# Patient Record
Sex: Male | Born: 1965
Health system: Southern US, Community
[De-identification: ages and names within clinical notes are randomized; demographics above are authoritative.]

## PROBLEM LIST (undated history)

## (undated) DIAGNOSIS — F419 Anxiety disorder, unspecified: Secondary | ICD-10-CM

## (undated) DIAGNOSIS — Z720 Tobacco use: Secondary | ICD-10-CM

## (undated) DIAGNOSIS — E559 Vitamin D deficiency, unspecified: Secondary | ICD-10-CM

## (undated) DIAGNOSIS — I639 Cerebral infarction, unspecified: Secondary | ICD-10-CM

## (undated) HISTORY — DX: Tobacco use: Z72.0

## (undated) HISTORY — DX: Vitamin D deficiency, unspecified: E55.9

## (undated) HISTORY — DX: Anxiety disorder, unspecified: F41.9

## (undated) HISTORY — DX: Cerebral infarction, unspecified: I63.9

## (undated) HISTORY — PX: FOOT SURGERY: SHX648

---

## 2003-10-25 ENCOUNTER — Emergency Department (HOSPITAL_COMMUNITY): Admission: EM | Admit: 2003-10-25 | Discharge: 2003-10-25 | Payer: Self-pay | Admitting: *Deleted

## 2003-12-03 ENCOUNTER — Emergency Department (HOSPITAL_COMMUNITY): Admission: EM | Admit: 2003-12-03 | Discharge: 2003-12-03 | Payer: Self-pay | Admitting: Emergency Medicine

## 2006-12-07 ENCOUNTER — Emergency Department (HOSPITAL_COMMUNITY): Admission: EM | Admit: 2006-12-07 | Discharge: 2006-12-07 | Payer: Self-pay | Admitting: Emergency Medicine

## 2011-05-05 ENCOUNTER — Emergency Department (HOSPITAL_COMMUNITY)
Admission: EM | Admit: 2011-05-05 | Discharge: 2011-05-05 | Disposition: A | Discharge status: Home / Self Care | Payer: Self-pay | Attending: Emergency Medicine | Admitting: Emergency Medicine

## 2011-05-05 DIAGNOSIS — X58XXXA Exposure to other specified factors, initial encounter: Secondary | ICD-10-CM | POA: Insufficient documentation

## 2011-05-05 DIAGNOSIS — T148XXA Other injury of unspecified body region, initial encounter: Secondary | ICD-10-CM | POA: Insufficient documentation

## 2011-05-05 DIAGNOSIS — R109 Unspecified abdominal pain: Secondary | ICD-10-CM | POA: Insufficient documentation

## 2011-05-05 LAB — URINALYSIS, ROUTINE W REFLEX MICROSCOPIC
Bilirubin Urine: NEGATIVE
Glucose, UA: NEGATIVE mg/dL
Hgb urine dipstick: NEGATIVE
Ketones, ur: NEGATIVE mg/dL
Leukocytes, UA: NEGATIVE
Nitrite: NEGATIVE
Protein, ur: NEGATIVE mg/dL
Specific Gravity, Urine: 1.011 (ref 1.005–1.030)
Urobilinogen, UA: 1 mg/dL (ref 0.0–1.0)
pH: 6 (ref 5.0–8.0)

## 2017-04-01 DIAGNOSIS — Z9889 Other specified postprocedural states: Secondary | ICD-10-CM

## 2017-04-01 HISTORY — DX: Other specified postprocedural states: Z98.890

## 2017-04-11 ENCOUNTER — Emergency Department (HOSPITAL_COMMUNITY): Payer: Self-pay

## 2017-04-11 ENCOUNTER — Inpatient Hospital Stay (HOSPITAL_COMMUNITY): Payer: Self-pay

## 2017-04-11 ENCOUNTER — Encounter (HOSPITAL_COMMUNITY): Payer: Self-pay

## 2017-04-11 ENCOUNTER — Inpatient Hospital Stay (HOSPITAL_COMMUNITY)
Admission: EM | Admit: 2017-04-11 | Discharge: 2017-04-13 | DRG: 042 | Disposition: A | Discharge status: Home / Self Care | Payer: Self-pay | Attending: Internal Medicine | Admitting: Internal Medicine

## 2017-04-11 DIAGNOSIS — F17210 Nicotine dependence, cigarettes, uncomplicated: Secondary | ICD-10-CM | POA: Diagnosis present

## 2017-04-11 DIAGNOSIS — E876 Hypokalemia: Secondary | ICD-10-CM | POA: Diagnosis present

## 2017-04-11 DIAGNOSIS — H538 Other visual disturbances: Secondary | ICD-10-CM | POA: Diagnosis present

## 2017-04-11 DIAGNOSIS — I779 Disorder of arteries and arterioles, unspecified: Secondary | ICD-10-CM | POA: Diagnosis present

## 2017-04-11 DIAGNOSIS — I6340 Cerebral infarction due to embolism of unspecified cerebral artery: Principal | ICD-10-CM | POA: Diagnosis present

## 2017-04-11 DIAGNOSIS — I739 Peripheral vascular disease, unspecified: Secondary | ICD-10-CM | POA: Diagnosis present

## 2017-04-11 DIAGNOSIS — Z720 Tobacco use: Secondary | ICD-10-CM | POA: Diagnosis present

## 2017-04-11 DIAGNOSIS — I6522 Occlusion and stenosis of left carotid artery: Secondary | ICD-10-CM | POA: Diagnosis present

## 2017-04-11 DIAGNOSIS — I6310 Cerebral infarction due to embolism of unspecified precerebral artery: Secondary | ICD-10-CM

## 2017-04-11 DIAGNOSIS — E785 Hyperlipidemia, unspecified: Secondary | ICD-10-CM | POA: Diagnosis present

## 2017-04-11 DIAGNOSIS — R200 Anesthesia of skin: Secondary | ICD-10-CM | POA: Diagnosis present

## 2017-04-11 DIAGNOSIS — I639 Cerebral infarction, unspecified: Secondary | ICD-10-CM | POA: Diagnosis present

## 2017-04-11 DIAGNOSIS — Z8249 Family history of ischemic heart disease and other diseases of the circulatory system: Secondary | ICD-10-CM

## 2017-04-11 DIAGNOSIS — Z833 Family history of diabetes mellitus: Secondary | ICD-10-CM

## 2017-04-11 DIAGNOSIS — Z8661 Personal history of infections of the central nervous system: Secondary | ICD-10-CM

## 2017-04-11 LAB — BASIC METABOLIC PANEL
Anion gap: 9 (ref 5–15)
BUN: 10 mg/dL (ref 6–20)
CHLORIDE: 101 mmol/L (ref 101–111)
CO2: 27 mmol/L (ref 22–32)
CREATININE: 1.18 mg/dL (ref 0.61–1.24)
Calcium: 8.6 mg/dL — ABNORMAL LOW (ref 8.9–10.3)
GFR calc Af Amer: >60 mL/min (ref >60)
GFR calc non Af Amer: >60 mL/min (ref >60)
Glucose, Bld: 71 mg/dL (ref 65–99)
Potassium: 3.3 mmol/L — ABNORMAL LOW (ref 3.5–5.1)
SODIUM: 137 mmol/L (ref 135–145)

## 2017-04-11 LAB — CBC WITH DIFFERENTIAL/PLATELET
Basophils Absolute: 0 10*3/uL (ref 0.0–0.1)
Basophils Relative: 0 %
EOS ABS: 0.1 10*3/uL (ref 0.0–0.7)
EOS PCT: 1 %
HCT: 39.8 % (ref 39.0–52.0)
Hemoglobin: 14.4 g/dL (ref 13.0–17.0)
LYMPHS ABS: 1.8 10*3/uL (ref 0.7–4.0)
Lymphocytes Relative: 24 %
MCH: 30.4 pg (ref 26.0–34.0)
MCHC: 36.2 g/dL — ABNORMAL HIGH (ref 30.0–36.0)
MCV: 84.1 fL (ref 78.0–100.0)
MONO ABS: 0.3 10*3/uL (ref 0.1–1.0)
MONOS PCT: 4 %
Neutro Abs: 5.2 10*3/uL (ref 1.7–7.7)
Neutrophils Relative %: 71 %
PLATELETS: 177 10*3/uL (ref 150–400)
RBC: 4.73 MIL/uL (ref 4.22–5.81)
RDW: 13.7 % (ref 11.5–15.5)
WBC: 7.4 10*3/uL (ref 4.0–10.5)

## 2017-04-11 MED ORDER — ACETAMINOPHEN 650 MG RE SUPP: 650.0000 mg | RECTAL | Status: DC | PRN

## 2017-04-11 MED ORDER — POTASSIUM CHLORIDE 10 MEQ/100ML IV SOLN
10.0000 meq | INTRAVENOUS | Status: AC
  Administered 2017-04-11 (×3): 10 meq via INTRAVENOUS
  Filled 2017-04-11 (×3): qty 100

## 2017-04-11 MED ORDER — ACETAMINOPHEN 160 MG/5ML PO SOLN: 650.0000 mg | ORAL | Status: DC | PRN

## 2017-04-11 MED ORDER — SODIUM CHLORIDE 0.9 % IV SOLN
INTRAVENOUS | Status: DC
  Administered 2017-04-11: 17:00:00 via INTRAVENOUS

## 2017-04-11 MED ORDER — STROKE: EARLY STAGES OF RECOVERY BOOK
Freq: Once | Status: DC
Start: 2017-04-11 — End: 2017-04-13

## 2017-04-11 MED ORDER — ACETAMINOPHEN 325 MG PO TABS: 650.0000 mg | ORAL_TABLET | ORAL | Status: DC | PRN

## 2017-04-11 MED ORDER — SENNOSIDES-DOCUSATE SODIUM 8.6-50 MG PO TABS
1.0000 | ORAL_TABLET | Freq: Every evening | ORAL | Status: DC | PRN
Start: 2017-04-11 — End: 2017-04-13

## 2017-04-11 MED ORDER — ASPIRIN EC 81 MG PO TBEC
81.0000 mg | DELAYED_RELEASE_TABLET | Freq: Every day | ORAL | Status: DC
  Administered 2017-04-11 – 2017-04-13 (×3): 81 mg via ORAL
  Filled 2017-04-11 (×3): qty 1

## 2017-04-11 NOTE — Progress Notes (Signed)
PT Cancellation Note  Patient Details Name: Daniel Stuart MRN: [id number] DOB: 1966-04-13   Cancelled Treatment:    Reason Eval/Treat Not Completed: Patient at procedure or test/unavailable.  Attempted to see patient x2 today.  In testing and RN working with patient.  Will return tomorrow for PT evaluation.   Vena AustriaSusan H Raveena Hebdon 04/11/2017, 8:44 PM Durenda HurtSusan H. Renaldo Fiddleravis, PT, [facility] Pager [phone]

## 2017-04-11 NOTE — Progress Notes (Signed)
OT Cancellation Note  Patient Details Name: Daniel Stuart MRN: [id number] DOB: 02/10/1966   Cancelled Treatment:    Reason Eval/Treat Not Completed: Patient at procedure or test/ unavailable. On OT arrival for evaluation, pt preparing to leave unit for testing. OT will check back as able for OT evaluation.   Doristine Sectionharity A Rob Mciver, MS OTR/L  Pager: [phone]   Doristine SectionCharity A Zak Gondek 04/11/2017, 5:16 PM

## 2017-04-11 NOTE — Consult Note (Signed)
Referring Physician: Dr. Charlies Silvers    Chief Complaint: Multifocal strokes on MRI  HPI: Daniel Stuart is an 51 y.o. male who initially presented to the [facility] ED for assessment of worsening headache, RUE numbness and intermittent blurry vision. His headache has been off and on for the past 2 months, and is present over the top of his head, rated at 6/10. He has had some trouble walking, which he attributes to RLE weakness. He also endorses dizziness that is non-vertiginous, feeling more like a sensation of lightheadedness. He denies confusion or difficulty with speech.   MRI revealed multifocal subcentimeter foci of restricted diffusion scattered within the cerebral hemispheres; the lesions are present at the cortical-juxtacortical interface, consistent with microemboli. Also seen on MRI is extensive diffuse T2 and FLAIR-hyperintense signal abnormality that appears chronic. 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 Also noted on MRI is chronic left ICA occlusion - a portion of the signal change on the MRI is felt by Radiology to represent old watershed infarctions.   He denies recent fevers, chills, rigors or other symptoms of infection.   He states that he has not seen a doctor in about 30 years. His only home medication is PRN ibuprofen.   History reviewed. No pertinent past medical history.  Past Surgical History:  Procedure Laterality Date  . FOOT SURGERY      Family History  Problem Relation Age of Onset  . Diabetes Mother   . Diabetes Father   . Heart failure Father    Social History:  reports that he has been smoking Cigarettes.  He has been smoking about 1.00 pack per day. He has never used smokeless tobacco. He reports that he does not drink alcohol or use drugs.  Allergies: No Known  Allergies  Medications:  Prior to Admission:  Prescriptions Prior to Admission  Medication Sig Dispense Refill Last Dose  . ibuprofen (ADVIL,MOTRIN) 200 MG tablet Take 400 mg by mouth every 6 (six) hours as needed.   04/10/2017 at Unknown time    ROS: No CP, SOB, abdominal pain. Denies yellowing of eyes. Denies hematuria. Other ROS as per HPI.   Physical Examination: Blood pressure 124/79, pulse 71, temperature 98 F (36.7 C), temperature source Oral, resp. rate 16, height 5' 6"  (1.676 m), weight 59 kg (130 lb), SpO2 99 %.  General: NAD.  HEENT: [place]/AT Ext: No edema  Neurologic Examination: Mental Status: Alert, fully oriented, thought content appropriate.  Pleasant and cooperative. Speech fluent without evidence of aphasia.  Had some difficulty with a directional 2-step command.  Cranial Nerves: II:  Visual fields intact, PERRL III,IV, VI: ptosis not present, EOMI without nystagmus V,VII: smile symmetric, facial temp sensation normal bilaterally VIII: hearing intact to conversation IX,X: palate rises symmetrically XI: Shoulder shrug is symmetric XII: midline tongue extension Motor: Right : Upper extremity   5/5    Left:     Upper extremity   5/5  Lower extremity   5/5     Lower extremity   5/5 Normal tone in upper extremities. Mildly increased tone in lower extremities. No atrophy noted Sensory: Temperature and light touch intact x 4. No extinction.  Deep Tendon Reflexes:  3+ bilateral upper extremities. 4+ left patella, 3+ right patella, 2+ achilles bilaterally Plantars: Equivocal bilaterally.  Cerebellar: No ataxia with FNF or  heel-shin bilaterally.  Gait: Deferred  Results for orders placed or performed during the hospital encounter of 04/11/17 (from the past 48 hour(s))  CBC with Differential/Platelet     Status: Abnormal   Collection Time: 04/11/17 10:16 AM  Result Value Ref Range   WBC 7.4 4.0 - 10.5 K/uL   RBC 4.73 4.22 - 5.81 MIL/uL   Hemoglobin 14.4 13.0 - 17.0  g/dL   HCT 39.8 39.0 - 52.0 %   MCV 84.1 78.0 - 100.0 fL   MCH 30.4 26.0 - 34.0 pg   MCHC 36.2 (H) 30.0 - 36.0 g/dL   RDW 13.7 11.5 - 15.5 %   Platelets 177 150 - 400 K/uL   Neutrophils Relative % 71 %   Neutro Abs 5.2 1.7 - 7.7 K/uL   Lymphocytes Relative 24 %   Lymphs Abs 1.8 0.7 - 4.0 K/uL   Monocytes Relative 4 %   Monocytes Absolute 0.3 0.1 - 1.0 K/uL   Eosinophils Relative 1 %   Eosinophils Absolute 0.1 0.0 - 0.7 K/uL   Basophils Relative 0 %   Basophils Absolute 0.0 0.0 - 0.1 K/uL  Basic metabolic panel     Status: Abnormal   Collection Time: 04/11/17 10:16 AM  Result Value Ref Range   Sodium 137 135 - 145 mmol/L   Potassium 3.3 (L) 3.5 - 5.1 mmol/L   Chloride 101 101 - 111 mmol/L   CO2 27 22 - 32 mmol/L   Glucose, Bld 71 65 - 99 mg/dL   BUN 10 6 - 20 mg/dL   Creatinine, Ser 1.18 0.61 - 1.24 mg/dL   Calcium 8.6 (L) 8.9 - 10.3 mg/dL   GFR calc non Af Amer >60 >60 mL/min   GFR calc Af Amer >60 >60 mL/min    Comment: (NOTE) The eGFR has been calculated using the CKD EPI equation. This calculation has not been validated in all clinical situations. eGFR's persistently <60 mL/min signify possible Chronic Kidney Disease.    Anion gap 9 5 - 15   Dg Chest 2 View  Result Date: 04/11/2017 CLINICAL DATA:  Code stroke EXAM: CHEST  2 VIEW COMPARISON:  None in PACs FINDINGS: The lungs are well-expanded. There is no focal infiltrate. There is no pleural effusion. The heart and pulmonary vascularity are normal. The mediastinum is normal in width. The trachea is midline. The bony thorax is unremarkable. IMPRESSION: There is no acute cardiopulmonary abnormality. Electronically Signed   By: David  Martinique M.D.   On: 04/11/2017 16:26   Mr Brain Wo Contrast (neuro Protocol)  Result Date: 04/11/2017 CLINICAL DATA:  Headache.  Right-sided numbness EXAM: MRI HEAD WITHOUT CONTRAST TECHNIQUE: Multiplanar, multiecho pulse sequences of the brain and surrounding structures were obtained without  intravenous contrast. COMPARISON:  None. FINDINGS: Brain: Multiple small areas of restricted diffusion in both cerebral hemispheres compatible with acute infarcts, likely embolic. These areas all measure under 1 cm. No infarct in the brainstem or cerebellum. Chronic infarcts in the frontal and parietal cortex and white matter bilaterally, with the appearance of watershed chronic infarction. Chronic infarcts in the occipital lobes bilaterally and bilateral cerebral white matter chronic infarcts. Posterior fossa normal. Negative for hemorrhage or mass. Ventricle size normal. No shift of the midline structures Vascular: Occlusion of the left internal carotid artery of unknown chronicity. Right internal carotid artery and basilar artery have normal flow voids. Skull and upper cervical spine: Negative Sinuses/Orbits: Negative Other: None IMPRESSION: Multiple small areas of acute infarct in both cerebral hemisphere most  likely due to emboli Occlusion of left internal carotid artery. Extensive chronic ischemia bilaterally. Probable chronic watershed infarct in both cerebral hemispheres. Electronically Signed   By: Franchot Gallo M.D.   On: 04/11/2017 12:25    Assessment: 51 y.o. male with probable multifocal acute cardioembolic strokes 1. Acute findings on MRI: Multifocal subcentimeter foci of restricted diffusion scattered within the cerebral hemispheres; the lesions are present at the cortical-juxtacortical interfaces, consistent with microemboli. A cardiac source is suspected. 2. Chronic findings on MRI: Extensive diffuse T2 and FLAIR-hyperintense signal abnormality that appears chronic. 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  3. Also noted on MRI is chronic left ICA occlusion - a portion of the signal change on the MRI is felt by Radiology to represent old watershed infarctions.   Plan: 1. Blood cultures x 2.  2. TTE  to assess for possible vegetation. If negative, obtain TEE.  3, MRA of the brain without contrast 4. MRA of neck with contrast.  5. PT consult, OT consult, Speech consult 6. Start ASA 81 mg po qd.  7. Given probable cardioembolic source for strokes, hold off on statin unless fasting lipid panel is abnormal.  8. Given the small size and probable mechanism of the infarctions, permissive HTN not likely to be of benefit.  9. Telemetry monitoring 10. Frequent neuro checks  @Electronically  signed: Dr. Kerney Elbe  04/11/2017, 7:16 PM

## 2017-04-11 NOTE — ED Provider Notes (Addendum)
[facility]-EMERGENCY DEPT Provider Note   CSN: [id number] Arrival date & time: 04/11/17  0848     History   Chief Complaint Chief Complaint  Patient presents with  . Migraine  . arm numbness    HPI Daniel Stuart is a 51 y.o. male.  Patient presents with the complaint of headache on top of his head as well as some visual changes some right arm numbness some left leg numbness and some coordination issues particularly with walking the symptoms have been present for 3 months. Headache is mild not severe. Patient seen by primary care provider for this. And they were planning MRI. Patient without any symptoms like this in the past.      History reviewed. No pertinent past medical history.  Patient Active Problem List   Diagnosis Date Noted  . CVA (cerebral vascular accident) (HCC) 04/11/2017    Past Surgical History:  Procedure Laterality Date  . FOOT SURGERY         Home Medications    Prior to Admission medications   Medication Sig Start Date End Date Taking? Authorizing Provider  ibuprofen (ADVIL,MOTRIN) 200 MG tablet Take 400 mg by mouth every 6 (six) hours as needed.   Yes [provider]    Family History Family History  Problem Relation Age of Onset  . Diabetes Mother   . Diabetes Father   . Heart failure Father     Social History Social History  Substance Use Topics  . Smoking status: Current Every Day Smoker    Packs/day: 1.00    Types: Cigarettes  . Smokeless tobacco: Never Used  . Alcohol use No     Allergies   Patient has no known allergies.   Review of Systems Review of Systems  Constitutional: Negative for fever.  HENT: Negative for congestion.   Eyes: Positive for visual disturbance.  Respiratory: Negative for shortness of breath.   Cardiovascular: Negative for chest pain.  Gastrointestinal: Negative for abdominal pain and nausea.  Genitourinary: Negative for dysuria.  Musculoskeletal: Negative for back pain.  Skin:  Positive for rash.  Neurological: Positive for speech difficulty, numbness and headaches.  Hematological: Does not bruise/bleed easily.  Psychiatric/Behavioral: Negative for confusion.     Physical Exam Updated Vital Signs BP 114/78   Pulse 60   Temp 97.9 F (36.6 C)   Resp 16   Ht 1.676 m (5\' 6" )   Wt 59 kg (130 lb)   SpO2 100%   BMI 20.98 kg/m   Physical Exam  Constitutional: He is oriented to person, place, and time. He appears well-developed and well-nourished. No distress.  HENT:  Head: Normocephalic and atraumatic.  Mouth/Throat: Oropharynx is clear and moist.  Eyes: Conjunctivae and EOM are normal. Pupils are equal, round, and reactive to light.  Neck: Normal range of motion. Neck supple.  Cardiovascular: Normal rate and regular rhythm.   Pulmonary/Chest: Effort normal and breath sounds normal. No respiratory distress.  Abdominal: Soft. Bowel sounds are normal. There is no tenderness.  Musculoskeletal: Normal range of motion. He exhibits no edema.  Neurological: He is alert and oriented to person, place, and time. No cranial nerve deficit or sensory deficit. He exhibits normal muscle tone.  Coordination slightly off.  Skin: Skin is warm.  Nursing note and vitals reviewed.    ED Treatments / Results  Labs (all labs ordered are listed, but only abnormal results are displayed) Labs Reviewed  CBC WITH DIFFERENTIAL/PLATELET - Abnormal; Notable for the following:  Result Value   MCHC 36.2 (*)    All other components within normal limits  BASIC METABOLIC PANEL - Abnormal; Notable for the following:    Potassium 3.3 (*)    Calcium 8.6 (*)    All other components within normal limits    EKG  EKG Interpretation  Date/Time:  Monday April 11 2017 13:07:55 EDT Ventricular Rate:  63 PR Interval:    QRS Duration: 86 QT Interval:  374 QTC Calculation: 377 R Axis:   -2 Text Interpretation:  Sinus rhythm RSR' in V1 or V2, right VCD or RVH Minimal ST elevation,  inferior leads Baseline wander in lead(s) III aVF V4 No previous ECGs available Confirmed by Vanetta MuldersZackowski, Debbera Wolken [phone]) on 04/11/2017 1:43:08 PM       Radiology Mr Brain Wo Contrast (neuro Protocol)  Result Date: 04/11/2017 CLINICAL DATA:  Headache.  Right-sided numbness EXAM: MRI HEAD WITHOUT CONTRAST TECHNIQUE: Multiplanar, multiecho pulse sequences of the brain and surrounding structures were obtained without intravenous contrast. COMPARISON:  None. FINDINGS: Brain: Multiple small areas of restricted diffusion in both cerebral hemispheres compatible with acute infarcts, likely embolic. These areas all measure under 1 cm. No infarct in the brainstem or cerebellum. Chronic infarcts in the frontal and parietal cortex and white matter bilaterally, with the appearance of watershed chronic infarction. Chronic infarcts in the occipital lobes bilaterally and bilateral cerebral white matter chronic infarcts. Posterior fossa normal. Negative for hemorrhage or mass. Ventricle size normal. No shift of the midline structures Vascular: Occlusion of the left internal carotid artery of unknown chronicity. Right internal carotid artery and basilar artery have normal flow voids. Skull and upper cervical spine: Negative Sinuses/Orbits: Negative Other: None IMPRESSION: Multiple small areas of acute infarct in both cerebral hemisphere most likely due to emboli Occlusion of left internal carotid artery. Extensive chronic ischemia bilaterally. Probable chronic watershed infarct in both cerebral hemispheres. Electronically Signed   By: Marlan Palauharles  Clark M.D.   On: 04/11/2017 12:25    Procedures Procedures (including critical care time)  Medications Ordered in ED Medications  potassium chloride 10 mEq in 100 mL IVPB (10 mEq Intravenous Transfusing/Transfer 04/11/17 1329)     Initial Impression / Assessment and Plan / ED Course  I have reviewed the triage vital signs and the nursing notes.  Pertinent labs & imaging results  that were available during my care of the patient were reviewed by me and considered in my medical decision making (see chart for details).     Patient's MRI shows evidence of old blood embolic acute infarcts. Discussed with the neurologist at [facility] they prefer patient to be transferred there. Hospitalist will arrange transfer and admission to the hospitalist team. Neurology will be available if needed.  Patient symptoms are several months long however MRI does show evidence of acute infarcts consistent with embolic phenomenon.  Final Clinical Impressions(s) / ED Diagnoses   Final diagnoses:  Cerebrovascular accident (CVA) due to embolism of precerebral artery [facility](HCC)    New Prescriptions New Prescriptions   No medications on file     Vanetta MuldersZackowski, Joesph Marcy, MD 04/11/17 1327    Vanetta MuldersZackowski, Nehal Witting, MD 04/11/17 1343

## 2017-04-11 NOTE — ED Triage Notes (Signed)
Patient c/o migraine headache on the top of his head that started yesterday.Patient denies blurred vision, N/V, and denies sensitivity to light. Patient also reports that he has been having intermittent numbness to the right arm x several weeks. Patient states sometimes when he straightens the arm out it jumps.  Patient added that he has been having issues with equilibrium and his PCP recommended an MRI, but his insurance does nt cover the MRI.

## 2017-04-11 NOTE — ED Notes (Signed)
Patient in MRI 

## 2017-04-11 NOTE — Progress Notes (Signed)
VASCULAR LAB PRELIMINARY  PRELIMINARY  PRELIMINARY  PRELIMINARY  Carotid duplex completed.    Preliminary report:  Right - No evidence of ICA stenosis. Vertebral artery flow is antegrade.. Left - Abnormal Doppler signal ( spiked ) in the bulb with loss of color flow and Doppler signal beyond that point. No plaque or embolus visualized. Cannot rule out a possible kink.  Courtny Bennison, RVS 04/11/2017, 5:03 PM Olen CordialGreg Collins, RVT 04/11/2017 5:03 PM

## 2017-04-11 NOTE — H&P (Signed)
History and Physical    Daniel Earthlyimothy E Sokol ZOX:[id number] DOB: 11-15-1965 DOA: 04/11/2017  Referring MD/NP/PA: Dr. Deretha EmoryZackowski  PCP: Dolan AmenBailey, Sarah M, [name]    Patient coming from: home   Chief Complaint: arm numbness, leg numbness  HPI: Daniel Stuart is a 51 y.o. male with no significant medical history, not on any medications. Patient presented to ED for evaluation of worsening headache, intermittent visual changes and numbness in his right arm. Patient reported his symptoms for several weeks to few months and thought that symptoms would resolve on their own however symptoms never subsided. Headache is located on top of the head and it is about 6 out of 10 in intensity, nonradiating and sometimes he gets blurry vision. Patient is taking ibuprofen as needed, this has not provided symptomatic relief. No associated auras. In regards to arm numbness patient reports this is still present and he also has intermittent numbness in his left leg which cause him to have little trouble with walking. No lightheadedness or loss of consciousness. No dizziness. No falls. No chest pain or shortness of breath or palpitations. No abdominal pain, nausea or vomiting. No urinary complaints.  ED Course: In ED, patient was hemodynamically stable. Blood work was relatively unremarkable except for potassium of 3.3 which was supplemented in ED. Chest x-ray showed no acute cardiopulmonary findings. MRI brain showed multiple small areas of acute infarct in cerebral hemispheres most likely due to emboli and occlusion of the left internal carotid artery. Per neurology, [facility].  Review of Systems:  Constitutional: Negative for fever, chills, diaphoresis, activity change, appetite change and fatigue.  HENT: Negative for ear pain, nosebleeds, congestion, facial swelling, rhinorrhea, neck pain, neck stiffness and ear discharge.   Eyes: Negative for pain, discharge, redness, itching  and visual disturbance.  Respiratory: Negative for cough, choking, chest tightness, shortness of breath, wheezing and stridor.   Cardiovascular: Negative for chest pain, palpitations and leg swelling.  Gastrointestinal: Negative for abdominal distention.  Genitourinary: Negative for dysuria, urgency, frequency, hematuria, flank pain, decreased urine volume, difficulty urinating and dyspareunia.  Musculoskeletal: Negative for back pain, joint swelling, arthralgias and gait problem.  Neurological: per HPI Hematological: Negative for adenopathy. Does not bruise/bleed easily.  Psychiatric/Behavioral: Negative for hallucinations, behavioral problems, confusion, dysphoric mood, decreased concentration and agitation.   History reviewed. No pertinent past medical history.  Past Surgical History:  Procedure Laterality Date  . FOOT SURGERY      Social history:  reports that he has been smoking Cigarettes.  He has been smoking about 1.00 pack per day. He has never used smokeless tobacco. He reports that he does not drink alcohol or use drugs.  Ambulation: Ambulates without assistance at baseline.  No Known Allergies  Family History  Problem Relation Age of Onset  . Diabetes Mother   . Diabetes Father   . Heart failure Father     Prior to Admission medications   Medication Sig Start Date End Date Taking? Authorizing Provider  ibuprofen (ADVIL,MOTRIN) 200 MG tablet Take 400 mg by mouth every 6 (six) hours as needed.   Yes [provider]    Physical Exam: Vitals:   04/11/17 0855 04/11/17 0911 04/11/17 1217  BP: 113/81  114/78  Pulse: 89  60  Resp: 18  16  Temp: 97.9 F (36.6 C)    SpO2: 98%  100%  Weight:  59 kg (130 lb)   Height:  5\' 6"  (1.676 m)  Constitutional: NAD, calm, comfortable Vitals:   04/11/17 0855 04/11/17 0911 04/11/17 1217  BP: 113/81  114/78  Pulse: 89  60  Resp: 18  16  Temp: 97.9 F (36.6 C)    SpO2: 98%  100%  Weight:  59 kg (130 lb)     Height:  5\' 6"  (1.676 m)    Eyes: PERRL, lids and conjunctivae normal ENMT: Mucous membranes are moist. Posterior pharynx clear of any exudate or lesions.Normal dentition.  Neck: normal, supple, no masses, no thyromegaly Respiratory: clear to auscultation bilaterally, no wheezing, no crackles. Normal respiratory effort. No accessory muscle use.  Cardiovascular: Regular rate and rhythm, no murmurs / rubs / gallops. No extremity edema. 2+ pedal pulses. No carotid bruits.  Abdomen: no tenderness, no masses palpated. No hepatosplenomegaly. Bowel sounds positive.  Musculoskeletal: no clubbing / cyanosis. No joint deformity upper and lower extremities. Good ROM, no contractures. Normal muscle tone.  Skin: no rashes, lesions, ulcers. No induration Neurologic: Alert and oriented to time, place and person, no cranial nerve deficit. Psychiatric: Normal judgment and insight. Alert and oriented x 3. Normal mood.   Labs on Admission: I have personally reviewed following labs and imaging studies  CBC:  Recent Labs Lab 04/11/17 1016  WBC 7.4  NEUTROABS 5.2  HGB 14.4  HCT 39.8  MCV 84.1  PLT 177   Basic Metabolic Panel:  Recent Labs Lab 04/11/17 1016  NA 137  K 3.3*  CL 101  CO2 27  GLUCOSE 71  BUN 10  CREATININE 1.18  CALCIUM 8.6*   GFR: Estimated Creatinine Clearance: 61.1 mL/min (by C-G formula based on SCr of 1.18 mg/dL). Liver Function Tests: No results for input(s): AST, ALT, ALKPHOS, BILITOT, PROT, ALBUMIN in the last 168 hours. No results for input(s): LIPASE, AMYLASE in the last 168 hours. No results for input(s): AMMONIA in the last 168 hours. Coagulation Profile: No results for input(s): INR, PROTIME in the last 168 hours. Cardiac Enzymes: No results for input(s): CKTOTAL, CKMB, CKMBINDEX, TROPONINI in the last 168 hours. BNP (last 3 results) No results for input(s): PROBNP in the last 8760 hours. HbA1C: No results for input(s): HGBA1C in the last 72  hours. CBG: No results for input(s): GLUCAP in the last 168 hours. Lipid Profile: No results for input(s): CHOL, HDL, LDLCALC, TRIG, CHOLHDL, LDLDIRECT in the last 72 hours. Thyroid Function Tests: No results for input(s): TSH, T4TOTAL, FREET4, T3FREE, THYROIDAB in the last 72 hours. Anemia Panel: No results for input(s): VITAMINB12, FOLATE, FERRITIN, TIBC, IRON, RETICCTPCT in the last 72 hours. Urine analysis:    Component Value Date/Time   COLORURINE YELLOW 05/05/2011 1352   APPEARANCEUR CLEAR 05/05/2011 1352   LABSPEC 1.011 05/05/2011 1352   PHURINE 6.0 05/05/2011 1352   GLUCOSEU NEGATIVE 05/05/2011 1352   HGBUR NEGATIVE 05/05/2011 1352   BILIRUBINUR NEGATIVE 05/05/2011 1352   KETONESUR NEGATIVE 05/05/2011 1352   PROTEINUR NEGATIVE 05/05/2011 1352   UROBILINOGEN 1.0 05/05/2011 1352   NITRITE NEGATIVE 05/05/2011 1352   LEUKOCYTESUR NEGATIVE 05/05/2011 1352   Sepsis Labs: @LABRCNTIP (procalcitonin:4,lacticidven:4) )No results found for this or any previous visit (from the past 240 hour(s)).   Radiological Exams on Admission: Mr Brain Wo Contrast (neuro Protocol)  Result Date: 04/11/2017 CLINICAL DATA:  Headache.  Right-sided numbness EXAM: MRI HEAD WITHOUT CONTRAST TECHNIQUE: Multiplanar, multiecho pulse sequences of the brain and surrounding structures were obtained without intravenous contrast. COMPARISON:  None. FINDINGS: Brain: Multiple small areas of restricted diffusion in both cerebral hemispheres compatible with acute infarcts,  likely embolic. These areas all measure under 1 cm. No infarct in the brainstem or cerebellum. Chronic infarcts in the frontal and parietal cortex and white matter bilaterally, with the appearance of watershed chronic infarction. Chronic infarcts in the occipital lobes bilaterally and bilateral cerebral white matter chronic infarcts. Posterior fossa normal. Negative for hemorrhage or mass. Ventricle size normal. No shift of the midline structures  Vascular: Occlusion of the left internal carotid artery of unknown chronicity. Right internal carotid artery and basilar artery have normal flow voids. Skull and upper cervical spine: Negative Sinuses/Orbits: Negative Other: None IMPRESSION: Multiple small areas of acute infarct in both cerebral hemisphere most likely due to emboli Occlusion of left internal carotid artery. Extensive chronic ischemia bilaterally. Probable chronic watershed infarct in both cerebral hemispheres. Electronically Signed   By: Marlan Palau M.D.   On: 04/11/2017 12:25    EKG: Pending  Assessment/Plan  Active Problems: Acute CVA in both cerebral hemispheres likely secondary to emboli from occlusion of the left internal carotid artery / right arm numbness / left leg numbness Stroke work up initiated:  - Aspirin daily - MRI brain - multiple small areas of acute infarct in both cerebral hemispheres most likely due to emboli occlusion of left internal carotid artery, extensive chronic ischemia bilaterally, probable chronic watershed infarct in both cerebral hemispheres - 2D ECHO - pending  - Carotid doppler - pending  - HgbA1c, Lipid panel - pending. LDL goal < 100. Patient not on statin therapy  - Diet: NPO until swallow evaluation completed  - Therapy: PT/OT - Appreciate neurology following   DVT prophylaxis: SCD's Code Status: full code Family Communication: no family at the bedside Disposition Plan: admission to telemetry to [facility]  Consults called: Neurology   Disposition plan: Further plan will depend as patient's clinical course evolves and further radiologic and laboratory data become available.    At the time of admission, it appears that the appropriate admission status for this patient is INPATIENT .Thisis judged to be reasonable and necessary in order to provide the required intensity of service to ensure the patient's safetygiven the patient presentation of acute infarct in addition to  physical exam findings, radiographic and laboratory data in the context of chronic comorbidities.  Manson Passey MD [facility] Pager [phone]  If 7PM-7AM, please contact night-coverage [email or web address] Password [facility]  04/11/2017, 2:04 PM

## 2017-04-12 ENCOUNTER — Encounter (HOSPITAL_COMMUNITY): Payer: Self-pay | Admitting: Family Medicine

## 2017-04-12 DIAGNOSIS — I779 Disorder of arteries and arterioles, unspecified: Secondary | ICD-10-CM | POA: Diagnosis present

## 2017-04-12 DIAGNOSIS — I739 Peripheral vascular disease, unspecified: Secondary | ICD-10-CM | POA: Diagnosis present

## 2017-04-12 DIAGNOSIS — I6310 Cerebral infarction due to embolism of unspecified precerebral artery: Secondary | ICD-10-CM

## 2017-04-12 DIAGNOSIS — E785 Hyperlipidemia, unspecified: Secondary | ICD-10-CM | POA: Diagnosis present

## 2017-04-12 DIAGNOSIS — Z720 Tobacco use: Secondary | ICD-10-CM | POA: Diagnosis present

## 2017-04-12 LAB — VAS US CAROTID
LCCAPDIAS: 9 cm/s
LCCAPSYS: 72 cm/s
LEFT ECA DIAS: -10 cm/s
LEFT VERTEBRAL DIAS: -11 cm/s
Left CCA dist dias: -11 cm/s
Left CCA dist sys: -75 cm/s
RIGHT ECA DIAS: -10 cm/s
RIGHT VERTEBRAL DIAS: -16 cm/s
Right CCA prox dias: 26 cm/s
Right CCA prox sys: 116 cm/s
Right cca dist sys: -62 cm/s

## 2017-04-12 LAB — LIPID PANEL
CHOLESTEROL: 191 mg/dL (ref 0–200)
HDL: 38 mg/dL — ABNORMAL LOW (ref >40)
LDL Cholesterol: 138 mg/dL — ABNORMAL HIGH (ref 0–99)
TRIGLYCERIDES: 77 mg/dL (ref <150)
Total CHOL/HDL Ratio: 5 RATIO
VLDL: 15 mg/dL (ref 0–40)

## 2017-04-12 MED ORDER — ATORVASTATIN CALCIUM 40 MG PO TABS: 40.0000 mg | ORAL_TABLET | Freq: Every day | ORAL | Status: DC

## 2017-04-12 MED ORDER — ATORVASTATIN CALCIUM 80 MG PO TABS
80.0000 mg | ORAL_TABLET | Freq: Every day | ORAL | Status: DC
  Administered 2017-04-12 – 2017-04-13 (×2): 80 mg via ORAL
  Filled 2017-04-12 (×2): qty 1

## 2017-04-12 NOTE — Evaluation (Signed)
Occupational Therapy Evaluation Patient Details Name: Daniel Stuart MRN: [id number] DOB: January 08, 1966 Today's Date: 04/12/2017    History of Present Illness Pt is a 51 y/o male with no pertinent PMH. He presents to the ED with headache and R arm numbness. MRI revealed multiple small areas of acute infarct in both cerebral hemispheres most likely due to emboli.    Clinical Impression   PTA, pt was independent with ADL and functional mobility. He currently requires overall supervision for safety with ADL. He presents with shuffling gait, poor motor planning skills, and decreased cognition impacting [facility] ability [facility] He would benefit from continued OT services while admitted to improve independence with ADL and IADL tasks. Recommend outpatient OT services for follow-up to address motor planning and cognitive deficits related to IADL participation.     Follow Up Recommendations  Outpatient OT;Supervision - Intermittent    Equipment Recommendations  Tub/shower seat    Recommendations for Other Services       Precautions / Restrictions Precautions Precautions: Fall Restrictions Weight Bearing Restrictions: No      Mobility Bed Mobility Overal bed mobility: Modified Independent                Transfers Overall transfer level: Needs assistance Equipment used: None Transfers: Sit to/from Stand Sit to Stand: Supervision         General transfer comment: Supervision for safety.     Balance Overall balance assessment: Needs assistance Sitting-balance support: Feet supported;No upper extremity supported Sitting balance-Leahy Scale: Good     Standing balance support: No upper extremity supported;During functional activity Standing balance-Leahy Scale: Fair                             ADL either performed or assessed with clinical judgement   ADL Overall ADL's : Needs assistance/impaired Eating/Feeding: Sitting;Supervision/  safety   Grooming: Supervision/safety;Standing   Upper Body Bathing: Supervision/ safety;Sitting   Lower Body Bathing: Supervison/ safety;Sit to/from stand   Upper Body Dressing : Supervision/safety;Sitting   Lower Body Dressing: Supervision/safety;Sit to/from stand   Toilet Transfer: Supervision/safety;Ambulation   Toileting- Clothing Manipulation and Hygiene: Supervision/safety;Sit to/from stand       Functional mobility during ADLs: Supervision/safety General ADL Comments: Pt with poor motor planning and shuffling gait during functional mobility. Cognitive deficits limiting participation with IADL.     Vision Baseline Vision/History: Wears glasses Wears Glasses: Reading only Patient Visual Report: No change from baseline Vision Assessment?: Yes Eye Alignment: Within Functional Limits Ocular Range of Motion: Within Functional Limits Alignment/Gaze Preference: Within Defined Limits Tracking/Visual Pursuits:  (Decreased ability to hold eye position at end ranges. ) Visual Fields: No apparent deficits Additional Comments: Pt with decreased visual attention during gaze to the R and L of midline.      Perception     Praxis Praxis Praxis tested?: Deficits Deficits: Ideomotor;Limb apraxia;Organization    Pertinent Vitals/Pain Pain Assessment: No/denies pain     Hand Dominance Right   Extremity/Trunk Assessment Upper Extremity Assessment Upper Extremity Assessment: LUE deficits/detail;RUE deficits/detail;[facility] for tasks assessed RUE Deficits / Details: Slight weakness as compared to R (4+/5 grossly). Reports periods of numbness but not present during session.  LUE Deficits / Details: Slight give with MMT with decreased ability to sustain position for extended period of time.     Lower Extremity Assessment Lower Extremity Assessment: Overall WFL for tasks assessed   Cervical / Trunk Assessment Cervical /  Trunk Assessment: Normal   Communication  Communication Communication: No difficulties   Cognition Arousal/Alertness: Awake/alert Behavior During Therapy: WFL for tasks assessed/performed Overall Cognitive Status: Impaired/Different from baseline Area of Impairment: Attention;Memory;Awareness;Problem solving                   Current Attention Level: Selective Memory: Decreased short-term memory     Awareness: Emergent Problem Solving: Slow processing;Decreased initiation;Difficulty sequencing;Requires verbal cues General Comments: Pt with difficulty problem solving need to use map during simple pathfinding activity in hallway. As distractions increased, attention broke down causing difficulty with higher level processing tasks.    General Comments       Exercises     Shoulder Instructions      Home Living Family/patient expects to be discharged to:: Private residence Living Arrangements: Parent Available Help at Discharge: Family;Available PRN/intermittently (Mother works for 4 hours during the day) Type of Home: Apartment Home Access: Stairs to enter Secretary/administrator of Steps: 3 Entrance Stairs-Rails: Right Home Layout: One level     Bathroom Shower/Tub: Chief Strategy Officer: Standard Bathroom Accessibility: Yes   Home Equipment: Gilmer Mor - single point      Lives With: Family (mother)    Prior Functioning/Environment Level of Independence: Independent        Comments: Drives but does not work. Community ambulator.         OT Problem List: Impaired balance (sitting and/or standing);Decreased safety awareness;Decreased knowledge of use of DME or AE;Decreased knowledge of precautions;Pain;Decreased cognition;Decreased coordination      OT Treatment/Interventions: Self-care/ADL training;Therapeutic exercise;Energy conservation;DME and/or AE instruction;Therapeutic activities;Cognitive remediation/compensation;Patient/family education;Balance training    OT Goals(Current goals  can be found in the care plan section) Acute Rehab OT Goals Patient Stated Goal: Return to PLOF OT Goal Formulation: With patient/family Time For Goal Achievement: 04/26/17 Potential to Achieve Goals: Good ADL Goals Pt Will Perform Grooming: with modified independence;standing Additional ADL Goal #1: Pt will demonstrate anticipatory awareness during morning ADL routine in a moderately distracting environment.  Additional ADL Goal #2: Pt will complete pathfinding task in hallway with no more than 1 VC for problem solving.  Additional ADL Goal #3: Pt will complete simple financial management task with no more than 1 VC in a quiet environment.   OT Frequency: Min 2X/week   Barriers to D/C:            Co-evaluation              AM-PAC PT "6 Clicks" Daily Activity     Outcome Measure Help from another person eating meals?: A Little Help from another person taking care of personal grooming?: A Little Help from another person toileting, which includes using toliet, bedpan, or urinal?: A Little Help from another person bathing (including washing, rinsing, drying)?: A Little Help from another person to put on and taking off regular upper body clothing?: A Little Help from another person to put on and taking off regular lower body clothing?: A Little 6 Click Score: 18   End of Session Equipment Utilized During Treatment: Gait belt Nurse Communication: Mobility status (D/C recommendations)  Activity Tolerance: Patient tolerated treatment well Patient left: in bed;with family/visitor present  OT Visit Diagnosis: Unsteadiness on feet (R26.81);Other symptoms and signs involving cognitive function                Time: 1410-1425 OT Time Calculation (min): 15 min Charges:  OT General Charges $OT Visit: 1 Procedure OT Evaluation $OT Eval Moderate Complexity:  1 Procedure G-Codes:     Doristine Section, MS OTR/L  Pager: [phone]   Dawsen Krieger A Kyrsten Deleeuw 04/12/2017, 5:22 PM

## 2017-04-12 NOTE — Evaluation (Signed)
Physical Therapy Evaluation Patient Details Name: Daniel Stuart MRN: [id number] DOB: 1966/03/14 Today's Date: 04/12/2017   History of Present Illness  Pt is a 51 y/o male with no pertinent PMH. He presents to the ED with headache and R arm numbness. MRI revealed multiple small areas of acute infarct in both cerebral hemispheres most likely due to emboli.   Clinical Impression  Pt admitted with above diagnosis. Pt currently with functional limitations due to the deficits listed below (see PT Problem List). At the time of PT eval pt was able to perform transfers and ambulation with gross min guard to supervision for safety. Pt currently has 5/5 strength in BLE's with no reported N/T, however with ambulation is demonstrating a shuffling gait pattern that is almost Parkinsonian in nature. Is able to make corrective changes and improve his gait pattern, however is not able to maintain those changes. Pt will benefit from skilled PT to increase their independence and safety with mobility to allow discharge to the venue listed below.       Follow Up Recommendations Outpatient PT;Supervision for mobility/OOB    Equipment Recommendations  None recommended by PT    Recommendations for Other Services       Precautions / Restrictions Precautions Precautions: Fall Restrictions Weight Bearing Restrictions: No      Mobility  Bed Mobility Overal bed mobility: Modified Independent             General bed mobility comments: No assist to transition to EOB. Increased time to negotiate sheets from around feet.  Transfers Overall transfer level: Needs assistance Equipment used: None Transfers: Sit to/from Stand Sit to Stand: Supervision         General transfer comment: Light supervision provided to power-up to full stand. Appeared mildly unsteady but no assist was required for pt to gain/maintain balance.   Ambulation/Gait Ambulation/Gait assistance: Min assist Ambulation Distance  (Feet): 150 Feet Assistive device: 1 person hand held assist Gait Pattern/deviations: Step-through pattern;Decreased stride length;Shuffle;Narrow base of support Gait velocity: Decreased Gait velocity interpretation: Below normal speed for age/gender General Gait Details: Decreased knee flexion and short step/stride length noted. When pt ambulating naturally, he appeared stiff legged and almost parkinsonian. Was able to make corrective changes with VC's and achieve a more typical gait pattern. HHA provided in addition to railing use at times. At other times pt ambulating without UE assist.  Stairs            Wheelchair Mobility    Modified Rankin (Stroke Patients Only) Modified Rankin (Stroke Patients Only) Pre-Morbid Rankin Score: No symptoms Modified Rankin: Slight disability     Balance Overall balance assessment: Needs assistance Sitting-balance support: Feet supported;No upper extremity supported Sitting balance-Leahy Scale: Good     Standing balance support: No upper extremity supported;During functional activity Standing balance-Leahy Scale: Fair                               Pertinent Vitals/Pain Pain Assessment: 0-10 Pain Score: 4  Pain Location: R knee Pain Descriptors / Indicators: Aching Pain Intervention(s): Limited activity within patient's tolerance;Monitored during session;Repositioned    Home Living Family/patient expects to be discharged to:: Private residence Living Arrangements: Parent Available Help at Discharge: Family;Available PRN/intermittently (Mother works for 4 hours during the day) Type of Home: Apartment Home Access: Stairs to enter Entrance Stairs-Rails: Right Entrance Stairs-Number of Steps: 3 Home Layout: One level Home Equipment: Cane - single point  Prior Function Level of Independence: Independent         Comments: Drives but does not work. Community ambulator.      Hand Dominance   Dominant Hand:  Right    Extremity/Trunk Assessment   Upper Extremity Assessment Upper Extremity Assessment: Defer to OT evaluation    Lower Extremity Assessment Lower Extremity Assessment: Overall WFL for tasks assessed (5/5 strength bilaterally with MMT. Denies N/T)    Cervical / Trunk Assessment Cervical / Trunk Assessment: Normal  Communication   Communication: No difficulties  Cognition Arousal/Alertness: Awake/alert Behavior During Therapy: WFL for tasks assessed/performed Overall Cognitive Status: Within Functional Limits for tasks assessed                                        General Comments      Exercises     Assessment/Plan    PT Assessment Patient needs continued PT services  PT Problem List Decreased strength;Decreased range of motion;Decreased activity tolerance;Decreased balance;Decreased mobility;Decreased knowledge of use of DME;Decreased safety awareness;Decreased knowledge of precautions;Pain       PT Treatment Interventions DME instruction;Gait training;Stair training;Functional mobility training;Therapeutic activities;Therapeutic exercise;Neuromuscular re-education;Patient/family education    PT Goals (Current goals can be found in the Care Plan section)  Acute Rehab PT Goals Patient Stated Goal: Return to PLOF PT Goal Formulation: With patient Time For Goal Achievement: 04/19/17 Potential to Achieve Goals: Good    Frequency Min 4X/week   Barriers to discharge        Co-evaluation               AM-PAC PT "6 Clicks" Daily Activity  Outcome Measure Difficulty turning over in bed (including adjusting bedclothes, sheets and blankets)?: None Difficulty moving from lying on back to sitting on the side of the bed? : None Difficulty sitting down on and standing up from a chair with arms (e.g., wheelchair, bedside commode, etc,.)?: A Little Help needed moving to and from a bed to chair (including a wheelchair)?: A Little Help needed  walking in hospital room?: A Little Help needed climbing 3-5 steps with a railing? : A Lot 6 Click Score: 19    End of Session Equipment Utilized During Treatment: Gait belt Activity Tolerance: Patient tolerated treatment well Patient left: in chair;with call bell/phone within reach;with chair alarm set Nurse Communication: Mobility status PT Visit Diagnosis: Unsteadiness on feet (R26.81);Other symptoms and signs involving the nervous system (R29.898)    Time: 1610-9604 PT Time Calculation (min) (ACUTE ONLY): 26 min   Charges:   PT Evaluation $PT Eval Moderate Complexity: 1 Procedure PT Treatments $Gait Training: 8-22 mins   PT G Codes:        Conni Slipper, PT, DPT Acute Rehabilitation Services Pager: [phone]   Marylynn Pearson 04/12/2017, 1:06 PM

## 2017-04-12 NOTE — Care Management Note (Signed)
Case Management Note  Patient Details  Name: Daniel Earthlyimothy E Stuart MRN: [id number] Date of Birth: 08/18/1966  Subjective/Objective:   Pt admitted with CVA. He is from home.   Pt does not have insurance and his PCP is leaving the practice he has been going to.                Action/Plan: [facility] He [facility]. CM was able to obtain him an appointment [facility]. CM informed him about the use of the pharmacy to assist with the cost of medications.  CM continuing to follow for further d/c needs.   Expected Discharge Date:   (unknown)               Expected Discharge Plan:     In-House Referral:     Discharge planning Services     Post Acute Care Choice:    Choice offered to:     DME Arranged:    DME Agency:     HH Arranged:    HH Agency:     Status of Service:  In process, will continue to follow  If discussed at Long Length of Stay Meetings, dates discussed:    Additional Comments:  Kermit BaloKelli F Loann Chahal, RN 04/12/2017, 11:14 AM

## 2017-04-12 NOTE — Progress Notes (Signed)
PROGRESS NOTE    Daniel Stuart  WUJ:[id number]  DOB: 05/21/1966  DOA: 04/11/2017 PCP: Dolan Amen, [name]  Brief Admission Hx: Daniel Stuart is a 51 y.o. male with no significant medical history, not on any medications. Patient presented to ED for evaluation of worsening headache, intermittent visual changes and numbness in his right arm. Patient reported his symptoms for several weeks to few months and thought that symptoms would resolve on their own however symptoms never subsided. Headache is located on top of the head and it is about 6 out of 10 in intensity, nonradiating and sometimes he gets blurry vision. Patient is taking ibuprofen as needed, this has not provided symptomatic relief. No associated auras. In regards to arm numbness patient reports this is still present and he also has intermittent numbness in his left leg which cause him to have little trouble with walking. No lightheadedness or loss of consciousness.   MDM/Assessment & Plan:   Acute CVA in both cerebral hemispheres likely secondary to emboli from occlusion of the left internal carotid artery / right arm numbness / left leg numbness - Continue daily aspirin for antiplatelet therapy - I discussed with Dr. Pearlean Brownie, will plan for TEE tomorrow, I left a message with cardiology scheduler [name] to arrange for TEE tomorrow. - Carotid doppler study pending.  - LDL is suboptimal, lipitor 80 mg daily - Daniel Stuart/OT evaluation - Swallow evaluation - Heart healthy diet - A1c pending, neuro checks - Cardiac monitoring (Continuous)  Tobacco Abuse - declined nicotine patch, counseled at bedside to stop   Hypokalemia - repleted IV, recheck in AM.   DVT prophylaxis: SCD Code Status: Full  Family Communication: none present Disposition Plan: Home    Consultants:  Neurology stroke team  Subjective: Daniel Stuart says that symptoms haven't improved much.    Objective: Vitals:   04/12/17 0030 04/12/17 0230 04/12/17 0430 04/12/17  0950  BP: 100/67 100/70 95/64 99/61   Pulse: 62 72 63 66  Resp: 15 15 16 20   Temp: 97.6 F (36.4 C) 97.8 F (36.6 C) 98 F (36.7 C) 98.2 F (36.8 C)  TempSrc: Oral Oral Oral Oral  SpO2: 100% 100% 100% 97%  Weight:      Height:        Intake/Output Summary (Last 24 hours) at 04/12/17 1411 Last data filed at 04/12/17 1249  Gross per 24 hour  Intake           753.33 ml  Output                0 ml  Net           753.33 ml   Filed Weights   04/11/17 0911  Weight: 59 kg (130 lb)     REVIEW OF SYSTEMS  As per history otherwise all reviewed and reported negative  Exam:  General exam: awake, alert, NAD. Cooperative.   Respiratory system: Clear. No increased work of breathing. Cardiovascular system: S1 & S2 heard. No JVD, murmurs, gallops, clicks or pedal edema. Gastrointestinal system: Abdomen is nondistended, soft and nontender. Normal bowel sounds heard. Central nervous system: Alert and oriented. Nonfocal.  Extremities: no CCE.  Data Reviewed: Basic Metabolic Panel:  Recent Labs Lab 04/11/17 1016  NA 137  K 3.3*  CL 101  CO2 27  GLUCOSE 71  BUN 10  CREATININE 1.18  CALCIUM 8.6*   Liver Function Tests: No results for input(s): AST, ALT, ALKPHOS, BILITOT, PROT, ALBUMIN in the last 168 hours. No results  for input(s): LIPASE, AMYLASE in the last 168 hours. No results for input(s): AMMONIA in the last 168 hours. CBC:  Recent Labs Lab 04/11/17 1016  WBC 7.4  NEUTROABS 5.2  HGB 14.4  HCT 39.8  MCV 84.1  PLT 177   Cardiac Enzymes: No results for input(s): CKTOTAL, CKMB, CKMBINDEX, TROPONINI in the last 168 hours. CBG (last 3)  No results for input(s): GLUCAP in the last 72 hours. No results found for this or any previous visit (from the past 240 hour(s)).   Studies: Dg Chest 2 View  Result Date: 04/11/2017 CLINICAL DATA:  Code stroke EXAM: CHEST  2 VIEW COMPARISON:  None in PACs FINDINGS: The lungs are well-expanded. There is no focal infiltrate.  There is no pleural effusion. The heart and pulmonary vascularity are normal. The mediastinum is normal in width. The trachea is midline. The bony thorax is unremarkable. IMPRESSION: There is no acute cardiopulmonary abnormality. Electronically Signed   By: David  SwazilandJordan M.D.   On: 04/11/2017 16:26   Mr Brain Wo Contrast (neuro Protocol)  Result Date: 04/11/2017 CLINICAL DATA:  Headache.  Right-sided numbness EXAM: MRI HEAD WITHOUT CONTRAST TECHNIQUE: Multiplanar, multiecho pulse sequences of the brain and surrounding structures were obtained without intravenous contrast. COMPARISON:  None. FINDINGS: Brain: Multiple small areas of restricted diffusion in both cerebral hemispheres compatible with acute infarcts, likely embolic. These areas all measure under 1 cm. No infarct in the brainstem or cerebellum. Chronic infarcts in the frontal and parietal cortex and white matter bilaterally, with the appearance of watershed chronic infarction. Chronic infarcts in the occipital lobes bilaterally and bilateral cerebral white matter chronic infarcts. Posterior fossa normal. Negative for hemorrhage or mass. Ventricle size normal. No shift of the midline structures Vascular: Occlusion of the left internal carotid artery of unknown chronicity. Right internal carotid artery and basilar artery have normal flow voids. Skull and upper cervical spine: Negative Sinuses/Orbits: Negative Other: None IMPRESSION: Multiple small areas of acute infarct in both cerebral hemisphere most likely due to emboli Occlusion of left internal carotid artery. Extensive chronic ischemia bilaterally. Probable chronic watershed infarct in both cerebral hemispheres. Electronically Signed   By: Marlan Palauharles  Clark M.D.   On: 04/11/2017 12:25   Mr Maxine GlennMra Head/brain XLWo Cm  Result Date: 04/11/2017 CLINICAL DATA:  Headache and right-sided numbness EXAM: MRA HEAD WITHOUT CONTRAST TECHNIQUE: Angiographic images of the Circle of Willis were obtained using MRA  technique without intravenous contrast. COMPARISON:  Brain MRI 04/11/2017 FINDINGS: Intracranial internal carotid arteries: The left internal carotid artery is occluded. There is reconstitution of left ICA flow related enhancement at the carotid terminus. Anterior cerebral arteries: Normal. Middle cerebral arteries: Normal. Posterior communicating arteries: Present bilaterally. Posterior cerebral arteries: Normal. Basilar artery: Normal. Vertebral arteries: Left dominant. Normal. Superior cerebellar arteries: Normal. Anterior inferior cerebellar arteries: Normal. Posterior inferior cerebellar arteries: Normal. IMPRESSION: 1. Occlusion of the left internal carotid artery with flow related enhancement at the carotid terminus and normal appearance of the left anterior and middle cerebral arteries, secondary to collateral flow across the complete circle of Willis. 2. Otherwise normal intracranial MRA. Electronically Signed   By: Deatra RobinsonKevin  Herman M.D.   On: 04/11/2017 20:10   Scheduled Meds: .  stroke: mapping our early stages of recovery book   Does not apply Once  . aspirin EC  81 mg Oral Daily  . atorvastatin  80 mg Oral q1800   Continuous Infusions: . sodium chloride 50 mL/hr at 04/11/17 1644   Active Problems:  CVA (cerebral vascular accident) [facility])  Time spent:   Standley Dakins, MD, FAAFP [facility] Pager [phone]  If 7PM-7AM, please contact night-coverage [email or web address] Password [email or web address] 04/12/2017, 2:11 PM    LOS: 1 day

## 2017-04-12 NOTE — Evaluation (Signed)
Speech Language Pathology Evaluation Patient Details Name: Larina Earthlyimothy E Easler MRN: [id number] DOB: 1965/12/12 Today's Date: 04/12/2017 Time: [id number] SLP Time Calculation (min) (ACUTE ONLY): 30 min  Problem List:  Patient Active Problem List   Diagnosis Date Noted  . CVA (cerebral vascular accident) (HCC) 04/11/2017   Past Medical History: History reviewed. No pertinent past medical history. Past Surgical History:  Past Surgical History:  Procedure Laterality Date  . FOOT SURGERY     HPI:  Pt is a 51 y/o male with no pertinent PMH. He presents to the ED with headache and R arm numbness. MRI revealed multiple small areas of acute infarct in both cerebral hemispheres most likely due to emboli.    Assessment / Plan / Recommendation Clinical Impression  Pt scored a 24/30 on the MOCA (normal = 26 or above), indicative of mild cognitive impairment. Heis difficulty lies in the areas of working memory, complex problem solving, and sustained attention, with an overall slower processing speed. Given his high level of independence PTA, recommend OP SLP f/u upon d/c.    SLP Assessment  SLP Recommendation/Assessment: Patient needs continued Speech Lanaguage Pathology Services SLP Visit Diagnosis: Cognitive communication deficit (R41.841)    Follow Up Recommendations  Outpatient SLP;Other (comment) (intermittent supervision)    Frequency and Duration min 2x/week  2 weeks      SLP Evaluation Cognition  Overall Cognitive Status: Impaired/Different from baseline Arousal/Alertness: Awake/alert Orientation Level: Oriented X4 Attention: Sustained Sustained Attention: Impaired Sustained Attention Impairment: Verbal complex Memory: Impaired Memory Impairment: Other (comment) (working memory) Awareness: Appears intact Problem Solving: Impaired Problem Solving Impairment: Verbal complex;Functional complex Safety/Judgment: Appears intact       Comprehension  Auditory Comprehension Overall  Auditory Comprehension: Appears within functional limits for tasks assessed    Expression Expression Primary Mode of Expression: Verbal Verbal Expression Overall Verbal Expression: Appears within functional limits for tasks assessed Written Expression Dominant Hand: Right   Oral / Motor  Motor Speech Overall Motor Speech: Appears within functional limits for tasks assessed   GO                    Maxcine Hamaiewonsky, Falan Hensler 04/12/2017, 2:14 PM   Maxcine HamLaura Paiewonsky, M.A. CCC-SLP [phone]

## 2017-04-12 NOTE — Progress Notes (Signed)
STROKE TEAM PROGRESS NOTE   HISTORY OF PRESENT ILLNESS (per record) Daniel Stuart is an 51 y.o. male with no significant past medical history who presented with headache, RUE numbness, blurred vision.  The patient endorses recurrent headache RLE weakness, and non-vertiginous dizziness for the past two months.  MRI demonstrated diffuse microemboli with possible chronic underlying watershed infarcts and a chronic left ICA occlusion.   SUBJECTIVE (INTERVAL HISTORY) Patient presented with headache and right upper extremity numbness and blurred vision. He denies any prior history of strokes or TIAs but his MRI scan shows evidence of bilateral parieto-occipital cortical old lesions with encephalomalacia and gliosis possibly old infarcts. He did gives a history of aseptic meningitis at age 1 but denies significant neurological sequelae from that. He denies any history of known carotid disease but his MRI scan and carotid ultrasound suggest chronic left ICA occlusion. He states he has not seen any physician for many years. OBJECTIVE Temp:  [97.6 F (36.4 C)-98.2 F (36.8 C)] 98.2 F (36.8 C) (06/12 1443) Pulse Rate:  [58-72] 58 (06/12 1443) Cardiac Rhythm: Normal sinus rhythm (06/12 0816) Resp:  [15-20] 20 (06/12 1443) BP: (95-124)/(61-80) 112/68 (06/12 1443) SpO2:  [97 %-100 %] 98 % (06/12 1443)  CBC:  Recent Labs Lab 04/11/17 1016  WBC 7.4  NEUTROABS 5.2  HGB 14.4  HCT 39.8  MCV 84.1  PLT 177    Basic Metabolic Panel:  Recent Labs Lab 04/11/17 1016  NA 137  K 3.3*  CL 101  CO2 27  GLUCOSE 71  BUN 10  CREATININE 1.18  CALCIUM 8.6*    Lipid Panel:    Component Value Date/Time   CHOL 191 04/12/2017 0552   TRIG 77 04/12/2017 0552   HDL 38 (L) 04/12/2017 0552   CHOLHDL 5.0 04/12/2017 0552   VLDL 15 04/12/2017 0552   LDLCALC 138 (H) 04/12/2017 0552   HgbA1c: No results found for: HGBA1C Urine Drug Screen: No results found for: LABOPIA, COCAINSCRNUR, LABBENZ, AMPHETMU,  THCU, LABBARB  Alcohol Level No results found for: [facility]  IMAGING  Dg Chest 2 View 04/11/2017 IMPRESSION: There is no acute cardiopulmonary abnormality.  Mr Brain Wo Contrast (neuro Protocol) 04/11/2017 IMPRESSION: Multiple small areas of acute infarct in both cerebral hemisphere most likely due to emboli Occlusion of left internal carotid artery. Extensive chronic ischemia bilaterally. Probable chronic watershed infarct in both cerebral hemispheres.  Mr Maxine Glenn Head/brain Wo Cm 04/11/2017 CLINICAL DATA:  Headache and right-sided numbness EXAM: MRA HEAD WITHOUT CONTRAST IMPRESSION: 1. Occlusion of the left internal carotid artery with flow related enhancement at the carotid terminus and normal appearance of the left anterior and middle cerebral arteries, secondary to collateral flow across the complete circle of Willis. 2. Otherwise normal intracranial MRA.  VAS US Carotid duplex bilateral 04/11/2017 Summary: - Right - No evience of ICA stenosis. Vertebral artery flow is antegrade. - Left - Abnormal Doppler signal in the bulb ( spiked and No color flow or Doppler signal noted distally suggests Left ICA occlusion. No evidence of plaque or thrombus noted. Vertebral artery flow is antegrade.  2D Echo 04/11/2017 pending   PHYSICAL EXAM Pleasant middle aged caucasian male not in distress. . Afebrile. Head is nontraumatic. Neck is supple without bruit.    Cardiac exam no murmur or gallop. Lungs are clear to auscultation. Distal pulses are well felt. Neurological Exam ;  Awake  Alert oriented x 3. Normal speech and language.eye movements full without nystagmus.fundi were not visualized. Vision acuity and fields appear  normal. Hearing is normal. Palatal movements are normal. Face symmetric. Tongue midline. Normal strength, tone, reflexes and coordination. Normal sensation. Gait deferred.  ASSESSMENT/PLAN Daniel Stuart is a 51 y.o. male with no significant past medical history presenting with  headache, RUE numbness, blurred vision.     Stroke:  BilLarina Earthlyateral anterior circulation waterhshed infarcts in setting of old posterior watershed laminar necrosis  Resultant  no focal deficits  MRI head multiple acute bilateral anterior circulation infarcts in setting of old posterior watershed infarcts.  MRA head  Left ICA occlusion with collateral flow across circle of Willis.  Carotid Doppler right ICA 1-39, % stenosis, antegrade, left ICA occlusion, VAs antegrade  2D Echo pending  TEE to look for embolic source.[facility]..  If positive for PFO (patent foramen ovale), check bilateral lower extremity venous dopplers to rule out DVT as possible source of stroke.   If TEE negative, a  [facility] electrophys[facility] will consult and consider placement of an implantable loop recorder to evaluate for atrial fibrillation as etiology of stroke. This has been explained to patient/family by Dr. Pearlean BrownieSethi and they are agreeable.   LDL 138  HgbA1c pending  SCDs for VTE prophylaxis  Diet Heart Room service appropriate? Yes; Fluid consistency: Thin  No antithrombotic prior to admission, now on aspirin 81 mg daily  Patient counseled to be compliant with hisantithrombotic medications  Ongoing aggressive stroke risk factor management  Therapy recommendations: Outpatient SLP;Other (comment) (intermittent supervision),   Disposition: pending  Hyperlipidemia  Home meds: none, started on atorvastatin 80 mg daily in hospital  LDL 138, goal < 70  Continue statin at discharge  Other Stroke Risk Factors  Advanced age  Cigarette smoker, advised to stop smoking  Other Active Problems  Hx of septic meningitis at age 51 per pt. No reported ICU stay or long hospitalization. Uncertain if this is cause of laminar necrosis seen on imaging.   Hospital day # 1  I have personally examined this patient, reviewed notes, independently  viewed imaging studies, participated in medical decision making and plan of care.ROS completed by me personally and pertinent positives fully documented  I have made any additions or clarifications directly to the above note. He presented with transient symptoms of headache and numbness but MRI shows tiny bilateral embolic infarcts of undetermined etiology. MRI also shows bilateral parietal encephalomalacia and laminar necrosis likely from remote age infarcts which are likely clinically silent. He also has chronic left ICA occlusion. Etiology of present strokes likely cardioembolic or cryptogenic and needs further evaluation. I have reviewed telemetry monitoring strips for last 24 hours and did not see any evidence of atrial fibrillation. Plan check TEE and loop recorder if transthoracic echo is unyielding. Patient may also be considered for possible participation in the young embolic stroke of unknown source registry if interested. Continue aspirin for stroke prevention. and and statin for elevated LDL. Patient counseled to quit smoking cigarettes and marijuana and at about healthy lifestyle changes. Greater than 50% time during this 35 minute visit was spent on counseling and coordination of care about stroke and TIA risk, discussion about carotid occlusion and need for further evaluation and answered questions.  Delia HeadyPramod Mckensi Redinger, MD Medical Director [facility] Pager: [phone] 04/12/2017 6:41 PM   To contact Stroke Continuity provider, please refer to [email or web address]. After hours, contact General Neurology

## 2017-04-13 ENCOUNTER — Encounter (HOSPITAL_COMMUNITY): Payer: Self-pay | Admitting: *Deleted

## 2017-04-13 ENCOUNTER — Encounter (HOSPITAL_COMMUNITY)
Admission: EM | Disposition: A | Discharge status: Home / Self Care | Payer: Self-pay | Source: Home / Self Care | Attending: Family Medicine

## 2017-04-13 ENCOUNTER — Inpatient Hospital Stay (HOSPITAL_COMMUNITY): Payer: Self-pay

## 2017-04-13 DIAGNOSIS — I639 Cerebral infarction, unspecified: Secondary | ICD-10-CM

## 2017-04-13 DIAGNOSIS — I351 Nonrheumatic aortic (valve) insufficiency: Secondary | ICD-10-CM

## 2017-04-13 DIAGNOSIS — I779 Disorder of arteries and arterioles, unspecified: Secondary | ICD-10-CM

## 2017-04-13 DIAGNOSIS — Z720 Tobacco use: Secondary | ICD-10-CM

## 2017-04-13 DIAGNOSIS — I6340 Cerebral infarction due to embolism of unspecified cerebral artery: Principal | ICD-10-CM

## 2017-04-13 DIAGNOSIS — F17200 Nicotine dependence, unspecified, uncomplicated: Secondary | ICD-10-CM

## 2017-04-13 DIAGNOSIS — E785 Hyperlipidemia, unspecified: Secondary | ICD-10-CM

## 2017-04-13 HISTORY — PX: TEE WITHOUT CARDIOVERSION: SHX5443

## 2017-04-13 HISTORY — PX: LOOP RECORDER INSERTION: EP1214

## 2017-04-13 LAB — COMPREHENSIVE METABOLIC PANEL
ALT: 10 U/L — AB (ref 17–63)
AST: 13 U/L — AB (ref 15–41)
Albumin: 3.5 g/dL (ref 3.5–5.0)
Alkaline Phosphatase: 79 U/L (ref 38–126)
Anion gap: 10 (ref 5–15)
BUN: 11 mg/dL (ref 6–20)
CHLORIDE: 101 mmol/L (ref 101–111)
CO2: 24 mmol/L (ref 22–32)
CREATININE: 1.15 mg/dL (ref 0.61–1.24)
Calcium: 8.6 mg/dL — ABNORMAL LOW (ref 8.9–10.3)
Glucose, Bld: 87 mg/dL (ref 65–99)
POTASSIUM: 3.9 mmol/L (ref 3.5–5.1)
SODIUM: 135 mmol/L (ref 135–145)
Total Bilirubin: 0.5 mg/dL (ref 0.3–1.2)
Total Protein: 7.1 g/dL (ref 6.5–8.1)

## 2017-04-13 LAB — HEMOGLOBIN A1C
Hgb A1c MFr Bld: 5.4 % (ref 4.8–5.6)
Mean Plasma Glucose: 108 mg/dL

## 2017-04-13 SURGERY — ECHOCARDIOGRAM, TRANSESOPHAGEAL
Anesthesia: Moderate Sedation

## 2017-04-13 SURGERY — LOOP RECORDER INSERTION

## 2017-04-13 MED ORDER — DIPHENHYDRAMINE HCL 50 MG/ML IJ SOLN
INTRAMUSCULAR | Status: AC
  Filled 2017-04-13: qty 1

## 2017-04-13 MED ORDER — FENTANYL CITRATE (PF) 100 MCG/2ML IJ SOLN
INTRAMUSCULAR | Status: AC
  Filled 2017-04-13: qty 2

## 2017-04-13 MED ORDER — STROKE: EARLY STAGES OF RECOVERY BOOK: 1.0000 | Freq: Once | Status: AC

## 2017-04-13 MED ORDER — ATORVASTATIN CALCIUM 80 MG PO TABS: 80.0000 mg | ORAL_TABLET | Freq: Every day | ORAL | 1 refills | Status: DC

## 2017-04-13 MED ORDER — LIDOCAINE-EPINEPHRINE 1 %-1:100000 IJ SOLN
INTRAMUSCULAR | Status: AC
  Filled 2017-04-13: qty 1

## 2017-04-13 MED ORDER — MIDAZOLAM HCL 5 MG/ML IJ SOLN
INTRAMUSCULAR | Status: AC
  Filled 2017-04-13: qty 2

## 2017-04-13 MED ORDER — BUTAMBEN-TETRACAINE-BENZOCAINE 2-2-14 % EX AERO
INHALATION_SPRAY | CUTANEOUS | Status: DC | PRN
  Administered 2017-04-13: 2 via TOPICAL

## 2017-04-13 MED ORDER — NICOTINE 21 MG/24HR TD PT24: 21.0000 mg | MEDICATED_PATCH | Freq: Every day | TRANSDERMAL | 1 refills | Status: DC

## 2017-04-13 MED ORDER — FENTANYL CITRATE (PF) 100 MCG/2ML IJ SOLN
INTRAMUSCULAR | Status: DC | PRN
  Administered 2017-04-13 (×2): 25 ug via INTRAVENOUS

## 2017-04-13 MED ORDER — LIDOCAINE-EPINEPHRINE 1 %-1:100000 IJ SOLN
INTRAMUSCULAR | Status: DC | PRN
  Administered 2017-04-13: 10 mL

## 2017-04-13 MED ORDER — MIDAZOLAM HCL 10 MG/2ML IJ SOLN
INTRAMUSCULAR | Status: DC | PRN
  Administered 2017-04-13: 1 mg via INTRAVENOUS
  Administered 2017-04-13: 2 mg via INTRAVENOUS

## 2017-04-13 MED ORDER — ASPIRIN 81 MG PO TBEC: 81.0000 mg | DELAYED_RELEASE_TABLET | Freq: Every day | ORAL | 3 refills | Status: DC

## 2017-04-13 SURGICAL SUPPLY — 2 items
LOOP REVEAL LINQSYS (Prosthesis & Implant Heart) ×2 IMPLANT
PACK LOOP INSERTION (CUSTOM PROCEDURE TRAY) ×2 IMPLANT

## 2017-04-13 NOTE — H&P (View-Only) (Signed)
ELECTROPHYSIOLOGY CONSULT NOTE  Patient ID: Daniel Stuart MRN: [id number], DOB/AGE: 1966-04-09   Admit date: 04/11/2017 Date of Consult: 04/13/2017  Primary Physician: Dolan AmenBailey, Sarah M, [name] Primary Cardiologist: None Reason for Consultation: Cryptogenic stroke ; recommendations regarding Implantable Loop Recorder, requested by Dr. Pearlean BrownieSethi  History of Present Illness Daniel Stuart was admitted on 04/11/2017 with acute CVA with symptoms of RUE numbness and visual changes.  Imaging demonstrated Bilateral anterior circulation waterhshed infarcts in setting of old posterior watershed laminar necrosis .   He has no know PMHx.   he has undergone workup for stroke includingcarotid angio with chronic L carotid occlusion, echo has been ordered and is pending.  The patient has been monitored on telemetry which has demonstrated sinus rhythm with no arrhythmias.  Inpatient stroke work-up is to be completed with a TEE.   Echocardiogram  pending  Lab work is reviewed.  Prior to admission, the patient denies chest pain, shortness of breath, dizziness, palpitations, or syncope.  They are recovering from their stroke with plans to home at discharge, d/w neuro, ready for discharge from their standpoint today.  EP has been asked to evaluate for placement of an implantable loop recorder to monitor for atrial fibrillation.     History reviewed. No pertinent past medical history.   Surgical History:  Past Surgical History:  Procedure Laterality Date  . FOOT SURGERY       Prescriptions Prior to Admission  Medication Sig Dispense Refill Last Dose  . ibuprofen (ADVIL,MOTRIN) 200 MG tablet Take 400 mg by mouth every 6 (six) hours as needed.   04/10/2017 at Unknown time    Inpatient Medications:  .  stroke: mapping our early stages of recovery book   Does not apply Once  . aspirin EC  81 mg Oral Daily  . atorvastatin  80 mg Oral q1800    Allergies: No Known Allergies  Social History    Social History  . Marital status: Legally Separated    Spouse name: N/A  . Number of children: N/A  . Years of education: N/A   Occupational History  . Not on file.   Social History Main Topics  . Smoking status: Current Every Day Smoker    Packs/day: 1.00    Types: Cigarettes  . Smokeless tobacco: Never Used  . Alcohol use No  . Drug use: No  . Sexual activity: Not on file   Other Topics Concern  . Not on file   Social History Narrative  . No narrative on file     Family History  Problem Relation Age of Onset  . Diabetes Mother   . Diabetes Father   . Heart failure Father       Review of Systems: All other systems reviewed and are otherwise negative except as noted above.  Physical Exam: Vitals:   04/13/17 0115 04/13/17 0505 04/13/17 0900 04/13/17 0925  BP: (!) 96/57 98/63 100/66 104/65  Pulse: (!) 59 (!) 58 62 66  Resp: 20 20 18 18   Temp: 97.8 F (36.6 C) 97.8 F (36.6 C) 98.2 F (36.8 C) 98 F (36.7 C)  TempSrc: Oral Oral Oral Oral  SpO2: 100% 100% 100% 96%  Weight:      Height:        GEN- The patient is well appearing, alert and oriented x 3 today.   Head- normocephalic, atraumatic Eyes-  Sclera clear, conjunctiva pink Ears- hearing intact Oropharynx- clear Neck- supple Lungs- CTA b/l, normal work of breathing  Heart- RRR, no murmurs, rubs or gallops  GI- soft, NT, ND Extremities- no clubbing, cyanosis, or edema MS- no significant deformity or atrophy Skin- no rash or lesion Psych- euthymic mood, full affect   Labs:   Lab Results  Component Value Date   WBC 7.4 04/11/2017   HGB 14.4 04/11/2017   HCT 39.8 04/11/2017   MCV 84.1 04/11/2017   PLT 177 04/11/2017    Recent Labs Lab 04/13/17 0429  NA 135  K 3.9  CL 101  CO2 24  BUN 11  CREATININE 1.15  CALCIUM 8.6*  PROT 7.1  BILITOT 0.5  ALKPHOS 79  ALT 10*  AST 13*  GLUCOSE 87   No results found for: CKTOTAL, CKMB, CKMBINDEX, TROPONINI Lab Results  Component Value  Date   CHOL 191 04/12/2017   Lab Results  Component Value Date   HDL 38 (L) 04/12/2017   Lab Results  Component Value Date   LDLCALC 138 (H) 04/12/2017   Lab Results  Component Value Date   TRIG 77 04/12/2017   Lab Results  Component Value Date   CHOLHDL 5.0 04/12/2017   No results found for: LDLDIRECT  No results found for: DDIMER   Radiology/Studies:  Dg Chest 2 View Result Date: 04/11/2017 CLINICAL DATA:  Code stroke EXAM: CHEST  2 VIEW COMPARISON:  None in PACs FINDINGS: The lungs are well-expanded. There is no focal infiltrate. There is no pleural effusion. The heart and pulmonary vascularity are normal. The mediastinum is normal in width. The trachea is midline. The bony thorax is unremarkable. IMPRESSION: There is no acute cardiopulmonary abnormality. Electronically Signed   By: David  Swaziland M.D.   On: 04/11/2017 16:26   Mr Brain Wo Contrast (neuro Protocol) Result Date: 04/11/2017 CLINICAL DATA:  Headache.  Right-sided numbness EXAM: MRI HEAD WITHOUT CONTRAST TECHNIQUE: Multiplanar, multiecho pulse sequences of the brain and surrounding structures were obtained without intravenous contrast. COMPARISON:  None. FINDINGS: Brain: Multiple small areas of restricted diffusion in both cerebral hemispheres compatible with acute infarcts, likely embolic. These areas all measure under 1 cm. No infarct in the brainstem or cerebellum. Chronic infarcts in the frontal and parietal cortex and white matter bilaterally, with the appearance of watershed chronic infarction. Chronic infarcts in the occipital lobes bilaterally and bilateral cerebral white matter chronic infarcts. Posterior fossa normal. Negative for hemorrhage or mass. Ventricle size normal. No shift of the midline structures Vascular: Occlusion of the left internal carotid artery of unknown chronicity. Right internal carotid artery and basilar artery have normal flow voids. Skull and upper cervical spine: Negative Sinuses/Orbits:  Negative Other: None IMPRESSION: Multiple small areas of acute infarct in both cerebral hemisphere most likely due to emboli Occlusion of left internal carotid artery. Extensive chronic ischemia bilaterally. Probable chronic watershed infarct in both cerebral hemispheres. Electronically Signed   By: Marlan Palau M.D.   On: 04/11/2017 12:25   Mr Maxine Glenn Head/brain NW Cm Result Date: 04/11/2017 CLINICAL DATA:  Headache and right-sided numbness EXAM: MRA HEAD WITHOUT CONTRAST TECHNIQUE: Angiographic images of the Circle of Willis were obtained using MRA technique without intravenous contrast. COMPARISON:  Brain MRI 04/11/2017 FINDINGS: Intracranial internal carotid arteries: The left internal carotid artery is occluded. There is reconstitution of left ICA flow related enhancement at the carotid terminus. Anterior cerebral arteries: Normal. Middle cerebral arteries: Normal. Posterior communicating arteries: Present bilaterally. Posterior cerebral arteries: Normal. Basilar artery: Normal. Vertebral arteries: Left dominant. Normal. Superior cerebellar arteries: Normal. Anterior inferior cerebellar arteries: Normal. Posterior inferior cerebellar  arteries: Normal. IMPRESSION: 1. Occlusion of the left internal carotid artery with flow related enhancement at the carotid terminus and normal appearance of the left anterior and middle cerebral arteries, secondary to collateral flow across the complete circle of Willis. 2. Otherwise normal intracranial MRA. Electronically Signed   By: Deatra Robinson M.D.   On: 04/11/2017 20:10    12-lead ECG SR All prior EKG's in EPIC reviewed with no documented atrial fibrillation  Telemetry SR  Assessment and Plan:  1. Cryptogenic stroke The patient presents with cryptogenic stroke.  The patient has a TEE planned for this AM.  Dr. Ladona Ridgel spoke at length with the patient about monitoring for afib with either a 30 day event monitor or an implantable loop recorder.  Risks, benefits, and  alteratives to implantable loop recorder were discussed with the patient today.   At this time, the patient is very clear in their decision to proceed with implantable loop recorder.   Wound care was reviewed with the patient (keep incision clean and dry for 3 days).  Wound check will be scheduled for the patient  Please call with questions.   Renee Norberto Sorenson, PA-C 04/13/2017   EP Attending  Patient seen and examined. Agree with above. The patient has had a cryptogenic stroke. I have discussed the treatment options with the patient and the indications/risks/benefits/goals/expectations of ILR insertion have been reviewed and he wishes to proceed.  Leonia Reeves.D.

## 2017-04-13 NOTE — Discharge Summary (Signed)
Physician Discharge Summary  Daniel Earthlyimothy E Stuart ZOX:[id number] DOB: [birth year] DOA: 04/11/2017  PCP: Dolan AmenBailey, Sarah M, [name]  Admit date: 04/11/2017 Discharge date: 04/13/2017  Time spent: 35 minutes  Recommendations for Outpatient Follow-up:  1. Repeat CMET to follow electrolytes, renal function and liver function test 2. Reassess lipid panel in 6-8 weeks; adjust hypolipidemic agents as needed 3. Assist as much as possible with smoking sensation.   Discharge Diagnoses:  Active Problems:   CVA (cerebral vascular accident) (HCC)   Tobacco abuse   Dyslipidemia   PVD (peripheral vascular disease) (HCC)   Carotid arterial disease (HCC)   Hyperlipidemia   Discharge Condition: Stable and improved. Patient has been discharged home with arrangements to establish primary care physician with [facility], and also outpatient rehabilitation services for occupational therapy and a speech therapy. He will follow-up with cardiology service for further evaluation and treatment of his loop recorder insertion.  Diet recommendation: Heart healthy diet  Filed Weights   04/11/17 0911  Weight: 59 kg (130 lb)    History of present illness:  As per H&P written by Dr. Elisabeth Pigeonevine on 04/11/17 51 y.o. male with no significant medical history, not on any medications. Patient presented to ED for evaluation of worsening headache, intermittent visual changes and numbness in his right arm. Patient reported his symptoms for several weeks to few months and thought that symptoms would resolve on their own however symptoms never subsided. Headache is located on top of the head and it is about 6 out of 10 in intensity, nonradiating and sometimes he gets blurry vision. Patient is taking ibuprofen as needed, this has not provided symptomatic relief. No associated auras. In regards to arm numbness patient reports this is still present and he also has intermittent numbness in his left leg which cause him to have  little trouble with walking. No lightheadedness or loss of consciousness. No dizziness. No falls. No chest pain or shortness of breath or palpitations. No abdominal pain, nausea or vomiting. No urinary complaints. ED Course: In ED, patient was hemodynamically stable. Blood work was relatively unremarkable except for potassium of 3.3 which was supplemented in ED. Chest x-ray showed no acute cardiopulmonary findings. MRI brain showed multiple small areas of acute infarct in cerebral hemispheres most likely due to emboli and occlusion of the left internal carotid artery. Per neurology, [facility].  Hospital Course:  1-acute CVA involving both cerebral hemisphere likely secondary to emboli from of occlusion of the left internal carotid artery/cryptogenic stroke: -Patient workup has demonstrated left ICA occlusion -Normal echo -A1c 5.4 -Elevated LDL (138) -Patient is status post loop recorder insertion -Discharged on aspirin for secondary prevention and has also been started on a statins -Advised to quit smoking and to follow a heart healthy diet. -Patient with a very minimal deficit at discharge; outpatient occupational therapy and speech therapy has been arranged.  2-tobacco abuse -Smoking cessation counseling has been provided -Nicotine patch has been prescribed at discharge  3-hyperlipidemia -Heart healthy diet has been recommended -Patient started on Lipitor 80 mg by mouth daily as recommended by neurology service -goal is LDL < 70  4-Hypokalemia -Electrolytes repleted and within normal limits at discharge  Procedures:  See below for x-ray reports  Carotid Dopplers: Left ICA occlusion. Asian with no evidence of right ICA stenosis and with bilateral antegrade vertebral artery flow.  Transesophageal echocardiogram:6/13 - Left ventricle: Systolic function was normal. The estimated   ejection fraction was in  the range of 60% to 65%. Wall  motion was   normal; there were no regional wall motion abnormalities. - Aortic valve: There was mild regurgitation. - Left atrium: No evidence of thrombus in the atrial cavity or   appendage. No evidence of thrombus in the atrial cavity or   appendage. No evidence of thrombus in the appendage. - Right atrium: No evidence of thrombus in the atrial cavity or   appendage. - Atrial septum: No defect or patent foramen ovale was identified.   Echo contrast study showed no right-to-left atrial level shunt,   at baseline or with provocation.   Implantable loop recorder:6/13  Consultations:  Neurology  Cardiology  Discharge Exam: Vitals:   04/13/17 1545 04/13/17 1555  BP: 108/63 115/60  Pulse: 66 68  Resp: 14 13  Temp:      General: Afebrile, denies chest pain, no shortness of breath. Patient is pretty competitive with examination, able to follow commands and in no acute distress. Cardiovascular: S1 and S2, no JVD, no murmurs, no gallops, no rubs. Respiratory: Good air movement bilaterally, no wheezing, no crackles Abdomen: Soft, nontender, nondistended, positive bowel sounds Extremities: No cyanosis, no clubbing   Discharge Instructions   Discharge Instructions    Ambulatory referral to Occupational Therapy    Complete by:  As directed    Ambulatory referral to Physical Therapy    Complete by:  As directed    Diet - low sodium heart healthy    Complete by:  As directed    Discharge instructions    Complete by:  As directed    Follow as instructed with cardiology service Keep your loop recorder wound clean and dry Follow a heart healthy diet Take medications as prescribed Keep yourself well-hydrated Arrange follow-up visit to establish care with [facility] as instructed. Stop smoking     Current Discharge Medication List    START taking these medications   Details   stroke: mapping our early stages of recovery book MISC 1 each by Does not apply  route once.    aspirin EC 81 MG EC tablet Take 1 tablet (81 mg total) by mouth daily. Qty: 30 tablet, Refills: 3    atorvastatin (LIPITOR) 80 MG tablet Take 1 tablet (80 mg total) by mouth daily at 6 PM. Qty: 30 tablet, Refills: 1    nicotine (NICODERM CQ) 21 mg/24hr patch Place 1 patch (21 mg total) onto the skin daily. Qty: 28 patch, Refills: 1      STOP taking these medications     ibuprofen (ADVIL,MOTRIN) 200 MG tablet        No Known Allergies Follow-up Information    [facility] Follow up on 04/18/2017.   Why:  Your appointment time is 9:30. Please arrive 15 min early. Please bring picture ID and current medications.  Contact information: [address] [place] [address]       [facility] Follow up.   Specialty:  Rehabilitation Why:  they will contact you for the first visit. Contact information: [address] [id number] mc [place] [address] [phone]       [facility] [address] [facility] Follow up on 04/21/2017.   Specialty:  Cardiology Why:  12:00PM, (noon), wound check Contact information: [address] [place] [address] [phone]          The results of significant diagnostics from this hospitalization (including imaging, microbiology, ancillary  and laboratory) are listed below for reference.    Significant Diagnostic Studies: Dg Chest 2 View  Result Date: 04/11/2017 CLINICAL DATA:  Code stroke EXAM: CHEST  2 VIEW COMPARISON:  None in PACs FINDINGS: The lungs are well-expanded. There is no focal infiltrate. There is no pleural effusion. The heart and pulmonary vascularity are normal. The mediastinum is normal in width. The trachea is midline. The bony thorax is unremarkable. IMPRESSION: There is no acute cardiopulmonary abnormality. Electronically Signed   By: David  Swaziland M.D.   On: 04/11/2017 16:26   Mr  Brain Wo Contrast (neuro Protocol)  Result Date: 04/11/2017 CLINICAL DATA:  Headache.  Right-sided numbness EXAM: MRI HEAD WITHOUT CONTRAST TECHNIQUE: Multiplanar, multiecho pulse sequences of the brain and surrounding structures were obtained without intravenous contrast. COMPARISON:  None. FINDINGS: Brain: Multiple small areas of restricted diffusion in both cerebral hemispheres compatible with acute infarcts, likely embolic. These areas all measure under 1 cm. No infarct in the brainstem or cerebellum. Chronic infarcts in the frontal and parietal cortex and white matter bilaterally, with the appearance of watershed chronic infarction. Chronic infarcts in the occipital lobes bilaterally and bilateral cerebral white matter chronic infarcts. Posterior fossa normal. Negative for hemorrhage or mass. Ventricle size normal. No shift of the midline structures Vascular: Occlusion of the left internal carotid artery of unknown chronicity. Right internal carotid artery and basilar artery have normal flow voids. Skull and upper cervical spine: Negative Sinuses/Orbits: Negative Other: None IMPRESSION: Multiple small areas of acute infarct in both cerebral hemisphere most likely due to emboli Occlusion of left internal carotid artery. Extensive chronic ischemia bilaterally. Probable chronic watershed infarct in both cerebral hemispheres. Electronically Signed   By: Marlan Palau M.D.   On: 04/11/2017 12:25   Mr Maxine Glenn Head/brain ZO Cm  Result Date: 04/11/2017 CLINICAL DATA:  Headache and right-sided numbness EXAM: MRA HEAD WITHOUT CONTRAST TECHNIQUE: Angiographic images of the Circle of Willis were obtained using MRA technique without intravenous contrast. COMPARISON:  Brain MRI 04/11/2017 FINDINGS: Intracranial internal carotid arteries: The left internal carotid artery is occluded. There is reconstitution of left ICA flow related enhancement at the carotid terminus. Anterior cerebral arteries: Normal. Middle cerebral  arteries: Normal. Posterior communicating arteries: Present bilaterally. Posterior cerebral arteries: Normal. Basilar artery: Normal. Vertebral arteries: Left dominant. Normal. Superior cerebellar arteries: Normal. Anterior inferior cerebellar arteries: Normal. Posterior inferior cerebellar arteries: Normal. IMPRESSION: 1. Occlusion of the left internal carotid artery with flow related enhancement at the carotid terminus and normal appearance of the left anterior and middle cerebral arteries, secondary to collateral flow across the complete circle of Willis. 2. Otherwise normal intracranial MRA. Electronically Signed   By: Deatra Robinson M.D.   On: 04/11/2017 20:10    Microbiology: No results found for this or any previous visit (from the past 240 hour(s)).   Labs: Basic Metabolic Panel:  Recent Labs Lab 04/11/17 1016 04/13/17 0429  NA 137 135  K 3.3* 3.9  CL 101 101  CO2 27 24  GLUCOSE 71 87  BUN 10 11  CREATININE 1.18 1.15  CALCIUM 8.6* 8.6*   Liver Function Tests:  Recent Labs Lab 04/13/17 0429  AST 13*  ALT 10*  ALKPHOS 79  BILITOT 0.5  PROT 7.1  ALBUMIN 3.5   CBC:  Recent Labs Lab 04/11/17 1016  WBC 7.4  NEUTROABS 5.2  HGB 14.4  HCT 39.8  MCV 84.1  PLT 177    Signed:  Vassie Loll MD.  [facility] 04/13/2017, 6:23 PM

## 2017-04-13 NOTE — Progress Notes (Signed)
  Echocardiogram Echocardiogram Transesophageal has been performed.  Purvi Ruehl L Androw 04/13/2017, 3:04 PM

## 2017-04-13 NOTE — Progress Notes (Signed)
    [facility] has been requested to perform a transesophageal echocardiogram on Daniel Stuart for cerebrovascular accident.  After careful review of history and examination, the risks and benefits of transesophageal echocardiogram have been explained including risks of esophageal damage, perforation (1:10,000 risk), bleeding, pharyngeal hematoma as well as other potential complications associated with conscious sedation including aspiration, arrhythmia, respiratory failure and death. Alternatives to treatment were discussed, questions were answered. Patient is willing to proceed.   Labs show stable Hgb of 14.4, platelets 177. BP stable with no requirement for pressors.   Scheduled for Dr. Anne FuSkains at 1500.   Ellsworth LennoxBrittany M Carneshia Raker, PA-C  04/13/2017 8:29 AM

## 2017-04-13 NOTE — Interval H&P Note (Signed)
History and Physical Interval Note:  04/13/2017 2:36 PM  Daniel Stuart  has presented today for surgery, with the diagnosis of Stroke  The various methods of treatment have been discussed with the patient and family. After consideration of risks, benefits and other options for treatment, the patient has consented to  Procedure(s): TRANSESOPHAGEAL ECHOCARDIOGRAM (TEE) (N/A) as a surgical intervention .  The patient's history has been reviewed, patient examined, no change in status, stable for surgery.  I have reviewed the patient's chart and labs.  Questions were answered to the patient's satisfaction.     Coca ColaMark Skains

## 2017-04-13 NOTE — CV Procedure (Signed)
   TEE  Indication: Stroke  Time out performed  During this procedure the patient is administered a total of Versed 3 mg and Fentanyl 50 mcg to achieve and maintain moderate conscious sedation.  The patient's heart rate, blood pressure, and oxygen saturation are monitored continuously during the procedure. The period of conscious sedation is 20 minutes, of which I was present face-to-face 100% of this time.  Findings:  Normal EF Mild AR Trace MR Negative bubble study No embolic source identified  Donato SchultzMark Skains, MD

## 2017-04-13 NOTE — H&P (View-Only) (Signed)
STROKE TEAM PROGRESS NOTE   HISTORY OF PRESENT ILLNESS (per record) Daniel Stuart is an 51 y.o. male with no significant past medical history who presented with headache, RUE numbness, blurred vision.  The patient endorses recurrent headache RLE weakness, and non-vertiginous dizziness for the past two months.  MRI demonstrated diffuse microemboli with possible chronic underlying watershed infarcts and a chronic left ICA occlusion.   SUBJECTIVE (INTERVAL HISTORY) Patient presented with headache and right upper extremity numbness and blurred vision. He denies any prior history of strokes or TIAs but his MRI scan shows evidence of bilateral parieto-occipital cortical old lesions with encephalomalacia and gliosis possibly old infarcts. He did gives a history of aseptic meningitis at age 1 but denies significant neurological sequelae from that. He denies any history of known carotid disease but his MRI scan and carotid ultrasound suggest chronic left ICA occlusion. He states he has not seen any physician for many years. OBJECTIVE Temp:  [97.6 F (36.4 C)-98.2 F (36.8 C)] 98.2 F (36.8 C) (06/12 1443) Pulse Rate:  [58-72] 58 (06/12 1443) Cardiac Rhythm: Normal sinus rhythm (06/12 0816) Resp:  [15-20] 20 (06/12 1443) BP: (95-124)/(61-80) 112/68 (06/12 1443) SpO2:  [97 %-100 %] 98 % (06/12 1443)  CBC:  Recent Labs Lab 04/11/17 1016  WBC 7.4  NEUTROABS 5.2  HGB 14.4  HCT 39.8  MCV 84.1  PLT 177    Basic Metabolic Panel:  Recent Labs Lab 04/11/17 1016  NA 137  K 3.3*  CL 101  CO2 27  GLUCOSE 71  BUN 10  CREATININE 1.18  CALCIUM 8.6*    Lipid Panel:    Component Value Date/Time   CHOL 191 04/12/2017 0552   TRIG 77 04/12/2017 0552   HDL 38 (L) 04/12/2017 0552   CHOLHDL 5.0 04/12/2017 0552   VLDL 15 04/12/2017 0552   LDLCALC 138 (H) 04/12/2017 0552   HgbA1c: No results found for: HGBA1C Urine Drug Screen: No results found for: LABOPIA, COCAINSCRNUR, LABBENZ, AMPHETMU,  THCU, LABBARB  Alcohol Level No results found for: [facility]  IMAGING  Dg Chest 2 View 04/11/2017 IMPRESSION: There is no acute cardiopulmonary abnormality.  Mr Brain Wo Contrast (neuro Protocol) 04/11/2017 IMPRESSION: Multiple small areas of acute infarct in both cerebral hemisphere most likely due to emboli Occlusion of left internal carotid artery. Extensive chronic ischemia bilaterally. Probable chronic watershed infarct in both cerebral hemispheres.  Mr Maxine Glenn Head/brain Wo Cm 04/11/2017 CLINICAL DATA:  Headache and right-sided numbness EXAM: MRA HEAD WITHOUT CONTRAST IMPRESSION: 1. Occlusion of the left internal carotid artery with flow related enhancement at the carotid terminus and normal appearance of the left anterior and middle cerebral arteries, secondary to collateral flow across the complete circle of Willis. 2. Otherwise normal intracranial MRA.  VAS US Carotid duplex bilateral 04/11/2017 Summary: - Right - No evience of ICA stenosis. Vertebral artery flow is antegrade. - Left - Abnormal Doppler signal in the bulb ( spiked and No color flow or Doppler signal noted distally suggests Left ICA occlusion. No evidence of plaque or thrombus noted. Vertebral artery flow is antegrade.  2D Echo 04/11/2017 pending   PHYSICAL EXAM Pleasant middle aged caucasian male not in distress. . Afebrile. Head is nontraumatic. Neck is supple without bruit.    Cardiac exam no murmur or gallop. Lungs are clear to auscultation. Distal pulses are well felt. Neurological Exam ;  Awake  Alert oriented x 3. Normal speech and language.eye movements full without nystagmus.fundi were not visualized. Vision acuity and fields appear  normal. Hearing is normal. Palatal movements are normal. Face symmetric. Tongue midline. Normal strength, tone, reflexes and coordination. Normal sensation. Gait deferred.  ASSESSMENT/PLAN [name] is a 51 y.o. male with no significant past medical history presenting with  headache, RUE numbness, blurred vision.     Stroke:  BilLarina Earthlyateral anterior circulation waterhshed infarcts in setting of old posterior watershed laminar necrosis  Resultant  no focal deficits  MRI head multiple acute bilateral anterior circulation infarcts in setting of old posterior watershed infarcts.  MRA head  Left ICA occlusion with collateral flow across circle of Willis.  Carotid Doppler right ICA 1-39, % stenosis, antegrade, left ICA occlusion, VAs antegrade  2D Echo pending  TEE to look for embolic source.[facility]..  If positive for PFO (patent foramen ovale), check bilateral lower extremity venous dopplers to rule out DVT as possible source of stroke.   If TEE negative, a  [facility] will consult and consider placement of an implantable loop recorder to evaluate for atrial fibrillation as etiology of stroke. This has been explained to patient/family by Dr. Pearlean BrownieSethi and they are agreeable.   LDL 138  HgbA1c pending  SCDs for VTE prophylaxis  Diet Heart Room service appropriate? Yes; Fluid consistency: Thin  No antithrombotic prior to admission, now on aspirin 81 mg daily  Patient counseled to be compliant with hisantithrombotic medications  Ongoing aggressive stroke risk factor management  Therapy recommendations: Outpatient SLP;Other (comment) (intermittent supervision),   Disposition: pending  Hyperlipidemia  Home meds: none, started on atorvastatin 80 mg daily in hospital  LDL 138, goal < 70  Continue statin at discharge  Other Stroke Risk Factors  Advanced age  Cigarette smoker, advised to stop smoking  Other Active Problems  Hx of septic meningitis at age 51 per pt. No reported ICU stay or long hospitalization. Uncertain if this is cause of laminar necrosis seen on imaging.   Hospital day # 1  I have personally examined this patient, reviewed notes, independently  viewed imaging studies, participated in medical decision making and plan of care.ROS completed by me personally and pertinent positives fully documented  I have made any additions or clarifications directly to the above note. He presented with transient symptoms of headache and numbness but MRI shows tiny bilateral embolic infarcts of undetermined etiology. MRI also shows bilateral parietal encephalomalacia and laminar necrosis likely from remote age infarcts which are likely clinically silent. He also has chronic left ICA occlusion. Etiology of present strokes likely cardioembolic or cryptogenic and needs further evaluation. I have reviewed telemetry monitoring strips for last 24 hours and did not see any evidence of atrial fibrillation. Plan check TEE and loop recorder if transthoracic echo is unyielding. Patient may also be considered for possible participation in the young embolic stroke of unknown source registry if interested. Continue aspirin for stroke prevention. and and statin for elevated LDL. Patient counseled to quit smoking cigarettes and marijuana and at about healthy lifestyle changes. Greater than 50% time during this 35 minute visit was spent on counseling and coordination of care about stroke and TIA risk, discussion about carotid occlusion and need for further evaluation and answered questions.  Delia HeadyPramod Sethi, MD Medical Director [facility] Pager: [phone] 04/12/2017 6:41 PM   To contact Stroke Continuity provider, please refer to [email or web address]. After hours, contact General Neurology

## 2017-04-13 NOTE — Discharge Instructions (Signed)
Keep incision clean and dry for 3 days. Leave steri-strips (little pieces of tape) on until seen in the office for wound check appointment. Call the office [phone]) for redness, drainage, swelling, or fever.

## 2017-04-13 NOTE — Consult Note (Signed)
ELECTROPHYSIOLOGY CONSULT NOTE  Patient ID: Daniel Stuart MRN: [id number], DOB/AGE: 1966-04-09   Admit date: 04/11/2017 Date of Consult: 04/13/2017  Primary Physician: Dolan AmenBailey, Sarah M, [name] Primary Cardiologist: None Reason for Consultation: Cryptogenic stroke ; recommendations regarding Implantable Loop Recorder, requested by Dr. Pearlean BrownieSethi  History of Present Illness Daniel Stuart was admitted on 04/11/2017 with acute CVA with symptoms of RUE numbness and visual changes.  Imaging demonstrated Bilateral anterior circulation waterhshed infarcts in setting of old posterior watershed laminar necrosis .   He has no know PMHx.   he has undergone workup for stroke includingcarotid angio with chronic L carotid occlusion, echo has been ordered and is pending.  The patient has been monitored on telemetry which has demonstrated sinus rhythm with no arrhythmias.  Inpatient stroke work-up is to be completed with a TEE.   Echocardiogram  pending  Lab work is reviewed.  Prior to admission, the patient denies chest pain, shortness of breath, dizziness, palpitations, or syncope.  They are recovering from their stroke with plans to home at discharge, d/w neuro, ready for discharge from their standpoint today.  EP has been asked to evaluate for placement of an implantable loop recorder to monitor for atrial fibrillation.     History reviewed. No pertinent past medical history.   Surgical History:  Past Surgical History:  Procedure Laterality Date  . FOOT SURGERY       Prescriptions Prior to Admission  Medication Sig Dispense Refill Last Dose  . ibuprofen (ADVIL,MOTRIN) 200 MG tablet Take 400 mg by mouth every 6 (six) hours as needed.   04/10/2017 at Unknown time    Inpatient Medications:  .  stroke: mapping our early stages of recovery book   Does not apply Once  . aspirin EC  81 mg Oral Daily  . atorvastatin  80 mg Oral q1800    Allergies: No Known Allergies  Social History    Social History  . Marital status: Legally Separated    Spouse name: N/A  . Number of children: N/A  . Years of education: N/A   Occupational History  . Not on file.   Social History Main Topics  . Smoking status: Current Every Day Smoker    Packs/day: 1.00    Types: Cigarettes  . Smokeless tobacco: Never Used  . Alcohol use No  . Drug use: No  . Sexual activity: Not on file   Other Topics Concern  . Not on file   Social History Narrative  . No narrative on file     Family History  Problem Relation Age of Onset  . Diabetes Mother   . Diabetes Father   . Heart failure Father       Review of Systems: All other systems reviewed and are otherwise negative except as noted above.  Physical Exam: Vitals:   04/13/17 0115 04/13/17 0505 04/13/17 0900 04/13/17 0925  BP: (!) 96/57 98/63 100/66 104/65  Pulse: (!) 59 (!) 58 62 66  Resp: 20 20 18 18   Temp: 97.8 F (36.6 C) 97.8 F (36.6 C) 98.2 F (36.8 C) 98 F (36.7 C)  TempSrc: Oral Oral Oral Oral  SpO2: 100% 100% 100% 96%  Weight:      Height:        GEN- The patient is well appearing, alert and oriented x 3 today.   Head- normocephalic, atraumatic Eyes-  Sclera clear, conjunctiva pink Ears- hearing intact Oropharynx- clear Neck- supple Lungs- CTA b/l, normal work of breathing  Heart- RRR, no murmurs, rubs or gallops  GI- soft, NT, ND Extremities- no clubbing, cyanosis, or edema MS- no significant deformity or atrophy Skin- no rash or lesion Psych- euthymic mood, full affect   Labs:   Lab Results  Component Value Date   WBC 7.4 04/11/2017   HGB 14.4 04/11/2017   HCT 39.8 04/11/2017   MCV 84.1 04/11/2017   PLT 177 04/11/2017    Recent Labs Lab 04/13/17 0429  NA 135  K 3.9  CL 101  CO2 24  BUN 11  CREATININE 1.15  CALCIUM 8.6*  PROT 7.1  BILITOT 0.5  ALKPHOS 79  ALT 10*  AST 13*  GLUCOSE 87   No results found for: CKTOTAL, CKMB, CKMBINDEX, TROPONINI Lab Results  Component Value  Date   CHOL 191 04/12/2017   Lab Results  Component Value Date   HDL 38 (L) 04/12/2017   Lab Results  Component Value Date   LDLCALC 138 (H) 04/12/2017   Lab Results  Component Value Date   TRIG 77 04/12/2017   Lab Results  Component Value Date   CHOLHDL 5.0 04/12/2017   No results found for: LDLDIRECT  No results found for: DDIMER   Radiology/Studies:  Dg Chest 2 View Result Date: 04/11/2017 CLINICAL DATA:  Code stroke EXAM: CHEST  2 VIEW COMPARISON:  None in PACs FINDINGS: The lungs are well-expanded. There is no focal infiltrate. There is no pleural effusion. The heart and pulmonary vascularity are normal. The mediastinum is normal in width. The trachea is midline. The bony thorax is unremarkable. IMPRESSION: There is no acute cardiopulmonary abnormality. Electronically Signed   By: David  [place] M.D.   On: 04/11/2017 16:26   Mr Brain Wo Contrast (neuro Protocol) Result Date: 04/11/2017 CLINICAL DATA:  Headache.  Right-sided numbness EXAM: MRI HEAD WITHOUT CONTRAST TECHNIQUE: Multiplanar, multiecho pulse sequences of the brain and surrounding structures were obtained without intravenous contrast. COMPARISON:  None. FINDINGS: Brain: Multiple small areas of restricted diffusion in both cerebral hemispheres compatible with acute infarcts, likely embolic. These areas all measure under 1 cm. No infarct in the brainstem or cerebellum. Chronic infarcts in the frontal and parietal cortex and white matter bilaterally, with the appearance of watershed chronic infarction. Chronic infarcts in the occipital lobes bilaterally and bilateral cerebral white matter chronic infarcts. Posterior fossa normal. Negative for hemorrhage or mass. Ventricle size normal. No shift of the midline structures Vascular: Occlusion of the left internal carotid artery of unknown chronicity. Right internal carotid artery and basilar artery have normal flow voids. Skull and upper cervical spine: Negative Sinuses/Orbits:  Negative Other: None IMPRESSION: Multiple small areas of acute infarct in both cerebral hemisphere most likely due to emboli Occlusion of left internal carotid artery. Extensive chronic ischemia bilaterally. Probable chronic watershed infarct in both cerebral hemispheres. Electronically Signed   By: Marlan Palau M.D.   On: 04/11/2017 12:25   Mr Maxine Glenn Head/brain NW Cm Result Date: 04/11/2017 CLINICAL DATA:  Headache and right-sided numbness EXAM: MRA HEAD WITHOUT CONTRAST TECHNIQUE: Angiographic images of the Circle of Willis were obtained using MRA technique without intravenous contrast. COMPARISON:  Brain MRI 04/11/2017 FINDINGS: Intracranial internal carotid arteries: The left internal carotid artery is occluded. There is reconstitution of left ICA flow related enhancement at the carotid terminus. Anterior cerebral arteries: Normal. Middle cerebral arteries: Normal. Posterior communicating arteries: Present bilaterally. Posterior cerebral arteries: Normal. Basilar artery: Normal. Vertebral arteries: Left dominant. Normal. Superior cerebellar arteries: Normal. Anterior inferior cerebellar arteries: Normal. Posterior inferior cerebellar  arteries: Normal. IMPRESSION: 1. Occlusion of the left internal carotid artery with flow related enhancement at the carotid terminus and normal appearance of the left anterior and middle cerebral arteries, secondary to collateral flow across the complete circle of Willis. 2. Otherwise normal intracranial MRA. Electronically Signed   By: Deatra Robinson M.D.   On: 04/11/2017 20:10    12-lead ECG SR All prior EKG's in EPIC reviewed with no documented atrial fibrillation  Telemetry SR  Assessment and Plan:  1. Cryptogenic stroke The patient presents with cryptogenic stroke.  The patient has a TEE planned for this AM.  Dr. Ladona Ridgel spoke at length with the patient about monitoring for afib with either a 30 day event monitor or an implantable loop recorder.  Risks, benefits, and  alteratives to implantable loop recorder were discussed with the patient today.   At this time, the patient is very clear in their decision to proceed with implantable loop recorder.   Wound care was reviewed with the patient (keep incision clean and dry for 3 days).  Wound check will be scheduled for the patient  Please call with questions.   Renee Norberto Sorenson, PA-C 04/13/2017   EP Attending  Patient seen and examined. Agree with above. The patient has had a cryptogenic stroke. I have discussed the treatment options with the patient and the indications/risks/benefits/goals/expectations of ILR insertion have been reviewed and he wishes to proceed.  Leonia Reeves.D.

## 2017-04-13 NOTE — Progress Notes (Signed)
Physical Therapy Treatment Patient Details Name: Daniel Stuart MRN: [id number] DOB: 01-09-1966 Today's Date: 04/13/2017    History of Present Illness Pt is a 51 y/o male with no pertinent PMH. He presents to the ED with headache and R arm numbness. MRI revealed multiple small areas of acute infarct in both cerebral hemispheres most likely due to emboli.     PT Comments    Pt progressing towards physical therapy goals. Continues to demonstrate motor planning difficulties - noted specifically in getting out of bed, stepping down with LLE first during stair training, and isolating LLE during standing exercise. Minimal improvement with cues during gait training to correct quick, shuffling gait pattern with minimal knee flexion. Will continue to follow and progress as able per POC.    Follow Up Recommendations  Outpatient PT;Supervision for mobility/OOB     Equipment Recommendations  None recommended by PT    Recommendations for Other Services       Precautions / Restrictions Precautions Precautions: Fall Restrictions Weight Bearing Restrictions: No    Mobility  Bed Mobility Overal bed mobility: Modified Independent             General bed mobility comments: No assist to transition to EOB. Increased time to negotiate sheets from around feet.  Transfers Overall transfer level: Needs assistance Equipment used: None Transfers: Sit to/from Stand Sit to Stand: Supervision         General transfer comment: Supervision for safety.   Ambulation/Gait Ambulation/Gait assistance: Min guard Ambulation Distance (Feet): 200 Feet Assistive device: 1 person hand held assist;None Gait Pattern/deviations: Step-through pattern;Decreased stride length;Shuffle;Narrow base of support Gait velocity: Decreased Gait velocity interpretation: Below normal speed for age/gender General Gait Details: Decreased knee flexion and short step/stride length noted. When pt ambulating naturally,  he appeared stiff legged and almost parkinsonian. Was able to make corrective changes with VC's and achieve a more typical gait pattern, however unable to maintain.   Stairs Stairs: Yes   Stair Management: One rail Right;Step to pattern;Forwards Number of Stairs: 10 (5 x2) General stair comments: Motor planning difficulties noted with leading with the LLE.   Wheelchair Mobility    Modified Rankin (Stroke Patients Only) Modified Rankin (Stroke Patients Only) Pre-Morbid Rankin Score: No symptoms Modified Rankin: Slight disability     Balance Overall balance assessment: Needs assistance Sitting-balance support: Feet supported;No upper extremity supported Sitting balance-Leahy Scale: Good     Standing balance support: No upper extremity supported;During functional activity Standing balance-Leahy Scale: Fair                              Cognition Arousal/Alertness: Awake/alert Behavior During Therapy: WFL for tasks assessed/performed Overall Cognitive Status: Impaired/Different from baseline Area of Impairment: Attention;Memory;Awareness;Problem solving                   Current Attention Level: Selective Memory: Decreased short-term memory     Awareness: Emergent Problem Solving: Slow processing;Decreased initiation;Difficulty sequencing;Requires verbal cues General Comments: Noted motor planning difficulties and decreased attention with distractions.       Exercises  Standing at sink: x10 hamstring curls bilaterally; x10 squats. VC's for improved posture - unable to maintain upright posture even when looking at himself in the mirror.    General Comments        Pertinent Vitals/Pain Pain Assessment: No/denies pain    Home Living  Prior Function            PT Goals (current goals can now be found in the care plan section) Acute Rehab PT Goals Patient Stated Goal: Return to PLOF PT Goal Formulation: With  patient Time For Goal Achievement: 04/19/17 Potential to Achieve Goals: Good Progress towards PT goals: Progressing toward goals    Frequency    Min 4X/week      PT Plan Current plan remains appropriate    Co-evaluation              AM-PAC PT "6 Clicks" Daily Activity  Outcome Measure  Difficulty turning over in bed (including adjusting bedclothes, sheets and blankets)?: None Difficulty moving from lying on back to sitting on the side of the bed? : None Difficulty sitting down on and standing up from a chair with arms (e.g., wheelchair, bedside commode, etc,.)?: A Little Help needed moving to and from a bed to chair (including a wheelchair)?: A Little Help needed walking in hospital room?: A Little Help needed climbing 3-5 steps with a railing? : A Lot 6 Click Score: 19    End of Session Equipment Utilized During Treatment: Gait belt Activity Tolerance: Patient tolerated treatment well Patient left: in chair;with call bell/phone within reach;with chair alarm set Nurse Communication: Mobility status PT Visit Diagnosis: Unsteadiness on feet (R26.81);Other symptoms and signs involving the nervous system (R29.898)     Time: 1610-9604 PT Time Calculation (min) (ACUTE ONLY): 20 min  Charges:  $Gait Training: 8-22 mins                    G Codes:       Conni Slipper, PT, DPT Acute Rehabilitation Services Pager: [phone]    Marylynn Pearson 04/13/2017, 12:15 PM

## 2017-04-13 NOTE — Care Management Note (Signed)
Case Management Note  Patient Details  Name: Larina Earthlyimothy E Giangrande MRN: [id number] Date of Birth: 1966-08-25  Subjective/Objective:                    Action/Plan: CM consulted for outpatient therapy. [facility]. Orders placed in EPIC and information on the AVS.   Expected Discharge Date:   (unknown)               Expected Discharge Plan:     In-House Referral:     Discharge planning Services  CM Consult, Indigent Health Clinic  Post Acute Care Choice:    Choice offered to:     DME Arranged:    DME Agency:     HH Arranged:    HH Agency:     Status of Service:  In process, will continue to follow  If discussed at Long Length of Stay Meetings, dates discussed:    Additional Comments:  Kermit BaloKelli F Varun Jourdan, RN 04/13/2017, 11:44 AM

## 2017-04-13 NOTE — Interval H&P Note (Signed)
History and Physical Interval Note:  04/13/2017 3:32 PM  Daniel Stuart  has presented today for surgery, with the diagnosis of syncope  The various methods of treatment have been discussed with the patient and family. After consideration of risks, benefits and other options for treatment, the patient has consented to  Procedure(s): Loop Recorder Insertion (N/A) as a surgical intervention .  The patient's history has been reviewed, patient examined, no change in status, stable for surgery.  I have reviewed the patient's chart and labs.  Questions were answered to the patient's satisfaction.     Lewayne BuntingGregg Katrese Shell

## 2017-04-13 NOTE — Progress Notes (Signed)
STROKE TEAM PROGRESS NOTE   HISTORY OF PRESENT ILLNESS (per record) Daniel Stuart is an 51 y.o. male with no significant past medical history who presented with headache, RUE numbness, blurred vision.  The patient endorses recurrent headache RLE weakness, and non-vertiginous dizziness for the past two months.  MRI demonstrated diffuse microemboli with possible chronic underlying watershed infarcts and a chronic left ICA occlusion.   SUBJECTIVE (INTERVAL HISTORY) Patient presented with headache and right upper extremity numbness and blurred vision. He denies any prior history of strokes or TIAs but his MRI scan shows evidence of bilateral parieto-occipital cortical old lesions with encephalomalacia and gliosis possibly old infarcts. He did gives a history of aseptic meningitis at age 51 but denies significant neurological sequelae from that. He denies any history of known carotid disease but his MRI scan and carotid ultrasound suggest chronic left ICA occlusion. He states he has not seen any physician for many years.  OBJECTIVE Temp:  [97.8 F (36.6 C)-98.2 F (36.8 C)] 98 F (36.7 C) (06/13 0925) Pulse Rate:  [58-66] 66 (06/13 0925) Cardiac Rhythm: Normal sinus rhythm (06/13 0911) Resp:  [18-20] 18 (06/13 0925) BP: (96-112)/(57-73) 104/65 (06/13 0925) SpO2:  [96 %-100 %] 96 % (06/13 0925)  CBC:   Recent Labs Lab 04/11/17 1016  WBC 7.4  NEUTROABS 5.2  HGB 14.4  HCT 39.8  MCV 84.1  PLT 177    Basic Metabolic Panel:   Recent Labs Lab 04/11/17 1016 04/13/17 0429  NA 137 135  K 3.3* 3.9  CL 101 101  CO2 27 24  GLUCOSE 71 87  BUN 10 11  CREATININE 1.18 1.15  CALCIUM 8.6* 8.6*    Lipid Panel:     Component Value Date/Time   CHOL 191 04/12/2017 0552   TRIG 77 04/12/2017 0552   HDL 38 (L) 04/12/2017 0552   CHOLHDL 5.0 04/12/2017 0552   VLDL 15 04/12/2017 0552   LDLCALC 138 (H) 04/12/2017 0552   HgbA1c:  Lab Results  Component Value Date   HGBA1C 5.4 04/12/2017    Urine Drug Screen: No results found for: LABOPIA, COCAINSCRNUR, LABBENZ, AMPHETMU, THCU, LABBARB  Alcohol Level No results found for: [facility]  [facility]AGING  Dg Chest 2 View 04/11/2017 IMPRESSION: There is no acute cardiopulmonary abnormality.  Mr Brain Wo Contrast (neuro Protocol) 04/11/2017 IMPRESSION: Multiple small areas of acute infarct in both cerebral hemisphere most likely due to emboli Occlusion of left internal carotid artery. Extensive chronic ischemia bilaterally. Probable chronic watershed infarct in both cerebral hemispheres.  Mr Maxine GlennMra Head/brain Wo Cm 04/11/2017 CLINICAL DATA:  Headache and right-sided numbness EXAM: MRA HEAD WITHOUT CONTRAST IMPRESSION: 1. Occlusion of the left internal carotid artery with flow related enhancement at the carotid terminus and normal appearance of the left anterior and middle cerebral arteries, secondary to collateral flow across the complete circle of Willis. 2. Otherwise normal intracranial MRA.  VAS US Carotid duplex bilateral 04/11/2017 Summary: - Right - No evience of ICA stenosis. Vertebral artery flow is antegrade. - Left - Abnormal Doppler signal in the bulb ( spiked and No color flow or Doppler signal noted distally suggests Left ICA occlusion. No evidence of plaque or thrombus noted. Vertebral artery flow is antegrade.  2D Echo 04/11/2017 pending   PHYSICAL EXAM Pleasant middle aged caucasian male not in distress. . Afebrile. Head is nontraumatic. Neck is supple without bruit.    Cardiac exam no murmur or gallop. Lungs are clear to auscultation. Distal pulses are well felt. Neurological Exam ;  Awake  Alert oriented x 3. Normal speech and language.eye movements full without nystagmus.fundi were not visualized. Vision acuity and fields appear normal. Hearing is normal. Palatal movements are normal. Face symmetric. Tongue midline. Normal strength, tone, reflexes and coordination. Normal sensation. Gait deferred.  ASSESSMENT/PLAN Mr.  Daniel Stuart is a 51 y.o. male with no significant past medical history presenting with headache, RUE numbness, blurred vision.     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  MRA head  Left ICA occlusion with collateral flow across circle of Willis.  Carotid Doppler right ICA 1-39, % stenosis, antegrade, left ICA occlusion, VAs antegrade  2D Echo and TEE: No evidence of thrombus or PFO  TEE to look for embolic source.consulted with [facility]..  If positive for PFO (patent foramen ovale), check bilateral lower extremity venous dopplers to rule out DVT as possible source of stroke.   If TEE negative, a [facility] electrophysiologist will consult and consider placement of an implantable loop recorder to evaluate for atrial fibrillation as etiology of stroke. This has been explained to patient/family by Dr. Pearlean Brownie yesterday and Dr. Lucia Gaskins today and they are agreeable.   LDL 138  HgbA1c pending  SCDs for VTE prophylaxis Diet NPO time specified  No antithrombotic prior to admission, now on aspirin 81 mg daily. Discharge on ASA 325mg   Patient counseled to be compliant with hisantithrombotic medications  Ongoing aggressive stroke risk factor management  Therapy recommendations: Outpatient SLP;Other (comment) (intermittent supervision),   Disposition: pending  Hyperlipidemia  Home meds: none, started on atorvastatin 80 mg daily in hospital  LDL 138, goal < 70  Continue statin at discharge  Other Stroke Risk Factors  Advanced age  Cigarette smoker, advised to stop smoking  Other Active Problems  Hx of septic meningitis at age 42 per pt. No reported ICU stay or long hospitalization. Uncertain if this is cause of  laminar necrosis seen on imaging.   Hospital day # 2  Personally examined patient and images, and have participated in and made any corrections needed to history, physical, neuro exam,assessment and plan as stated above.  I have personally obtained the history, evaluated lab date, reviewed imaging studies and agree with radiology interpretations.   Stroke team will sign off. a [facility] electrophysiologist will consult and consider placement of an implantable loop recorder to evaluate for atrial fibrillation as etiology of stroke. This has been explained to patient/family by Dr. Pearlean Brownie yesterday and Dr. Lucia Gaskins today and they are agreeable.  Naomie Dean, MD Stroke Neurology  To contact Stroke Continuity provider, please refer to [email or web address]. After hours, contact General Neurology

## 2017-04-14 ENCOUNTER — Encounter (HOSPITAL_COMMUNITY): Payer: Self-pay | Admitting: Internal Medicine

## 2017-04-18 ENCOUNTER — Other Ambulatory Visit: Payer: Self-pay | Admitting: Family Medicine

## 2017-04-18 ENCOUNTER — Encounter: Payer: Self-pay | Admitting: Family Medicine

## 2017-04-18 ENCOUNTER — Other Ambulatory Visit: Payer: Self-pay | Admitting: *Deleted

## 2017-04-18 ENCOUNTER — Ambulatory Visit (INDEPENDENT_AMBULATORY_CARE_PROVIDER_SITE_OTHER): Payer: Self-pay | Admitting: Family Medicine

## 2017-04-18 VITALS — BP 128/84 | HR 98 | Temp 97.5°F | Resp 16 | Ht 66.0 in | Wt 130.0 lb

## 2017-04-18 DIAGNOSIS — Z1329 Encounter for screening for other suspected endocrine disorder: Secondary | ICD-10-CM

## 2017-04-18 DIAGNOSIS — Z21 Asymptomatic human immunodeficiency virus [HIV] infection status: Secondary | ICD-10-CM

## 2017-04-18 DIAGNOSIS — I639 Cerebral infarction, unspecified: Secondary | ICD-10-CM

## 2017-04-18 DIAGNOSIS — Z130 Encounter for screening for diseases of the blood and blood-forming organs and certain disorders involving the immune mechanism: Secondary | ICD-10-CM

## 2017-04-18 LAB — COMPLETE METABOLIC PANEL WITH GFR
ALBUMIN: 3.8 g/dL (ref 3.6–5.1)
ALK PHOS: 97 U/L (ref 40–115)
ALT: 9 U/L (ref 9–46)
AST: 13 U/L (ref 10–35)
BILIRUBIN TOTAL: 0.3 mg/dL (ref 0.2–1.2)
BUN: 6 mg/dL — AB (ref 7–25)
CALCIUM: 9 mg/dL (ref 8.6–10.3)
CO2: 25 mmol/L (ref 20–31)
CREATININE: 1.24 mg/dL (ref 0.70–1.33)
Chloride: 103 mmol/L (ref 98–110)
GFR, Est African American: 76 mL/min (ref >=60)
GFR, Est Non African American: 66 mL/min (ref >=60)
GLUCOSE: 85 mg/dL (ref 65–99)
Potassium: 3.7 mmol/L (ref 3.5–5.3)
SODIUM: 138 mmol/L (ref 135–146)
Total Protein: 7.4 g/dL (ref 6.1–8.1)

## 2017-04-18 LAB — POCT URINALYSIS DIP (DEVICE)
BILIRUBIN URINE: NEGATIVE
Glucose, UA: NEGATIVE mg/dL
Hgb urine dipstick: NEGATIVE
KETONES UR: NEGATIVE mg/dL
LEUKOCYTES UA: NEGATIVE
NITRITE: NEGATIVE
PH: 5.5 (ref 5.0–8.0)
Protein, ur: NEGATIVE mg/dL
Specific Gravity, Urine: 1.015 (ref 1.005–1.030)
Urobilinogen, UA: 0.2 mg/dL (ref 0.0–1.0)

## 2017-04-18 LAB — HIV 1/2 AB DIFFERENTIATION
HIV 1 Ab: NEGATIVE
HIV 2 AB: NEGATIVE
Note: NEGATIVE

## 2017-04-18 LAB — HIV ANTIBODY (ROUTINE TESTING W REFLEX): HIV SCREEN 4TH GENERATION: REACTIVE — AB

## 2017-04-18 LAB — RNA QUALITATIVE: HIV 1 RNA QUALITATIVE: UNDETERMINED

## 2017-04-18 LAB — THYROID PANEL WITH TSH
Free Thyroxine Index: 2.9 (ref 1.4–3.8)
T3 Uptake: 26 % (ref 22–35)
T4 TOTAL: 11.3 ug/dL (ref 4.5–12.0)
TSH: 1.34 mIU/L (ref 0.40–4.50)

## 2017-04-18 MED ORDER — BUPROPION HCL ER (SR) 150 MG PO TB12
ORAL_TABLET | ORAL | 2 refills | Status: DC
Start: 2017-04-18 — End: 2017-05-30

## 2017-04-18 NOTE — Addendum Note (Signed)
Addended by: Bing NeighborsHARRIS, Marya Lowden S on: 04/18/2017 04:04 PM   Modules accepted: Orders

## 2017-04-18 NOTE — Progress Notes (Signed)
erroneous

## 2017-04-18 NOTE — Patient Outreach (Signed)
[facility]) Care Management  04/18/2017  Larina Earthlyimothy E Robak 05/29/1966 [id number]  EMMI-Stroke RED ON EMMI ALERT DAY#: 1 DATE: 04/15/17 RED ALERT: Filled new prescriptions? No Been able to take every dose of meds? No  Spoke with patient. Reviewed and addressed red alert. Outreach attempt # 1 made to patient regarding Stroke automated phone calls. HIPAA verified with patient. Patient reported, he "picked all of his medications up from the pharmacy, as of yesterday". He reported taking his medications as prescribed. He stated, the MD prescribed a medication to assist with smoking cessation. He stated, "He hasn't picked up the medication from the drug store. He plans to pick the medication up later today". Patient verbalized receiving his discharge paperwork. He was able to understand the discharge paperwork. He didn't have any questions regarding the discharge paperwork. Patient reported having transportation to his medical appointments. Advised patient that he would continue to get automated EMMI-Stroke post discharge calls. These calls are to assess how he is doing following his recent hospitalization. Informed patient, he will receive a call from a nurse if any of his responses were abnormal. Patient voiced understanding and was appreciative of f/u call.    Plan: RN CM will notify [facility] administrative assistant regarding case closure.    Wynelle ClevelandJuanita Chinaza Rooke, RN, BSN, MHA/MSL, [facility] Telephonic Care Manager Coordinator [facility] Direct Phone: [phone] Toll Free: [phone] Fax: [phone]

## 2017-04-18 NOTE — Progress Notes (Addendum)
Patient ID: Daniel Stuart, male    DOB: [birth year], 51 y.o.   MRN: [id number]  PCP: Bing NeighborsHarris, Anthonymichael Munday S, [name]  Chief Complaint  Patient presents with  . Establish Care  . Hospitalization Follow-up    Subjective:  HPI Daniel Earthlyimothy E Perras is a 51 y.o. male presents to establish care and hospital follow-up. Medical problems include current every day smoker and hyperlipidemia. Daniel Stuart [facility] for [facility]. [facility] with complaint of headaches on and off for several months.  The headache became increasingly worse, he went to the emergency room for further evaluation and MRI of the brain confirmed left internal carotid artery occlusion and right internal carotid stenosis. He had a CVA of both cerebral hemispheres. He had a normal echo. Prior to discharge a loop recorder was inserted on his left chest wall.[facility]. He already has follow-up established with neuro rehabilitation and cardiology. Since discharge he denies any headaches although he still has some "irritation" to the right side of his head. He continues to occasionally have some blurriness of vision although he feels this is related to visual acuity and not CVA symptoms. He reports imbalance when walking although this has been a chronic problem persisting for over the last several months. He also notices prominent cognitive deficits with his ability to articulate information, process information being communicated, and he has problems with memory. He is scheduled to begin neuro rehabilitation on 05/17/2017 and he has a follow-up with cardiology on 04/21/2017 for evaluation of loop recorder site.   Social History   Social History  . Marital status: Legally Separated    Spouse name: N/A  . Number of children: N/A  . Years of education: N/A   Occupational History  . Not on file.   Social  History Main Topics  . Smoking status: Current Every Day Smoker    Packs/day: 1.00    Types: Cigarettes  . Smokeless tobacco: Never Used  . Alcohol use No  . Drug use: No  . Sexual activity: Not on file   Other Topics Concern  . Not on file   Social History Narrative  . No narrative on file    Family History  Problem Relation Age of Onset  . Diabetes Mother   . Diabetes Father   . Heart failure Father    Review of Systems See history of present illness  Patient Active Problem List   Diagnosis Date Noted  . Hyperlipidemia   . Tobacco abuse 04/12/2017  . Dyslipidemia 04/12/2017  . PVD (peripheral vascular disease) (HCC) 04/12/2017  . Carotid arterial disease (HCC) 04/12/2017  . CVA (cerebral vascular accident) (HCC) 04/11/2017    No Known Allergies  Prior to Admission medications   Medication Sig Start Date End Date Taking? Authorizing Provider  aspirin EC 81 MG EC tablet Take 1 tablet (81 mg total) by mouth daily. 04/14/17  Yes Vassie LollMadera, Carlos, MD  atorvastatin (LIPITOR) 80 MG tablet Take 1 tablet (80 mg total) by mouth daily at 6 PM. 04/14/17  Yes Vassie LollMadera, Carlos, MD  nicotine (NICODERM CQ) 21 mg/24hr patch Place 1 patch (21 mg total) onto the skin daily. 04/13/17  Yes Vassie LollMadera, Carlos, MD    Past Medical, Surgical Family and Social History reviewed and updated.    Objective:   Today's Vitals   04/18/17 0926  BP: 128/84  Pulse: 98  Resp: 16  Temp:  97.5 F (36.4 C)  TempSrc: Oral  SpO2: 98%  Weight: 130 lb (59 kg)  Height: 5\' 6"  (1.676 m)    Wt Readings from Last 3 Encounters:  04/18/17 130 lb (59 kg)  04/11/17 130 lb (59 kg)   Physical Exam  Constitutional: He is oriented to person, place, and time. He appears well-developed and well-nourished.  HENT:  Head: Normocephalic and atraumatic.  Eyes: Conjunctivae are normal. Pupils are equal, round, and reactive to light.  Cardiovascular: Normal rate, regular rhythm, normal heart sounds and intact distal  pulses.   Pulmonary/Chest: Effort normal and breath sounds normal.  Musculoskeletal: Normal range of motion.  Neurological: He is alert and oriented to person, place, and time.  Antalgic gait. Right hand grip 5/5. Left hand grip 4/5. Cerebellar function abnormal with left finger to nose test. Right finger to nose test intact.  Skin: Skin is warm and dry.  Psychiatric: He has a normal mood and affect. His behavior is normal. Judgment and thought content normal.    Assessment & Plan:  1. Cerebrovascular accident (CVA), unspecified mechanism (HCC) - COMPLETE METABOLIC PANEL WITH GFR -Keep follow-up appointment with neuro rehabilitation. -Keep follow-up appointment with cardiology to evaluate loop recorder site. -Complete patient care assistance form in order to gain some form of financial assistance. Patient likely needs follow-up with neuro and will need some speech therapy. Will wait for financial assistance form completion and submission prior to ordering these additional specialties.  2. Screening for thyroid disorder - Thyroid Panel With TSH  3. Screening for deficiency anemia - CBC with Differential   RTC: Fasting lipid panel and chronic disease management.  Godfrey Pick. Tiburcio Pea, MSN, [name]-C [facility]  [address] [place], [place] [address] [phone]

## 2017-04-18 NOTE — Patient Instructions (Signed)
For smoke cessation continue using nicotine patches. I would also like you to start Wellbutrin 150 mg twice a day. If you are continuing to smoke avoid using nicotine patches at the same time.   Steps to Quit Smoking Smoking tobacco can be bad for your health. It can also affect almost every organ in your body. Smoking puts you and people around you at risk for many serious long-lasting (chronic) diseases. Quitting smoking is hard, but it is one of the best things that you can do for your health. It is never too late to quit. What are the benefits of quitting smoking? When you quit smoking, you lower your risk for getting serious diseases and conditions. They can include:  Lung cancer or lung disease.  Heart disease.  Stroke.  Heart attack.  Not being able to have children (infertility).  Weak bones (osteoporosis) and broken bones (fractures).  If you have coughing, wheezing, and shortness of breath, those symptoms may get better when you quit. You may also get sick less often. If you are pregnant, quitting smoking can help to lower your chances of having a baby of low birth weight. What can I do to help me quit smoking? Talk with your doctor about what can help you quit smoking. Some things you can do (strategies) include:  Quitting smoking totally, instead of slowly cutting back how much you smoke over a period of time.  Going to in-person counseling. You are more likely to quit if you go to many counseling sessions.  Using resources and support systems, such as: ? Agricultural engineernline chats with a Veterinary surgeoncounselor. ? Phone quitlines. ? Automotive engineerrinted self-help materials. ? Support groups or group counseling. ? Text messaging programs. ? Mobile phone apps or applications.  Taking medicines. Some of these medicines may have nicotine in them. If you are pregnant or breastfeeding, do not take any medicines to quit smoking unless your doctor says it is okay. Talk with your doctor about counseling or other  things that can help you.  Talk with your doctor about using more than one strategy at the same time, such as taking medicines while you are also going to in-person counseling. This can help make quitting easier. What things can I do to make it easier to quit? Quitting smoking might feel very hard at first, but there is a lot that you can do to make it easier. Take these steps:  Talk to your family and friends. Ask them to support and encourage you.  Call phone quitlines, reach out to support groups, or work with a Veterinary surgeoncounselor.  Ask people who smoke to not smoke around you.  Avoid places that make you want (trigger) to smoke, such as: ? Bars. ? Parties. ? Smoke-break areas at work.  Spend time with people who do not smoke.  Lower the stress in your life. Stress can make you want to smoke. Try these things to help your stress: ? Getting regular exercise. ? Deep-breathing exercises. ? Yoga. ? Meditating. ? Doing a body scan. To do this, close your eyes, focus on one area of your body at a time from head to toe, and notice which parts of your body are tense. Try to relax the muscles in those areas.  Download or buy apps on your mobile phone or tablet that can help you stick to your quit plan. There are many free apps, such as QuitGuide from the [employer] developer(Centers for Disease Control and Prevention). You can find more support from smokefree.gov and  other websites.  This information is not intended to replace advice given to you by your health care provider. Make sure you discuss any questions you have with your health care provider. Document Released: 08/14/2009 Document Revised: 06/15/2016 Document Reviewed: 03/04/2015 Elsevier Interactive Patient Education  2018 [employer].

## 2017-04-19 LAB — CBC WITH DIFFERENTIAL/PLATELET
BASOS ABS: 0 {cells}/uL (ref 0–200)
Basophils Relative: 0 %
EOS PCT: 1 %
Eosinophils Absolute: 103 cells/uL (ref 15–500)
HCT: 43.4 % (ref 38.5–50.0)
HEMOGLOBIN: 14.2 g/dL (ref 13.2–17.1)
LYMPHS PCT: 19 %
Lymphs Abs: 1957 cells/uL (ref 850–3900)
MCH: 29.6 pg (ref 27.0–33.0)
MCHC: 32.7 g/dL (ref 32.0–36.0)
MCV: 90.4 fL (ref 80.0–100.0)
MPV: 9 fL (ref 7.5–12.5)
Monocytes Absolute: 515 cells/uL (ref 200–950)
Monocytes Relative: 5 %
NEUTROS PCT: 75 %
Neutro Abs: 7725 cells/uL (ref 1500–7800)
Platelets: 265 10*3/uL (ref 140–400)
RBC: 4.8 MIL/uL (ref 4.20–5.80)
RDW: 14.6 % (ref 11.0–15.0)
WBC: 10.3 10*3/uL (ref 3.8–10.8)

## 2017-04-19 LAB — HIV ANTIBODY (ROUTINE TESTING W REFLEX): HIV: NONREACTIVE

## 2017-04-21 ENCOUNTER — Ambulatory Visit (INDEPENDENT_AMBULATORY_CARE_PROVIDER_SITE_OTHER): Payer: Self-pay | Admitting: *Deleted

## 2017-04-21 ENCOUNTER — Other Ambulatory Visit: Payer: Self-pay | Admitting: *Deleted

## 2017-04-21 DIAGNOSIS — I639 Cerebral infarction, unspecified: Secondary | ICD-10-CM

## 2017-04-21 LAB — CUP PACEART INCLINIC DEVICE CHECK
Date Time Interrogation Session: 20180621134013
MDC IDC PG IMPLANT DT: 20180613

## 2017-04-21 NOTE — Patient Instructions (Signed)
Please call Device Clinic for further questions or concerns:  [phone]

## 2017-04-21 NOTE — Patient Outreach (Signed)
[facility]) Care Management  04/21/2017  Larina Earthlyimothy E Fromer [birth year] [id number]   EMMI-Stroke RED ON EMMI ALERT DAY#: 6 DATE: 04/20/17 RED ALERT: Smoked or been around smoke? Yes   Outreach attempt #1 to patient. No answer. RN CM left HIPAA compliant message along with contact info.      Plan: RN CM will contact patient within the next business day.   Wynelle ClevelandJuanita Journie Howson, RN, BSN, MHA/MSL, [facility] Telephonic Care Manager Coordinator [facility] Direct Phone: [phone] Toll Free: [phone] Fax: [phone]

## 2017-04-21 NOTE — Progress Notes (Signed)
Wound check appointment. Steri-strips removed. Wound without redness or edema. Incision edges approximated, wound well healed. Battery status: Good. R-waves 0.2073mV. 0 symptom episodes, 0 tachy episodes, 0 pause episodes, 0 brady episodes. 0 AF episodes (0% burden). Patient educated on wound care. Monthly summary reports and ROV with GT PRN.

## 2017-04-22 ENCOUNTER — Other Ambulatory Visit: Payer: Self-pay | Admitting: *Deleted

## 2017-04-22 NOTE — Patient Outreach (Signed)
[facility]) Care Management  04/22/2017  Larina Earthlyimothy E Ficken 1966/04/27 [id number]  EMMI-Stroke RED ON EMMI ALERT DAY#: 6 DATE: 04/20/17 RED ALERT: Smoked or been around smoke? Yes   Spoke with patient. Reviewed and addressed red alert. Outreach attempt # 2 spoke with patient regarding EMMI automated phone calls. HIPAA verified with patient. Patient reported, he continues to smoke. He was prescribed nicotine patches. He verbalized attempting to stop smoking without success. He is smoking around 3 to 4 cigarettes per day. Educated patient about smoking can cause a stroke. Patient reported, he is aware that smoking can cause a stroke. Patient received and understood educational materials on smoking cessation with his discharge paperwork.     Plan: RN CM will notify [facility] administrative assistant regarding case closure.  RN CM advised patient to contact RNCM for any needs or concerns.  Wynelle ClevelandJuanita Markus Casten, RN, BSN, MHA/MSL, [facility] Telephonic Care Manager Coordinator [facility] Direct Phone: [phone] Toll Free: [phone] Fax: [phone]

## 2017-04-25 ENCOUNTER — Telehealth: Payer: Self-pay | Admitting: Family Medicine

## 2017-04-25 NOTE — Telephone Encounter (Signed)
Call patient to advise his HIV with reflex was performed a second time and were all non-reactive. Patient was unaware of the inpatient reactive HIV antibody.  Explained that I'm uncertain as to the reason the impatient HIV was reactive and the testing I completed was non-reactive. Patient agreed to be retested in 90 days.   Godfrey PickKimberly S. Tiburcio PeaHarris, MSN, [name]-C [facility]  [address] [place], [place] [address] [phone]

## 2017-04-27 ENCOUNTER — Inpatient Hospital Stay: Payer: Self-pay

## 2017-04-28 ENCOUNTER — Other Ambulatory Visit: Payer: Self-pay | Admitting: *Deleted

## 2017-04-28 NOTE — Patient Outreach (Signed)
[facility]) Care Management  04/28/2017  Daniel Earthlyimothy E Finchum 10/08/1966 [id number]   EMMI-Stroke RED ON EMMI ALERT DAY#:13 DATE: 04/27/17 RED ALERT: Micah FlesherWent to follow-up appointment? No   Spoke with patient. Reviewed and addressed red alert. Patient reported, he was able to make his follow-up appointment on 04/18/17. He continues to drive and is capable of transporting himself to all medical appointments. He has an upcoming appointment with Dr. Tiburcio PeaHarris on 05/30/17. He will start Physical Therapy on 05/16/17.  Patient stated, he stopped smoking completely. He is using the nicotine patches prescribed by Dr. Tiburcio PeaHarris. He spoke about combining smoking and nicotine patches together could be very toxic to his health. Patient had no questions or concerns at this time.  Plan: RN CM will notify [facility] CM administrative assistant regarding case closure.   Wynelle ClevelandJuanita Timon Geissinger, RN, BSN, MHA/MSL, [facility] Telephonic Care Manager Coordinator [facility] Direct Phone: [phone] Toll Free: [phone] Fax: [phone]

## 2017-05-13 ENCOUNTER — Ambulatory Visit (INDEPENDENT_AMBULATORY_CARE_PROVIDER_SITE_OTHER): Payer: Self-pay | Admitting: *Deleted

## 2017-05-13 DIAGNOSIS — I639 Cerebral infarction, unspecified: Secondary | ICD-10-CM

## 2017-05-16 ENCOUNTER — Ambulatory Visit: Payer: Medicaid Other | Attending: Internal Medicine | Admitting: Occupational Therapy

## 2017-05-16 ENCOUNTER — Ambulatory Visit: Payer: Medicaid Other | Admitting: Physical Therapy

## 2017-05-16 DIAGNOSIS — R2681 Unsteadiness on feet: Secondary | ICD-10-CM | POA: Diagnosis present

## 2017-05-16 DIAGNOSIS — R278 Other lack of coordination: Secondary | ICD-10-CM

## 2017-05-16 DIAGNOSIS — R2689 Other abnormalities of gait and mobility: Secondary | ICD-10-CM

## 2017-05-16 DIAGNOSIS — I69318 Other symptoms and signs involving cognitive functions following cerebral infarction: Secondary | ICD-10-CM

## 2017-05-16 DIAGNOSIS — R482 Apraxia: Secondary | ICD-10-CM | POA: Diagnosis present

## 2017-05-16 DIAGNOSIS — R41842 Visuospatial deficit: Secondary | ICD-10-CM | POA: Diagnosis present

## 2017-05-16 DIAGNOSIS — M6281 Muscle weakness (generalized): Secondary | ICD-10-CM | POA: Diagnosis present

## 2017-05-16 DIAGNOSIS — R41844 Frontal lobe and executive function deficit: Secondary | ICD-10-CM

## 2017-05-16 DIAGNOSIS — R41840 Attention and concentration deficit: Secondary | ICD-10-CM

## 2017-05-16 NOTE — Progress Notes (Signed)
Carelink Summary Report / Loop Recorder 

## 2017-05-17 ENCOUNTER — Telehealth: Payer: Self-pay | Admitting: Occupational Therapy

## 2017-05-17 ENCOUNTER — Other Ambulatory Visit: Payer: Self-pay | Admitting: Family Medicine

## 2017-05-17 DIAGNOSIS — I69328 Other speech and language deficits following cerebral infarction: Secondary | ICD-10-CM

## 2017-05-17 NOTE — Therapy (Signed)
[facility] [address] [place], [place], [address] Phone: [phone]   Fax:  [phone]  Occupational Therapy Evaluation  Patient Details  Name: Daniel Stuart MRN: [id number] Date of Birth: Jun 15, 1966 Referring Provider: Joaquin Courts, [name] (PCP)  Encounter Date: 05/16/2017      OT End of Session - 05/17/17 0912    Visit Number 1   Number of Visits 9   Date for OT Re-Evaluation 07/16/17   Authorization Type self pay   OT Start Time 0933   OT Stop Time 1020   OT Time Calculation (min) 47 min   Activity Tolerance Patient tolerated treatment well   Behavior During Therapy [facility] for tasks assessed/performed      No past medical history on file.  Past Surgical History:  Procedure Laterality Date  . FOOT SURGERY    . LOOP RECORDER INSERTION N/A 04/13/2017   Procedure: Loop Recorder Insertion;  Surgeon: Marinus Maw, MD;  [facility] INVAS[facility] LAB;  Service: Cardiovascular;  Laterality: N/A;  . TEE WITHOUT CARDIOVERSION N/A 04/13/2017   Procedure: TRANSESOPHAGEAL ECHOCARDIOGRAM (TEE);  Surgeon: Jake Bathe, MD;  Location: [facility] ENDOSCOPY;  Service: Cardiovascular;  Laterality: N/A;    There were no vitals filed for this visit.      Subjective Assessment - 05/16/17 0938    Subjective  Pt reports that he does not feel comfortable driving, but that he was not told one way or the other   Pertinent History bilateral CVA hosptialized 04/11/17; hx of multiple TIAs   Patient Stated Goals unknown   Currently in Pain? No/denies           [facility] OT Assessment - 05/17/17 0001      Assessment   Diagnosis stroke   Referring Provider Joaquin Courts, [name]  PCP   Onset Date 04/11/17  symtoms likely for 3-4 mo. prior   Prior Therapy acute OT, PT, ST evals     Precautions   Precautions Fall   Precaution Comments pt reports that MD didn't      Balance Screen   Has the patient fallen in the past 6 months Yes    How many times? 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   Vocation Requirements pt reports that he was having difficulty with computer work and inventory counts prior to hospitalization     ADL   Eating/Feeding Modified independent   Grooming Modified independent   Upper Body Bathing Modified independent   Lower Body Bathing Increased time   Upper Body Dressing Independent   Lower Body Dressing Modified independent  sitting for donning pants   Toilet Transfer Modified independent   Toileting - Clothing Manipulation Modified independent   Toileting -  Hygiene Modified Independent   Tub/Shower Transfer Modified independent  incr time   Psychologist, educational --  tub/shower combo     IADL   Prior Level of Function Shopping goes with mother   Shopping --  goes with mother, but able to participate mod I.   Prior Level of Function Light Housekeeping pt performing vacuuming  but not doing  as much as before, may need a rest break   Prior Level of Function Meal Prep mother performed   Community Mobility Drives own vehicle  but doesn't feel comfortable/OT recommended against driving   Medication Management Is responsible for taking medication in correct dosages at correct time   Prior Level of Function Financial Management independent   Financial Management --  mother/sister supervises or assists prn     Mobility   Mobility Status History of falls  ambulates without device     Written Expression   Dominant Hand Right   Handwriting 100% legible  significant incr time to copy, line alignment off     Vision - History   Baseline Vision Wears glasses only  for reading  from drugstore   Additional Comments initial blurrines, however, pt reports it is at baseline     Vision Assessment   Vision Assessment --  not fully assessed, TBA further in functional context prn   Comment Environmental scanning with approx 73% accuracy in min distracting environment     Activity Tolerance   Activity Tolerance Comments needs rest after 10-43min     Cognition   Overall Cognitive Status Impaired/Different from baseline  to be assessed further in functional context prn   Area of Impairment Memory;Safety/judgement;Problem solving;Attention;Awareness   Current Attention Level Selective   Memory Decreased short-term memory   Safety/Judgement Decreased awareness of safety;Decreased awareness of deficits  falls and was driving although knew he had cognitive change   Awareness Intellectual;Emergent   Problem Solving Slow processing   Behaviors --  inconsistent reports at time, difficulty articulating      Sensation   Light Touch Appears Intact  per pt report, initial numbness     Coordination   Coordination and Movement Description decr for opposition to each digit   9 Hole Peg Test Right;Left   Right 9 Hole Peg Test 46.0   Left 9 Hole Peg Test 46.19   Coordination ?ataxia, particularly LEs     Praxis   Praxis Impaired   Praxis Impairment Details Motor planning     AROM   Overall AROM  Within functional limits for tasks performed     Strength   Overall Strength Deficits   Overall Strength Comments RUE shouder strength grossly 4/5 and LUE shoulder strength grossly 3+ to 4-/5   BUEs biceps/triceps grossly 5/5     Hand Function   Right Hand Grip (lbs) 76   Left Hand Grip (lbs) 65                         OT Education - 05/16/17 1733    Education provided Yes   Education Details Recommendation that pt not drive due to slowed processing time and decr environmental scanning, reaction time, and coordination; OT eval results and  POC; Recommendation for Speech therapy (pt agreed) as it was recommended in the hospital (however, no referral entered).   Person(s) Educated Patient   Methods Explanation   Comprehension Verbalized understanding          OT Short Term Goals - 05/17/17 0929      OT SHORT TERM GOAL #1   Title Pt will be independent with initial HEP for UE strength/coordination.--check STGs 06/16/17   Time 4   Period Weeks   Status New     OT SHORT TERM GOAL #2   Title Pt will verbalize understanding of visual and cognitive compensation strategies for ADLs.   Time 4  Period Weeks   Status New     OT SHORT TERM GOAL #3   Title Pt will be able to perfom home maintenance task for at least prior to rest break.   Baseline 10-15 min currently   Time 4   Period Weeks   Status New     OT SHORT TERM GOAL #4   Title Pt will improve coordination for ADLs as shown by improving time on 9-hole peg test by at least 10sec with each UE.   Baseline R-46, L-46.19sec   Time 4   Period Weeks   Status New           OT Long Term Goals - 05/17/17 0934      OT LONG TERM GOAL #1   Title Pt will be independent with updated HEP--check LTGs 07/16/17   Time 8   Period Weeks   Status New     OT LONG TERM GOAL #2   Title Pt will perform environmental scanning with at least 90% accuracy in moderately busy environment for improved safety for community activiites.   Baseline 73% in minimal distracting environment   Time 8   Period Weeks   Status New     OT LONG TERM GOAL #3   Title Pt will perform simple-mod complex financial management tasks mod I.   Baseline 10-15 min currently   Time 8   Period Weeks   Status New     OT LONG TERM GOAL #4   Title Pt will be able to perfom home maintenance task for at least prior to rest break.   Time 8   Period Weeks   Status New               Plan - 05/17/17 0913    Clinical Impression Statement Pt presents with decr strength, decr activity  tolerance, decr balance/functional mobility, cognitive deficits, decr coordination, motor planning difficulties, visual perceptual deficits.  Pt would benefit from occupational therapy to address these deficits in order to improve ADL/IADL performance and improve quality of life.   Occupational Profile and client history currently impacting functional performance Pt is a 51 y.o. male s/p CVA (MRI revealed multiple small areas of acute infarct in both cerebral hemispheres) with hospitalization 04/11/17-04/13/17).  Pt reports that he was told that he had multiple TIAs.  Pt also with Hyperlipidemia, Carotid arterial disease, peripheral vascular disease.  Pt was working at [employer] full time, but was having difficulty cognitively (likely due to TIAs).  Pt lives with his mother, but was independent with ADLs/IADLs prior to CVA.     Occupational performance deficits (Please refer to evaluation for details): ADL's;IADL's;Social Participation;Work;Leisure   Rehab Potential Good   Current Impairments/barriers affecting progress: pt financial concerns (self-pay)   OT Frequency 1x / week  recommended 2x/wk however, pt requests 1x/wk due to financial concerns   OT Duration 8 weeks  +eval   OT Treatment/Interventions Self-care/ADL training;Cryotherapy;Parrafin;Therapeutic exercise;DME and/or AE instruction;Building services engineer;Therapeutic activities;Patient/family education;Cognitive remediation/compensation;Balance training;Manual Therapy;Neuromuscular education;Fluidtherapy;Ultrasound;Moist Heat;Energy conservation;Passive range of motion;Therapeutic exercises;Visual/perceptual remediation/compensation   Plan ?referral to Vocational Rehab; initiate HEP for coordination and shoulder strength; cognitive HEP   Clinical Decision Making Limited treatment options, no task modification necessary   Recommended Other Services receiving PT; recommend ST due to speech and cognitive deficits (recommended by  acute ST per Epic notes)   Consulted and Agree with Plan of Care Patient      Patient will benefit from skilled therapeutic intervention  in order to improve the following deficits and impairments:  Decreased balance, Decreased activity tolerance, Decreased coordination, Decreased knowledge of use of DME, Decreased safety awareness, Decreased strength, Impaired UE functional use, Decreased knowledge of precautions, Decreased cognition, Decreased endurance, Decreased mobility, Difficulty walking, Impaired vision/preception  Visit Diagnosis: Muscle weakness (generalized)  Other lack of coordination  Visuospatial deficit  Other symptoms and signs involving cognitive functions following cerebral infarction  Attention and concentration deficit  Apraxia  Frontal lobe and executive function deficit  Unsteadiness on feet  Other abnormalities of gait and mobility    Problem List Patient Active Problem List   Diagnosis Date Noted  . Hyperlipidemia   . Tobacco abuse 04/12/2017  . Dyslipidemia 04/12/2017  . PVD (peripheral vascular disease) (HCC) 04/12/2017  . Carotid arterial disease (HCC) 04/12/2017  . CVA (cerebral vascular accident) [facility](HCC) 04/11/2017    [facility] Joseph[facility] HospitalFREEMAN,Zymir Napoli 05/17/2017, 9:51 AM  [facility] [address] [place], [place], [address] Phone: [phone]   Fax:  [phone]  Name: Larina Earthlyimothy E Pagnotta MRN: [id number] Date of Birth: December 17, 1965   Willa FraterAngela Pocahontas Cohenour, OTR/L [facility] [address] [place], [place]  [address] [phone] phone [phone] 05/17/17 9:51 AM

## 2017-05-17 NOTE — Therapy (Signed)
[facility] [address] [place], [place], [address] Phone: [phone]   Fax:  [phone]  Physical Therapy Evaluation  Patient Details  Name: Daniel Stuart MRN: [id number] Date of Birth: 05/07/1966 Referring Provider: Joaquin Courts, [name]  Encounter Date: 05/16/2017      PT End of Session - 05/17/17 2028    Visit Number 1   Number of Visits 5   Date for PT Re-Evaluation 06/17/17   Authorization Type self pay   PT Start Time 1017   PT Stop Time 1100   PT Time Calculation (min) 43 min      No past medical history on file.  Past Surgical History:  Procedure Laterality Date  . FOOT SURGERY    . LOOP RECORDER INSERTION N/A 04/13/2017   Procedure: Loop Recorder Insertion;  Surgeon: Marinus Maw, MD;  Location: [facility] INVASIVE CV LAB;  Service: Cardiovascular;  Laterality: N/A;  . TEE WITHOUT CARDIOVERSION N/A 04/13/2017   Procedure: TRANSESOPHAGEAL ECHOCARDIOGRAM (TEE);  Surgeon: Jake Bathe, MD;  Location: [facility] ENDOSCOPY;  Service: Cardiovascular;  Laterality: N/A;    There were no vitals filed for this visit.       Subjective Assessment - 05/17/17 2018    Subjective Pt presents to PT s/p CVA on 04-11-17: was hospitalized until 04-13-17; reports he has difficulty getting his feet to move as he wants them to; pt amb. without device   Pertinent History PVD; carotid aterial disease   Patient Stated Goals "get back like I was"   Currently in Pain? No/denies            [facility] PT Assessment - 05/17/17 2021      Assessment   Medical Diagnosis CVA   Referring Provider Joaquin Courts, [name]   Onset Date/Surgical Date 04/11/17     Precautions   Precautions Fall   Precaution Comments pt reports that MD didn't      Balance Screen   Has the patient fallen in the past 6 months Yes   How many times? 3   Has the patient had a decrease in activity level because of a fear of falling?  No   Is the patient  reluctant to leave their home because of a fear of falling?  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   Vocation Requirements pt reports that he was having difficulty with computer work and inventory counts prior to hospitalization     Cognition   Problem Solving Slow processing     ROM / Strength   AROM / PROM / Strength Strength     AROM   Overall AROM  Within functional limits for tasks performed     Strength   Strength Assessment Site Hip   Right Hip Extension 3+/5   Left Hip Extension 3+/5     Ambulation/Gait   Ambulation/Gait Yes   Ambulation/Gait Assistance 5: Supervision   Ambulation Distance (Feet) 100 Feet   Assistive device None   Gait Pattern Ataxic;Decreased step length - left;Decreased step length - right   Ambulation Surface Level;Indoor   Gait velocity 13.84 = 2.37     Standardized Balance Assessment   Standardized Balance Assessment Berg Balance Test;Timed Up and Go Test     Berg Balance Test   Sit to Stand Able to stand without using hands and stabilize independently   Standing Unsupported Able  to stand safely 2 minutes   Sitting with Back Unsupported but Feet Supported on Floor or Stool Able to sit safely and securely 2 minutes   Stand to Sit Sits safely with minimal use of hands   Transfers Able to transfer safely, minor use of hands   Standing Unsupported with Eyes Closed Able to stand 10 seconds safely   Standing Ubsupported with Feet Together Able to place feet together independently and stand 1 minute safely   From Standing, Reach Forward with Outstretched Arm Can reach confidently >25 cm (10")   From Standing Position, Pick up Object from Floor Able to pick up shoe safely and easily   From Standing Position, Turn to Look Behind Over each Shoulder Looks behind from both sides and weight shifts well   Turn 360 Degrees Able to turn 360 degrees safely  but slowly  L=4.84   R=5.8   Standing Unsupported, Alternately Place Feet on Step/Stool Able to complete >2 steps/needs minimal assist   Standing Unsupported, One Foot in Front Able to take small step independently and hold 30 seconds   Standing on One Leg Able to lift leg independently and hold > 10 seconds  1 sec on RLE   Total Score 49     Timed Up and Go Test   Normal TUG (seconds) 15.34            Objective measurements completed on examination: See above findings.        Pt instructed in coordination ex of touching 3 targets (i.e. Paper plates) with each foot in standing for improved balance and coordination Pt demonstrated understanding of this exercise for HEP              PT Long Term Goals - 05/17/17 2349      PT LONG TERM GOAL #1   Title Independent in HEP for balance exercises.  06-16-17   Time 4   Period Weeks   Status New     PT LONG TERM GOAL #2   Title Improve TUG score to </= 13.0 secs without device for reduced fall risk.  06-16-17   Time 4   Period Weeks   Status New     PT LONG TERM GOAL #3   Title Increase gait velocity from 2.37 ft/sec to >/= 2.8 ft/sec for increased gait efficiency.  06-16-17   Time 4   Period Weeks   Status New     PT LONG TERM GOAL #4   Title Amb. 500' on flat, even/uneven surfaces with SBA without LOB.  06-16-17   Time 4   Period Weeks   Status New                Plan - 05/17/17 2029    Clinical Impression Statement Pt is a 51 yr old male s/p CVA on 04-11-17; pt presents with ataxic gait pattern, decreased coordination and apraxia with decr. processing and motor planning.  Pt also presents with decr. high level balance skills and is a moderate fall risk per TUG  with score 15.34 seconds.    History and Personal Factors relevant to plan of care: pt is self pay (financial limitations limiting PT visits per pt's request): pt has had several TIA's as wel as CVA   Clinical Presentation due to: CVA with  several TIA's   Clinical Decision Making Moderate   Rehab Potential Good   Clinical Impairments Affecting Rehab Potential ataxia, apraxia, coordination deficits   PT Frequency 1x / week  pt's request due to lack of insurance   PT Duration 4 weeks   PT Treatment/Interventions ADLs/Self Care Home Management;Gait training;Stair training;Therapeutic activities;Therapeutic exercise;Balance training;Neuromuscular re-education;Patient/family education   PT Next Visit Plan balance HEP;check coordination ex for HEP given at eval on 05-16-17 (touching targets); gait training with no device   PT Home Exercise Plan see above   Recommended Other Services Pt is receiving OT:  speech recommended:  VR referral   Consulted and Agree with Plan of Care Patient      Patient will benefit from skilled therapeutic intervention in order to improve the following deficits and impairments:  Abnormal gait, Decreased balance, Decreased coordination, Decreased strength, Decreased cognition  Visit Diagnosis: Muscle weakness (generalized) - Plan: PT plan of care cert/re-cert  Other lack of coordination - Plan: PT plan of care cert/re-cert  Other abnormalities of gait and mobility - Plan: PT plan of care cert/re-cert     Problem List Patient Active Problem List   Diagnosis Date Noted  . Hyperlipidemia   . Tobacco abuse 04/12/2017  . Dyslipidemia 04/12/2017  . PVD (peripheral vascular disease) (HCC) 04/12/2017  . Carotid arterial disease (HCC) 04/12/2017  . CVA (cerebral vascular accident) (HCC) 04/11/2017    Megham Dwyer, Donavan Burnet, PT 05/17/2017, 11:57 PM  [facility] [address] [place], [place], [address] Phone: [phone]   Fax:  [phone]  Name: OBE AHLERS MRN: [id number] Date of Birth: 02-16-1966

## 2017-05-17 NOTE — Telephone Encounter (Signed)
Joaquin CourtsKimberly Harris, [name]:  Mr. Roanna EpleyGarland was seen for occupational therapy evaluation on 05/16/17.  Per Epic notes, Speech therapy was also recommended due to cognitive deficits.  Pt also reports/demonstrates decreased articulation/ability to express his thoughts.  If you agree, please send speech therapy referral via Epic.  Thank you,  Willa FraterAngela Freeman, OTR/L [facility] [address] [place], [place]  [address] [phone] phone [phone] 05/17/17 10:02 AM

## 2017-05-17 NOTE — Progress Notes (Signed)
Speech therapy referral placed today.

## 2017-05-18 NOTE — Telephone Encounter (Signed)
Order for speech therapy has been placed.

## 2017-05-19 LAB — CUP PACEART REMOTE DEVICE CHECK
Date Time Interrogation Session: 20180713214247
Implantable Pulse Generator Implant Date: 20180613

## 2017-05-26 ENCOUNTER — Ambulatory Visit: Payer: Medicaid Other | Admitting: Occupational Therapy

## 2017-05-26 ENCOUNTER — Encounter: Payer: Self-pay | Admitting: Occupational Therapy

## 2017-05-26 DIAGNOSIS — R278 Other lack of coordination: Secondary | ICD-10-CM

## 2017-05-26 DIAGNOSIS — R41842 Visuospatial deficit: Secondary | ICD-10-CM

## 2017-05-26 DIAGNOSIS — I69318 Other symptoms and signs involving cognitive functions following cerebral infarction: Secondary | ICD-10-CM

## 2017-05-26 DIAGNOSIS — R41840 Attention and concentration deficit: Secondary | ICD-10-CM

## 2017-05-26 DIAGNOSIS — R2681 Unsteadiness on feet: Secondary | ICD-10-CM

## 2017-05-26 DIAGNOSIS — R482 Apraxia: Secondary | ICD-10-CM

## 2017-05-26 DIAGNOSIS — R41844 Frontal lobe and executive function deficit: Secondary | ICD-10-CM

## 2017-05-26 DIAGNOSIS — M6281 Muscle weakness (generalized): Secondary | ICD-10-CM

## 2017-05-26 DIAGNOSIS — R2689 Other abnormalities of gait and mobility: Secondary | ICD-10-CM

## 2017-05-26 NOTE — Therapy (Signed)
[facility] [address] [place], [place], [address] Phone: [phone]   Fax:  [phone]  Occupational Therapy Treatment  Patient Details  Name: Daniel Stuart MRN: [id number] Date of Birth: [birth year] Referring Provider: Joaquin CourtsKimberly Harris, [name] (PCP)  Encounter Date: 05/26/2017      OT End of Session - 05/26/17 1026    Visit Number 2   Number of Visits 9   Date for OT Re-Evaluation 07/16/17   Authorization Type self pay   OT Start Time 1024   OT Stop Time 1104   OT Time Calculation (min) 40 min   Activity Tolerance Patient tolerated treatment well   Behavior During Therapy [facility] for tasks assessed/performed      No past medical history on file.  Past Surgical History:  Procedure Laterality Date  . FOOT SURGERY    . LOOP RECORDER INSERTION N/A 04/13/2017   Procedure: Loop Recorder Insertion;  Surgeon: Marinus Mawaylor, Gregg W, MD;  [facility];  Service: Cardiovascular;  Laterality: N/A;  . TEE WITHOUT CARDIOVERSION N/A 04/13/2017   Procedure: TRANSESOPHAGEAL ECHOCARDIOGRAM (TEE);  Surgeon: Jake BatheSkains, Mark C, MD;  [facility];  Service: Cardiovascular;  Laterality: N/A;    There were no vitals filed for this visit.      Subjective Assessment - 05/26/17 1025    Subjective  Pt reports that he does not feel comfortable driving, but that he was not told one way or the other   Pertinent History bilateral CVA hosptialized 04/11/17; hx of multiple TIAs   Patient Stated Goals walking, memory   Currently in Pain? No/denies       Recommended pt practice writing/journaling a few sentences a day for memory and coordination.                       OT Education - 05/26/17 1027    Education Details Coordination HEP; Yellow Theraband HEP--see pt instructions   Person(s) Educated Patient   Methods Explanation;Demonstration;Verbal cues;Handout   Comprehension Verbalized  understanding;Returned demonstration          OT Short Term Goals - 05/17/17 0929      OT SHORT TERM GOAL #1   Title Pt will be independent with initial HEP for UE strength/coordination.--check STGs 06/16/17   Time 4   Period Weeks   Status New     OT SHORT TERM GOAL #2   Title Pt will verbalize understanding of visual and cognitive compensation strategies for ADLs.   Time 4   Period Weeks   Status New     OT SHORT TERM GOAL #3   Title Pt will be able to perfom home maintenance task for at least 20min prior to rest break.   Baseline 10-15 min currently   Time 4   Period Weeks   Status New     OT SHORT TERM GOAL #4   Title Pt will improve coordination for ADLs as shown by improving time on 9-hole peg test by at least 10sec with each UE.   Baseline R-46, L-46.19sec   Time 4   Period Weeks   Status New           OT Long Term Goals - 05/17/17 0934      OT LONG TERM GOAL #1   Title Pt will be independent with updated HEP--check LTGs 07/16/17   Time 8   Period Weeks   Status New     OT LONG TERM GOAL #  2   Title Pt will perform environmental scanning with at least 90% accuracy in moderately busy environment for improved safety for community activiites.   Baseline 73% in minimal distracting environment   Time 8   Period Weeks   Status New     OT LONG TERM GOAL #3   Title Pt will perform simple-mod complex financial management tasks mod I.   Baseline 10-15 min currently   Time 8   Period Weeks   Status New     OT LONG TERM GOAL #4   Title Pt will be able to perfom home maintenance task for at least 30min prior to rest break.   Time 8   Period Weeks   Status New               Plan - 05/26/17 1047    Clinical Impression Statement Pt verbalized understanding of HEP after instruction.     Rehab Potential Good   Current Impairments/barriers affecting progress: pt financial concerns (self-pay)   OT Frequency 1x / week  recommended 2x/wk however, pt  requests 1x/wk due to financial concerns   OT Duration 8 weeks  +eval   OT Treatment/Interventions Self-care/ADL training;Cryotherapy;Parrafin;Therapeutic exercise;DME and/or AE instruction;Building services engineerunctional Mobility Training;Therapeutic activities;Patient/family education;Cognitive remediation/compensation;Balance training;Manual Therapy;Neuromuscular education;Fluidtherapy;Ultrasound;Moist Heat;Energy conservation;Passive range of motion;Therapeutic exercises;Visual/perceptual remediation/compensation   Plan cognitive HEP/memory compensation strategies, visual scanning, ?referral to Vocational Rehab   OT Home Exercise Plan Education provided:  Coordination and Yellow theraband HEP   Consulted and Agree with Plan of Care Patient      Patient will benefit from skilled therapeutic intervention in order to improve the following deficits and impairments:  Decreased balance, Decreased activity tolerance, Decreased coordination, Decreased knowledge of use of DME, Decreased safety awareness, Decreased strength, Impaired UE functional use, Decreased knowledge of precautions, Decreased cognition, Decreased endurance, Decreased mobility, Difficulty walking, Impaired vision/preception  Visit Diagnosis: Other lack of coordination  Muscle weakness (generalized)  Other abnormalities of gait and mobility  Visuospatial deficit  Other symptoms and signs involving cognitive functions following cerebral infarction  Attention and concentration deficit  Apraxia  Frontal lobe and executive function deficit  Unsteadiness on feet    Problem List Patient Active Problem List   Diagnosis Date Noted  . Hyperlipidemia   . Tobacco abuse 04/12/2017  . Dyslipidemia 04/12/2017  . PVD (peripheral vascular disease) (HCC) 04/12/2017  . Carotid arterial disease (HCC) 04/12/2017  . CVA (cerebral vascular accident) (HCC) 04/11/2017    [facility] Center[facility]REEMAN,ANGELA 05/26/2017, 11:07 AM  [facility] [address] [place], [place], [address] Phone: [phone]   Fax:  [phone]  Name: Daniel Stuart MRN: [id number] Date of Birth: Nov 01, 1966   Willa FraterAngela Freeman, OTR/L [facility] [address] [place], [place]  [address] [phone] 05/26/17 11:07 AM

## 2017-05-26 NOTE — Patient Instructions (Addendum)
  Coordination Activities  Perform the following activities for 20 minutes 1 times per day with both hand(s).   Rotate ball in fingertips (clockwise and counter-clockwise).  Toss ball between hands.  Toss ball in air and catch with the same hand.  Flip cards 1 at a time as fast as you can.  Deal cards with your thumb (Hold deck in hand and push card off top with thumb).  Rotate card in hand (clockwise and counter-clockwise).  Shuffle cards.  Pick up coins and stack.  Pick up coins one at a time until you get 5-10 in your hand, then move coins from palm to fingertips to place in container one at a time.  Practice writing and/or typing.       Strengthening: Resisted Flexion   Attach tube to door.  Hold tubing with one arm at side. Pull forward and up. Move shoulder through pain-free range of motion. Repeat 15 times per set.  Do 1-2 sessions per day.    Strengthening: Resisted Extension   Attach one end to door.  Hold tubing in one hand, arm forward. Pull arm back, elbow straight. Repeat 15 times per set. Do 1-2 sessions per day.   Resisted Horizontal Abduction: Bilateral   Sit or stand, tubing in both hands, arms out in front. Keeping arms straight, pinch shoulder blades together and stretch arms out. Repeat 15 times per set.  Do 1-2 sessions per day.    **REPEAT ALL WITH BOTH ARMS.

## 2017-05-27 ENCOUNTER — Encounter: Payer: Self-pay | Admitting: Physical Therapy

## 2017-05-27 ENCOUNTER — Ambulatory Visit: Payer: Medicaid Other | Admitting: Physical Therapy

## 2017-05-27 DIAGNOSIS — M6281 Muscle weakness (generalized): Secondary | ICD-10-CM | POA: Diagnosis not present

## 2017-05-27 DIAGNOSIS — R2681 Unsteadiness on feet: Secondary | ICD-10-CM

## 2017-05-27 DIAGNOSIS — R2689 Other abnormalities of gait and mobility: Secondary | ICD-10-CM

## 2017-05-27 NOTE — Therapy (Signed)
[facility] [address] [place], [place], [address] Phone: [phone]   Fax:  [phone]  Physical Therapy Treatment  Patient Details  Name: Daniel Stuart MRN: [id number] Date of Birth: 06-Oct-1966 Referring Provider: Joaquin CourtsKimberly Harris, [name]  Encounter Date: 05/27/2017      PT End of Session - 05/27/17 1753    Visit Number 2   Number of Visits 5   Date for PT Re-Evaluation 06/17/17   Authorization Type self pay   PT Start Time 0935   PT Stop Time 1018   PT Time Calculation (min) 43 min   Equipment Utilized During Treatment Gait belt   Activity Tolerance Patient tolerated treatment well   Behavior During Therapy [facility] for tasks assessed/performed      History reviewed. No pertinent past medical history.  Past Surgical History:  Procedure Laterality Date  . FOOT SURGERY    . LOOP RECORDER INSERTION N/A 04/13/2017   Procedure: Loop Recorder Insertion;  Surgeon: Marinus Mawaylor, Gregg W, MD;  [facility];  Service: Cardiovascular;  Laterality: N/A;  . TEE WITHOUT CARDIOVERSION N/A 04/13/2017   Procedure: TRANSESOPHAGEAL ECHOCARDIOGRAM (TEE);  Surgeon: Jake BatheSkains, Mark C, MD;  [facility];  Service: Cardiovascular;  Laterality: N/A;    There were no vitals filed for this visit.      Subjective Assessment - 05/27/17 0941    Subjective Has been doing coordination exercises at least 1x/day.    Pertinent History PVD; carotid aterial disease   Patient Stated Goals "get back like I was"                         [facility] Adult PT Treatment/Exercise - 05/27/17 0001      Transfers   Transfers Sit to Stand;Stand to Sit   Sit to Stand 6: Modified independent (Device/Increase time)   Comments incr time     Ambulation/Gait   Ambulation/Gait Assistance 5: Supervision   Ambulation/Gait Assistance Details initially with very short step length and very little flexion of knees during swing  phase; improved at end of session   Ambulation Distance (Feet) 100 Feet  100   Assistive device None   Gait Pattern Step-through pattern;Decreased arm swing - right;Decreased arm swing - left;Decreased stride length;Decreased hip/knee flexion - right;Decreased hip/knee flexion - left;Decreased trunk rotation   Ambulation Surface Level     Exercises   Exercises Knee/Hip     Knee/Hip Exercises: Supine   Bridges Strengthening;Both;2 sets;10 reps  10 second hold   Other Supine Knee/Hip Exercises attempted bridge with knee extension and could not maintain bridge;   Other Supine Knee/Hip Exercises coordination ex's with leg off side of bed and back up to a target     Knee/Hip Exercises: Sidelying   Clams green band 10 reps             Balance Exercises - 05/27/17 1751      Balance Exercises: Standing   Other Standing Exercises no UE support; bil LE touching targets with foot           PT Education - 05/27/17 1752    Education provided Yes   Education Details see HEP;    Person(s) Educated Patient   Methods Explanation;Demonstration;Verbal cues;Handout   Comprehension Verbalized understanding;Returned demonstration;Verbal cues required;Need further instruction             PT Long Term Goals - 05/17/17 2349      PT LONG TERM  GOAL #1   Title Independent in HEP for balance exercises.  06-16-17   Time 4   Period Weeks   Status New     PT LONG TERM GOAL #2   Title Improve TUG score to </= 13.0 secs without device for reduced fall risk.  06-16-17   Time 4   Period Weeks   Status New     PT LONG TERM GOAL #3   Title Increase gait velocity from 2.37 ft/sec to >/= 2.8 ft/sec for increased gait efficiency.  06-16-17   Time 4   Period Weeks   Status New     PT LONG TERM GOAL #4   Title Amb. 500' on flat, even/uneven surfaces with SBA without LOB.  06-16-17   Time 4   Period Weeks   Status New               Plan - 05/27/17 1753    Clinical Impression  Statement Session focused on coordination exercises, gait training, and educating in HEP. Patient very motivated and states he has been so bored at home since he is not working. Patient will continue to benefit from PT to decrease his fall risk and ultimately allow return to work force.    Rehab Potential Good   Clinical Impairments Affecting Rehab Potential ataxia, apraxia, coordination deficits   PT Frequency 1x / week  pt's request due to lack of insurance   PT Duration 4 weeks   PT Treatment/Interventions ADLs/Self Care Home Management;Gait training;Stair training;Therapeutic activities;Therapeutic exercise;Balance training;Neuromuscular re-education;Patient/family education   PT Next Visit Plan Pt is self-pay; briefly review HEP given 7/27; add balance ex's to HEP; gait training with no device   PT Home Exercise Plan see above   Consulted and Agree with Plan of Care Patient      Patient will benefit from skilled therapeutic intervention in order to improve the following deficits and impairments:  Abnormal gait, Decreased balance, Decreased coordination, Decreased strength, Decreased cognition  Visit Diagnosis: Other abnormalities of gait and mobility  Unsteadiness on feet     Problem List Patient Active Problem List   Diagnosis Date Noted  . Hyperlipidemia   . Tobacco abuse 04/12/2017  . Dyslipidemia 04/12/2017  . PVD (peripheral vascular disease) (HCC) 04/12/2017  . Carotid arterial disease (HCC) 04/12/2017  . CVA (cerebral vascular accident) (HCC) 04/11/2017    Zena AmosLynn P Star Cheese, PT 05/27/2017, 5:57 PM  [facility] [address] [place], [place], [address] Phone: [phone]   Fax:  [phone]  Name: Daniel Stuart MRN: [id number] Date of Birth: 02-24-1966

## 2017-05-27 NOTE — Patient Instructions (Addendum)
Bracing With Bridging (Hook-Lying)    Put band around your thighs, tighten abdominals, arms across your chest, lift bottom and press knees out gently against band. Count out loud to 10. Repeat __5_ times. Do 2 sets. Do __1_ times a day. Progress to doing 10 reps x 2 sets.    Copyright  VHI. All rights reserved.   Hip Flexor Stretch    Lying on back near edge of bed, bend one leg, and hang it over the edge, Leg on the bed lies flat on the bed (not bent like picture). Raise the leg from over the edge of the bed and touch heel/foot to knee (or outside of knee, inside of knee, top of ankle)--choose a target and try not to look but feel where your leg needs to go. Repeat __10__ times on each leg. Do _2___ sessions per day. Advanced Exercise: Bend knee back keeping thigh in contact with bed.  http://gt2.exer.us/346   Copyright  VHI. All rights reserved.   Standing with 3 plates on the floor at 12, 2, and 3 o'clock. Label the plates 1, 2, 3. Use one foot to touch each plate (one right after another) and back to start position. Then touch 1 and back to start (feet together) then 2 and back to start...  Do with each leg for 2 minutes. **Can also have someone call out a number and you have to move your foot as quickly and accurately as you can to the target.    WALKING  Walking is a great form of exercise to increase your strength, endurance and overall fitness.  A walking program can help you start slowly and gradually build endurance as you go.  Everyone's ability is different, so each person's starting point will be different.  You do not have to follow them exactly.  The are just samples. You should simply find out what's right for you and stick to that program.   In the beginning, you'll start off walking 2-3 times a day for short distances.  As you get stronger, you'll be walking further at just 1-2 times per day.  A. You Can Walk For A Certain Length Of Time Each Day    Example:  Walk  10 minutes 3 times per day.  Increase 2 minutes every 2 days   Work up to 25-30 minutes (1-2 times per day).    B. You Can Walk For a Certain Distance Each Day     Distance can be substituted for time.    Example:   3 trips to mailbox (at road)   3 trips to corner of block   3 trips around the block  C. Go to local [employer] and use the track.    Walk for distance ____ around track  Or time ____ minutes    Standing Hip Flexion    While standing on one leg, lift your other leg forward with a straight knee as shown.  Hold for 3 counts. Return to starting position and repeat 10 times for 1 sets.  1 sessions per day.   Use your arms for support if needed for balance and safety. (TRY not to use arms but have chair or counter close by)    Standing Hip Abduction    While standing, raise your leg out to the side. Keep your knee straight and maintain your toes pointed forward the entire time.  Hold for 3 counts. Return to starting position and repeat 10 times for 1 sets.  1 sessions per day.   Use your arms for support if needed for balance and safety. (TRY not to use arms but have chair or counter close by)  Standing Hip Extension    While standing, balance on one leg and move your other leg in a backward direction. Do not swing the leg. Perform smooth and controlled movements.   Keep your trunk stable and without arching during the movement.  Hold for 3 counts. Return to starting position and repeat 10 times for 1 sets.  1 sessions per day.   Use your arms for support if needed for balance and safety. (TRY not to use arms but have chair or counter close by)

## 2017-05-30 ENCOUNTER — Encounter: Payer: Self-pay | Admitting: Family Medicine

## 2017-05-30 ENCOUNTER — Ambulatory Visit (INDEPENDENT_AMBULATORY_CARE_PROVIDER_SITE_OTHER): Payer: Self-pay | Admitting: Family Medicine

## 2017-05-30 VITALS — BP 114/80 | HR 89 | Temp 98.2°F | Resp 14 | Ht 66.0 in | Wt 134.6 lb

## 2017-05-30 DIAGNOSIS — Z720 Tobacco use: Secondary | ICD-10-CM

## 2017-05-30 DIAGNOSIS — Z114 Encounter for screening for human immunodeficiency virus [HIV]: Secondary | ICD-10-CM

## 2017-05-30 DIAGNOSIS — I739 Peripheral vascular disease, unspecified: Secondary | ICD-10-CM

## 2017-05-30 DIAGNOSIS — I6310 Cerebral infarction due to embolism of unspecified precerebral artery: Secondary | ICD-10-CM

## 2017-05-30 MED ORDER — ATORVASTATIN CALCIUM 80 MG PO TABS: 80.0000 mg | ORAL_TABLET | Freq: Every day | ORAL | 1 refills | Status: DC

## 2017-05-30 MED ORDER — NICOTINE 21 MG/24HR TD PT24: 21.0000 mg | MEDICATED_PATCH | Freq: Every day | TRANSDERMAL | 1 refills | Status: DC

## 2017-05-30 MED ORDER — BUPROPION HCL ER (SR) 150 MG PO TB12: ORAL_TABLET | ORAL | 2 refills | Status: DC

## 2017-05-30 MED ORDER — ASPIRIN 81 MG PO TBEC: 81.0000 mg | DELAYED_RELEASE_TABLET | Freq: Every day | ORAL | 3 refills | Status: DC

## 2017-05-30 MED FILL — ATORVASTATIN 80 MG TABLET: 80 | 30 days supply | Qty: 30 | Fill #0

## 2017-05-30 MED FILL — BUPROPION SR 150 MG TABLET: 150 | 30 days supply | Qty: 60 | Fill #0

## 2017-05-30 NOTE — Patient Instructions (Addendum)
DASH Eating Plan DASH stands for "Dietary Approaches to Stop Hypertension." The DASH eating plan is a healthy eating plan that has been shown to reduce high blood pressure (hypertension). It may also reduce your risk for type 2 diabetes, heart disease, and stroke. The DASH eating plan may also help with weight loss. What are tips for following this plan? General guidelines  Avoid eating more than 2,300 mg (milligrams) of salt (sodium) a day. If you have hypertension, you may need to reduce your sodium intake to 1,500 mg a day.  Limit alcohol intake to no more than 1 drink a day for nonpregnant women and 2 drinks a day for men. One drink equals 12 oz of beer, 5 oz of wine, or 1 oz of hard liquor.  Work with your health care provider to maintain a healthy body weight or to lose weight. Ask what an ideal weight is for you.  Get at least 30 minutes of exercise that causes your heart to beat faster (aerobic exercise) most days of the week. Activities may include walking, swimming, or biking.  Work with your health care provider or diet and nutrition specialist (dietitian) to adjust your eating plan to your individual calorie needs. Reading food labels  Check food labels for the amount of sodium per serving. Choose foods with less than 5 percent of the Daily Value of sodium. Generally, foods with less than 300 mg of sodium per serving fit into this eating plan.  To find whole grains, look for the word "whole" as the first word in the ingredient list. Shopping  Buy products labeled as "low-sodium" or "no salt added."  Buy fresh foods. Avoid canned foods and premade or frozen meals. Cooking  Avoid adding salt when cooking. Use salt-free seasonings or herbs instead of table salt or sea salt. Check with your health care provider or pharmacist before using salt substitutes.  Do not fry foods. Cook foods using healthy methods such as baking, boiling, grilling, and broiling instead.  Cook with  heart-healthy oils, such as olive, canola, soybean, or sunflower oil. Meal planning   Eat a balanced diet that includes: ? 5 or more servings of fruits and vegetables each day. At each meal, try to fill half of your plate with fruits and vegetables. ? Up to 6-8 servings of whole grains each day. ? Less than 6 oz of lean meat, poultry, or fish each day. A 3-oz serving of meat is about the same size as a deck of cards. One egg equals 1 oz. ? 2 servings of low-fat dairy each day. ? A serving of nuts, seeds, or beans 5 times each week. ? Heart-healthy fats. Healthy fats called Omega-3 fatty acids are found in foods such as flaxseeds and coldwater fish, like sardines, salmon, and mackerel.  Limit how much you eat of the following: ? Canned or prepackaged foods. ? Food that is high in trans fat, such as fried foods. ? Food that is high in saturated fat, such as fatty meat. ? Sweets, desserts, sugary drinks, and other foods with added sugar. ? Full-fat dairy products.  Do not salt foods before eating.  Try to eat at least 2 vegetarian meals each week.  Eat more home-cooked food and less restaurant, buffet, and fast food.  When eating at a restaurant, ask that your food be prepared with less salt or no salt, if possible. What foods are recommended? The items listed may not be a complete list. Talk with your dietitian about what   dietary choices are best for you. Grains Whole-grain or whole-wheat bread. Whole-grain or whole-wheat pasta. Brown rice. Oatmeal. Quinoa. Bulgur. Whole-grain and low-sodium cereals. Pita bread. Low-fat, low-sodium crackers. Whole-wheat flour tortillas. Vegetables Fresh or frozen vegetables (raw, steamed, roasted, or grilled). Low-sodium or reduced-sodium tomato and vegetable juice. Low-sodium or reduced-sodium tomato sauce and tomato paste. Low-sodium or reduced-sodium canned vegetables. Fruits All fresh, dried, or frozen fruit. Canned fruit in natural juice (without  added sugar). Meat and other protein foods Skinless chicken or turkey. Ground chicken or turkey. Pork with fat trimmed off. Fish and seafood. Egg whites. Dried beans, peas, or lentils. Unsalted nuts, nut butters, and seeds. Unsalted canned beans. Lean cuts of beef with fat trimmed off. Low-sodium, lean deli meat. Dairy Low-fat (1%) or fat-free (skim) milk. Fat-free, low-fat, or reduced-fat cheeses. Nonfat, low-sodium ricotta or cottage cheese. Low-fat or nonfat yogurt. Low-fat, low-sodium cheese. Fats and oils Soft margarine without trans fats. Vegetable oil. Low-fat, reduced-fat, or light mayonnaise and salad dressings (reduced-sodium). Canola, safflower, olive, soybean, and sunflower oils. Avocado. Seasoning and other foods Herbs. Spices. Seasoning mixes without salt. Unsalted popcorn and pretzels. Fat-free sweets. What foods are not recommended? The items listed may not be a complete list. Talk with your dietitian about what dietary choices are best for you. Grains Baked goods made with fat, such as croissants, muffins, or some breads. Dry pasta or rice meal packs. Vegetables Creamed or fried vegetables. Vegetables in a cheese sauce. Regular canned vegetables (not low-sodium or reduced-sodium). Regular canned tomato sauce and paste (not low-sodium or reduced-sodium). Regular tomato and vegetable juice (not low-sodium or reduced-sodium). Pickles. Olives. Fruits Canned fruit in a light or heavy syrup. Fried fruit. Fruit in cream or butter sauce. Meat and other protein foods Fatty cuts of meat. Ribs. Fried meat. Bacon. Sausage. Bologna and other processed lunch meats. Salami. Fatback. Hotdogs. Bratwurst. Salted nuts and seeds. Canned beans with added salt. Canned or smoked fish. Whole eggs or egg yolks. Chicken or turkey with skin. Dairy Whole or 2% milk, cream, and half-and-half. Whole or full-fat cream cheese. Whole-fat or sweetened yogurt. Full-fat cheese. Nondairy creamers. Whipped toppings.  Processed cheese and cheese spreads. Fats and oils Butter. Stick margarine. Lard. Shortening. Ghee. Bacon fat. Tropical oils, such as coconut, palm kernel, or palm oil. Seasoning and other foods Salted popcorn and pretzels. Onion salt, garlic salt, seasoned salt, table salt, and sea salt. Worcestershire sauce. Tartar sauce. Barbecue sauce. Teriyaki sauce. Soy sauce, including reduced-sodium. Steak sauce. Canned and packaged gravies. Fish sauce. Oyster sauce. Cocktail sauce. Horseradish that you find on the shelf. Ketchup. Mustard. Meat flavorings and tenderizers. Bouillon cubes. Hot sauce and Tabasco sauce. Premade or packaged marinades. Premade or packaged taco seasonings. Relishes. Regular salad dressings. Where to find more information:  National Heart, Lung, and Blood Institute: www.nhlbi.nih.gov  American Heart Association: www.heart.org Summary  The DASH eating plan is a healthy eating plan that has been shown to reduce high blood pressure (hypertension). It may also reduce your risk for type 2 diabetes, heart disease, and stroke.  With the DASH eating plan, you should limit salt (sodium) intake to 2,300 mg a day. If you have hypertension, you may need to reduce your sodium intake to 1,500 mg a day.  When on the DASH eating plan, aim to eat more fresh fruits and vegetables, whole grains, lean proteins, low-fat dairy, and heart-healthy fats.  Work with your health care provider or diet and nutrition specialist (dietitian) to adjust your eating plan to your individual   calorie needs. This information is not intended to replace advice given to you by your health care provider. Make sure you discuss any questions you have with your health care provider. Document Released: 10/07/2011 Document Revised: 10/11/2016 Document Reviewed: 10/11/2016 Elsevier Interactive Patient Education  2017 Elsevier Inc.  

## 2017-05-30 NOTE — Progress Notes (Signed)
Patient ID: Daniel Stuart, male    DOB: 1966-07-22, 51 y.o.   MRN: [id number]  PCP: Bing NeighborsHarris, Amandalynn Pitz S, [name]  Chief Complaint  Patient presents with  . Follow-up    6 weeks on stroke    Subjective:  HPI Daniel Stuart is a 51 y.o. male presents for evaluation of 6 week follow-up of CVA. Daniel Stuart has recently began neurovascular rehabilitation. He reports that he continuous to experience some cognitive impairments. For examples, he occasionally forgets to take his medications, or while driving to appointments or family members homes, he has had to stop and call in order to receive driving directions recently. He denies any dizziness, headaches, and or weakness. Daniel Stuart was found to have a positive HIV antibody test 04/12/2017. He was retested here in office for HIV 1&2 which resulted on negative. Daniel Stuart understands that he will need to retest in September and January to confirm negative results. Daniel Stuart is not currently sexual active and reports no high risk behaviors that would have exposed him to acquire HIV. Social History   Social History  . Marital status: Legally Separated    Spouse name: N/A  . Number of children: N/A  . Years of education: N/A   Occupational History  . Not on file.   Social History Main Topics  . Smoking status: Current Every Day Smoker    Packs/day: 1.00    Types: Cigarettes  . Smokeless tobacco: Never Used  . Alcohol use No  . Drug use: No  . Sexual activity: Not on file   Other Topics Concern  . Not on file   Social History Narrative  . No narrative on file    Family History  Problem Relation Age of Onset  . Diabetes Mother   . Diabetes Father   . Heart failure Father    Review of Systems See HPI  Patient Active Problem List   Diagnosis Date Noted  . Hyperlipidemia   . Tobacco abuse 04/12/2017  . Dyslipidemia 04/12/2017  . PVD (peripheral vascular disease) (HCC) 04/12/2017  . Carotid arterial disease (HCC) 04/12/2017  . CVA  (cerebral vascular accident) (HCC) 04/11/2017    No Known Allergies  Prior to Admission medications   Medication Sig Start Date End Date Taking? Authorizing Provider  aspirin EC 81 MG EC tablet Take 1 tablet (81 mg total) by mouth daily. Patient not taking: Reported on 05/30/2017 04/14/17   Vassie LollMadera, Carlos, MD  atorvastatin (LIPITOR) 80 MG tablet Take 1 tablet (80 mg total) by mouth daily at 6 PM. Patient not taking: Reported on 05/30/2017 04/14/17   Vassie LollMadera, Carlos, MD  buPROPion [facility](WELLBUTRIN SR) 150 MG 12 hr tablet Day 1-3, take 150 mg once daily. Day 4, increase to 150 mg twice daily. Patient not taking: Reported on 05/30/2017 04/18/17   Bing NeighborsHarris, Pedrohenrique Mcconville S, [name]  nicotine (NICODERM CQ) 21 mg/24hr patch Place 1 patch (21 mg total) onto the skin daily. Patient not taking: Reported on 05/30/2017 04/13/17   Vassie LollMadera, Carlos, MD    Past Medical, Surgical Family and Social History reviewed and updated.    Objective:   Today's Vitals   05/30/17 0917  BP: 114/80  Pulse: 89  Resp: 14  Temp: 98.2 F (36.8 C)  TempSrc: Oral  SpO2: 97%  Weight: 134 lb 9.6 oz (61.1 kg)  Height: 5\' 6"  (1.676 m)    Wt Readings from Last 3 Encounters:  05/30/17 134 lb 9.6 oz (61.1 kg)  04/18/17 130 lb (59 kg)  04/11/17 130  lb (59 kg)    Physical Exam  Constitutional: He is oriented to person, place, and time. He appears well-developed and well-nourished.  HENT:  Head: Normocephalic and atraumatic.  Eyes: Pupils are equal, round, and reactive to light. Conjunctivae are normal.  Neck: Normal range of motion. Neck supple.  Cardiovascular: Normal rate, regular rhythm, normal heart sounds and intact distal pulses.   Pulmonary/Chest: Effort normal and breath sounds normal.  Musculoskeletal: Normal range of motion.  Neurological: He is alert and oriented to person, place, and time.  Skin: Skin is warm and dry.  Psychiatric: He has a normal mood and affect. His behavior is normal. Judgment and thought content  normal.    Assessment & Plan:  1. Cerebrovascular accident (CVA) due to embolism of precerebral artery (HCC) -Continue neurovascular rehabilitation  -Continue aspirin and Lipitor  2. PVD (peripheral vascular disease) (HCC) -Continue efforts toward smoking cessation -Continue aspirin and Lipitor  3. Tobacco abuse -Continue nicotine patches and bupropion 150 mg twice daily.  -Do not smoke while wearing patches.   RTC: 6 months for wellness visit and around September 12 for repeat labs  Godfrey PickKimberly S. Tiburcio PeaHarris, MSN, [name]-C [facility]  [address] [place], [place] [address] [phone]

## 2017-06-04 ENCOUNTER — Telehealth: Payer: Self-pay | Admitting: Family Medicine

## 2017-06-04 NOTE — Telephone Encounter (Signed)
Daniel Stuart,  Could you contact patient and schedule a lab visit only for any day during the week September 12. Visit will be for repeat HIV test. We discussed during his visit but I do not see that he scheduled the lab appt.

## 2017-06-06 ENCOUNTER — Ambulatory Visit: Payer: Medicaid Other | Attending: Internal Medicine | Admitting: Occupational Therapy

## 2017-06-06 ENCOUNTER — Ambulatory Visit: Payer: Medicaid Other | Admitting: Physical Therapy

## 2017-06-06 ENCOUNTER — Encounter: Payer: Self-pay | Admitting: Physical Therapy

## 2017-06-06 ENCOUNTER — Ambulatory Visit: Payer: Medicaid Other | Admitting: Speech Pathology

## 2017-06-06 DIAGNOSIS — R41841 Cognitive communication deficit: Secondary | ICD-10-CM

## 2017-06-06 DIAGNOSIS — I69318 Other symptoms and signs involving cognitive functions following cerebral infarction: Secondary | ICD-10-CM | POA: Diagnosis present

## 2017-06-06 DIAGNOSIS — R2689 Other abnormalities of gait and mobility: Secondary | ICD-10-CM | POA: Diagnosis present

## 2017-06-06 DIAGNOSIS — R278 Other lack of coordination: Secondary | ICD-10-CM | POA: Diagnosis present

## 2017-06-06 DIAGNOSIS — R4701 Aphasia: Secondary | ICD-10-CM

## 2017-06-06 DIAGNOSIS — M6281 Muscle weakness (generalized): Secondary | ICD-10-CM

## 2017-06-06 DIAGNOSIS — R41844 Frontal lobe and executive function deficit: Secondary | ICD-10-CM | POA: Diagnosis present

## 2017-06-06 DIAGNOSIS — R2681 Unsteadiness on feet: Secondary | ICD-10-CM | POA: Diagnosis present

## 2017-06-06 DIAGNOSIS — R482 Apraxia: Secondary | ICD-10-CM

## 2017-06-06 DIAGNOSIS — R41842 Visuospatial deficit: Secondary | ICD-10-CM

## 2017-06-06 DIAGNOSIS — R41840 Attention and concentration deficit: Secondary | ICD-10-CM

## 2017-06-06 NOTE — Patient Instructions (Addendum)
Perform these in the corner with a chair in front of you for safety:  Feet Together (Compliant Surface) Varied Arm Positions - Eyes Closed    Stand on compliant surface: _pillows_ with feet together and arms at sides. Close eyes and visualize upright position. Hold__30__ seconds. Repeat __2__ times per session. Do __1-2__ sessions per day.  Copyright  VHI. All rights reserved.    Feet Together (Compliant Surface) Head Motion - Eyes Closed    Stand on compliant surface: _pillows_ with feet together. Close eyes and move head slowly: 1. Up and down x 10 reps 2. Left and right x 10 reps 3. Diagonal head motions- look up to right/down to the left x 10, then up to the left/down to the right x 10 reps   Do __1-2__ sessions per day.  Copyright  VHI. All rights reserved.   Perform these at the counter top in you kitchen for balance assistance as needed:   "I love a Parade" Lift   At counter for balance as needed: high knee marching forward and then backward. 3 second pauses with each knee lift.  Repeat 3 laps each way. Do _1-2_ sessions per day. http://gt2.exer.us/345   Copyright  VHI. All rights reserved.  Walking on Toes   At counter for balance as needed: Walk on toes forward while continuing on a straight path, and then backwards on toes to starting position. Repeat 3 laps each way. Do _1-2___ sessions per day.  Copyright  VHI. All rights reserved.  Feet Heel-Toe "Tandem"   At counter: Arms at sides, walk a straight line forward bringing one foot directly in front of the other, and then a straight line backwards bringing one foot directly behind the other one.  Repeat for _3 laps each way. Do _1-2_ sessions per day.  Copyright  VHI. All rights reserved.

## 2017-06-06 NOTE — Patient Instructions (Addendum)

## 2017-06-06 NOTE — Therapy (Signed)
[facility] [address] [place], [place], [address] Phone: [phone]   Fax:  [phone]  Physical Therapy Treatment  Patient Details  Name: Daniel Earthlyimothy E Langi MRN: [id number] Date of Birth: [birth year] Referring Provider: Joaquin CourtsKimberly Harris, [name]  Encounter Date: 06/06/2017      PT End of Session - 06/06/17 1021    Visit Number 3   Number of Visits 5   Date for PT Re-Evaluation 06/17/17   Authorization Type self pay   PT Start Time 1018   PT Stop Time 1100   PT Time Calculation (min) 42 min   Equipment Utilized During Treatment Gait belt   Activity Tolerance Patient tolerated treatment well   Behavior During Therapy [facility] for tasks assessed/performed      History reviewed. No pertinent past medical history.  Past Surgical History:  Procedure Laterality Date  . FOOT SURGERY    . LOOP RECORDER INSERTION N/A 04/13/2017   Procedure: Loop Recorder Insertion;  Surgeon: Marinus Mawaylor, Gregg W, MD;  Location: [facility] INVASIVE CV LAB;  Service: Cardiovascular;  Laterality: N/A;  . TEE WITHOUT CARDIOVERSION N/A 04/13/2017   Procedure: TRANSESOPHAGEAL ECHOCARDIOGRAM (TEE);  Surgeon: Jake BatheSkains, Mark C, MD;  [facility];  Service: Cardiovascular;  Laterality: N/A;    There were no vitals filed for this visit.      Subjective Assessment - 06/06/17 1020    Subjective No new complaints. No falls or pain to report. Reports the HEP is going well, still challenging and has no questions about it.    Pertinent History PVD; carotid aterial disease   Patient Stated Goals "get back like I was"   Currently in Pain? No/denies     treatment: Added the following to pt's HEP: Cues with increased time needed for all exercises issued today.  Perform these in the corner with a chair in front of you for safety:  Feet Together (Compliant Surface) Varied Arm Positions - Eyes Closed    Stand on compliant surface: _pillows_ with feet together  and arms at sides. Close eyes and visualize upright position. Hold__30__ seconds. Repeat __2__ times per session. Do __1-2__ sessions per day.  Copyright  VHI. All rights reserved.    Feet Together (Compliant Surface) Head Motion - Eyes Closed    Stand on compliant surface: _pillows_ with feet together. Close eyes and move head slowly: 1. Up and down x 10 reps 2. Left and right x 10 reps 3. Diagonal head motions- look up to right/down to the left x 10, then up to the left/down to the right x 10 reps   Do __1-2__ sessions per day.  Copyright  VHI. All rights reserved.   Perform these at the counter top in you kitchen for balance assistance as needed:   "I love a Parade" Lift   At counter for balance as needed: high knee marching forward and then backward. 3 second pauses with each knee lift.  Repeat 3 laps each way. Do _1-2_ sessions per day. http://gt2.exer.us/345   Copyright  VHI. All rights reserved.  Walking on Toes   At counter for balance as needed: Walk on toes forward while continuing on a straight path, and then backwards on toes to starting position. Repeat 3 laps each way. Do _1-2___ sessions per day.  Copyright  VHI. All rights reserved.  Feet Heel-Toe "Tandem"   At counter: Arms at sides, walk a straight line forward bringing one foot directly in front of the other, and then a straight line  backwards bringing one foot directly behind the other one.  Repeat for _3 laps each way. Do _1-2_ sessions per day.  Copyright  VHI. All rights reserved.          PT Education - 06/06/17 1249    Education provided Yes   Education Details HEP for balance   Person(s) Educated Patient   Methods Explanation;Demonstration;Verbal cues   Comprehension Verbalized understanding;Returned demonstration;Verbal cues required;Other (comment)             PT Long Term Goals - 05/17/17 2349      PT LONG TERM GOAL #1   Title Independent in HEP for balance exercises.   06-16-17   Time 4   Period Weeks   Status New     PT LONG TERM GOAL #2   Title Improve TUG score to </= 13.0 secs without device for reduced fall risk.  06-16-17   Time 4   Period Weeks   Status New     PT LONG TERM GOAL #3   Title Increase gait velocity from 2.37 ft/sec to >/= 2.8 ft/sec for increased gait efficiency.  06-16-17   Time 4   Period Weeks   Status New     PT LONG TERM GOAL #4   Title Amb. 500' on flat, even/uneven surfaces with SBA without LOB.  06-16-17   Time 4   Period Weeks   Status New               Plan - 06/06/17 1021    Clinical Impression Statement Today's skilled session focused on added balance exercises to pt's HEP. Pt had difficulty with counter top activities initially, however less so as reps progressed. Pt should benefit from continued PT to progress toward unmet goals.    Rehab Potential Good   Clinical Impairments Affecting Rehab Potential ataxia, apraxia, coordination deficits   PT Frequency 1x / week  pt's request due to lack of insurance   PT Duration 4 weeks   PT Treatment/Interventions ADLs/Self Care Home Management;Gait training;Stair training;Therapeutic activities;Therapeutic exercise;Balance training;Neuromuscular re-education;Patient/family education   PT Next Visit Plan gait training with no device, continued to work on coordination, high level balance activities    PT Home Exercise Plan see above   Consulted and Agree with Plan of Care Patient      Patient will benefit from skilled therapeutic intervention in order to improve the following deficits and impairments:  Abnormal gait, Decreased balance, Decreased coordination, Decreased strength, Decreased cognition  Visit Diagnosis: Muscle weakness (generalized)  Other lack of coordination  Other abnormalities of gait and mobility     Problem List Patient Active Problem List   Diagnosis Date Noted  . Hyperlipidemia   . Tobacco abuse 04/12/2017  . Dyslipidemia  04/12/2017  . PVD (peripheral vascular disease) (HCC) 04/12/2017  . Carotid arterial disease (HCC) 04/12/2017  . CVA (cerebral vascular accident) (HCC) 04/11/2017    Sallyanne Kuster, PTA, [facility] [address], [address] [place], [place] [address] [phone] 06/06/17, 12:51 PM   Name: HERSHALL BENKERT MRN: [id number] Date of Birth: 1966/10/01

## 2017-06-06 NOTE — Therapy (Signed)
[facility] [address] [place], [place], [address] Phone: [phone]   Fax:  [phone]  Occupational Therapy Treatment  Patient Details  Name: Daniel Stuart MRN: [id number] Date of Birth: 1966/06/19 Referring Provider: Joaquin Courts, Stuart (PCP)  Encounter Date: 06/06/2017      OT End of Session - 06/06/17 0940    Visit Number 3   Number of Visits 9   Date for OT Re-Evaluation 07/16/17   Authorization Type self pay   OT Start Time [phone]   OT Stop Time 1017   OT Time Calculation (min) 39 min   Activity Tolerance Patient tolerated treatment well   Behavior During Therapy Daniel Stuart for tasks assessed/performed      No past medical history on file.  Past Surgical History:  Procedure Laterality Date  . FOOT SURGERY    . LOOP RECORDER INSERTION N/A 04/13/2017   Procedure: Loop Recorder Insertion;  Surgeon: Daniel Maw, MD;  [facility];  Service: Cardiovascular;  Laterality: N/A;  . TEE WITHOUT CARDIOVERSION N/A 04/13/2017   Procedure: TRANSESOPHAGEAL ECHOCARDIOGRAM (TEE);  Surgeon: Daniel Bathe, MD;  Location: [facility] ENDOSCOPY;  Service: Cardiovascular;  Laterality: N/A;    There were no vitals filed for this visit.      Subjective Assessment - 06/06/17 0940    Subjective  I forget basic things   Pertinent History bilateral CVA hosptialized 04/11/17; hx of multiple TIAs   Patient Stated Goals walking, memory   Currently in Pain? No/denies          Completing 12-piece puzzle with max difficulty and cueing for problem solving and visual perceptual skills.                    OT Education - 06/06/17 [phone]    Education Details Memory Compensation Stategies; Beginning Cognitive HEP--puzzles, word searches; Vocational Rehab Contact Info and services   Person(s) Educated Patient   Methods Demonstration;Explanation;Handout;Verbal cues   Comprehension Verbalized understanding           OT Short Term Goals - 05/17/17 0929      OT SHORT TERM GOAL #1   Title Pt will be independent with initial HEP for UE strength/coordination.--check STGs 06/16/17   Time 4   Period Weeks   Status New     OT SHORT TERM GOAL #2   Title Pt will verbalize understanding of visual and cognitive compensation strategies for ADLs.   Time 4   Period Weeks   Status New     OT SHORT TERM GOAL #3   Title Pt will be able to perfom home maintenance task for at least prior to rest break.   Baseline 10-15 min currently   Time 4   Period Weeks   Status New     OT SHORT TERM GOAL #4   Title Pt will improve coordination for ADLs as shown by improving time on 9-hole peg test by at least 10sec with each UE.   Baseline R-46, L-46.19sec   Time 4   Period Weeks   Status New           OT Long Term Goals - 05/17/17 0934      OT LONG TERM GOAL #1   Title Pt will be independent with updated HEP--check LTGs 07/16/17   Time 8   Period Weeks   Status New     OT LONG TERM GOAL #2   Title Pt will perform environmental  scanning with at least 90% accuracy in moderately busy environment for improved safety for community activiites.   Baseline 73% in minimal distracting environment   Time 8   Period Weeks   Status New     OT LONG TERM GOAL #3   Title Pt will perform simple-mod complex financial management tasks mod I.   Baseline 10-15 min currently   Time 8   Period Weeks   Status New     OT LONG TERM GOAL #4   Title Pt will be able to perfom home maintenance task for at least 30min prior to rest break.   Time 8   Period Weeks   Status New               Plan - 06/06/17 1003    Rehab Potential Good   Current Impairments/barriers affecting progress: pt financial concerns (self-pay)   OT Frequency 1x / week  recommended 2x/wk however, pt requests 1x/wk due to financial concerns   OT Duration 8 weeks  +eval   OT Treatment/Interventions Self-care/ADL  training;Cryotherapy;Parrafin;Therapeutic exercise;DME and/or AE instruction;Building services engineerunctional Mobility Training;Therapeutic activities;Patient/family education;Cognitive remediation/compensation;Balance training;Manual Therapy;Neuromuscular education;Fluidtherapy;Ultrasound;Moist Heat;Energy conservation;Passive range of motion;Therapeutic exercises;Visual/perceptual remediation/compensation   Plan visual scanning; Cognitive HEP; review coordination/theraband HEP   OT Home Exercise Plan Education provided:  Coordination and Yellow theraband HEP; Vocational Rehab Info; Memory Compensation Strategies   Consulted and Agree with Plan of Care Patient      Patient will benefit from skilled therapeutic intervention in order to improve the following deficits and impairments:  Decreased balance, Decreased activity tolerance, Decreased coordination, Decreased knowledge of use of DME, Decreased safety awareness, Decreased strength, Impaired UE functional use, Decreased knowledge of precautions, Decreased cognition, Decreased endurance, Decreased mobility, Difficulty walking, Impaired vision/preception  Visit Diagnosis: Other symptoms and signs involving cognitive functions following cerebral infarction  Visuospatial deficit  Apraxia  Attention and concentration deficit  Muscle weakness (generalized)  Other lack of coordination  Unsteadiness on feet    Problem List Patient Active Problem List   Diagnosis Date Noted  . Hyperlipidemia   . Tobacco abuse 04/12/2017  . Dyslipidemia 04/12/2017  . PVD (peripheral vascular disease) (HCC) 04/12/2017  . Carotid arterial disease (HCC) 04/12/2017  . CVA (cerebral vascular accident) [facility](HCC) 04/11/2017    [facility] SystemFREEMAN,[facility] Stuart 06/06/2017, 7:27 PM  [facility] [address] [place], [place], [address] Phone: [phone]   Fax:  [phone]  Name: Daniel Stuart MRN: [id number] Date of Birth: 02/14/1966    Willa FraterAngela Nayellie Stuart, OTR/L [facility] [address] [place], [place]  [address] [phone] phone [phone] 06/06/17 7:27 PM

## 2017-06-09 ENCOUNTER — Ambulatory Visit: Payer: Medicaid Other

## 2017-06-09 DIAGNOSIS — R4701 Aphasia: Secondary | ICD-10-CM

## 2017-06-09 DIAGNOSIS — R41841 Cognitive communication deficit: Secondary | ICD-10-CM

## 2017-06-09 DIAGNOSIS — I69318 Other symptoms and signs involving cognitive functions following cerebral infarction: Secondary | ICD-10-CM | POA: Diagnosis not present

## 2017-06-09 NOTE — Patient Instructions (Signed)
  Please complete the assigned speech therapy homework prior to your next session and return it to the speech therapist at your next visit. (clock hand drawing)

## 2017-06-09 NOTE — Therapy (Signed)
[facility] [address] [place], [place], [address] Phone: [phone]   Fax:  [phone]  Speech Language Pathology Evaluation  Patient Details  Name: Daniel Stuart MRN: [id number] Date of Birth: [birth year] Referring Provider: Bing Neighbors, [name]  Encounter Date: 06/06/2017    No past medical history on file.  Past Surgical History:  Procedure Laterality Date   FOOT SURGERY     LOOP RECORDER INSERTION N/A 04/13/2017   Procedure: Loop Recorder Insertion;  Surgeon: Marinus Maw, MD;  [facility] INV[facility] CV LAB;  Service: Cardiovascular;  Laterality: N/A;   TEE WITHOUT CARDIOVERSION N/A 04/13/2017   Procedure: TRANSESOPHAGEAL ECHOCARDIOGRAM (TEE);  Surgeon: Jake Bathe, MD;  Location: [facility] ENDOSCOPY;  Service: Cardiovascular;  Laterality: N/A;    There were no vitals filed for this visit.       06/06/17 1108  Symptoms/Limitations  Subjective "I don't think I have a problem with speech, it's just with thinking."  Pain Assessment  Currently in Pain? No/denies     06/06/17 1108  SLP Visit Information  SLP Received On 06/06/17  Referring Provider Bing Neighbors, [name]  Onset Date 04/11/17  Medical Diagnosis I69.328 (ICD-10-CM) - Speech or language deficit following cerebrovascular accident  Subjective  Subjective pleasant, cooperative  Pain Assessment  Currently in Pain? No/denies  General Information  HPI Pt is a 51 y/o male hospitalized 04/11/17 for bilateral CVA; MRI revealed multiple small areas of acute infarct in both cerebral hemispheres most likely due to emboli, chronic infarcts in frontal and parietal cortex, occipital and white matter bilaterally. Evaluated by ST during hospitalization; pt scored 24/30 on the [facility], presented with difficulties in working memory, complex problem solving, and sustained attention, slow processing speed.  Behavioral/Cognition alert, pleasant  Mobility  Status ambulated to session  Prior Functional Status  Cognitive/Linguistic Baseline WFL  Type of Home Apartment  Lives With Family (Mother)  Available Support Family  Education HS, some college  Vocation Other (comment) (applied for disability )  Cognition  Overall Cognitive Status Impaired/Different from baseline  Area of Impairment Memory;Safety/judgement;Problem solving;Attention;Awareness  Current Attention Level Sustained  Attention Comments slow processing speed  Memory Decreased short-term memory  Safety/Judgement Decreased awareness of safety;Decreased awareness of deficits  Awareness Intellectual;Emergent  Problem Solving Slow processing  Problem Solving Comments ran out of time for mazes, design generation, design memory and clock drawing tasks  Attention Sustained  Sustained Attention Impaired  Sustained Attention Impairment Verbal basic;Functional basic  Memory Impaired  Memory Impairment Storage deficit;Decreased recall of new information;Decreased short term memory  Decreased Short Term Memory Verbal basic;Functional basic  Awareness Impaired  Awareness Impairment Emergent impairment  Problem Solving Impaired  Problem Solving Impairment Verbal basic;Functional basic  Charity fundraiser;Initiating;Self Monitoring;Self Correcting  Sequencing Impaired  Sequencing Impairment Verbal basic;Functional basic  Organizing Impaired  Organizing Impairment Verbal basic;Functional basic  Decision Making Impaired  Decision Making Impairment Verbal basic;Functional basic  Initiating Impaired  Initiating Impairment Verbal basic;Functional basic  Self Monitoring Impaired  Self Monitoring Impairment Verbal basic;Functional basic  Self Correcting Impaired  Self Correcting Impairment Verbal basic;Functional basic  Auditory Comprehension  Overall Auditory Comprehension Appears within functional limits for tasks assessed  Visual  Recognition/Discrimination  Discrimination Not tested  Reading Comprehension  Reading Status Not tested  Expression  Primary Mode of Expression Verbal  Verbal Expression  Overall Verbal Expression Other (comment) (Needs further assessment)  Naming Impairment  Other Naming Comments Generative naming: 10 animals/60 sec, 3 m  words in 60 sec  Other Verbal Expression Comments CLQT verbal story retell 7/18, however Y/N 3/3   Written Expression  Dominant Hand Right  Written Expression Not tested  Motor Speech  Overall Motor Speech Appears within functional limits for tasks assessed  Standardized Assessments  Standardized Assessments  Cognitive Linguistic Quick Test  Cognitive Linguistic Quick Test (Ages 18-69)  Attention 2  Memory 1  Executive Function 1  Language 3  Visuospatial Skills 1  Severity Rating Total 8  Composite Severity Rating 7.2    06/06/17 1100  SLP Visits / Re-Eval  Visit Number 1  Number of Visits 17  Date for SLP Re-Evaluation 08/05/17  Authorization  Authorization Type self pay  SLP Time Calculation  SLP Start Time 1105  SLP Stop Time  1145  SLP Time Calculation (min) 40 min  SLP - End of Session  Activity Tolerance Patient tolerated treatment well     06/06/17 1145  SLP Assessment/Plan  Clinical Impression Statement Patient presents with moderate-severe cognitive communication impairment with primary deficits in attention, memory, and executive function. Patient has extremely delay processing speed, reduced task initiation; during CLQT administration he was unable to complete many tasks in the allotted time including clock drawing, design memory, and mazes). For design generation, pt generated only 2 complete designs (3 total attempts) in 3 minutes. Pt did demonstrate emergent awareness of errors/ deficits in several tasks, though he required max A for self-correction. Given bilateral infarcts and pts performance on generative naming, story retell tasks,  suspect he may have impairments in expressive language as well which will be further assessed in treatment sessions. Recommend skilled ST to address the above cognitive-linguistic impairments for improved independence, safety and quality of life.  Speech Therapy Frequency 2x / week  Duration (8 weeks or 16 additional visits)  Treatment/Interventions Language facilitation;Environmental controls;SLP instruction and feedback;Compensatory strategies;Functional tasks;Cognitive reorganization;Patient/family education;Multimodal communcation approach;Internal/external aids  Potential to Achieve Goals Good  Potential Considerations Financial resources  Consulted and Agree with Plan of Care Patient     06/06/17 1100  Education  Education provided Yes  PT Education  Education Details results of assessment, deficit areas  Person(s) Educated Patient  Methods Explanation;Verbal cues  Comprehension Verbalized understanding;Need further instruction           SLP Short Term Goals - 06/06/17 1145      SLP SHORT TERM GOAL #1   Title Pt will selectively attend to simple cognitive linguistic task in mildly distracting environment over 15 minutes with less than 3 redirections to task over 3 sessions.   Time 4   Period Weeks   Status New   Target Date 07/08/17     SLP SHORT TERM GOAL #2   Title Pt will name 8 items for simple categories with occasional min A.   Time 4   Period Weeks   Status New   Target Date 07/08/17     SLP SHORT TERM GOAL #3   Title Pt will solve simple reasoning, math, organization tasks with 80% accuracy and occasional min A   Time 4   Period Weeks   Status New   Target Date 07/08/17     SLP SHORT TERM GOAL #4   Title Pt will implement and use a system for functional recall of daily and scheduled activities with modified independence over 2 sessions   Time 4   Period Weeks   Status New   Target Date 07/08/17  SLP Long Term Goals - 06/06/17 1145       SLP LONG TERM GOAL #1   Title Pt will alternate attention between 2 simple cognitive linguistic tasks or conversation with 80% on each and occasional min A   Time 8   Period Weeks   Status New   Target Date 08/05/17     SLP LONG TERM GOAL #2   Title Pt will ID and correct errors on cognitive linguistic tasks 3/5 opportunities with occasional mod A.    Time 8   Period Weeks   Status New   Target Date 08/05/17     SLP LONG TERM GOAL #3   Title Pt will report daily functional and effective use of compensatory strategies for recall of daily activities and appointments.   Time 8   Period Weeks   Status New   Target Date 08/05/17        Patient will benefit from skilled therapeutic intervention in order to improve the following deficits and impairments:   Cognitive communication deficit  Aphasia    Problem List Patient Active Problem List   Diagnosis Date Noted   Hyperlipidemia    Tobacco abuse 04/12/2017   Dyslipidemia 04/12/2017   PVD (peripheral vascular disease) (HCC) 04/12/2017   Carotid arterial disease (HCC) 04/12/2017   CVA (cerebral vascular accident) [facility](HCC) 04/11/2017   Rondel BatonMary Beth Donzell Coller, MS, CCC-SLP Speech-Language Pathologist  Arlana LindauMary E Rashena Dowling 06/09/2017, 7:43 AM  [facility] [address] [place], [place], [address] Phone: [phone]   Fax:  [phone]  Name: Larina Earthlyimothy E Klemens MRN: [id number] Date of Birth: [birth year]

## 2017-06-09 NOTE — Therapy (Signed)
[facility] [address] [place], [place], [address] Phone: [phone]   Fax:  [phone]  Speech Language Pathology Treatment  Patient Details  Name: Daniel Stuart MRN: [id number] Date of Birth: 1966/01/07 Referring Provider: Bing NeighborsHarris, Kimberly S, [name]  Encounter Date: 06/09/2017      End of Session - 06/09/17 1204    Visit Number 2   Number of Visits 17   Date for SLP Re-Evaluation 08/05/17   Authorization Type self pay   SLP Start Time 1018   SLP Stop Time  1100   SLP Time Calculation (min) 42 min   Activity Tolerance Patient tolerated treatment well      No past medical history on file.  Past Surgical History:  Procedure Laterality Date  . FOOT SURGERY    . LOOP RECORDER INSERTION N/A 04/13/2017   Procedure: Loop Recorder Insertion;  Surgeon: Marinus Mawaylor, Gregg W, MD;  [facility] LAB;  Service: Cardiovascular;  Laterality: N/A;  . TEE WITHOUT CARDIOVERSION N/A 04/13/2017   Procedure: TRANSESOPHAGEAL ECHOCARDIOGRAM (TEE);  Surgeon: Jake BatheSkains, Mark C, MD;  [facility];  Service: Cardiovascular;  Laterality: N/A;    There were no vitals filed for this visit.      Subjective Assessment - 06/09/17 1016    Subjective Pt req'd extra time to follow simple auditory commands going back to ST room.   Currently in Pain? No/denies               ADULT SLP TREATMENT - 06/09/17 1018      General Information   Behavior/Cognition Alert;Cooperative;Pleasant mood     Treatment Provided   Treatment provided Cognitive-Linquistic     Cognitive-Linquistic Treatment   Treatment focused on Cognition   Skilled Treatment SLP facilitated pt's awareness by having him sort cards into suits and then in descending order. Pt chose to have aces count high. He separated suits 100% accuracy, in 3 mintues 45 seconds, then organized them high to low with simple to mod complex conversation with SLP, with slowed  processing time but 100% accuracy. SLP then showed pt his clock from eval - pt knew there were errors and stated, "I should have put a cross on to help with my numbers." SLP broke down the task step by step for pt and pt/SLP then completed the task together. When SLP had pt complete the task individutally, pt req'd cues due to size of hands and req'd 3 trials to decr hour hand length.      Assessment / Recommendations / Plan   Plan Continue with current plan of care     Progression Toward Goals   Progression toward goals Progressing toward goals            SLP Short Term Goals - 06/09/17 1212      SLP SHORT TERM GOAL #1   Title Pt will selectively attend to simple cognitive linguistic task in mildly distracting environment over 15 minutes with less than 3 redirections to task over 3 sessions.   Time 4   Period Weeks   Status On-going     SLP SHORT TERM GOAL #2   Title Pt will name 8 items for simple categories with occasional min A.   Time 4   Period Weeks   Status On-going     SLP SHORT TERM GOAL #3   Title Pt will solve simple reasoning, math, organization tasks with 80% accuracy and occasional min A   Time 4  Period Weeks   Status On-going     SLP SHORT TERM GOAL #4   Title Pt will implement and use a system for functional recall of daily and scheduled activities with modified independence over 2 sessions   Time 4   Period Weeks   Status On-going          SLP Long Term Goals - 06/09/17 1212      SLP LONG TERM GOAL #1   Title Pt will alternate attention between 2 simple cognitive linguistic tasks or conversation with 80% on each and occasional min A   Time 8   Period Weeks   Status On-going     SLP LONG TERM GOAL #2   Title Pt will ID and correct errors on cognitive linguistic tasks 3/5 opportunities with occasional mod A.    Time 8   Period Weeks   Status On-going     SLP LONG TERM GOAL #3   Title Pt will report daily functional and effective use of  compensatory strategies for recall of daily activities and appointments.   Time 8   Period Weeks   Status On-going          Plan - 06/09/17 1204    Clinical Impression Statement Patient presents with moderate-severe cognitive communication impairment with primary deficits in attention, memory, and executive function. Expressive language needs to be assessed in a subsequent session, minimal/no difficulty today talking about his job and symptoms of his CVA/s. Recommend cont'd skilled ST to address the above cognitive-linguistic impairments for improved independence, safety and quality of life.   Speech Therapy Frequency 2x / week   Duration --  8 weeks (or 16 visits)   Treatment/Interventions Language facilitation;Environmental controls;SLP instruction and feedback;Compensatory strategies;Functional tasks;Cognitive reorganization;Patient/family education;Multimodal communcation approach;Internal/external aids   Potential to Achieve Goals Good   Potential Considerations Financial resources   Consulted and Agree with Plan of Care Patient      Patient will benefit from skilled therapeutic intervention in order to improve the following deficits and impairments:   Cognitive communication deficit  Aphasia    Problem List Patient Active Problem List   Diagnosis Date Noted  . Hyperlipidemia   . Tobacco abuse 04/12/2017  . Dyslipidemia 04/12/2017  . PVD (peripheral vascular disease) (HCC) 04/12/2017  . Carotid arterial disease (HCC) 04/12/2017  . CVA (cerebral vascular accident) (HCC) 04/11/2017    [facility] ,MS, CCC-SLP  06/09/2017, 12:13 PM  [facility] [address] [place], [place], [address] Phone: [phone]   Fax:  [phone]   Name: Daniel Stuart MRN: [id number] Date of Birth: 1966-10-17

## 2017-06-13 ENCOUNTER — Ambulatory Visit: Payer: Medicaid Other | Admitting: Physical Therapy

## 2017-06-13 ENCOUNTER — Ambulatory Visit (INDEPENDENT_AMBULATORY_CARE_PROVIDER_SITE_OTHER): Payer: Self-pay | Admitting: *Deleted

## 2017-06-13 ENCOUNTER — Ambulatory Visit: Payer: Medicaid Other | Admitting: Occupational Therapy

## 2017-06-13 DIAGNOSIS — I69318 Other symptoms and signs involving cognitive functions following cerebral infarction: Secondary | ICD-10-CM | POA: Diagnosis not present

## 2017-06-13 DIAGNOSIS — M6281 Muscle weakness (generalized): Secondary | ICD-10-CM

## 2017-06-13 DIAGNOSIS — R278 Other lack of coordination: Secondary | ICD-10-CM

## 2017-06-13 DIAGNOSIS — R482 Apraxia: Secondary | ICD-10-CM

## 2017-06-13 DIAGNOSIS — R41842 Visuospatial deficit: Secondary | ICD-10-CM

## 2017-06-13 DIAGNOSIS — R41840 Attention and concentration deficit: Secondary | ICD-10-CM

## 2017-06-13 DIAGNOSIS — R41844 Frontal lobe and executive function deficit: Secondary | ICD-10-CM

## 2017-06-13 DIAGNOSIS — I6310 Cerebral infarction due to embolism of unspecified precerebral artery: Secondary | ICD-10-CM

## 2017-06-13 DIAGNOSIS — R2681 Unsteadiness on feet: Secondary | ICD-10-CM

## 2017-06-13 DIAGNOSIS — R2689 Other abnormalities of gait and mobility: Secondary | ICD-10-CM

## 2017-06-13 NOTE — Patient Instructions (Addendum)
Activities for thinking skills and vision:   1. Jigsaw puzzles 2. Lumosity.com or constanttherapy.com 3. Word serches 4. With someone with you, look for items in grocery store 5.  Play simple matching games online/tablet that make you search visually. 6.  Play board/card games (memory/matching, solitaire, connect 4, etc.)   Compensation strategies:  1. Look for the edge of objects (to the left and/or right) so that you make sure you are seeing all of an object 2. Turn your head when walking, scan from side to side, particularly in busy environments 3. Use an organized scanning pattern. It's usually easier to scan from top to bottom, and left to right (like you are reading) 4. Double check yourself 5. Use a line guide (like a blank piece of paper) or your finger when reading

## 2017-06-13 NOTE — Therapy (Signed)
[facility] [address] [place], [place], [address] Phone: [phone]   Fax:  [phone]  Occupational Therapy Treatment  Patient Details  Name: Daniel Stuart MRN: [id number] Date of Birth: 04-13-1966 Referring Provider: Joaquin CourtsKimberly Harris, [name] (PCP)  Encounter Date: 06/13/2017      OT End of Session - 06/13/17 0941    Visit Number 4   Number of Visits 9   Date for OT Re-Evaluation 07/16/17   Authorization Type self pay   OT Start Time 0937   OT Stop Time 1018   OT Time Calculation (min) 41 min   Activity Tolerance Patient tolerated treatment well   Behavior [facility] for tasks assessed/performed      No past medical history on file.  Past Surgical History:  Procedure Laterality Date  . FOOT SURGERY    . LOOP RECORDER INSERTION N/A 04/13/2017   Procedure: Loop Recorder Insertion;  Surgeon: Marinus Mawaylor, Gregg W, MD;  [facility];  Service: Cardiovascular;  Laterality: N/A;  . TEE WITHOUT CARDIOVERSION N/A 04/13/2017   Procedure: TRANSESOPHAGEAL ECHOCARDIOGRAM (TEE);  Surgeon: Jake BatheSkains, Mark C, MD;  [facility];  Service: Cardiovascular;  Laterality: N/A;    There were no vitals filed for this visit.      Subjective Assessment - 06/13/17 0937    Subjective  Pt reports that he is driving   Pertinent History bilateral CVA hosptialized 04/11/17; hx of multiple TIAs   Patient Stated Goals walking, memory   Currently in Pain? No/denies      Copying flower designs for visual-perceptual skills with min-mod cueing at times for orientation and problem solving, significant incr time.    Strongly reviewed recommendation not to drive due to cognitive and visual-perceptual deficits and slower reaction time/processing.  Reviewed recommendation to contact [facility].    Visual scanning/number cancellation with incr time and disorganized scanning pattern (approx 50% accuracy within  approx 8min)                        OT Education - 06/13/17 1011    Education Details HEP for cognition/vision and Visual compensation Strategies--see pt instructions   Person(s) Educated Patient   Methods Explanation;Handout   Comprehension Verbalized understanding          OT Short Term Goals - 05/17/17 0929      OT SHORT TERM GOAL #1   Title Pt will be independent with initial HEP for UE strength/coordination.--check STGs 06/16/17   Time 4   Period Weeks   Status New     OT SHORT TERM GOAL #2   Title Pt will verbalize understanding of visual and cognitive compensation strategies for ADLs.   Time 4   Period Weeks   Status New     OT SHORT TERM GOAL #3   Title Pt will be able to perfom home maintenance task for at least 20min prior to rest break.   Baseline 10-15 min currently   Time 4   Period Weeks   Status New     OT SHORT TERM GOAL #4   Title Pt will improve coordination for ADLs as shown by improving time on 9-hole peg test by at least 10sec with each UE.   Baseline R-46, L-46.19sec   Time 4   Period Weeks   Status New           OT Long Term Goals - 05/17/17 [id number]  OT LONG TERM GOAL #1   Title Pt will be independent with updated HEP--check LTGs 07/16/17   Time 8   Period Weeks   Status New     OT LONG TERM GOAL #2   Title Pt will perform environmental scanning with at least 90% accuracy in moderately busy environment for improved safety for community activiites.   Baseline 73% in minimal distracting environment   Time 8   Period Weeks   Status New     OT LONG TERM GOAL #3   Title Pt will perform simple-mod complex financial management tasks mod I.   Baseline 10-15 min currently   Time 8   Period Weeks   Status New     OT LONG TERM GOAL #4   Title Pt will be able to perfom home maintenance task for at least prior to rest break.   Time 8   Period Weeks   Status New               Plan - 06/13/17 1009     Clinical Impression Statement Pt continues to demo difficulty with visual scanning and visual perceptual skills.  Pt continues to drive despite strongly advising against driving.   Rehab Potential Good   Current Impairments/barriers affecting progress: pt financial concerns (self-pay)   OT Frequency 1x / week  recommended 2x/wk however, pt requests 1x/wk due to financial concerns   OT Duration 8 weeks  +eval   OT Treatment/Interventions Self-care/ADL training;Cryotherapy;Parrafin;Therapeutic exercise;DME and/or AE instruction;Building services engineer;Therapeutic activities;Patient/family education;Cognitive remediation/compensation;Balance training;Manual Therapy;Neuromuscular education;Fluidtherapy;Ultrasound;Moist Heat;Energy conservation;Passive range of motion;Therapeutic exercises;Visual/perceptual remediation/compensation   Plan continue to address cognition and visual-perceptual skills   OT Home Exercise Plan Education provided:  Coordination and Yellow theraband HEP; Vocational Rehab Info; Memory Compensation Strategies   Consulted and Agree with Plan of Care Patient      Patient will benefit from skilled therapeutic intervention in order to improve the following deficits and impairments:  Decreased balance, Decreased activity tolerance, Decreased coordination, Decreased knowledge of use of DME, Decreased safety awareness, Decreased strength, Impaired UE functional use, Decreased knowledge of precautions, Decreased cognition, Decreased endurance, Decreased mobility, Difficulty walking, Impaired vision/preception  Visit Diagnosis: Other symptoms and signs involving cognitive functions following cerebral infarction  Visuospatial deficit  Attention and concentration deficit  Apraxia  Other lack of coordination  Frontal lobe and executive function deficit    Problem List Patient Active Problem List   Diagnosis Date Noted  . Hyperlipidemia   . Tobacco abuse 04/12/2017  .  Dyslipidemia 04/12/2017  . PVD (peripheral vascular disease) (HCC) 04/12/2017  . Carotid arterial disease (HCC) 04/12/2017  . CVA (cerebral vascular accident) (HCC) 04/11/2017    [facility] 06/13/2017, 1:10 PM  [facility] [address] [place], [place], [address] Phone: [phone]   Fax:  [phone]  Name: Daniel Stuart MRN: [id number] Date of Birth: [birth year]   Willa Frater, OTR/L [facility] [address] [place], [place]  [address] [phone] phone [phone] 06/13/17 1:10 PM

## 2017-06-14 NOTE — Therapy (Signed)
[facility] [address] [place], [place], [address] Phone: [phone]   Fax:  [phone]  Physical Therapy Treatment  Patient Details  Name: Daniel Earthlyimothy E Arave MRN: [id number] Date of Birth: Mar 22, 1966 Referring Provider: Joaquin CourtsKimberly Harris, [name]  Encounter Date: 06/13/2017      PT End of Session - 06/14/17 2101    Visit Number 4   Number of Visits 5   Date for PT Re-Evaluation 06/17/17   Authorization Type self pay   PT Start Time 1017   PT Stop Time 1100   PT Time Calculation (min) 43 min      No past medical history on file.  Past Surgical History:  Procedure Laterality Date  . FOOT SURGERY    . LOOP RECORDER INSERTION N/A 04/13/2017   Procedure: Loop Recorder Insertion;  Surgeon: Marinus Mawaylor, Gregg W, MD;  [facility];  Service: Cardiovascular;  Laterality: N/A;  . TEE WITHOUT CARDIOVERSION N/A 04/13/2017   Procedure: TRANSESOPHAGEAL ECHOCARDIOGRAM (TEE);  Surgeon: Jake BatheSkains, Mark C, MD;  [facility];  Service: Cardiovascular;  Laterality: N/A;    There were no vitals filed for this visit.      Subjective Assessment - 06/14/17 2057    Subjective Pt states he is doing a little better, says "it's slow" but Ok   Pertinent History PVD; carotid aterial disease   Patient Stated Goals "get back like I was"   Currently in Pain? No/denies                         [facility]/Exercise - 06/14/17 0001      Ambulation/Gait   Ambulation/Gait Yes   Ambulation/Gait Assistance 6: Modified independent (Device/Increase time)   Ambulation Distance (Feet) 115 Feet   Assistive device None   Gait Pattern Ataxic;Decreased step length - left;Decreased step length - right   Ambulation Surface Level;Indoor     High Level Balance   High Level Balance Activities Side stepping;Braiding;Backward walking;Marching forwards;Tandem walking;Negotiating over obstacles     Knee/Hip  Exercises: Stretches   Active Hamstring Stretch Both;1 rep;30 seconds      TherAct:  Jumping, single limb hopping attempted in // bars with bil. UE support; pt has difficulty clearing floor due to Rt plantarflexor weakness Jogging attempted but pt unable to perform   Bil. Heel raises x 10 reps with UE support   Above balance exercises performed inside // bars with UE support prn            PT Long Term Goals - 05/17/17 2349      PT LONG TERM GOAL #1   Title Independent in HEP for balance exercises.  06-16-17   Time 4   Period Weeks   Status New     PT LONG TERM GOAL #2   Title Improve TUG score to </= 13.0 secs without device for reduced fall risk.  06-16-17   Time 4   Period Weeks   Status New     PT LONG TERM GOAL #3   Title Increase gait velocity from 2.37 ft/sec to >/= 2.8 ft/sec for increased gait efficiency.  06-16-17   Time 4   Period Weeks   Status New     PT LONG TERM GOAL #4   Title Amb. 500' on flat, even/uneven surfaces with SBA without LOB.  06-16-17   Time 4   Period Weeks   Status New  Plan - 06/14/17 2101    Clinical Impression Statement Pt has decreased strength in Rt plantarflexors > than Lt plantarflexors resulting in difficulty jumping; pt unable to jog due to decreased strength and decreased coordination in bil. LE's.  Pt has decreased motor planning with reports of diffculty initiating desired movement in legs.   Rehab Potential Good   PT Frequency 1x / week   PT Duration 4 weeks   PT Treatment/Interventions ADLs/Self Care Home Management;Gait training;Stair training;Therapeutic activities;Therapeutic exercise;Balance training;Neuromuscular re-education;Patient/family education   PT Next Visit Plan gait training with no device, continued to work on coordination, high level balance activities    Consulted and Agree with Plan of Care Patient      Patient will benefit from skilled therapeutic intervention in order to  improve the following deficits and impairments:  Abnormal gait, Decreased balance, Decreased coordination, Decreased strength, Decreased cognition  Visit Diagnosis: Other abnormalities of gait and mobility  Unsteadiness on feet  Muscle weakness (generalized)     Problem List Patient Active Problem List   Diagnosis Date Noted  . Hyperlipidemia   . Tobacco abuse 04/12/2017  . Dyslipidemia 04/12/2017  . PVD (peripheral vascular disease) (HCC) 04/12/2017  . Carotid arterial disease (HCC) 04/12/2017  . CVA (cerebral vascular accident) (HCC) 04/11/2017    Daniel Stuart, Daniel Stuart, PT 06/14/2017, 9:07 PM  [facility] [address] [place], [place], [address] Phone: [phone]   Fax:  [phone]  Name: Daniel Stuart MRN: [id number] Date of Birth: February 07, 1966

## 2017-06-17 ENCOUNTER — Ambulatory Visit: Payer: Medicaid Other

## 2017-06-17 DIAGNOSIS — R41841 Cognitive communication deficit: Secondary | ICD-10-CM

## 2017-06-17 DIAGNOSIS — R4701 Aphasia: Secondary | ICD-10-CM

## 2017-06-17 DIAGNOSIS — I69318 Other symptoms and signs involving cognitive functions following cerebral infarction: Secondary | ICD-10-CM | POA: Diagnosis not present

## 2017-06-17 NOTE — Therapy (Signed)
[facility] [address] [place], [place], [address] Phone: [phone]   Fax:  [phone]  Speech Language Pathology Treatment  Patient Details  Name: ENDRE FRESCH MRN: [id number] Date of Birth: November 24, 1965 Referring Provider: Bing Neighbors, [name]  Encounter Date: 06/17/2017      End of Session - 06/17/17 1530    Visit Number 3   Number of Visits 17   Date for SLP Re-Evaluation 08/05/17   Authorization Type self pay   SLP Start Time 1105   SLP Stop Time  1145   SLP Time Calculation (min) 40 min   Activity Tolerance Patient tolerated treatment well      No past medical history on file.  Past Surgical History:  Procedure Laterality Date  . FOOT SURGERY    . LOOP RECORDER INSERTION N/A 04/13/2017   Procedure: Loop Recorder Insertion;  Surgeon: Marinus Maw, MD;  [facility];  Service: Cardiovascular;  Laterality: N/A;  . TEE WITHOUT CARDIOVERSION N/A 04/13/2017   Procedure: TRANSESOPHAGEAL ECHOCARDIOGRAM (TEE);  Surgeon: Jake Bathe, MD;  Location: [facility] ENDOSCOPY;  Service: Cardiovascular;  Laterality: N/A;    There were no vitals filed for this visit.      Subjective Assessment - 06/17/17 1528    Subjective Pt enters with homework.   Currently in Pain? No/denies               ADULT SLP TREATMENT - 06/17/17 1151      General Information   Behavior/Cognition Alert;Cooperative;Pleasant mood     Treatment Provided   Treatment provided Cognitive-Linquistic     Cognitive-Linquistic Treatment   Treatment focused on Cognition   Skilled Treatment SLP focused on pt awareness of errors as well as awareness of risk factors for CVA. Pt arrived smelling of cigarette smoke. SLP reviewed risk factors for CVA and SLP reviewed pt's med list with him as he was not aware of what Lipitor was for. Pt agreed to decr to x1/week due to selfpay status. Pt's homework was reviewed for errors,  which pt was without awarenes. SLP faciliated work with pt on tips for drawing hands for correct time.      Assessment / Recommendations / Plan   Plan --  reduce to once/week     Progression Toward Goals   Progression toward goals Progressing toward goals          SLP Education - 06/17/17 1530    Education provided Yes   Education Details errors on homework   Person(s) Educated Patient   Methods Explanation;Handout   Comprehension Need further instruction;Verbalized understanding          SLP Short Term Goals - 06/17/17 1531      SLP SHORT TERM GOAL #1   Title Pt will selectively attend to simple cognitive linguistic task in mildly distracting environment over 15 minutes with less than 3 redirections to task over 3 sessions.   Time 3   Period Weeks   Status On-going     SLP SHORT TERM GOAL #2   Title Pt will name 8 items for simple categories with occasional min A.   Time 3   Period Weeks   Status On-going     SLP SHORT TERM GOAL #3   Title Pt will solve simple reasoning, math, organization tasks with 80% accuracy and occasional min A   Time 3   Period Weeks   Status On-going     SLP SHORT TERM  GOAL #4   Title Pt will implement and use a system for functional recall of daily and scheduled activities with modified independence over 2 sessions   Time 3   Period Weeks   Status On-going          SLP Long Term Goals - 06/17/17 1531      SLP LONG TERM GOAL #1   Title Pt will alternate attention between 2 simple cognitive linguistic tasks or conversation with 80% on each and occasional min A   Time 7   Period Weeks   Status On-going     SLP LONG TERM GOAL #2   Title Pt will ID and correct errors on cognitive linguistic tasks 3/5 opportunities with occasional mod A.    Time 7   Period Weeks   Status On-going     SLP LONG TERM GOAL #3   Title Pt will report daily functional and effective use of compensatory strategies for recall of daily activities and  appointments.   Time 7   Period Weeks   Status On-going     Plan is for pt to be seen x1/week for focus on cognitive function, and also for expressive aphasia PRN. Assessment to be completed for word finding, however SLP did not note any expressive language difficulties in ST session today.   Patient will benefit from skilled therapeutic intervention in order to improve the following deficits and impairments:   Cognitive communication deficit  Aphasia    Problem List Patient Active Problem List   Diagnosis Date Noted  . Hyperlipidemia   . Tobacco abuse 04/12/2017  . Dyslipidemia 04/12/2017  . PVD (peripheral vascular disease) (HCC) 04/12/2017  . Carotid arterial disease (HCC) 04/12/2017  . CVA (cerebral vascular accident) (HCC) 04/11/2017    [facility] ,MS, CCC-SLP  06/17/2017, 3:34 PM  [facility] [address] [place], [place], [address] Phone: [phone]   Fax:  [phone]   Name: LANNY LIPKIN MRN: [id number] Date of Birth: 26-Mar-1966

## 2017-06-17 NOTE — Patient Instructions (Signed)
  Please complete the assigned speech therapy homework prior to your next session and return it to the speech therapist at your next visit.  

## 2017-06-18 LAB — CUP PACEART REMOTE DEVICE CHECK
Implantable Pulse Generator Implant Date: 20180613
MDC IDC SESS DTM: 20180813001029

## 2017-06-18 NOTE — Progress Notes (Signed)
Carelink summary report received. Battery status OK. Normal device function. No new symptom episodes, tachy episodes, brady, or pause episodes. No new AF episodes. Monthly summary reports and ROV/PRN 

## 2017-06-20 ENCOUNTER — Ambulatory Visit: Payer: Medicaid Other | Admitting: Physical Therapy

## 2017-06-20 ENCOUNTER — Ambulatory Visit: Payer: Medicaid Other | Admitting: Occupational Therapy

## 2017-06-20 DIAGNOSIS — R41842 Visuospatial deficit: Secondary | ICD-10-CM

## 2017-06-20 DIAGNOSIS — I69318 Other symptoms and signs involving cognitive functions following cerebral infarction: Secondary | ICD-10-CM

## 2017-06-20 DIAGNOSIS — R278 Other lack of coordination: Secondary | ICD-10-CM

## 2017-06-20 DIAGNOSIS — R2689 Other abnormalities of gait and mobility: Secondary | ICD-10-CM

## 2017-06-20 DIAGNOSIS — R2681 Unsteadiness on feet: Secondary | ICD-10-CM

## 2017-06-20 DIAGNOSIS — R41840 Attention and concentration deficit: Secondary | ICD-10-CM

## 2017-06-20 DIAGNOSIS — R482 Apraxia: Secondary | ICD-10-CM

## 2017-06-20 NOTE — Patient Instructions (Signed)
Standing Marching   Using a chair if necessary, march in place. Repeat 10 times. Do 1 sessions per day.  http://gt2.exer.us/344   Copyright  VHI. All rights reserved.   Hip Backward Kick   Using a chair for balance, keep legs shoulder width apart and toes pointed for- ward. Slowly extend one leg back, keeping knee straight. Do not lean forward. Repeat with other leg. Repeat 10 times. Do 1sessions per day.  ALTERNATE LEGS  http://gt2.exer.us/340   Copyright  VHI. All rights reserved.     Hip Side Kick   Holding a chair for balance, keep legs shoulder width apart and toes pointed forward. Swing a leg out to side, keeping knee straight. Do not lean. Repeat using other leg. Repeat 10 times. Do 1 sessions per day.  ALTERNATE LEGS  http://gt2.exer.us/342   Copyright  VHI. All rights reserved.   Feet Heel-Toe "Tandem", Varied Arm Positions - Eyes Open   With eyes open, right foot directly in front of the other, arms out, look straight ahead at a stationary object. Hold 10 seconds. Repeat 1 times per session. Do  1 sessions per day.  (TRY)  Copyright  VHI. All rights reserved.       Standing On One Leg Without Support .  Stand on one leg in neutral spine without support. Hold 10 seconds. Repeat on other leg. Do 3 repetitions, 1-2 sets. HOLD 10 SECS  http://bt.exer.us/36   Copyright  VHI. All rights reserved.    Side-Stepping   Walk to left side with eyes open. Take even steps, leading with same foot. Make sure each foot lifts off the floor. Repeat in opposite direction. Repeat for 1 minutes per session. Do 1 sessions per day.   Copyright  VHI. All rights reserved.          Braiding   Move to side: 1) cross right leg in front of left, 2) bring back leg out to side, then 3) cross right leg behind left, 4) bring left leg out to side. Continue sequence in same direction. Reverse sequence, moving in opposite direction. Repeat sequence 2 times per  session. Do 1 sessions per day.   Copyright  VHI. All rights reserved.

## 2017-06-20 NOTE — Therapy (Signed)
[facility] [address] [place], [place], [address] Phone: [phone]   Fax:  [phone]  Occupational Therapy Treatment  Patient Details  Name: Daniel Stuart MRN: [id number] Date of Birth: Feb 26, 1966 Referring Provider: Joaquin Courts, [name] (PCP)  Encounter Date: 06/20/2017      OT End of Session - 06/20/17 0936    Visit Number 5   Number of Visits 9   Date for OT Re-Evaluation 07/16/17   Authorization Type self pay   OT Start Time 0933   OT Stop Time 1018   OT Time Calculation (min) 45 min   Activity Tolerance Patient tolerated treatment well   Behavior During Therapy [facility] for tasks assessed/performed      No past medical history on file.  Past Surgical History:  Procedure Laterality Date  . FOOT SURGERY    . LOOP RECORDER INSERTION N/A 04/13/2017   Procedure: Loop Recorder Insertion;  Surgeon: Marinus Maw, MD;  Location: [facility] INVASIVE CV LAB;  Service: Cardiovascular;  Laterality: N/A;  . TEE WITHOUT CARDIOVERSION N/A 04/13/2017   Procedure: TRANSESOPHAGEAL ECHOCARDIOGRAM (TEE);  Surgeon: Jake Bathe, MD;  Location: [facility] ENDOSCOPY;  Service: Cardiovascular;  Laterality: N/A;    There were no vitals filed for this visit.      Subjective Assessment - 06/20/17 0935    Subjective  Pt reports that he has not contacted Vocational Rehab yet    Pertinent History bilateral CVA hosptialized 04/11/17; hx of multiple TIAs   Patient Stated Goals walking, memory   Currently in Pain? No/denies       Pt given MVPT-R (Motor-free Visual Perception Test-Revised) with 9/13 items correct for Visual discrimination section (69.2% accuracy), 6/8 correct for Visual Memory section (75% accuracy), 10/13 items correct for Visual closure section (76.9% accuracy), and 4/5 items correct for spatial relationships section (80% accuracy).  Pt needed incr time for all items in all sections for processing.  Discussed results of  testing and how these deficits impact functional tasks/ADLs (including driving, work-tasks, ADLs).   Pt verbalized understanding and reports difficulties in these functional tasks after education provided.  Reviewed recommendations to work on Runner, broadcasting/film/video at home and issued worksheets for dot-to-dot, mazes, hidden pictures, word searches, alphabetizing.  Also recommended jigsaw puzzles, matching games with cards, memory matching, and locating items in grocery store.  Encouraged pt to pursue Vocational Rehab with contact info provided in previous session.  Discussed holding OT x2-3 weeks to allow time for financial aide to be approved--pt verbalized desire to hold x2 weeks and contact OT if anything changes.                             OT Education - 06/20/17 1624    Education Details Results of MVPT-R and how these visual-perceptual deficits may affect functional tasks and driving; Reviewed and issued activities for home (dot-to-dots, mazes, word searches, hidden pictures, alphabetizing words, etc) and recommended matching and memory matching games as well as looking for items in grocery store and jigsaw puzzles.     Person(s) Educated Patient   Methods Handout;Explanation   Comprehension Verbalized understanding          OT Short Term Goals - 06/20/17 1633      OT SHORT TERM GOAL #1   Title Pt will be independent with initial HEP for UE strength/coordination.--check STGs 06/16/17   Time 4   Period Weeks   Status On-going  OT SHORT TERM GOAL #2   Title Pt will verbalize understanding of visual and cognitive compensation strategies for ADLs.   Time 4   Period Weeks   Status On-going     OT SHORT TERM GOAL #3   Title Pt will be able to perfom home maintenance task for at least prior to rest break.   Baseline 10-15 min currently   Time 4   Period Weeks   Status New     OT SHORT TERM GOAL #4   Title Pt will improve coordination for ADLs  as shown by improving time on 9-hole peg test by at least 10sec with each UE.   Baseline R-46, L-46.19sec   Time 4   Period Weeks   Status New           OT Long Term Goals - 05/17/17 0934      OT LONG TERM GOAL #1   Title Pt will be independent with updated HEP--check LTGs 07/16/17   Time 8   Period Weeks   Status New     OT LONG TERM GOAL #2   Title Pt will perform environmental scanning with at least 90% accuracy in moderately busy environment for improved safety for community activiites.   Baseline 73% in minimal distracting environment   Time 8   Period Weeks   Status New     OT LONG TERM GOAL #3   Title Pt will perform simple-mod complex financial management tasks mod I.   Baseline 10-15 min currently   Time 8   Period Weeks   Status New     OT LONG TERM GOAL #4   Title Pt will be able to perfom home maintenance task for at least prior to rest break.   Time 8   Period Weeks   Status New               Plan - 06/20/17 0943    Clinical Impression Statement Pt demo difficulty with visual discrimination, visual closure, visual memory, and spatial relations per MVPT-R (Motor-free Visual Perception Test-Revised) and needed incr time for all test items, which will impact driving, potential work tasks, organization, locating items, problem-solving, reading, etc for ADLs.   Rehab Potential Good   Current Impairments/barriers affecting progress: pt financial concerns (self-pay)   OT Frequency 1x / week  recommended 2x/wk however, pt requests 1x/wk due to financial concerns   OT Duration 8 weeks  +eval   OT Treatment/Interventions Self-care/ADL training;Cryotherapy;Parrafin;Therapeutic exercise;DME and/or AE instruction;Building services engineer;Therapeutic activities;Patient/family education;Cognitive remediation/compensation;Balance training;Manual Therapy;Neuromuscular education;Fluidtherapy;Ultrasound;Moist Heat;Energy conservation;Passive range of  motion;Therapeutic exercises;Visual/perceptual remediation/compensation   Plan discussed holding for 2 weeks due to pt's financial concerns/to allow time for financial aide application results; pt issued multiple activities to do at home for visual perceptual skills and encouraged to contact Vocational Rehab.    Check STGs when pt returns.   OT Home Exercise Plan Education provided:  Coordination and Yellow theraband HEP; Vocational Rehab Info; Memory Compensation Strategies   Consulted and Agree with Plan of Care Patient      Patient will benefit from skilled therapeutic intervention in order to improve the following deficits and impairments:  Decreased balance, Decreased activity tolerance, Decreased coordination, Decreased knowledge of use of DME, Decreased safety awareness, Decreased strength, Impaired UE functional use, Decreased knowledge of precautions, Decreased cognition, Decreased endurance, Decreased mobility, Difficulty walking, Impaired vision/preception  Visit Diagnosis: Visuospatial deficit  Other symptoms and signs involving cognitive functions following cerebral infarction  Attention  and concentration deficit  Other lack of coordination  Apraxia  Unsteadiness on feet    Problem List Patient Active Problem List   Diagnosis Date Noted  . Hyperlipidemia   . Tobacco abuse 04/12/2017  . Dyslipidemia 04/12/2017  . PVD (peripheral vascular disease) (HCC) 04/12/2017  . Carotid arterial disease (HCC) 04/12/2017  . CVA (cerebral vascular accident) [facility]) 04/11/2017    [facility] 06/20/2017, 4:35 PM  [place] [facility] [address] [place], [place], [address] Phone: [phone]   Fax:  [phone]  Name: Daniel Stuart MRN: [id number] Date of Birth: [birth year]   Willa Frater, OTR/L [facility] [address] [place], [place]  [address] [phone]  phone [phone] 06/20/17 4:35 PM

## 2017-06-21 NOTE — Therapy (Signed)
[facility] [address] [place], [place], [address] Phone: [phone]   Fax:  [phone]  Physical Therapy Treatment  Patient Details  Name: Daniel Stuart MRN: [id number] Date of Birth: 01-11-1966 Referring Provider: Molli Barrows, [name]  Encounter Date: 06/20/2017      PT End of Session - 06/21/17 2145    Visit Number 5   Number of Visits 5   Date for PT Re-Evaluation 06/17/17   Authorization Type self pay   PT Start Time 1020   PT Stop Time 1100   PT Time Calculation (min) 40 min      No past medical history on file.  Past Surgical History:  Procedure Laterality Date  . FOOT SURGERY    . LOOP RECORDER INSERTION N/A 04/13/2017   Procedure: Loop Recorder Insertion;  Surgeon: Evans Lance, MD;  Location: [facility] CV LAB;  Service: Cardiovascular;  Laterality: N/A;  . TEE WITHOUT CARDIOVERSION N/A 04/13/2017   Procedure: TRANSESOPHAGEAL ECHOCARDIOGRAM (TEE);  Surgeon: Jerline Pain, MD;  Location: [facility] ENDOSCOPY;  Service: Cardiovascular;  Laterality: N/A;    There were no vitals filed for this visit.      Subjective Assessment - 06/21/17 2136    Subjective Pt states he is doing ok with mobility - is ready to finish up with PT due to concerns with lack of insurance coverage for visits - financial concerns   Pertinent History PVD; carotid aterial disease   Patient Stated Goals "get back like I was"   Currently in Pain? No/denies                         [facility] Adult PT Treatment/Exercise - 06/21/17 0001      Ambulation/Gait   Ambulation/Gait Yes   Ambulation/Gait Assistance 6: Modified independent (Device/Increase time)   Ambulation Distance (Feet) 115 Feet   Assistive device None   Gait Pattern Step-through pattern   Ambulation Surface Level;Indoor   Gait velocity 11.62 = 2.82   Stairs Yes   Stairs Assistance 5: Supervision   Stair Management Technique Two rails;Alternating  pattern;Forwards   Number of Stairs 4   Height of Stairs 6   Ramp 6: Modified independent (Device)   Curb 6: Modified independent (Device/increase time)     Standardized Balance Assessment   Standardized Balance Assessment Timed Up and Go Test     Timed Up and Go Test   TUG Normal TUG   Normal TUG (seconds) 11.96     High Level Balance   High Level Balance Activities Side stepping;Braiding;Backward walking;Marching forwards;Tandem walking;Negotiating over obstacles                PT Education - 06/21/17 2144    Education provided Yes   Education Details Reviewed and re-issued balance HEP; pt also instructed in coordination exercise at steps - alterante tapping step with each foot   Person(s) Educated Patient   Methods Explanation;Demonstration;Handout   Comprehension Verbalized understanding;Returned demonstration             PT Long Term Goals - 06/21/17 2139      PT LONG TERM GOAL #1   Title Independent in HEP for balance exercises.  06-16-17   Status Achieved     PT LONG TERM GOAL #2   Title Improve TUG score to </= 13.0 secs without device for reduced fall risk.  06-16-17   Baseline 11.96   Status Achieved     PT  LONG TERM GOAL #3   Title Increase gait velocity from 2.37 ft/sec to >/= 2.8 ft/sec for increased gait efficiency.  06-16-17   Baseline 11.62= 2.82 ft/sec   Status Achieved     PT LONG TERM GOAL #4   Title Amb. 500' on flat, even/uneven surfaces with SBA without LOB.  06-16-17   Status Achieved               Plan - 06/21/17 2146    Clinical Impression Statement Pt has met all LTG's - requesting D/C due to financial concerns with lack of insurance coverage; pt is satisfied with current functional status   Rehab Potential Good   Clinical Impairments Affecting Rehab Potential ataxia, apraxia, coordination deficits   PT Frequency 1x / week   PT Duration 4 weeks   PT Treatment/Interventions ADLs/Self Care Home Management;Gait  training;Stair training;Therapeutic activities;Therapeutic exercise;Balance training;Neuromuscular re-education;Patient/family education   PT Next Visit Plan N/A - D/C   Consulted and Agree with Plan of Care Patient      Patient will benefit from skilled therapeutic intervention in order to improve the following deficits and impairments:  Abnormal gait, Decreased balance, Decreased coordination, Decreased strength, Decreased cognition  Visit Diagnosis: Other abnormalities of gait and mobility  Unsteadiness on feet     Problem List Patient Active Problem List   Diagnosis Date Noted  . Hyperlipidemia   . Tobacco abuse 04/12/2017  . Dyslipidemia 04/12/2017  . PVD (peripheral vascular disease) ([place]) 04/12/2017  . Carotid arterial disease ([place]) 04/12/2017  . CVA (cerebral vascular accident) ([place]) 04/11/2017    PHYSICAL THERAPY DISCHARGE SUMMARY  Visits from Start of Care: 5  Current functional level related to goals / functional outcomes: See above for progress towards goals   Remaining deficits: Cont. Slightly ataxic gait pattern; continued decr. Standing balance with decr. High level balance skills; cont. Decr. Coordination and decr. Motor planning with movement initiation   Education / Equipment: Pt has been instructed in a balance HEP Plan: Patient agrees to discharge.  Patient goals were met. Patient is being discharged due to meeting the stated rehab goals.  ?????  Pt is discharged also due to his request due to financial concerns with lack of insurance coverage (pt is self pay) at this time - states he has applied for financial assistance but has not received any assist. As of this time.        Daniel Stuart, PT 06/21/2017, 9:49 PM  [facility] [address] [place], [place], [address] Phone: [phone]   Fax:  [phone]  Name: Daniel Stuart MRN: [id number] Date of Birth:  [birth year]

## 2017-06-24 ENCOUNTER — Ambulatory Visit: Payer: Medicaid Other

## 2017-06-24 DIAGNOSIS — R482 Apraxia: Secondary | ICD-10-CM

## 2017-06-24 DIAGNOSIS — R4701 Aphasia: Secondary | ICD-10-CM

## 2017-06-24 DIAGNOSIS — R41841 Cognitive communication deficit: Secondary | ICD-10-CM

## 2017-06-24 NOTE — Therapy (Signed)
[facility] [address] [place], [place], [address] Phone: [phone]   Fax:  [phone]  Patient Details  Name: DEONTAY DEIGNAN MRN: [id number] Date of Birth: 1966/10/14 Referring Provider:  Bing Neighbors, [name]  Encounter Date: 06/24/2017   ST - ARRIVED/CANCEL SLP took pt back for ST at 11:07, discussed with pt his self-pay status. Pt does not want to cont to incur charges for therapies without financial assistance. SLP conveyed understanding. Pt agreeable to today's visit be for "touching base" and SLP preparing some papers/tasks for pt to do in the next two weeks while he waits to hear from [facility] financial assistance.  SLP provided pt with [place] [facility] contact info, which pt said was misplaced at home when provided by OT in a previous session. SLP suggested if [facility] financial assistance does not work that pt should try this option.  [facility] Madison Hights ,MS, CCC-SLP  06/24/2017, 11:45 AM  [facility] [address] [place], [place], [address] Phone: [phone]   Fax:  [phone]

## 2017-06-24 NOTE — Patient Instructions (Signed)
   Please contact [place] [facility] in [facility] or in [place] - they may likely be able to help with funding for therapy services as you go through their system. If Voc Rehab thinks that you could get a job if you had additional OT, PT, or ST, they may pay for it.

## 2017-06-27 ENCOUNTER — Encounter: Payer: Self-pay | Admitting: Speech Pathology

## 2017-07-05 ENCOUNTER — Ambulatory Visit: Payer: Self-pay | Attending: Internal Medicine | Admitting: Occupational Therapy

## 2017-07-05 MED FILL — ATORVASTATIN 80 MG TABLET: 80 | 30 days supply | Qty: 30 | Fill #1

## 2017-07-07 ENCOUNTER — Ambulatory Visit: Payer: Self-pay | Admitting: Speech Pathology

## 2017-07-11 ENCOUNTER — Ambulatory Visit: Payer: Self-pay | Admitting: Speech Pathology

## 2017-07-12 ENCOUNTER — Ambulatory Visit (INDEPENDENT_AMBULATORY_CARE_PROVIDER_SITE_OTHER): Payer: Self-pay | Admitting: *Deleted

## 2017-07-12 DIAGNOSIS — I6310 Cerebral infarction due to embolism of unspecified precerebral artery: Secondary | ICD-10-CM

## 2017-07-13 NOTE — Progress Notes (Signed)
Carelink Summary Report / Loop Recorder 

## 2017-07-14 ENCOUNTER — Encounter: Payer: Self-pay | Admitting: Speech Pathology

## 2017-07-15 ENCOUNTER — Other Ambulatory Visit: Payer: Self-pay

## 2017-07-18 ENCOUNTER — Encounter: Payer: Self-pay | Admitting: Speech Pathology

## 2017-07-18 ENCOUNTER — Other Ambulatory Visit (INDEPENDENT_AMBULATORY_CARE_PROVIDER_SITE_OTHER): Payer: Self-pay

## 2017-07-18 DIAGNOSIS — Z114 Encounter for screening for human immunodeficiency virus [HIV]: Secondary | ICD-10-CM

## 2017-07-18 LAB — CUP PACEART REMOTE DEVICE CHECK
Date Time Interrogation Session: 20180912004324
MDC IDC PG IMPLANT DT: 20180613

## 2017-07-19 LAB — HIV ANTIBODY (ROUTINE TESTING W REFLEX): HIV: NONREACTIVE

## 2017-07-19 LAB — EXTRA LAV TOP TUBE

## 2017-07-25 ENCOUNTER — Encounter: Payer: Self-pay | Admitting: Speech Pathology

## 2017-07-29 ENCOUNTER — Other Ambulatory Visit: Payer: Self-pay

## 2017-08-01 ENCOUNTER — Other Ambulatory Visit: Payer: Self-pay

## 2017-08-01 ENCOUNTER — Ambulatory Visit: Payer: Self-pay | Admitting: Speech Pathology

## 2017-08-08 ENCOUNTER — Ambulatory Visit: Payer: Self-pay | Attending: Internal Medicine

## 2017-08-08 MED FILL — ATORVASTATIN 80 MG TABLET: 80 | 30 days supply | Qty: 30 | Fill #2

## 2017-08-10 ENCOUNTER — Encounter: Payer: Self-pay | Admitting: Speech Pathology

## 2017-08-11 ENCOUNTER — Ambulatory Visit (INDEPENDENT_AMBULATORY_CARE_PROVIDER_SITE_OTHER): Payer: Self-pay | Admitting: *Deleted

## 2017-08-11 DIAGNOSIS — I6310 Cerebral infarction due to embolism of unspecified precerebral artery: Secondary | ICD-10-CM

## 2017-08-12 NOTE — Progress Notes (Signed)
Carelink Summary Report / Loop Recorder 

## 2017-08-15 ENCOUNTER — Ambulatory Visit: Payer: Self-pay | Admitting: Speech Pathology

## 2017-08-16 LAB — CUP PACEART REMOTE DEVICE CHECK
Implantable Pulse Generator Implant Date: 20180613
MDC IDC SESS DTM: 20181012014026

## 2017-09-07 ENCOUNTER — Encounter: Payer: Self-pay | Admitting: Occupational Therapy

## 2017-09-07 NOTE — Therapy (Signed)
[facility] [address] [place], [place], [address] Phone: [phone]   Fax:  [phone]  Patient Details  Name: Daniel Stuart MRN: [id number] Date of Birth: September 08, 1966 Referring Provider:  No ref. provider found  Encounter Date: 09/07/2017  OCCUPATIONAL THERAPY DISCHARGE SUMMARY  Visits from Start of Care: 5  Current functional level related to goals / functional outcomes: OT Short Term Goals - 06/20/17 1633      OT SHORT TERM GOAL #1   Title  Pt will be independent with initial HEP for UE strength/coordination.--check STGs 06/16/17    Time  4    Period  Weeks    Status  Educated in [place], but needed review     OT [place] #2   Title  Pt will verbalize understanding of visual and cognitive compensation strategies for ADLs.    Time  4    Period  Weeks    Status  Education provided, but needed review     OT SHORT TERM GOAL #3   Title  Pt will be able to perfom home maintenance task for at least 20mn prior to rest break.    Baseline  10-15 min currently    Time  4    Period  Weeks    Status  Not met/unable to reassess due to pt not returning     OT SHORT TERM GOAL #4   Title  Pt will improve coordination for ADLs as shown by improving time on 9-hole peg test by at least 10sec with each UE.    Baseline  R-46, L-46.19sec    Time  4    Period  Weeks    Status Not met/unable to reassess due to pt not returning       OT Long Term Goals - 05/17/17 0934      OT LONG TERM GOAL #1   Title  Pt will be independent with updated HEP--check LTGs 07/16/17    Time  8    Period  Weeks    Status  New      OT LONG TERM GOAL #2   Title  Pt will perform environmental scanning with at least 90% accuracy in moderately busy environment for improved safety for community activiites.    Baseline  73% in minimal distracting environment    Time  8    Period  Weeks    Status  New      OT LONG TERM GOAL #3   Title   Pt will perform simple-mod complex financial management tasks mod I.    Baseline  10-15 min currently    Time  8    Period  Weeks    Status  New      OT LONG TERM GOAL #4   Title  Pt will be able to perfom home maintenance task for at least 336m prior to rest break.    Time  8    Period  Weeks    Status  New      STGs and LTGs not met/unable to reassess due to pt not returning to OT.    Remaining deficits: Cognitive deficits, visual-perceptual deficits, decr strength/endurance, decr coordination   Education / Equipment: Pt instructed in cognitive/visual HEP, memory compensation strategies, visual compensation strategies, HEP for coordination/strength, and Vocational Rehab info/recommendation.  Pt verbalized understanding of all education provided, but would have benefited from review due to cognitive deficits. Plan: Patient agrees to discharge.  Patient goals were  not met. Patient is being discharged due to not returning since the last visit. due to financial reasons.  Encouraged pt to explore Vocational Rehab and financial assistance as options. ?????        [facility] 09/07/2017, 8:27 AM  [facility] [address] [place], [place], [address] Phone: [phone]   Fax:  [place], OTR/L [facility] [address] [place], [place]  [address] [phone] phone [phone] 09/07/17 8:33 AM

## 2017-09-12 ENCOUNTER — Ambulatory Visit (INDEPENDENT_AMBULATORY_CARE_PROVIDER_SITE_OTHER): Payer: Medicaid Other | Admitting: *Deleted

## 2017-09-12 DIAGNOSIS — I6310 Cerebral infarction due to embolism of unspecified precerebral artery: Secondary | ICD-10-CM

## 2017-09-12 NOTE — Progress Notes (Signed)
Carelink Summary Report / Loop Recorder 

## 2017-09-13 MED FILL — ATORVASTATIN 80 MG TABLET: 80 | 30 days supply | Qty: 30 | Fill #3

## 2017-09-19 LAB — CUP PACEART REMOTE DEVICE CHECK
Date Time Interrogation Session: 20181111024848
MDC IDC PG IMPLANT DT: 20180613

## 2017-10-10 ENCOUNTER — Ambulatory Visit (INDEPENDENT_AMBULATORY_CARE_PROVIDER_SITE_OTHER): Payer: Medicaid Other | Admitting: *Deleted

## 2017-10-10 DIAGNOSIS — I6310 Cerebral infarction due to embolism of unspecified precerebral artery: Secondary | ICD-10-CM

## 2017-10-10 NOTE — Progress Notes (Signed)
Carelink Summary Report / Loop Recorder 

## 2017-10-17 LAB — CUP PACEART REMOTE DEVICE CHECK
Implantable Pulse Generator Implant Date: 20180613
MDC IDC SESS DTM: 20181211031213

## 2017-11-08 ENCOUNTER — Encounter: Payer: Medicaid Other | Admitting: *Deleted

## 2017-11-09 MED FILL — ATORVASTATIN 80 MG TABLET: 80 | 30 days supply | Qty: 30 | Fill #4

## 2017-11-10 ENCOUNTER — Ambulatory Visit (INDEPENDENT_AMBULATORY_CARE_PROVIDER_SITE_OTHER): Payer: Medicaid Other | Admitting: *Deleted

## 2017-11-10 DIAGNOSIS — I6310 Cerebral infarction due to embolism of unspecified precerebral artery: Secondary | ICD-10-CM | POA: Diagnosis not present

## 2017-11-15 NOTE — Progress Notes (Signed)
Carelink Summary Report / Loop Recorder 

## 2017-11-22 LAB — CUP PACEART REMOTE DEVICE CHECK
Date Time Interrogation Session: 20190110031103
MDC IDC PG IMPLANT DT: 20180613

## 2017-11-30 ENCOUNTER — Encounter: Payer: Self-pay | Admitting: Family Medicine

## 2017-11-30 ENCOUNTER — Ambulatory Visit: Payer: Medicaid Other | Admitting: Family Medicine

## 2017-11-30 VITALS — BP 120/84 | HR 80 | Temp 97.9°F | Resp 16 | Ht 66.0 in | Wt 145.0 lb

## 2017-11-30 DIAGNOSIS — Z114 Encounter for screening for human immunodeficiency virus [HIV]: Secondary | ICD-10-CM

## 2017-11-30 DIAGNOSIS — Z1211 Encounter for screening for malignant neoplasm of colon: Secondary | ICD-10-CM

## 2017-11-30 DIAGNOSIS — H539 Unspecified visual disturbance: Secondary | ICD-10-CM

## 2017-11-30 DIAGNOSIS — I639 Cerebral infarction, unspecified: Secondary | ICD-10-CM

## 2017-11-30 DIAGNOSIS — Z1389 Encounter for screening for other disorder: Secondary | ICD-10-CM | POA: Diagnosis not present

## 2017-11-30 LAB — POCT URINALYSIS DIP (DEVICE)
BILIRUBIN URINE: NEGATIVE
Glucose, UA: NEGATIVE mg/dL
HGB URINE DIPSTICK: NEGATIVE
KETONES UR: NEGATIVE mg/dL
NITRITE: NEGATIVE
Protein, ur: NEGATIVE mg/dL
Specific Gravity, Urine: <=1.005 (ref 1.005–1.030)
Urobilinogen, UA: 0.2 mg/dL (ref 0.0–1.0)
pH: 6 (ref 5.0–8.0)

## 2017-11-30 NOTE — Progress Notes (Signed)
Patient ID: Daniel Earthlyimothy E Frenz, male    DOB: 11/15/1965, 52 y.o.   MRN: [id number]  PCP: Bing NeighborsHarris, Ohana Birdwell S, [name]  Chief Complaint  Patient presents with  . Follow-up    6 month for wellness   . Fall    2 weeks    Subjective:  HPI  Daniel Stuart is a 52 y.o. male with history CVA, smoker, hyperlipidemia, presents for chronic condition management and new complaint of a fall due to impaired balance. CVA Daniel Stuart was last seen in office back in office for CVA deficit follow-up. During that time, Daniel Stuart was pending approval for Medicaid and was unable to complete neuro-rehabilitation. During that visit, he complained of intermittent confusion and difficulty with memory most pronounced when driving. Today he reports memory and cognitive deficits have not improved. He has an increased fear of driving as he is afraid that he will get loss or have an accident. He has decreased ability to focus. He is also experiencing problems with this is vision and reports no recent eye exam. Unable to differentiate as whether or not vision has worsened since CVA.  Daniel Stuart reports experiencing a recent fall at home.  Denies any associated syncope however while trying to connect a cord he lost his balance and fell.  Reports his mother had to assist him up to a standing position.  He hit the side of his head and did not follow-up at the ED for further evaluation.  He denies any pain at present, bruising, nausea, vomiting.  His for about a week after incident he was unable to bend his toes although he can bend his toes at present.  Eyes any chest pain, shortness of breath, dizziness, or new weakness. Social History   Socioeconomic History  . Marital status: Legally Separated    Spouse name: Not on file  . Number of children: Not on file  . Years of education: Not on file  . Highest education level: Not on file  Social Needs  . Financial resource strain: Not on file  . Food insecurity - worry: Not on file  .  Food insecurity - inability: Not on file  . Transportation needs - medical: Not on file  . Transportation needs - non-medical: Not on file  Occupational History  . Not on file  Tobacco Use  . Smoking status: Current Every Day Smoker    Packs/day: 1.00    Types: Cigarettes  . Smokeless tobacco: Never Used  Substance and Sexual Activity  . Alcohol use: No  . Drug use: No  . Sexual activity: Not on file  Other Topics Concern  . Not on file  Social History Narrative  . Not on file    Family History  Problem Relation Age of Onset  . Diabetes Mother   . Diabetes Father   . Heart failure Father    Review of Systems  Constitutional: Negative.   Eyes: Positive for visual disturbance.  Respiratory: Negative.   Cardiovascular: Negative.   Genitourinary: Negative.   Neurological:       Gait and balance.  New fall  Psychiatric/Behavioral: Negative.     Patient Active Problem List   Diagnosis Date Noted  . Hyperlipidemia   . Tobacco abuse 04/12/2017  . Dyslipidemia 04/12/2017  . PVD (peripheral vascular disease) (HCC) 04/12/2017  . Carotid arterial disease (HCC) 04/12/2017  . CVA (cerebral vascular accident) (HCC) 04/11/2017    No Known Allergies  Prior to Admission medications   Medication Sig Start Date  End Date Taking? Authorizing Provider  aspirin 81 MG EC tablet Take 1 tablet (81 mg total) by mouth daily. 05/30/17  Yes Bing Neighbors, [name]  atorvastatin (LIPITOR) 80 MG tablet Take 1 tablet (80 mg total) by mouth daily at 6 PM. 05/30/17  Yes Bing Neighbors, [name]  buPROPion [facility] SR) 150 MG 12 hr tablet Day 1-3, take 150 mg once daily. Day 4, increase to 150 mg twice daily. 05/30/17  Yes Bing Neighbors, [name]  nicotine (NICODERM CQ) 21 mg/24hr patch Place 1 patch (21 mg total) onto the skin daily. Patient not taking: Reported on 11/30/2017 05/30/17   Bing Neighbors, [name]    Past Medical, Surgical Family and Social History reviewed and updated.     Objective:   Today's Vitals   11/30/17 1020  BP: 120/84  Pulse: 80  Resp: 16  Temp: 97.9 F (36.6 C)  TempSrc: Oral  SpO2: 99%  Weight: 145 lb (65.8 kg)  Height: 5\' 6"  (1.676 m)    Wt Readings from Last 3 Encounters:  11/30/17 145 lb (65.8 kg)  05/30/17 134 lb 9.6 oz (61.1 kg)  04/18/17 130 lb (59 kg)    Physical Exam  Constitutional: He is oriented to person, place, and time. He appears well-developed and well-nourished.  HENT:  Head: Normocephalic and atraumatic.  Neck: Normal range of motion. Neck supple.  Cardiovascular: Normal rate, regular rhythm, normal heart sounds and intact distal pulses.  Pulmonary/Chest: Effort normal and breath sounds normal.  Lymphadenopathy:    He has no cervical adenopathy.  Neurological: He is alert and oriented to person, place, and time. Gait abnormal.  Gait slightly slowed however no visible evidence of imbalance or abnormal coordination.  Psychiatric: He has a normal mood and affect. His speech is normal and behavior is normal. Thought content normal. He exhibits abnormal recent memory.    Assessment & Plan:  1. Cerebrovascular accident (CVA), unspecified mechanism (HCC)-patient currently has insurance now would like to resume neuro rehab.  He was unable to participate in neuro rehab previously as he had no financial resources to cover the cost of therapy.  Reports some gait and balance instability and has had an episodic recent fall.  Have some mild cognitive impairment mostly affecting memory.  Order placed for referral to neuro rehab.  2.  HIV screening-Mr. Victorio had a reactive HIV test during his hospitalization back in June.  Since that time he has had a total of 2 nonreactive HIV with reflex test.  I will rescreen day for HIV and if testing is negative we will not repeat any additional screenings unless exposure risk is present.  Patient has no known current or past risk factors that would increase the likelihood of a reactive HIV  infection.  3.  Visual disturbance-reports no association with recent CVA.  Reports a long history of decreased vision.  No recent eye exam.  Will refer to [facility].  4.  Colon cancer screening-placed an order for DNA stool testing with [facility].  Has no known family history of colon cancer or stomach cancer.   Godfrey Pick. Tiburcio Pea, MSN, [name]-C [facility]  [address] [place], [place] [address] [phone]

## 2017-12-01 ENCOUNTER — Telehealth: Payer: Self-pay | Admitting: Family Medicine

## 2017-12-01 DIAGNOSIS — E785 Hyperlipidemia, unspecified: Secondary | ICD-10-CM

## 2017-12-01 LAB — COMPREHENSIVE METABOLIC PANEL
A/G RATIO: 1.4 (ref 1.2–2.2)
ALBUMIN: 4.6 g/dL (ref 3.5–5.5)
ALT: 19 IU/L (ref 0–44)
AST: 19 IU/L (ref 0–40)
Alkaline Phosphatase: 148 IU/L — ABNORMAL HIGH (ref 39–117)
BUN / CREAT RATIO: 5 — AB (ref 9–20)
BUN: 5 mg/dL — ABNORMAL LOW (ref 6–24)
Bilirubin Total: 0.4 mg/dL (ref 0.0–1.2)
CALCIUM: 9.4 mg/dL (ref 8.7–10.2)
CO2: 24 mmol/L (ref 20–29)
CREATININE: 1.1 mg/dL (ref 0.76–1.27)
Chloride: 102 mmol/L (ref 96–106)
GFR, EST AFRICAN AMERICAN: 88 mL/min/{1.73_m2} (ref >59)
GFR, EST NON AFRICAN AMERICAN: 76 mL/min/{1.73_m2} (ref >59)
GLOBULIN, TOTAL: 3.3 g/dL (ref 1.5–4.5)
Glucose: 111 mg/dL — ABNORMAL HIGH (ref 65–99)
POTASSIUM: 4.2 mmol/L (ref 3.5–5.2)
SODIUM: 143 mmol/L (ref 134–144)
TOTAL PROTEIN: 7.9 g/dL (ref 6.0–8.5)

## 2017-12-01 LAB — HIV ANTIBODY (ROUTINE TESTING W REFLEX): HIV Screen 4th Generation wRfx: NONREACTIVE

## 2017-12-01 NOTE — Telephone Encounter (Signed)
Tried to contact patient no answer 

## 2017-12-01 NOTE — Telephone Encounter (Signed)
Left a vm for patient to callback 

## 2017-12-01 NOTE — Telephone Encounter (Signed)
Contact patient to advise his HIV test was negative and other labs consistent with baseline. He will need to return as scheduled for fasting lipid panel in 1 week and at the appointment he will need to sign an authorization form for the colon cancer screening kit we discussed during his recent visit.   Daniel Stuart. Kenton Kingfisher, MSN, [name]-C [facility]  [address] [place], [place] [address] [phone]

## 2017-12-04 NOTE — Telephone Encounter (Signed)
See prior note. Please attempt to reach patient again. Thanks

## 2017-12-05 NOTE — Telephone Encounter (Signed)
Left a vm for patient to callback regarding results 

## 2017-12-06 NOTE — Telephone Encounter (Signed)
Left a vm for patient to callback 

## 2017-12-09 ENCOUNTER — Ambulatory Visit (INDEPENDENT_AMBULATORY_CARE_PROVIDER_SITE_OTHER): Payer: Medicaid Other | Admitting: *Deleted

## 2017-12-09 DIAGNOSIS — I6310 Cerebral infarction due to embolism of unspecified precerebral artery: Secondary | ICD-10-CM | POA: Diagnosis not present

## 2017-12-09 NOTE — Telephone Encounter (Signed)
Unable to contact patient by phone so a letter will be sent out.

## 2017-12-12 MED FILL — ATORVASTATIN 80 MG TABLET: 80 | 30 days supply | Qty: 30 | Fill #5

## 2017-12-12 NOTE — Progress Notes (Signed)
Carelink Summary Report / Loop Recorder 

## 2017-12-23 ENCOUNTER — Encounter: Payer: Self-pay | Admitting: Physical Therapy

## 2017-12-23 ENCOUNTER — Ambulatory Visit: Payer: Medicaid Other | Attending: Family Medicine | Admitting: Physical Therapy

## 2017-12-23 DIAGNOSIS — R2681 Unsteadiness on feet: Secondary | ICD-10-CM

## 2017-12-23 DIAGNOSIS — M6281 Muscle weakness (generalized): Secondary | ICD-10-CM | POA: Diagnosis present

## 2017-12-23 DIAGNOSIS — I69318 Other symptoms and signs involving cognitive functions following cerebral infarction: Secondary | ICD-10-CM

## 2017-12-23 DIAGNOSIS — R2689 Other abnormalities of gait and mobility: Secondary | ICD-10-CM | POA: Diagnosis not present

## 2017-12-23 NOTE — Therapy (Signed)
[facility] [address] [place], [place], [address] Phone: [phone]   Fax:  [phone]  Physical Therapy Evaluation  Patient Details  Name: Daniel Stuart MRN: [id number] Date of Birth: October 09, 1966 Referring Provider: Joaquin Courts ([name])   Encounter Date: 12/23/2017  PT End of Session - 12/23/17 0955    Visit Number  1    Number of Visits  12    Date for PT Re-Evaluation  02/21/18    Authorization Type  Medicaid-awaiting auth    PT Start Time  0850    PT Stop Time  0930    PT Time Calculation (min)  40 min    Activity Tolerance  Patient tolerated treatment well    Behavior During Therapy  [facility] for tasks assessed/performed       History reviewed. No pertinent past medical history.  Past Surgical History:  Procedure Laterality Date  . FOOT SURGERY    . LOOP RECORDER INSERTION N/A 04/13/2017   Procedure: Loop Recorder Insertion;  Surgeon: Marinus Maw, MD;  Location: [facility] INVASIVE CV LAB;  Service: Cardiovascular;  Laterality: N/A;  . TEE WITHOUT CARDIOVERSION N/A 04/13/2017   Procedure: TRANSESOPHAGEAL ECHOCARDIOGRAM (TEE);  Surgeon: Jake Bathe, MD;  Location: [facility] ENDOSCOPY;  Service: Cardiovascular;  Laterality: N/A;    There were no vitals filed for this visit.   Subjective Assessment - 12/23/17 0853    Subjective  Started therapy back after my initial CVA, but had to stop due to finances.  Feel like I've gone backwards a bit, because I fell and hit my head about a month ago.  Feel like my balance and walking isn't as good as it was.  Have only had one fall in the past 6 month, where LLE got caught trying to attach antenna to TV.  Initial CVA was at least one year ago, and feels R side was more affected.    Pertinent History  CVA (per chart 04/2017, with MRI showing acute infarct areas both cerebral hemispheres; chronic infarcts fronal and parietal cortex and white matter-watershed chronic infarction);  hyperlipidemia, and peripheral vascular disease, hx tobacco abuse    Patient Stated Goals  Pt's goals for physical therapy are to get back to as normal as I can.    Currently in Pain?  No/denies         [facility] PT Assessment - 12/23/17 0857      Assessment   Medical Diagnosis  Hx of CVA    Referring Provider  Joaquin Courts ([name])    Onset Date/Surgical Date  11/30/17 MD visit; CVA 04/2017    Hand Dominance  Right      Precautions   Precautions  Fall      Balance Screen   Has the patient fallen in the past 6 months  Yes    How many times?  1    Has the patient had a decrease in activity level because of a fear of falling?   Yes    Is the patient reluctant to leave their home because of a fear of falling?   Yes      Home Environment   Living Environment  Private residence    Living Arrangements  Parent Mother    Available Help at Discharge  Family    Type of Home  Apartment    Home Access  Stairs to enter    Entrance Stairs-Number of Steps  3    Entrance Stairs-Rails  Right 3 steps  to sidewalk, then 3 steps into home    Home Layout  One level    Home Equipment  [place] - single point      Prior Function   Level of Independence  Independent Prior to CVA, was independent and working    Marine scientist  On disability    Leisure  Was trying to walk in neighborhood (when weather isn't too bad)      Observation/Other Assessments   Focus on Therapeutic Outcomes (FOTO)   NA      Sensation   Light Touch  Impaired by gross assessment "Feels different-numbness-on the left"    Additional Comments  Reports feeling of numbness on bottom R foot      Posture/Postural Control   Posture/Postural Control  Postural limitations    Postural Limitations  Forward head;Rounded Shoulders      ROM / Strength   AROM / PROM / Strength  Strength      Strength   Overall Strength  Deficits    Overall Strength Comments  R ankle dorsiflexion 1/5    Strength Assessment Site  Hip;Knee;Ankle    Right/Left  Hip  Right;Left    Right Hip Flexion  4/5    Left Hip Flexion  4/5    Right/Left Knee  Right;Left    Right Knee Flexion  3+/5    Right Knee Extension  4/5    Left Knee Flexion  4/5    Left Knee Extension  4/5    Right/Left Ankle  Right;Left    Right Ankle Dorsiflexion  1/5    Left Ankle Dorsiflexion  4/5      Transfers   Transfers  Sit to Stand;Stand to Sit    Sit to Stand  6: Modified independent (Device/Increase time);With upper extremity assist;From chair/3-in-1    Five time sit to stand comments   33.84    Stand to Sit  6: Modified independent (Device/Increase time);With upper extremity assist;To chair/3-in-1      Ambulation/Gait   Ambulation/Gait  Yes    Ambulation/Gait Assistance  6: Modified independent (Device/Increase time)    Ambulation Distance (Feet)  200 Feet    Assistive device  None    Gait Pattern  Step-to pattern;Step-through pattern;Decreased arm swing - right;Decreased arm swing - left;Decreased step length - right;Decreased step length - left;Decreased dorsiflexion - right;Decreased dorsiflexion - left;Decreased trunk rotation;Trunk flexed;Narrow base of support;Poor foot clearance - left;Poor foot clearance - right;Festinating;Ataxic    Ambulation Surface  Level;Indoor    Gait velocity  19.91 sec = 1.65 ft/sec    Stairs  Yes    Stairs Assistance  6: Modified independent (Device/Increase time)    Stair Management Technique  Two rails;Step to pattern;Forwards    Number of Stairs  4    Height of Stairs  6      Standardized Balance Assessment   Standardized Balance Assessment  Timed Up and Go Test;Dynamic Gait Index      Dynamic Gait Index   Level Surface  Moderate Impairment    Change in Gait Speed  Moderate Impairment    Gait with Horizontal Head Turns  Mild Impairment    Gait with Vertical Head Turns  Mild Impairment    Gait and Pivot Turn  Mild Impairment    Step Over Obstacle  Severe Impairment    Step Around Obstacles  Mild Impairment    Steps   Moderate Impairment    Total Score  11    DGI comment:  Scores <19/24 indicate  increased fall risk.      Timed Up and Go Test   Normal TUG (seconds)  26.16    TUG Comments  Scores >13.5 seconds indicate increased fall risk             Objective measurements completed on examination: See above findings.                PT Short Term Goals - 12/23/17 1001      PT SHORT TERM GOAL #1   Title  Pt will be independent with HEP for improved balance, transfers and gait.  TARGET 01/20/18    Baseline  No formal HEP, decr balance per DGI and TUG; decr. strength per MMT    Time  3    Period  Weeks    Status  New    Target Date  01/20/18      PT SHORT TERM GOAL #2   Title  Pt will improve 5x sit<>stand test to less than or equal to 25 seconds, for improved transfer efficiency and safety.    Baseline  5x sit<>stand:  33.84 sec (normal for 60-69 year olds is 11.4 sec)    Time  3    Period  Weeks    Status  New    Target Date  01/20/18      PT SHORT TERM GOAL #3   Title  Pt will improve TUG score to less than or equal to 20 seconds for decreased fall risk.    Baseline  TUG  26.16 sec (scores >13.5 sec indicate increased fall risk)    Time  3    Period  Weeks    Status  New    Target Date  01/20/18      PT SHORT TERM GOAL #4   Title  Pt will verbalize understanding of fall prevention in home environment.      Baseline  At fall risk per gait velocity, DGI, and TUG scores     Time  3    Period  Weeks    Status  New    Target Date  01/20/18        PT Long Term Goals - 12/23/17 1004      PT LONG TERM GOAL #1   Title  Pt will verbalize plans for ongoing community fitness upon D/C from PT.  TARGET 02/17/18    Baseline  No formal HEP/community fitness plan    Time  8    Period  Weeks    Status  New    Target Date  02/17/18      PT LONG TERM GOAL #2   Title  Pt will improve 5x sit<>stand to less than or equal to 15 seconds for improved efficiency and safety with  transfers.    Baseline  33.84 at eval (95-58 year old norm=11.4 sec)    Time  8    Period  Weeks    Status  New    Target Date  01/20/18      PT LONG TERM GOAL #3   Title  Pt will improve gait velocity to at least 2 ft/sec for improved gait efficiency and safety/decreased risk of falls.    Baseline  gait velocity at eval:  1.65 ft/sec (<1.8 ft/sec indicative of recurrent fall risk)    Time  8    Period  Weeks    Status  New    Target Date  02/17/18      PT LONG TERM GOAL #  4   Title  Pt will improve Dynamic Gait Index score to at least 19/24 for decreased fall risk.    Baseline  11/24 at eval (<19/24 indicates increased fall risk)    Time  8    Period  Weeks    Status  New    Target Date  01/20/18      PT LONG TERM GOAL #5   Title  Pt will improve TUG score to less than or equal to 13.5 seconds for decreased fall risk    Baseline  26.16 at eval (>13.5 sec indicates increased fall risk)    Time  8    Period  Weeks    Status  New    Target Date  01/20/18             Plan - 12/23/17 0955    Clinical Impression Statement  Pt is a 52 year old male who presents to OP PT with history of CVA on 04/11/17, where he reports more R sided weakness, with balance and gait difficulties.  He started therapy, but had a short course of therapy in summer 2018, when he ended therapy due to financial concerns.  Since then, he has had one fall and feels his balance and gait are worsened.  He presents with decreased balance, decreased strength, decreased timing and coordination of gait, decreased independence with transfers, slowed gait pattern, abnormal posture.  He is at fall risk per gait velocity, DGI and TUG scores.  Prior to CVA, he was independent.  He would benefit from skilled PT to address the above stated deficits to decrease fall risk and improve functional mobility.    History and Personal Factors relevant to plan of care:  PMH >3 co-morbidities, >3 systems involved, at fall risk per 3  objective measures    Clinical Presentation  Evolving    Clinical Presentation due to:  Hx of CVA, 1 fall in past 6 months, fall risk    Clinical Decision Making  Moderate    Rehab Potential  Good    Clinical Impairments Affecting Rehab Potential  Motivated for rehab; length of time since initial CVA    PT Frequency  Other (comment) 1x/wk for 3 visits over one month; then 2x/wk for 4 weeks    PT Duration  Other (comment)    PT Treatment/Interventions  DME Instruction;Gait training;Stair training;Functional mobility training;Therapeutic activities;Therapeutic exercise;Balance training;Patient/family education;Neuromuscular re-education    PT Next Visit Plan  Review HEP from previous therapy bout in summer 2018; add to HEP as needed for lower extremity strengthening (weightbearing positions), balance activities    Consulted and Agree with Plan of Care  Patient       Patient will benefit from skilled therapeutic intervention in order to improve the following deficits and impairments:  Abnormal gait, Decreased balance, Decreased coordination, Decreased mobility, Difficulty walking, Decreased strength, Postural dysfunction  Visit Diagnosis: Other abnormalities of gait and mobility  Other symptoms and signs involving cognitive functions following cerebral infarction  Unsteadiness on feet  Muscle weakness (generalized)     Problem List Patient Active Problem List   Diagnosis Date Noted  . Hyperlipidemia   . Tobacco abuse 04/12/2017  . Dyslipidemia 04/12/2017  . PVD (peripheral vascular disease) (HCC) 04/12/2017  . Carotid arterial disease (HCC) 04/12/2017  . CVA (cerebral vascular accident) (HCC) 04/11/2017    Reigna Ruperto W. 12/23/2017, 10:09 AM  Gean MaidensMARRIOTT,Jereme Loren W., PT  [facility] [address] [place], [place], [address] Phone:  [phone]   Fax:  [phone]  Name: Daniel Stuart MRN: [id number] Date of Birth:  03-31-1966

## 2018-01-02 ENCOUNTER — Ambulatory Visit: Payer: Medicaid Other | Admitting: Physical Therapy

## 2018-01-03 LAB — CUP PACEART REMOTE DEVICE CHECK
Implantable Pulse Generator Implant Date: 20180613
MDC IDC SESS DTM: 20190209031045

## 2018-01-09 ENCOUNTER — Ambulatory Visit: Payer: Medicaid Other | Attending: Family Medicine | Admitting: Physical Therapy

## 2018-01-11 ENCOUNTER — Ambulatory Visit (INDEPENDENT_AMBULATORY_CARE_PROVIDER_SITE_OTHER): Payer: Medicaid Other | Admitting: *Deleted

## 2018-01-11 DIAGNOSIS — I6310 Cerebral infarction due to embolism of unspecified precerebral artery: Secondary | ICD-10-CM | POA: Diagnosis not present

## 2018-01-12 NOTE — Progress Notes (Signed)
Carelink Summary Report / Loop Recorder 

## 2018-01-19 ENCOUNTER — Other Ambulatory Visit: Payer: Self-pay | Admitting: Family Medicine

## 2018-01-20 ENCOUNTER — Ambulatory Visit: Payer: Medicaid Other | Admitting: Physical Therapy

## 2018-01-25 ENCOUNTER — Ambulatory Visit: Payer: Medicaid Other | Admitting: Physical Therapy

## 2018-01-30 MED FILL — ATORVASTATIN 80 MG TABLET: 80 | 30 days supply | Qty: 30 | Fill #0

## 2018-02-13 ENCOUNTER — Ambulatory Visit (INDEPENDENT_AMBULATORY_CARE_PROVIDER_SITE_OTHER): Payer: Medicaid Other | Admitting: *Deleted

## 2018-02-13 DIAGNOSIS — I6310 Cerebral infarction due to embolism of unspecified precerebral artery: Secondary | ICD-10-CM

## 2018-02-14 NOTE — Progress Notes (Signed)
Carelink Summary Report / Loop Recorder 

## 2018-02-20 LAB — CUP PACEART REMOTE DEVICE CHECK
Implantable Pulse Generator Implant Date: 20180613
MDC IDC SESS DTM: 20190314033934

## 2018-03-06 MED FILL — ATORVASTATIN 80 MG TABLET: 80 | 30 days supply | Qty: 30 | Fill #1

## 2018-03-10 IMAGING — MR MR HEAD W/O CM
9 of 13 series · 30 of 48 positions shown · non-contrast
Comparison: None.

CLINICAL DATA: Headache.  Right-sided numbness

EXAM:
MRI HEAD WITHOUT CONTRAST
TECHNIQUE: Multiplanar, multiecho pulse sequences of the brain and surrounding
structures were obtained without intravenous contrast.

[Series 3: DWI · axial · 3.0mm · 1.09mm/px · z∈[-46,+110]mm · 7 of 106 slices shown (1 of 4)]
[im 1/106]
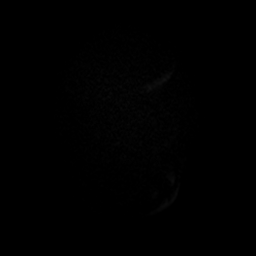
[im 18/106]
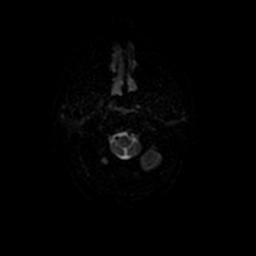
[im 36/106]
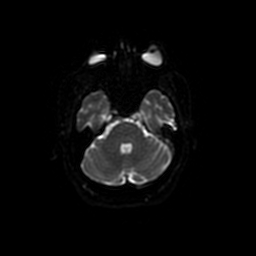
[im 53/106]
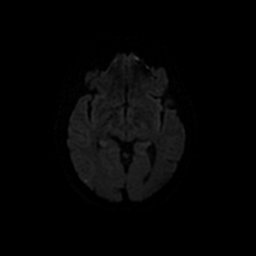
[im 71/106]
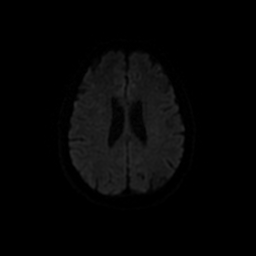
[im 88/106]
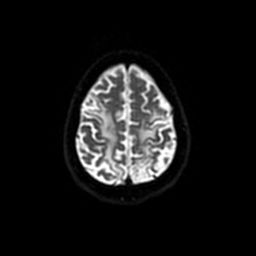
[im 106/106]
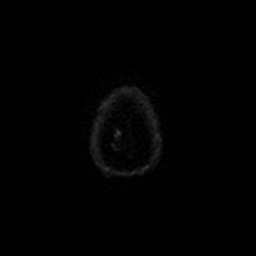

[Series 4: T1 · sagittal · 5.0mm · 0.47mm/px · 1 of 24 slices shown]
[im 1/24]
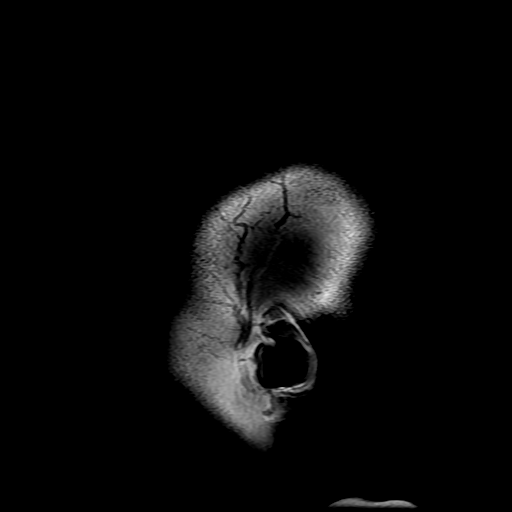

[Series 5: DWI · coronal · 5.0mm · 1.09mm/px · 4 of 68 slices shown (2 of 4)]
[im 1/68]
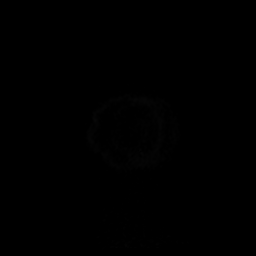
[im 23/68]
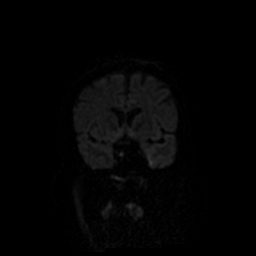
[im 45/68]
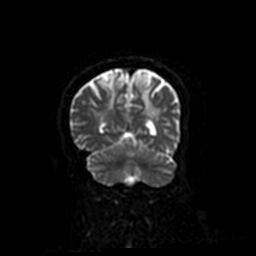
[im 68/68]
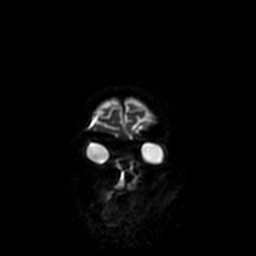

[Series 6: T2 · axial · 5.0mm · 0.43mm/px · 1 of 24 slices shown (1 of 2)]
[im 1/24]
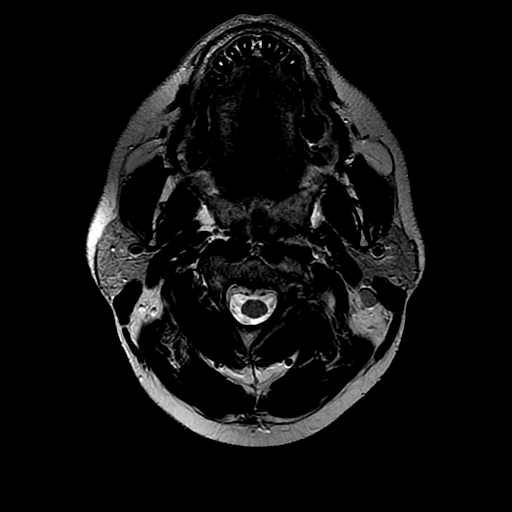

[Series 7: FLAIR · axial · 3.0mm · 0.43mm/px · z∈[-51,+110]mm · 2 of 28 slices shown (1 of 2)]
[im 1/28]
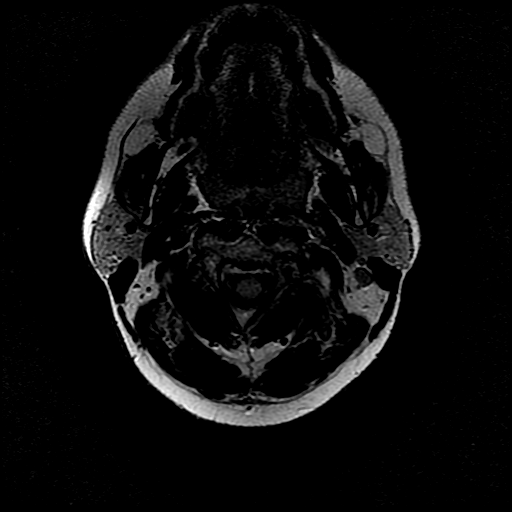
[im 28/28]
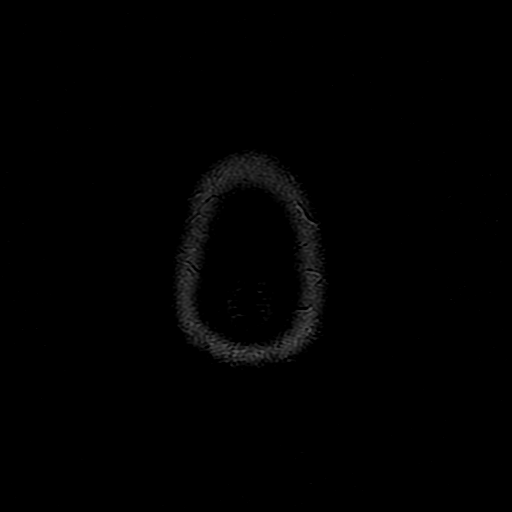

[Series 10: FLAIR · sagittal · 1.6mm · 0.47mm/px · 9 of 192 slices shown (2 of 2)]
[im 1/192]
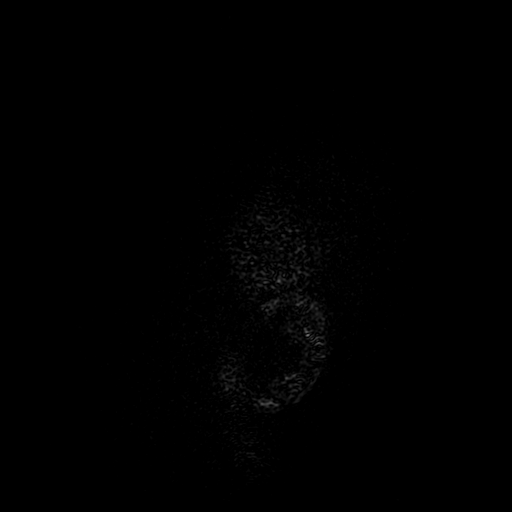
[im 35/192]
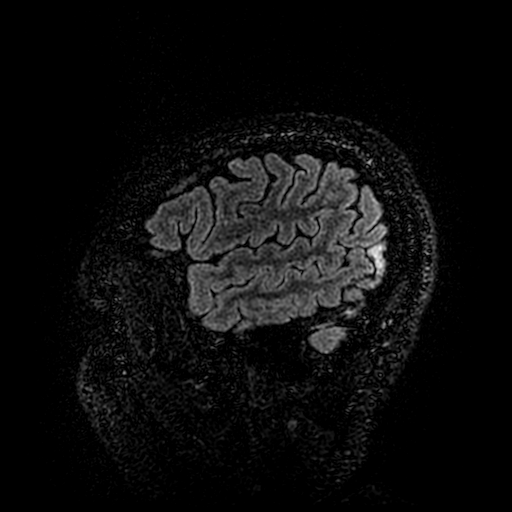
[im 53/192]
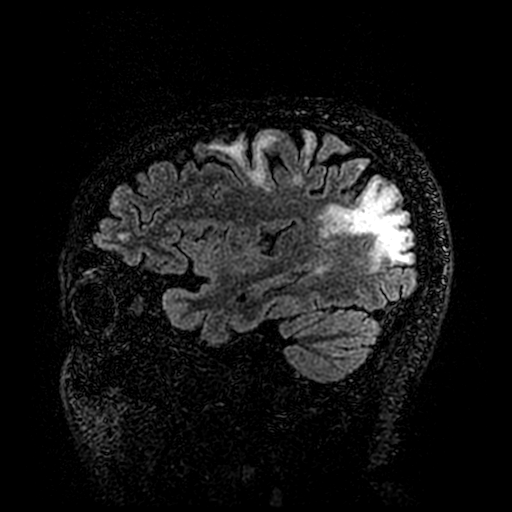
[im 87/192]
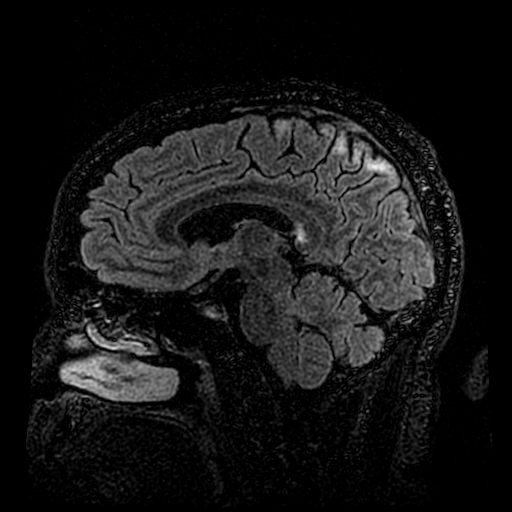
[im 105/192]
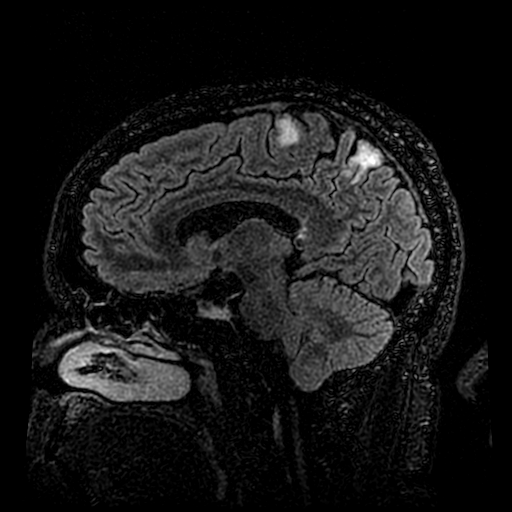
[im 139/192]
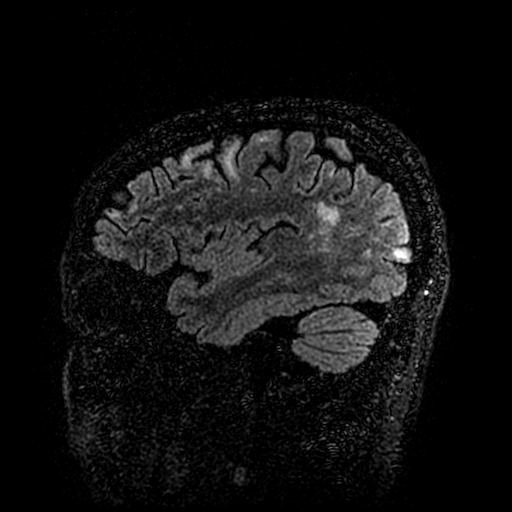
[im 157/192]
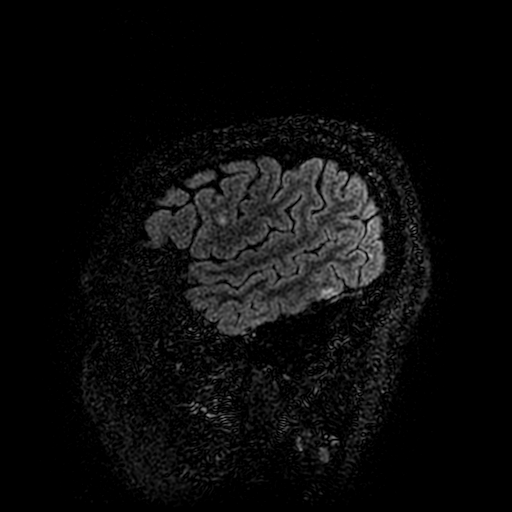
[im 174/192]
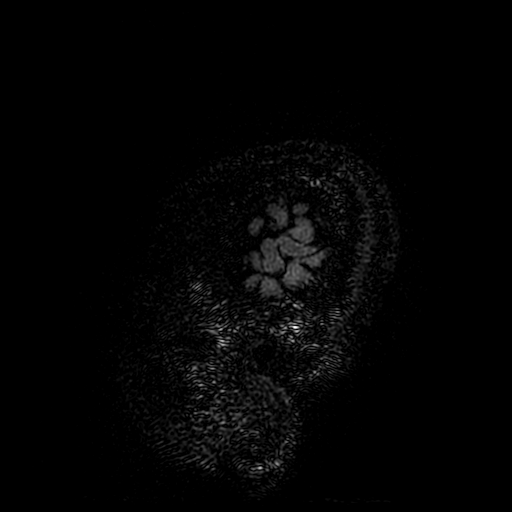
[im 192/192]
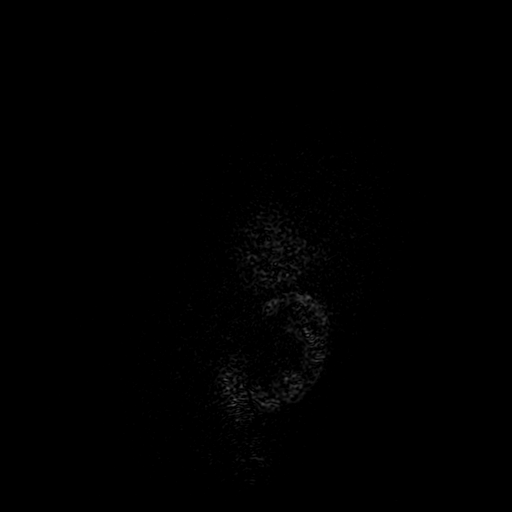

[Series 11: T2 · coronal · 5.0mm · 0.47mm/px · 1 of 24 slices shown (2 of 2)]
[im 1/24]
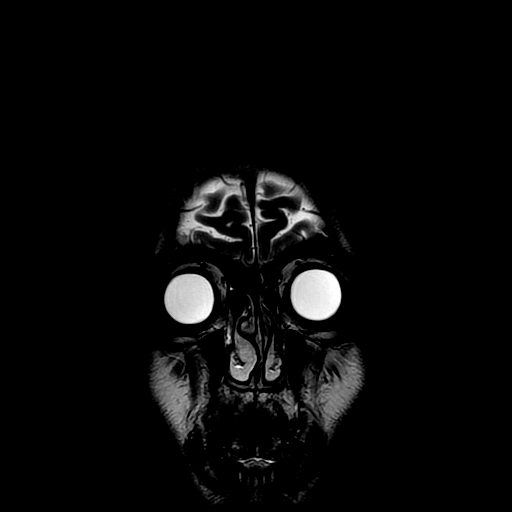

[Series 300: DWI · axial · 3.0mm · 1.09mm/px · z∈[-46,+110]mm · 3 of 53 slices shown (3 of 4)]
[im 1/53]
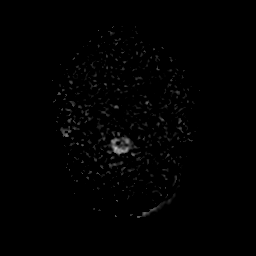
[im 27/53]
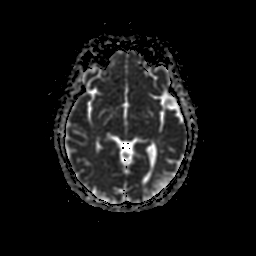
[im 53/53]
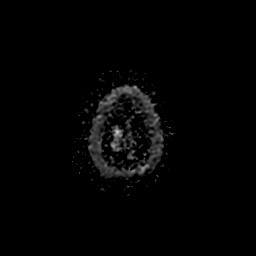

[Series 500: DWI · coronal · 5.0mm · 1.09mm/px · 2 of 34 slices shown (4 of 4)]
[im 1/34]
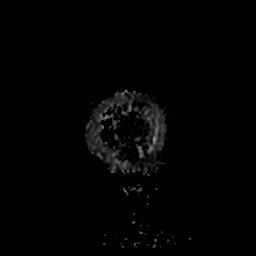
[im 34/34]
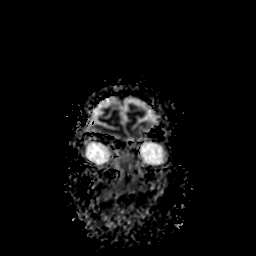

[30 of 48 positions shown; findings below may reference images not displayed]

FINDINGS: Brain: Multiple small areas of restricted diffusion in both cerebral
hemispheres compatible with acute infarcts, likely embolic. These
areas all measure under 1 cm. No infarct in the brainstem or
cerebellum.

Chronic infarcts in the frontal and parietal cortex and white matter
bilaterally, with the appearance of watershed chronic infarction.
Chronic infarcts in the occipital lobes bilaterally and bilateral
cerebral white matter chronic infarcts. Posterior fossa normal.
Negative for hemorrhage or mass. Ventricle size normal. No shift of
the midline structures

Vascular: Occlusion of the left internal carotid artery of unknown
chronicity. Right internal carotid artery and basilar artery have
normal flow voids.

Skull and upper cervical spine: Negative

Sinuses/Orbits: Negative

Other: None
IMPRESSION: Multiple small areas of acute infarct in both cerebral hemisphere
most likely due to emboli

Occlusion of left internal carotid artery.

Extensive chronic ischemia bilaterally. Probable chronic watershed
infarct in both cerebral hemispheres.

## 2018-03-15 LAB — CUP PACEART REMOTE DEVICE CHECK
Implantable Pulse Generator Implant Date: 20180613
MDC IDC SESS DTM: 20190416060915

## 2018-03-20 ENCOUNTER — Ambulatory Visit (INDEPENDENT_AMBULATORY_CARE_PROVIDER_SITE_OTHER): Payer: Medicaid Other | Admitting: *Deleted

## 2018-03-20 DIAGNOSIS — I6310 Cerebral infarction due to embolism of unspecified precerebral artery: Secondary | ICD-10-CM

## 2018-03-21 NOTE — Progress Notes (Signed)
Carelink Summary Report / Loop Recorder 

## 2018-04-11 LAB — CUP PACEART REMOTE DEVICE CHECK
Date Time Interrogation Session: 20190519114002
Implantable Pulse Generator Implant Date: 20180613

## 2018-04-21 ENCOUNTER — Ambulatory Visit (INDEPENDENT_AMBULATORY_CARE_PROVIDER_SITE_OTHER): Payer: Medicaid Other | Admitting: *Deleted

## 2018-04-21 DIAGNOSIS — I6310 Cerebral infarction due to embolism of unspecified precerebral artery: Secondary | ICD-10-CM | POA: Diagnosis not present

## 2018-04-21 NOTE — Progress Notes (Signed)
Carelink Summary Report / Loop Recorder 

## 2018-04-24 MED FILL — ATORVASTATIN 80 MG TABLET: 80 | 30 days supply | Qty: 30 | Fill #2

## 2018-05-24 ENCOUNTER — Ambulatory Visit (INDEPENDENT_AMBULATORY_CARE_PROVIDER_SITE_OTHER): Payer: Medicaid Other | Admitting: *Deleted

## 2018-05-24 DIAGNOSIS — I6310 Cerebral infarction due to embolism of unspecified precerebral artery: Secondary | ICD-10-CM | POA: Diagnosis not present

## 2018-05-25 LAB — CUP PACEART REMOTE DEVICE CHECK
Date Time Interrogation Session: 20190621113758
MDC IDC PG IMPLANT DT: 20180613

## 2018-05-25 NOTE — Progress Notes (Signed)
Carelink Summary Report / Loop Recorder 

## 2018-05-26 MED FILL — ATORVASTATIN 80 MG TABLET: 80 | 30 days supply | Qty: 30 | Fill #3

## 2018-05-30 ENCOUNTER — Ambulatory Visit: Payer: Medicaid Other | Admitting: Family Medicine

## 2018-05-30 ENCOUNTER — Encounter: Payer: Self-pay | Admitting: Family Medicine

## 2018-05-30 VITALS — Ht 66.0 in | Wt 137.0 lb

## 2018-05-30 DIAGNOSIS — Z131 Encounter for screening for diabetes mellitus: Secondary | ICD-10-CM

## 2018-05-30 DIAGNOSIS — Z09 Encounter for follow-up examination after completed treatment for conditions other than malignant neoplasm: Secondary | ICD-10-CM

## 2018-05-30 DIAGNOSIS — K5900 Constipation, unspecified: Secondary | ICD-10-CM

## 2018-05-30 DIAGNOSIS — Z8673 Personal history of transient ischemic attack (TIA), and cerebral infarction without residual deficits: Secondary | ICD-10-CM | POA: Diagnosis not present

## 2018-05-30 DIAGNOSIS — F419 Anxiety disorder, unspecified: Secondary | ICD-10-CM

## 2018-05-30 DIAGNOSIS — N390 Urinary tract infection, site not specified: Secondary | ICD-10-CM

## 2018-05-30 DIAGNOSIS — Z720 Tobacco use: Secondary | ICD-10-CM | POA: Diagnosis not present

## 2018-05-30 DIAGNOSIS — R8290 Unspecified abnormal findings in urine: Secondary | ICD-10-CM

## 2018-05-30 LAB — POCT GLYCOSYLATED HEMOGLOBIN (HGB A1C): Hemoglobin A1C: 5.3 % (ref 4.0–5.6)

## 2018-05-30 LAB — POCT URINALYSIS DIP (MANUAL ENTRY)
Bilirubin, UA: NEGATIVE
Blood, UA: NEGATIVE
Glucose, UA: NEGATIVE mg/dL
Ketones, POC UA: NEGATIVE mg/dL
Nitrite, UA: NEGATIVE
Protein Ur, POC: NEGATIVE mg/dL
Spec Grav, UA: 1.01 (ref 1.010–1.025)
Urobilinogen, UA: 1 E.U./dL
pH, UA: 5.5 (ref 5.0–8.0)

## 2018-05-30 MED ORDER — DOCUSATE SODIUM 50 MG PO CAPS: 50.0000 mg | ORAL_CAPSULE | Freq: Every day | ORAL | 2 refills | Status: DC

## 2018-05-30 MED ORDER — SULFAMETHOXAZOLE-TRIMETHOPRIM 800-160 MG PO TABS: 1.0000 | ORAL_TABLET | Freq: Two times a day (BID) | ORAL | 0 refills | Status: DC

## 2018-05-30 MED ORDER — BUSPIRONE HCL 5 MG PO TABS: 5.0000 mg | ORAL_TABLET | Freq: Two times a day (BID) | ORAL | 1 refills | Status: DC

## 2018-05-30 MED FILL — SULFAMETHOXAZOLE-TMP DS TAB: 800-160 | 7 days supply | Qty: 14 | Fill #0

## 2018-05-30 MED FILL — busPIRone HCL 5 MG TABS: 5 | 30 days supply | Qty: 60 | Fill #0

## 2018-05-30 NOTE — Progress Notes (Signed)
Follow Up  Subjective:    Patient ID: Daniel Stuart, male    DOB: 01-09-1966, 52 y.o.   MRN: [id number]   Chief Complaint  Patient presents with  . Follow-up    6 month on CVA   HPI  History reviewed. No pertinent past medical history.  Current Status: Since his last office visit, he is doing well with no complaints. He arrives today using a cane. He is s/p: stroke one year ago. He does continue to have right sided and left leg weakness. He has not had any headaches, visual changes, dizziness, and falls.  He denies fevers, chills, fatigue. He denies recent infections, weight loss, and night sweats.  No chest pain, heart palpitations, cough and shortness of breath reported. No reports of GI problems such as nausea, vomiting, diarrhea, and constipation. He has no reports of blood in stools, dysuria and hematuria. No depression or anxiety, and denies suicidal ideations, homicidal ideations, or auditory hallucinations. He denies pain today.   Smokes less than a pack/day.  History reviewed. No pertinent past medical history. Family History  Problem Relation Age of Onset  . Diabetes Mother   . Diabetes Father   . Heart failure Father     Social History   Socioeconomic History  . Marital status: Legally Separated    Spouse name: Not on file  . Number of children: Not on file  . Years of education: Not on file  . Highest education level: Not on file  Occupational History  . Not on file  Social Needs  . Financial resource strain: Not on file  . Food insecurity:    Worry: Not on file    Inability: Not on file  . Transportation needs:    Medical: Not on file    Non-medical: Not on file  Tobacco Use  . Smoking status: Current Every Day Smoker    Packs/day: 1.00    Types: Cigarettes  . Smokeless tobacco: Never Used  Substance and Sexual Activity  . Alcohol use: No  . Drug use: No  . Sexual activity: Not on file  Lifestyle  . Physical activity:    Days per week: Not on  file    Minutes per session: Not on file  . Stress: Not on file  Relationships  . Social connections:    Talks on phone: Not on file    Gets together: Not on file    Attends religious service: Not on file    Active member of club or organization: Not on file    Attends meetings of clubs or organizations: Not on file    Relationship status: Not on file  . Intimate partner violence:    Fear of current or ex partner: Not on file    Emotionally abused: Not on file    Physically abused: Not on file    Forced sexual activity: Not on file  Other Topics Concern  . Not on file  Social History Narrative  . Not on file    Past Surgical History:  Procedure Laterality Date  . FOOT SURGERY    . LOOP RECORDER INSERTION N/A 04/13/2017   Procedure: Loop Recorder Insertion;  Surgeon: Marinus Mawaylor, Gregg W, MD;  Location: [facility];  Service: Cardiovascular;  Laterality: N/A;  . TEE WITHOUT CARDIOVERSION N/A 04/13/2017   Procedure: TRANSESOPHAGEAL ECHOCARDIOGRAM (TEE);  Surgeon: Jake BatheSkains, Mark C, MD;  [facility];  Service: Cardiovascular;  Laterality: N/A;    There is no immunization history on  file for this patient.  Current Meds  Medication Sig  . aspirin 81 MG EC tablet Take 1 tablet (81 mg total) by mouth daily.  Marland Kitchen atorvastatin (LIPITOR) 80 MG tablet TAKE 1 TABLET BY MOUTH DAILY AT 6 PM.    No Known Allergies  Ht 5\' 6"  (1.676 m)   Wt 137 lb (62.1 kg)   BMI 22.11 kg/m    Review of Systems  Constitutional: Negative.   HENT: Negative.   Eyes: Negative.   Respiratory: Negative.   Cardiovascular: Negative.   Gastrointestinal: Positive for constipation.  Endocrine: Negative.   Genitourinary: Negative.   Musculoskeletal: Negative.   Skin: Negative.   Allergic/Immunologic: Negative.   Neurological: Positive for weakness (right sided / both legs effective).  Hematological: Negative.   Psychiatric/Behavioral: Negative.    Objective:   Physical Exam  Constitutional:  He is oriented to person, place, and time. He appears well-developed and well-nourished.  HENT:  Head: Normocephalic and atraumatic.  Right Ear: External ear normal.  Left Ear: External ear normal.  Nose: Nose normal.  Mouth/Throat: Oropharynx is clear and moist.  Eyes: Pupils are equal, round, and reactive to light. Conjunctivae and EOM are normal.  Neck: Normal range of motion. Neck supple.  Cardiovascular: Normal rate, regular rhythm, normal heart sounds and intact distal pulses.  Pulmonary/Chest: Effort normal and breath sounds normal.  Abdominal: Soft. Bowel sounds are normal.  Musculoskeletal: Normal range of motion.  Neurological: He is alert and oriented to person, place, and time.  Skin: Skin is warm and dry. Capillary refill takes less than 2 seconds.  Psychiatric: He has a normal mood and affect. His behavior is normal. Judgment and thought content normal.  Nursing note and vitals reviewed.  Assessment & Plan:   1. S/P stroke due to cerebrovascular disease He is doing well. He continues to have right sided and left leg weakness since stroke. He uses a cane for aide in ambulation.   2. Screening for diabetes mellitus Hgb A1c is in normal range at 5.3 today.  He will continue to decrease foods/beverages high in sugars and carbs and follow Heart Healthy or DASH diet. Increase physical activity to at least 30 minutes cardio exercise daily.   - POCT glycosylated hemoglobin (Hb A1C) - POCT urinalysis dipstick  3. Constipation, unspecified constipation type - docusate sodium (COLACE) 50 MG capsule; Take 1 capsule (50 mg total) by mouth daily.  Dispense: 30 capsule; Refill: 2  4. Tobacco use He continues to smoke less than 1/2 pack a day. He will contact office when he wants to began smoking cessation.   5. Anxiety - busPIRone (BUSPAR) 5 MG tablet; Take 1 tablet (5 mg total) by mouth 2 (two) times daily.  Dispense: 60 tablet; Refill: 1  6. Urinary tract infection without  hematuria, site unspecified - sulfamethoxazole-trimethoprim (BACTRIM DS,SEPTRA DS) 800-160 MG tablet; Take 1 tablet by mouth 2 (two) times daily.  Dispense: 14 tablet; Refill: 0  7. Abnormal urinalysis - Urine Culture  8. Follow up He will follow up in 6 weeks.   Raliegh Ip,  MSN, [name]-C [facility] [address] [place], [place] [address] [phone]

## 2018-05-30 NOTE — Patient Instructions (Signed)
Docusate capsules What is this medicine? DOCUSATE (doc CUE sayt) is stool softener. It helps prevent constipation and straining or discomfort associated with hard or dry stools. This medicine may be used for other purposes; ask your health care provider or pharmacist if you have questions. COMMON BRAND NAME(S): Colace, Colace Clear, Correctol, D.O.S., DC, Doc-Q-Lace, DocuLace, Docusoft S, DOK, DOK Extra Strength, Dulcolax, Genasoft, Kao-Tin, Kaopectate Liqui-Gels, Phillips Stool Softener, Stool Softener, Stool Softner DC, Sulfolax, Sur-Q-Lax, Surfak, Uni-Ease What should I tell my health care provider before I take this medicine? They need to know if you have any of these conditions: -nausea or vomiting -severe constipation -stomach pain -sudden change in bowel habit lasting more than 2 weeks -an unusual or allergic reaction to docusate, other medicines, foods, dyes, or preservatives -pregnant or trying to get pregnant -breast-feeding How should I use this medicine? Take this medicine by mouth with a glass of water. Follow the directions on the label. Take your doses at regular intervals. Do not take your medicine more often than directed. Talk to your pediatrician regarding the use of this medicine in children. While this medicine may be prescribed for children as young as 2 years for selected conditions, precautions do apply. Overdosage: If you think you have taken too much of this medicine contact a poison control center or emergency room at once. NOTE: This medicine is only for you. Do not share this medicine with others. What if I miss a dose? If you miss a dose, take it as soon as you can. If it is almost time for your next dose, take only that dose. Do not take double or extra doses. What may interact with this medicine? -mineral oil This list may not describe all possible interactions. Give your health care provider a list of all the medicines, herbs, non-prescription drugs, or dietary  supplements you use. Also tell them if you smoke, drink alcohol, or use illegal drugs. Some items may interact with your medicine. What should I watch for while using this medicine? Do not use for more than one week without advice from your doctor or health care professional. If your constipation returns, check with your doctor or health care professional. Drink plenty of water while taking this medicine. Drinking water helps decrease constipation. Stop using this medicine and contact your doctor or health care professional if you experience any rectal bleeding or do not have a bowel movement after use. These could be signs of a more serious condition. What side effects may I notice from receiving this medicine? Side effects that you should report to your doctor or health care professional as soon as possible: -allergic reactions like skin rash, itching or hives, swelling of the face, lips, or tongue Side effects that usually do not require medical attention (report to your doctor or health care professional if they continue or are bothersome): -diarrhea -stomach cramps -throat irritation This list may not describe all possible side effects. Call your doctor for medical advice about side effects. You may report side effects to FDA at 1-800-FDA-1088. Where should I keep my medicine? Keep out of the reach of children. Store at room temperature between 15 and 30 degrees C (59 and 86 degrees F). Throw away any unused medicine after the expiration date. NOTE: This sheet is a summary. It may not cover all possible information. If you have questions about this medicine, talk to your doctor, pharmacist, or health care provider.  2018 Elsevier/Gold Standard (2008-02-08 15:56:49) Buspirone tablets What is this medicine?  BUSPIRONE (byoo SPYE rone) is used to treat anxiety disorders. This medicine may be used for other purposes; ask your health care provider or pharmacist if you have questions. COMMON  BRAND NAME(S): BuSpar What should I tell my health care provider before I take this medicine? They need to know if you have any of these conditions: -kidney or liver disease -an unusual or allergic reaction to buspirone, other medicines, foods, dyes, or preservatives -pregnant or trying to get pregnant -breast-feeding How should I use this medicine? Take this medicine by mouth with a glass of water. Follow the directions on the prescription label. You may take this medicine with or without food. To ensure that this medicine always works the same way for you, you should take it either always with or always without food. Take your doses at regular intervals. Do not take your medicine more often than directed. Do not stop taking except on the advice of your doctor or health care professional. Talk to your pediatrician regarding the use of this medicine in children. Special care may be needed. Overdosage: If you think you have taken too much of this medicine contact a poison control center or emergency room at once. NOTE: This medicine is only for you. Do not share this medicine with others. What if I miss a dose? If you miss a dose, take it as soon as you can. If it is almost time for your next dose, take only that dose. Do not take double or extra doses. What may interact with this medicine? Do not take this medicine with any of the following medications: -linezolid -MAOIs like Carbex, Eldepryl, Marplan, Nardil, and Parnate -methylene blue -procarbazine This medicine may also interact with the following medications: -diazepam -digoxin -diltiazem -erythromycin -grapefruit juice -haloperidol -medicines for mental depression or mood problems -medicines for seizures like carbamazepine, phenobarbital and phenytoin -nefazodone -other medications for anxiety -rifampin -ritonavir -some antifungal medicines like itraconazole, ketoconazole, and voriconazole -verapamil -warfarin This list may  not describe all possible interactions. Give your health care provider a list of all the medicines, herbs, non-prescription drugs, or dietary supplements you use. Also tell them if you smoke, drink alcohol, or use illegal drugs. Some items may interact with your medicine. What should I watch for while using this medicine? Visit your doctor or health care professional for regular checks on your progress. It may take 1 to 2 weeks before your anxiety gets better. You may get drowsy or dizzy. Do not drive, use machinery, or do anything that needs mental alertness until you know how this drug affects you. Do not stand or sit up quickly, especially if you are an older patient. This reduces the risk of dizzy or fainting spells. Alcohol can make you more drowsy and dizzy. Avoid alcoholic drinks. What side effects may I notice from receiving this medicine? Side effects that you should report to your doctor or health care professional as soon as possible: -blurred vision or other vision changes -chest pain -confusion -difficulty breathing -feelings of hostility or anger -muscle aches and pains -numbness or tingling in hands or feet -ringing in the ears -skin rash and itching -vomiting -weakness Side effects that usually do not require medical attention (report to your doctor or health care professional if they continue or are bothersome): -disturbed dreams, nightmares -headache -nausea -restlessness or nervousness -sore throat and nasal congestion -stomach upset This list may not describe all possible side effects. Call your doctor for medical advice about side effects. You may report side  effects to FDA at 1-800-FDA-1088. Where should I keep my medicine? Keep out of the reach of children. Store at room temperature below 30 degrees C (86 degrees F). Protect from light. Keep container tightly closed. Throw away any unused medicine after the expiration date. NOTE: This sheet is a summary. It may not  cover all possible information. If you have questions about this medicine, talk to your doctor, pharmacist, or health care provider.  2018 Elsevier/Gold Standard (2010-05-28 18:06:11)

## 2018-06-01 LAB — URINE CULTURE: Organism ID, Bacteria: NO GROWTH

## 2018-06-26 ENCOUNTER — Ambulatory Visit (INDEPENDENT_AMBULATORY_CARE_PROVIDER_SITE_OTHER): Payer: Medicaid Other | Admitting: *Deleted

## 2018-06-26 DIAGNOSIS — I6310 Cerebral infarction due to embolism of unspecified precerebral artery: Secondary | ICD-10-CM | POA: Diagnosis not present

## 2018-06-26 NOTE — Progress Notes (Signed)
Carelink Summary Report / Loop Recorder 

## 2018-06-28 MED FILL — busPIRone HCL 5 MG TABS: 5 | 30 days supply | Qty: 60 | Fill #1

## 2018-06-28 MED FILL — ATORVASTATIN 80 MG TABLET: 80 | 30 days supply | Qty: 30 | Fill #4

## 2018-07-10 LAB — CUP PACEART REMOTE DEVICE CHECK
Date Time Interrogation Session: 20190724121150
Implantable Pulse Generator Implant Date: 20180613

## 2018-07-11 ENCOUNTER — Encounter: Payer: Self-pay | Admitting: Family Medicine

## 2018-07-11 ENCOUNTER — Other Ambulatory Visit: Payer: Self-pay

## 2018-07-11 ENCOUNTER — Ambulatory Visit (INDEPENDENT_AMBULATORY_CARE_PROVIDER_SITE_OTHER): Payer: Medicaid Other | Admitting: Family Medicine

## 2018-07-11 VITALS — BP 116/80 | HR 80 | Temp 98.1°F | Ht 66.0 in | Wt 136.0 lb

## 2018-07-11 DIAGNOSIS — Z09 Encounter for follow-up examination after completed treatment for conditions other than malignant neoplasm: Secondary | ICD-10-CM | POA: Diagnosis not present

## 2018-07-11 DIAGNOSIS — Z720 Tobacco use: Secondary | ICD-10-CM

## 2018-07-11 DIAGNOSIS — Z716 Tobacco abuse counseling: Secondary | ICD-10-CM

## 2018-07-11 DIAGNOSIS — N390 Urinary tract infection, site not specified: Secondary | ICD-10-CM

## 2018-07-11 DIAGNOSIS — G6289 Other specified polyneuropathies: Secondary | ICD-10-CM

## 2018-07-11 DIAGNOSIS — Z8673 Personal history of transient ischemic attack (TIA), and cerebral infarction without residual deficits: Secondary | ICD-10-CM | POA: Diagnosis not present

## 2018-07-11 DIAGNOSIS — F419 Anxiety disorder, unspecified: Secondary | ICD-10-CM | POA: Diagnosis not present

## 2018-07-11 DIAGNOSIS — R8290 Unspecified abnormal findings in urine: Secondary | ICD-10-CM

## 2018-07-11 LAB — POCT URINALYSIS DIP (MANUAL ENTRY)
Bilirubin, UA: NEGATIVE
Blood, UA: NEGATIVE
Glucose, UA: NEGATIVE mg/dL
Ketones, POC UA: NEGATIVE mg/dL
Nitrite, UA: NEGATIVE
Protein Ur, POC: NEGATIVE mg/dL
Spec Grav, UA: 1.01 (ref 1.010–1.025)
Urobilinogen, UA: 0.2 E.U./dL
pH, UA: 5 (ref 5.0–8.0)

## 2018-07-11 MED ORDER — SULFAMETHOXAZOLE-TRIMETHOPRIM 800-160 MG PO TABS: 1.0000 | ORAL_TABLET | Freq: Two times a day (BID) | ORAL | 0 refills | Status: DC

## 2018-07-11 MED ORDER — GABAPENTIN 100 MG PO CAPS: 100.0000 mg | ORAL_CAPSULE | Freq: Three times a day (TID) | ORAL | 3 refills | Status: DC

## 2018-07-11 MED FILL — GABAPENTIN 100 MG CAPSULE: 100 | 30 days supply | Qty: 90 | Fill #0

## 2018-07-11 MED FILL — SULFAMETHOXAZOLE-TMP DS TAB: 800-160 | 7 days supply | Qty: 14 | Fill #0

## 2018-07-11 NOTE — Telephone Encounter (Signed)
-----   Message from Kallie Locks, [name] sent at 07/11/2018 10:50 AM EDT ----- Regarding: "UIT" Lyla Son,   Please inform patient that I have also called in Rx for Septra for UTI.    Thanks!

## 2018-07-11 NOTE — Patient Instructions (Signed)
Gabapentin capsules or tablets What is this medicine? GABAPENTIN (GA ba pen tin) is used to control partial seizures in adults with epilepsy. It is also used to treat certain types of nerve pain. This medicine may be used for other purposes; ask your health care provider or pharmacist if you have questions. COMMON BRAND NAME(S): Active-PAC with Gabapentin, Gabarone, Neurontin What should I tell my health care provider before I take this medicine? They need to know if you have any of these conditions: -kidney disease -suicidal thoughts, plans, or attempt; a previous suicide attempt by you or a family member -an unusual or allergic reaction to gabapentin, other medicines, foods, dyes, or preservatives -pregnant or trying to get pregnant -breast-feeding How should I use this medicine? Take this medicine by mouth with a glass of water. Follow the directions on the prescription label. You can take it with or without food. If it upsets your stomach, take it with food.Take your medicine at regular intervals. Do not take it more often than directed. Do not stop taking except on your doctor's advice. If you are directed to break the 600 or 800 mg tablets in half as part of your dose, the extra half tablet should be used for the next dose. If you have not used the extra half tablet within 28 days, it should be thrown away. A special MedGuide will be given to you by the pharmacist with each prescription and refill. Be sure to read this information carefully each time. Talk to your pediatrician regarding the use of this medicine in children. Special care may be needed. Overdosage: If you think you have taken too much of this medicine contact a poison control center or emergency room at once. NOTE: This medicine is only for you. Do not share this medicine with others. What if I miss a dose? If you miss a dose, take it as soon as you can. If it is almost time for your next dose, take only that dose. Do not  take double or extra doses. What may interact with this medicine? Do not take this medicine with any of the following medications: -other gabapentin products This medicine may also interact with the following medications: -alcohol -antacids -antihistamines for allergy, cough and cold -certain medicines for anxiety or sleep -certain medicines for depression or psychotic disturbances -homatropine; hydrocodone -naproxen -narcotic medicines (opiates) for pain -phenothiazines like chlorpromazine, mesoridazine, prochlorperazine, thioridazine This list may not describe all possible interactions. Give your health care provider a list of all the medicines, herbs, non-prescription drugs, or dietary supplements you use. Also tell them if you smoke, drink alcohol, or use illegal drugs. Some items may interact with your medicine. What should I watch for while using this medicine? Visit your doctor or health care professional for regular checks on your progress. You may want to keep a record at home of how you feel your condition is responding to treatment. You may want to share this information with your doctor or health care professional at each visit. You should contact your doctor or health care professional if your seizures get worse or if you have any new types of seizures. Do not stop taking this medicine or any of your seizure medicines unless instructed by your doctor or health care professional. Stopping your medicine suddenly can increase your seizures or their severity. Wear a medical identification bracelet or chain if you are taking this medicine for seizures, and carry a card that lists all your medications. You may get drowsy, dizzy,   or have blurred vision. Do not drive, use machinery, or do anything that needs mental alertness until you know how this medicine affects you. To reduce dizzy or fainting spells, do not sit or stand up quickly, especially if you are an older patient. Alcohol can  increase drowsiness and dizziness. Avoid alcoholic drinks. Your mouth may get dry. Chewing sugarless gum or sucking hard candy, and drinking plenty of water will help. The use of this medicine may increase the chance of suicidal thoughts or actions. Pay special attention to how you are responding while on this medicine. Any worsening of mood, or thoughts of suicide or dying should be reported to your health care professional right away. Women who become pregnant while using this medicine may enroll in the [place] Antiepileptic Drug Pregnancy Registry by calling 1-888-233-2334. This registry collects information about the safety of antiepileptic drug use during pregnancy. What side effects may I notice from receiving this medicine? Side effects that you should report to your doctor or health care professional as soon as possible: -allergic reactions like skin rash, itching or hives, swelling of the face, lips, or tongue -worsening of mood, thoughts or actions of suicide or dying Side effects that usually do not require medical attention (report to your doctor or health care professional if they continue or are bothersome): -constipation -difficulty walking or controlling muscle movements -dizziness -nausea -slurred speech -tiredness -tremors -weight gain This list may not describe all possible side effects. Call your doctor for medical advice about side effects. You may report side effects to FDA at 1-800-FDA-1088. Where should I keep my medicine? Keep out of reach of children. This medicine may cause accidental overdose and death if it taken by other adults, children, or pets. Mix any unused medicine with a substance like cat litter or coffee grounds. Then throw the medicine away in a sealed container like a sealed bag or a coffee can with a lid. Do not use the medicine after the expiration date. Store at room temperature between 15 and 30 degrees C (59 and 86 degrees F). NOTE: This  sheet is a summary. It may not cover all possible information. If you have questions about this medicine, talk to your doctor, pharmacist, or health care provider.  2018 Elsevier/Gold Standard (2013-12-14 15:26:50)  

## 2018-07-11 NOTE — Telephone Encounter (Signed)
Patient notified

## 2018-07-11 NOTE — Progress Notes (Signed)
Follow Up  Subjective:    Patient ID: Daniel Stuart, male    DOB: 1966/02/01, 52 y.o.   MRN: [id number]   Chief Complaint  Patient presents with  . Follow-up    chronic conditon     HPI  Daniel Stuart is a 52 year old male with a past medical history of Stroke, Anxiety, and Tobacco Use. He is here today for follow up.  Current Status: Since his last office visit, he is doing well with no complaints. He is s/p: Stroke on 04/11/2017. He reports right sided weakness. He did not complete rehab because of transportation issues. He reports left lower extremity pain/numbness especially at rest. He currently ambulates with the assistance of a cane. He continues to smoke 1/2pack of cigarettes daily. He denies visual changes, chest pain, shortness of breath, heart palpitations, and falls. He has occasionally headaches and dizziness with position changes. Denies severe headaches, confusion, seizures, double vision, and blurred vision, nausea and vomiting.  He denies fevers, chills, fatigue, recent infections, weight loss, and night sweats. No reports of GI problems such as diarrhea, and constipation. He has no reports of blood in stools, dysuria and hematuria. No depression or anxiety, and denies suicidal ideations, homicidal ideations, or auditory hallucinations. He denies pain today.   Past Medical History:  Diagnosis Date  . Anxiety   . Stroke (cerebrum) (HCC)   . Tobacco use     Family History  Problem Relation Age of Onset  . Diabetes Mother   . Diabetes Father   . Heart failure Father     Social History   Socioeconomic History  . Marital status: Legally Separated    Spouse name: Not on file  . Number of children: Not on file  . Years of education: Not on file  . Highest education level: Not on file  Occupational History  . Not on file  Social Needs  . Financial resource strain: Not on file  . Food insecurity:    Worry: Not on file    Inability: Not on file  .  Transportation needs:    Medical: Not on file    Non-medical: Not on file  Tobacco Use  . Smoking status: Current Every Day Smoker    Packs/day: 1.00    Types: Cigarettes  . Smokeless tobacco: Never Used  Substance and Sexual Activity  . Alcohol use: No  . Drug use: No  . Sexual activity: Not on file  Lifestyle  . Physical activity:    Days per week: Not on file    Minutes per session: Not on file  . Stress: Not on file  Relationships  . Social connections:    Talks on phone: Not on file    Gets together: Not on file    Attends religious service: Not on file    Active member of club or organization: Not on file    Attends meetings of clubs or organizations: Not on file    Relationship status: Not on file  . Intimate partner violence:    Fear of current or ex partner: Not on file    Emotionally abused: Not on file    Physically abused: Not on file    Forced sexual activity: Not on file  Other Topics Concern  . Not on file  Social History Narrative  . Not on file    Past Surgical History:  Procedure Laterality Date  . FOOT SURGERY    . LOOP RECORDER INSERTION N/A 04/13/2017  Procedure: Loop Recorder Insertion;  Surgeon: Marinus Maw, MD;  Location: [facility];  Service: Cardiovascular;  Laterality: N/A;  . TEE WITHOUT CARDIOVERSION N/A 04/13/2017   Procedure: TRANSESOPHAGEAL ECHOCARDIOGRAM (TEE);  Surgeon: Jake Bathe, MD;  Location: [facility] ENDOSCOPY;  Service: Cardiovascular;  Laterality: N/A;     There is no immunization history on file for this patient.  Current Meds  Medication Sig  . aspirin 81 MG EC tablet Take 1 tablet (81 mg total) by mouth daily.  Marland Kitchen atorvastatin (LIPITOR) 80 MG tablet TAKE 1 TABLET BY MOUTH DAILY AT 6 PM.  . busPIRone (BUSPAR) 5 MG tablet Take 1 tablet (5 mg total) by mouth 2 (two) times daily.  Marland Kitchen docusate sodium (COLACE) 50 MG capsule Take 1 capsule (50 mg total) by mouth daily.    No Known Allergies  BP 116/80 (BP Location:  Left Arm)   Pulse 80   Temp 98.1 F (36.7 C) (Oral)   Ht 5\' 6"  (1.676 m)   Wt 136 lb (61.7 kg)   SpO2 100%   BMI 21.95 kg/m   Review of Systems  Constitutional: Negative.   HENT: Negative.   Eyes: Negative.   Respiratory: Negative.   Cardiovascular: Negative.   Gastrointestinal: Negative.   Endocrine: Negative.   Genitourinary: Negative.   Musculoskeletal: Negative.   Skin: Negative.   Allergic/Immunologic: Negative.   Neurological: Positive for weakness (right extremity).  Hematological: Negative.   Psychiatric/Behavioral: Negative.    Objective:   Physical Exam  Constitutional: He is oriented to person, place, and time. He appears well-developed and well-nourished.  HENT:  Head: Normocephalic and atraumatic.  Right Ear: External ear normal.  Left Ear: External ear normal.  Nose: Nose normal.  Mouth/Throat: Oropharynx is clear and moist.  Eyes: Pupils are equal, round, and reactive to light. Conjunctivae and EOM are normal.  Neck: Normal range of motion. Neck supple.  Cardiovascular: Normal rate, regular rhythm, normal heart sounds and intact distal pulses.  Pulmonary/Chest: Effort normal and breath sounds normal.  Abdominal: Soft. Bowel sounds are normal.  Musculoskeletal:  Residual right extremity weakness, r/t Stroke  Neurological: He is alert and oriented to person, place, and time.  Skin: Skin is warm and dry. Capillary refill takes less than 2 seconds.  Psychiatric: He has a normal mood and affect. His behavior is normal. Judgment and thought content normal.  Nursing note and vitals reviewed.  Assessment & Plan:   1. S/P stroke due to cerebrovascular disease Stable today. Uses cane to assist with ambulation. Patient was lost to follow up with rehab, because of transportation issues. He is beginning to have more right sided weakness difficulties upon ambulation and would like to resume rehab txs. We will refer him to rehab today. He will continue to decrease  high sodium intake, excessive alcohol intake, increase potassium intake, smoking cessation, and increase physical activity of at least 30 minutes of cardio activity daily. He will continue to follow Heart Healthy or DASH diet.  2. Other polyneuropathy Lower extremity numbness/tingling/pain. We will initiate Gabapentin 100 mg TID today.   3. Tobacco use He continues to smoke 1/2 pack of cigarettes daily.   4. Encounter for smoking cessation counseling Patient is not ready to quit smoking today. He will contact office if needs further info.   5. Anxiety Stable today. Continue Buspar as prescribed.   6. Follow up He will follow up in 3 months.  - POCT urinalysis dipstick  Meds ordered this encounter  Medications  .  gabapentin (NEURONTIN) 100 MG capsule    Sig: Take 1 capsule (100 mg total) by mouth 3 (three) times daily.    Dispense:  90 capsule    Refill:  3    Referral Orders     Ambulatory Referral to Neuro Rehab  Raliegh Ip,  MSN, [name]-C [facility] [address] [place], [place] [address] [phone]

## 2018-07-11 NOTE — Telephone Encounter (Signed)
Medications sent to [facility] per patient

## 2018-07-13 LAB — URINE CULTURE: Organism ID, Bacteria: NO GROWTH

## 2018-07-25 LAB — CUP PACEART REMOTE DEVICE CHECK
Implantable Pulse Generator Implant Date: 20180613
MDC IDC SESS DTM: 20190826124159

## 2018-07-31 ENCOUNTER — Ambulatory Visit (INDEPENDENT_AMBULATORY_CARE_PROVIDER_SITE_OTHER): Payer: Medicaid Other | Admitting: *Deleted

## 2018-07-31 DIAGNOSIS — I6310 Cerebral infarction due to embolism of unspecified precerebral artery: Secondary | ICD-10-CM | POA: Diagnosis not present

## 2018-07-31 NOTE — Progress Notes (Signed)
Carelink Summary Report / Loop Recorder 

## 2018-08-01 LAB — CUP PACEART REMOTE DEVICE CHECK
Implantable Pulse Generator Implant Date: 20180613
MDC IDC SESS DTM: 20190928133600

## 2018-08-07 MED FILL — ATORVASTATIN 80 MG TABLET: 80 | 30 days supply | Qty: 30 | Fill #5

## 2018-08-22 MED FILL — GABAPENTIN 100 MG CAPSULE: 100 | 30 days supply | Qty: 90 | Fill #1

## 2018-08-31 ENCOUNTER — Ambulatory Visit (INDEPENDENT_AMBULATORY_CARE_PROVIDER_SITE_OTHER): Payer: Medicaid Other | Admitting: *Deleted

## 2018-08-31 DIAGNOSIS — I6310 Cerebral infarction due to embolism of unspecified precerebral artery: Secondary | ICD-10-CM | POA: Diagnosis not present

## 2018-09-01 NOTE — Progress Notes (Signed)
Carelink Summary Report / Loop Recorder 

## 2018-09-06 ENCOUNTER — Other Ambulatory Visit: Payer: Self-pay | Admitting: Family Medicine

## 2018-09-06 NOTE — Telephone Encounter (Signed)
Requested medication (s) are due for refill today: Yes  Requested medication (s) are on the active medication list: Yes  Last refill:  01/19/18  Future visit scheduled: No  Notes to clinic:  See request    Requested Prescriptions  Pending Prescriptions Disp Refills   atorvastatin (LIPITOR) 80 MG tablet [Pharmacy Med Name: ATORVASTATIN 80 MG TABLET 80 TAB] 90 tablet 1    Sig: TAKE 1 TABLET BY MOUTH DAILY AT 6 PM.     There is no refill protocol information for this order

## 2018-09-13 ENCOUNTER — Other Ambulatory Visit: Payer: Self-pay

## 2018-09-13 MED ORDER — ATORVASTATIN CALCIUM 80 MG PO TABS: ORAL_TABLET | ORAL | 1 refills | Status: DC

## 2018-09-13 MED FILL — ATORVASTATIN 80 MG TABLET: 80 | 30 days supply | Qty: 30 | Fill #0

## 2018-09-13 NOTE — Telephone Encounter (Signed)
Medication sent to pharmacy  

## 2018-09-21 MED FILL — GABAPENTIN 100 MG CAPSULE: 100 | 30 days supply | Qty: 90 | Fill #2

## 2018-09-22 LAB — CUP PACEART REMOTE DEVICE CHECK
Date Time Interrogation Session: 20191031144009
Implantable Pulse Generator Implant Date: 20180613

## 2018-10-03 ENCOUNTER — Ambulatory Visit (INDEPENDENT_AMBULATORY_CARE_PROVIDER_SITE_OTHER): Payer: Medicaid Other

## 2018-10-03 DIAGNOSIS — I6310 Cerebral infarction due to embolism of unspecified precerebral artery: Secondary | ICD-10-CM | POA: Diagnosis not present

## 2018-10-04 NOTE — Progress Notes (Signed)
Carelink Summary Report / Loop Recorder 

## 2018-10-10 ENCOUNTER — Ambulatory Visit (INDEPENDENT_AMBULATORY_CARE_PROVIDER_SITE_OTHER): Payer: Medicaid Other | Admitting: Family Medicine

## 2018-10-10 ENCOUNTER — Encounter: Payer: Self-pay | Admitting: Family Medicine

## 2018-10-10 VITALS — BP 134/90 | HR 100 | Temp 98.0°F | Ht 66.0 in | Wt 139.0 lb

## 2018-10-10 DIAGNOSIS — R519 Headache, unspecified: Secondary | ICD-10-CM

## 2018-10-10 DIAGNOSIS — R51 Headache: Secondary | ICD-10-CM

## 2018-10-10 DIAGNOSIS — Z8673 Personal history of transient ischemic attack (TIA), and cerebral infarction without residual deficits: Secondary | ICD-10-CM | POA: Diagnosis not present

## 2018-10-10 DIAGNOSIS — Z09 Encounter for follow-up examination after completed treatment for conditions other than malignant neoplasm: Secondary | ICD-10-CM | POA: Diagnosis not present

## 2018-10-10 DIAGNOSIS — Z720 Tobacco use: Secondary | ICD-10-CM

## 2018-10-10 DIAGNOSIS — E785 Hyperlipidemia, unspecified: Secondary | ICD-10-CM

## 2018-10-10 LAB — POCT URINALYSIS DIP (MANUAL ENTRY)
Bilirubin, UA: NEGATIVE
Blood, UA: NEGATIVE
Glucose, UA: NEGATIVE mg/dL
Ketones, POC UA: NEGATIVE mg/dL
Leukocytes, UA: NEGATIVE
Nitrite, UA: NEGATIVE
Protein Ur, POC: NEGATIVE mg/dL
Spec Grav, UA: <=1.005 — AB (ref 1.010–1.025)
Urobilinogen, UA: 0.2 E.U./dL
pH, UA: 6 (ref 5.0–8.0)

## 2018-10-10 NOTE — Progress Notes (Signed)
Follow Up  Subjective:    Patient ID: Larina Earthlyimothy E Gasbarro, male    DOB: Jan 08, 1966, 52 y.o.   MRN: [id number]  Chief Complaint  Patient presents with  . Follow-up    Chronic condition   HPI  Mr. Roanna EpleyGarland is a 52 year old male with a past history of Tobacco Use, Stroke, and Anxiety. He is here today for follow.   Current Status: Since his last office visit, he is doing well with no complaints. He is s/p: Stroke. He has c/o severe frontal migraine headaches, with duration from 30 secs-1 minute. He that he has these pains approximately twice a week. He does not take medication from relief. He states that he has been having these headaches for 1 month now. He continues to smoke 1/2 pack cigarettes a daily. He denies fevers, chills, fatigue, recent infections, weight loss, and night sweats. He has not had any headaches, visual changes, dizziness, and falls. No chest pain, heart palpitations, cough and shortness of breath reported. No reports of GI problems such as nausea, vomiting, diarrhea, and constipation. He has no reports of blood in stools, dysuria and hematuria. No depression or anxiety reported. She denies pain today.   Review of Systems  Constitutional: Negative.   HENT: Negative.   Eyes: Negative.   Respiratory: Negative.   Cardiovascular: Negative.   Gastrointestinal: Negative.   Endocrine: Negative.   Genitourinary: Negative.   Musculoskeletal: Positive for gait problem (right sided weakness/ cane use).  Skin: Negative.   Neurological: Positive for weakness (right sided) and headaches (migraines).  Hematological: Negative.   Psychiatric/Behavioral: Negative.    Objective:   Physical Exam  Constitutional: He is oriented to person, place, and time. He appears well-developed and well-nourished.  HENT:  Head: Normocephalic and atraumatic.  Right Ear: External ear normal.  Eyes: Pupils are equal, round, and reactive to light. Conjunctivae and EOM are normal.  Neck: Normal range of  motion. Neck supple.  Cardiovascular: Normal rate, regular rhythm, normal heart sounds and intact distal pulses.  Pulmonary/Chest: Effort normal and breath sounds normal.  Abdominal: Soft. Bowel sounds are normal.  Musculoskeletal: Normal range of motion.  Neurological: He is alert and oriented to person, place, and time.  Skin: Skin is warm and dry.  Nursing note and vitals reviewed.  Assessment & Plan:   1. S/P stroke due to cerebrovascular disease He is doing well. He has recently began experiencing severe frontal headaches lasting from 30 secs-1 minute. He will continue to use cane for right-sided weakness.   2. Severe frontal headaches - Ambulatory referral to Neurology  3. Hyperlipidemia, unspecified hyperlipidemia type Continue Atorvastatin as prescribed.   4. Tobacco use Counseled patient on the benefits of quitting smoking.   5. Follow up He will follow up in 5 months.  - POCT urinalysis dipstick  No orders of the defined types were placed in this encounter.  Raliegh IpNatalie Maddy Graham,  MSN, [name]-C [facility] [address]  [place], [place] [id number] [phone]

## 2018-10-12 ENCOUNTER — Encounter: Payer: Self-pay | Admitting: Neurology

## 2018-10-20 MED FILL — GABAPENTIN 100 MG CAPSULE: 100 | 30 days supply | Qty: 90 | Fill #3

## 2018-10-20 MED FILL — ATORVASTATIN 80 MG TABLET: 80 | 30 days supply | Qty: 30 | Fill #1

## 2018-11-01 LAB — CUP PACEART REMOTE DEVICE CHECK
Date Time Interrogation Session: 20191203151048
Implantable Pulse Generator Implant Date: 20180613

## 2018-11-06 ENCOUNTER — Ambulatory Visit (INDEPENDENT_AMBULATORY_CARE_PROVIDER_SITE_OTHER): Payer: Medicaid Other

## 2018-11-06 DIAGNOSIS — I6310 Cerebral infarction due to embolism of unspecified precerebral artery: Secondary | ICD-10-CM

## 2018-11-06 LAB — CUP PACEART REMOTE DEVICE CHECK
Date Time Interrogation Session: 20200105164117
Implantable Pulse Generator Implant Date: 20180613

## 2018-11-07 NOTE — Progress Notes (Signed)
Carelink Summary Report / Loop Recorder 

## 2018-11-29 ENCOUNTER — Other Ambulatory Visit: Payer: Self-pay | Admitting: Family Medicine

## 2018-11-29 DIAGNOSIS — G6289 Other specified polyneuropathies: Secondary | ICD-10-CM

## 2018-11-30 ENCOUNTER — Other Ambulatory Visit: Payer: Self-pay

## 2018-11-30 ENCOUNTER — Other Ambulatory Visit: Payer: Self-pay | Admitting: Family Medicine

## 2018-11-30 DIAGNOSIS — G6289 Other specified polyneuropathies: Secondary | ICD-10-CM

## 2018-11-30 MED ORDER — GABAPENTIN 100 MG PO CAPS: 100.0000 mg | ORAL_CAPSULE | Freq: Three times a day (TID) | ORAL | 3 refills | Status: DC

## 2018-11-30 MED FILL — ATORVASTATIN 80 MG TABLET: 80 | 30 days supply | Qty: 30 | Fill #2

## 2018-11-30 MED FILL — GABAPENTIN 100 MG CAPSULE: 100 | 30 days supply | Qty: 90 | Fill #0

## 2018-11-30 NOTE — Telephone Encounter (Signed)
Medication sent to pharmacy  

## 2018-12-08 ENCOUNTER — Ambulatory Visit (INDEPENDENT_AMBULATORY_CARE_PROVIDER_SITE_OTHER): Payer: Medicaid Other

## 2018-12-08 DIAGNOSIS — I6310 Cerebral infarction due to embolism of unspecified precerebral artery: Secondary | ICD-10-CM

## 2018-12-09 LAB — CUP PACEART REMOTE DEVICE CHECK
Date Time Interrogation Session: 20200207163923
Implantable Pulse Generator Implant Date: 20180613
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
## 2018-12-18 ENCOUNTER — Encounter: Payer: Self-pay | Admitting: Neurology

## 2018-12-18 ENCOUNTER — Ambulatory Visit: Payer: Medicaid Other | Admitting: Neurology

## 2018-12-18 VITALS — BP 104/70 | HR 90 | Ht 66.0 in | Wt 139.0 lb

## 2018-12-18 DIAGNOSIS — F17200 Nicotine dependence, unspecified, uncomplicated: Secondary | ICD-10-CM

## 2018-12-18 DIAGNOSIS — G4485 Primary stabbing headache: Secondary | ICD-10-CM | POA: Diagnosis not present

## 2018-12-18 DIAGNOSIS — I6310 Cerebral infarction due to embolism of unspecified precerebral artery: Secondary | ICD-10-CM | POA: Diagnosis not present

## 2018-12-18 MED ORDER — GABAPENTIN 300 MG PO CAPS: ORAL_CAPSULE | ORAL | 0 refills | Status: DC

## 2018-12-18 MED FILL — GABAPENTIN 300 MG CAPSULE: 300 | 30 days supply | Qty: 60 | Fill #0

## 2018-12-18 NOTE — Patient Instructions (Signed)
1. Stop the 100mg  gabapentin capsule.  Start 300mg  capsule:    Take 1 at bedtime for one week, then 1 capsule twice daily  Towards end of prescription, contact me with update and we can continue or increase dose. 2.  Start melatonin 3mg  at bedtime (over the counter) 3.  Follow up in 4 months.

## 2018-12-19 NOTE — Progress Notes (Signed)
Carelink Summary Report / Loop Recorder 

## 2019-01-09 MED FILL — ATORVASTATIN 80 MG TABLET: 80 | 30 days supply | Qty: 30 | Fill #3

## 2019-01-10 ENCOUNTER — Ambulatory Visit (INDEPENDENT_AMBULATORY_CARE_PROVIDER_SITE_OTHER): Payer: Medicaid Other | Admitting: *Deleted

## 2019-01-10 DIAGNOSIS — I6310 Cerebral infarction due to embolism of unspecified precerebral artery: Secondary | ICD-10-CM | POA: Diagnosis not present

## 2019-01-11 LAB — CUP PACEART REMOTE DEVICE CHECK
Date Time Interrogation Session: 20200311181250
Implantable Pulse Generator Implant Date: 20180613

## 2019-01-17 NOTE — Progress Notes (Signed)
Carelink Summary Report / Loop Recorder 

## 2019-01-23 ENCOUNTER — Other Ambulatory Visit: Payer: Self-pay | Admitting: Neurology

## 2019-02-12 ENCOUNTER — Ambulatory Visit (INDEPENDENT_AMBULATORY_CARE_PROVIDER_SITE_OTHER): Payer: Medicaid Other | Admitting: *Deleted

## 2019-02-12 ENCOUNTER — Other Ambulatory Visit: Payer: Self-pay

## 2019-02-12 DIAGNOSIS — I639 Cerebral infarction, unspecified: Secondary | ICD-10-CM | POA: Diagnosis not present

## 2019-02-12 LAB — CUP PACEART REMOTE DEVICE CHECK
Date Time Interrogation Session: 20200413183537
Implantable Pulse Generator Implant Date: 20180613

## 2019-02-22 NOTE — Progress Notes (Signed)
Carelink Summary Report / Loop Recorder 

## 2019-02-28 MED FILL — GABAPENTIN 300 MG CAPSULE: 300 | 30 days supply | Qty: 60 | Fill #0

## 2019-02-28 MED FILL — ATORVASTATIN 80 MG TABLET: 80 | 30 days supply | Qty: 30 | Fill #4

## 2019-03-12 ENCOUNTER — Ambulatory Visit (INDEPENDENT_AMBULATORY_CARE_PROVIDER_SITE_OTHER): Payer: Medicaid Other | Admitting: Family Medicine

## 2019-03-12 ENCOUNTER — Other Ambulatory Visit: Payer: Self-pay

## 2019-03-12 ENCOUNTER — Encounter: Payer: Self-pay | Admitting: Family Medicine

## 2019-03-12 VITALS — BP 124/76 | HR 84 | Temp 98.2°F | Ht 66.0 in | Wt 143.0 lb

## 2019-03-12 DIAGNOSIS — Z8673 Personal history of transient ischemic attack (TIA), and cerebral infarction without residual deficits: Secondary | ICD-10-CM | POA: Insufficient documentation

## 2019-03-12 DIAGNOSIS — Z0000 Encounter for general adult medical examination without abnormal findings: Secondary | ICD-10-CM

## 2019-03-12 DIAGNOSIS — Z716 Tobacco abuse counseling: Secondary | ICD-10-CM | POA: Diagnosis not present

## 2019-03-12 DIAGNOSIS — F419 Anxiety disorder, unspecified: Secondary | ICD-10-CM

## 2019-03-12 DIAGNOSIS — Z09 Encounter for follow-up examination after completed treatment for conditions other than malignant neoplasm: Secondary | ICD-10-CM | POA: Diagnosis not present

## 2019-03-12 DIAGNOSIS — Z720 Tobacco use: Secondary | ICD-10-CM | POA: Diagnosis not present

## 2019-03-12 DIAGNOSIS — R531 Weakness: Secondary | ICD-10-CM | POA: Diagnosis not present

## 2019-03-12 DIAGNOSIS — N390 Urinary tract infection, site not specified: Secondary | ICD-10-CM

## 2019-03-12 LAB — POCT URINALYSIS DIP (MANUAL ENTRY)
Bilirubin, UA: NEGATIVE
Blood, UA: NEGATIVE
Glucose, UA: NEGATIVE mg/dL
Ketones, POC UA: NEGATIVE mg/dL
Nitrite, UA: NEGATIVE
Protein Ur, POC: NEGATIVE mg/dL
Spec Grav, UA: 1.02 (ref 1.010–1.025)
Urobilinogen, UA: 0.2 E.U./dL
pH, UA: 5.5 (ref 5.0–8.0)

## 2019-03-12 MED ORDER — SULFAMETHOXAZOLE-TRIMETHOPRIM 800-160 MG PO TABS: 1.0000 | ORAL_TABLET | Freq: Two times a day (BID) | ORAL | 0 refills | Status: DC

## 2019-03-12 MED FILL — SULFAMETHOXAZOLE-TMP DS TAB: 800-160 | 7 days supply | Qty: 14 | Fill #0

## 2019-03-12 NOTE — Progress Notes (Signed)
[facility]    Established Patient Office Visit  Subjective:  Patient ID: Daniel Stuart, male    DOB: 1966/04/10  Age: 53 y.o. MRN: [id number]  CC:  Chief Complaint  Patient presents with  . Follow-up    chronic condition   . Arm Pain    right     HPI Daniel Stuart is a 53 yea old male whopresents for follow up today.   Past Medical History:  Diagnosis Date  . Anxiety   . Stroke (cerebrum) (HCC)   . Tobacco use    Current Status: Since his last office visit, he is doing well with no complaints. He has c/o right arm pain X 1 week. He denies injury, and is currently taking Gabapentin for aide in decreasing pain. He had referral to Neurologist, which he was prescribed Gabapentin. He continues to have mild right-sided weakness. He uses cane for support. His anxiety is moderate today. His mother recently had stroke and is disabled. He is currently smoking 1 pack of cigarettes every other day.  He denies fevers, chills, fatigue, recent infections, weight loss, and night sweats. He has not had any headaches, visual changes, dizziness, and falls. No chest pain, heart palpitations, cough and shortness of breath reported. No reports of GI problems such as nausea, vomiting, diarrhea, and constipation. He has no reports of blood in stools, dysuria and hematuria.    Past Surgical History:  Procedure Laterality Date  . FOOT SURGERY    . LOOP RECORDER INSERTION N/A 04/13/2017   Procedure: Loop Recorder Insertion;  Surgeon: Marinus Mawaylor, Gregg W, MD;  Location: [facility];  Service: Cardiovascular;  Laterality: N/A;  . TEE WITHOUT CARDIOVERSION N/A 04/13/2017   Procedure: TRANSESOPHAGEAL ECHOCARDIOGRAM (TEE);  Surgeon: Jake BatheSkains, Mark C, MD;  [facility];  Service: Cardiovascular;  Laterality: N/A;    Family History  Problem Relation Age of Onset  . Diabetes Mother   . Diabetes Father   . Heart failure Father   . Cancer  Sister     Social History   Socioeconomic History  . Marital status: Legally Separated    Spouse name: Not on file  . Number of children: 1  . Years of education: Not on file  . Highest education level: Some college, no degree  Occupational History  . Occupation: disabled  Social Needs  . Financial resource strain: Not on file  . Food insecurity:    Worry: Not on file    Inability: Not on file  . Transportation needs:    Medical: Not on file    Non-medical: Not on file  Tobacco Use  . Smoking status: Current Every Day Smoker    Packs/day: 1.00    Types: Cigarettes  . Smokeless tobacco: Never Used  Substance and Sexual Activity  . Alcohol use: No  . Drug use: No  . Sexual activity: Not on file  Lifestyle  . Physical activity:    Days per week: Not on file    Minutes per session: Not on file  . Stress: Not on file  Relationships  . Social connections:    Talks on phone: Not on file    Gets together: Not on file    Attends religious service: Not on file    Active member of club or organization: Not on file    Attends meetings of clubs or organizations: Not on file    Relationship status: Not on file  .  Intimate partner violence:    Fear of current or ex partner: Not on file    Emotionally abused: Not on file    Physically abused: Not on file    Forced sexual activity: Not on file  Other Topics Concern  . Not on file  Social History Narrative   Patient is right-handed. He lives in a one level home with his mother since CVA. He drinks one cup of coffee and 2-3 sodas a day. He walks when weather permitting.    Outpatient Medications Prior to Visit  Medication Sig Dispense Refill  . aspirin 81 MG EC tablet Take 1 tablet (81 mg total) by mouth daily. 30 tablet 3  . atorvastatin (LIPITOR) 80 MG tablet TAKE 1 TABLET BY MOUTH DAILY AT 6 PM. 90 tablet 1  . gabapentin (NEURONTIN) 300 MG capsule 1 CAPSULE TWICE DAILY 60 capsule 1  . nicotine (NICODERM CQ) 21 mg/24hr patch  Place 1 patch (21 mg total) onto the skin daily. (Patient not taking: Reported on 07/11/2018) 28 patch 1   No facility-administered medications prior to visit.     No Known Allergies  ROS Review of Systems  Constitutional: Negative.   HENT: Negative.   Respiratory: Negative.   Cardiovascular: Negative.   Gastrointestinal: Negative.   Endocrine: Negative.   Genitourinary: Negative.   Musculoskeletal: Negative.   Skin: Negative.   Neurological: Positive for weakness (residual right-sided weakness r/t recent stroke. ), numbness (pain/numbness/tingling in right hand) and headaches.  Psychiatric/Behavioral: Negative.    Objective:    Physical Exam  Constitutional: He is oriented to person, place, and time. He appears well-nourished.  HENT:  Head: Normocephalic and atraumatic.  Eyes: Conjunctivae and EOM are normal.  Neck: Normal range of motion. Neck supple.  Cardiovascular: Normal rate, regular rhythm, normal heart sounds and intact distal pulses.  Pulmonary/Chest: Effort normal and breath sounds normal.  Musculoskeletal: Normal range of motion.  Neurological: He is alert and oriented to person, place, and time. He has normal reflexes.  Skin: Skin is warm.  Psychiatric: He has a normal mood and affect. His behavior is normal. Judgment and thought content normal.  Nursing note and vitals reviewed.   BP 124/76 (BP Location: Right Arm, Patient Position: Sitting, Cuff Size: Small)   Pulse 84   Temp 98.2 F (36.8 C) (Oral)   Ht 5\' 6"  (1.676 m)   Wt 143 lb (64.9 kg)   SpO2 99%   BMI 23.08 kg/m  Wt Readings from Last 3 Encounters:  03/12/19 143 lb (64.9 kg)  12/18/18 139 lb (63 kg)  10/10/18 139 lb (63 kg)     Health Maintenance Due  Topic Date Due  . Hepatitis C Screening  1966/04/12  . COLONOSCOPY  04/16/2014    There are no preventive care reminders to display for this patient.  Lab Results  Component Value Date   TSH 1.34 04/18/2017   Lab Results  Component  Value Date   WBC 10.3 04/18/2017   HGB 14.2 04/18/2017   HCT 43.4 04/18/2017   MCV 90.4 04/18/2017   PLT 265 04/18/2017   Lab Results  Component Value Date   NA 143 11/30/2017   K 4.2 11/30/2017   CO2 24 11/30/2017   GLUCOSE 111 (H) 11/30/2017   BUN 5 (L) 11/30/2017   CREATININE 1.10 11/30/2017   BILITOT 0.4 11/30/2017   ALKPHOS 148 (H) 11/30/2017   AST 19 11/30/2017   ALT 19 11/30/2017   PROT 7.9 11/30/2017   ALBUMIN 4.6  11/30/2017   CALCIUM 9.4 11/30/2017   ANIONGAP 10 04/13/2017   Lab Results  Component Value Date   CHOL 191 04/12/2017   Lab Results  Component Value Date   HDL 38 (L) 04/12/2017   Lab Results  Component Value Date   LDLCALC 138 (H) 04/12/2017   Lab Results  Component Value Date   TRIG 77 04/12/2017   Lab Results  Component Value Date   CHOLHDL 5.0 04/12/2017   Lab Results  Component Value Date   HGBA1C 5.3 05/30/2018      Assessment & Plan:   1. S/P stroke due to cerebrovascular disease Stable. No signs or symptoms of recurrence noted or reported.   2. Right sided weakness Stable. He had recent follow up with Neurologist. Continue to use cane as needed.   3. Tobacco use  4. Encounter for smoking cessation counseling Discussed benefits of quitting smoking today. Patient will contact office if needed.  5. Anxiety  6. Urinary tract infection without hematuria, site unspecified We will initiate Antibiotic today.  - sulfamethoxazole-trimethoprim (BACTRIM DS) 800-160 MG tablet; Take 1 tablet by mouth 2 (two) times daily.  Dispense: 14 tablet; Refill: 0  7. Healthcare maintenance - CBC with Differential - Comprehensive metabolic panel; Future - TSH - Lipid Panel - Vitamin D, 25-hydroxy - Vitamin B12  8. Follow up He will follow up in 6 months.  - POCT urinalysis dipstick  Meds ordered this encounter  Medications  . sulfamethoxazole-trimethoprim (BACTRIM DS) 800-160 MG tablet    Sig: Take 1 tablet by mouth 2 (two) times  daily.    Dispense:  14 tablet    Refill:  0    Orders Placed This Encounter  Procedures  . CBC with Differential  . Comprehensive metabolic panel  . TSH  . Lipid Panel  . Vitamin D, 25-hydroxy  . Vitamin B12  . POCT urinalysis dipstick    Referral Orders  No referral(s) requested today    Raliegh Ip,  MSN, [name]-C [facility] [address] [place], [place] [address] [phone]    Problem List Items Addressed This Visit    None    Visit Diagnoses    S/P stroke due to cerebrovascular disease    -  Primary   Right sided weakness       Tobacco use       Encounter for smoking cessation counseling       Anxiety       Urinary tract infection without hematuria, site unspecified       Relevant Medications   sulfamethoxazole-trimethoprim (BACTRIM DS) 800-160 MG tablet   Healthcare maintenance       Relevant Orders   CBC with Differential   Comprehensive metabolic panel   TSH   Lipid Panel   Vitamin D, 25-hydroxy   Vitamin B12   Follow up       Relevant Orders   POCT urinalysis dipstick (Completed)      Meds ordered this encounter  Medications  . sulfamethoxazole-trimethoprim (BACTRIM DS) 800-160 MG tablet    Sig: Take 1 tablet by mouth 2 (two) times daily.    Dispense:  14 tablet    Refill:  0    Follow-up: Return in about 6 months (around 09/12/2019).    Kallie Locks, [name]

## 2019-03-12 NOTE — Patient Instructions (Signed)
Sulfamethoxazole; Trimethoprim, SMX-TMP tablets What is this medicine? SULFAMETHOXAZOLE; TRIMETHOPRIM or SMX-TMP (suhl fuh meth OK suh zohl; trye METH oh prim) is a combination of a sulfonamide antibiotic and a second antibiotic, trimethoprim. It is used to treat or prevent certain kinds of bacterial infections. It will not work for colds, flu, or other viral infections. This medicine may be used for other purposes; ask your health care provider or pharmacist if you have questions. COMMON BRAND NAME(S): Bacter-Aid DS, Bactrim, Bactrim DS, Septra, Septra DS What should I tell my health care provider before I take this medicine? They need to know if you have any of these conditions: -anemia -asthma -being treated with anticonvulsants -if you frequently drink alcohol containing drinks -kidney disease -liver disease -low level of folic acid or glucose-6-phosphate dehydrogenase -poor nutrition or malabsorption -porphyria -severe allergies -thyroid disorder -an unusual or allergic reaction to sulfamethoxazole, trimethoprim, sulfa drugs, other medicines, foods, dyes, or preservatives -pregnant or trying to get pregnant -breast-feeding How should I use this medicine? Take this medicine by mouth with a full glass of water. Follow the directions on the prescription label. Take your medicine at regular intervals. Do not take it more often than directed. Do not skip doses or stop your medicine early. Talk to your pediatrician regarding the use of this medicine in children. Special care may be needed. This medicine has been used in children as young as 2 months of age. Overdosage: If you think you have taken too much of this medicine contact a poison control center or emergency room at once. NOTE: This medicine is only for you. Do not share this medicine with others. What if I miss a dose? If you miss a dose, take it as soon as you can. If it is almost time for your next dose, take only that dose. Do  not take double or extra doses. What may interact with this medicine? Do not take this medicine with any of the following medications: -aminobenzoate potassium -dofetilide -metronidazole This medicine may also interact with the following medications: -ACE inhibitors like benazepril, enalapril, lisinopril, and ramipril -birth control pills -cyclosporine -digoxin -diuretics -indomethacin -medicines for diabetes -methenamine -methotrexate -phenytoin -potassium supplements -pyrimethamine -sulfinpyrazone -tricyclic antidepressants -warfarin This list may not describe all possible interactions. Give your health care provider a list of all the medicines, herbs, non-prescription drugs, or dietary supplements you use. Also tell them if you smoke, drink alcohol, or use illegal drugs. Some items may interact with your medicine. What should I watch for while using this medicine? Tell your doctor or health care professional if your symptoms do not improve. Drink several glasses of water a day to reduce the risk of kidney problems. Do not treat diarrhea with over the counter products. Contact your doctor if you have diarrhea that lasts more than 2 days or if it is severe and watery. This medicine can make you more sensitive to the sun. Keep out of the sun. If you cannot avoid being in the sun, wear protective clothing and use a sunscreen. Do not use sun lamps or tanning beds/booths. What side effects may I notice from receiving this medicine? Side effects that you should report to your doctor or health care professional as soon as possible: -allergic reactions like skin rash or hives, swelling of the face, lips, or tongue -breathing problems -fever or chills, sore throat -irregular heartbeat, chest pain -joint or muscle pain -pain or difficulty passing urine -red pinpoint spots on skin -redness, blistering, peeling or loosening of   the skin, including inside the mouth -unusual bleeding or  bruising -unusually weak or tired -yellowing of the eyes or skin Side effects that usually do not require medical attention (report to your doctor or health care professional if they continue or are bothersome): -diarrhea -dizziness -headache -loss of appetite -nausea, vomiting -nervousness This list may not describe all possible side effects. Call your doctor for medical advice about side effects. You may report side effects to FDA at 1-800-FDA-1088. Where should I keep my medicine? Keep out of the reach of children. Store at room temperature between 20 to 25 degrees C (68 to 77 degrees F). Protect from light. Throw away any unused medicine after the expiration date. NOTE: This sheet is a summary. It may not cover all possible information. If you have questions about this medicine, talk to your doctor, pharmacist, or health care provider.  2019 Elsevier/Gold Standard (2013-05-25 14:38:26) Urinary Tract Infection, Adult A urinary tract infection (UTI) is an infection of any part of the urinary tract. The urinary tract includes:  The kidneys.  The ureters.  The bladder.  The urethra. These organs make, store, and get rid of pee (urine) in the body. What are the causes? This is caused by germs (bacteria) in your genital area. These germs grow and cause swelling (inflammation) of your urinary tract. What increases the risk? You are more likely to develop this condition if:  You have a small, thin tube (catheter) to drain pee.  You cannot control when you pee or poop (incontinence).  You are male, and: ? You use these methods to prevent pregnancy: ? A medicine that kills sperm (spermicide). ? A device that blocks sperm (diaphragm). ? You have low levels of a male hormone (estrogen). ? You are pregnant.  You have genes that add to your risk.  You are sexually active.  You take antibiotic medicines.  You have trouble peeing because of: ? A prostate that is bigger than  normal, if you are male. ? A blockage in the part of your body that drains pee from the bladder (urethra). ? A kidney stone. ? A nerve condition that affects your bladder (neurogenic bladder). ? Not getting enough to drink. ? Not peeing often enough.  You have other conditions, such as: ? Diabetes. ? A weak disease-fighting system (immune system). ? Sickle cell disease. ? Gout. ? Injury of the spine. What are the signs or symptoms? Symptoms of this condition include:  Needing to pee right away (urgently).  Peeing often.  Peeing small amounts often.  Pain or burning when peeing.  Blood in the pee.  Pee that smells bad or not like normal.  Trouble peeing.  Pee that is cloudy.  Fluid coming from the vagina, if you are male.  Pain in the belly or lower back. Other symptoms include:  Throwing up (vomiting).  No urge to eat.  Feeling mixed up (confused).  Being tired and grouchy (irritable).  A fever.  Watery poop (diarrhea). How is this treated? This condition may be treated with:  Antibiotic medicine.  Other medicines.  Drinking enough water. Follow these instructions at home:  Medicines  Take over-the-counter and prescription medicines only as told by your doctor.  If you were prescribed an antibiotic medicine, take it as told by your doctor. Do not stop taking it even if you start to feel better. General instructions  Make sure you: ? Pee until your bladder is empty. ? Do not hold pee for a long time. ?   Empty your bladder after sex. ? Wipe from front to back after pooping if you are a male. Use each tissue one time when you wipe.  Drink enough fluid to keep your pee pale yellow.  Keep all follow-up visits as told by your doctor. This is important. Contact a doctor if:  You do not get better after 1-2 days.  Your symptoms go away and then come back. Get help right away if:  You have very bad back pain.  You have very bad pain in  your lower belly.  You have a fever.  You are sick to your stomach (nauseous).  You are throwing up. Summary  A urinary tract infection (UTI) is an infection of any part of the urinary tract.  This condition is caused by germs in your genital area.  There are many risk factors for a UTI. These include having a small, thin tube to drain pee and not being able to control when you pee or poop.  Treatment includes antibiotic medicines for germs.  Drink enough fluid to keep your pee pale yellow. This information is not intended to replace advice given to you by your health care provider. Make sure you discuss any questions you have with your health care provider. Document Released: 04/05/2008 Document Revised: 04/27/2018 Document Reviewed: 04/27/2018 Elsevier Interactive Patient Education  2019 Elsevier [employer].  

## 2019-03-13 LAB — LIPID PANEL
Chol/HDL Ratio: 3 ratio (ref 0.0–5.0)
Cholesterol, Total: 112 mg/dL (ref 100–199)
HDL: 37 mg/dL — ABNORMAL LOW (ref >39)
LDL Calculated: 59 mg/dL (ref 0–99)
Triglycerides: 78 mg/dL (ref 0–149)
VLDL Cholesterol Cal: 16 mg/dL (ref 5–40)

## 2019-03-13 LAB — CBC WITH DIFFERENTIAL/PLATELET
Basophils Absolute: 0 10*3/uL (ref 0.0–0.2)
Basos: 1 %
EOS (ABSOLUTE): 0.1 10*3/uL (ref 0.0–0.4)
Eos: 2 %
Hematocrit: 34.9 % — ABNORMAL LOW (ref 37.5–51.0)
Hemoglobin: 12.3 g/dL — ABNORMAL LOW (ref 13.0–17.7)
Immature Grans (Abs): 0 10*3/uL (ref 0.0–0.1)
Immature Granulocytes: 0 %
Lymphocytes Absolute: 1.9 10*3/uL (ref 0.7–3.1)
Lymphs: 24 %
MCH: 36.3 pg — ABNORMAL HIGH (ref 26.6–33.0)
MCHC: 35.2 g/dL (ref 31.5–35.7)
MCV: 103 fL — ABNORMAL HIGH (ref 79–97)
Monocytes Absolute: 0.5 10*3/uL (ref 0.1–0.9)
Monocytes: 6 %
Neutrophils Absolute: 5.4 10*3/uL (ref 1.4–7.0)
Neutrophils: 67 %
Platelets: 240 10*3/uL (ref 150–450)
RBC: 3.39 x10E6/uL — ABNORMAL LOW (ref 4.14–5.80)
RDW: 14.8 % (ref 11.6–15.4)
WBC: 8 10*3/uL (ref 3.4–10.8)

## 2019-03-13 LAB — TSH: TSH: 1.77 u[IU]/mL (ref 0.450–4.500)

## 2019-03-13 LAB — VITAMIN D 25 HYDROXY (VIT D DEFICIENCY, FRACTURES): Vit D, 25-Hydroxy: 8 ng/mL — ABNORMAL LOW (ref 30.0–100.0)

## 2019-03-13 LAB — VITAMIN B12: Vitamin B-12: 305 pg/mL (ref 232–1245)

## 2019-03-16 ENCOUNTER — Encounter: Payer: Self-pay | Admitting: Family Medicine

## 2019-03-16 ENCOUNTER — Other Ambulatory Visit: Payer: Self-pay | Admitting: Family Medicine

## 2019-03-16 DIAGNOSIS — E559 Vitamin D deficiency, unspecified: Secondary | ICD-10-CM

## 2019-03-16 MED ORDER — VITAMIN D (ERGOCALCIFEROL) 1.25 MG (50000 UNIT) PO CAPS: 50000.0000 [IU] | ORAL_CAPSULE | ORAL | 3 refills | Status: DC

## 2019-03-17 MED FILL — VIT D2 1.25 MG (50,000 UNIT: 1.25 MG | 30 days supply | Qty: 5 | Fill #0

## 2019-03-19 ENCOUNTER — Other Ambulatory Visit: Payer: Self-pay

## 2019-03-19 ENCOUNTER — Ambulatory Visit (INDEPENDENT_AMBULATORY_CARE_PROVIDER_SITE_OTHER): Payer: Medicaid Other | Admitting: *Deleted

## 2019-03-19 DIAGNOSIS — I639 Cerebral infarction, unspecified: Secondary | ICD-10-CM

## 2019-03-19 LAB — CUP PACEART REMOTE DEVICE CHECK
Date Time Interrogation Session: 20200516184102
Implantable Pulse Generator Implant Date: 20180613

## 2019-03-27 NOTE — Progress Notes (Signed)
Carelink Summary Report / Loop Recorder 

## 2019-04-05 MED FILL — GABAPENTIN 300 MG CAPSULE: 300 | 30 days supply | Qty: 60 | Fill #1

## 2019-04-09 MED FILL — ATORVASTATIN 80 MG TABLET: 80 | 30 days supply | Qty: 30 | Fill #5

## 2019-04-19 ENCOUNTER — Ambulatory Visit (INDEPENDENT_AMBULATORY_CARE_PROVIDER_SITE_OTHER): Payer: Medicaid Other | Admitting: *Deleted

## 2019-04-19 DIAGNOSIS — I6310 Cerebral infarction due to embolism of unspecified precerebral artery: Secondary | ICD-10-CM | POA: Diagnosis not present

## 2019-04-19 LAB — CUP PACEART REMOTE DEVICE CHECK
Date Time Interrogation Session: 20200618181832
Implantable Pulse Generator Implant Date: 20180613
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
## 2019-04-23 ENCOUNTER — Encounter: Payer: Self-pay | Admitting: Neurology

## 2019-04-23 ENCOUNTER — Telehealth (INDEPENDENT_AMBULATORY_CARE_PROVIDER_SITE_OTHER): Payer: Medicaid Other | Admitting: Neurology

## 2019-04-23 ENCOUNTER — Other Ambulatory Visit: Payer: Self-pay

## 2019-04-23 VITALS — Ht 69.0 in | Wt 145.0 lb

## 2019-04-23 DIAGNOSIS — G4485 Primary stabbing headache: Secondary | ICD-10-CM | POA: Diagnosis not present

## 2019-04-23 NOTE — Progress Notes (Signed)
Carelink Summary Report / Loop Recorder 

## 2019-05-14 ENCOUNTER — Other Ambulatory Visit: Payer: Self-pay | Admitting: Family Medicine

## 2019-05-14 ENCOUNTER — Other Ambulatory Visit: Payer: Self-pay | Admitting: Neurology

## 2019-05-14 MED FILL — ATORVASTATIN 80 MG TABLET: 80 | 90 days supply | Qty: 90 | Fill #0

## 2019-05-15 MED FILL — GABAPENTIN 300 MG CAPSULE: 300 | 30 days supply | Qty: 60 | Fill #0

## 2019-05-22 ENCOUNTER — Ambulatory Visit (INDEPENDENT_AMBULATORY_CARE_PROVIDER_SITE_OTHER): Payer: Medicaid Other | Admitting: *Deleted

## 2019-05-22 DIAGNOSIS — I6310 Cerebral infarction due to embolism of unspecified precerebral artery: Secondary | ICD-10-CM

## 2019-05-22 LAB — CUP PACEART REMOTE DEVICE CHECK
Date Time Interrogation Session: 20200721180937
Implantable Pulse Generator Implant Date: 20180613

## 2019-06-06 NOTE — Progress Notes (Signed)
Carelink Summary Report / Loop Recorder 

## 2019-06-18 MED FILL — GABAPENTIN 300 MG CAPSULE: 300 | 30 days supply | Qty: 60 | Fill #1

## 2019-06-24 LAB — CUP PACEART REMOTE DEVICE CHECK
Date Time Interrogation Session: 20200823200630
Implantable Pulse Generator Implant Date: 20180613

## 2019-06-25 ENCOUNTER — Ambulatory Visit (INDEPENDENT_AMBULATORY_CARE_PROVIDER_SITE_OTHER): Payer: Medicaid Other | Admitting: *Deleted

## 2019-06-25 DIAGNOSIS — I6310 Cerebral infarction due to embolism of unspecified precerebral artery: Secondary | ICD-10-CM

## 2019-07-02 NOTE — Progress Notes (Signed)
Carelink Summary Report / Loop Recorder 

## 2019-07-16 MED FILL — GABAPENTIN 300 MG CAPSULE: 300 | 30 days supply | Qty: 60 | Fill #2

## 2019-07-30 ENCOUNTER — Ambulatory Visit (INDEPENDENT_AMBULATORY_CARE_PROVIDER_SITE_OTHER): Payer: Medicaid Other | Admitting: *Deleted

## 2019-07-30 DIAGNOSIS — I6310 Cerebral infarction due to embolism of unspecified precerebral artery: Secondary | ICD-10-CM | POA: Diagnosis not present

## 2019-07-31 LAB — CUP PACEART REMOTE DEVICE CHECK
Date Time Interrogation Session: 20200928211222
Implantable Pulse Generator Implant Date: 20180613

## 2019-08-08 NOTE — Progress Notes (Signed)
Carelink Summary Report / Loop Recorder 

## 2019-08-20 MED FILL — ATORVASTATIN 80 MG TABLET: 80 | 90 days supply | Qty: 90 | Fill #1

## 2019-08-20 MED FILL — GABAPENTIN 300 MG CAPSULE: 300 | 30 days supply | Qty: 60 | Fill #3

## 2019-09-02 DIAGNOSIS — R350 Frequency of micturition: Secondary | ICD-10-CM

## 2019-09-02 HISTORY — DX: Frequency of micturition: R35.0

## 2019-09-02 LAB — CUP PACEART REMOTE DEVICE CHECK
Date Time Interrogation Session: 20201031210953
Implantable Pulse Generator Implant Date: 20180613

## 2019-09-03 ENCOUNTER — Ambulatory Visit (INDEPENDENT_AMBULATORY_CARE_PROVIDER_SITE_OTHER): Payer: Medicare Other | Admitting: *Deleted

## 2019-09-03 DIAGNOSIS — I6310 Cerebral infarction due to embolism of unspecified precerebral artery: Secondary | ICD-10-CM

## 2019-09-12 ENCOUNTER — Other Ambulatory Visit: Payer: Self-pay

## 2019-09-12 ENCOUNTER — Encounter: Payer: Self-pay | Admitting: Family Medicine

## 2019-09-12 ENCOUNTER — Ambulatory Visit (INDEPENDENT_AMBULATORY_CARE_PROVIDER_SITE_OTHER): Payer: Medicare Other | Admitting: Family Medicine

## 2019-09-12 VITALS — BP 118/89 | HR 93 | Temp 97.8°F | Ht 69.0 in | Wt 139.6 lb

## 2019-09-12 DIAGNOSIS — E559 Vitamin D deficiency, unspecified: Secondary | ICD-10-CM

## 2019-09-12 DIAGNOSIS — Z09 Encounter for follow-up examination after completed treatment for conditions other than malignant neoplasm: Secondary | ICD-10-CM

## 2019-09-12 DIAGNOSIS — R531 Weakness: Secondary | ICD-10-CM

## 2019-09-12 DIAGNOSIS — F419 Anxiety disorder, unspecified: Secondary | ICD-10-CM | POA: Diagnosis not present

## 2019-09-12 DIAGNOSIS — E785 Hyperlipidemia, unspecified: Secondary | ICD-10-CM

## 2019-09-12 DIAGNOSIS — Z23 Encounter for immunization: Secondary | ICD-10-CM

## 2019-09-12 DIAGNOSIS — R350 Frequency of micturition: Secondary | ICD-10-CM

## 2019-09-12 DIAGNOSIS — Z8673 Personal history of transient ischemic attack (TIA), and cerebral infarction without residual deficits: Secondary | ICD-10-CM

## 2019-09-12 DIAGNOSIS — R8290 Unspecified abnormal findings in urine: Secondary | ICD-10-CM | POA: Diagnosis not present

## 2019-09-12 DIAGNOSIS — N390 Urinary tract infection, site not specified: Secondary | ICD-10-CM

## 2019-09-12 LAB — POCT URINALYSIS DIPSTICK
Bilirubin, UA: NEGATIVE
Glucose, UA: NEGATIVE
Ketones, UA: NEGATIVE
Nitrite, UA: NEGATIVE
Protein, UA: NEGATIVE
Spec Grav, UA: 1.01 (ref 1.010–1.025)
Urobilinogen, UA: 0.2 E.U./dL
pH, UA: 5.5 (ref 5.0–8.0)

## 2019-09-12 MED ORDER — SULFAMETHOXAZOLE-TRIMETHOPRIM 800-160 MG PO TABS: 1.0000 | ORAL_TABLET | Freq: Two times a day (BID) | ORAL | 0 refills | Status: DC

## 2019-09-12 MED ORDER — GABAPENTIN 300 MG PO CAPS: ORAL_CAPSULE | ORAL | 6 refills | Status: DC

## 2019-09-12 MED ORDER — ATORVASTATIN CALCIUM 80 MG PO TABS: ORAL_TABLET | ORAL | 6 refills | Status: DC

## 2019-09-12 MED ORDER — OXYBUTYNIN CHLORIDE ER 10 MG PO TB24: 10.0000 mg | ORAL_TABLET | Freq: Every day | ORAL | 6 refills | Status: DC

## 2019-09-12 MED ORDER — VITAMIN D (ERGOCALCIFEROL) 1.25 MG (50000 UNIT) PO CAPS: 50000.0000 [IU] | ORAL_CAPSULE | ORAL | 6 refills | Status: DC

## 2019-09-12 MED FILL — OXYBUTYNIN CL ER 10 MG TAB: 10 | 90 days supply | Qty: 90 | Fill #0

## 2019-09-12 MED FILL — SULFAMETHOXAZOLE-TMP DS TAB: 800-160 | 7 days supply | Qty: 14 | Fill #0

## 2019-09-12 MED FILL — VIT D2 1.25 MG (50,000 UNIT: 1.25 MG | 35 days supply | Qty: 5 | Fill #0

## 2019-09-12 NOTE — Progress Notes (Signed)
[facility]    Established Patient Office Visit  Subjective:  Patient ID: Daniel Stuart, male    DOB: 1966-09-16  Age: 53 y.o. MRN: [id number]  CC:  Chief Complaint  Patient presents with  . Follow-up    6 month follow up     HPI Daniel Stuart is a 53 year old male who presents for Follow Up today.   Past Medical History:  Diagnosis Date  . Anxiety   . Stroke (cerebrum) (HCC)   . Tobacco use   . Urinary frequency 09/2019  . Vitamin D deficiency    Current Status: Since his last office visit, he is doing well with no complaints. He has c/o urinary frequency X 3 months or so. He states that right arm pain is better controlled since Neurologist increased dosage of Gabapentin.  He continues to have right-sided weakness. He continues to use cane as needed for ambulation. He is smoking 1 pack of cigarettes every other day. He denies fevers, chills, fatigue, recent infections, weight loss, and night sweats. He has not had any headaches, visual changes, dizziness, and falls. No chest pain, heart palpitations, cough and shortness of breath reported. No reports of GI problems such as nausea, vomiting, diarrhea, and constipation. He has no reports of blood in stools, dysuria and hematuria. No depression or anxiety, and denies suicidal ideations, homicidal ideations, or auditory hallucinations. He denies pain today.   Past Surgical History:  Procedure Laterality Date  . FOOT SURGERY    . LOOP RECORDER INSERTION N/A 04/13/2017   Procedure: Loop Recorder Insertion;  Surgeon: Marinus Maw, MD;  Location: [facility] INVASIVE CV LAB;  Service: Cardiovascular;  Laterality: N/A;  . TEE WITHOUT CARDIOVERSION N/A 04/13/2017   Procedure: TRANSESOPHAGEAL ECHOCARDIOGRAM (TEE);  Surgeon: Jake Bathe, MD;  Location: [facility] ENDOSCOPY;  Service: Cardiovascular;  Laterality: N/A;    Family History  Problem Relation Age of Onset  . Diabetes Mother   .  Diabetes Father   . Heart failure Father   . Cancer Sister     Social History   Socioeconomic History  . Marital status: Legally Separated    Spouse name: Not on file  . Number of children: 1  . Years of education: Not on file  . Highest education level: Some college, no degree  Occupational History  . Occupation: disabled  Social Needs  . Financial resource strain: Not on file  . Food insecurity    Worry: Not on file    Inability: Not on file  . Transportation needs    Medical: Not on file    Non-medical: Not on file  Tobacco Use  . Smoking status: Current Every Day Smoker    Packs/day: 1.00    Types: Cigarettes  . Smokeless tobacco: Never Used  Substance and Sexual Activity  . Alcohol use: No  . Drug use: No  . Sexual activity: Not Currently    Partners: Female  Lifestyle  . Physical activity    Days per week: Not on file    Minutes per session: Not on file  . Stress: Not on file  Relationships  . Social Musician on phone: Not on file    Gets together: Not on file    Attends religious service: Not on file    Active member of club or organization: Not on file    Attends meetings of clubs or organizations: Not on file  Relationship status: Not on file  . Intimate partner violence    Fear of current or ex partner: Not on file    Emotionally abused: Not on file    Physically abused: Not on file    Forced sexual activity: Not on file  Other Topics Concern  . Not on file  Social History Narrative   Patient is right-handed. He lives in a one level home with his mother since CVA. He drinks one cup of coffee and 2-3 sodas a day. He walks when weather permitting.    Outpatient Medications Prior to Visit  Medication Sig Dispense Refill  . aspirin 81 MG EC tablet Take 1 tablet (81 mg total) by mouth daily. 30 tablet 3  . atorvastatin (LIPITOR) 80 MG tablet TAKE 1 TABLET BY MOUTH DAILY AT 6 PM. 90 tablet 1  . gabapentin (NEURONTIN) 300 MG capsule TAKE 1  CAPSULE BY MOUTH BY MOUTH TWO TIMES DAILY 180 capsule 1  . Vitamin D, Ergocalciferol, (DRISDOL) 1.25 MG (50000 UT) CAPS capsule Take 1 capsule (50,000 Units total) by mouth every 7 (seven) days. (Patient not taking: Reported on 09/12/2019) 5 capsule 3   No facility-administered medications prior to visit.     No Known Allergies  ROS Review of Systems  Constitutional: Negative.   HENT: Negative.   Eyes: Negative.   Respiratory: Negative.   Cardiovascular: Negative.   Gastrointestinal: Negative.   Endocrine: Negative.   Genitourinary: Positive for difficulty urinating and frequency.  Musculoskeletal: Negative.   Skin: Negative.   Allergic/Immunologic: Negative.   Neurological: Positive for dizziness (occasional ), weakness (r/t recent stroke), numbness and headaches (occasional ).  Hematological: Negative.   Psychiatric/Behavioral: Negative.       Objective:    Physical Exam  Constitutional: He is oriented to person, place, and time. He appears well-developed and well-nourished.  HENT:  Head: Normocephalic and atraumatic.  Eyes: Conjunctivae are normal.  Neck: Normal range of motion. Neck supple.  Cardiovascular: Normal rate, regular rhythm, normal heart sounds and intact distal pulses.  Pulmonary/Chest: Effort normal and breath sounds normal.  Abdominal: Soft. Bowel sounds are normal.  Musculoskeletal:     Comments: Right sided weakness r/t stroke  Neurological: He is alert and oriented to person, place, and time. He has normal reflexes.  Skin: Skin is warm and dry.  Psychiatric: He has a normal mood and affect. His behavior is normal. Judgment and thought content normal.  Nursing note and vitals reviewed.   BP 118/89   Pulse 93   Temp 97.8 F (36.6 C) (Oral)   Ht 5\' 9"  (1.753 m)   Wt 139 lb 9.6 oz (63.3 kg)   SpO2 95%   BMI 20.62 kg/m  Wt Readings from Last 3 Encounters:  09/12/19 139 lb 9.6 oz (63.3 kg)  04/23/19 145 lb (65.8 kg)  03/12/19 143 lb (64.9 kg)      Health Maintenance Due  Topic Date Due  . Hepatitis C Screening  12/24/1965  . COLONOSCOPY  04/16/2014    There are no preventive care reminders to display for this patient.  Lab Results  Component Value Date   TSH 1.770 03/12/2019   Lab Results  Component Value Date   WBC 8.0 03/12/2019   HGB 12.3 (L) 03/12/2019   HCT 34.9 (L) 03/12/2019   MCV 103 (H) 03/12/2019   PLT 240 03/12/2019   Lab Results  Component Value Date   NA 143 11/30/2017   K 4.2 11/30/2017   CO2 24 11/30/2017  GLUCOSE 111 (H) 11/30/2017   BUN 5 (L) 11/30/2017   CREATININE 1.10 11/30/2017   BILITOT 0.4 11/30/2017   ALKPHOS 148 (H) 11/30/2017   AST 19 11/30/2017   ALT 19 11/30/2017   PROT 7.9 11/30/2017   ALBUMIN 4.6 11/30/2017   CALCIUM 9.4 11/30/2017   ANIONGAP 10 04/13/2017   Lab Results  Component Value Date   CHOL 112 03/12/2019   Lab Results  Component Value Date   HDL 37 (L) 03/12/2019   Lab Results  Component Value Date   LDLCALC 59 03/12/2019   Lab Results  Component Value Date   TRIG 78 03/12/2019   Lab Results  Component Value Date   CHOLHDL 3.0 03/12/2019   Lab Results  Component Value Date   HGBA1C 5.3 05/30/2018   Assessment & Plan:   1. S/P stroke due to cerebrovascular disease Stable. No signs and symptoms of distress  2. Right sided weakness - gabapentin (NEURONTIN) 300 MG capsule; TAKE 1 CAPSULE BY MOUTH BY MOUTH TWO TIMES DAILY  Dispense: 180 capsule; Refill: 6  3. Anxiety Stable today.   4. Hyperlipidemia, unspecified hyperlipidemia type - atorvastatin (LIPITOR) 80 MG tablet; Take 1 tablet by mouth every evening.  Dispense: 30 tablet; Refill: 6  5. Vitamin D deficiency - Vitamin D, Ergocalciferol, (DRISDOL) 1.25 MG (50000 UT) CAPS capsule; Take 1 capsule (50,000 Units total) by mouth every 7 (seven) days.  Dispense: 5 capsule; Refill: 6  6. Urinary frequency We will initiate Ditropan today.  - PSA - oxybutynin (DITROPAN XL) 10 MG 24 hr tablet;  Take 1 tablet (10 mg total) by mouth at bedtime.  Dispense: 30 tablet; Refill: 6  7. Urinary tract infection without hematuria, site unspecified We will initiate antibiotic today.  - sulfamethoxazole-trimethoprim (BACTRIM DS) 800-160 MG tablet; Take 1 tablet by mouth 2 (two) times daily.  Dispense: 14 tablet; Refill: 0  8. Abnormal urinalysis Results are pending.  - Urinalysis Dipstick - Urine Culture  9. Needs flu shot - Flu Vaccine QUAD 6+ mos PF IM (Fluarix Quad PF)  10. Follow up He will follow up in 6 months.   Meds ordered this encounter  Medications  . Vitamin D, Ergocalciferol, (DRISDOL) 1.25 MG (50000 UT) CAPS capsule    Sig: Take 1 capsule (50,000 Units total) by mouth every 7 (seven) days.    Dispense:  5 capsule    Refill:  6  . gabapentin (NEURONTIN) 300 MG capsule    Sig: TAKE 1 CAPSULE BY MOUTH BY MOUTH TWO TIMES DAILY    Dispense:  180 capsule    Refill:  6  . atorvastatin (LIPITOR) 80 MG tablet    Sig: Take 1 tablet by mouth every evening.    Dispense:  30 tablet    Refill:  6  . sulfamethoxazole-trimethoprim (BACTRIM DS) 800-160 MG tablet    Sig: Take 1 tablet by mouth 2 (two) times daily.    Dispense:  14 tablet    Refill:  0  . oxybutynin (DITROPAN XL) 10 MG 24 hr tablet    Sig: Take 1 tablet (10 mg total) by mouth at bedtime.    Dispense:  30 tablet    Refill:  6    Orders Placed This Encounter  Procedures  . Urine Culture  . Flu Vaccine QUAD 6+ mos PF IM (Fluarix Quad PF)  . PSA  . Urinalysis Dipstick    Referral Orders  No referral(s) requested today    Raliegh Ip,  MSN, [name]-BC [facility] [place] [address] [place], [place] [address] [phone]- fax   Problem List Items Addressed This Visit      Nervous and Auditory   Right sided weakness   Relevant Medications   gabapentin (NEURONTIN) 300 MG capsule     Other   Anxiety   Hyperlipidemia    Relevant Medications   atorvastatin (LIPITOR) 80 MG tablet   S/P stroke due to cerebrovascular disease - Primary    Other Visit Diagnoses    Vitamin D deficiency       Relevant Medications   Vitamin D, Ergocalciferol, (DRISDOL) 1.25 MG (50000 UT) CAPS capsule   Urinary frequency       Relevant Medications   oxybutynin (DITROPAN XL) 10 MG 24 hr tablet   Other Relevant Orders   PSA   Urinary tract infection without hematuria, site unspecified       Relevant Medications   sulfamethoxazole-trimethoprim (BACTRIM DS) 800-160 MG tablet   Abnormal urinalysis       Relevant Orders   Urinalysis Dipstick (Completed)   Urine Culture   Needs flu shot       Relevant Orders   Flu Vaccine QUAD 6+ mos PF IM (Fluarix Quad PF) (Completed)   Follow up          Meds ordered this encounter  Medications  . Vitamin D, Ergocalciferol, (DRISDOL) 1.25 MG (50000 UT) CAPS capsule    Sig: Take 1 capsule (50,000 Units total) by mouth every 7 (seven) days.    Dispense:  5 capsule    Refill:  6  . gabapentin (NEURONTIN) 300 MG capsule    Sig: TAKE 1 CAPSULE BY MOUTH BY MOUTH TWO TIMES DAILY    Dispense:  180 capsule    Refill:  6  . atorvastatin (LIPITOR) 80 MG tablet    Sig: Take 1 tablet by mouth every evening.    Dispense:  30 tablet    Refill:  6  . sulfamethoxazole-trimethoprim (BACTRIM DS) 800-160 MG tablet    Sig: Take 1 tablet by mouth 2 (two) times daily.    Dispense:  14 tablet    Refill:  0  . oxybutynin (DITROPAN XL) 10 MG 24 hr tablet    Sig: Take 1 tablet (10 mg total) by mouth at bedtime.    Dispense:  30 tablet    Refill:  6    Follow-up: Return in about 6 months (around 03/11/2020).    Azzie Glatter, [name]

## 2019-09-13 DIAGNOSIS — E559 Vitamin D deficiency, unspecified: Secondary | ICD-10-CM | POA: Insufficient documentation

## 2019-09-13 LAB — PSA: Prostate Specific Ag, Serum: 0.7 ng/mL (ref 0.0–4.0)

## 2019-09-14 LAB — URINE CULTURE

## 2019-09-17 ENCOUNTER — Other Ambulatory Visit: Payer: Self-pay

## 2019-09-17 MED FILL — GABAPENTIN 300 MG CAPSULE: 300 | 90 days supply | Qty: 180 | Fill #0

## 2019-09-25 NOTE — Progress Notes (Signed)
Carelink Summary Report / Loop Recorder 

## 2019-10-04 ENCOUNTER — Ambulatory Visit (INDEPENDENT_AMBULATORY_CARE_PROVIDER_SITE_OTHER): Payer: Medicare Other | Admitting: *Deleted

## 2019-10-04 DIAGNOSIS — I6310 Cerebral infarction due to embolism of unspecified precerebral artery: Secondary | ICD-10-CM

## 2019-10-04 LAB — CUP PACEART REMOTE DEVICE CHECK
Date Time Interrogation Session: 20201203161033
Implantable Pulse Generator Implant Date: 20180613

## 2019-10-22 ENCOUNTER — Encounter: Payer: Self-pay | Admitting: Neurology
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
## 2019-10-23 ENCOUNTER — Encounter: Payer: Self-pay | Admitting: Neurology

## 2019-10-23 ENCOUNTER — Telehealth (INDEPENDENT_AMBULATORY_CARE_PROVIDER_SITE_OTHER): Payer: Medicare Other | Admitting: Neurology

## 2019-10-23 ENCOUNTER — Other Ambulatory Visit: Payer: Self-pay

## 2019-10-23 VITALS — Ht 66.0 in | Wt 140.0 lb

## 2019-10-23 DIAGNOSIS — G4485 Primary stabbing headache: Secondary | ICD-10-CM | POA: Diagnosis not present

## 2019-10-23 DIAGNOSIS — Z8673 Personal history of transient ischemic attack (TIA), and cerebral infarction without residual deficits: Secondary | ICD-10-CM | POA: Diagnosis not present

## 2019-10-30 NOTE — Progress Notes (Signed)
ILR remote 

## 2019-11-05 ENCOUNTER — Ambulatory Visit (INDEPENDENT_AMBULATORY_CARE_PROVIDER_SITE_OTHER): Payer: Medicare Other | Admitting: *Deleted

## 2019-11-05 DIAGNOSIS — I6310 Cerebral infarction due to embolism of unspecified precerebral artery: Secondary | ICD-10-CM

## 2019-11-05 LAB — CUP PACEART REMOTE DEVICE CHECK
Date Time Interrogation Session: 20210103231850
Implantable Pulse Generator Implant Date: 20180613

## 2019-11-14 NOTE — Addendum Note (Signed)
Addended byEverlena Cooper, Kandee Escalante R on: 11/14/2019 06:14 AM   Modules accepted: Level of Service

## 2019-12-06 ENCOUNTER — Ambulatory Visit (INDEPENDENT_AMBULATORY_CARE_PROVIDER_SITE_OTHER): Payer: Medicare Other | Admitting: *Deleted

## 2019-12-06 DIAGNOSIS — I6310 Cerebral infarction due to embolism of unspecified precerebral artery: Secondary | ICD-10-CM | POA: Diagnosis not present

## 2019-12-06 LAB — CUP PACEART REMOTE DEVICE CHECK
Date Time Interrogation Session: 20210203233335
Implantable Pulse Generator Implant Date: 20180612200000

## 2019-12-06 MED FILL — ATORVASTATIN 80 MG TABLET: 80 | 90 days supply | Qty: 90 | Fill #0

## 2019-12-06 NOTE — Progress Notes (Signed)
ILR Remote 

## 2019-12-26 MED FILL — OXYBUTYNIN CL ER 10 MG TAB: 10 | 90 days supply | Qty: 90 | Fill #1

## 2019-12-26 MED FILL — GABAPENTIN 300 MG CAPSULE: 300 | 90 days supply | Qty: 180 | Fill #1

## 2019-12-31 MED FILL — AMOXICILLIN 500 MG CAPSULE: 500 | 7 days supply | Qty: 21 | Fill #0

## 2019-12-31 MED FILL — IBUPROFEN 600 MG TABLET: 600 | 5 days supply | Qty: 20 | Fill #0

## 2020-01-07 ENCOUNTER — Ambulatory Visit (INDEPENDENT_AMBULATORY_CARE_PROVIDER_SITE_OTHER): Payer: Medicare Other | Admitting: *Deleted

## 2020-01-07 DIAGNOSIS — I6310 Cerebral infarction due to embolism of unspecified precerebral artery: Secondary | ICD-10-CM | POA: Diagnosis not present

## 2020-01-07 LAB — CUP PACEART REMOTE DEVICE CHECK
Date Time Interrogation Session: 20210308023300
Implantable Pulse Generator Implant Date: 20180613

## 2020-01-07 NOTE — Progress Notes (Signed)
ILR Remote 

## 2020-01-15 MED FILL — IBUPROFEN 600 MG TABLET: 600 | 5 days supply | Qty: 20 | Fill #0

## 2020-01-28 MED FILL — AMOXICILLIN 500 MG CAPSULE: 500 | 7 days supply | Qty: 21 | Fill #0

## 2020-01-28 MED FILL — IBUPROFEN 600 MG TABLET: 600 | 5 days supply | Qty: 20 | Fill #0

## 2020-01-31 ENCOUNTER — Ambulatory Visit: Payer: Medicare Other | Attending: Internal Medicine

## 2020-01-31 DIAGNOSIS — Z23 Encounter for immunization: Secondary | ICD-10-CM

## 2020-01-31 NOTE — Progress Notes (Signed)
   Covid-19 Vaccination Clinic  Name:  GRAEDEN BITNER    MRN: [id number] DOB: 1965-11-04  01/31/2020  Mr. Ostermiller was observed post Covid-19 immunization for 15 minutes without incident. He was provided with Vaccine Information Sheet and instruction to access the V-Safe system.   Mr. Dorantes was instructed to call 911 with any severe reactions post vaccine: Marland Kitchen Difficulty breathing  . Swelling of face and throat  . A fast heartbeat  . A bad rash all over body  . Dizziness and weakness   Immunizations Administered    Name Date Dose VIS Date Route   Pfizer COVID-19 Vaccine 01/31/2020 12:14 PM 0.3 mL 10/12/2019 Intramuscular   Manufacturer: [employer], [employer]   Lot: [id number]   NDC: [address]-9

## 2020-02-07 ENCOUNTER — Ambulatory Visit (INDEPENDENT_AMBULATORY_CARE_PROVIDER_SITE_OTHER): Payer: Medicare Other | Admitting: *Deleted

## 2020-02-07 DIAGNOSIS — I6310 Cerebral infarction due to embolism of unspecified precerebral artery: Secondary | ICD-10-CM

## 2020-02-07 LAB — CUP PACEART REMOTE DEVICE CHECK
Date Time Interrogation Session: 20210408033528
Implantable Pulse Generator Implant Date: 20180613

## 2020-02-07 NOTE — Progress Notes (Signed)
ILR Remote 

## 2020-02-25 ENCOUNTER — Ambulatory Visit: Payer: Medicare Other | Attending: Internal Medicine

## 2020-02-25 DIAGNOSIS — Z23 Encounter for immunization: Secondary | ICD-10-CM

## 2020-02-25 NOTE — Progress Notes (Signed)
   Covid-19 Vaccination Clinic  Name:  Daniel Stuart    MRN: [id number] DOB: May 07, 1966  02/25/2020  [name] was observed post Covid-19 immunization for 15 minutes without incident. He was provided with Vaccine Information Sheet and instruction to access the V-Safe system.   [name] was instructed to call 911 with any severe reactions post vaccine: Marland Kitchen Difficulty breathing  . Swelling of face and throat  . A fast heartbeat  . A bad rash all over body  . Dizziness and weakness   Immunizations Administered    Name Date Dose VIS Date Route   Pfizer COVID-19 Vaccine 02/25/2020  9:26 AM 0.3 mL 12/26/2018 Intramuscular   Manufacturer: [employer], [employer]   Lot: [id number]   NDC: [address]-8

## 2020-03-01 DIAGNOSIS — I499 Cardiac arrhythmia, unspecified: Secondary | ICD-10-CM

## 2020-03-01 DIAGNOSIS — R296 Repeated falls: Secondary | ICD-10-CM

## 2020-03-01 HISTORY — DX: Cardiac arrhythmia, unspecified: I49.9

## 2020-03-01 HISTORY — DX: Repeated falls: R29.6

## 2020-03-09 LAB — CUP PACEART REMOTE DEVICE CHECK
Date Time Interrogation Session: 20210509033953
Implantable Pulse Generator Implant Date: 20180613

## 2020-03-11 ENCOUNTER — Other Ambulatory Visit: Payer: Self-pay

## 2020-03-11 ENCOUNTER — Ambulatory Visit (INDEPENDENT_AMBULATORY_CARE_PROVIDER_SITE_OTHER): Payer: Medicare Other | Admitting: Family Medicine

## 2020-03-11 ENCOUNTER — Ambulatory Visit (INDEPENDENT_AMBULATORY_CARE_PROVIDER_SITE_OTHER): Payer: Medicare Other | Admitting: *Deleted

## 2020-03-11 ENCOUNTER — Encounter: Payer: Self-pay | Admitting: Family Medicine

## 2020-03-11 VITALS — BP 112/72 | HR 77 | Temp 98.3°F | Ht 66.0 in | Wt 138.6 lb

## 2020-03-11 DIAGNOSIS — R350 Frequency of micturition: Secondary | ICD-10-CM | POA: Insufficient documentation

## 2020-03-11 DIAGNOSIS — I499 Cardiac arrhythmia, unspecified: Secondary | ICD-10-CM | POA: Insufficient documentation

## 2020-03-11 DIAGNOSIS — I6310 Cerebral infarction due to embolism of unspecified precerebral artery: Secondary | ICD-10-CM | POA: Diagnosis not present

## 2020-03-11 DIAGNOSIS — R531 Weakness: Secondary | ICD-10-CM | POA: Diagnosis not present

## 2020-03-11 DIAGNOSIS — R296 Repeated falls: Secondary | ICD-10-CM | POA: Diagnosis not present

## 2020-03-11 DIAGNOSIS — Z9889 Other specified postprocedural states: Secondary | ICD-10-CM

## 2020-03-11 DIAGNOSIS — Z0000 Encounter for general adult medical examination without abnormal findings: Secondary | ICD-10-CM | POA: Diagnosis not present

## 2020-03-11 DIAGNOSIS — Z8673 Personal history of transient ischemic attack (TIA), and cerebral infarction without residual deficits: Secondary | ICD-10-CM

## 2020-03-11 DIAGNOSIS — F419 Anxiety disorder, unspecified: Secondary | ICD-10-CM

## 2020-03-11 DIAGNOSIS — Z09 Encounter for follow-up examination after completed treatment for conditions other than malignant neoplasm: Secondary | ICD-10-CM

## 2020-03-11 DIAGNOSIS — Z8639 Personal history of other endocrine, nutritional and metabolic disease: Secondary | ICD-10-CM | POA: Diagnosis not present

## 2020-03-11 LAB — POCT URINALYSIS DIPSTICK
Bilirubin, UA: NEGATIVE
Blood, UA: NEGATIVE
Glucose, UA: NEGATIVE
Ketones, UA: NEGATIVE
Nitrite, UA: NEGATIVE
Protein, UA: NEGATIVE
Spec Grav, UA: 1.015 (ref 1.010–1.025)
Urobilinogen, UA: 0.2 E.U./dL
pH, UA: 5.5 (ref 5.0–8.0)

## 2020-03-11 LAB — POCT GLYCOSYLATED HEMOGLOBIN (HGB A1C): Hemoglobin A1C: 5.3 % (ref 4.0–5.6)

## 2020-03-11 NOTE — Progress Notes (Signed)
[facility]   Established Patient Office Visit  Subjective:  Patient ID: Daniel Stuart, male    DOB: 1966-09-13  Age: 54 y.o. MRN: [id number]  CC:  Chief Complaint  Patient presents with  . Follow-up    Stroke    HPI Daniel Stuart is a 54 year old male who presents for Follow Up today.   Past Medical History:  Diagnosis Date  . Anxiety   . Stroke (cerebrum) (HCC)   . Tobacco use   . Urinary frequency 09/2019  . Vitamin D deficiency    Current Status: Since his last office visit, he is doing well with no complaints. He states that he has had a few minor falls r/t right sided weakness, which he currently uses a cane for assistance. He smokes 1 pack of cigarettes within 1 & 1/2 days.  He states that urinary frequency is improved since he has began Ditropan.  He denies fevers, chills, fatigue, recent infections, weight loss, and night sweats. He has not had any headaches, visual changes, dizziness, and falls. No chest pain, heart palpitations, cough and shortness of breath reported. Denies GI problems such as nausea, vomiting, diarrhea, and constipation. He has no reports of blood in stools, dysuria and hematuria. His anxiety is mild today. He denies suicidal ideations, homicidal ideations, or auditory hallucinations. He is taking all medications as prescribed. He denies pain today.   Past Surgical History:  Procedure Laterality Date  . FOOT SURGERY    . LOOP RECORDER INSERTION N/A 04/13/2017   Procedure: Loop Recorder Insertion;  Surgeon: Marinus Maw, MD;  [facility];  Service: Cardiovascular;  Laterality: N/A;  . TEE WITHOUT CARDIOVERSION N/A 04/13/2017   Procedure: TRANSESOPHAGEAL ECHOCARDIOGRAM (TEE);  Surgeon: Jake Bathe, MD;  Location: [facility] ENDOSCOPY;  Service: Cardiovascular;  Laterality: N/A;    Family History  Problem Relation Age of Onset  . Diabetes Mother   . Diabetes Father   . Heart  failure Father   . Cancer Sister     Social History   Socioeconomic History  . Marital status: Legally Separated    Spouse name: Not on file  . Number of children: 1  . Years of education: Not on file  . Highest education level: Some college, no degree  Occupational History  . Occupation: disabled  Tobacco Use  . Smoking status: Current Every Day Smoker    Packs/day: 1.00    Types: Cigarettes  . Smokeless tobacco: Never Used  Substance and Sexual Activity  . Alcohol use: No  . Drug use: No  . Sexual activity: Not Currently    Partners: Female  Other Topics Concern  . Not on file  Social History Narrative   Patient is right-handed. He lives in a one level home with his mother since CVA. He drinks one cup of coffee and 2-3 sodas a day. He walks when weather permitting.   Social Determinants of Health   Financial Resource Strain:   . Difficulty of Paying Living Expenses:   Food Insecurity:   . Worried About Programme researcher, broadcasting/film/video in the Last Year:   . Barista in the Last Year:   Transportation Needs:   . Freight forwarder (Medical):   Marland Kitchen Lack of Transportation (Non-Medical):   Physical Activity:   . Days of Exercise per Week:   . Minutes of Exercise per Session:   Stress:   . Feeling of  Stress :   Social Connections:   . Frequency of Communication with Friends and Family:   . Frequency of Social Gatherings with Friends and Family:   . Attends Religious Services:   . Active Member of Clubs or Organizations:   . Attends Archivist Meetings:   Marland Kitchen Marital Status:   Intimate Partner Violence:   . Fear of Current or Ex-Partner:   . Emotionally Abused:   Marland Kitchen Physically Abused:   . Sexually Abused:     Outpatient Medications Prior to Visit  Medication Sig Dispense Refill  . aspirin 81 MG EC tablet Take 1 tablet (81 mg total) by mouth daily. 30 tablet 3  . atorvastatin (LIPITOR) 80 MG tablet Take 1 tablet by mouth every evening. 30 tablet 6  .  gabapentin (NEURONTIN) 300 MG capsule TAKE 1 CAPSULE BY MOUTH BY MOUTH TWO TIMES DAILY 180 capsule 6  . oxybutynin (DITROPAN XL) 10 MG 24 hr tablet Take 1 tablet (10 mg total) by mouth at bedtime. 30 tablet 6  . sulfamethoxazole-trimethoprim (BACTRIM DS) 800-160 MG tablet Take 1 tablet by mouth 2 (two) times daily. 14 tablet 0  . Vitamin D, Ergocalciferol, (DRISDOL) 1.25 MG (50000 UT) CAPS capsule Take 1 capsule (50,000 Units total) by mouth every 7 (seven) days. 5 capsule 6   No facility-administered medications prior to visit.    No Known Allergies  ROS Review of Systems  Constitutional: Negative.   HENT: Negative.   Eyes: Negative.   Respiratory: Negative.   Cardiovascular: Negative.   Gastrointestinal: Negative.   Endocrine: Negative.   Genitourinary: Negative.   Musculoskeletal: Negative.   Skin: Negative.   Allergic/Immunologic: Negative.   Neurological: Positive for dizziness (occasional), weakness (occasional ) and headaches (occasional ).  Hematological: Negative.   Psychiatric/Behavioral: Negative.       Objective:    Physical Exam  Constitutional: He is oriented to person, place, and time. He appears well-developed and well-nourished.  HENT:  Head: Normocephalic and atraumatic.  Eyes: Conjunctivae are normal.  Cardiovascular: Intact distal pulses.  Arrhythmia auscultated (Loop Recorder inserted 04/2017).  Pulmonary/Chest: Effort normal and breath sounds normal.  Abdominal: Soft. Bowel sounds are normal.  Musculoskeletal:        General: Normal range of motion.     Cervical back: Normal range of motion and neck supple.  Neurological: He is alert and oriented to person, place, and time.  Skin: Skin is warm and dry.  Nursing note and vitals reviewed.   BP 112/72   Pulse 77   Temp 98.3 F (36.8 C)   Ht 5\' 6"  (1.676 m)   Wt 138 lb 9.6 oz (62.9 kg)   SpO2 100%   BMI 22.37 kg/m  Wt Readings from Last 3 Encounters:  03/11/20 138 lb 9.6 oz (62.9 kg)    10/22/19 140 lb (63.5 kg)  09/12/19 139 lb 9.6 oz (63.3 kg)     Health Maintenance Due  Topic Date Due  . Hepatitis C Screening  Never done  . COLONOSCOPY  Never done    There are no preventive care reminders to display for this patient.  Lab Results  Component Value Date   TSH 1.770 03/12/2019   Lab Results  Component Value Date   WBC 8.0 03/12/2019   HGB 12.3 (L) 03/12/2019   HCT 34.9 (L) 03/12/2019   MCV 103 (H) 03/12/2019   PLT 240 03/12/2019   Lab Results  Component Value Date   NA 143 11/30/2017   K 4.2  11/30/2017   CO2 24 11/30/2017   GLUCOSE 111 (H) 11/30/2017   BUN 5 (L) 11/30/2017   CREATININE 1.10 11/30/2017   BILITOT 0.4 11/30/2017   ALKPHOS 148 (H) 11/30/2017   AST 19 11/30/2017   ALT 19 11/30/2017   PROT 7.9 11/30/2017   ALBUMIN 4.6 11/30/2017   CALCIUM 9.4 11/30/2017   ANIONGAP 10 04/13/2017   Lab Results  Component Value Date   CHOL 112 03/12/2019   Lab Results  Component Value Date   HDL 37 (L) 03/12/2019   Lab Results  Component Value Date   LDLCALC 59 03/12/2019   Lab Results  Component Value Date   TRIG 78 03/12/2019   Lab Results  Component Value Date   CHOLHDL 3.0 03/12/2019   Lab Results  Component Value Date   HGBA1C 5.3 03/11/2020    Assessment & Plan:   1. S/P stroke due to cerebrovascular disease - Ambulatory referral to Physical Therapy  2. Right sided weakness - Ambulatory referral to Physical Therapy  3. Frequent falls Will will fax order for Rollator to medical supply store today.   4. Arrhythmia as indication for cardiac pacemaker replacement - Ambulatory referral to Cardiology  5. History of loop recorder Placed 04/2017, with no follow up since then.  - Ambulatory referral to Cardiology  6. Anxiety  7. History of hyperglycemia - POCT glycosylated hemoglobin (Hb A1C)  8. Urinary frequency  9. Health care maintenance - POCT urinalysis dipstick  10. Follow up He will follow up in 3 months.    No orders of the defined types were placed in this encounter.   Orders Placed This Encounter  Procedures  . Ambulatory referral to Physical Therapy  . Ambulatory referral to Cardiology  . POCT glycosylated hemoglobin (Hb A1C)  . POCT urinalysis dipstick     Referral Orders     Ambulatory referral to Physical Therapy     Ambulatory referral to Cardiology  Raliegh Ip,  MSN, [name]-BC [facility] Patient [facility] [address] [place], [place] [address] [phone] fax      Problem List Items Addressed This Visit      Nervous and Auditory   Right sided weakness   Relevant Orders   Ambulatory referral to Physical Therapy     Other   Anxiety   S/P stroke due to cerebrovascular disease - Primary   Relevant Orders   Ambulatory referral to Physical Therapy    Other Visit Diagnoses    Frequent falls       Arrhythmia as indication for cardiac pacemaker replacement       Relevant Orders   Ambulatory referral to Cardiology   History of loop recorder       Relevant Orders   Ambulatory referral to Cardiology   History of hyperglycemia       Relevant Orders   POCT glycosylated hemoglobin (Hb A1C) (Completed)   Urinary frequency       Health care maintenance       Relevant Orders   POCT urinalysis dipstick (Completed)   Follow up          No orders of the defined types were placed in this encounter.   Follow-up: Return in about 3 months (around 06/11/2020).    Kallie Locks, [name]

## 2020-03-13 NOTE — Progress Notes (Signed)
Carelink Summary Report / Loop Recorder 

## 2020-03-19 ENCOUNTER — Telehealth: Payer: Self-pay | Admitting: Family Medicine

## 2020-03-24 NOTE — Telephone Encounter (Signed)
error 

## 2020-03-25 MED FILL — GABAPENTIN 300 MG CAPSULE: 300 | 90 days supply | Qty: 180 | Fill #2

## 2020-03-25 MED FILL — ATORVASTATIN 80 MG TABLET: 80 | 90 days supply | Qty: 90 | Fill #1

## 2020-04-14 ENCOUNTER — Ambulatory Visit (INDEPENDENT_AMBULATORY_CARE_PROVIDER_SITE_OTHER): Payer: Medicare Other | Admitting: *Deleted

## 2020-04-14 DIAGNOSIS — I6310 Cerebral infarction due to embolism of unspecified precerebral artery: Secondary | ICD-10-CM

## 2020-04-14 LAB — CUP PACEART REMOTE DEVICE CHECK
Date Time Interrogation Session: 20210613232836
Implantable Pulse Generator Implant Date: 20180613

## 2020-04-15 NOTE — Progress Notes (Signed)
Carelink Summary Report / Loop Recorder 

## 2020-05-19 ENCOUNTER — Ambulatory Visit (INDEPENDENT_AMBULATORY_CARE_PROVIDER_SITE_OTHER): Payer: Medicare Other | Admitting: *Deleted

## 2020-05-19 DIAGNOSIS — I6310 Cerebral infarction due to embolism of unspecified precerebral artery: Secondary | ICD-10-CM | POA: Diagnosis not present

## 2020-05-19 LAB — CUP PACEART REMOTE DEVICE CHECK
Date Time Interrogation Session: 20210718230632
Implantable Pulse Generator Implant Date: 20180613

## 2020-05-21 NOTE — Progress Notes (Signed)
Carelink Summary Report / Loop Recorder 

## 2020-06-11 ENCOUNTER — Ambulatory Visit (INDEPENDENT_AMBULATORY_CARE_PROVIDER_SITE_OTHER): Payer: Medicare Other | Admitting: Family Medicine

## 2020-06-11 ENCOUNTER — Other Ambulatory Visit: Payer: Self-pay

## 2020-06-11 ENCOUNTER — Encounter: Payer: Self-pay | Admitting: Family Medicine

## 2020-06-11 VITALS — BP 110/83 | HR 95 | Temp 98.2°F | Resp 16 | Ht 66.0 in | Wt 134.0 lb

## 2020-06-11 DIAGNOSIS — Z8673 Personal history of transient ischemic attack (TIA), and cerebral infarction without residual deficits: Secondary | ICD-10-CM | POA: Diagnosis not present

## 2020-06-11 DIAGNOSIS — F419 Anxiety disorder, unspecified: Secondary | ICD-10-CM

## 2020-06-11 DIAGNOSIS — S80812A Abrasion, left lower leg, initial encounter: Secondary | ICD-10-CM | POA: Diagnosis not present

## 2020-06-11 DIAGNOSIS — Z0000 Encounter for general adult medical examination without abnormal findings: Secondary | ICD-10-CM

## 2020-06-11 DIAGNOSIS — R8290 Unspecified abnormal findings in urine: Secondary | ICD-10-CM

## 2020-06-11 DIAGNOSIS — F411 Generalized anxiety disorder: Secondary | ICD-10-CM

## 2020-06-11 DIAGNOSIS — R296 Repeated falls: Secondary | ICD-10-CM | POA: Diagnosis not present

## 2020-06-11 DIAGNOSIS — E559 Vitamin D deficiency, unspecified: Secondary | ICD-10-CM

## 2020-06-11 DIAGNOSIS — E538 Deficiency of other specified B group vitamins: Secondary | ICD-10-CM

## 2020-06-11 DIAGNOSIS — Z09 Encounter for follow-up examination after completed treatment for conditions other than malignant neoplasm: Secondary | ICD-10-CM

## 2020-06-11 LAB — POCT URINALYSIS DIPSTICK
Bilirubin, UA: NEGATIVE
Blood, UA: NEGATIVE
Glucose, UA: NEGATIVE
Ketones, UA: NEGATIVE
Nitrite, UA: NEGATIVE
Protein, UA: NEGATIVE
Spec Grav, UA: 1.01 (ref 1.010–1.025)
Urobilinogen, UA: 0.2 E.U./dL
pH, UA: 5.5 (ref 5.0–8.0)

## 2020-06-11 NOTE — Progress Notes (Signed)
[facility]    Established Patient Office Visit  Subjective:  Patient ID: Daniel Stuart, male    DOB: October 26, 1966  Age: 54 y.o. MRN: [id number]  CC:  Chief Complaint  Patient presents with  . Follow-up    stroke     HPI Daniel Stuart is a 54 year old male who presents for Follow Up today.     Patient Active Problem List   Diagnosis Date Noted  . Frequent falls 03/11/2020  . Arrhythmia as indication for cardiac pacemaker replacement 03/11/2020  . Urinary frequency 03/11/2020  . Vitamin D deficiency 09/13/2019  . S/P stroke due to cerebrovascular disease 03/12/2019  . Right sided weakness 03/12/2019  . Anxiety 03/12/2019  . Hyperlipidemia   . Tobacco abuse 04/12/2017  . Dyslipidemia 04/12/2017  . PVD (peripheral vascular disease) (HCC) 04/12/2017  . Carotid arterial disease (HCC) 04/12/2017  . CVA (cerebral vascular accident) (HCC) 04/11/2017    Past Medical History:  Diagnosis Date  . Anxiety   . Arrhythmia as indication for cardiac pacemaker replacement 03/2020  . Frequent falls 03/2020  . History of loop recorder 04/2017  . Stroke (cerebrum) (HCC)   . Tobacco use   . Urinary frequency 09/2019  . Vitamin D deficiency    Current Status: Since his last office visit, he is doing well with no complaints. His anxiety is moderate today, r/t to his recent history of frequent falls. He has a recent history of Stroke and had generalized moderate weakness of lower extremity weakness > upper extremity weakness. [name] also takes care of his aging mother. He did have a fall today as her was making his way to our clinic today for his office visit, and suffered a minor left knee abrasion.with minor pain and discomfort noted. He denies fevers, chills, fatigue, recent infections, weight loss, and night sweats. He has not had any headaches, and visual changes.  No chest pain, heart palpitations, cough and shortness of breath  reported. Denies GI problems such as nausea, vomiting, diarrhea, and constipation. He has no reports of blood in stools, dysuria and hematuria.  He is taking all medications as prescribed. He denies pain today.   Past Surgical History:  Procedure Laterality Date  . FOOT SURGERY    . LOOP RECORDER INSERTION N/A 04/13/2017   Procedure: Loop Recorder Insertion;  Surgeon: Marinus Maw, MD;  Location: [facility]VAS[facility];  Service: Cardiovascular;  Laterality: N/A;  . TEE WITHOUT CARDIOVERSION N/A 04/13/2017   Procedure: TRANSESOPHAGEAL ECHOCARDIOGRAM (TEE);  Surgeon: Jake Bathe, MD;  Location: [facility] ENDOSCOPY;  Service: Cardiovascular;  Laterality: N/A;    Family History  Problem Relation Age of Onset  . Diabetes Mother   . Diabetes Father   . Heart failure Father   . Cancer Sister     Social History   Socioeconomic History  . Marital status: Legally Separated    Spouse name: Not on file  . Number of children: 1  . Years of education: Not on file  . Highest education level: Some college, no degree  Occupational History  . Occupation: disabled  Tobacco Use  . Smoking status: Current Every Day Smoker    Packs/day: 1.00    Types: Cigarettes  . Smokeless tobacco: Never Used  Vaping Use  . Vaping Use: Never used  Substance and Sexual Activity  . Alcohol use: No  . Drug use: No  . Sexual activity: Not Currently  Partners: Female  Other Topics Concern  . Not on file  Social History Narrative   Patient is right-handed. He lives in a one level home with his mother since CVA. He drinks one cup of coffee and 2-3 sodas a day. He walks when weather permitting.   Social Determinants of Health   Financial Resource Strain:   . Difficulty of Paying Living Expenses:   Food Insecurity:   . Worried About Programme researcher, broadcasting/film/video in the Last Year:   . Barista in the Last Year:   Transportation Needs:   . Freight forwarder (Medical):   Marland Kitchen Lack of Transportation (Non-Medical):     Physical Activity:   . Days of Exercise per Week:   . Minutes of Exercise per Session:   Stress:   . Feeling of Stress :   Social Connections:   . Frequency of Communication with Friends and Family:   . Frequency of Social Gatherings with Friends and Family:   . Attends Religious Services:   . Active Member of Clubs or Organizations:   . Attends Banker Meetings:   Marland Kitchen Marital Status:   Intimate Partner Violence:   . Fear of Current or Ex-Partner:   . Emotionally Abused:   Marland Kitchen Physically Abused:   . Sexually Abused:     Outpatient Medications Prior to Visit  Medication Sig Dispense Refill  . aspirin 81 MG EC tablet Take 1 tablet (81 mg total) by mouth daily. 30 tablet 3  . atorvastatin (LIPITOR) 80 MG tablet Take 1 tablet by mouth every evening. 30 tablet 6  . gabapentin (NEURONTIN) 300 MG capsule TAKE 1 CAPSULE BY MOUTH BY MOUTH TWO TIMES DAILY 180 capsule 6  . oxybutynin (DITROPAN XL) 10 MG 24 hr tablet Take 1 tablet (10 mg total) by mouth at bedtime. (Patient not taking: Reported on 06/11/2020) 30 tablet 6  . Vitamin D, Ergocalciferol, (DRISDOL) 1.25 MG (50000 UT) CAPS capsule Take 1 capsule (50,000 Units total) by mouth every 7 (seven) days. (Patient not taking: Reported on 06/11/2020) 5 capsule 6  . sulfamethoxazole-trimethoprim (BACTRIM DS) 800-160 MG tablet Take 1 tablet by mouth 2 (two) times daily. 14 tablet 0   No facility-administered medications prior to visit.    No Known Allergies  ROS Review of Systems  Constitutional: Negative.   HENT: Negative.   Eyes: Negative.   Respiratory: Negative.   Cardiovascular: Negative.   Gastrointestinal: Negative.   Endocrine: Negative.   Genitourinary: Negative.   Musculoskeletal: Negative.   Skin: Negative.   Allergic/Immunologic: Negative.   Neurological: Positive for dizziness (occasional ) and headaches (occasional ).  Hematological: Negative.   Psychiatric/Behavioral: Negative.       Objective:     Physical Exam Vitals and nursing note reviewed.  Constitutional:      Appearance: Normal appearance.  HENT:     Head: Normocephalic and atraumatic.     Nose: Nose normal.     Mouth/Throat:     Mouth: Mucous membranes are moist.     Pharynx: Oropharynx is clear.  Cardiovascular:     Rate and Rhythm: Normal rate and regular rhythm.     Pulses: Normal pulses.     Heart sounds: Normal heart sounds.  Pulmonary:     Effort: Pulmonary effort is normal.     Breath sounds: Normal breath sounds.  Abdominal:     General: Bowel sounds are normal. There is distension.     Palpations: Abdomen is soft.  Musculoskeletal:  General: Normal range of motion.     Cervical back: Normal range of motion and neck supple.  Skin:    General: Skin is warm and dry.  Neurological:     General: No focal deficit present.     Mental Status: He is alert and oriented to person, place, and time.  Psychiatric:        Mood and Affect: Mood normal.        Behavior: Behavior normal.        Thought Content: Thought content normal.        Judgment: Judgment normal.     BP 110/83 (BP Location: Right Arm, Patient Position: Sitting, Cuff Size: Normal)   Pulse 95   Temp 98.2 F (36.8 C) (Oral)   Resp 16   Ht 5\' 6"  (1.676 m)   Wt 134 lb (60.8 kg)   SpO2 100%   BMI 21.63 kg/m  Wt Readings from Last 3 Encounters:  06/11/20 134 lb (60.8 kg)  03/11/20 138 lb 9.6 oz (62.9 kg)  10/22/19 140 lb (63.5 kg)     Health Maintenance Due  Topic Date Due  . Hepatitis C Screening  Never done  . COLONOSCOPY  Never done  . INFLUENZA VACCINE  06/01/2020    There are no preventive care reminders to display for this patient.  Lab Results  Component Value Date   TSH 1.520 06/11/2020   Lab Results  Component Value Date   WBC 11.0 (H) 06/11/2020   HGB 15.3 06/11/2020   HCT 44.9 06/11/2020   MCV 93 06/11/2020   PLT 230 06/11/2020   Lab Results  Component Value Date   NA 140 06/11/2020   K 3.7 06/11/2020    CO2 22 06/11/2020   GLUCOSE 95 06/11/2020   BUN 4 (L) 06/11/2020   CREATININE 1.17 06/11/2020   BILITOT 0.5 06/11/2020   ALKPHOS 133 (H) 06/11/2020   AST 15 06/11/2020   ALT 15 06/11/2020   PROT 7.5 06/11/2020   ALBUMIN 4.2 06/11/2020   CALCIUM 9.1 06/11/2020   ANIONGAP 10 04/13/2017   Lab Results  Component Value Date   CHOL 102 06/11/2020   Lab Results  Component Value Date   HDL 39 (L) 06/11/2020   Lab Results  Component Value Date   LDLCALC 49 06/11/2020   Lab Results  Component Value Date   TRIG 61 06/11/2020   Lab Results  Component Value Date   CHOLHDL 2.6 06/11/2020   Lab Results  Component Value Date   HGBA1C 5.3 03/11/2020      Assessment & Plan:   1. S/P stroke due to cerebrovascular disease No signs or symptoms of recurrence noted or reported today.  - CBC with Differential  2. Frequent falls Fall today with arrival to clinic today. Unable to use Rollator, as he is not able to get down front steps of his house. Referral to PT to assess.   3. Abrasion of skin of left lower leg Site cleaned and dressing applied to abrasion. Dressing is clean, dry and intact. Monitor.   4. Anxiety Moderate today. We will refer him to 05/11/2020, LCSW for further assessment. We will also refer him to Psychiatry today.   5. Vitamin D deficiency - Vitamin D, 25-hydroxy  6. Vitamin B12 deficiency - Vitamin B12  7. Abnormal urinalysis Results are pending.  - Urine Culture  8. Health care maintenance - Urinalysis Dipstick - Comprehensive metabolic panel - Lipid Panel - TSH - PSA  9. Follow  up He will follow up in 3 months.   No orders of the defined types were placed in this encounter.   Orders Placed This Encounter  Procedures  . Urine Culture  . CBC with Differential  . Comprehensive metabolic panel  . Lipid Panel  . TSH  . PSA  . Vitamin B12  . Vitamin D, 25-hydroxy  . Urinalysis Dipstick    Referral Orders  No referral(s) requested  today    Raliegh IpNatalie Kalyn Dimattia,  MSN, [name]-BC [facility] Cell [facility] [address]  [place], [place] [id number] [phone] fax   Problem List Items Addressed This Visit      Other   Anxiety   Frequent falls   S/P stroke due to cerebrovascular disease - Primary   Relevant Orders   CBC with Differential (Completed)   Vitamin D deficiency   Relevant Orders   Vitamin D, 25-hydroxy (Completed)    Other Visit Diagnoses    Abrasion of skin of left lower leg       Vitamin B12 deficiency       Relevant Orders   Vitamin B12 (Completed)   Abnormal urinalysis       Relevant Orders   Urine Culture   Health care maintenance       Relevant Orders   Urinalysis Dipstick (Completed)   Comprehensive metabolic panel (Completed)   Lipid Panel (Completed)   TSH (Completed)   PSA (Completed)   Follow up          No orders of the defined types were placed in this encounter.   Follow-up: No follow-ups on file.    Kallie LocksNatalie M Sydny Schnitzler, [name]

## 2020-06-11 NOTE — Progress Notes (Signed)
Integrated Behavioral Health Case Management Referral Note  06/11/2020 Name: Daniel Stuart MRN: [id number] DOB: 1966-03-17 Daniel Stuart is a 54 y.o. year old male who sees Kallie Locks, [name] for primary care. LCSW was consulted to assess patient's needs and assist the patient with Transportation Needs .  Interpreter: No.   Interpreter Name & Language: none  Assessment: Patient experiencing Transportation difficulties. Patient is enrolled with Medicaid transportation but has trouble in getting it scheduled in the required time before his appointments. Completed Access [place] application with patient (formerly [employer]). Also enrolled patient in [facility] transportation services to use for ride home today. Patient does live nearby the clinic and attempted to walk here this morning, but had a fall on the way.   Patient reports difficulty in getting his rolling walker down the front steps at his house. CSW to look into possible resources for ramps and follow up with patient.  Review of patient status, including review of consultants reports, relevant laboratory and other test results, and collaboration with appropriate care team members and the patient's provider was performed as part of comprehensive patient evaluation and provision of services.    SDOH (Social Determinants of Health) assessments performed: No    Outpatient Encounter Medications as of 06/11/2020  Medication Sig  . aspirin 81 MG EC tablet Take 1 tablet (81 mg total) by mouth daily.  Marland Kitchen atorvastatin (LIPITOR) 80 MG tablet Take 1 tablet by mouth every evening.  . gabapentin (NEURONTIN) 300 MG capsule TAKE 1 CAPSULE BY MOUTH BY MOUTH TWO TIMES DAILY  . oxybutynin (DITROPAN XL) 10 MG 24 hr tablet Take 1 tablet (10 mg total) by mouth at bedtime. (Patient not taking: Reported on 06/11/2020)  . Vitamin D, Ergocalciferol, (DRISDOL) 1.25 MG (50000 UT) CAPS capsule Take 1 capsule (50,000 Units total) by mouth every 7 (seven) days. (Patient not  taking: Reported on 06/11/2020)  . [DISCONTINUED] sulfamethoxazole-trimethoprim (BACTRIM DS) 800-160 MG tablet Take 1 tablet by mouth 2 (two) times daily.   No facility-administered encounter medications on file as of 06/11/2020.    Goals Addressed   None      Follow up Plan: 1. CSW to follow up with patient with additional resources  Abigail Butts, LCSW [facility] [phone]

## 2020-06-12 LAB — COMPREHENSIVE METABOLIC PANEL
ALT: 15 IU/L (ref 0–44)
AST: 15 IU/L (ref 0–40)
Albumin/Globulin Ratio: 1.3 (ref 1.2–2.2)
Albumin: 4.2 g/dL (ref 3.8–4.9)
Alkaline Phosphatase: 133 IU/L — ABNORMAL HIGH (ref 48–121)
BUN/Creatinine Ratio: 3 — ABNORMAL LOW (ref 9–20)
BUN: 4 mg/dL — ABNORMAL LOW (ref 6–24)
Bilirubin Total: 0.5 mg/dL (ref 0.0–1.2)
CO2: 22 mmol/L (ref 20–29)
Calcium: 9.1 mg/dL (ref 8.7–10.2)
Chloride: 103 mmol/L (ref 96–106)
Creatinine, Ser: 1.17 mg/dL (ref 0.76–1.27)
GFR calc Af Amer: 80 mL/min/{1.73_m2} (ref >59)
GFR calc non Af Amer: 69 mL/min/{1.73_m2} (ref >59)
Globulin, Total: 3.3 g/dL (ref 1.5–4.5)
Glucose: 95 mg/dL (ref 65–99)
Potassium: 3.7 mmol/L (ref 3.5–5.2)
Sodium: 140 mmol/L (ref 134–144)
Total Protein: 7.5 g/dL (ref 6.0–8.5)

## 2020-06-12 LAB — CBC WITH DIFFERENTIAL/PLATELET
Basophils Absolute: 0 10*3/uL (ref 0.0–0.2)
Basos: 0 %
EOS (ABSOLUTE): 0.1 10*3/uL (ref 0.0–0.4)
Eos: 1 %
Hematocrit: 44.9 % (ref 37.5–51.0)
Hemoglobin: 15.3 g/dL (ref 13.0–17.7)
Immature Grans (Abs): 0 10*3/uL (ref 0.0–0.1)
Immature Granulocytes: 0 %
Lymphocytes Absolute: 1.7 10*3/uL (ref 0.7–3.1)
Lymphs: 16 %
MCH: 31.7 pg (ref 26.6–33.0)
MCHC: 34.1 g/dL (ref 31.5–35.7)
MCV: 93 fL (ref 79–97)
Monocytes Absolute: 0.6 10*3/uL (ref 0.1–0.9)
Monocytes: 5 %
Neutrophils Absolute: 8.6 10*3/uL — ABNORMAL HIGH (ref 1.4–7.0)
Neutrophils: 78 %
Platelets: 230 10*3/uL (ref 150–450)
RBC: 4.83 x10E6/uL (ref 4.14–5.80)
RDW: 13.8 % (ref 11.6–15.4)
WBC: 11 10*3/uL — ABNORMAL HIGH (ref 3.4–10.8)

## 2020-06-12 LAB — LIPID PANEL
Chol/HDL Ratio: 2.6 ratio (ref 0.0–5.0)
Cholesterol, Total: 102 mg/dL (ref 100–199)
HDL: 39 mg/dL — ABNORMAL LOW (ref >39)
LDL Chol Calc (NIH): 49 mg/dL (ref 0–99)
Triglycerides: 61 mg/dL (ref 0–149)
VLDL Cholesterol Cal: 14 mg/dL (ref 5–40)

## 2020-06-12 LAB — TSH: TSH: 1.52 u[IU]/mL (ref 0.450–4.500)

## 2020-06-12 LAB — VITAMIN B12: Vitamin B-12: 287 pg/mL (ref 232–1245)

## 2020-06-12 LAB — PSA: Prostate Specific Ag, Serum: 0.8 ng/mL (ref 0.0–4.0)

## 2020-06-12 LAB — VITAMIN D 25 HYDROXY (VIT D DEFICIENCY, FRACTURES): Vit D, 25-Hydroxy: 15.8 ng/mL — ABNORMAL LOW (ref 30.0–100.0)

## 2020-06-13 LAB — URINE CULTURE: Organism ID, Bacteria: NO GROWTH

## 2020-06-16 ENCOUNTER — Other Ambulatory Visit: Payer: Self-pay

## 2020-06-16 ENCOUNTER — Encounter: Payer: Self-pay | Admitting: Cardiology

## 2020-06-16 ENCOUNTER — Ambulatory Visit (INDEPENDENT_AMBULATORY_CARE_PROVIDER_SITE_OTHER): Payer: Medicare Other | Admitting: Cardiology

## 2020-06-16 VITALS — BP 110/60 | HR 69 | Temp 97.4°F | Ht 66.0 in | Wt 134.0 lb

## 2020-06-16 DIAGNOSIS — Z8673 Personal history of transient ischemic attack (TIA), and cerebral infarction without residual deficits: Secondary | ICD-10-CM

## 2020-06-16 DIAGNOSIS — R072 Precordial pain: Secondary | ICD-10-CM | POA: Diagnosis not present

## 2020-06-16 DIAGNOSIS — I6310 Cerebral infarction due to embolism of unspecified precerebral artery: Secondary | ICD-10-CM

## 2020-06-16 DIAGNOSIS — Z720 Tobacco use: Secondary | ICD-10-CM

## 2020-06-16 DIAGNOSIS — I739 Peripheral vascular disease, unspecified: Secondary | ICD-10-CM | POA: Diagnosis not present

## 2020-06-16 DIAGNOSIS — E785 Hyperlipidemia, unspecified: Secondary | ICD-10-CM

## 2020-06-16 DIAGNOSIS — Z716 Tobacco abuse counseling: Secondary | ICD-10-CM | POA: Diagnosis not present

## 2020-06-16 DIAGNOSIS — F17210 Nicotine dependence, cigarettes, uncomplicated: Secondary | ICD-10-CM | POA: Diagnosis not present

## 2020-06-16 NOTE — Patient Instructions (Signed)

## 2020-06-16 NOTE — Progress Notes (Signed)
Cardiology Office Note:    Date:  06/16/2020   ID:  OHM DENTLER, DOB 05-25-1966, MRN [id number]  PCP:  Kallie Locks, [name]  Cardiologist:  Jodelle Red, MD  Referring MD: Kallie Locks, [name]   CC: new patient evaluation for arrhythmia  History of Present Illness:    Daniel Stuart is a 54 y.o. male with a hx of CVA, PAD, tobacco use, hyperlipidemia who is seen as a new consult at the request of Kallie Locks, [name] for the evaluation and management of arrhythmia.  Note from Raliegh Ip dated 06/11/20 reviewed. Also reviewed cardiology records. He had a loop recorder placed 04/13/2017 by Dr. Ladona Ridgel but has not been seen by cardiology since. The loop has been continued to be checked remotely.  Patient concerns today: No specific concerns today. ROS positive for occasional chest discomfort every few weeks, not exertional (actually feels more when sitting on the sofa), feels like heartburn. No clear aggravating or alleviating factors. Lasts only 5-10 minutes. No associated symptoms; no nausea/vomiting/diaphoresis. No clear relation to food.  Has not had any sensations that heart is fluttering or racing. Most recent [facility] report reviewed, no arrhythmia.  He is not sure specifically why he was referred. He did ask if the device needs to be removed at any point, discussed.  Still smoking, about 1 ppd at least. Has thought about quitting. We discussed cessation today.  On aspirin and atorvastatin, tolerating both. Can walk to his doctor's appts near [facility] (only about 3 blocks away). Doesn't need to stop, stays active.   Past Medical History:  Diagnosis Date  . Anxiety   . Arrhythmia as indication for cardiac pacemaker replacement 03/2020  . Frequent falls 03/2020  . History of loop recorder 04/2017  . Stroke (cerebrum) (HCC)   . Tobacco use   . Urinary frequency 09/2019  . Vitamin D deficiency     Past Surgical History:  Procedure Laterality Date  .  FOOT SURGERY    . LOOP RECORDER INSERTION N/A 04/13/2017   Procedure: Loop Recorder Insertion;  Surgeon: Marinus Maw, MD;  Location: [facility] INVASIVE CV LAB;  Service: Cardiovascular;  Laterality: N/A;  . TEE WITHOUT CARDIOVERSION N/A 04/13/2017   Procedure: TRANSESOPHAGEAL ECHOCARDIOGRAM (TEE);  Surgeon: Jake Bathe, MD;  Location: [facility] ENDOSCOPY;  Service: Cardiovascular;  Laterality: N/A;    Current Medications: Current Outpatient Medications on File Prior to Visit  Medication Sig  . aspirin 81 MG EC tablet Take 1 tablet (81 mg total) by mouth daily.  Marland Kitchen atorvastatin (LIPITOR) 80 MG tablet Take 1 tablet by mouth every evening.  . gabapentin (NEURONTIN) 300 MG capsule TAKE 1 CAPSULE BY MOUTH BY MOUTH TWO TIMES DAILY  . oxybutynin (DITROPAN XL) 10 MG 24 hr tablet Take 1 tablet (10 mg total) by mouth at bedtime. (Patient not taking: Reported on 06/11/2020)  . Vitamin D, Ergocalciferol, (DRISDOL) 1.25 MG (50000 UT) CAPS capsule Take 1 capsule (50,000 Units total) by mouth every 7 (seven) days. (Patient not taking: Reported on 06/11/2020)   No current facility-administered medications on file prior to visit.     Allergies:   Patient has no known allergies.   Social History   Tobacco Use  . Smoking status: Current Every Day Smoker    Packs/day: 1.00    Types: Cigarettes  . Smokeless tobacco: Never Used  Vaping Use  . Vaping Use: Never used  Substance Use Topics  . Alcohol use: No  . Drug use: No  Family History: family history includes Cancer in his sister; Diabetes in his father and mother; Heart failure in his father.  ROS:   Please see the history of present illness.  Additional pertinent ROS: Constitutional: Negative for chills, fever, night sweats, unintentional weight loss  HENT: Negative for ear pain and hearing loss.   Eyes: Negative for loss of vision and eye pain.  Respiratory: Negative for cough, sputum, wheezing.   Cardiovascular: See HPI. Gastrointestinal: Negative  for abdominal pain, melena, and hematochezia.  Genitourinary: Negative for dysuria and hematuria.  Musculoskeletal: Negative for falls and myalgias.  Skin: Negative for itching and rash.  Neurological: Negative for focal weakness, focal sensory changes and loss of consciousness.  Endo/Heme/Allergies: Does not bruise/bleed easily.     EKGs/Labs/Other Studies Reviewed:    The following studies were reviewed today: Echo TEE 04/13/2017 - Left ventricle: Systolic function was normal. The estimated  ejection fraction was in the range of 60% to 65%. Wall motion was  normal; there were no regional wall motion abnormalities.  - Aortic valve: There was mild regurgitation.  - Left atrium: No evidence of thrombus in the atrial cavity or  appendage. No evidence of thrombus in the atrial cavity or  appendage. No evidence of thrombus in the appendage.  - Right atrium: No evidence of thrombus in the atrial cavity or  appendage.  - Atrial septum: No defect or patent foramen ovale was identified.  Echo contrast study showed no right-to-left atrial level shunt,  at baseline or with provocation.   EKG:  EKG is personally reviewed.  The ekg ordered today demonstrates normal sinus rhythm at 69 bpm  Recent Labs: 06/11/2020: ALT 15; BUN 4; Creatinine, Ser 1.17; Hemoglobin 15.3; Platelets 230; Potassium 3.7; Sodium 140; TSH 1.520  Recent Lipid Panel    Component Value Date/Time   CHOL 102 06/11/2020 1041   TRIG 61 06/11/2020 1041   HDL 39 (L) 06/11/2020 1041   CHOLHDL 2.6 06/11/2020 1041   CHOLHDL 5.0 04/12/2017 0552   VLDL 15 04/12/2017 0552   LDLCALC 49 06/11/2020 1041    Physical Exam:    VS:  BP 110/60   Pulse 69   Temp (!) 97.4 F (36.3 C)   Ht 5\' 6"  (1.676 m)   Wt 134 lb (60.8 kg)   SpO2 97%   BMI 21.63 kg/m     Wt Readings from Last 3 Encounters:  06/16/20 134 lb (60.8 kg)  06/11/20 134 lb (60.8 kg)  03/11/20 138 lb 9.6 oz (62.9 kg)    GEN: Well nourished, well  developed in no acute distress HEENT: Normal, moist mucous membranes NECK: No JVD CARDIAC: regular rhythm, normal S1 and S2, no rubs or gallops. No murmurs. VASCULAR: Radial and DP pulses 2+ bilaterally. No carotid bruits RESPIRATORY:  Clear to auscultation without rales, wheezing or rhonchi  ABDOMEN: Soft, non-tender, non-distended MUSCULOSKELETAL:  Ambulates independently SKIN: Warm and dry, no edema NEUROLOGIC:  Alert and oriented x 3. No focal neuro deficits noted. PSYCHIATRIC:  Normal affect    ASSESSMENT:    1. History of CVA (cerebrovascular accident)   2. Cerebrovascular accident (CVA) due to embolism of precerebral artery (HCC)   3. PAD (peripheral artery disease) (HCC)   4. Tobacco abuse   5. Hyperlipidemia, unspecified hyperlipidemia type   6. Precordial pain   7. Tobacco abuse counseling    PLAN:    Reported arrhythmia: I am unclear as to where this was found. I reviewed his loop recorder checks (placed for  stroke) for all of 2021 and cannot find any concern for arrhythmia -has a loop recorder in place, we discussed this at length today. Monitored by the device clinic  PAD, history of CVA, hyperlipidemia: -discussed tobacco cessation, below -continue aspirin, atorvastatin -goal LDL <70  Intermittent precordial pain: rare, nonexertional, brief. He relates this to heartburn -instructed on red flag warning signs that need immediate medical attention  Tobacco abuse: The patient was counseled on tobacco cessation today for 3 minutes.  Counseling included reviewing the risks of smoking tobacco products, how it impacts the patient's current medical diagnoses and different strategies for quitting.  Pharmacotherapy to aid in tobacco cessation was not prescribed today.  Cardiac risk counseling and prevention recommendations: -recommend heart healthy/Mediterranean diet, with whole grains, fruits, vegetable, fish, lean meats, nuts, and olive oil. Limit salt. -recommend moderate  walking, 3-5 times/week for 30-50 minutes each session. Aim for at least 150 minutes.week. Goal should be pace of 3 miles/hours, or walking 1.5 miles in 30 minutes -recommend avoidance of tobacco products. Avoid excess alcohol. -ASCVD risk score: The ASCVD Risk score Denman George DC Jr., et al., 2013) failed to calculate for the following reasons:   The patient has a prior MI or stroke diagnosis    Plan for follow up: 1 year  Jodelle Red, MD, PhD [place]  [facility]    Medication Adjustments/Labs and Tests Ordered: Current medicines are reviewed at length with the patient today.  Concerns regarding medicines are outlined above.  Orders Placed This Encounter  Procedures  . EKG 12-Lead   No orders of the defined types were placed in this encounter.   Patient Instructions  Medication Instructions:  Your Physician recommend you continue on your current medication as directed.    *If you need a refill on your cardiac medications before your next appointment, please call your pharmacy*   Lab Work: None   Testing/Procedures: None   Follow-Up: At [facility], you and your health needs are our priority.  As part of our continuing mission to provide you with exceptional heart care, we have created designated Provider Care Teams.  These Care Teams include your primary Cardiologist (physician) and Advanced Practice Providers (APPs -  Physician Assistants and Nurse Practitioners) who all work together to provide you with the care you need, when you need it.  We recommend signing up for the patient portal called "MyChart".  Sign up information is provided on this After Visit Summary.  MyChart is used to connect with patients for Virtual Visits (Telemedicine).  Patients are able to view lab/test results, encounter notes, upcoming appointments, etc.  Non-urgent messages can be sent to your provider as well.   To learn more about what you can do with MyChart, go to  [email or web address].    Your next appointment:   1 year(s)  The format for your next appointment:   In Person  Provider:   Jodelle Red, MD      Signed, Jodelle Red, MD PhD 06/16/2020    [facility]

## 2020-06-17 ENCOUNTER — Other Ambulatory Visit: Payer: Self-pay | Admitting: Family Medicine

## 2020-06-17 DIAGNOSIS — N3000 Acute cystitis without hematuria: Secondary | ICD-10-CM

## 2020-06-17 MED ORDER — SULFAMETHOXAZOLE-TRIMETHOPRIM 800-160 MG PO TABS: 1.0000 | ORAL_TABLET | Freq: Two times a day (BID) | ORAL | 0 refills | Status: DC

## 2020-06-17 MED FILL — SULFAMETHOXAZOLE-TMP DS TAB: 800-160 | 7 days supply | Qty: 14 | Fill #0

## 2020-06-18 DIAGNOSIS — I6310 Cerebral infarction due to embolism of unspecified precerebral artery: Secondary | ICD-10-CM

## 2020-06-20 ENCOUNTER — Telehealth: Payer: Self-pay | Admitting: Clinical

## 2020-06-20 NOTE — Telephone Encounter (Signed)
Integrated Behavioral Health General Follow Up Note  06/20/2020 Name: NAINOA WOLDT MRN: [id number] DOB: [birth year] NARCISO STOUTENBURG is a 54 y.o. year old male who sees Kallie Locks, [name] for primary care. LCSW was initially consulted to assist the patient with Transportation Needs  Interpreter: No.   Interpreter Name & Language: none  Ongoing Intervention: Patient experiencing transportation needs. Patient presented for PCP visit last week and CSW completed [employer] application with him for assistance with transportation. During assessment it was also found that patient and his mother who lives with him use rolling walkers due to history of stroke. They have stairs up to the front door of their house and it is difficult for them to navigate these stairs. Today CSW called patient and discussed resources for having ramp installed at the house. Will send patient this information via mail. CSW available to assist in following up on these resources.  Review of patient status, including review of consultants reports, relevant laboratory and other test results, and collaboration with appropriate care team members and the patient's provider was performed as part of comprehensive patient evaluation and provision of services.     Follow up Plan: 1. CSW available from clinic as needed.  Abigail Butts, LCSW [facility] [phone]

## 2020-06-22 LAB — CUP PACEART REMOTE DEVICE CHECK
Date Time Interrogation Session: 20210818232729
Implantable Pulse Generator Implant Date: 20180613

## 2020-06-23 ENCOUNTER — Other Ambulatory Visit: Payer: Self-pay | Admitting: Family Medicine

## 2020-06-23 ENCOUNTER — Ambulatory Visit (INDEPENDENT_AMBULATORY_CARE_PROVIDER_SITE_OTHER): Payer: Medicare Other | Admitting: *Deleted

## 2020-06-23 ENCOUNTER — Encounter: Payer: Self-pay | Admitting: Family Medicine

## 2020-06-23 DIAGNOSIS — R296 Repeated falls: Secondary | ICD-10-CM

## 2020-06-23 DIAGNOSIS — Z8673 Personal history of transient ischemic attack (TIA), and cerebral infarction without residual deficits: Secondary | ICD-10-CM

## 2020-06-23 DIAGNOSIS — E559 Vitamin D deficiency, unspecified: Secondary | ICD-10-CM

## 2020-06-23 DIAGNOSIS — I6310 Cerebral infarction due to embolism of unspecified precerebral artery: Secondary | ICD-10-CM

## 2020-06-23 MED ORDER — VITAMIN D (ERGOCALCIFEROL) 1.25 MG (50000 UNIT) PO CAPS: 50000.0000 [IU] | ORAL_CAPSULE | ORAL | 6 refills | Status: DC

## 2020-06-23 MED FILL — VIT D2 1.25 MG (50,000 UNIT: 1.25 MG | 35 days supply | Qty: 5 | Fill #0

## 2020-06-24 ENCOUNTER — Telehealth: Payer: Self-pay | Admitting: Clinical

## 2020-06-24 NOTE — Telephone Encounter (Signed)
Integrated Behavioral Health General Follow Up Note  06/24/2020 Name: ELMAN DETTMAN MRN: [id number] DOB: 1966/01/09 LEELYN JASINSKI is a 54 y.o. year old male who sees Kallie Locks, [name] for primary care. LCSW was initially consulted to assist the patient with Transportation Needs  Interpreter: No.   Interpreter Name & Language: none  Ongoing Intervention: Patient experiencing mobility challenges. Today CSW followed up with patient to provide additional information on organizations that will assist with ramp installation.   Review of patient status, including review of consultants reports, relevant laboratory and other test results, and collaboration with appropriate care team members and the patient's provider was performed as part of comprehensive patient evaluation and provision of services.     Follow up Plan: 1. CSW available from clinic as needed.  Abigail Butts, LCSW [facility] [phone]

## 2020-06-26 NOTE — Progress Notes (Signed)
Carelink Summary Report / Loop Recorder 

## 2020-06-27 ENCOUNTER — Telehealth: Payer: Self-pay | Admitting: Clinical

## 2020-06-27 NOTE — Telephone Encounter (Signed)
Integrated Behavioral Health General Follow Up Note  06/27/2020 Name: CABE LASHLEY MRN: [id number] DOB: Nov 27, 1965 TYCEN DOCKTER is a 54 y.o. year old male who sees Kallie Locks, [name] for primary care. LCSW was initially consulted to assist the patient with Transportation Needs.  Interpreter: No.   Interpreter Name & Language: none  Ongoing Intervention: Patient experiencing mobility challenges. Today patient called CSW and requested information on accessible housing. CSW had previously discussed resources for ramp installation with patient, however patient indicated he and his mother plan to move to a more accessible apartment, as their bathroom is also difficult to navigate with wheelchair/walker. They have a [address] [employer] voucher. Patient's sister is assisting in the search. It is difficult for patient to write information down due to history of stroke. CSW offered to call patient's sister and provide the information. Left voicemail for sister. CSW will continue to be available for support as needed.  Review of patient status, including review of consultants reports, relevant laboratory and other test results, and collaboration with appropriate care team members and the patient's provider was performed as part of comprehensive patient evaluation and provision of services.     Follow up Plan: 1. CSW available from clinic as needed  Abigail Butts, LCSW [facility] [phone]

## 2020-07-01 ENCOUNTER — Telehealth: Payer: Self-pay | Admitting: Clinical

## 2020-07-01 NOTE — Telephone Encounter (Signed)
Integrated Behavioral Health General Follow Up Note  07/01/2020 Name: Daniel Stuart MRN: [id number] DOB: 1966-02-20 Daniel Stuart is a 54 y.o. year old male who sees Kallie Locks, [name] for primary care. LCSW was initially consulted to assist the patient with Transportation Needs.  Interpreter: No.   Interpreter Name & Language: none  Ongoing Intervention: Patient experiencing mobility challenges. Today received return call from patient's sister, [name], and reviewed housing search resources. Learned from patient and sister today that patient will need to move to separate housing from his mother, so sister helping to find them both a unit. Also spoke to patient and advised of the open waiting list for project based voucher through the [employer]. Additionally, sent referral through [employer] to the [facility] for assistance with permament housing. CSW will continue to be available for support as needed.  Review of patient status, including review of consultants reports, relevant laboratory and other test results, and collaboration with appropriate care team members and the patient's provider was performed as part of comprehensive patient evaluation and provision of services.     Follow up Plan: 1. CSW available from clinic as needed  Abigail Butts, LCSW [facility] [phone]

## 2020-07-16 MED FILL — GABAPENTIN 300 MG CAPSULE: 300 | 90 days supply | Qty: 180 | Fill #3

## 2020-07-16 MED FILL — ATORVASTATIN CALCIUM 80 MG: 80 | 30 days supply | Qty: 30 | Fill #2

## 2020-07-19 DIAGNOSIS — I6310 Cerebral infarction due to embolism of unspecified precerebral artery: Secondary | ICD-10-CM

## 2020-07-28 ENCOUNTER — Ambulatory Visit (INDEPENDENT_AMBULATORY_CARE_PROVIDER_SITE_OTHER): Payer: Medicare Other | Admitting: Emergency Medicine

## 2020-07-28 DIAGNOSIS — I6310 Cerebral infarction due to embolism of unspecified precerebral artery: Secondary | ICD-10-CM

## 2020-07-28 LAB — CUP PACEART REMOTE DEVICE CHECK
Date Time Interrogation Session: 20210918232233
Implantable Pulse Generator Implant Date: 20180613

## 2020-07-30 NOTE — Progress Notes (Signed)
Carelink Summary Report / Loop Recorder 

## 2020-08-18 ENCOUNTER — Telehealth: Payer: Self-pay | Admitting: Clinical

## 2020-08-18 NOTE — Telephone Encounter (Signed)
Integrated Behavioral Health General Follow Up Note  08/18/2020 Name: Daniel Stuart MRN: [id number] DOB: 11/03/1965 Daniel Stuart is a 54 y.o. year old male who sees Kallie Locks, [name] for primary care. LCSW was initially consulted to assist the patient with Transportation Needs.  Interpreter: No.   Interpreter Name & Language: none  Assessment: Patient experiencing mobility challenges and also in need of permanent housing. Currently lives with his mother but mother needs to move due to her own needs. Referred patient to [employer] [facility]) several weeks ago for assistance. Patient called and left voicemail on 10/14 while CSW out of office, requesting assistance in obtaining new copy of his birth certificate and social security award letter.   Ongoing Intervention: Today CSW returned patient's call. Patient indicated he has an appointment at the [facility] tomorrow for caseworker to assist with housing applications. Planned for patient to request assistance in getting these documents from this caseworker. If caseworker unable to assist, CSW will follow up and provide patient support in obtaining these documents.   Review of patient status, including review of consultants reports, relevant laboratory and other test results, and collaboration with appropriate care team members and the patient's provider was performed as part of comprehensive patient evaluation and provision of services.     Follow up Plan: 1. Patient to attend appointment with caseworker. 2. CSW to follow up and assist in obtaining supporting documents as needed.  Abigail Butts, LCSW [facility] [phone]

## 2020-08-19 ENCOUNTER — Other Ambulatory Visit: Payer: Self-pay | Admitting: Family Medicine

## 2020-08-19 DIAGNOSIS — E785 Hyperlipidemia, unspecified: Secondary | ICD-10-CM

## 2020-08-20 ENCOUNTER — Ambulatory Visit (INDEPENDENT_AMBULATORY_CARE_PROVIDER_SITE_OTHER): Payer: Medicare Other

## 2020-08-20 DIAGNOSIS — I6310 Cerebral infarction due to embolism of unspecified precerebral artery: Secondary | ICD-10-CM | POA: Diagnosis not present

## 2020-08-23 LAB — CUP PACEART REMOTE DEVICE CHECK
Date Time Interrogation Session: 20211019233317
Implantable Pulse Generator Implant Date: 20180613

## 2020-08-26 NOTE — Progress Notes (Signed)
Carelink Summary Report / Loop Recorder 

## 2020-08-27 ENCOUNTER — Other Ambulatory Visit: Payer: Self-pay | Admitting: Family Medicine

## 2020-08-28 MED FILL — ATORVASTATIN CALCIUM 80 MG: 80 | 30 days supply | Qty: 30 | Fill #0

## 2020-09-20 LAB — CUP PACEART REMOTE DEVICE CHECK
Date Time Interrogation Session: 20211119223558
Implantable Pulse Generator Implant Date: 20180613

## 2020-09-22 ENCOUNTER — Ambulatory Visit (INDEPENDENT_AMBULATORY_CARE_PROVIDER_SITE_OTHER): Payer: Medicare Other

## 2020-09-22 DIAGNOSIS — I6310 Cerebral infarction due to embolism of unspecified precerebral artery: Secondary | ICD-10-CM | POA: Diagnosis not present

## 2020-09-23 NOTE — Progress Notes (Signed)
Carelink Summary Report / Loop Recorder 

## 2020-09-30 ENCOUNTER — Telehealth: Payer: Self-pay | Admitting: Clinical

## 2020-09-30 NOTE — Telephone Encounter (Signed)
Integrated Behavioral Health General Follow Up Note  09/30/2020 Name: HARSHAL SIRMON MRN: [id number] DOB: May 11, 1966 DERRILL BAGNELL is a 54 y.o. year old male who sees Kallie Locks, [name] for primary care. LCSW was initially consulted to assist the patient with Transportation Needs. Have also followed to provide referrals and information on housing resources.   Interpreter: No.   Interpreter Name & Language: none  Assessment: Patient experiencing mobility challenges and also in need of permanent housing. Patient's sister is assisting him in applying for permanent housing. He needs new copies of personal documents to accompany these applications.   Ongoing Intervention: Today CSW called patient to follow up on referrals to housing resources. Patient reported his sister is helping him with housing applications. He did not follow up with caseworker at the [facility]). He got a new copy of his disability award letter, but still needs a new copy of his birth certificate from [place]. CSW advised patient of the process of applying for a new birth certificate copy in [place] and offered to provide patient with copy of the application. Patient has an appointment with his PCP [facility]) next week. He will pick up the application at that time. CSW to follow.  Review of patient status, including review of consultants reports, relevant laboratory and other test results, and collaboration with appropriate care team members and the patient's provider was performed as part of comprehensive patient evaluation and provision of services.     Follow up Plan: 1. CSW to provide birth certificate application and additional housing information at patient's next PCP appointment 10/07/20.  Abigail Butts, LCSW [facility] [phone]

## 2020-10-07 ENCOUNTER — Other Ambulatory Visit: Payer: Self-pay

## 2020-10-07 ENCOUNTER — Ambulatory Visit (INDEPENDENT_AMBULATORY_CARE_PROVIDER_SITE_OTHER): Payer: Medicare Other | Admitting: Family Medicine

## 2020-10-07 ENCOUNTER — Other Ambulatory Visit: Payer: Self-pay | Admitting: Family Medicine

## 2020-10-07 ENCOUNTER — Encounter: Payer: Self-pay | Admitting: Family Medicine

## 2020-10-07 VITALS — BP 118/76 | HR 81 | Temp 98.3°F | Resp 18 | Ht 66.0 in | Wt 130.2 lb

## 2020-10-07 DIAGNOSIS — Z09 Encounter for follow-up examination after completed treatment for conditions other than malignant neoplasm: Secondary | ICD-10-CM

## 2020-10-07 DIAGNOSIS — Z23 Encounter for immunization: Secondary | ICD-10-CM | POA: Diagnosis not present

## 2020-10-07 DIAGNOSIS — F419 Anxiety disorder, unspecified: Secondary | ICD-10-CM

## 2020-10-07 DIAGNOSIS — E559 Vitamin D deficiency, unspecified: Secondary | ICD-10-CM

## 2020-10-07 DIAGNOSIS — R350 Frequency of micturition: Secondary | ICD-10-CM | POA: Diagnosis not present

## 2020-10-07 DIAGNOSIS — Z8673 Personal history of transient ischemic attack (TIA), and cerebral infarction without residual deficits: Secondary | ICD-10-CM | POA: Diagnosis not present

## 2020-10-07 DIAGNOSIS — R296 Repeated falls: Secondary | ICD-10-CM

## 2020-10-07 DIAGNOSIS — R531 Weakness: Secondary | ICD-10-CM | POA: Diagnosis not present

## 2020-10-07 MED ORDER — OXYBUTYNIN CHLORIDE ER 10 MG PO TB24: 10.0000 mg | ORAL_TABLET | Freq: Every day | ORAL | 3 refills | Status: DC

## 2020-10-07 MED ORDER — VITAMIN D (ERGOCALCIFEROL) 1.25 MG (50000 UNIT) PO CAPS: 50000.0000 [IU] | ORAL_CAPSULE | ORAL | 6 refills | Status: DC

## 2020-10-07 MED FILL — OXYBUTYNIN CL ER 10 MG TAB: 10 | 90 days supply | Qty: 90 | Fill #0

## 2020-10-07 MED FILL — VIT D2 1.25 MG (50,000 UNIT: 1.25 MG | 35 days supply | Qty: 5 | Fill #0

## 2020-10-07 NOTE — Progress Notes (Signed)
Reports has been doubling up on gabapentin to help w/ R leg pain   Needs to follow-up on referral for depression   Flu vaccine today

## 2020-10-07 NOTE — Progress Notes (Signed)
[facility]    Established Patient Office Visit  Subjective:  Patient ID: Daniel Stuart, male    DOB: 05-Jun-1966  Age: 54 y.o. MRN: [id number]  CC:  Chief Complaint  Patient presents with  . Follow-up    HPI Daniel Stuart is a 54 year old male who presents for Follow Up today.     Patient Active Problem List   Diagnosis Date Noted  . Frequent falls 03/11/2020  . Arrhythmia as indication for cardiac pacemaker replacement 03/11/2020  . Urinary frequency 03/11/2020  . Vitamin D deficiency 09/13/2019  . S/P stroke due to cerebrovascular disease 03/12/2019  . Right sided weakness 03/12/2019  . Anxiety 03/12/2019  . Hyperlipidemia   . Tobacco abuse 04/12/2017  . Dyslipidemia 04/12/2017  . PVD (peripheral vascular disease) (HCC) 04/12/2017  . Carotid arterial disease (HCC) 04/12/2017  . CVA (cerebral vascular accident) (HCC) 04/11/2017    Current Status: Since his last office visit, he is doing well with no complaints. He recently relocated to live with his sister, since his mother has been required to have SNF placement. He states that his sister's home has carpet and stairs, which hinders his ambulatory issues. He has right-sided weakness. He has not had any recent falls. He uses cane/walker for assistance with ambulating safely. He denies fevers, chills, fatigue, recent infections, weight loss, and night sweats. He has not had any headaches, visual changes, dizziness, and falls. No chest pain, heart palpitations, cough and shortness of breath reported. Denies GI problems such as nausea, vomiting, diarrhea, and constipation. He has no reports of blood in stools, dysuria and hematuria. He is taking all medications as prescribed. He denies pain today.  Past Medical History:  Diagnosis Date  . Anxiety   . Arrhythmia as indication for cardiac pacemaker replacement 03/2020  . Frequent falls 03/2020  . History of loop recorder  04/2017  . Stroke (cerebrum) (HCC)   . Tobacco use   . Urinary frequency 09/2019  . Vitamin D deficiency     Past Surgical History:  Procedure Laterality Date  . FOOT SURGERY    . LOOP RECORDER INSERTION N/A 04/13/2017   Procedure: Loop Recorder Insertion;  Surgeon: Marinus Maw, MD;  Location: [facility] IN[facility]ASIVE [facility];  Service: Cardiovascular;  Laterality: N/A;  . TEE WITHOUT CARDIOVERSION N/A 04/13/2017   Procedure: TRANSESOPHAGEAL ECHOCARDIOGRAM (TEE);  Surgeon: Jake Bathe, MD;  Location: [facility] ENDOSCOPY;  Service: Cardiovascular;  Laterality: N/A;    Family History  Problem Relation Age of Onset  . Diabetes Mother   . Diabetes Father   . Heart failure Father   . Cancer Sister     Social History   Socioeconomic History  . Marital status: Legally Separated    Spouse name: Not on file  . Number of children: 1  . Years of education: Not on file  . Highest education level: Some college, no degree  Occupational History  . Occupation: disabled  Tobacco Use  . Smoking status: Current Every Day Smoker    Packs/day: 1.00    Types: Cigarettes  . Smokeless tobacco: Never Used  Vaping Use  . Vaping Use: Never used  Substance and Sexual Activity  . Alcohol use: No  . Drug use: No  . Sexual activity: Not Currently    Partners: Female  Other Topics Concern  . Not on file  Social History Narrative   Patient is right-handed. He lives in a one level home  with his mother since CVA. He drinks one cup of coffee and 2-3 sodas a day. He walks when weather permitting.   Social Determinants of Health   Financial Resource Strain:   . Difficulty of Paying Living Expenses: Not on file  Food Insecurity:   . Worried About Programme researcher, broadcasting/film/videounning Out of Food in the Last Year: Not on file  . Ran Out of Food in the Last Year: Not on file  Transportation Needs:   . Lack of Transportation (Medical): Not on file  . Lack of Transportation (Non-Medical): Not on file  Physical Activity:   . Days of Exercise  per Week: Not on file  . Minutes of Exercise per Session: Not on file  Stress:   . Feeling of Stress : Not on file  Social Connections:   . Frequency of Communication with Friends and Family: Not on file  . Frequency of Social Gatherings with Friends and Family: Not on file  . Attends Religious Services: Not on file  . Active Member of Clubs or Organizations: Not on file  . Attends BankerClub or Organization Meetings: Not on file  . Marital Status: Not on file  Intimate Partner Violence:   . Fear of Current or Ex-Partner: Not on file  . Emotionally Abused: Not on file  . Physically Abused: Not on file  . Sexually Abused: Not on file    Outpatient Medications Prior to Visit  Medication Sig Dispense Refill  . atorvastatin (LIPITOR) 80 MG tablet TAKE 1 TABLET BY MOUTH EVERY EVENING. 30 tablet 6  . gabapentin (NEURONTIN) 300 MG capsule TAKE 1 CAPSULE BY MOUTH BY MOUTH TWO TIMES DAILY 180 capsule 6  . aspirin 81 MG EC tablet Take 1 tablet (81 mg total) by mouth daily. (Patient not taking: Reported on 10/07/2020) 30 tablet 3  . sulfamethoxazole-trimethoprim (BACTRIM DS) 800-160 MG tablet Take 1 tablet by mouth 2 (two) times daily. (Patient not taking: Reported on 10/07/2020) 14 tablet 0  . oxybutynin (DITROPAN XL) 10 MG 24 hr tablet Take 1 tablet (10 mg total) by mouth at bedtime. (Patient not taking: Reported on 06/11/2020) 30 tablet 6  . Vitamin D, Ergocalciferol, (DRISDOL) 1.25 MG (50000 UNIT) CAPS capsule Take 1 capsule (50,000 Units total) by mouth every 7 (seven) days. (Patient not taking: Reported on 10/07/2020) 5 capsule 6   No facility-administered medications prior to visit.    No Known Allergies  ROS Review of Systems  Constitutional: Negative.   HENT: Negative.   Eyes: Negative.   Respiratory: Negative.   Cardiovascular: Negative.   Gastrointestinal: Negative.   Endocrine: Negative.   Genitourinary: Positive for urgency.  Musculoskeletal: Negative.   Skin: Negative.    Allergic/Immunologic: Negative.   Neurological: Positive for dizziness (occasional ), weakness (right sided. ) and headaches (occasional ).  Hematological: Negative.   Psychiatric/Behavioral: The patient is nervous/anxious.       Objective:    Physical Exam Vitals and nursing note reviewed.  Constitutional:      Appearance: Normal appearance.  HENT:     Head: Normocephalic and atraumatic.     Nose: Nose normal.     Mouth/Throat:     Mouth: Mucous membranes are moist.     Pharynx: Oropharynx is clear.  Cardiovascular:     Rate and Rhythm: Normal rate and regular rhythm.     Pulses: Normal pulses.     Heart sounds: Normal heart sounds.  Pulmonary:     Effort: Pulmonary effort is normal.     Breath  sounds: Normal breath sounds.  Abdominal:     General: Bowel sounds are normal.     Palpations: Abdomen is soft.  Musculoskeletal:        General: Normal range of motion.     Cervical back: Normal range of motion and neck supple.  Skin:    General: Skin is warm and dry.  Neurological:     General: No focal deficit present.     Mental Status: He is alert and oriented to person, place, and time.  Psychiatric:     Comments: Sadness; increase anxiety.      BP 118/76 (BP Location: Left Arm, Patient Position: Sitting, Cuff Size: Normal)   Pulse 81   Temp 98.3 F (36.8 C) (Temporal)   Resp 18   Ht 5\' 6"  (1.676 m)   Wt 130 lb 3.2 oz (59.1 kg)   SpO2 100%   BMI 21.01 kg/m  Wt Readings from Last 3 Encounters:  10/07/20 130 lb 3.2 oz (59.1 kg)  06/16/20 134 lb (60.8 kg)  06/11/20 134 lb (60.8 kg)     Health Maintenance Due  Topic Date Due  . Hepatitis C Screening  Never done  . COLONOSCOPY  Never done  . INFLUENZA VACCINE  06/01/2020    There are no preventive care reminders to display for this patient.  Lab Results  Component Value Date   TSH 1.520 06/11/2020   Lab Results  Component Value Date   WBC 11.0 (H) 06/11/2020   HGB 15.3 06/11/2020   HCT 44.9  06/11/2020   MCV 93 06/11/2020   PLT 230 06/11/2020   Lab Results  Component Value Date   NA 140 06/11/2020   K 3.7 06/11/2020   CO2 22 06/11/2020   GLUCOSE 95 06/11/2020   BUN 4 (L) 06/11/2020   CREATININE 1.17 06/11/2020   BILITOT 0.5 06/11/2020   ALKPHOS 133 (H) 06/11/2020   AST 15 06/11/2020   ALT 15 06/11/2020   PROT 7.5 06/11/2020   ALBUMIN 4.2 06/11/2020   CALCIUM 9.1 06/11/2020   ANIONGAP 10 04/13/2017   Lab Results  Component Value Date   CHOL 102 06/11/2020   Lab Results  Component Value Date   HDL 39 (L) 06/11/2020   Lab Results  Component Value Date   LDLCALC 49 06/11/2020   Lab Results  Component Value Date   TRIG 61 06/11/2020   Lab Results  Component Value Date   CHOLHDL 2.6 06/11/2020   Lab Results  Component Value Date   HGBA1C 5.3 03/11/2020   Assessment & Plan:   1. S/P stroke due to cerebrovascular disease - Ambulatory referral to Home Health  2. Right sided weakness - Ambulatory referral to Home Health  3. Frequent falls - Ambulatory referral to Home Health  4. Urine frequency We will send refill of Oxybutynin (Ditropan)  5. Anxiety Stable today.  - Ambulatory referral to Home Health  6. Urinary frequency We will send refill for Ditropan today.  - oxybutynin (DITROPAN XL) 10 MG 24 hr tablet; Take 1 tablet (10 mg total) by mouth at bedtime.  Dispense: 90 tablet; Refill: 3  7. Flu vaccine need - Flu Vaccine QUAD 6+ mos PF IM (Fluarix Quad PF)  8. Vitamin D deficiency He will continue Vitamin D as prescribed.  - Vitamin D, Ergocalciferol, (DRISDOL) 1.25 MG (50000 UNIT) CAPS capsule; Take 1 capsule (50,000 Units total) by mouth every 7 (seven) days.  Dispense: 5 capsule; Refill: 6  9. Follow up He will follow  up in 3 months.   Meds ordered this encounter  Medications  . oxybutynin (DITROPAN XL) 10 MG 24 hr tablet    Sig: Take 1 tablet (10 mg total) by mouth at bedtime.    Dispense:  90 tablet    Refill:  3  . Vitamin  D, Ergocalciferol, (DRISDOL) 1.25 MG (50000 UNIT) CAPS capsule    Sig: Take 1 capsule (50,000 Units total) by mouth every 7 (seven) days.    Dispense:  5 capsule    Refill:  6    Orders Placed This Encounter  Procedures  . Flu Vaccine QUAD 6+ mos PF IM (Fluarix Quad PF)  . Ambulatory referral to Home Health    refills  Raliegh Ip, MSN, ANE, [name]-BC [facility] [address] [place], [place] [address] [phone] fax   Problem List Items Addressed This Visit      Nervous and Auditory   Right sided weakness   Relevant Orders   Ambulatory referral to Home Health     Other   Anxiety   Relevant Orders   Ambulatory referral to Home Health   Frequent falls   Relevant Orders   Ambulatory referral to Home Health   S/P stroke due to cerebrovascular disease - Primary   Relevant Orders   Ambulatory referral to Home Health   Urinary frequency   Relevant Medications   oxybutynin (DITROPAN XL) 10 MG 24 hr tablet   Vitamin D deficiency   Relevant Medications   Vitamin D, Ergocalciferol, (DRISDOL) 1.25 MG (50000 UNIT) CAPS capsule    Other Visit Diagnoses    Urine frequency       Flu vaccine need       Relevant Orders   Flu Vaccine QUAD 6+ mos PF IM (Fluarix Quad PF)   Follow up          Meds ordered this encounter  Medications  . oxybutynin (DITROPAN XL) 10 MG 24 hr tablet    Sig: Take 1 tablet (10 mg total) by mouth at bedtime.    Dispense:  90 tablet    Refill:  3  . Vitamin D, Ergocalciferol, (DRISDOL) 1.25 MG (50000 UNIT) CAPS capsule    Sig: Take 1 capsule (50,000 Units total) by mouth every 7 (seven) days.    Dispense:  5 capsule    Refill:  6    Follow-up: Return in about 3 months (around 01/05/2021).    Kallie Locks, [name]

## 2020-10-07 NOTE — Progress Notes (Signed)
[facility] General Follow Up Note  10/07/2020 Name: Daniel Stuart MRN: [id number] DOB: 14-Sep-1966 Daniel Stuart is a 54 y.o. year old male who sees Azzie Glatter, [name] for primary care. LCSW was initially consulted to assist the patient with Transportation Needs. Have also followed to provide referrals and information on housing resources.   Interpreter: No.   Interpreter Name & Language: none  Assessment: Patient experiencing mobility challenges and also in need of permanent housing. Patient's sister is assisting him in applying for permanent housing. He needs new copies of personal documents to accompany these applications.   Ongoing Intervention: CSW met with patient during PCP visit. Provided patient with information on [employer] waiting list that he is eligible for. Patient reported his sister continues to help him with housing applications and they are looking at an apartment today.   Patient requires new copy of his birth certificate. Provided patient with application for new birth certificate from [place]. Also provided information on how to order online or over the phone.  Review of patient status, including review of consultants reports, relevant laboratory and other test results, and collaboration with appropriate care team members and the patient's provider was performed as part of comprehensive patient evaluation and provision of services.     Follow up Plan: 1. CSW available from clinic as needed.   Estanislado Emms, [facility] [phone]

## 2020-10-09 ENCOUNTER — Encounter: Payer: Self-pay | Admitting: Family Medicine

## 2020-10-15 ENCOUNTER — Telehealth: Payer: Self-pay | Admitting: Family Medicine

## 2020-10-16 ENCOUNTER — Other Ambulatory Visit: Payer: Self-pay | Admitting: Family Medicine

## 2020-10-16 DIAGNOSIS — R531 Weakness: Secondary | ICD-10-CM

## 2020-10-16 MED ORDER — GABAPENTIN 300 MG PO CAPS: ORAL_CAPSULE | ORAL | 3 refills | Status: DC

## 2020-10-16 NOTE — Telephone Encounter (Signed)
Sent to provider 

## 2020-10-17 MED FILL — GABAPENTIN 300 MG CAPSULE: 300 | 90 days supply | Qty: 180 | Fill #0

## 2020-10-21 ENCOUNTER — Ambulatory Visit (INDEPENDENT_AMBULATORY_CARE_PROVIDER_SITE_OTHER): Payer: Medicare Other

## 2020-10-21 DIAGNOSIS — I6310 Cerebral infarction due to embolism of unspecified precerebral artery: Secondary | ICD-10-CM | POA: Diagnosis not present

## 2020-10-21 LAB — CUP PACEART REMOTE DEVICE CHECK
Date Time Interrogation Session: 20211220223405
Implantable Pulse Generator Implant Date: 20180613
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
## 2020-10-22 ENCOUNTER — Encounter: Payer: Self-pay | Admitting: Neurology

## 2020-10-22 ENCOUNTER — Ambulatory Visit (INDEPENDENT_AMBULATORY_CARE_PROVIDER_SITE_OTHER): Payer: Medicare Other | Admitting: Neurology

## 2020-10-22 ENCOUNTER — Other Ambulatory Visit: Payer: Self-pay

## 2020-10-22 VITALS — BP 135/80 | HR 81 | Ht 66.0 in | Wt 134.0 lb

## 2020-10-22 DIAGNOSIS — G4485 Primary stabbing headache: Secondary | ICD-10-CM | POA: Diagnosis not present

## 2020-10-22 DIAGNOSIS — R531 Weakness: Secondary | ICD-10-CM | POA: Diagnosis not present

## 2020-10-22 DIAGNOSIS — I6310 Cerebral infarction due to embolism of unspecified precerebral artery: Secondary | ICD-10-CM | POA: Diagnosis not present

## 2020-10-22 MED ORDER — GABAPENTIN 300 MG PO CAPS
ORAL_CAPSULE | ORAL | 1 refills | Status: DC
Start: 2020-10-22 — End: 2021-10-09

## 2020-10-22 NOTE — Patient Instructions (Signed)
1. Continue gabapentin 300mg  twice daily 2.  Keep headache diary 3.  Limit use of pain relievers to no more than 2 days out of week to prevent risk of rebound or medication-overuse headache. 4.  Follow up 6 months

## 2020-11-04 NOTE — Progress Notes (Signed)
Carelink Summary Report / Loop Recorder 

## 2020-11-10 ENCOUNTER — Telehealth: Payer: Self-pay | Admitting: Emergency Medicine

## 2020-11-10 NOTE — Telephone Encounter (Signed)
Patient notified of ILR at [facility]. Will call if wants to schedule extraction in office. Will unplug monitor and return kit will be sent home to address on file.

## 2021-01-06 ENCOUNTER — Encounter: Payer: Self-pay | Admitting: Family Medicine

## 2021-01-06 ENCOUNTER — Other Ambulatory Visit: Payer: Self-pay | Admitting: Family Medicine

## 2021-01-06 ENCOUNTER — Ambulatory Visit (INDEPENDENT_AMBULATORY_CARE_PROVIDER_SITE_OTHER): Payer: Medicare Other | Admitting: Family Medicine

## 2021-01-06 ENCOUNTER — Other Ambulatory Visit: Payer: Self-pay

## 2021-01-06 VITALS — BP 119/78 | HR 93 | Ht 66.0 in | Wt 130.0 lb

## 2021-01-06 DIAGNOSIS — Z8673 Personal history of transient ischemic attack (TIA), and cerebral infarction without residual deficits: Secondary | ICD-10-CM

## 2021-01-06 DIAGNOSIS — R296 Repeated falls: Secondary | ICD-10-CM

## 2021-01-06 DIAGNOSIS — Z9181 History of falling: Secondary | ICD-10-CM

## 2021-01-06 DIAGNOSIS — F419 Anxiety disorder, unspecified: Secondary | ICD-10-CM

## 2021-01-06 DIAGNOSIS — R531 Weakness: Secondary | ICD-10-CM | POA: Diagnosis not present

## 2021-01-06 DIAGNOSIS — Z09 Encounter for follow-up examination after completed treatment for conditions other than malignant neoplasm: Secondary | ICD-10-CM

## 2021-01-06 MED ORDER — BUSPIRONE HCL 5 MG PO TABS: 5.0000 mg | ORAL_TABLET | Freq: Two times a day (BID) | ORAL | 6 refills | Status: DC

## 2021-01-06 MED FILL — busPIRone HCL 5 MG TABS: 5 | 30 days supply | Qty: 60 | Fill #0

## 2021-01-06 NOTE — Progress Notes (Signed)
[facility]   Established Patient Office Visit  Subjective:  Patient ID: Daniel Stuart, male    DOB: 1966-04-09  Age: 55 y.o. MRN: [id number]  CC:  Chief Complaint  Patient presents with  . Follow-up    HPI Daniel Stuart is a 55 year old male who presents for Follow Up.    Patient Active Problem List   Diagnosis Date Noted  . Frequent falls 03/11/2020  . Arrhythmia as indication for cardiac pacemaker replacement 03/11/2020  . Urinary frequency 03/11/2020  . Vitamin D deficiency 09/13/2019  . S/P stroke due to cerebrovascular disease 03/12/2019  . Right sided weakness 03/12/2019  . Anxiety 03/12/2019  . Hyperlipidemia   . Tobacco abuse 04/12/2017  . Dyslipidemia 04/12/2017  . PVD (peripheral vascular disease) (HCC) 04/12/2017  . Carotid arterial disease (HCC) 04/12/2017  . CVA (cerebral vascular accident) (HCC) 04/11/2017   Current Status: Since his last office visit, he is doing well with no complaints. He has recently obtained a new apartment that has access for the disabled, as he continues to have significant right extremity weakness. He uses cane and Rollator for ambulation. He is accompanied by his niece today. He has not had any headaches, visual changes, dizziness, and falls. No chest pain, heart palpitations, cough and shortness of breath reported. Denies GI problems such as nausea, vomiting, diarrhea, and constipation. He has no reports of blood in stools, dysuria and hematuria. No depression or anxiety, and denies suicidal ideations, homicidal ideations, or auditory hallucinations. He is takig all medications as prescribed. He denies pain today.   Past Medical History:  Diagnosis Date  . Anxiety   . Arrhythmia as indication for cardiac pacemaker replacement 03/2020  . Frequent falls 03/2020  . History of loop recorder 04/2017  . Stroke (cerebrum) (HCC)   . Tobacco use   . Urinary frequency 09/2019  .  Vitamin D deficiency     Past Surgical History:  Procedure Laterality Date  . FOOT SURGERY    . LOOP RECORDER INSERTION N/A 04/13/2017   Procedure: Loop Recorder Insertion;  Surgeon: Marinus Maw, MD;  [facility];  Service: Cardiovascular;  Laterality: N/A;  . TEE WITHOUT CARDIOVERSION N/A 04/13/2017   Procedure: TRANSESOPHAGEAL ECHOCARDIOGRAM (TEE);  Surgeon: Jake Bathe, MD;  Location: [facility] ENDOSCOPY;  Service: Cardiovascular;  Laterality: N/A;    Family History  Problem Relation Age of Onset  . Diabetes Mother   . Diabetes Father   . Heart failure Father   . Cancer Sister     Social History   Socioeconomic History  . Marital status: Legally Separated    Spouse name: Not on file  . Number of children: 1  . Years of education: Not on file  . Highest education level: Some college, no degree  Occupational History  . Occupation: disabled  Tobacco Use  . Smoking status: Current Every Day Smoker    Packs/day: 1.00    Types: Cigarettes    Start date: 11/01/1977  . Smokeless tobacco: Never Used  Vaping Use  . Vaping Use: Never used  Substance and Sexual Activity  . Alcohol use: No  . Drug use: No  . Sexual activity: Not Currently    Partners: Female  Other Topics Concern  . Not on file  Social History Narrative   Patient is right-handed. He lives in a one level home with his mother since CVA. He drinks one cup of coffee  and 2-3 sodas a day. He walks when weather permitting.   Social Determinants of Health   Financial Resource Strain: Not on file  Food Insecurity: Not on file  Transportation Needs: Not on file  Physical Activity: Not on file  Stress: Not on file  Social Connections: Not on file  Intimate Partner Violence: Not on file    Outpatient Medications Prior to Visit  Medication Sig Dispense Refill  . aspirin 81 MG EC tablet Take 1 tablet (81 mg total) by mouth daily. 30 tablet 3  . atorvastatin (LIPITOR) 80 MG tablet TAKE 1 TABLET BY  MOUTH EVERY EVENING. 30 tablet 6  . gabapentin (NEURONTIN) 300 MG capsule TAKE 1 CAPSULE BY MOUTH BY MOUTH TWO TIMES DAILY 180 capsule 1  . oxybutynin (DITROPAN XL) 10 MG 24 hr tablet Take 1 tablet (10 mg total) by mouth at bedtime. 90 tablet 3  . Vitamin D, Ergocalciferol, (DRISDOL) 1.25 MG (50000 UNIT) CAPS capsule Take 1 capsule (50,000 Units total) by mouth every 7 (seven) days. 5 capsule 6  . sulfamethoxazole-trimethoprim (BACTRIM DS) 800-160 MG tablet Take 1 tablet by mouth 2 (two) times daily. (Patient not taking: Reported on 10/07/2020) 14 tablet 0   No facility-administered medications prior to visit.    No Known Allergies  ROS Review of Systems    Objective:    Physical Exam  BP 119/78   Pulse 93   Ht 5\' 6"  (1.676 m)   Wt 130 lb (59 kg)   SpO2 100%   BMI 20.98 kg/m  Wt Readings from Last 3 Encounters:  01/06/21 130 lb (59 kg)  10/22/20 134 lb (60.8 kg)  10/07/20 130 lb 3.2 oz (59.1 kg)     Health Maintenance Due  Topic Date Due  . Hepatitis C Screening  Never done  . COLONOSCOPY (Pts 45-76yrs Insurance coverage will need to be confirmed)  Never done  . COVID-19 Vaccine (3 - Booster for Pfizer series) 08/26/2020    There are no preventive care reminders to display for this patient.  Lab Results  Component Value Date   TSH 1.520 06/11/2020   Lab Results  Component Value Date   WBC 11.0 (H) 06/11/2020   HGB 15.3 06/11/2020   HCT 44.9 06/11/2020   MCV 93 06/11/2020   PLT 230 06/11/2020   Lab Results  Component Value Date   NA 140 06/11/2020   K 3.7 06/11/2020   CO2 22 06/11/2020   GLUCOSE 95 06/11/2020   BUN 4 (L) 06/11/2020   CREATININE 1.17 06/11/2020   BILITOT 0.5 06/11/2020   ALKPHOS 133 (H) 06/11/2020   AST 15 06/11/2020   ALT 15 06/11/2020   PROT 7.5 06/11/2020   ALBUMIN 4.2 06/11/2020   CALCIUM 9.1 06/11/2020   ANIONGAP 10 04/13/2017   Lab Results  Component Value Date   CHOL 102 06/11/2020   Lab Results  Component Value Date    HDL 39 (L) 06/11/2020   Lab Results  Component Value Date   LDLCALC 49 06/11/2020   Lab Results  Component Value Date   TRIG 61 06/11/2020   Lab Results  Component Value Date   CHOLHDL 2.6 06/11/2020   Lab Results  Component Value Date   HGBA1C 5.3 03/11/2020      Assessment & Plan:   Problem List Items Addressed This Visit   None     No orders of the defined types were placed in this encounter.   Follow-up: No follow-ups on file.  Azzie Glatter, [name]

## 2021-01-13 ENCOUNTER — Telehealth: Payer: Self-pay

## 2021-01-13 ENCOUNTER — Other Ambulatory Visit: Payer: Self-pay | Admitting: Family Medicine

## 2021-01-13 DIAGNOSIS — R531 Weakness: Secondary | ICD-10-CM

## 2021-01-13 DIAGNOSIS — Z8673 Personal history of transient ischemic attack (TIA), and cerebral infarction without residual deficits: Secondary | ICD-10-CM

## 2021-01-13 DIAGNOSIS — R350 Frequency of micturition: Secondary | ICD-10-CM

## 2021-01-13 NOTE — Telephone Encounter (Signed)
Hello, this Mr. Shenk family member, Jennings Books, called me today requesting assistance in getting patient's meds refilled and would like his provider to order a shower bench/chair for him if possible. I tried calling her back just now, but got voicemail. Would y'all be able to reach out to them about this? She tried calling front desk but I think she must have called at lunch time. Jasmine's number is [phone] and the patient's number is in his chart. Thank you

## 2021-01-13 NOTE — Telephone Encounter (Signed)
Left message for patient to call back to assist with medication refills.

## 2021-01-13 NOTE — Telephone Encounter (Signed)
This encounter was created in error - please disregard.

## 2021-01-13 NOTE — Telephone Encounter (Signed)
Spoke with [facility] and advised to contact pharmacy regarding patients medication needs.  Order for shower chair sent to [facility].

## 2021-01-13 NOTE — Addendum Note (Signed)
Addended by: Judd Gaudier on: 01/13/2021 03:59 PM   Modules accepted: Orders

## 2021-01-13 NOTE — Telephone Encounter (Signed)
Pt's niece Leavy Cella called for a refill on her uncle's Oxybutynin 10mg   [facility]  CB# [phone]

## 2021-01-14 ENCOUNTER — Other Ambulatory Visit: Payer: Self-pay | Admitting: Family Medicine

## 2021-01-14 MED ORDER — OXYBUTYNIN CHLORIDE ER 10 MG PO TB24: 10.0000 mg | ORAL_TABLET | Freq: Every day | ORAL | 3 refills | Status: DC

## 2021-01-16 ENCOUNTER — Other Ambulatory Visit: Payer: Self-pay | Admitting: Family Medicine

## 2021-01-16 ENCOUNTER — Telehealth: Payer: Self-pay

## 2021-01-16 DIAGNOSIS — E785 Hyperlipidemia, unspecified: Secondary | ICD-10-CM

## 2021-01-16 MED ORDER — ATORVASTATIN CALCIUM 80 MG PO TABS: 80.0000 mg | ORAL_TABLET | Freq: Every evening | ORAL | 3 refills | Status: DC

## 2021-01-16 NOTE — Telephone Encounter (Signed)
Medication refill Atorvastatin and gabapentin

## 2021-01-16 NOTE — Telephone Encounter (Signed)
Atorvastatin refilled.  Advised patient to contact Dr Everlena Cooper for gabapentin refill.

## 2021-01-19 MED FILL — GABAPENTIN 300 MG CAPSULE: 300 | 90 days supply | Qty: 180 | Fill #1

## 2021-01-20 ENCOUNTER — Encounter: Payer: Self-pay | Admitting: Family Medicine

## 2021-01-31 ENCOUNTER — Other Ambulatory Visit: Payer: Self-pay

## 2021-03-03 ENCOUNTER — Other Ambulatory Visit: Payer: Self-pay

## 2021-04-20 ENCOUNTER — Other Ambulatory Visit: Payer: Self-pay

## 2021-04-20 MED FILL — Atorvastatin Calcium Tab 80 MG (Base Equivalent): ORAL | 90 days supply | Qty: 90 | Fill #0 | Status: AC

## 2021-04-20 MED FILL — Gabapentin Cap 300 MG: ORAL | 90 days supply | Qty: 180 | Fill #0 | Status: AC
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
## 2021-04-22 ENCOUNTER — Telehealth (INDEPENDENT_AMBULATORY_CARE_PROVIDER_SITE_OTHER): Payer: Medicare Other | Admitting: Neurology

## 2021-04-22 ENCOUNTER — Other Ambulatory Visit: Payer: Self-pay

## 2021-04-22 ENCOUNTER — Encounter: Payer: Self-pay | Admitting: Neurology

## 2021-04-22 VITALS — Ht 66.0 in | Wt 130.0 lb

## 2021-04-22 DIAGNOSIS — G4485 Primary stabbing headache: Secondary | ICD-10-CM

## 2021-07-10 ENCOUNTER — Ambulatory Visit (INDEPENDENT_AMBULATORY_CARE_PROVIDER_SITE_OTHER): Payer: Medicare Other | Admitting: Nurse Practitioner

## 2021-07-10 ENCOUNTER — Ambulatory Visit: Payer: Medicare Other | Admitting: Family Medicine

## 2021-07-10 ENCOUNTER — Other Ambulatory Visit: Payer: Self-pay

## 2021-07-10 ENCOUNTER — Encounter: Payer: Self-pay | Admitting: Nurse Practitioner

## 2021-07-10 VITALS — BP 115/85 | HR 74 | Temp 97.2°F | Ht 66.0 in | Wt 131.0 lb

## 2021-07-10 DIAGNOSIS — R3914 Feeling of incomplete bladder emptying: Secondary | ICD-10-CM

## 2021-07-10 DIAGNOSIS — R531 Weakness: Secondary | ICD-10-CM

## 2021-07-10 DIAGNOSIS — I69320 Aphasia following cerebral infarction: Secondary | ICD-10-CM

## 2021-07-10 DIAGNOSIS — Z23 Encounter for immunization: Secondary | ICD-10-CM

## 2021-07-10 DIAGNOSIS — E785 Hyperlipidemia, unspecified: Secondary | ICD-10-CM

## 2021-07-10 DIAGNOSIS — F32A Depression, unspecified: Secondary | ICD-10-CM

## 2021-07-10 DIAGNOSIS — Z8673 Personal history of transient ischemic attack (TIA), and cerebral infarction without residual deficits: Secondary | ICD-10-CM | POA: Diagnosis not present

## 2021-07-10 DIAGNOSIS — Z1159 Encounter for screening for other viral diseases: Secondary | ICD-10-CM

## 2021-07-10 DIAGNOSIS — Z1211 Encounter for screening for malignant neoplasm of colon: Secondary | ICD-10-CM

## 2021-07-10 DIAGNOSIS — Z125 Encounter for screening for malignant neoplasm of prostate: Secondary | ICD-10-CM

## 2021-07-10 LAB — POCT URINALYSIS DIP (CLINITEK)
Bilirubin, UA: NEGATIVE
Blood, UA: NEGATIVE
Glucose, UA: NEGATIVE mg/dL
Ketones, POC UA: NEGATIVE mg/dL
Nitrite, UA: NEGATIVE
POC PROTEIN,UA: NEGATIVE
Spec Grav, UA: 1.015 (ref 1.010–1.025)
Urobilinogen, UA: 0.2 E.U./dL
pH, UA: 6 (ref 5.0–8.0)

## 2021-07-10 NOTE — Progress Notes (Signed)
[facility] [address] [place], [place]  [address] Phone:  [phone]   Fax:  [phone]   Established Patient Office Visit  Subjective:  Patient ID: Daniel Stuart, male    DOB: May 26, 1966  Age: 55 y.o. MRN: [id number]  CC:  Chief Complaint  Patient presents with   Follow-up    Pt is here today for his follow up visit. Pt states that he is having concerns about his mobility to not be able to walk very well for about 2 months now. Pt states that he stumbles a lot when he is walking. Pt is now walking with a walker.    HPI Daniel Stuart presents for follow up. He  has a past medical history of Anxiety, Arrhythmia as indication for cardiac pacemaker replacement (03/2020), Frequent falls (03/2020), History of loop recorder (04/2017), Stroke (cerebrum) (HCC), Tobacco use, Urinary frequency (09/2019), and Vitamin D deficiency.   He suffered a fall last week. He was trying to get on his walker and it fell over and he fell. He denies any injury. He has some dragging in his right foot. He has a rolllatar He seems to have more problems with the walker. He lives a senior living center. This is a new move within the in the last month. He reports that the move is due to his mother having a stroke and in now is residing the nursing home. He does have the support of his sister.   Past Medical History:  Diagnosis Date   Anxiety    Arrhythmia as indication for cardiac pacemaker replacement 03/2020   Frequent falls 03/2020   History of loop recorder 04/2017   Stroke (cerebrum) (HCC)    Tobacco use    Urinary frequency 09/2019   Vitamin D deficiency     Past Surgical History:  Procedure Laterality Date   FOOT SURGERY     LOOP RECORDER INSERTION N/A 04/13/2017   Procedure: Loop Recorder Insertion;  Surgeon: Marinus Maw, MD;  [facility]: [facility];  Service: Cardiovascular;  Laterality: N/A;   TEE WITHOUT CARDIOVERSION N/A 04/13/2017   Procedure:  TRANSESOPHAGEAL ECHOCARDIOGRAM (TEE);  Surgeon: Jake Bathe, MD;  Location: [facility] ENDOSCOPY;  Service: Cardiovascular;  Laterality: N/A;    Family History  Problem Relation Age of Onset   Diabetes Mother    Diabetes Father    Heart failure Father    Cancer Sister     Social History   Socioeconomic History   Marital status: Legally Separated    Spouse name: Not on file   Number of children: 1   Years of education: Not on file   Highest education level: Some college, no degree  Occupational History   Occupation: disabled  Tobacco Use   Smoking status: Every Day    Packs/day: 1.00    Types: Cigarettes    Start date: 11/01/1977   Smokeless tobacco: Never  Vaping Use   Vaping Use: Never used  Substance and Sexual Activity   Alcohol use: No   Drug use: No   Sexual activity: Not Currently    Partners: Female  Other Topics Concern   Not on file  Social History Narrative   Patient is right-handed. He lives in a one level home with his mother since CVA. He drinks one cup of coffee and 2-3 sodas a day. He walks when weather permitting.   Social Determinants of Health   Financial Resource Strain: Not on file  Food Insecurity: Not on file  Transportation Needs: Not on file  Physical Activity: Not on file  Stress: Not on file  Social Connections: Not on file  Intimate Partner Violence: Not on file    Outpatient Medications Prior to Visit  Medication Sig Dispense Refill   aspirin 81 MG EC tablet Take 1 tablet (81 mg total) by mouth daily. 30 tablet 3   atorvastatin (LIPITOR) 80 MG tablet TAKE 1 TABLET (80 MG TOTAL) BY MOUTH EVERY EVENING. 90 tablet 3   gabapentin (NEURONTIN) 300 MG capsule TAKE 1 CAPSULE BY MOUTH BY MOUTH TWO TIMES DAILY 180 capsule 1   gabapentin (NEURONTIN) 300 MG capsule TAKE 1 CAPSULE BY MOUTH BY MOUTH TWO TIMES DAILY 180 capsule 3   busPIRone (BUSPAR) 5 MG tablet TAKE 1 TABLET (5 MG TOTAL) BY MOUTH 2 (TWO) TIMES DAILY. (Patient not taking: Reported on  07/10/2021) 60 tablet 6   oxybutynin (DITROPAN-XL) 10 MG 24 hr tablet TAKE 1 TABLET (10 MG TOTAL) BY MOUTH AT BEDTIME. (Patient not taking: No sig reported) 90 tablet 3   No facility-administered medications prior to visit.    No Known Allergies  ROS Review of Systems  Respiratory:  Negative for shortness of breath.   Cardiovascular:        Discomfort in chest with taking the Liptor 4/10 pain that comes and goes without treatment.  Neurological:  Negative for dizziness and headaches.     Objective:    Physical Exam HENT:     Head: Normocephalic and atraumatic.     Nose: Nose normal.     Mouth/Throat:     Mouth: Mucous membranes are moist.  Cardiovascular:     Rate and Rhythm: Normal rate and regular rhythm.     Pulses: Normal pulses.     Heart sounds: Normal heart sounds.  Pulmonary:     Effort: Pulmonary effort is normal.     Comments: diminshed Abdominal:     Palpations: Abdomen is soft.     Comments: hypoactive  Musculoskeletal:     Cervical back: Normal range of motion.     Right lower leg: No edema.     Left lower leg: No edema.     Comments: Rollator walker Right leg weakness with adduction   Skin:    General: Skin is warm and dry.     Capillary Refill: Capillary refill takes less than 2 seconds.  Neurological:     General: No focal deficit present.     Mental Status: He is alert and oriented to person, place, and time.     Comments: aphasia  Psychiatric:        Mood and Affect: Mood normal.        Behavior: Behavior normal.   BP 115/85   Pulse 74   Temp (!) 97.2 F (36.2 C)   Ht 5\' 6"  (1.676 m)   Wt 131 lb (59.4 kg)   SpO2 100%   BMI 21.14 kg/m  Wt Readings from Last 3 Encounters:  07/10/21 131 lb (59.4 kg)  04/22/21 130 lb (59 kg)  01/06/21 130 lb (59 kg)     Health Maintenance Due  Topic Date Due   Hepatitis C Screening  Never done    There are no preventive care reminders to display for this patient.  Lab Results  Component Value Date    TSH 1.520 06/11/2020   Lab Results  Component Value Date   WBC 11.0 (H) 06/11/2020   HGB 15.3 06/11/2020  HCT 44.9 06/11/2020   MCV 93 06/11/2020   PLT 230 06/11/2020   Lab Results  Component Value Date   NA 140 06/11/2020   K 3.7 06/11/2020   CO2 22 06/11/2020   GLUCOSE 95 06/11/2020   BUN 4 (L) 06/11/2020   CREATININE 1.17 06/11/2020   BILITOT 0.5 06/11/2020   ALKPHOS 133 (H) 06/11/2020   AST 15 06/11/2020   ALT 15 06/11/2020   PROT 7.5 06/11/2020   ALBUMIN 4.2 06/11/2020   CALCIUM 9.1 06/11/2020   ANIONGAP 10 04/13/2017   Lab Results  Component Value Date   CHOL 102 06/11/2020   Lab Results  Component Value Date   HDL 39 (L) 06/11/2020   Lab Results  Component Value Date   LDLCALC 49 06/11/2020   Lab Results  Component Value Date   TRIG 61 06/11/2020   Lab Results  Component Value Date   CHOLHDL 2.6 06/11/2020   Lab Results  Component Value Date   HGBA1C 5.3 03/11/2020      Assessment & Plan:   Problem List Items Addressed This Visit       Nervous and Auditory   Right sided weakness   Relevant Orders   Ambulatory referral to Home Health     Other   Hyperlipidemia - Primary Stable Encouraged heart healthy diet Continue with current regimen.  No changes warranted. Good patient compliance.    Relevant Orders   POCT URINALYSIS DIP (CLINITEK)   Comp. Metabolic Panel (12)   Lipid panel   S/P stroke due to cerebrovascular disease   Relevant Orders   Ambulatory referral to Home Health   Other Visit Diagnoses     Flu vaccine need       Colon cancer screening       Relevant Orders   Cologuard   Encounter for hepatitis C screening test for low risk patient       Relevant Orders   Hepatitis C antibody   Depression, unspecified depression type     Persistent Currently related to new move and changes in health   Relevant Orders   Ambulatory referral to Psychiatry   Screening for prostate cancer       Relevant Orders   PSA    Feeling of incomplete bladder emptying        Relevant Orders   PSA   Aphasia as late effect of stroke           No orders of the defined types were placed in this encounter.   Follow-up: Return in about 3 months (around 10/09/2021) for HLD 99213.    Barbette Merino, NP

## 2021-07-10 NOTE — Patient Instructions (Signed)

## 2021-07-10 NOTE — Progress Notes (Signed)
[facility] General Follow Up Note  07/10/2021 Name: YIANNI SKILLING MRN: [id number] DOB: 01-16-1966 GENEVA PALLAS is a 55 y.o. year old male who sees No primary care provider on file. for primary care. LCSW was consulted to assist the patient with community resources.  Interpreter: No.   Interpreter Name & Language: none  Assessment: Patient experiencing food insecurity. He also has challenges preparing his food, due to history of stroke.  Ongoing Intervention: CSW met with patient during PCP visit. Referred patient to [employer] [facility] [employer] and [facility] Program for free groceries. Patient does not meet the age requirement for Meals on Wheels, but CSW to look into options given patient's medical conditions.  Review of patient status, including review of consultants reports, relevant laboratory and other test results, and collaboration with appropriate care team members and the patient's provider was performed as part of comprehensive patient evaluation and provision of services.    Estanislado Emms, [facility] [phone]

## 2021-07-11 LAB — COMP. METABOLIC PANEL (12)
AST: 19 IU/L (ref 0–40)
Albumin/Globulin Ratio: 1.3 (ref 1.2–2.2)
Albumin: 4.5 g/dL (ref 3.8–4.9)
Alkaline Phosphatase: 112 IU/L (ref 44–121)
BUN/Creatinine Ratio: 4 — ABNORMAL LOW (ref 9–20)
BUN: 5 mg/dL — ABNORMAL LOW (ref 6–24)
Bilirubin Total: 0.7 mg/dL (ref 0.0–1.2)
Calcium: 9.5 mg/dL (ref 8.7–10.2)
Chloride: 98 mmol/L (ref 96–106)
Creatinine, Ser: 1.22 mg/dL (ref 0.76–1.27)
Globulin, Total: 3.4 g/dL (ref 1.5–4.5)
Glucose: 91 mg/dL (ref 65–99)
Potassium: 4.2 mmol/L (ref 3.5–5.2)
Sodium: 139 mmol/L (ref 134–144)
Total Protein: 7.9 g/dL (ref 6.0–8.5)
eGFR: 69 mL/min/{1.73_m2} (ref >59)

## 2021-07-11 LAB — LIPID PANEL
Chol/HDL Ratio: 2.4 ratio (ref 0.0–5.0)
Cholesterol, Total: 106 mg/dL (ref 100–199)
HDL: 45 mg/dL (ref >39)
LDL Chol Calc (NIH): 47 mg/dL (ref 0–99)
Triglycerides: 67 mg/dL (ref 0–149)
VLDL Cholesterol Cal: 14 mg/dL (ref 5–40)

## 2021-07-11 LAB — HEPATITIS C ANTIBODY: Hep C Virus Ab: <0.1 s/co ratio (ref 0.0–0.9)

## 2021-07-11 LAB — PSA: Prostate Specific Ag, Serum: 0.9 ng/mL (ref 0.0–4.0)

## 2021-07-15 ENCOUNTER — Other Ambulatory Visit: Payer: Self-pay | Admitting: Nurse Practitioner

## 2021-07-17 ENCOUNTER — Telehealth: Payer: Self-pay | Admitting: Clinical

## 2021-07-17 NOTE — Telephone Encounter (Signed)
[facility] General Follow Up Note  07/17/2021 Name: Daniel Stuart MRN: [id number] DOB: 14-Oct-1966 Daniel Stuart is a 55 y.o. year old male who sees No primary care provider on file. for primary care. LCSW was consulted to assist the patient with community resources.  Interpreter: No.   Interpreter Name & Language: none  Assessment: Patient experiencing food insecurity. He also has challenges preparing his food, due to history of stroke.  Ongoing Intervention: CSW called patient today to review referral to [facility] for Mom's Meals. Patient consented to referral and CSW submitted. CSW to follow, as [facility] indicated patient will need to submit payment for the meals online.  Review of patient status, including review of consultants reports, relevant laboratory and other test results, and collaboration with appropriate care team members and the patient's provider was performed as part of comprehensive patient evaluation and provision of services.    Abigail Butts, LCSW [facility] [phone]

## 2021-07-20 ENCOUNTER — Other Ambulatory Visit: Payer: Self-pay

## 2021-07-20 MED FILL — Gabapentin Cap 300 MG: ORAL | 90 days supply | Qty: 180 | Fill #1 | Status: AC

## 2021-07-20 MED FILL — Atorvastatin Calcium Tab 80 MG (Base Equivalent): ORAL | 90 days supply | Qty: 90 | Fill #1 | Status: AC

## 2021-07-21 ENCOUNTER — Telehealth: Payer: Self-pay | Admitting: Clinical

## 2021-07-21 NOTE — Telephone Encounter (Signed)
Integrated Behavioral Health General Follow Up Note  07/21/2021 Name: Daniel Stuart MRN: [id number] DOB: 1966/04/18 Daniel Stuart is a 55 y.o. year old male who sees No primary care provider on file. for primary care. LCSW was consulted to assist the patient with community resources.  Interpreter: No.   Interpreter Name & Language: none  Assessment: Patient experiencing food insecurity. He also has challenges preparing his food, due to history of stroke.  Ongoing Intervention: CSW called patient today to review referral to [facility]) for [employer]. Advised patient of the payment procedure and the amount. Patient indicated he won't be able to afford it at this time. CSW had also referred patient to [employer], retail and Nutrition Program for grocery delivery at his last visit in the office.   Review of patient status, including review of consultants reports, relevant laboratory and other test results, and collaboration with appropriate care team members and the patient's provider was performed as part of comprehensive patient evaluation and provision of services.    Abigail Butts, LCSW [facility] [phone]

## 2021-07-31 ENCOUNTER — Ambulatory Visit (INDEPENDENT_AMBULATORY_CARE_PROVIDER_SITE_OTHER): Payer: Medicare Other

## 2021-07-31 DIAGNOSIS — Z0000 Encounter for general adult medical examination without abnormal findings: Secondary | ICD-10-CM | POA: Diagnosis not present

## 2021-07-31 NOTE — Progress Notes (Signed)
Subjective:   Daniel Stuart is a 55 y.o. male who presents for an Initial Medicare Annual Wellness Visit.I connected with  Larina Earthly on 07/31/21 by a audio enabled telemedicine application and verified that I am speaking with the correct person using two identifiers.   I discussed the limitations of evaluation and management by telemedicine. The patient expressed understanding and agreed to proceed.   Location of patient: Home Location of provider: Office  Persons participating in visit: Daniel Stuart (patient) and Donnetta Hail, CMA  Review of Systems    Defer to PCP       Objective:    There were no vitals filed for this visit. There is no height or weight on file to calculate BMI.  Advanced Directives 07/31/2021 10/07/2020 10/22/2019 04/23/2019 12/23/2017 06/06/2017 05/17/2017  Does Patient Have a Medical Advance Directive? Yes No No No No No No  Would patient like information on creating a medical advance directive? - - - - No - Patient declined No - Patient declined No - Patient declined    Current Medications (verified) Outpatient Encounter Medications as of 07/31/2021  Medication Sig   aspirin 81 MG EC tablet Take 1 tablet (81 mg total) by mouth daily.   atorvastatin (LIPITOR) 80 MG tablet TAKE 1 TABLET (80 MG TOTAL) BY MOUTH EVERY EVENING.   gabapentin (NEURONTIN) 300 MG capsule TAKE 1 CAPSULE BY MOUTH BY MOUTH TWO TIMES DAILY   gabapentin (NEURONTIN) 300 MG capsule TAKE 1 CAPSULE BY MOUTH BY MOUTH TWO TIMES DAILY   busPIRone (BUSPAR) 5 MG tablet TAKE 1 TABLET (5 MG TOTAL) BY MOUTH 2 (TWO) TIMES DAILY. (Patient not taking: No sig reported)   oxybutynin (DITROPAN-XL) 10 MG 24 hr tablet TAKE 1 TABLET (10 MG TOTAL) BY MOUTH AT BEDTIME. (Patient not taking: No sig reported)   No facility-administered encounter medications on file as of 07/31/2021.    Allergies (verified) Patient has no known allergies.   History: Past Medical History:  Diagnosis Date    Anxiety    Arrhythmia as indication for cardiac pacemaker replacement 03/2020   Frequent falls 03/2020   History of loop recorder 04/2017   Stroke (cerebrum) (HCC)    Tobacco use    Urinary frequency 09/2019   Vitamin D deficiency    Past Surgical History:  Procedure Laterality Date   FOOT SURGERY     LOOP RECORDER INSERTION N/A 04/13/2017   Procedure: Loop Recorder Insertion;  Surgeon: Marinus Maw, MD;  Location: [facility] INVASIVE CV LAB;  Service: Cardiovascular;  Laterality: N/A;   TEE WITHOUT CARDIOVERSION N/A 04/13/2017   Procedure: TRANSESOPHAGEAL ECHOCARDIOGRAM (TEE);  Surgeon: Jake Bathe, MD;  Location: [facility] ENDOSCOPY;  Service: Cardiovascular;  Laterality: N/A;   Family History  Problem Relation Age of Onset   Diabetes Mother    Diabetes Father    Heart failure Father    Cancer Sister    Social History   Socioeconomic History   Marital status: Legally Separated    Spouse name: Not on file   Number of children: 1   Years of education: Not on file   Highest education level: Some college, no degree  Occupational History   Occupation: disabled  Tobacco Use   Smoking status: Every Day    Packs/day: 1.00    Types: Cigarettes    Start date: 11/01/1977   Smokeless tobacco: Never  Vaping Use   Vaping Use: Never used  Substance and Sexual Activity   Alcohol use: No  Drug use: No   Sexual activity: Not Currently    Partners: Female  Other Topics Concern   Not on file  Social History Narrative   Patient is right-handed. He lives in a one level home with his mother since CVA. He drinks one cup of coffee and 2-3 sodas a day. He walks when weather permitting.   Social Determinants of Health   Financial Resource Strain: Medium Risk   Difficulty of Paying Living Expenses: Somewhat hard  Food Insecurity: No Food Insecurity   Worried About Programme researcher, broadcasting/film/video in the Last Year: Never true   Ran Out of Food in the Last Year: Never true  Transportation Needs: No  Transportation Needs   Lack of Transportation (Medical): No   Lack of Transportation (Non-Medical): No  Physical Activity: Inactive   Days of Exercise per Week: 0 days   Minutes of Exercise per Session: 0 min  Stress: No Stress Concern Present   Feeling of Stress : Not at all  Social Connections: Socially Isolated   Frequency of Communication with Friends and Family: Three times a week   Frequency of Social Gatherings with Friends and Family: Never   Attends Religious Services: Never   Database administrator or Organizations: No   Attends Engineer, structural: Never   Marital Status: Separated    Tobacco Counseling Ready to quit: Not Answered Counseling given: Not Answered   Clinical Intake:  Pre-visit preparation completed: Yes  Pain : No/denies pain     Diabetes: No  How often do you need to have someone help you when you read instructions, pamphlets, or other written materials from your doctor or pharmacy?: 1 - Never What is the last grade level you completed in school?: 12th grade  Diabetic?No  Interpreter Needed?: No  Information entered by :: Donnetta Hail, CMA   Activities of Daily Living In your present state of health, do you have any difficulty performing the following activities: 07/31/2021  Hearing? N  Vision? N  Difficulty concentrating or making decisions? Y  Walking or climbing stairs? Y  Dressing or bathing? Y  Doing errands, shopping? N  Preparing Food and eating ? Y  Using the Toilet? Y  In the past six months, have you accidently leaked urine? N  Do you have problems with loss of bowel control? N  Managing your Medications? N  Managing your Finances? N  Housekeeping or managing your Housekeeping? Y  Some recent data might be hidden    Patient Care Team: Jodelle Red, MD as PCP - Cardiology (Cardiology) Drema Dallas, DO as Consulting Physician (Neurology)  Indicate any recent Medical Services you may have received  from other than [facility] providers in the past year (date may be approximate).     Assessment:   This is a routine wellness examination for Daniel Stuart.  Hearing/Vision screen No results found.  Dietary issues and exercise activities discussed:     Goals Addressed   None    Depression Screen PHQ 2/9 Scores 07/31/2021 07/10/2021 10/07/2020 06/11/2020 09/12/2019 03/12/2019 10/10/2018  PHQ - 2 Score 4 4 6 1  0 1 2  PHQ- 9 Score 17 17 18  - - - 3    Fall Risk Fall Risk  07/31/2021 07/10/2021 04/22/2021 10/07/2020 06/11/2020  Falls in the past year? 1 - 1 1 1   Comment - - - - -  Number falls in past yr: 1 1 1 1 1   Injury with Fall? 0 0 0 0 -  Risk for fall due to : - - - - Impaired mobility;Impaired balance/gait  Follow up Falls evaluation completed - - - -    FALL RISK PREVENTION PERTAINING TO THE HOME:  Any stairs in or around the home? No  If so, are there any without handrails? No  Home free of loose throw rugs in walkways, pet beds, electrical cords, etc? Yes  Adequate lighting in your home to reduce risk of falls? Yes   ASSISTIVE DEVICES UTILIZED TO PREVENT FALLS:  Life alert? No  Use of a cane, walker or w/c? Yes  Grab bars in the bathroom? No  Shower chair or bench in shower? Yes  Elevated toilet seat or a handicapped toilet? No   TIMED UP AND GO:  Was the test performed?  N/A .  Length of time to ambulate 10 feet: N/A sec.     Cognitive Function:     6CIT Screen 07/31/2021  What Year? 0 points  What month? 0 points  What time? 0 points  Count back from 20 4 points  Months in reverse 4 points  Repeat phrase 10 points  Total Score 18    Immunizations Immunization History  Administered Date(s) Administered   Influenza,inj,Quad PF,6+ Mos 09/12/2019, 10/07/2020, 07/10/2021   PFIZER(Purple Top)SARS-COV-2 Vaccination 01/31/2020, 02/25/2020    TDAP status: Due, Education has been provided regarding the importance of this vaccine. Advised may receive this vaccine at local  pharmacy or Health Dept. Aware to provide a copy of the vaccination record if obtained from local pharmacy or Health Dept. Verbalized acceptance and understanding.  Flu Vaccine status: Up to date   Covid-19 vaccine status: Completed vaccines  Qualifies for Shingles Vaccine? Yes   Zostavax completed No   Shingrix Completed?: No.    Education has been provided regarding the importance of this vaccine. Patient has been advised to call insurance company to determine out of pocket expense if they have not yet received this vaccine. Advised may also receive vaccine at local pharmacy or Health Dept. Verbalized acceptance and understanding.  Screening Tests Health Maintenance  Topic Date Due   COVID-19 Vaccine (3 - Booster for Pfizer series) 07/27/2020   Zoster Vaccines- Shingrix (1 of 2) 10/09/2021 (Originally 04/16/2014)   COLONOSCOPY (Pts 45-33yrs Insurance coverage will need to be confirmed)  07/10/2022 (Originally 04/16/2009)   TETANUS/TDAP  04/19/2027 (Originally 04/17/1983)   INFLUENZA VACCINE  Completed   Hepatitis C Screening  Completed   HIV Screening  Completed   HPV VACCINES  Aged Out    Health Maintenance  Health Maintenance Due  Topic Date Due   COVID-19 Vaccine (3 - Booster for Pfizer series) 07/27/2020    Colorectal cancer screening: Type of screening: Colonoscopy. Completed Postponed until 07/10/22. Repeat every 10 years  Lung Cancer Screening: (Low Dose CT Chest recommended if Age 79-80 years, 30 pack-year currently smoking OR have quit w/in 15years.) does qualify.   Lung Cancer Screening Referral: No  Additional Screening:  Hepatitis C Screening: does qualify; Completed 07/10/21  Vision Screening: Recommended annual ophthalmology exams for early detection of glaucoma and other disorders of the eye. Is the patient up to date with their annual eye exam?  No  Who is the provider or what is the name of the office in which the patient attends annual eye exams? N/A If pt is  not established with a provider, would they like to be referred to a provider to establish care? No .   Dental Screening: Recommended annual dental exams for  proper oral hygiene  Community Resource Referral / Chronic Care Management: CRR required this visit?  No   CCM required this visit?  No      Plan:     I have personally reviewed and noted the following in the patient's chart:   Medical and social history Use of alcohol, tobacco or illicit drugs  Current medications and supplements including opioid prescriptions. Patient is not currently taking opioid prescriptions. Functional ability and status Nutritional status Physical activity Advanced directives List of other physicians Hospitalizations, surgeries, and ER visits in previous 12 months Vitals Screenings to include cognitive, depression, and falls Referrals and appointments  In addition, I have reviewed and discussed with patient certain preventive protocols, quality metrics, and best practice recommendations. A written personalized care plan for preventive services as well as general preventive health recommendations were provided to patient.     Donnetta Hail, [facility]   07/31/2021   Nurse Notes: Non Face to Face 60 minute visit Encounter.  Daniel Stuart , Thank you for taking time to come for your Medicare Wellness Visit. I appreciate your ongoing commitment to your health goals. Please review the following plan we discussed and let me know if I can assist you in the future.   These are the goals we discussed:  Goals   None     This is a list of the screening recommended for you and due dates:  Health Maintenance  Topic Date Due   COVID-19 Vaccine (3 - Booster for Pfizer series) 07/27/2020   Zoster (Shingles) Vaccine (1 of 2) 10/09/2021*   Colon Cancer Screening  07/10/2022*   Tetanus Vaccine  04/19/2027*   Flu Shot  Completed   Hepatitis C Screening: USPSTF Recommendation to screen - Ages 18-79 yo.  Completed    HIV Screening  Completed   HPV Vaccine  Aged Out  *Topic was postponed. The date shown is not the original due date.

## 2021-07-31 NOTE — Patient Instructions (Signed)
Health Maintenance, Male Adopting a healthy lifestyle and getting preventive care are important in promoting health and wellness. Ask your health care provider about: The right schedule for you to have regular tests and exams. Things you can do on your own to prevent diseases and keep yourself healthy. What should I know about diet, weight, and exercise? Eat a healthy diet  Eat a diet that includes plenty of vegetables, fruits, low-fat dairy products, and lean protein. Do not eat a lot of foods that are high in solid fats, added sugars, or sodium. Maintain a healthy weight Body mass index (BMI) is a measurement that can be used to identify possible weight problems. It estimates body fat based on height and weight. Your health care provider can help determine your BMI and help you achieve or maintain a healthy weight. Get regular exercise Get regular exercise. This is one of the most important things you can do for your health. Most adults should: Exercise for at least 150 minutes each week. The exercise should increase your heart rate and make you sweat (moderate-intensity exercise). Do strengthening exercises at least twice a week. This is in addition to the moderate-intensity exercise. Spend less time sitting. Even light physical activity can be beneficial. Watch cholesterol and blood lipids Have your blood tested for lipids and cholesterol at 57 years of age, then have this test every 5 years. You may need to have your cholesterol levels checked more often if: Your lipid or cholesterol levels are high. You are older than 57 years of age. You are at high risk for heart disease. What should I know about cancer screening? Many types of cancers can be detected early and may often be prevented. Depending on your health history and family history, you may need to have cancer screening at various ages. This may include screening for: Colorectal cancer. Prostate cancer. Skin cancer. Lung  cancer. What should I know about heart disease, diabetes, and high blood pressure? Blood pressure and heart disease High blood pressure causes heart disease and increases the risk of stroke. This is more likely to develop in people who have high blood pressure readings, are of African descent, or are overweight. Talk with your health care provider about your target blood pressure readings. Have your blood pressure checked: Every 3-5 years if you are 18-39 years of age. Every year if you are 40 years old or older. If you are between the ages of 65 and 75 and are a current or former smoker, ask your health care provider if you should have a one-time screening for abdominal aortic aneurysm (AAA). Diabetes Have regular diabetes screenings. This checks your fasting blood sugar level. Have the screening done: Once every three years after age 45 if you are at a normal weight and have a low risk for diabetes. More often and at a younger age if you are overweight or have a high risk for diabetes. What should I know about preventing infection? Hepatitis B If you have a higher risk for hepatitis B, you should be screened for this virus. Talk with your health care provider to find out if you are at risk for hepatitis B infection. Hepatitis C Blood testing is recommended for: Everyone born from 1945 through 1965. Anyone with known risk factors for hepatitis C. Sexually transmitted infections (STIs) You should be screened each year for STIs, including gonorrhea and chlamydia, if: You are sexually active and are younger than 57 years of age. You are older than 57 years   of age and your health care provider tells you that you are at risk for this type of infection. Your sexual activity has changed since you were last screened, and you are at increased risk for chlamydia or gonorrhea. Ask your health care provider if you are at risk. Ask your health care provider about whether you are at high risk for HIV.  Your health care provider may recommend a prescription medicine to help prevent HIV infection. If you choose to take medicine to prevent HIV, you should first get tested for HIV. You should then be tested every 3 months for as long as you are taking the medicine. Follow these instructions at home: Lifestyle Do not use any products that contain nicotine or tobacco, such as cigarettes, e-cigarettes, and chewing tobacco. If you need help quitting, ask your health care provider. Do not use street drugs. Do not share needles. Ask your health care provider for help if you need support or information about quitting drugs. Alcohol use Do not drink alcohol if your health care provider tells you not to drink. If you drink alcohol: Limit how much you have to 0-2 drinks a day. Be aware of how much alcohol is in your drink. In the [place] one drink equals one 12 oz bottle of beer (355 mL), one 5 oz glass of wine (148 mL), or one 1 oz glass of hard liquor (44 mL). General instructions Schedule regular health, dental, and eye exams. Stay current with your vaccines. Tell your health care provider if: You often feel depressed. You have ever been abused or do not feel safe at home. Summary Adopting a healthy lifestyle and getting preventive care are important in promoting health and wellness. Follow your health care provider's instructions about healthy diet, exercising, and getting tested or screened for diseases. Follow your health care provider's instructions on monitoring your cholesterol and blood pressure. This information is not intended to replace advice given to you by your health care provider. Make sure you discuss any questions you have with your health care provider. Document Revised: 12/26/2020 Document Reviewed: 10/11/2018 Elsevier Patient Education  2022 Elsevier Inc.  

## 2021-10-09 ENCOUNTER — Other Ambulatory Visit: Payer: Self-pay

## 2021-10-09 ENCOUNTER — Encounter: Payer: Self-pay | Admitting: Nurse Practitioner

## 2021-10-09 ENCOUNTER — Ambulatory Visit (INDEPENDENT_AMBULATORY_CARE_PROVIDER_SITE_OTHER): Payer: Medicare Other | Admitting: Nurse Practitioner

## 2021-10-09 VITALS — BP 123/70 | HR 74 | Temp 97.8°F | Ht 66.0 in | Wt 124.6 lb

## 2021-10-09 DIAGNOSIS — R350 Frequency of micturition: Secondary | ICD-10-CM | POA: Diagnosis not present

## 2021-10-09 DIAGNOSIS — R531 Weakness: Secondary | ICD-10-CM

## 2021-10-09 DIAGNOSIS — I739 Peripheral vascular disease, unspecified: Secondary | ICD-10-CM

## 2021-10-09 DIAGNOSIS — Z720 Tobacco use: Secondary | ICD-10-CM

## 2021-10-09 DIAGNOSIS — E559 Vitamin D deficiency, unspecified: Secondary | ICD-10-CM

## 2021-10-09 DIAGNOSIS — E785 Hyperlipidemia, unspecified: Secondary | ICD-10-CM

## 2021-10-09 DIAGNOSIS — R634 Abnormal weight loss: Secondary | ICD-10-CM

## 2021-10-09 MED ORDER — ASPIRIN 81 MG PO TBEC
81.0000 mg | DELAYED_RELEASE_TABLET | Freq: Every day | ORAL | 3 refills | Status: DC
  Filled 2021-10-09: qty 30, 30d supply, fill #0

## 2021-10-09 MED ORDER — GABAPENTIN 300 MG PO CAPS
ORAL_CAPSULE | ORAL | 1 refills | Status: DC
  Filled 2021-10-09: qty 180, 90d supply, fill #0

## 2021-10-09 MED ORDER — OXYBUTYNIN CHLORIDE ER 10 MG PO TB24
10.0000 mg | ORAL_TABLET | Freq: Every day | ORAL | 3 refills | Status: AC
  Filled 2021-10-09 – 2022-01-26 (×3): qty 90, 90d supply, fill #0
  Filled 2022-04-22: qty 90, 90d supply, fill #1
  Filled 2022-08-02: qty 90, 90d supply, fill #2

## 2021-10-09 NOTE — Progress Notes (Signed)
Integrated Behavioral Health General Follow Up Note  10/09/2021 Name: Daniel Stuart MRN: [id number] DOB: May 27, 1966 Daniel Stuart is a 55 y.o. year old male who sees No primary care provider on file. for primary care. LCSW was consulted to assist the patient with community resources.  Interpreter: No.   Interpreter Name & Language: none  Assessment: Patient experiencing food insecurity. He also has challenges preparing his food, due to history of stroke.  Ongoing Intervention: Patient's sister called earlier this week with questions about home health PT referral for patient. She also had questions about his insurance coverage and if there is a plan that might help cover his medications better. Referred them to [employer] Information Program [facility]) at that time. Referred to PCP for new home health referral, as PCP had already made a referral earlier this year.  Visited patient during PCP visit today. Sister had accompanied him to the appointment. Patient using wheelchair. PCP did make new referral to home health. Sister confirmed they followed up with [facility] for information on health plans.  Review of patient status, including review of consultants reports, relevant laboratory and other test results, and collaboration with appropriate care team members and the patient's provider was performed as part of comprehensive patient evaluation and provision of services.    Abigail Butts, LCSW [facility] [phone]

## 2021-10-09 NOTE — Progress Notes (Signed)
[place], [place]  [address] Phone:  [phone]   Fax:  [phone]    Established Patient Office Visit  Subjective:  Patient ID: Daniel Stuart, male    DOB: 1966-11-01  Age: 55 y.o. MRN: [id number]  CC:  Chief Complaint  Patient presents with   Follow-up    Pt is here today for his follow up visit.  Pt states that he fallen several times since his last visit.  Pt sister is concerned also states that she has noticed that his right foot drags when he walks with some swelling in his right ankle. Pt sister is concerned about his memory but is going to get him an appt to see his neurologist.     HPI Daniel Stuart presents for follow up. He is in with his sister. She  has a past medical history of Anxiety, Arrhythmia as indication for cardiac pacemaker replacement (03/2020), Frequent falls (03/2020), History of loop recorder (04/2017), Stroke (cerebrum) ([place]), Tobacco use, Urinary frequency (09/2019), and Vitamin D deficiency.   She reports problems with articulation. She also feels like his sense of direction has worsen. He is unable to complete instructions. He is miss judging the distance. Which has results in several falls.    He is unable to walk without assist . She is concern with some recent changes. He has a history of right sided weakness.  He has swelling in his ankle. He is right foot is dragging. He continues to have falls; 3 this month.   He is unable to prepare his food. He does no like Ensure/boost supplement.  He denies SOB and chest pain. He does not treat the constipation. His BM are changing. He is not using any OTC treatment.   A referral to home health 3 mons however there was no communication.  Past Medical History:  Diagnosis Date   Anxiety    Arrhythmia as indication for cardiac pacemaker replacement 03/2020   Frequent falls 03/2020   History of loop recorder 04/2017   Stroke (cerebrum) (HCC)     Tobacco use    Urinary frequency 09/2019   Vitamin D deficiency     Past Surgical History:  Procedure Laterality Date   FOOT SURGERY     LOOP RECORDER INSERTION N/A 04/13/2017   Procedure: Loop Recorder Insertion;  Surgeon: Evans Lance, MD;  Location: [facility];  Service: Cardiovascular;  Laterality: N/A;   TEE WITHOUT CARDIOVERSION N/A 04/13/2017   Procedure: TRANSESOPHAGEAL ECHOCARDIOGRAM (TEE);  Surgeon: Jerline Pain, MD;  Location: [facility] ENDOSCOPY;  Service: Cardiovascular;  Laterality: N/A;    Family History  Problem Relation Age of Onset   Diabetes Mother    Diabetes Father    Heart failure Father    Cancer Sister     Social History   Socioeconomic History   Marital status: Legally Separated    Spouse name: Not on file   Number of children: 1   Years of education: Not on file   Highest education level: Some college, no degree  Occupational History   Occupation: disabled  Tobacco Use   Smoking status: Every Day    Packs/day: 1.00    Types: Cigarettes    Start date: 11/01/1977   Smokeless tobacco: Never  Vaping Use   Vaping Use: Never used  Substance and Sexual Activity   Alcohol use: No   Drug use: No   Sexual activity: Not Currently  Partners: Female  Other Topics Concern   Not on file  Social History Narrative   Patient is right-handed. He lives in a one level home with his mother since CVA. He drinks one cup of coffee and 2-3 sodas a day. He walks when weather permitting.   Social Determinants of Health   Financial Resource Strain: Medium Risk   Difficulty of Paying Living Expenses: Somewhat hard  Food Insecurity: No Food Insecurity   Worried About Charity fundraiser in the Last Year: Never true   Ran Out of Food in the Last Year: Never true  Transportation Needs: No Transportation Needs   Lack of Transportation (Medical): No   Lack of Transportation (Non-Medical): No  Physical Activity: Inactive   Days of Exercise per Week: 0 days    Minutes of Exercise per Session: 0 min  Stress: No Stress Concern Present   Feeling of Stress : Not at all  Social Connections: Socially Isolated   Frequency of Communication with Friends and Family: Three times a week   Frequency of Social Gatherings with Friends and Family: Never   Attends Religious Services: Never   Marine scientist or Organizations: No   Attends Music therapist: Never   Marital Status: Separated  Intimate Partner Violence: Not At Risk   Fear of Current or Ex-Partner: No   Emotionally Abused: No   Physically Abused: No   Sexually Abused: No    Outpatient Medications Prior to Visit  Medication Sig Dispense Refill   atorvastatin (LIPITOR) 80 MG tablet TAKE 1 TABLET (80 MG TOTAL) BY MOUTH EVERY EVENING. 90 tablet 3   gabapentin (NEURONTIN) 300 MG capsule TAKE 1 CAPSULE BY MOUTH BY MOUTH TWO TIMES DAILY 180 capsule 3   gabapentin (NEURONTIN) 300 MG capsule TAKE 1 CAPSULE BY MOUTH BY MOUTH TWO TIMES DAILY 180 capsule 1   oxybutynin (DITROPAN-XL) 10 MG 24 hr tablet TAKE 1 TABLET (10 MG TOTAL) BY MOUTH AT BEDTIME. 90 tablet 3   busPIRone (BUSPAR) 5 MG tablet TAKE 1 TABLET (5 MG TOTAL) BY MOUTH 2 (TWO) TIMES DAILY. (Patient not taking: Reported on 07/10/2021) 60 tablet 6   aspirin 81 MG EC tablet Take 1 tablet (81 mg total) by mouth daily. (Patient not taking: Reported on 10/09/2021) 30 tablet 3   No facility-administered medications prior to visit.    No Known Allergies  ROS Review of Systems  Neurological:  Negative for dizziness and headaches.     Objective:    Physical Exam HENT:     Head: Normocephalic and atraumatic.     Nose: Nose normal.     Mouth/Throat:     Mouth: Mucous membranes are moist.  Cardiovascular:     Rate and Rhythm: Normal rate and regular rhythm.     Pulses: Normal pulses.     Heart sounds: Normal heart sounds.  Pulmonary:     Effort: Pulmonary effort is normal.     Comments: diminshed Abdominal:     Palpations:  Abdomen is soft.     Comments: hypoactive  Musculoskeletal:     Cervical back: Normal range of motion.     Right lower leg: No edema.     Left lower leg: No edema.     Comments:  Right leg weakness with flexion and extension  Skin:    General: Skin is warm and dry.     Capillary Refill: Capillary refill takes less than 2 seconds.  Neurological:     General: No  focal deficit present.     Mental Status: He is alert and oriented to person, place, and time.     Comments: aphasia  Psychiatric:        Mood and Affect: Mood normal.        Behavior: Behavior normal.    BP 123/70   Pulse 74   Temp 97.8 F (36.6 C)   Ht 5' 6"  (1.676 m)   Wt 124 lb 9.6 oz (56.5 kg)   SpO2 100%   BMI 20.11 kg/m  Wt Readings from Last 3 Encounters:  10/09/21 124 lb 9.6 oz (56.5 kg)  07/10/21 131 lb (59.4 kg)  04/22/21 130 lb (59 kg)     Health Maintenance Due  Topic Date Due   COVID-19 Vaccine (3 - Booster for Pfizer series) 04/21/2020    There are no preventive care reminders to display for this patient.  Lab Results  Component Value Date   TSH 1.520 06/11/2020   Lab Results  Component Value Date   WBC 11.0 (H) 06/11/2020   HGB 15.3 06/11/2020   HCT 44.9 06/11/2020   MCV 93 06/11/2020   PLT 230 06/11/2020   Lab Results  Component Value Date   NA 139 07/10/2021   K 4.2 07/10/2021   CO2 22 06/11/2020   GLUCOSE 91 07/10/2021   BUN 5 (L) 07/10/2021   CREATININE 1.22 07/10/2021   BILITOT 0.7 07/10/2021   ALKPHOS 112 07/10/2021   AST 19 07/10/2021   ALT 15 06/11/2020   PROT 7.9 07/10/2021   ALBUMIN 4.5 07/10/2021   CALCIUM 9.5 07/10/2021   ANIONGAP 10 04/13/2017   EGFR 69 07/10/2021   Lab Results  Component Value Date   CHOL 106 07/10/2021   Lab Results  Component Value Date   HDL 45 07/10/2021   Lab Results  Component Value Date   LDLCALC 47 07/10/2021   Lab Results  Component Value Date   TRIG 67 07/10/2021   Lab Results  Component Value Date   CHOLHDL 2.4  07/10/2021   Lab Results  Component Value Date   HGBA1C 5.3 03/11/2020      Assessment & Plan:   Problem List Items Addressed This Visit       Nervous and Auditory   Right sided weakness Persistent   Relevant Medications   gabapentin (NEURONTIN) 300 MG capsule   Other Relevant Orders   Ambulatory referral to [facility]     Other   Hyperlipidemia - Primary Stable  Continue with current regimen.  No changes warranted. Good patient compliance.    Relevant Medications   aspirin 81 MG EC tablet   Other Relevant Orders   Lipid panel   Vitamin D deficiency   Relevant Orders   VITAMIN D 25 Hydroxy (Vit-D Deficiency, Fractures)   Urinary frequency   Relevant Medications   oxybutynin (DITROPAN-XL) 10 MG 24 hr tablet   Other Visit Diagnoses     PAD (peripheral artery disease) (HCC)   (Chronic)     Relevant Medications   aspirin 81 MG EC tablet   [facility] Relevant Orders   Comp. Metabolic Panel (12)   Weight loss     Education provided    Relevant Orders   TSH   Tobacco Abuse Discussed the risk factors associated with smoking ; CAD, COPD, Cancer, PVD increased susceptibility to respiratory illnesses Discussed treatment options with cessation ie counseling, support resources and available medications  Choosing a quit day and setting goals accordingly. Discussed ways to quit;  start by decreasing one cigarette per day or per week.  Encourage patient to call for assistance once ready to quit. Provided education handouts   Counseling 5-10 minutes     Meds ordered this encounter  Medications   aspirin 81 MG EC tablet    Sig: Take 1 tablet (81 mg total) by mouth daily.    Dispense:  30 tablet    Refill:  3   gabapentin (NEURONTIN) 300 MG capsule    Sig: TAKE 1 CAPSULE BY MOUTH BY MOUTH TWO TIMES DAILY    Dispense:  180 capsule    Refill:  1   oxybutynin (DITROPAN-XL) 10 MG 24 hr tablet    Sig: Take 1 tablet (10 mg total) by mouth at bedtime.    Dispense:  90 tablet     Refill:  3    Follow-up: Return in about 3 months (around 01/07/2022).    Vevelyn Francois, NP

## 2021-10-10 LAB — COMP. METABOLIC PANEL (12)
AST: 16 IU/L (ref 0–40)
Albumin/Globulin Ratio: 1.3 (ref 1.2–2.2)
Albumin: 4 g/dL (ref 3.8–4.9)
Alkaline Phosphatase: 114 IU/L (ref 44–121)
BUN/Creatinine Ratio: 4 — ABNORMAL LOW (ref 9–20)
BUN: 4 mg/dL — ABNORMAL LOW (ref 6–24)
Bilirubin Total: 0.4 mg/dL (ref 0.0–1.2)
Calcium: 9.1 mg/dL (ref 8.7–10.2)
Chloride: 101 mmol/L (ref 96–106)
Creatinine, Ser: 1.13 mg/dL (ref 0.76–1.27)
Globulin, Total: 3.1 g/dL (ref 1.5–4.5)
Glucose: 57 mg/dL — ABNORMAL LOW (ref 70–99)
Potassium: 4.1 mmol/L (ref 3.5–5.2)
Sodium: 143 mmol/L (ref 134–144)
Total Protein: 7.1 g/dL (ref 6.0–8.5)
eGFR: 76 mL/min/{1.73_m2} (ref >59)

## 2021-10-10 LAB — LIPID PANEL
Chol/HDL Ratio: 3.1 ratio (ref 0.0–5.0)
Cholesterol, Total: 114 mg/dL (ref 100–199)
HDL: 37 mg/dL — ABNORMAL LOW (ref >39)
LDL Chol Calc (NIH): 60 mg/dL (ref 0–99)
Triglycerides: 85 mg/dL (ref 0–149)
VLDL Cholesterol Cal: 17 mg/dL (ref 5–40)

## 2021-10-10 LAB — VITAMIN D 25 HYDROXY (VIT D DEFICIENCY, FRACTURES): Vit D, 25-Hydroxy: 14.6 ng/mL — ABNORMAL LOW (ref 30.0–100.0)

## 2021-10-10 LAB — TSH: TSH: 0.702 u[IU]/mL (ref 0.450–4.500)

## 2021-10-12 ENCOUNTER — Other Ambulatory Visit: Payer: Self-pay

## 2021-10-13 ENCOUNTER — Other Ambulatory Visit: Payer: Self-pay

## 2021-10-13 ENCOUNTER — Other Ambulatory Visit: Payer: Self-pay | Admitting: Nurse Practitioner

## 2021-10-13 MED ORDER — ERGOCALCIFEROL 1.25 MG (50000 UT) PO CAPS
50000.0000 [IU] | ORAL_CAPSULE | ORAL | 0 refills | Status: AC
  Filled 2021-10-13 – 2022-01-26 (×3): qty 4, 28d supply, fill #0
  Filled 2022-04-22: qty 4, 28d supply, fill #1

## 2021-11-18 ENCOUNTER — Other Ambulatory Visit: Payer: Self-pay | Admitting: Nurse Practitioner

## 2021-11-18 DIAGNOSIS — R531 Weakness: Secondary | ICD-10-CM

## 2021-11-18 DIAGNOSIS — R634 Abnormal weight loss: Secondary | ICD-10-CM

## 2021-11-18 DIAGNOSIS — I739 Peripheral vascular disease, unspecified: Secondary | ICD-10-CM

## 2021-11-18 DIAGNOSIS — Z8673 Personal history of transient ischemic attack (TIA), and cerebral infarction without residual deficits: Secondary | ICD-10-CM

## 2021-11-18 NOTE — Progress Notes (Signed)
   [facility] [address] [place], [place]  [address] Phone:  [phone]   Fax:  [phone] 

## 2021-12-11 ENCOUNTER — Telehealth: Payer: Self-pay | Admitting: Nurse Practitioner

## 2021-12-11 NOTE — Telephone Encounter (Signed)
Brooke from [facility] called to request a verbal order for home nurse visits once a week for 5 weeks.  Brooke: [id number]

## 2021-12-14 ENCOUNTER — Telehealth: Payer: Self-pay

## 2021-12-28 NOTE — Telephone Encounter (Signed)
No additional notes needed  

## 2022-01-07 ENCOUNTER — Ambulatory Visit: Payer: Medicare Other | Admitting: Nurse Practitioner

## 2022-01-11 ENCOUNTER — Ambulatory Visit (INDEPENDENT_AMBULATORY_CARE_PROVIDER_SITE_OTHER): Payer: Medicare Other | Admitting: Nurse Practitioner

## 2022-01-11 ENCOUNTER — Other Ambulatory Visit: Payer: Self-pay

## 2022-01-11 ENCOUNTER — Encounter: Payer: Self-pay | Admitting: Nurse Practitioner

## 2022-01-11 VITALS — BP 118/82 | HR 81 | Temp 98.0°F | Ht 66.0 in

## 2022-01-11 DIAGNOSIS — E785 Hyperlipidemia, unspecified: Secondary | ICD-10-CM | POA: Diagnosis not present

## 2022-01-11 DIAGNOSIS — Z1329 Encounter for screening for other suspected endocrine disorder: Secondary | ICD-10-CM

## 2022-01-11 DIAGNOSIS — R531 Weakness: Secondary | ICD-10-CM

## 2022-01-11 DIAGNOSIS — E538 Deficiency of other specified B group vitamins: Secondary | ICD-10-CM | POA: Diagnosis not present

## 2022-01-11 DIAGNOSIS — I499 Cardiac arrhythmia, unspecified: Secondary | ICD-10-CM

## 2022-01-11 DIAGNOSIS — I6310 Cerebral infarction due to embolism of unspecified precerebral artery: Secondary | ICD-10-CM

## 2022-01-11 NOTE — Progress Notes (Signed)
[facility] [address] [place], [place]  [address] Phone:  [phone]   Fax:  [phone]   Established Patient Office Visit  Subjective:  Patient ID: Daniel Stuart, male    DOB: July 22, 1966  Age: 56 y.o. MRN: [id number]  CC:  Chief Complaint  Patient presents with   Follow-up    Pt is here for 3 month follow up. Pt stated he has pressures sores on his buttocks for the past 2 weeks    HPI Daniel Stuart presents for follow up. She   has a past medical history of Anxiety, Arrhythmia as indication for cardiac pacemaker replacement (03/2020), Frequent falls (03/2020), History of loop recorder (04/2017), Stroke (cerebrum) (HCC), Tobacco use, Urinary frequency (09/2019), and Vitamin D deficiency.   He is in today to follow-up with sister.  He left his rollator at home. He has in home therapy for PT and nursing care. He has had a little home therapy which has not been effective.  CNA has not shown up.   He reports an area to the right buttock. He describes it as bump. That is turning in to sore. He has not treated this with anything.  He is does not have appetite. He has frozen meals but they are not prepared by him. He generally opts to eat potato chips. He has started protein shakes; likes chocolate.   He reports that he is taking his medication as directed, atorvastatin 80 mg and gabapentin without problems.Marland Kitchen  He is also taking oxybutynin 10 mg.  The sister in injuring about a wheelchair. He has a Museum/gallery exhibitions officer; however this is not effective with ADL's. He comfortable in his surrounding .  He continues to have delays with performing task.  His sister feels like its and inability to determine what is being asked.  He has not been able to see the neurologist. He needs to follow up.    Past Medical History:  Diagnosis Date   Anxiety    Arrhythmia as indication for cardiac pacemaker replacement 03/2020   Frequent falls 03/2020   History of loop recorder  04/2017   Stroke (cerebrum) (HCC)    Tobacco use    Urinary frequency 09/2019   Vitamin D deficiency     Past Surgical History:  Procedure Laterality Date   FOOT SURGERY     LOOP RECORDER INSERTION N/A 04/13/2017   Procedure: Loop Recorder Insertion;  Surgeon: Marinus Maw, MD;  Location: [facility] INVASIVE CV LAB;  Service: Cardiovascular;  Laterality: N/A;   TEE WITHOUT CARDIOVERSION N/A 04/13/2017   Procedure: TRANSESOPHAGEAL ECHOCARDIOGRAM (TEE);  Surgeon: Jake Bathe, MD;  Location: [facility] ENDOSCOPY;  Service: Cardiovascular;  Laterality: N/A;    Family History  Problem Relation Age of Onset   Diabetes Mother    Diabetes Father    Heart failure Father    Cancer Sister     Social History   Socioeconomic History   Marital status: Legally Separated    Spouse name: Not on file   Number of children: 1   Years of education: Not on file   Highest education level: Some college, no degree  Occupational History   Occupation: disabled  Tobacco Use   Smoking status: Every Day    Packs/day: 1.00    Types: Cigarettes    Start date: 11/01/1977   Smokeless tobacco: Never  Vaping Use   Vaping Use: Never used  Substance and Sexual Activity   Alcohol use: No  Drug use: No   Sexual activity: Not Currently    Partners: Female  Other Topics Concern   Not on file  Social History Narrative   Patient is right-handed. He lives in a one level home with his mother since CVA. He drinks one cup of coffee and 2-3 sodas a day. He walks when weather permitting.   Social Determinants of Health   Financial Resource Strain: Medium Risk   Difficulty of Paying Living Expenses: Somewhat hard  Food Insecurity: No Food Insecurity   Worried About Programme researcher, broadcasting/film/video in the Last Year: Never true   Ran Out of Food in the Last Year: Never true  Transportation Needs: No Transportation Needs   Lack of Transportation (Medical): No   Lack of Transportation (Non-Medical): No  Physical Activity: Inactive    Days of Exercise per Week: 0 days   Minutes of Exercise per Session: 0 min  Stress: No Stress Concern Present   Feeling of Stress : Not at all  Social Connections: Socially Isolated   Frequency of Communication with Friends and Family: Three times a week   Frequency of Social Gatherings with Friends and Family: Never   Attends Religious Services: Never   Database administrator or Organizations: No   Attends Engineer, structural: Never   Marital Status: Separated  Intimate Partner Violence: Not At Risk   Fear of Current or Ex-Partner: No   Emotionally Abused: No   Physically Abused: No   Sexually Abused: No    Outpatient Medications Prior to Visit  Medication Sig Dispense Refill   aspirin 81 MG EC tablet Take 1 tablet (81 mg total) by mouth daily. 30 tablet 3   atorvastatin (LIPITOR) 80 MG tablet TAKE 1 TABLET (80 MG TOTAL) BY MOUTH EVERY EVENING. 90 tablet 3   gabapentin (NEURONTIN) 300 MG capsule TAKE 1 CAPSULE BY MOUTH BY MOUTH TWO TIMES DAILY 180 capsule 1   oxybutynin (DITROPAN-XL) 10 MG 24 hr tablet Take 1 tablet (10 mg total) by mouth at bedtime. 90 tablet 3   ergocalciferol (VITAMIN D2) 1.25 MG (50000 UT) capsule Take 1 capsule (50,000 Units total) by mouth once a week for 12 weeks. (Patient not taking: Reported on 01/11/2022) 12 capsule 0   gabapentin (NEURONTIN) 300 MG capsule TAKE 1 CAPSULE BY MOUTH BY MOUTH TWO TIMES DAILY 180 capsule 3   No facility-administered medications prior to visit.    No Known Allergies  ROS Review of Systems    Objective:    Physical Exam Constitutional:      General: He is not in acute distress.    Appearance: He is not toxic-appearing.  HENT:     Head: Normocephalic and atraumatic.  Cardiovascular:     Rate and Rhythm: Normal rate. Rhythm irregular.  Pulmonary:     Effort: Pulmonary effort is normal.     Comments: Diminished Abdominal:     Palpations: Abdomen is soft.     Comments: Hypoactive  Musculoskeletal:         General: No swelling or tenderness.     Cervical back: Normal range of motion.     Right lower leg: No edema.     Left lower leg: No edema.     Comments: Bilateral lower extremity weakness  Skin:    General: Skin is warm and dry.     Capillary Refill: Capillary refill takes less than 2 seconds.     Comments: Scabbed area to right buttock no signs of infection  Neurological:     General: No focal deficit present.     Mental Status: He is alert.     Motor: Weakness present.     Gait: Gait abnormal.  Psychiatric:        Mood and Affect: Mood normal.        Behavior: Behavior normal.    BP 118/82   Pulse 81   Temp 98 F (36.7 C)   Ht 5\' 6"  (1.676 m)   SpO2 100%   BMI 20.11 kg/m  Wt Readings from Last 3 Encounters:  10/09/21 124 lb 9.6 oz (56.5 kg)  07/10/21 131 lb (59.4 kg)  04/22/21 130 lb (59 kg)     Health Maintenance Due  Topic Date Due   Zoster Vaccines- Shingrix (1 of 2) Never done   COVID-19 Vaccine (3 - Booster for Pfizer series) 04/21/2020    There are no preventive care reminders to display for this patient.  Lab Results  Component Value Date   TSH 0.702 10/09/2021   Lab Results  Component Value Date   WBC 11.0 (H) 06/11/2020   HGB 15.3 06/11/2020   HCT 44.9 06/11/2020   MCV 93 06/11/2020   PLT 230 06/11/2020   Lab Results  Component Value Date   NA 143 10/09/2021   K 4.1 10/09/2021   CO2 22 06/11/2020   GLUCOSE 57 (L) 10/09/2021   BUN 4 (L) 10/09/2021   CREATININE 1.13 10/09/2021   BILITOT 0.4 10/09/2021   ALKPHOS 114 10/09/2021   AST 16 10/09/2021   ALT 15 06/11/2020   PROT 7.1 10/09/2021   ALBUMIN 4.0 10/09/2021   CALCIUM 9.1 10/09/2021   ANIONGAP 10 04/13/2017   EGFR 76 10/09/2021   Lab Results  Component Value Date   CHOL 114 10/09/2021   Lab Results  Component Value Date   HDL 37 (L) 10/09/2021   Lab Results  Component Value Date   LDLCALC 60 10/09/2021   Lab Results  Component Value Date   TRIG 85 10/09/2021   Lab  Results  Component Value Date   CHOLHDL 3.1 10/09/2021   Lab Results  Component Value Date   HGBA1C 5.3 03/11/2020      Assessment & Plan:   Problem List Items Addressed This Visit       Cardiovascular and Mediastinum   CVA (cerebral vascular accident) [facility])   Relevant Orders   DME Wheelchair manual   Shower chair   Arrhythmia as indication for cardiac pacemaker replacement Stable   Relevant Orders   Comp. Metabolic Panel (12)   TSH     Nervous and Auditory   Right sided weakness - Primary Worsening Home health referral in place   Relevant Orders   DME Wheelchair manual   Shower chair     Other   Hyperlipidemia Stable  Encouraged on going compliance with current medication regimen. I recommend eating a heart-healthy diet; low in fat and cholesterol.    Relevant Orders   Lipid panel   Other Visit Diagnoses     Vitamin B12 deficiency       Screening for thyroid disorder           No orders of the defined types were placed in this encounter.   Follow-up: No follow-ups on file.    Barbette Merino, NP

## 2022-01-12 ENCOUNTER — Telehealth: Payer: Self-pay | Admitting: Clinical

## 2022-01-12 LAB — COMP. METABOLIC PANEL (12)
AST: 14 IU/L (ref 0–40)
Albumin/Globulin Ratio: 1.2 (ref 1.2–2.2)
Albumin: 4.2 g/dL (ref 3.8–4.9)
Alkaline Phosphatase: 99 IU/L (ref 44–121)
BUN/Creatinine Ratio: 5 — ABNORMAL LOW (ref 9–20)
BUN: 5 mg/dL — ABNORMAL LOW (ref 6–24)
Bilirubin Total: 0.4 mg/dL (ref 0.0–1.2)
Calcium: 9.2 mg/dL (ref 8.7–10.2)
Chloride: 102 mmol/L (ref 96–106)
Creatinine, Ser: 1.08 mg/dL (ref 0.76–1.27)
Globulin, Total: 3.4 g/dL (ref 1.5–4.5)
Glucose: 63 mg/dL — ABNORMAL LOW (ref 70–99)
Potassium: 4 mmol/L (ref 3.5–5.2)
Sodium: 140 mmol/L (ref 134–144)
Total Protein: 7.6 g/dL (ref 6.0–8.5)
eGFR: 80 mL/min/{1.73_m2} (ref >59)

## 2022-01-12 LAB — TSH: TSH: 1.34 u[IU]/mL (ref 0.450–4.500)

## 2022-01-12 LAB — LIPID PANEL
Chol/HDL Ratio: 3.8 ratio (ref 0.0–5.0)
Cholesterol, Total: 163 mg/dL (ref 100–199)
HDL: 43 mg/dL (ref >39)
LDL Chol Calc (NIH): 103 mg/dL — ABNORMAL HIGH (ref 0–99)
Triglycerides: 93 mg/dL (ref 0–149)
VLDL Cholesterol Cal: 17 mg/dL (ref 5–40)

## 2022-01-12 NOTE — Telephone Encounter (Signed)
[facility] ?General Follow Up Note ? ?01/12/2022 ?Name: Daniel Stuart MRN: [id number] DOB: 01-11-1966 ?Daniel Stuart is a 56 y.o. year old male who sees Vevelyn Francois, NP for primary care. LCSW was consulted to assist the patient with community resources. ? ?Interpreter: No.   Interpreter Name & Language: none ? ?Assessment: Patient experiencing difficulty in managing ADLs independently, including food preparation and bathing. ? ?Ongoing Intervention: Called patient and sister, Daniel Stuart, to follow up on visit with PCP yesterday. Patient and sister had questions about inpatient physical therapy rehab. Advised them that generally an inpatient hospital stay would be needed prior to nursing facility stay for rehab. Patient is receiving home health PT and aide services; advised patient and sister to follow up with the agency for additional evaluation and options for rehab. Also discussed the option of assisted living facilities with them. Mailed patient list of assisted living facilities in [place]. ? ?Review of patient status, including review of consultants reports, relevant laboratory and other test results, and collaboration with appropriate care team members and the patient's provider was performed as part of comprehensive patient evaluation and provision of services.   ? ?Estanislado Emms, LCSW ?Patient [place] ?[place] ?[phone] ?  ?

## 2022-01-14 ENCOUNTER — Telehealth: Payer: Self-pay

## 2022-01-14 ENCOUNTER — Other Ambulatory Visit: Payer: Self-pay | Admitting: Nurse Practitioner

## 2022-01-14 NOTE — Progress Notes (Signed)
   [facility] [address] [place], [place]  [address] Phone:  [phone]   Fax:  [phone] 

## 2022-01-18 ENCOUNTER — Other Ambulatory Visit: Payer: Self-pay

## 2022-01-20 NOTE — Telephone Encounter (Signed)
No additional notes needed  

## 2022-01-21 ENCOUNTER — Telehealth: Payer: Self-pay | Admitting: Cardiology

## 2022-01-21 NOTE — Telephone Encounter (Signed)
Patient states he is not interested in scheduling follow up appointments with the office. ?

## 2022-01-25 ENCOUNTER — Other Ambulatory Visit: Payer: Self-pay

## 2022-01-26 ENCOUNTER — Other Ambulatory Visit: Payer: Self-pay

## 2022-01-26 ENCOUNTER — Other Ambulatory Visit: Payer: Self-pay | Admitting: Nurse Practitioner

## 2022-01-26 DIAGNOSIS — R531 Weakness: Secondary | ICD-10-CM

## 2022-01-27 ENCOUNTER — Other Ambulatory Visit: Payer: Self-pay

## 2022-01-28 ENCOUNTER — Telehealth: Payer: Self-pay

## 2022-01-28 NOTE — Telephone Encounter (Signed)
Gabapentin

## 2022-01-29 ENCOUNTER — Telehealth: Payer: Self-pay | Admitting: Clinical

## 2022-01-29 ENCOUNTER — Other Ambulatory Visit: Payer: Self-pay

## 2022-01-29 NOTE — Telephone Encounter (Signed)
[facility] ?General Follow Up Note ? ?01/29/2022 ?Name: Daniel Stuart MRN: [id number] DOB: 27-May-1966 ?Daniel Stuart is a 56 y.o. year old male who sees Barbette Merino, NP for primary care. LCSW was consulted to assist the patient with community resources. ? ?Interpreter: No.   Interpreter Name & Language: none ? ?Assessment: Patient experiencing difficulty in managing ADLs independently, including food preparation and bathing. ? ?Ongoing Intervention: Patient's sister, [name], called CSW today. She requested assistance with gabapentin refill, noting that it appeared PCP had discontinued this medication at last visit, although they had discussed a refill at that time, as the medication is helpful for patient. She also had questions about obtaining a wheelchair for patient, as it will be easier for him to maneuver in this around the home. Forwarded these requests to covering provider and will follow for support.  ? ?Review of patient status, including review of consultants reports, relevant laboratory and other test results, and collaboration with appropriate care team members and the patient's provider was performed as part of comprehensive patient evaluation and provision of services.   ? ?Abigail Butts, LCSW ?Patient Care Center ?[facility] ?[phone] ?  ?

## 2022-02-01 ENCOUNTER — Other Ambulatory Visit: Payer: Self-pay

## 2022-02-02 ENCOUNTER — Other Ambulatory Visit: Payer: Self-pay | Admitting: Internal Medicine

## 2022-02-02 ENCOUNTER — Other Ambulatory Visit: Payer: Self-pay

## 2022-02-02 DIAGNOSIS — R531 Weakness: Secondary | ICD-10-CM

## 2022-02-02 MED ORDER — GABAPENTIN 300 MG PO CAPS
ORAL_CAPSULE | ORAL | 3 refills | Status: DC
  Filled 2022-02-02: qty 180, 90d supply, fill #0

## 2022-02-04 ENCOUNTER — Other Ambulatory Visit: Payer: Self-pay

## 2022-02-18 ENCOUNTER — Telehealth: Payer: Self-pay

## 2022-02-22 ENCOUNTER — Encounter: Payer: Self-pay | Admitting: Nurse Practitioner

## 2022-02-22 ENCOUNTER — Other Ambulatory Visit: Payer: Self-pay

## 2022-02-22 ENCOUNTER — Ambulatory Visit (INDEPENDENT_AMBULATORY_CARE_PROVIDER_SITE_OTHER): Payer: Medicare Other | Admitting: Nurse Practitioner

## 2022-02-22 VITALS — BP 113/69 | HR 63 | Temp 98.4°F | Ht 66.0 in | Wt 127.0 lb

## 2022-02-22 DIAGNOSIS — I6310 Cerebral infarction due to embolism of unspecified precerebral artery: Secondary | ICD-10-CM

## 2022-02-22 DIAGNOSIS — E785 Hyperlipidemia, unspecified: Secondary | ICD-10-CM | POA: Diagnosis not present

## 2022-02-22 DIAGNOSIS — Z760 Encounter for issue of repeat prescription: Secondary | ICD-10-CM | POA: Diagnosis not present

## 2022-02-22 DIAGNOSIS — R296 Repeated falls: Secondary | ICD-10-CM | POA: Diagnosis not present

## 2022-02-22 MED ORDER — ATORVASTATIN CALCIUM 80 MG PO TABS
ORAL_TABLET | ORAL | 3 refills | Status: DC
  Filled 2022-02-22: qty 90, 90d supply, fill #0
  Filled 2022-08-02: qty 90, 90d supply, fill #1

## 2022-02-22 NOTE — Progress Notes (Signed)
? ? [facility] ?[address] ?[place], [place]  [address] ?Phone:  [phone]   Fax:  [phone] ?Subjective:  ? Patient ID: Daniel Stuart, male    DOB: 1966-07-09, 56 y.o.   MRN: [id number] ? ?Chief Complaint  ?Patient presents with  ? OFFICE VISIT  ?  Pt stated he is here to get a wheel chair. Pt is requesting refill on atorvastatin  ? ?HPI ?Daniel Stuart 56 y.o. male  has a past medical history of Anxiety, Arrhythmia as indication for cardiac pacemaker replacement (03/2020), Frequent falls (03/2020), History of loop recorder (04/2017), Stroke (cerebrum) (HCC), Tobacco use, Urinary frequency (09/2019), and Vitamin D deficiency. To [facility] wheelchair and refill of cholesterol medication. Sister at bedside  ? ?Patient and family requesting wheelchair, states that patient has had a steady decline in mobility over the last several months. Transitioned from cane to walker and now requires wheelchair. Continues to complete PT regularly. Family at bedside requesting patient receive home health to help with ADLs. ? ?Patient also requesting refill of atorvastatin.  ? ?States that he maintains follow up with specialist(s), next follow up within the next month. Currently resides in independent living facility. Denies any other concerns today. ? ?Denies any fatigue, chest pain, shortness of breath, HA or dizziness. Denies any blurred vision, numbness or tingling. ? ?Past Medical History:  ?Diagnosis Date  ? Anxiety   ? Arrhythmia as indication for cardiac pacemaker replacement 03/2020  ? Frequent falls 03/2020  ? History of loop recorder 04/2017  ? Stroke (cerebrum) (HCC)   ? Tobacco use   ? Urinary frequency 09/2019  ? Vitamin D deficiency   ? ? ?Past Surgical History:  ?Procedure Laterality Date  ? FOOT SURGERY    ? LOOP RECORDER INSERTION N/A 04/13/2017  ? Procedure: Loop Recorder Insertion;  Surgeon: Marinus Mawaylor, Gregg W, MD;  [facility];  Service:  Cardiovascular;  Laterality: N/A;  ? TEE WITHOUT CARDIOVERSION N/A 04/13/2017  ? Procedure: TRANSESOPHAGEAL ECHOCARDIOGRAM (TEE);  Surgeon: Jake BatheSkains, Mark C, MD;  [facility];  Service: Cardiovascular;  Laterality: N/A;  ? ? ?Family History  ?Problem Relation Age of Onset  ? Diabetes Mother   ? Diabetes Father   ? Heart failure Father   ? Cancer Sister   ? ? ?Social History  ? ?Socioeconomic History  ? Marital status: Legally Separated  ?  Spouse name: Not on file  ? Number of children: 1  ? Years of education: Not on file  ? Highest education level: Some college, no degree  ?Occupational History  ? Occupation: disabled  ?Tobacco Use  ? Smoking status: Every Day  ?  Packs/day: 1.00  ?  Types: Cigarettes  ?  Start date: 11/01/1977  ? Smokeless tobacco: Never  ?Vaping Use  ? Vaping Use: Never used  ?Substance and Sexual Activity  ? Alcohol use: No  ? Drug use: No  ? Sexual activity: Not Currently  ?  Partners: Female  ?Other Topics Concern  ? Not on file  ?Social History Narrative  ? Patient is right-handed. He lives in a one level home with his mother since CVA. He drinks one cup of coffee and 2-3 sodas a day. He walks when weather permitting.  ? ?Social Determinants of Health  ? ?Financial Resource Strain: Medium Risk  ? Difficulty of Paying Living Expenses: Somewhat hard  ?Food Insecurity: No Food Insecurity  ? Worried About Programme researcher, broadcasting/film/videounning Out of Food in  the Last Year: Never true  ? Ran Out of Food in the Last Year: Never true  ?Transportation Needs: No Transportation Needs  ? Lack of Transportation (Medical): No  ? Lack of Transportation (Non-Medical): No  ?Physical Activity: Inactive  ? Days of Exercise per Week: 0 days  ? Minutes of Exercise per Session: 0 min  ?Stress: No Stress Concern Present  ? Feeling of Stress : Not at all  ?Social Connections: Socially Isolated  ? Frequency of Communication with Friends and Family: Three times a week  ? Frequency of Social Gatherings with Friends and Family: Never  ?  Attends Religious Services: Never  ? Active Member of Clubs or Organizations: No  ? Attends Banker Meetings: Never  ? Marital Status: Separated  ?Intimate Partner Violence: Not At Risk  ? Fear of Current or Ex-Partner: No  ? Emotionally Abused: No  ? Physically Abused: No  ? Sexually Abused: No  ? ? ?Outpatient Medications Prior to Visit  ?Medication Sig Dispense Refill  ? aspirin 81 MG EC tablet Take 1 tablet (81 mg total) by mouth daily. 30 tablet 3  ? gabapentin (NEURONTIN) 300 MG capsule TAKE 1 CAPSULE BY MOUTH BY MOUTH TWO TIMES DAILY 180 capsule 3  ? oxybutynin (DITROPAN-XL) 10 MG 24 hr tablet Take 1 tablet (10 mg total) by mouth at bedtime. 90 tablet 3  ? ergocalciferol (VITAMIN D2) 1.25 MG (50000 UT) capsule Take 1 capsule (50,000 Units total) by mouth once a week for 12 weeks. (Patient not taking: Reported on 01/11/2022) 12 capsule 0  ? atorvastatin (LIPITOR) 80 MG tablet TAKE 1 TABLET (80 MG TOTAL) BY MOUTH EVERY EVENING. 90 tablet 3  ? ?No facility-administered medications prior to visit.  ? ? ?No Known Allergies ? ?Review of Systems  ?Constitutional:  Negative for chills, fever and malaise/fatigue.  ?Respiratory:  Negative for cough and shortness of breath.   ?Cardiovascular:  Negative for chest pain, palpitations and leg swelling.  ?Gastrointestinal:  Negative for abdominal pain, blood in stool, constipation, diarrhea, nausea and vomiting.  ?Musculoskeletal: Negative.   ?Skin: Negative.   ?Neurological: Negative.   ?Psychiatric/Behavioral:  Negative for depression. The patient is not nervous/anxious.   ?All other systems reviewed and are negative. ? ?   ?Objective:  ?  ?Physical Exam ?Constitutional:   ?   General: He is not in acute distress. ?   Appearance: Normal appearance. He is normal weight.  ?   Comments: Patient uses walker to assist with ambulation, with notable difficulty  ?HENT:  ?   Head: Normocephalic.  ?Neck:  ?   Vascular: No carotid bruit.  ?Cardiovascular:  ?   Rate and  Rhythm: Normal rate and regular rhythm.  ?   Pulses: Normal pulses.  ?   Heart sounds: Normal heart sounds.  ?   Comments: No obvious peripheral edema ?Pulmonary:  ?   Effort: Pulmonary effort is normal.  ?   Breath sounds: Normal breath sounds.  ?Musculoskeletal:     ?   General: No swelling, tenderness, deformity or signs of injury. Normal range of motion.  ?   Cervical back: Normal range of motion and neck supple. No rigidity or tenderness.  ?   Right lower leg: No edema.  ?   Left lower leg: No edema.  ?Lymphadenopathy:  ?   Cervical: No cervical adenopathy.  ?Skin: ?   General: Skin is warm and dry.  ?   Capillary Refill: Capillary refill takes less than 2  seconds.  ?Neurological:  ?   Mental Status: He is alert and oriented to person, place, and time.  ?   Gait: Gait abnormal.  ?   Deep Tendon Reflexes: Reflexes normal.  ?Psychiatric:     ?   Mood and Affect: Mood normal.     ?   Behavior: Behavior normal.     ?   Thought Content: Thought content normal.     ?   Judgment: Judgment normal.  ? ? ?BP 113/69 (BP Location: Right Arm, Patient Position: Sitting, Cuff Size: Normal)   Pulse 63   Temp 98.4 ?F (36.9 ?C)   Ht 5\' 6"  (1.676 m)   Wt 127 lb (57.6 kg)   SpO2 100%   BMI 20.50 kg/m?  ?Wt Readings from Last 3 Encounters:  ?02/22/22 127 lb (57.6 kg)  ?10/09/21 124 lb 9.6 oz (56.5 kg)  ?07/10/21 131 lb (59.4 kg)  ? ? ?Immunization History  ?Administered Date(s) Administered  ? Influenza,inj,Quad PF,6+ Mos 09/12/2019, 10/07/2020, 07/10/2021  ? PFIZER(Purple Top)SARS-COV-2 Vaccination 01/31/2020, 02/25/2020  ? ? ?Diabetic Foot Exam - Simple   ?No data filed ?  ? ? ?Lab Results  ?Component Value Date  ? TSH 1.340 01/11/2022  ? ?Lab Results  ?Component Value Date  ? WBC 11.0 (H) 06/11/2020  ? HGB 15.3 06/11/2020  ? HCT 44.9 06/11/2020  ? MCV 93 06/11/2020  ? PLT 230 06/11/2020  ? ?Lab Results  ?Component Value Date  ? NA 140 01/11/2022  ? K 4.0 01/11/2022  ? CO2 22 06/11/2020  ? GLUCOSE 63 (L) 01/11/2022  ? BUN 5  (L) 01/11/2022  ? CREATININE 1.08 01/11/2022  ? BILITOT 0.4 01/11/2022  ? ALKPHOS 99 01/11/2022  ? AST 14 01/11/2022  ? ALT 15 06/11/2020  ? PROT 7.6 01/11/2022  ? ALBUMIN 4.2 01/11/2022  ? CALCIUM 9.2 01/11/2022  ? ANIONGAP

## 2022-02-22 NOTE — Patient Instructions (Signed)
You were seen today in the [facility] for follow up visit. You were prescribed medications, please take as directed. Please follow up in 6 mths for reevaluation.  

## 2022-02-24 NOTE — Telephone Encounter (Signed)
No additional notes needed  

## 2022-02-25 ENCOUNTER — Other Ambulatory Visit: Payer: Self-pay

## 2022-04-13 ENCOUNTER — Ambulatory Visit (INDEPENDENT_AMBULATORY_CARE_PROVIDER_SITE_OTHER): Payer: Medicare Other | Admitting: Nurse Practitioner

## 2022-04-13 VITALS — BP 111/72 | HR 75 | Temp 97.3°F | Wt 128.2 lb

## 2022-04-13 DIAGNOSIS — Z720 Tobacco use: Secondary | ICD-10-CM | POA: Diagnosis not present

## 2022-04-13 DIAGNOSIS — I6310 Cerebral infarction due to embolism of unspecified precerebral artery: Secondary | ICD-10-CM

## 2022-04-13 NOTE — Patient Instructions (Signed)
You were seen today in the [facility] for referral  . Please follow up in 6 mths for wellness

## 2022-04-13 NOTE — Progress Notes (Signed)
 [facility] [address] [place], [place]  [address] Phone:  [phone]   Fax:  [phone] Subjective:   Patient ID: Daniel Stuart, male    DOB: 11/19/1965, 56 y.o.   MRN: [id number]  Chief Complaint  Patient presents with   Follow-up    Pt is here to be evaluated for outpatient physical therapy    HPI Daniel Stuart 56 y.o. male  has a past medical history of Anxiety, Arrhythmia as indication for cardiac pacemaker replacement (03/2020), Frequent falls (03/2020), History of loop recorder (04/2017), Stroke (cerebrum) (HCC), Tobacco use, Urinary frequency (09/2019), and Vitamin D deficiency. To the [facility] for evaluation for OT.   Patient brought in by sister for referral to complete PT/OT, denies any other concerns today. Continues to smoke cigarettes, 1 PPD.   Denies any fatigue, chest pain, shortness of breath, HA or dizziness. Denies any blurred vision, numbness or tingling.   Past Medical History:  Diagnosis Date   Anxiety    Arrhythmia as indication for cardiac pacemaker replacement 03/2020   Frequent falls 03/2020   History of loop recorder 04/2017   Stroke (cerebrum) (HCC)    Tobacco use    Urinary frequency 09/2019   Vitamin D deficiency     Past Surgical History:  Procedure Laterality Date   FOOT SURGERY     LOOP RECORDER INSERTION N/A 04/13/2017   Procedure: Loop Recorder Insertion;  Surgeon: Taylor, Gregg W, MD;  Location: [facility];  Service: Cardiovascular;  Laterality: N/A;   TEE WITHOUT CARDIOVERSION N/A 04/13/2017   Procedure: TRANSESOPHAGEAL ECHOCARDIOGRAM (TEE);  Surgeon: Skains, Mark C, MD;  Location: [facility] ENDOSCOPY;  Service: Cardiovascular;  Laterality: N/A;    Family History  Problem Relation Age of Onset   Diabetes Mother    Diabetes Father    Heart failure Father    Cancer Sister     Social History   Socioeconomic History   Marital status: Legally Separated    Spouse name: Not on file   Number of  children: 1   Years of education: Not on file   Highest education level: Some college, no degree  Occupational History   Occupation: disabled  Tobacco Use   Smoking status: Every Day    Packs/day: 1.00    Types: Cigarettes    Start date: 11/01/1977   Smokeless tobacco: Never  Vaping Use   Vaping Use: Never used  Substance and Sexual Activity   Alcohol use: No   Drug use: No   Sexual activity: Not Currently    Partners: Female  Other Topics Concern   Not on file  Social History Narrative   Patient is right-handed. He lives in a one level home with his mother since CVA. He drinks one cup of coffee and 2-3 sodas a day. He walks when weather permitting.   Social Determinants of Health   Financial Resource Strain: Medium Risk (07/31/2021)   Overall Financial Resource Strain (CARDIA)    Difficulty of Paying Living Expenses: Somewhat hard  Food Insecurity: No Food Insecurity (07/31/2021)   Hunger Vital Sign    Worried About Running Out of Food in the Last Year: Never true    Ran Out of Food in the Last Year: Never true  Transportation Needs: No Transportation Needs (07/31/2021)   PRAPARE - Transportation    Lack of Transportation (Medical): No    Lack of Transportation (Non-Medical): No  Physical Activity: Inactive (07/31/2021)   Exercise Vital Sign      Days of Exercise per Week: 0 days    Minutes of Exercise per Session: 0 min  Stress: No Stress Concern Present (07/31/2021)   [place]    Feeling of Stress : Not at all  Social Connections: Socially Isolated (07/31/2021)   Social Connection and Isolation Panel [NHANES]    Frequency of Communication with Friends and Family: Three times a week    Frequency of Social Gatherings with Friends and Family: Never    Attends Religious Services: Never    Marine scientist or Organizations: No    Attends Archivist Meetings: Never    Marital Status: Separated   Intimate Partner Violence: Not At Risk (07/31/2021)   Humiliation, Afraid, Rape, and Kick questionnaire    Fear of Current or Ex-Partner: No    Emotionally Abused: No    Physically Abused: No    Sexually Abused: No    Outpatient Medications Prior to Visit  Medication Sig Dispense Refill   aspirin 81 MG EC tablet Take 1 tablet (81 mg total) by mouth daily. 30 tablet 3   atorvastatin (LIPITOR) 80 MG tablet TAKE 1 TABLET (80 MG TOTAL) BY MOUTH EVERY EVENING. 90 tablet 3   gabapentin (NEURONTIN) 300 MG capsule TAKE 1 CAPSULE BY MOUTH BY MOUTH TWO TIMES DAILY 180 capsule 3   oxybutynin (DITROPAN-XL) 10 MG 24 hr tablet Take 1 tablet (10 mg total) by mouth at bedtime. 90 tablet 3   No facility-administered medications prior to visit.    No Known Allergies  Review of Systems  Constitutional:  Negative for chills, fever and malaise/fatigue.  Respiratory:  Negative for cough and shortness of breath.   Cardiovascular:  Negative for chest pain, palpitations and leg swelling.  Gastrointestinal:  Negative for abdominal pain, blood in stool, constipation, diarrhea, nausea and vomiting.  Musculoskeletal: Negative.   Skin: Negative.   Neurological: Negative.   Psychiatric/Behavioral:  Negative for depression. The patient is not nervous/anxious.   All other systems reviewed and are negative.      Objective:    Physical Exam Vitals reviewed.  Constitutional:      General: He is not in acute distress.    Appearance: Normal appearance.  HENT:     Head: Normocephalic.  Neck:     Vascular: No carotid bruit.  Cardiovascular:     Rate and Rhythm: Normal rate and regular rhythm.     Pulses: Normal pulses.     Heart sounds: Normal heart sounds.     Comments: No obvious peripheral edema Pulmonary:     Effort: Pulmonary effort is normal.     Breath sounds: Normal breath sounds.  Musculoskeletal:        General: No swelling, tenderness, deformity or signs of injury. Normal range of motion.      Cervical back: Normal range of motion and neck supple. No rigidity or tenderness.     Right lower leg: No edema.     Left lower leg: No edema.  Lymphadenopathy:     Cervical: No cervical adenopathy.  Skin:    General: Skin is warm and dry.     Capillary Refill: Capillary refill takes less than 2 seconds.  Neurological:     General: No focal deficit present.     Mental Status: He is alert and oriented to person, place, and time.  Psychiatric:        Mood and Affect: Mood normal.  Behavior: Behavior normal.        Thought Content: Thought content normal.        Judgment: Judgment normal.     BP 111/72 (BP Location: Right Arm, Patient Position: Sitting, Cuff Size: Normal)   Pulse 75   Temp (!) 97.3 F (36.3 C)   Wt 128 lb 4 oz (58.2 kg)   BMI 20.70 kg/m  Wt Readings from Last 3 Encounters:  04/13/22 128 lb 4 oz (58.2 kg)  02/22/22 127 lb (57.6 kg)  10/09/21 124 lb 9.6 oz (56.5 kg)    Immunization History  Administered Date(s) Administered   Influenza,inj,Quad PF,6+ Mos 09/12/2019, 10/07/2020, 07/10/2021   PFIZER(Purple Top)SARS-COV-2 Vaccination 01/31/2020, 02/25/2020    Diabetic Foot Exam - Simple   No data filed     Lab Results  Component Value Date   TSH 1.340 01/11/2022   Lab Results  Component Value Date   WBC 11.0 (H) 06/11/2020   HGB 15.3 06/11/2020   HCT 44.9 06/11/2020   MCV 93 06/11/2020   PLT 230 06/11/2020   Lab Results  Component Value Date   NA 140 01/11/2022   K 4.0 01/11/2022   CO2 22 06/11/2020   GLUCOSE 63 (L) 01/11/2022   BUN 5 (L) 01/11/2022   CREATININE 1.08 01/11/2022   BILITOT 0.4 01/11/2022   ALKPHOS 99 01/11/2022   AST 14 01/11/2022   ALT 15 06/11/2020   PROT 7.6 01/11/2022   ALBUMIN 4.2 01/11/2022   CALCIUM 9.2 01/11/2022   ANIONGAP 10 04/13/2017   EGFR 80 01/11/2022   Lab Results  Component Value Date   CHOL 163 01/11/2022   CHOL 114 10/09/2021   CHOL 106 07/10/2021   Lab Results  Component Value Date    HDL 43 01/11/2022   HDL 37 (L) 10/09/2021   HDL 45 07/10/2021   Lab Results  Component Value Date   LDLCALC 103 (H) 01/11/2022   LDLCALC 60 10/09/2021   LDLCALC 47 07/10/2021   Lab Results  Component Value Date   TRIG 93 01/11/2022   TRIG 85 10/09/2021   TRIG 67 07/10/2021   Lab Results  Component Value Date   CHOLHDL 3.8 01/11/2022   CHOLHDL 3.1 10/09/2021   CHOLHDL 2.4 07/10/2021   Lab Results  Component Value Date   HGBA1C 5.3 03/11/2020   HGBA1C 5.3 05/30/2018   HGBA1C 5.4 04/12/2017       Assessment & Plan:   Problem List Items Addressed This Visit       Cardiovascular and Mediastinum   CVA (cerebral vascular accident) (HCC) - Primary   Relevant Orders   Ambulatory referral to Physical Therapy   Ambulatory referral to Occupational Therapy   DME Wheelchair electric     Other   Tobacco abuse Discussed risk factors  Discussed cessation options   Follow up in 6 mths for wellness visit, sooner as needed     I am having Daniel Stuart maintain his aspirin EC, oxybutynin, gabapentin, and atorvastatin.  No orders of the defined types were placed in this encounter.     I , NP   

## 2022-04-16 ENCOUNTER — Encounter: Payer: Self-pay | Admitting: Nurse Practitioner

## 2022-04-19 ENCOUNTER — Telehealth: Payer: Self-pay | Admitting: Nurse Practitioner

## 2022-04-19 ENCOUNTER — Other Ambulatory Visit: Payer: Self-pay | Admitting: Nurse Practitioner

## 2022-04-19 DIAGNOSIS — I6310 Cerebral infarction due to embolism of unspecified precerebral artery: Secondary | ICD-10-CM

## 2022-04-19 NOTE — Telephone Encounter (Signed)
Daniel Stuart at [facility] called me to discuss Daniel Stuart. He said that Daniel Stuart is there in the office with them right now for occupational and physical therapy. His occupational therapist and physical therapist both have agreed and suggested that Daniel Stuart would benefit from speech therapy. They are requesting speech therapy ordered be sent. A referral is not necessary, I can fax the order to them.
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
## 2022-04-22 ENCOUNTER — Ambulatory Visit (INDEPENDENT_AMBULATORY_CARE_PROVIDER_SITE_OTHER): Payer: Medicare Other | Admitting: Neurology

## 2022-04-22 ENCOUNTER — Other Ambulatory Visit: Payer: Self-pay

## 2022-04-22 VITALS — BP 120/70 | HR 71 | Ht 66.0 in | Wt 129.0 lb

## 2022-04-22 DIAGNOSIS — I6310 Cerebral infarction due to embolism of unspecified precerebral artery: Secondary | ICD-10-CM

## 2022-04-22 DIAGNOSIS — G4485 Primary stabbing headache: Secondary | ICD-10-CM

## 2022-04-22 DIAGNOSIS — I69351 Hemiplegia and hemiparesis following cerebral infarction affecting right dominant side: Secondary | ICD-10-CM | POA: Diagnosis not present

## 2022-04-22 DIAGNOSIS — H543 Unqualified visual loss, both eyes: Secondary | ICD-10-CM

## 2022-04-22 DIAGNOSIS — R2681 Unsteadiness on feet: Secondary | ICD-10-CM | POA: Diagnosis not present

## 2022-04-22 DIAGNOSIS — G20C Parkinsonism, unspecified: Secondary | ICD-10-CM

## 2022-04-22 DIAGNOSIS — R531 Weakness: Secondary | ICD-10-CM

## 2022-04-22 DIAGNOSIS — Z8673 Personal history of transient ischemic attack (TIA), and cerebral infarction without residual deficits: Secondary | ICD-10-CM

## 2022-04-22 DIAGNOSIS — G20 Parkinson's disease: Secondary | ICD-10-CM | POA: Diagnosis not present

## 2022-04-22 MED ORDER — GABAPENTIN 300 MG PO CAPS
ORAL_CAPSULE | ORAL | 3 refills | Status: AC
  Filled 2022-04-22: qty 180, 90d supply, fill #0
  Filled 2022-08-02: qty 180, 90d supply, fill #1

## 2022-04-22 NOTE — Patient Instructions (Signed)
Check MRI of brain without contrast Refer to ophthalmology Continue physical therapy and occupational therapy Refilled gabapentin Follow up 4 months.

## 2022-04-23 ENCOUNTER — Encounter: Payer: Self-pay | Admitting: Neurology

## 2022-04-27 ENCOUNTER — Telehealth: Payer: Self-pay | Admitting: Neurology

## 2022-04-27 ENCOUNTER — Other Ambulatory Visit: Payer: Self-pay

## 2022-04-27 DIAGNOSIS — H543 Unqualified visual loss, both eyes: Secondary | ICD-10-CM

## 2022-04-28 NOTE — Telephone Encounter (Signed)
Patient advised new Referral added for [facility].  Faxed Via [employer].

## 2022-05-03 DIAGNOSIS — E785 Hyperlipidemia, unspecified: Secondary | ICD-10-CM | POA: Insufficient documentation

## 2022-05-10 ENCOUNTER — Ambulatory Visit
Admission: RE | Admit: 2022-05-10 | Discharge: 2022-05-10 | Disposition: A | Discharge status: Home / Self Care | Payer: Medicare Other | Source: Ambulatory Visit | Attending: Neurology | Admitting: Neurology

## 2022-05-10 DIAGNOSIS — R531 Weakness: Secondary | ICD-10-CM

## 2022-05-10 DIAGNOSIS — Z8673 Personal history of transient ischemic attack (TIA), and cerebral infarction without residual deficits: Secondary | ICD-10-CM

## 2022-05-10 DIAGNOSIS — R2681 Unsteadiness on feet: Secondary | ICD-10-CM

## 2022-05-12 ENCOUNTER — Telehealth: Payer: Self-pay

## 2022-05-12 DIAGNOSIS — R251 Tremor, unspecified: Secondary | ICD-10-CM

## 2022-05-12 NOTE — Telephone Encounter (Signed)
Patient sister advised of MRI results order for DaTScan ordered.   Datscan forms filled out and faxed off.

## 2022-05-12 NOTE — Telephone Encounter (Signed)
-----   Message from Drema Dallas, DO sent at 05/12/2022  9:54 AM EDT ----- MRI of brain overall looks unchanged compared to 5 years ago.  At this time, I would recommend the DaT scan to evaluate for possibility of Parkinson's disease

## 2022-06-04 ENCOUNTER — Encounter (HOSPITAL_COMMUNITY)
Admission: RE | Admit: 2022-06-04 | Discharge: 2022-06-04 | Disposition: A | Discharge status: Home / Self Care | Payer: Medicare Other | Source: Ambulatory Visit | Attending: Neurology | Admitting: Neurology

## 2022-06-04 DIAGNOSIS — R251 Tremor, unspecified: Secondary | ICD-10-CM | POA: Insufficient documentation

## 2022-06-04 MED ORDER — POTASSIUM IODIDE (ANTIDOTE) 130 MG PO TABS
ORAL_TABLET | ORAL | Status: AC
  Filled 2022-06-04: qty 1

## 2022-06-04 MED ORDER — IOFLUPANE I 123 185 MBQ/2.5ML IV SOLN
4.6000 | Freq: Once | INTRAVENOUS | Status: AC | PRN
  Administered 2022-06-04: 4.6 via INTRAVENOUS
  Filled 2022-06-04: qty 5

## 2022-06-04 MED ORDER — POTASSIUM IODIDE (ANTIDOTE) 130 MG PO TABS: 130.0000 mg | ORAL_TABLET | Freq: Once | ORAL | Status: DC

## 2022-08-02 ENCOUNTER — Other Ambulatory Visit: Payer: Self-pay

## 2022-08-03 ENCOUNTER — Other Ambulatory Visit: Payer: Self-pay

## 2022-08-04 ENCOUNTER — Other Ambulatory Visit: Payer: Self-pay

## 2022-08-06 ENCOUNTER — Other Ambulatory Visit: Payer: Self-pay

## 2022-08-16 ENCOUNTER — Telehealth: Payer: Self-pay | Admitting: Clinical

## 2022-08-16 NOTE — Telephone Encounter (Signed)
[facility] General Follow Up Note  08/16/2022 Name: JOSHWA HEMRIC MRN: [id number] DOB: 1966/03/29 TOLLIE CANADA is a 56 y.o. year old male who sees [place], Jake Church I, NP for primary care. LCSW was consulted to assist the patient with community resources.  Interpreter: No.   Interpreter Name & Language: none  Assessment: Patient experiencing mobility barriers and challenges with managing ADLs independently.   Ongoing Intervention: Patient's sister, Butch Penny, called CSW and requested assistance. They need documentation from patient's PCP about the need for an electric wheelchair. Patient is scheduled with new PCP, Creta Levin on 10/25. Butch Penny also requested help with a personal care aide for patient. CSW to refer to [facility] and will discuss with referral coordinator and PCP for other options as well.    Review of patient status, including review of consultants reports, relevant laboratory and other test results, and collaboration with appropriate care team members and the patient's provider was performed as part of comprehensive patient evaluation and provision of services.    Estanislado Emms, [facility] [phone]

## 2022-08-25 ENCOUNTER — Ambulatory Visit: Payer: Medicare Other | Admitting: Nurse Practitioner

## 2022-08-25 ENCOUNTER — Encounter: Payer: Self-pay | Admitting: Nurse Practitioner

## 2022-08-25 ENCOUNTER — Ambulatory Visit (INDEPENDENT_AMBULATORY_CARE_PROVIDER_SITE_OTHER): Payer: Medicare Other | Admitting: Nurse Practitioner

## 2022-08-25 VITALS — BP 111/74 | HR 75 | Temp 98.2°F | Ht 67.0 in | Wt 123.0 lb

## 2022-08-25 DIAGNOSIS — E559 Vitamin D deficiency, unspecified: Secondary | ICD-10-CM | POA: Diagnosis not present

## 2022-08-25 DIAGNOSIS — I739 Peripheral vascular disease, unspecified: Secondary | ICD-10-CM | POA: Insufficient documentation

## 2022-08-25 DIAGNOSIS — Z7401 Bed confinement status: Secondary | ICD-10-CM | POA: Insufficient documentation

## 2022-08-25 DIAGNOSIS — Z7409 Other reduced mobility: Secondary | ICD-10-CM

## 2022-08-25 DIAGNOSIS — Z23 Encounter for immunization: Secondary | ICD-10-CM | POA: Diagnosis not present

## 2022-08-25 DIAGNOSIS — R296 Repeated falls: Secondary | ICD-10-CM

## 2022-08-25 DIAGNOSIS — E782 Mixed hyperlipidemia: Secondary | ICD-10-CM

## 2022-08-25 DIAGNOSIS — Z716 Tobacco abuse counseling: Secondary | ICD-10-CM | POA: Diagnosis not present

## 2022-08-25 DIAGNOSIS — Z8673 Personal history of transient ischemic attack (TIA), and cerebral infarction without residual deficits: Secondary | ICD-10-CM

## 2022-08-25 NOTE — Assessment & Plan Note (Signed)
Patient educated on CDC recommendation for the influenza vaccine. Verbal consent was obtained from the patient, vaccine administered by nurse, no sign of adverse reactions noted at this time. Patient education on arm soreness and use of tylenol or  for this patient  was discussed. Patient educated on the signs and symptoms of adverse effect and advise to contact the office if they occur.  

## 2022-08-25 NOTE — Assessment & Plan Note (Signed)
This SmartLink has not been configured with any valid records.   Last vitamin D Lab Results  Component Value Date   VD25OH 14.6 (L) 10/09/2021  Check vitamin D levels

## 2022-08-25 NOTE — Assessment & Plan Note (Signed)
No complaints today Continue atorvastatin 80 mg daily aspirin 81 mg daily,  Need for smoking cessation  discussed with patient

## 2022-08-25 NOTE — Assessment & Plan Note (Addendum)
Has impaired mobility due to past stroke Patient was evaluated by physical therapy I agree with mobility assessment done with physical therapy Order placed  today for a power chair Currently taking aspirin 81 mg daily, atorvastatin 80 mg daily Patient encouraged to maintain close follow-up with neurology he verbalized understanding

## 2022-08-25 NOTE — Assessment & Plan Note (Signed)
Smokes about 1 pack/day  Asked about quitting: confirms that he/she currently smokes cigarettes Advise to quit smoking: Educated about QUITTING to reduce the risk of cancer, cardio and cerebrovascular disease. Assess willingness: Unwilling to quit at this time, but is working on cutting back. Assist with counseling and pharmacotherapy: Counseled for 5 minutes and literature provided. Arrange for follow up: follow up in 3-6 months and continue to offer help. 

## 2022-08-25 NOTE — Progress Notes (Deleted)
Established Patient Office Visit  Subjective:  Patient ID: Daniel Stuart, male    DOB: Oct 19, 1966  Age: 56 y.o. MRN: [id number]  CC: No chief complaint on file.   HPI Daniel Stuart is a 56 y.o. male with past medical history of *** presents for f/u of *** chronic medical conditions.  Past Medical History:  Diagnosis Date   Anxiety    Arrhythmia as indication for cardiac pacemaker replacement 03/2020   Frequent falls 03/2020   History of loop recorder 04/2017   Stroke (cerebrum) (HCC)    Tobacco use    Urinary frequency 09/2019   Vitamin D deficiency     Past Surgical History:  Procedure Laterality Date   FOOT SURGERY     LOOP RECORDER INSERTION N/A 04/13/2017   Procedure: Loop Recorder Insertion;  Surgeon: Evans Lance, MD;  Location: [facility];  Service: Cardiovascular;  Laterality: N/A;   TEE WITHOUT CARDIOVERSION N/A 04/13/2017   Procedure: TRANSESOPHAGEAL ECHOCARDIOGRAM (TEE);  Surgeon: Jerline Pain, MD;  Location: [facility] ENDOSCOPY;  Service: Cardiovascular;  Laterality: N/A;    Family History  Problem Relation Age of Onset   Diabetes Mother    Diabetes Father    Heart failure Father    Cancer Sister     Social History   Socioeconomic History   Marital status: Legally Separated    Spouse name: Not on file   Number of children: 1   Years of education: Not on file   Highest education level: Some college, no degree  Occupational History   Occupation: disabled  Tobacco Use   Smoking status: Every Day    Packs/day: 1.00    Types: Cigarettes    Start date: 11/01/1977   Smokeless tobacco: Never  Vaping Use   Vaping Use: Never used  Substance and Sexual Activity   Alcohol use: No   Drug use: No   Sexual activity: Not Currently    Partners: Female  Other Topics Concern   Not on file  Social History Narrative   Patient is right-handed. He lives in a one level home with his mother since CVA. He drinks one cup of coffee and 2-3 sodas a day. He  walks when weather permitting.   Social Determinants of Health   Financial Resource Strain: Medium Risk (07/31/2021)   Overall Financial Resource Strain (CARDIA)    Difficulty of Paying Living Expenses: Somewhat hard  Food Insecurity: No Food Insecurity (07/31/2021)   Hunger Vital Sign    Worried About Running Out of Food in the Last Year: Never true    Ran Out of Food in the Last Year: Never true  Transportation Needs: No Transportation Needs (07/31/2021)   PRAPARE - Hydrologist (Medical): No    Lack of Transportation (Non-Medical): No  Physical Activity: Inactive (07/31/2021)   Exercise Vital Sign    Days of Exercise per Week: 0 days    Minutes of Exercise per Session: 0 min  Stress: No Stress Concern Present (07/31/2021)   [name]    Feeling of Stress : Not at all  Social Connections: Socially Isolated (07/31/2021)   Social Connection and Isolation Panel [NHANES]    Frequency of Communication with Friends and Family: Three times a week    Frequency of Social Gatherings with Friends and Family: Never    Attends Religious Services: Never    Marine scientist or Organizations: No  Attends Archivist Meetings: Never    Marital Status: Separated  Intimate Partner Violence: Not At Risk (07/31/2021)   Humiliation, Afraid, Rape, and Kick questionnaire    Fear of Current or Ex-Partner: No    Emotionally Abused: No    Physically Abused: No    Sexually Abused: No    Outpatient Medications Prior to Visit  Medication Sig Dispense Refill   aspirin 81 MG EC tablet Take 1 tablet (81 mg total) by mouth daily. 30 tablet 3   atorvastatin (LIPITOR) 80 MG tablet TAKE 1 TABLET (80 MG TOTAL) BY MOUTH EVERY EVENING. 90 tablet 3   gabapentin (NEURONTIN) 300 MG capsule TAKE 1 CAPSULE BY MOUTH BY MOUTH TWO TIMES DAILY 180 capsule 3   oxybutynin (DITROPAN-XL) 10 MG 24 hr tablet Take 1 tablet  (10 mg total) by mouth at bedtime. 90 tablet 3   No facility-administered medications prior to visit.    No Known Allergies  ROS Review of Systems    Objective:    Physical Exam  There were no vitals taken for this visit. Wt Readings from Last 3 Encounters:  04/22/22 129 lb (58.5 kg)  04/13/22 128 lb 4 oz (58.2 kg)  02/22/22 127 lb (57.6 kg)    Lab Results  Component Value Date   TSH 1.340 01/11/2022   Lab Results  Component Value Date   WBC 11.0 (H) 06/11/2020   HGB 15.3 06/11/2020   HCT 44.9 06/11/2020   MCV 93 06/11/2020   PLT 230 06/11/2020   Lab Results  Component Value Date   NA 140 01/11/2022   K 4.0 01/11/2022   CO2 22 06/11/2020   GLUCOSE 63 (L) 01/11/2022   BUN 5 (L) 01/11/2022   CREATININE 1.08 01/11/2022   BILITOT 0.4 01/11/2022   ALKPHOS 99 01/11/2022   AST 14 01/11/2022   ALT 15 06/11/2020   PROT 7.6 01/11/2022   ALBUMIN 4.2 01/11/2022   CALCIUM 9.2 01/11/2022   ANIONGAP 10 04/13/2017   EGFR 80 01/11/2022   Lab Results  Component Value Date   CHOL 163 01/11/2022   Lab Results  Component Value Date   HDL 43 01/11/2022   Lab Results  Component Value Date   LDLCALC 103 (H) 01/11/2022   Lab Results  Component Value Date   TRIG 93 01/11/2022   Lab Results  Component Value Date   CHOLHDL 3.8 01/11/2022   Lab Results  Component Value Date   HGBA1C 5.3 03/11/2020      Assessment & Plan:   Problem List Items Addressed This Visit   None   No orders of the defined types were placed in this encounter.   Follow-up: No follow-ups on file.    [name], [name]

## 2022-08-25 NOTE — Assessment & Plan Note (Signed)
Ambulatory referral for home health aide placed today Needs assiatnance with ADLs Power wheelchair ordered today to assist with mobility I agree with mobility assessment and PT evaluation for a power chair

## 2022-08-25 NOTE — Patient Instructions (Addendum)
  Please get your shingles vaccine at the pharmacy  Fasting labs as dicussed    Think about what you will eat, plan ahead. Choose " clean, green, fresh or frozen" over canned, processed or packaged foods which are more sugary, salty and fatty. 70 to 75% of food eaten should be vegetables and fruit. Three meals at set times with snacks allowed between meals, but they must be fruit or vegetables. Aim to eat over a 12 hour period , example 7 am to 7 pm, and STOP after  your last meal of the day. Drink water,generally about 64 ounces per day, no other drink is as healthy. Fruit juice is best enjoyed in a healthy way, by EATING the fruit.  Thanks for choosing [facility] we consider it a privelige to serve you.

## 2022-08-25 NOTE — Progress Notes (Signed)
Established Patient Office Visit  Subjective:  Patient ID: Daniel Stuart, male    DOB: [birth year]  Age: 56 y.o. MRN: [id number]  CC:  Chief Complaint  Patient presents with   Follow-up    Face to face for wheel chair    HPI Daniel Stuart is a 56 y.o. male with past medical history of CVA, hyperlipidemia, PVD, tobacco abuse, vitamin D deficiency, frequent falls, urinary frequency who presents for follow-up for his chronic medical condition and mobility assessment.  I agree with mobility assessment and PT evaluation for a power chair.   Patient is currently living at home by himself, requesting for home health aide for  help with ADLs, has had frequent falls, fell twice in the past 3 weeks.  He is currently using a rolling walker, cannot walk without a walker, can stand up only for just a few minutes.  Last stroke was in 2018 he is being followed by neurologist.  Currently on atorvastatin 80 mg daily.    Started smoking at age 56, smokes 1 pack of cigarettes daily.  Patient denies shortness of breath, wheezing has quit smoking in the past.  Flu vaccine given in the office today patient encouraged to get shingles vaccine at the pharmacy.  Cologuard ordered for colon cancer screening.    Past Medical History:  Diagnosis Date   Anxiety    Arrhythmia as indication for cardiac pacemaker replacement 03/2020   Frequent falls 03/2020   History of loop recorder 04/2017   Stroke (cerebrum) (HCC)    Tobacco use    Urinary frequency 09/2019   Vitamin D deficiency     Past Surgical History:  Procedure Laterality Date   FOOT SURGERY     LOOP RECORDER INSERTION N/A 04/13/2017   Procedure: Loop Recorder Insertion;  Surgeon: Evans Lance, MD;  Location: [facility];  Service: Cardiovascular;  Laterality: N/A;   TEE WITHOUT CARDIOVERSION N/A 04/13/2017   Procedure: TRANSESOPHAGEAL ECHOCARDIOGRAM (TEE);  Surgeon: Jerline Pain, MD;  Location: [facility] ENDOSCOPY;  Service:  Cardiovascular;  Laterality: N/A;    Family History  Problem Relation Age of Onset   Diabetes Mother    COPD Mother    Diabetes Father    Heart failure Father    Cancer Sister     Social History   Socioeconomic History   Marital status: Legally Separated    Spouse name: Not on file   Number of children: 1   Years of education: Not on file   Highest education level: Some college, no degree  Occupational History   Occupation: disabled  Tobacco Use   Smoking status: Every Day    Packs/day: 1.00    Types: Cigarettes    Start date: 11/01/1977   Smokeless tobacco: Never  Vaping Use   Vaping Use: Never used  Substance and Sexual Activity   Alcohol use: No   Drug use: No   Sexual activity: Not Currently    Partners: Female  Other Topics Concern   Not on file  Social History Narrative   Patient is right-handed. He lives in a one level home alone  since. He drinks one cup of coffee and 2-3 sodas a day.   Social Determinants of Health   Financial Resource Strain: Medium Risk (07/31/2021)   Overall Financial Resource Strain (CARDIA)    Difficulty of Paying Living Expenses: Somewhat hard  Food Insecurity: No Food Insecurity (07/31/2021)   Hunger Vital Sign    Worried About Running  Out of Food in the Last Year: Never true    Ran Out of Food in the Last Year: Never true  Transportation Needs: No Transportation Needs (07/31/2021)   PRAPARE - Hydrologist (Medical): No    Lack of Transportation (Non-Medical): No  Physical Activity: Inactive (07/31/2021)   Exercise Vital Sign    Days of Exercise per Week: 0 days    Minutes of Exercise per Session: 0 min  Stress: No Stress Concern Present (07/31/2021)   [place]    Feeling of Stress : Not at all  Social Connections: Socially Isolated (07/31/2021)   Social Connection and Isolation Panel [NHANES]    Frequency of Communication with Friends  and Family: Three times a week    Frequency of Social Gatherings with Friends and Family: Never    Attends Religious Services: Never    Marine scientist or Organizations: No    Attends Archivist Meetings: Never    Marital Status: Separated  Intimate Partner Violence: Not At Risk (07/31/2021)   Humiliation, Afraid, Rape, and Kick questionnaire    Fear of Current or Ex-Partner: No    Emotionally Abused: No    Physically Abused: No    Sexually Abused: No    Outpatient Medications Prior to Visit  Medication Sig Dispense Refill   aspirin 81 MG EC tablet Take 1 tablet (81 mg total) by mouth daily. 30 tablet 3   atorvastatin (LIPITOR) 80 MG tablet TAKE 1 TABLET (80 MG TOTAL) BY MOUTH EVERY EVENING. 90 tablet 3   gabapentin (NEURONTIN) 300 MG capsule TAKE 1 CAPSULE BY MOUTH BY MOUTH TWO TIMES DAILY 180 capsule 3   oxybutynin (DITROPAN-XL) 10 MG 24 hr tablet Take 1 tablet (10 mg total) by mouth at bedtime. 90 tablet 3   No facility-administered medications prior to visit.    No Known Allergies  ROS Review of Systems  Constitutional:  Negative for activity change, appetite change, chills, diaphoresis and fatigue.  Respiratory:  Negative for apnea, choking, chest tightness, shortness of breath, wheezing and stridor.   Cardiovascular: Negative.  Negative for chest pain and leg swelling.  Gastrointestinal: Negative.  Negative for abdominal distention and abdominal pain.  Endocrine: Negative.   Genitourinary:  Negative for difficulty urinating, flank pain, genital sores and penile pain.  Musculoskeletal:  Positive for gait problem. Negative for neck pain and neck stiffness.  Skin: Negative.   Neurological:  Positive for weakness. Negative for dizziness, facial asymmetry, speech difficulty, light-headedness and headaches.  Psychiatric/Behavioral: Negative.  Negative for agitation, behavioral problems, confusion and decreased concentration.       Objective:    Physical  Exam Constitutional:      General: He is not in acute distress.    Appearance: He is not ill-appearing, toxic-appearing or diaphoretic.  Eyes:     General: No scleral icterus.       Right eye: No discharge.        Left eye: No discharge.     Extraocular Movements: Extraocular movements intact.     Conjunctiva/sclera: Conjunctivae normal.  Cardiovascular:     Rate and Rhythm: Normal rate and regular rhythm.     Pulses: Normal pulses.     Heart sounds: Normal heart sounds. No murmur heard.    No friction rub. No gallop.  Pulmonary:     Effort: Pulmonary effort is normal. No respiratory distress.     Breath sounds: Normal breath  sounds. No stridor. No wheezing, rhonchi or rales.  Chest:     Chest wall: No tenderness.  Abdominal:     General: There is no distension.     Palpations: Abdomen is soft.     Tenderness: There is no abdominal tenderness. There is no guarding.  Musculoskeletal:     Right lower leg: No edema.     Left lower leg: No edema.     Comments: Patient sitting on a rollator,  power  5/5 bilateral upper extremities, 4/5 Bilateral lower extremities.   Skin:    Capillary Refill: Capillary refill takes less than 2 seconds.     Coloration: Skin is not jaundiced.     Findings: No bruising, erythema or rash.  Neurological:     Mental Status: He is alert and oriented to person, place, and time.     Motor: Weakness present.     Gait: Gait abnormal.  Psychiatric:        Mood and Affect: Mood normal.        Behavior: Behavior normal.        Thought Content: Thought content normal.        Judgment: Judgment normal.     BP 111/74   Pulse 75   Temp 98.2 F (36.8 C)   Ht 5' 7"  (1.702 m)   Wt 123 lb (55.8 kg)   SpO2 97%   BMI 19.26 kg/m  Wt Readings from Last 3 Encounters:  08/25/22 123 lb (55.8 kg)  04/22/22 129 lb (58.5 kg)  04/13/22 128 lb 4 oz (58.2 kg)    Lab Results  Component Value Date   TSH 1.340 01/11/2022   Lab Results  Component Value Date    WBC 11.0 (H) 06/11/2020   HGB 15.3 06/11/2020   HCT 44.9 06/11/2020   MCV 93 06/11/2020   PLT 230 06/11/2020   Lab Results  Component Value Date   NA 140 01/11/2022   K 4.0 01/11/2022   CO2 22 06/11/2020   GLUCOSE 63 (L) 01/11/2022   BUN 5 (L) 01/11/2022   CREATININE 1.08 01/11/2022   BILITOT 0.4 01/11/2022   ALKPHOS 99 01/11/2022   AST 14 01/11/2022   ALT 15 06/11/2020   PROT 7.6 01/11/2022   ALBUMIN 4.2 01/11/2022   CALCIUM 9.2 01/11/2022   ANIONGAP 10 04/13/2017   EGFR 80 01/11/2022   Lab Results  Component Value Date   CHOL 163 01/11/2022   Lab Results  Component Value Date   HDL 43 01/11/2022   Lab Results  Component Value Date   LDLCALC 103 (H) 01/11/2022   Lab Results  Component Value Date   TRIG 93 01/11/2022   Lab Results  Component Value Date   CHOLHDL 3.8 01/11/2022   Lab Results  Component Value Date   HGBA1C 5.3 03/11/2020      Assessment & Plan:   Problem List Items Addressed This Visit       Cardiovascular and Mediastinum   PAD (peripheral artery disease) (HCC)    No complaints today Continue atorvastatin 80 mg daily aspirin 81 mg daily,  Need for smoking cessation  discussed with patient        Other   Tobacco abuse    Smokes about 1 pack/day  Asked about quitting: confirms that he/she currently smokes cigarettes Advise to quit smoking: Educated about QUITTING to reduce the risk of cancer, cardio and cerebrovascular disease. Assess willingness: Unwilling to quit at this time, but is working on cutting back.  Assist with counseling and pharmacotherapy: Counseled for 5 minutes and literature provided. Arrange for follow up: follow up in 3-6 months and continue to offer help.      Hyperlipidemia    Currently on atorvastatin 80 mg daily Patient will come back for fasting lipid panel Avoid fatty fried foods      Relevant Orders   Lipid Panel   S/P stroke due to cerebrovascular disease    Has impaired mobility due to past  stroke Patient was evaluated by physical therapy I agree with mobility assessment done with physical therapy Order placed  today for a power chair Currently taking aspirin 81 mg daily, atorvastatin 80 mg daily Patient encouraged to maintain close follow-up with neurology he verbalized understanding      Relevant Orders   DME Wheelchair electric   Ambulatory referral to [place] D deficiency - Primary    This SmartLink has not been configured with any valid records.  Last vitamin D Lab Results  Component Value Date   VD25OH 14.6 (L) 10/09/2021  Check vitamin D levels       Relevant Orders   Vitamin D, 25-hydroxy   Frequent falls    [address] while trying to get in the bathroom recently Need to prevent falls discussed with the patient Patient encouraged to maintain adequate lighting at home, use his walker for balance, keep home free of clutter's.  He verbalized understanding      Impaired mobility    Ambulatory referral for home health aide placed today Needs assiatnance with ADLs Power wheelchair ordered today to assist with mobility I agree with mobility assessment and PT evaluation for a power chair      Need for influenza vaccination    Patient educated on CDC recommendation for the influenza  vaccine. Verbal consent was obtained from the patient, vaccine administered by nurse, no sign of adverse reactions noted at this time. Patient education on arm soreness and use of tylenol or for this patient  was discussed. Patient educated on the signs and symptoms of adverse effect and advise to contact the office if they occur.       Relevant Orders   Flu Vaccine QUAD 2moIM (Fluarix, Fluzone & Alfiuria Quad PF)    No orders of the defined types were placed in this encounter.   Follow-up: Return in about 6 months (around 02/24/2023) for hyperlipidemia.    FRenee Rival [name]

## 2022-08-25 NOTE — Assessment & Plan Note (Signed)
[address] while trying to get in the bathroom recently Need to prevent falls discussed with the patient Patient encouraged to maintain adequate lighting at home, use his walker for balance, keep home free of clutter's.  He verbalized understanding

## 2022-08-25 NOTE — Assessment & Plan Note (Signed)
Currently on atorvastatin 80 mg daily Patient will come back for fasting lipid panel Avoid fatty fried foods

## 2022-09-03 ENCOUNTER — Other Ambulatory Visit: Payer: Self-pay

## 2022-09-06 ENCOUNTER — Other Ambulatory Visit: Payer: Medicare Other

## 2022-09-06 DIAGNOSIS — E782 Mixed hyperlipidemia: Secondary | ICD-10-CM

## 2022-09-06 DIAGNOSIS — E559 Vitamin D deficiency, unspecified: Secondary | ICD-10-CM

## 2022-09-07 ENCOUNTER — Inpatient Hospital Stay (HOSPITAL_COMMUNITY)
Admission: EM | Admit: 2022-09-07 | Discharge: 2022-09-10 | DRG: 065 | Disposition: A | Discharge status: Skilled Nursing Facility | Payer: Medicare Other | Attending: Internal Medicine | Admitting: Internal Medicine

## 2022-09-07 DIAGNOSIS — I69351 Hemiplegia and hemiparesis following cerebral infarction affecting right dominant side: Secondary | ICD-10-CM

## 2022-09-07 DIAGNOSIS — G20C Parkinsonism, unspecified: Secondary | ICD-10-CM | POA: Diagnosis present

## 2022-09-07 DIAGNOSIS — Z825 Family history of asthma and other chronic lower respiratory diseases: Secondary | ICD-10-CM

## 2022-09-07 DIAGNOSIS — Z7401 Bed confinement status: Secondary | ICD-10-CM | POA: Diagnosis present

## 2022-09-07 DIAGNOSIS — R29810 Facial weakness: Secondary | ICD-10-CM | POA: Diagnosis present

## 2022-09-07 DIAGNOSIS — Z833 Family history of diabetes mellitus: Secondary | ICD-10-CM

## 2022-09-07 DIAGNOSIS — Z8673 Personal history of transient ischemic attack (TIA), and cerebral infarction without residual deficits: Secondary | ICD-10-CM

## 2022-09-07 DIAGNOSIS — I639 Cerebral infarction, unspecified: Secondary | ICD-10-CM | POA: Diagnosis present

## 2022-09-07 DIAGNOSIS — Z7409 Other reduced mobility: Secondary | ICD-10-CM | POA: Diagnosis present

## 2022-09-07 DIAGNOSIS — W19XXXA Unspecified fall, initial encounter: Secondary | ICD-10-CM

## 2022-09-07 DIAGNOSIS — R296 Repeated falls: Secondary | ICD-10-CM

## 2022-09-07 DIAGNOSIS — R29898 Other symptoms and signs involving the musculoskeletal system: Principal | ICD-10-CM | POA: Diagnosis present

## 2022-09-07 DIAGNOSIS — D649 Anemia, unspecified: Secondary | ICD-10-CM | POA: Diagnosis present

## 2022-09-07 DIAGNOSIS — Z8249 Family history of ischemic heart disease and other diseases of the circulatory system: Secondary | ICD-10-CM

## 2022-09-07 DIAGNOSIS — Z79899 Other long term (current) drug therapy: Secondary | ICD-10-CM

## 2022-09-07 DIAGNOSIS — I700 Atherosclerosis of aorta: Secondary | ICD-10-CM | POA: Diagnosis present

## 2022-09-07 DIAGNOSIS — I6340 Cerebral infarction due to embolism of unspecified cerebral artery: Principal | ICD-10-CM | POA: Diagnosis present

## 2022-09-07 DIAGNOSIS — I6522 Occlusion and stenosis of left carotid artery: Secondary | ICD-10-CM | POA: Diagnosis present

## 2022-09-07 DIAGNOSIS — E785 Hyperlipidemia, unspecified: Secondary | ICD-10-CM | POA: Diagnosis present

## 2022-09-07 DIAGNOSIS — G9389 Other specified disorders of brain: Secondary | ICD-10-CM | POA: Diagnosis present

## 2022-09-07 DIAGNOSIS — E559 Vitamin D deficiency, unspecified: Secondary | ICD-10-CM | POA: Diagnosis present

## 2022-09-07 DIAGNOSIS — Z720 Tobacco use: Secondary | ICD-10-CM | POA: Diagnosis present

## 2022-09-07 DIAGNOSIS — N179 Acute kidney failure, unspecified: Secondary | ICD-10-CM | POA: Diagnosis present

## 2022-09-07 DIAGNOSIS — Z7982 Long term (current) use of aspirin: Secondary | ICD-10-CM

## 2022-09-07 DIAGNOSIS — R29702 NIHSS score 2: Secondary | ICD-10-CM | POA: Diagnosis present

## 2022-09-07 DIAGNOSIS — D72829 Elevated white blood cell count, unspecified: Secondary | ICD-10-CM | POA: Diagnosis present

## 2022-09-07 DIAGNOSIS — H543 Unqualified visual loss, both eyes: Secondary | ICD-10-CM | POA: Diagnosis present

## 2022-09-07 DIAGNOSIS — I739 Peripheral vascular disease, unspecified: Secondary | ICD-10-CM | POA: Diagnosis present

## 2022-09-07 DIAGNOSIS — R29710 NIHSS score 10: Secondary | ICD-10-CM | POA: Diagnosis not present

## 2022-09-07 DIAGNOSIS — E538 Deficiency of other specified B group vitamins: Secondary | ICD-10-CM | POA: Diagnosis present

## 2022-09-07 DIAGNOSIS — F17210 Nicotine dependence, cigarettes, uncomplicated: Secondary | ICD-10-CM | POA: Diagnosis present

## 2022-09-07 LAB — COMPREHENSIVE METABOLIC PANEL
ALT: 17 U/L (ref 0–44)
AST: 24 U/L (ref 15–41)
Albumin: 3.2 g/dL — ABNORMAL LOW (ref 3.5–5.0)
Alkaline Phosphatase: 74 U/L (ref 38–126)
Anion gap: 13 (ref 5–15)
BUN: 6 mg/dL (ref 6–20)
CO2: 25 mmol/L (ref 22–32)
Calcium: 8.8 mg/dL — ABNORMAL LOW (ref 8.9–10.3)
Chloride: 104 mmol/L (ref 98–111)
Creatinine, Ser: 1.29 mg/dL — ABNORMAL HIGH (ref 0.61–1.24)
GFR, Estimated: >60 mL/min (ref >60)
Glucose, Bld: 92 mg/dL (ref 70–99)
Potassium: 3.5 mmol/L (ref 3.5–5.1)
Sodium: 142 mmol/L (ref 135–145)
Total Bilirubin: 1.2 mg/dL (ref 0.3–1.2)
Total Protein: 6.3 g/dL — ABNORMAL LOW (ref 6.5–8.1)

## 2022-09-07 LAB — CBC
HCT: 34.2 % — ABNORMAL LOW (ref 39.0–52.0)
Hemoglobin: 12.4 g/dL — ABNORMAL LOW (ref 13.0–17.0)
MCH: 33.9 pg (ref 26.0–34.0)
MCHC: 36.3 g/dL — ABNORMAL HIGH (ref 30.0–36.0)
MCV: 93.4 fL (ref 80.0–100.0)
Platelets: 172 10*3/uL (ref 150–400)
RBC: 3.66 MIL/uL — ABNORMAL LOW (ref 4.22–5.81)
RDW: 15.2 % (ref 11.5–15.5)
WBC: 16.1 10*3/uL — ABNORMAL HIGH (ref 4.0–10.5)
nRBC: 0 % (ref 0.0–0.2)

## 2022-09-07 LAB — VITAMIN D 25 HYDROXY (VIT D DEFICIENCY, FRACTURES): Vit D, 25-Hydroxy: 26.6 ng/mL — ABNORMAL LOW (ref 30.0–100.0)

## 2022-09-07 LAB — DIFFERENTIAL
Abs Immature Granulocytes: 0.04 10*3/uL (ref 0.00–0.07)
Basophils Absolute: 0 10*3/uL (ref 0.0–0.1)
Basophils Relative: 0 %
Eosinophils Absolute: 0 10*3/uL (ref 0.0–0.5)
Eosinophils Relative: 0 %
Immature Granulocytes: 0 %
Lymphocytes Relative: 9 %
Lymphs Abs: 1.5 10*3/uL (ref 0.7–4.0)
Monocytes Absolute: 0.9 10*3/uL (ref 0.1–1.0)
Monocytes Relative: 6 %
Neutro Abs: 13.6 10*3/uL — ABNORMAL HIGH (ref 1.7–7.7)
Neutrophils Relative %: 85 %

## 2022-09-07 LAB — ETHANOL: Alcohol, Ethyl (B): <10 mg/dL (ref <10)

## 2022-09-07 LAB — LIPID PANEL
Chol/HDL Ratio: 2.4 ratio (ref 0.0–5.0)
Cholesterol, Total: 93 mg/dL — ABNORMAL LOW (ref 100–199)
HDL: 38 mg/dL — ABNORMAL LOW (ref >39)
LDL Chol Calc (NIH): 41 mg/dL (ref 0–99)
Triglycerides: 62 mg/dL (ref 0–149)
VLDL Cholesterol Cal: 14 mg/dL (ref 5–40)

## 2022-09-07 NOTE — H&P (Incomplete)
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  Poststroke he been given diagnosis of gait instability, parkinsonism with frequent falls.  He has chronic right-sided weakness.  He has chronic bilateral lower extremity weakness, uses a cane at baseline.  Patient lives alone, does all his ADLs.  Today patient had 2 falls.  He endorses worsened bilateral lower extremity weakness.  He denies any other neurological symptoms, no upper extremity weakness, lightheadedness, no headaches, no tingling or numbness.  He denies any recent viral illnesses or illness of any type.  He denies new medication or changes in his medications.  He states he is compliant with his medications  Addendum: Spoke with patient's Sister Daniel Stuart.  Patient has been getting progressively weaker over the last year.  Now walking with a walker as opposed to cane.  He was seen by neurology and was recently evaluated for Parkinson disease, he was not given a diagnosis of Parkinsons.  Over the past 3 months the patient has started to fall.  He is fallen 3 times and twice yesterday.  Prior to year ago he had had no PT, but with his progressive weakness he is now receiving PT.  He has had PT 3 times weekly for the last 3 months.  He was recently assessed and approved for a wheelchair.  His most recent diagnosis was vitamin D deficiency.  History provided by patient, and his Sister Daniel Stuart via phone.  Review of Systems:  Weakness and fall  Past Medical History: Past Medical History:  Diagnosis Date   Anxiety    Arrhythmia as indication for cardiac pacemaker replacement 03/2020   Frequent falls  03/2020   History of loop recorder 04/2017   Stroke (cerebrum) (HCC)    Tobacco use    Urinary frequency 09/2019   Vitamin D deficiency    Past Surgical History:  Procedure Laterality Date   FOOT SURGERY     LOOP RECORDER INSERTION N/A 04/13/2017   Procedure: Loop Recorder Insertion;  Surgeon: Marinus Maw, MD;  Location: [facility] INVASIVE CV LAB;  Service: Cardiovascular;  Laterality: N/A;   TEE WITHOUT CARDIOVERSION N/A 04/13/2017   Procedure: TRANSESOPHAGEAL ECHOCARDIOGRAM (TEE);  Surgeon: Jake Bathe, MD;  Location: [facility] ENDOSCOPY;  Service: Cardiovascular;  Laterality: N/A;    Medications: Prior to Admission medications   Medication Sig Start Date End Date Taking? Authorizing Provider  aspirin 81 MG EC tablet Take 1 tablet (81 mg total) by mouth daily. 10/09/21   Barbette Merino, NP  atorvastatin (LIPITOR) 80 MG tablet TAKE 1 TABLET (80 MG TOTAL) BY MOUTH EVERY EVENING. 02/22/22 02/22/23  Passmore, Enid Derry I, NP  gabapentin (NEURONTIN) 300 MG capsule TAKE 1 CAPSULE BY MOUTH BY MOUTH TWO TIMES DAILY 04/22/22 04/22/23  Drema Dallas, DO  oxybutynin (DITROPAN-XL) 10 MG 24 hr tablet Take 1 tablet (10 mg total) by mouth at bedtime. 10/09/21 11/04/22  Barbette Merino, NP    Allergies:  No Known Allergies  Social History:  reports that he has been smoking cigarettes. He started smoking about 44 years ago. He has been smoking an average of 1 pack per day. He has never used smokeless tobacco. He reports that he does  not drink alcohol and does not use drugs.  Family History: Family History  Problem Relation Age of Onset   Diabetes Mother    COPD Mother    Diabetes Father    Heart failure Father    Cancer Sister     Physical Exam: Vitals:   09/07/22 2157 09/07/22 2230 09/07/22 2259 09/07/22 2300  BP: (!) 110/57 102/71  105/66  Pulse: 81 65  75  Resp: 18 12  16   Temp:   98.1 F (36.7 C)   TempSrc:   Oral   SpO2: 95% 99%  99%  Height:        General:  Alert and oriented times three,  no acute distress.  Baseline stutter, with intermittent word finding difficulty Eyes: PERRLA, pink conjunctiva, no scleral icterus ENT: Moist oral mucosa, neck supple, no thyromegaly Lungs: clear to ascultation, no wheeze, no crackles, no use of accessory muscles Cardiovascular: regular rate and rhythm, no regurgitation, no gallops, no murmurs. No carotid bruits, no JVD.  Loop recorder left chest wall Abdomen: soft, positive BS, non-tender, non-distended, no organomegaly, not an acute abdomen GU: not examined Neuro: Inability to flex feet but gross movement of lifting but intact. reflexes normal.  Lower extremity spasticity greater on the right Musculoskeletal: strength 5/5 all extremities, no clubbing, cyanosis or edema Skin: no rash, no subcutaneous crepitation, no decubitus Psych: appropriate patient   Labs on Admission:  Recent Labs    09/07/22 2140  NA 142  K 3.5  CL 104  CO2 25  GLUCOSE 92  BUN 6  CREATININE 1.29*  CALCIUM 8.8*   Recent Labs    09/07/22 2140  AST 24  ALT 17  ALKPHOS 74  BILITOT 1.2  PROT 6.3*  ALBUMIN 3.2*   No results for input(s): "LIPASE", "AMYLASE" in the last 72 hours. Recent Labs    09/07/22 2029  WBC 16.1*  NEUTROABS 13.6*  HGB 12.4*  HCT 34.2*  MCV 93.4  PLT 172   Recent Labs    09/06/22 1330  CHOL 93*  HDL 38*  LDLCALC 41  TRIG 62  CHOLHDL 2.4    Radiological Exams on Admission: No results found.  Assessment/Plan Present on Admission: Bilateral lower extremity weakness/falls -Admitted to med telemetry -Suspected progressive weakness from prior CVA. -MRI calling overnight to do MRI head/cervical/thoracic and lumbar spines.  These were not done as patient was a bit more confused at 2 AM and was unable to provide cohesive answers.  When patient reassessed by me at 4 AM, patient more at baseline able to provide appropriate answers. -Neurology consult placed in ER -PT/OT consult placed -Aspirin and atorvastatin  resumed  AKI, mild -Gentle IV fluid hydration  Leukocytosis -We will order UA and chest x-ray.  No clinical evidence of infection.  Dyslipidemia -Stable, resume statin  Chronic tobacco use -Nicotine patch ordered  Peripheral vascular disease Peripheral arterial disease  -Aware  Frequent falls/gait stability/parkinsonism -See above  Bilateral vision loss -very poor vision     Sindy Mccune 09/07/2022, 11:14 PM

## 2022-09-07 NOTE — ED Triage Notes (Signed)
Pt BIB [facility] for unwitnessed fall Pt alert and oriented x3. Pt in c-collar. Pt reports falling and getting weak.

## 2022-09-07 NOTE — ED Notes (Signed)
Patient provided with bedside urinal 

## 2022-09-07 NOTE — ED Notes (Signed)
Patient unable to urinate in urinal and states "urinated all over himself." Patient provided with clean and dry hospital attire with clean linens and blankets

## 2022-09-07 NOTE — ED Provider Notes (Signed)
[facility] EMERGENCY DEPARTMENT Provider Note   CSN: [id number] Arrival date & time: 09/07/22  1907     History {Add pertinent medical, surgical, social history, OB history to HPI:1} Chief Complaint  Patient presents with   Daniel Stuart is a 56 y.o. male with Hx of prior CVA, chronic tobacco use, dyslipidemia, peripheral vascular disease, carotid arterial disease, anxiety, frequent falls, peripheral arterial disease, and gait stability, parkinsonism.  Presenting to the ED today due to an unwitnessed fall.  Patient states he fell twice today, both times did not hit his head, LOC, or injure anything specifically.  Denies head pain, vision changes, new joint pain, lightheadedness, dizziness, chest pain, or shortness of breath preceding or after the falls.  First time he fell earlier this morning, states it was a mild slip to the floor due to the same leg weakness.  Denies any injury, though EMS came to help assist him up into a position of comfort.  States when he fell the second time, he was sitting on the commode and went from a sitting to a standing position when his legs felt even weaker.  States his legs gave out from under him and he fell to the floor somewhat slowly.  Laid there for 1 hour before able to bring himself to pull the call lever.  EMS was then called again and this time was brought to the ED.    Patient reports significant weakness of both legs over the last 24 hours.  Has been following up with neurology over the last few years since prior CVA.   Not on anticoagulation.  Per personal review of records, visit with [facility] from 04/22/22 evaluated for spastic hemiplegia of right dominant side as late effect of cerebral infarction, and bilateral vision loss.  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  The history is provided by the patient and medical records.  Fall       Home Medications Prior to Admission medications   Medication Sig Start Date End Date Taking? Authorizing Provider  aspirin 81 MG EC tablet Take 1 tablet (81 mg total) by mouth daily. 10/09/21   Barbette Merino, NP  atorvastatin (LIPITOR) 80 MG tablet TAKE 1 TABLET (80 MG TOTAL) BY MOUTH EVERY EVENING. 02/22/22 02/22/23  Passmore, Enid Derry I, NP  gabapentin (NEURONTIN) 300 MG capsule TAKE 1 CAPSULE BY MOUTH BY MOUTH TWO TIMES DAILY 04/22/22 04/22/23  Drema Dallas, DO  oxybutynin (DITROPAN-XL) 10 MG 24 hr tablet Take 1 tablet (10 mg total) by mouth at bedtime. 10/09/21 11/04/22  Barbette Merino, NP      Allergies    Patient has no known allergies.    Review of Systems   Review of Systems  Constitutional:  Positive for fatigue.       Fall  Neurological:  Positive for weakness.    Physical Exam Updated Vital Signs BP 105/66   Pulse 75   Temp 98.1 F (36.7 C) (Oral)   Resp 16   Ht 5\' 6"  (1.676 m)   SpO2 99%   BMI 19.85 kg/m  Physical Exam Vitals and nursing note reviewed.  Constitutional:      General: He is not in acute distress.    Appearance: He is well-developed. He is not ill-appearing.  Comments: Clinically weak appearing  HENT:     Head: Normocephalic and atraumatic.     Nose: Nose normal.     Mouth/Throat:     Mouth: Mucous membranes are moist.     Pharynx: Oropharynx is clear.  Eyes:     Extraocular Movements: Extraocular movements intact.     Conjunctiva/sclera: Conjunctivae normal.     Pupils: Pupils are equal, round, and reactive to light.  Neck:     Comments: Very supple on exam, no meningismus or torticollis Cardiovascular:     Rate and Rhythm: Normal rate and regular rhythm.     Pulses: Normal pulses.     Heart sounds: No murmur heard.    Comments: Radial, DP, PT pulses 2+ bilaterally.  CRT  less than 2 Pulmonary:     Effort: Pulmonary effort is normal. No respiratory distress.     Breath sounds: Normal breath sounds.  Chest:     Chest wall: No tenderness.  Abdominal:     Palpations: Abdomen is soft.     Tenderness: There is no abdominal tenderness. There is no guarding.  Musculoskeletal:        General: No swelling or tenderness.     Cervical back: Neck supple. No rigidity.     Right lower leg: No edema.     Left lower leg: No edema.  Skin:    General: Skin is warm and dry.     Capillary Refill: Capillary refill takes less than 2 seconds.     Coloration: Skin is not jaundiced or pale.  Neurological:     Mental Status: He is alert and oriented to person, place, and time.     Sensory: No sensory deficit.     Motor: Weakness (Lower extremities with decreased strength) present.     Coordination: Coordination abnormal (Right upper extremity with abnormal finger-nose-finger; lower extremities difficult to discern due to apparent weakness on exam).     Comments: No facial asymmetry.  Slowed speech and response.  Appears to have some dysarthria with difficulty finding words about 50% of time.  Difficult to discern pronator drift due to overall clinical weakness, though appears overtly negative.  PERRLA.  No truncal deviation though with mild truncal weakness.  EOMs with no overt nystagmus.  Gaze aligned appropriately.  CN V and VII appear grossly intact.  CN IX-XII appear grossly intact.  No dysphagia.    Psychiatric:        Mood and Affect: Mood normal.    ED Results / Procedures / Treatments   Labs (all labs ordered are listed, but only abnormal results are displayed) Labs Reviewed  CBC - Abnormal; Notable for the following components:      Result Value   WBC 16.1 (*)    RBC 3.66 (*)    Hemoglobin 12.4 (*)    HCT 34.2 (*)    MCHC 36.3 (*)    All other components within normal limits  DIFFERENTIAL - Abnormal; Notable for the following components:   Neutro Abs 13.6 (*)     All other components within normal limits  COMPREHENSIVE METABOLIC PANEL - Abnormal; Notable for the following components:   Creatinine, Ser 1.29 (*)    Calcium 8.8 (*)    Total Protein 6.3 (*)    Albumin 3.2 (*)    All other components within normal limits  ETHANOL  URINALYSIS, ROUTINE W REFLEX MICROSCOPIC    EKG EKG Interpretation  Date/Time:  Tuesday September 07 2022 19:10:03 EST Ventricular Rate:  93 PR Interval:  126 QRS Duration: 81 QT Interval:  326 QTC Calculation: 406 R Axis:   -25 Text Interpretation: Sinus rhythm Right atrial enlargement Borderline left axis deviation Abnormal R-wave progression, early transition ST elevation, consider inferior injury when com,pared to prior,  faster rate. No STEMI Confirmed by Theda Belfast ([id number]) on 09/07/2022 7:13:00 PM  Radiology No results found.  Procedures Procedures  {Document cardiac monitor, telemetry assessment procedure when appropriate:1}  Medications Ordered in ED Medications - No data to display  ED Course/ Medical Decision Making/ A&P Clinical Course as of 09/07/22 2326  Tue Sep 07, 2022  2200 Consulted with Dr. Iver Nestle of neurology.  Discussed patient's history, presentation, case in detail.  With significant history of prior embolic CVA, watershed chronic infarctions, and progressive neurological symptoms, new onset bilateral leg weakness concerning for possible brain or spinal lesion.  Recommends MRI of brain and full spine.  Also new difficulty with ambulation and worsening ability to perform ADLs, in addition to elevated WBC with known cause, anticipate admission to medicine in any case. [AC]    Clinical Course User Index [AC] Cecil Cobbs, PA-C       CHA2DS2-VASc Score: 3                    Medical Decision Making Amount and/or Complexity of Data Reviewed Labs: ordered. Radiology: ordered.  Risk Decision regarding hospitalization.   56 y.o. male presents to the ED for concern of Fall    This involves an extensive number of treatment options, and is a complaint that carries with it a high risk of complications and morbidity.  The emergent differential diagnosis prior to evaluation includes, but is not limited to: ***  This is not an exhaustive differential.   Past Medical History / Co-morbidities / Social History: Hx of prior CVA, chronic tobacco use, dyslipidemia, peripheral vascular disease, carotid arterial disease, anxiety, frequent falls, peripheral arterial disease, and gait stability, parkinsonism Social Determinants of Health include: Difficulty with ADLs  Additional History:  Obtained by chart review.   Notably recent [facility] neurology note from 04/22/2022, which details "Over the past couple of months, he endorses worsening right sided weakness.  His gait is worse.  It used to be shuffling but now he appears to be dragging his right leg more.  He may have vision problems.  When he goes to turn right, he may walk in a complete circle before continuing to the right.  Over the past 3-4 months, he has had worsening difficulty getting words out.  He tends to drop objects more often when carrying things.  He has recently started PT/OT." Also MRI of 04/11/2017: "Multiple small areas of acute infarct in both cerebral hemisphere most likely due to emboli.  Occlusion of left internal carotid artery.  Extensive chronic ischemia bilaterally.  Probable chronic watershed infarct in both cerebral hemispheres." MRA of 04/11/2017: "1. Occlusion of the left internal carotid artery with flow related enhancement at the carotid terminus and normal appearance of the left anterior and middle cerebral arteries, secondary to collateral flow across the complete circle of Willis.  2. Otherwise normal intracranial MRA."  Lab Tests: I ordered, and personally interpreted labs.  The pertinent results include:   Mild anemia of Hgb 12.4, HCT 34.2.  Elevated WBC 16.  With leukocytosis   Imaging  Studies: I ordered imaging studies including MR brain.   I independently visualized and interpreted imaging which showed *** I agree with the radiologist interpretation.  ED Course: Pt overall well-appearing on exam.  With significant history listed above.  Presenting to the ED with 2 falls due to reported bilateral leg weakness over the last 24 hours.  First fall was recently rated by EMS, without severe injury or complaints.  Second fall, patient brought in via EMS.  Without LOC, head injury, vision changes, or upper extremity weakness.  Without chest pain, shortness of breath, new back pain, or abdominal pain prior to or following the fall.  No N/V/D.  No recent URI. Per in-depth review of the patient's neurological history, patient has been following with neurology for progressive right-sided weakness, possible parkinsonism, and multiple deficits following what appears to be an embolic CVA in the past (identified in 2018).  With occluded left ICA, extensive bilateral chronic ischemia of the brain, and likely chronic watershed infarcts in both cerebral hemispheres.   On exam, right-sided weakness appreciated.  No facial asymmetry, poor cooperation with pronator drift due to overall fatigue.  No overt drift appreciated with limited participation.  Difficulty with lower extremity weakness and ROM.  Patient states this there is usual weakness, however this appears more than normal.  With mild right-sided weakness more than left-sided weakness.  Noticeable difficulty finding words, which correlates to recent neurology follow-up of 04/2022, however the extent is difficult to discern whether there is new/worsening dysarthria or whether this is chronic and unchanged.  Poor cooperation with EOMs due to patient's reported fatigue, though with no overt nystagmus appreciated.  Gaze aligned appropriately in general.  Also noted on recent neurology visit to have preceding bilateral vision loss that is being evaluated.   Patient's symptom duration within 24 hours, outside of the stroke window.  Discussed patient with attending, plan to consult with neurology for further recommendations such as MRI imaging.  Lower initial suspicion for GBS, more concerned for CVA at this time. Consultation with neurology as above.  CHA2DS2-VASc Score: 3.  Pt again with history of bilateral CVA/infarcts.  Pt is high risk for recurrent CVA.  Anticipate admission to medicine due to significant new weakness, new onset inability to ambulate, significant risk factors, and worsening neurological deficits.     Disposition: Admission  I discussed this case with my attending physician Dr. Sherry Ruffing, who agreed with the proposed treatment course and cosigned this note including patient's presenting symptoms, physical exam, and planned diagnostics and interventions.  Attending physician stated agreement with plan or made changes to plan which were implemented.     This chart was dictated using voice recognition software.  Despite best efforts to proofread, errors can occur which can change the documentation meaning.   {Document critical care time when appropriate:1} {Document review of labs and clinical decision tools ie heart score, Chads2Vasc2 etc:1}  {Document your independent review of radiology images, and any outside records:1} {Document your discussion with family members, caretakers, and with consultants:1} {Document social determinants of health affecting pt's care:1} {Document your decision making why or why not admission, treatments were needed:1} Final Clinical Impression(s) / ED Diagnoses Final diagnoses:  Fall, initial encounter  Leg weakness, bilateral  History of CVA (cerebrovascular accident)    Rx / DC Orders ED Discharge Orders     None

## 2022-09-07 NOTE — Progress Notes (Signed)
Lipid panel is stable, continue atorvastatin 80mg  daily   Vitamin D level is low , start taking OTC vitamin D 1000 units daily.

## 2022-09-08 ENCOUNTER — Observation Stay (HOSPITAL_COMMUNITY): Payer: Medicare Other

## 2022-09-08 DIAGNOSIS — Z833 Family history of diabetes mellitus: Secondary | ICD-10-CM | POA: Diagnosis not present

## 2022-09-08 DIAGNOSIS — F17210 Nicotine dependence, cigarettes, uncomplicated: Secondary | ICD-10-CM | POA: Diagnosis present

## 2022-09-08 DIAGNOSIS — I6340 Cerebral infarction due to embolism of unspecified cerebral artery: Secondary | ICD-10-CM | POA: Diagnosis present

## 2022-09-08 DIAGNOSIS — R29898 Other symptoms and signs involving the musculoskeletal system: Secondary | ICD-10-CM | POA: Diagnosis not present

## 2022-09-08 DIAGNOSIS — I6389 Other cerebral infarction: Secondary | ICD-10-CM | POA: Diagnosis not present

## 2022-09-08 DIAGNOSIS — Z825 Family history of asthma and other chronic lower respiratory diseases: Secondary | ICD-10-CM | POA: Diagnosis not present

## 2022-09-08 DIAGNOSIS — D72829 Elevated white blood cell count, unspecified: Secondary | ICD-10-CM | POA: Diagnosis present

## 2022-09-08 DIAGNOSIS — E785 Hyperlipidemia, unspecified: Secondary | ICD-10-CM | POA: Diagnosis present

## 2022-09-08 DIAGNOSIS — I639 Cerebral infarction, unspecified: Secondary | ICD-10-CM

## 2022-09-08 DIAGNOSIS — I6522 Occlusion and stenosis of left carotid artery: Secondary | ICD-10-CM | POA: Diagnosis present

## 2022-09-08 DIAGNOSIS — Z8249 Family history of ischemic heart disease and other diseases of the circulatory system: Secondary | ICD-10-CM | POA: Diagnosis not present

## 2022-09-08 DIAGNOSIS — R296 Repeated falls: Secondary | ICD-10-CM | POA: Diagnosis present

## 2022-09-08 DIAGNOSIS — E559 Vitamin D deficiency, unspecified: Secondary | ICD-10-CM | POA: Diagnosis present

## 2022-09-08 DIAGNOSIS — R29702 NIHSS score 2: Secondary | ICD-10-CM | POA: Diagnosis present

## 2022-09-08 DIAGNOSIS — G9389 Other specified disorders of brain: Secondary | ICD-10-CM | POA: Diagnosis present

## 2022-09-08 DIAGNOSIS — D649 Anemia, unspecified: Secondary | ICD-10-CM | POA: Diagnosis present

## 2022-09-08 DIAGNOSIS — I700 Atherosclerosis of aorta: Secondary | ICD-10-CM | POA: Diagnosis present

## 2022-09-08 DIAGNOSIS — N179 Acute kidney failure, unspecified: Secondary | ICD-10-CM | POA: Diagnosis present

## 2022-09-08 DIAGNOSIS — H543 Unqualified visual loss, both eyes: Secondary | ICD-10-CM | POA: Diagnosis present

## 2022-09-08 DIAGNOSIS — E538 Deficiency of other specified B group vitamins: Secondary | ICD-10-CM | POA: Diagnosis present

## 2022-09-08 DIAGNOSIS — R29710 NIHSS score 10: Secondary | ICD-10-CM | POA: Diagnosis not present

## 2022-09-08 DIAGNOSIS — R29810 Facial weakness: Secondary | ICD-10-CM | POA: Diagnosis present

## 2022-09-08 DIAGNOSIS — Z79899 Other long term (current) drug therapy: Secondary | ICD-10-CM | POA: Diagnosis not present

## 2022-09-08 DIAGNOSIS — Z7982 Long term (current) use of aspirin: Secondary | ICD-10-CM | POA: Diagnosis not present

## 2022-09-08 DIAGNOSIS — I69351 Hemiplegia and hemiparesis following cerebral infarction affecting right dominant side: Secondary | ICD-10-CM | POA: Diagnosis not present

## 2022-09-08 DIAGNOSIS — G20C Parkinsonism, unspecified: Secondary | ICD-10-CM | POA: Diagnosis present

## 2022-09-08 DIAGNOSIS — I6310 Cerebral infarction due to embolism of unspecified precerebral artery: Secondary | ICD-10-CM | POA: Diagnosis not present

## 2022-09-08 LAB — LIPID PANEL
Cholesterol: 76 mg/dL (ref 0–200)
HDL: 30 mg/dL — ABNORMAL LOW (ref >40)
LDL Cholesterol: 35 mg/dL (ref 0–99)
Total CHOL/HDL Ratio: 2.5 RATIO
Triglycerides: 55 mg/dL (ref <150)
VLDL: 11 mg/dL (ref 0–40)

## 2022-09-08 LAB — CBC
HCT: 33.7 % — ABNORMAL LOW (ref 39.0–52.0)
Hemoglobin: 12 g/dL — ABNORMAL LOW (ref 13.0–17.0)
MCH: 33.7 pg (ref 26.0–34.0)
MCHC: 35.6 g/dL (ref 30.0–36.0)
MCV: 94.7 fL (ref 80.0–100.0)
Platelets: 129 10*3/uL — ABNORMAL LOW (ref 150–400)
RBC: 3.56 MIL/uL — ABNORMAL LOW (ref 4.22–5.81)
RDW: 15.4 % (ref 11.5–15.5)
WBC: 10.7 10*3/uL — ABNORMAL HIGH (ref 4.0–10.5)
nRBC: 0 % (ref 0.0–0.2)

## 2022-09-08 LAB — COMPREHENSIVE METABOLIC PANEL
ALT: 16 U/L (ref 0–44)
AST: 35 U/L (ref 15–41)
Albumin: 3.1 g/dL — ABNORMAL LOW (ref 3.5–5.0)
Alkaline Phosphatase: 74 U/L (ref 38–126)
Anion gap: 10 (ref 5–15)
BUN: 6 mg/dL (ref 6–20)
CO2: 26 mmol/L (ref 22–32)
Calcium: 8.6 mg/dL — ABNORMAL LOW (ref 8.9–10.3)
Chloride: 104 mmol/L (ref 98–111)
Creatinine, Ser: 1.18 mg/dL (ref 0.61–1.24)
GFR, Estimated: >60 mL/min (ref >60)
Glucose, Bld: 94 mg/dL (ref 70–99)
Potassium: 4.1 mmol/L (ref 3.5–5.1)
Sodium: 140 mmol/L (ref 135–145)
Total Bilirubin: 1.5 mg/dL — ABNORMAL HIGH (ref 0.3–1.2)
Total Protein: 6.1 g/dL — ABNORMAL LOW (ref 6.5–8.1)

## 2022-09-08 LAB — ECHOCARDIOGRAM COMPLETE
Area-P 1/2: 2.99 cm2
Height: 66 in
S' Lateral: 2.6 cm

## 2022-09-08 LAB — HEMOGLOBIN A1C
Hgb A1c MFr Bld: 5.6 % (ref 4.8–5.6)
Mean Plasma Glucose: 114.02 mg/dL

## 2022-09-08 LAB — HIV ANTIBODY (ROUTINE TESTING W REFLEX): HIV Screen 4th Generation wRfx: NONREACTIVE

## 2022-09-08 MED ORDER — ENOXAPARIN SODIUM 40 MG/0.4ML IJ SOSY
40.0000 mg | PREFILLED_SYRINGE | INTRAMUSCULAR | Status: DC
  Administered 2022-09-08 – 2022-09-10 (×3): 40 mg via SUBCUTANEOUS
  Filled 2022-09-08 (×3): qty 0.4

## 2022-09-08 MED ORDER — GABAPENTIN 300 MG PO CAPS
300.0000 mg | ORAL_CAPSULE | Freq: Two times a day (BID) | ORAL | Status: DC
  Administered 2022-09-08 – 2022-09-10 (×6): 300 mg via ORAL
  Filled 2022-09-08 (×7): qty 1

## 2022-09-08 MED ORDER — ACETAMINOPHEN 160 MG/5ML PO SOLN: 650.0000 mg | ORAL | Status: DC | PRN

## 2022-09-08 MED ORDER — NICOTINE 21 MG/24HR TD PT24
21.0000 mg | MEDICATED_PATCH | Freq: Every day | TRANSDERMAL | Status: DC
  Administered 2022-09-08 – 2022-09-10 (×3): 21 mg via TRANSDERMAL
  Filled 2022-09-08 (×3): qty 1

## 2022-09-08 MED ORDER — ACETAMINOPHEN 325 MG PO TABS: 650.0000 mg | ORAL_TABLET | ORAL | Status: DC | PRN

## 2022-09-08 MED ORDER — ATORVASTATIN CALCIUM 80 MG PO TABS
80.0000 mg | ORAL_TABLET | Freq: Every day | ORAL | Status: DC
  Administered 2022-09-08 – 2022-09-10 (×3): 80 mg via ORAL
  Filled 2022-09-08: qty 1
  Filled 2022-09-08: qty 2
  Filled 2022-09-08: qty 1

## 2022-09-08 MED ORDER — STROKE: EARLY STAGES OF RECOVERY BOOK
Freq: Once | Status: AC
  Filled 2022-09-08: qty 1

## 2022-09-08 MED ORDER — ACETAMINOPHEN 650 MG RE SUPP: 650.0000 mg | RECTAL | Status: DC | PRN

## 2022-09-08 MED ORDER — OXYBUTYNIN CHLORIDE ER 5 MG PO TB24
10.0000 mg | ORAL_TABLET | Freq: Every day | ORAL | Status: DC
  Administered 2022-09-08 – 2022-09-09 (×3): 10 mg via ORAL
  Filled 2022-09-08: qty 2
  Filled 2022-09-08: qty 1
  Filled 2022-09-08: qty 2
  Filled 2022-09-08: qty 1

## 2022-09-08 MED ORDER — ASPIRIN 81 MG PO TBEC
81.0000 mg | DELAYED_RELEASE_TABLET | Freq: Every day | ORAL | Status: DC
  Administered 2022-09-08 – 2022-09-10 (×3): 81 mg via ORAL
  Filled 2022-09-08 (×3): qty 1

## 2022-09-08 MED ORDER — SENNOSIDES-DOCUSATE SODIUM 8.6-50 MG PO TABS: 1.0000 | ORAL_TABLET | Freq: Every evening | ORAL | Status: DC | PRN

## 2022-09-08 MED ORDER — GADOBUTROL 1 MMOL/ML IV SOLN
5.0000 mL | Freq: Once | INTRAVENOUS | Status: AC | PRN
  Administered 2022-09-08: 5 mL via INTRAVENOUS

## 2022-09-08 NOTE — ED Notes (Signed)
Bedside swallow screen completed.  Pt passed.  MD made aware.

## 2022-09-08 NOTE — ED Notes (Signed)
Pt returned from CV testing.

## 2022-09-08 NOTE — Progress Notes (Signed)
PROGRESS NOTE    Daniel Stuart  YBF:[id number] DOB: 1966-07-07 DOA: 09/07/2022 PCP: Donell Beers, [name]    Brief Narrative:  56 year old male who normally lives independently, prior history of stroke, presents to the hospital with worsening bilateral lower extremity weakness and recurrent falls.  Work-up in the emergency room with MRI does show bilateral acute cortical infarcts.  He is being admitted for further evaluation.   Assessment & Plan:   Principal Problem:   Weakness of both lower extremities Active Problems:   CVA (cerebral vascular accident) (HCC)   Tobacco abuse   Dyslipidemia   PVD (peripheral vascular disease) (HCC)   Frequent falls   Impaired mobility   PAD (peripheral artery disease) (HCC)   Acute CVA -MRI brain shows bilateral cortical infarcts -Currently on aspirin -He is already on high intensity statin, LDL 35 -A1c 5.6 -PT/OT evaluation pending -He passed bedside swallow screen -Echo/carotid Dopplers have been ordered -Discussed with neurology who will see in consult  Frequent falls -Has baseline bilateral lower extremity weakness and right sided weakness from prior stroke -Normally ambulates with a rollator -PT/OT  Hyperlipidemia -Continue statin  Leukocytosis -Afebrile, no signs of infection -Chest x-ray without pneumonia -UA has been ordered -Possibly related to dehydration -WBC count has trended down with IV fluids      DVT prophylaxis: enoxaparin (LOVENOX) injection 40 mg Start: 09/08/22 1000 SCD's Start: 09/08/22 0049  Code Status: Full code Family Communication: updated sister over the phone Disposition Plan: Status is: Observation The patient will require care spanning > 2 midnights and should be moved to inpatient because: Continued work-up for stroke and inpatient neurology evaluation     Consultants:  Neurology  Procedures:    Antimicrobials:      Subjective: Patient denies any complaints at this time.   He does admit to having falls recently and worsening lower extremity weakness bilaterally.  Objective: Vitals:   09/07/22 2345 09/08/22 0000 09/08/22 0346 09/08/22 1158  BP: 96/69 99/69  97/68  Pulse: (!) 56 61  61  Resp: 12 13  14   Temp:   98.1 F (36.7 C) 98 F (36.7 C)  TempSrc:   Oral   SpO2: 98% 100%  100%  Height:       No intake or output data in the 24 hours ending 09/08/22 1315 There were no vitals filed for this visit.  Examination:  General exam: Appears calm and comfortable  Respiratory system: Clear to auscultation. Respiratory effort normal. Cardiovascular system: S1 & S2 heard, RRR. No JVD, murmurs, rubs, gallops or clicks. No pedal edema. Gastrointestinal system: Abdomen is nondistended, soft and nontender. No organomegaly or masses felt. Normal bowel sounds heard. Central nervous system: Alert and oriented. 4/5 strength in bilateral lower extremities, upper extremities 5/5 bilaterally Extremities: no edema bilaterally Skin: No rashes, lesions or ulcers Psychiatry: Judgement and insight appear normal. Mood & affect appropriate.     Data Reviewed: I have personally reviewed following labs and imaging studies  CBC: Recent Labs  Lab 09/07/22 2029 09/08/22 0300  WBC 16.1* 10.7*  NEUTROABS 13.6*  --   HGB 12.4* 12.0*  HCT 34.2* 33.7*  MCV 93.4 94.7  PLT 172 129*   Basic Metabolic Panel: Recent Labs  Lab 09/07/22 2140 09/08/22 0300  NA 142 140  K 3.5 4.1  CL 104 104  CO2 25 26  GLUCOSE 92 94  BUN 6 6  CREATININE 1.29* 1.18  CALCIUM 8.8* 8.6*   GFR: Estimated Creatinine Clearance: 53.9 mL/min (by  C-G formula based on SCr of 1.18 mg/dL). Liver Function Tests: Recent Labs  Lab 09/07/22 2140 09/08/22 0300  AST 24 35  ALT 17 16  ALKPHOS 74 74  BILITOT 1.2 1.5*  PROT 6.3* 6.1*  ALBUMIN 3.2* 3.1*   No results for input(s): "LIPASE", "AMYLASE" in the last 168 hours. No results for input(s): "AMMONIA" in the last 168 hours. Coagulation  Profile: No results for input(s): "INR", "PROTIME" in the last 168 hours. Cardiac Enzymes: No results for input(s): "CKTOTAL", "CKMB", "CKMBINDEX", "TROPONINI" in the last 168 hours. BNP (last 3 results) No results for input(s): "PROBNP" in the last 8760 hours. HbA1C: Recent Labs    09/08/22 0300  HGBA1C 5.6   CBG: No results for input(s): "GLUCAP" in the last 168 hours. Lipid Profile: Recent Labs    09/06/22 1330 09/08/22 0300  CHOL 93* 76  HDL 38* 30*  LDLCALC 41 35  TRIG 62 55  CHOLHDL 2.4 2.5   Thyroid Function Tests: No results for input(s): "TSH", "T4TOTAL", "FREET4", "T3FREE", "THYROIDAB" in the last 72 hours. Anemia Panel: No results for input(s): "VITAMINB12", "FOLATE", "FERRITIN", "TIBC", "IRON", "RETICCTPCT" in the last 72 hours. Sepsis Labs: No results for input(s): "PROCALCITON", "LATICACIDVEN" in the last 168 hours.  No results found for this or any previous visit (from the past 240 hour(s)).       Radiology Studies: MR Lumbar Spine W Wo Contrast  Result Date: 09/08/2022 CLINICAL DATA:  56 year old male with history of cardiac arrhythmia, left carotid occlusion, chronic cerebral ischemia. Worsening gait instability with frequent falls. Progressive lower extremity weakness. EXAM: MRI LUMBAR SPINE WITHOUT AND WITH CONTRAST TECHNIQUE: Multiplanar and multiecho pulse sequences of the lumbar spine were obtained without and with intravenous contrast. CONTRAST:  15mL GADAVIST GADOBUTROL 1 MMOL/ML IV SOLN in conjunction with contrast enhanced imaging of the brain, cervical and thoracic spine reported separately. COMPARISON:  Brain, cervical and thoracic MRI today reported separately. FINDINGS: Segmentation: Appears to be normal and concordant with the thoracic MRI numbering today. Alignment:  Normal aside from mild straightening of lumbar lordosis. Vertebrae: No marrow edema or evidence of acute osseous abnormality. Visualized bone marrow signal is within normal limits.  Intact visible sacrum and SI joints. Conus medullaris and cauda equina: Conus extends to the L1 level. No lower spinal cord or conus signal abnormality. Capacious spinal canal. Cauda equina nerve roots appear normal, with no nerve root thickening or abnormal enhancement. Paraspinal and other soft tissues: Negative. Disc levels: Essentially normal for age. Disc desiccation and disc bulging most pronounced at L5-S1. No spinal stenosis. Mild to moderate multifactorial bilateral L5 neural foraminal stenosis. IMPRESSION: Normal for age MRI appearance of the lumbar spine, with capacious spinal canal and normal cauda equina nerve roots. Electronically Signed   By: Odessa Fleming M.D.   On: 09/08/2022 10:52   MR THORACIC SPINE W WO CONTRAST  Result Date: 09/08/2022 CLINICAL DATA:  56 year old male with history of cardiac arrhythmia, left carotid occlusion, chronic cerebral ischemia. Worsening gait instability with frequent falls. Progressive lower extremity weakness. EXAM: MRI THORACIC WITHOUT AND WITH CONTRAST TECHNIQUE: Multiplanar and multiecho pulse sequences of the thoracic spine were obtained without and with intravenous contrast. CONTRAST:  85mL GADAVIST GADOBUTROL 1 MMOL/ML IV SOLN in conjunction with contrast enhanced imaging of the brain and cervical spine reported separately. COMPARISON:  None Available. FINDINGS: Limited cervical spine imaging:  Reported separately today. Thoracic spine segmentation:  Appears to be normal. Alignment: Relatively normal thoracic kyphosis. No significant scoliosis or spondylolisthesis. Vertebrae:  Visualized bone marrow signal is within normal limits. No marrow edema or evidence of acute osseous abnormality. Cord: Capacious thoracic spinal canal throughout. No thoracic cord signal abnormality or volume loss identified. No abnormal intradural enhancement or dural thickening. Conus medullaris appears to be normal at T12-L1. Paraspinal and other soft tissues: Negative. Disc levels: Normal  for age. No significant degenerative disease or neural impingement identified. IMPRESSION: Negative MRI appearance of the thoracic spine and spinal cord. Electronically Signed   By: Odessa Fleming M.D.   On: 09/08/2022 10:49   MR Cervical Spine W or Wo Contrast  Result Date: 09/08/2022 CLINICAL DATA:  56 year old male with history of cardiac arrhythmia, left carotid occlusion, chronic cerebral ischemia. Worsening gait instability with frequent falls. Progressive lower extremity weakness. EXAM: MRI CERVICAL SPINE WITHOUT AND WITH CONTRAST TECHNIQUE: Multiplanar and multiecho pulse sequences of the cervical spine, to include the craniocervical junction and cervicothoracic junction, were obtained without and with intravenous contrast. CONTRAST:  38mL GADAVIST GADOBUTROL 1 MMOL/ML IV SOLN in conjunction with contrast enhanced imaging of the brain reported separately. COMPARISON:  Brain MRI today reported separately. FINDINGS: Alignment: Straightening of cervical lordosis. No significant scoliosis or spondylolisthesis. Vertebrae: No marrow edema or evidence of acute osseous abnormality. Visualized bone marrow signal is within normal limits. Cord: Sagittal T2 and STIR images suggest a small abnormal focus of increased signal within the substance of the spinal cord just above the C4-C5 disc (series 13, image 8) with no associated cord expansion or enhancement. And this lesion is correlated on axial T2* as being in the central gray matter in the right hemicord series 16, image 12. There is subtle right hemi cord volume loss there. Elsewhere spinal cord signal and morphology appear within normal limits. No abnormal intradural enhancement or dural thickening. Posterior Fossa, vertebral arteries, paraspinal tissues: Cervicomedullary junction is within normal limits. There is loss of the normal left ICA flow void in the neck corresponding to known chronic occlusion. Other major vascular flow voids in the neck are preserved.  Negative visible other neck soft tissues and lung apices. Disc levels: Multilevel cervical disc, endplate and facet degeneration. But capacious underlying spinal canal. No degenerative cervical spinal stenosis. Degenerative neural foraminal stenosis is maximal at the right C3 (moderate), left C6 (moderate) and right C7 (moderate to severe) nerve levels. Thoracic spine is detailed separately today. IMPRESSION: 1. Positive for a small solitary area of abnormal spinal cord signal which seems to be central right hemicord myelomalacia near the C4-C5 disc space. 2. Cervical spine degeneration, but capacious spinal canal without spinal stenosis. Degenerative neural foraminal stenosis is occasionally moderate or severe. Electronically Signed   By: Odessa Fleming M.D.   On: 09/08/2022 10:45   MR BRAIN W WO CONTRAST  Result Date: 09/08/2022 CLINICAL DATA:  56 year old male with history of cardiac arrhythmia, left carotid occlusion, chronic cerebral ischemia. Worsening gait instability with frequent falls. Progressive lower extremity weakness. EXAM: MRI HEAD WITHOUT AND WITH CONTRAST TECHNIQUE: Multiplanar, multiecho pulse sequences of the brain and surrounding structures were obtained without and with intravenous contrast. CONTRAST:  41mL GADAVIST GADOBUTROL 1 MMOL/ML IV SOLN COMPARISON:  Brain MRI 05/10/2022. FINDINGS: Brain: Widespread and confluent also fairly symmetric appearance of abnormal hemispheric T2 and FLAIR hyperintensity in the white matter and with overlying cortical encephalomalacia. This has progressed since 2018 but was developing at that time. Underlying cerebral volume relatively maintained. No associated mass effect. SWI suggests widespread associated hemosiderin in the areas of encephalomalacia, but otherwise only occasional chronic cerebral  blood products (such as at the anterior left temporal tip). On DWI today there are scattered small, mostly punctate cortical and subcortical foci of restricted  diffusion. These are most numerous both superior hemispheres (series 2, image 38) but there are other hemispheric foci including in both temporal lobes. There is associated corpus callosum atrophy, but no discrete callosal lacunes or lesions. No posterior fossa or deep gray nuclei involvement. None of the acute areas are enhancing. No superimposed midline shift, mass effect, evidence of mass lesion, ventriculomegaly, extra-axial collection or acute intracranial hemorrhage. Cervicomedullary junction and pituitary are within normal limits. Occasional punctate enhancement associated with the chronic encephalomalacia (such as right middle frontal gyrus series 2, image 34). But no masslike or suspicious enhancement. No dural thickening. 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 Other intracranial artery tortuosity. Following contrast the major dural venous sinuses are enhancing and appear to be patent. Skull and upper cervical spine: Negative for age visible cervical spine. Visualized bone marrow signal is within normal limits. Sinuses/Orbits: Stable, negative. Other: Visible internal auditory structures appear normal. Negative visible scalp and face. IMPRESSION: 1. Scattered punctate acute cortical and subcortical infarcts in both cerebral hemispheres. No associated hemorrhage or mass effect. 2. Severe underlying hemispheric encephalomalacia, with associated advanced white matter disease. There is associated corpus callosum atrophy. Notably, the bilateral deep gray nuclei and posterior fossa are spared. But otherwise but since 2018 progression of this phenomena does seem to reflect an ischemic etiology. And there is known chronic occlusion of the Left ICA. Other etiologies to consider include: MELAS (mitochondrial encephalomyopathy with lactic acidosis and stroke-like episodes), CADASIL (cerebral autosomal dominant arteriopathy with  subcortical infarcts and leukoencephalopathy), CNS vasculitis and other small-vessel diseases. Electronically Signed   By: Odessa Fleming M.D.   On: 09/08/2022 10:36   DG Chest 1 View  Result Date: 09/08/2022 CLINICAL DATA:  Weakness.  Altered mental status. EXAM: CHEST  1 VIEW COMPARISON:  04/11/2017 FINDINGS: Trace atelectasis noted at the lung bases. Lungs otherwise clear. The cardiopericardial silhouette is within normal limits for size. Loop recorder evident. The visualized bony structures of the thorax are unremarkable. IMPRESSION: Trace bibasilar atelectasis. Electronically Signed   By: Kennith Center M.D.   On: 09/08/2022 07:48        Scheduled Meds:  [START ON 09/09/2022]  stroke: early stages of recovery book   Does not apply Once   aspirin EC  81 mg Oral Daily   atorvastatin  80 mg Oral Daily   enoxaparin (LOVENOX) injection  40 mg Subcutaneous Q24H   gabapentin  300 mg Oral BID   nicotine  21 mg Transdermal Daily   oxybutynin  10 mg Oral QHS   Continuous Infusions:   LOS: 0 days    Time spent:    Erick Blinks, MD [facility]   If 7PM-7AM, please contact night-coverage [email or web address]  09/08/2022, 1:15 PM

## 2022-09-08 NOTE — ED Notes (Signed)
Pt to CV testing.

## 2022-09-08 NOTE — ED Notes (Signed)
OT at bedside. 

## 2022-09-08 NOTE — Evaluation (Signed)
Occupational Therapy Evaluation Patient Details Name: Daniel Stuart MRN: [id number] DOB: 02/03/1966 Today's Date: 09/08/2022   History of Present Illness Pt is a 56 y/o male who presented after 2 falls at home.Imaging negative for acute fx. MRI brain showed scattered punctate infarct. PMH: multiple acute infarct in both cerebral hemispheres, parkinsonism, anxiety, tobacco use, Vitamin D deficiency, PVD.   Clinical Impression   PTA, pt lives alone, typically ambulatory with Rollator, able to manage ADLs/basic IADLs though having increased difficulty. Pt presents now with deficits in cognition, strength, balance and endurance. Pt requires Mod A for bed mobility with noted motor planning deficits. Pt endorses anxiety and a fear of falling limiting evaluation today as pt declined to attempt standing from stretcher. Overall, pt requires Min A for UB ADL and Max-Total A for LB ADLs d/t deficits. Hopeful to progress pt confidence with standing attempts in next session and feel ST SNF rehab beneficial to ensure safe return to home environment.      Recommendations for follow up therapy are one component of a multi-disciplinary discharge planning process, led by the attending physician.  Recommendations may be updated based on patient status, additional functional criteria and insurance authorization.   Follow Up Recommendations  Skilled nursing-short term rehab (<3 hours/day)    Assistance Recommended at Discharge Frequent or constant Supervision/Assistance  Patient can return home with the following A lot of help with walking and/or transfers;A lot of help with bathing/dressing/bathroom    Functional Status Assessment  Patient has had a recent decline in their functional status and demonstrates the ability to make significant improvements in function in a reasonable and predictable amount of time.  Equipment Recommendations  Other (comment) (TBD pending progress)    Recommendations for Other  Services       Precautions / Restrictions Precautions Precautions: Fall Restrictions Weight Bearing Restrictions: No      Mobility Bed Mobility Overal bed mobility: Needs Assistance Bed Mobility: Supine to Sit, Sit to Supine     Supine to sit: Mod assist, HOB elevated Sit to supine: Mod assist   General bed mobility comments: slow intiation and sequencing, when prompted to get BLE back in bed, pt attempted to swing them back off edge of stretcher    Transfers                   General transfer comment: pt declined due to fearful of falling      Balance Overall balance assessment: Needs assistance, History of Falls Sitting-balance support: No upper extremity supported, Feet supported Sitting balance-Leahy Scale: Fair                                     ADL either performed or assessed with clinical judgement   ADL Overall ADL's : Needs assistance/impaired Eating/Feeding: Independent   Grooming: Set up;Sitting   Upper Body Bathing: Minimal assistance;Sitting   Lower Body Bathing: Maximal assistance;Bed level;Sitting/lateral leans   Upper Body Dressing : Minimal assistance;Sitting   Lower Body Dressing: Maximal assistance;Sitting/lateral leans;Bed level       Toileting- Clothing Manipulation and Hygiene: Total assistance;Bed level         General ADL Comments: Limited by fear of falling and impaired motor planning (at times, extremities doing the opposite of what instructed). Only able to sit EOB despite encouragement and reassurance for attempts to stand safely     Vision Baseline Vision/History: 1 Wears glasses  Ability to See in Adequate Light: 0 Adequate Patient Visual Report: No change from baseline Vision Assessment?: No apparent visual deficits     Perception     Praxis      Pertinent Vitals/Pain Pain Assessment Pain Assessment: Faces Faces Pain Scale: Hurts a little bit Pain Location: R side Pain Descriptors /  Indicators: Grimacing, Sore Pain Intervention(s): Monitored during session, Limited activity within patient's tolerance     Hand Dominance Right   Extremity/Trunk Assessment Upper Extremity Assessment Upper Extremity Assessment: Generalized weakness   Lower Extremity Assessment Lower Extremity Assessment: Defer to PT evaluation   Cervical / Trunk Assessment Cervical / Trunk Assessment: Kyphotic   Communication Communication Communication: No difficulties;Other (comment) (though slower to respond at times)   Cognition Arousal/Alertness: Awake/alert Behavior During Therapy: WFL for tasks assessed/performed, Anxious, Flat affect Overall Cognitive Status: No family/caregiver present to determine baseline cognitive functioning                                 General Comments: flat affect, pleasant, slow processing and delayed motor planning. Nervous and fearful of falling     General Comments       Exercises     Shoulder Instructions      Home Living Family/patient expects to be discharged to:: Private residence Living Arrangements: Alone Available Help at Discharge: Family;Available PRN/intermittently Type of Home: House Home Access: Level entry     Home Layout: One level     Bathroom Shower/Tub: Producer, television/film/video: Standard     Home Equipment: Grab bars - toilet;Rollator (4 wheels);Shower Counsellor (2 wheels);Cane - single point   Additional Comments: per notes, NP placed order for power wheelchair and HH aide to assist with ADLs earlier this week      Prior Functioning/Environment Prior Level of Function : Needs assist             Mobility Comments: use of rollator for mobility ADLs Comments: reports able to complete ADLs, does what he can for IADLs, sister assists with transportation and groceries        OT Problem List: Decreased strength;Impaired balance (sitting and/or standing);Decreased activity  tolerance;Decreased cognition      OT Treatment/Interventions: Self-care/ADL training;Therapeutic exercise;Energy conservation;DME and/or AE instruction;Therapeutic activities    OT Goals(Current goals can be found in the care plan section) Acute Rehab OT Goals Patient Stated Goal: prevent falls OT Goal Formulation: With patient Time For Goal Achievement: 09/22/22 Potential to Achieve Goals: Good  OT Frequency: Min 2X/week    Co-evaluation              AM-PAC OT "6 Clicks" Daily Activity     Outcome Measure Help from another person eating meals?: A Little Help from another person taking care of personal grooming?: A Little Help from another person toileting, which includes using toliet, bedpan, or urinal?: Total Help from another person bathing (including washing, rinsing, drying)?: A Lot Help from another person to put on and taking off regular upper body clothing?: A Little Help from another person to put on and taking off regular lower body clothing?: A Lot 6 Click Score: 14   End of Session Nurse Communication: Mobility status  Activity Tolerance: Other (comment) (limited by anxiety) Patient left: in bed  OT Visit Diagnosis: Unsteadiness on feet (R26.81);Other abnormalities of gait and mobility (R26.89);Muscle weakness (generalized) (M62.81)  Time: 1207-1225 OT Time Calculation (min): 18 min Charges:  OT General Charges $OT Visit: 1 Visit OT Evaluation $OT Eval Moderate Complexity: 1 Mod  Daniel Stuart, OTR/L Acute Rehab Services Office: [phone]   Daniel Stuart 09/08/2022, 1:16 PM

## 2022-09-08 NOTE — ED Notes (Signed)
Family at bedside. Updated on plan of care.

## 2022-09-08 NOTE — ED Notes (Signed)
Pt given lunch tray.  Able to feed self.

## 2022-09-08 NOTE — ED Notes (Signed)
To XRAY

## 2022-09-08 NOTE — ED Notes (Signed)
Pt returned from MRI.  Pt is AAOx3 but slow to respond.  No needs at this time.

## 2022-09-08 NOTE — ED Notes (Signed)
Patient transported to MRI. Patient having difficulty answering MRI questions. MRI transported patient back to ED stating "will have to contact a family member during the day or do a head to pelvis topogram CT scan to clear him thru imaging." MD Crosley made aware of this situation and states is okay to wait to contact sister in the morning in order to answer questions for MRI.

## 2022-09-08 NOTE — Progress Notes (Signed)
Carotid duplex has been completed.   Preliminary results in CV Proc.   Aundra Millet Isys Tietje 09/08/2022 4:18 PM

## 2022-09-09 ENCOUNTER — Inpatient Hospital Stay (HOSPITAL_COMMUNITY): Payer: Medicare Other

## 2022-09-09 DIAGNOSIS — I6310 Cerebral infarction due to embolism of unspecified precerebral artery: Secondary | ICD-10-CM | POA: Diagnosis not present

## 2022-09-09 DIAGNOSIS — I6522 Occlusion and stenosis of left carotid artery: Secondary | ICD-10-CM

## 2022-09-09 DIAGNOSIS — R296 Repeated falls: Secondary | ICD-10-CM

## 2022-09-09 DIAGNOSIS — R29898 Other symptoms and signs involving the musculoskeletal system: Secondary | ICD-10-CM | POA: Diagnosis not present

## 2022-09-09 LAB — BASIC METABOLIC PANEL
Anion gap: 10 (ref 5–15)
BUN: 7 mg/dL (ref 6–20)
CO2: 25 mmol/L (ref 22–32)
Calcium: 8.6 mg/dL — ABNORMAL LOW (ref 8.9–10.3)
Chloride: 104 mmol/L (ref 98–111)
Creatinine, Ser: 1.14 mg/dL (ref 0.61–1.24)
GFR, Estimated: >60 mL/min (ref >60)
Glucose, Bld: 80 mg/dL (ref 70–99)
Potassium: 3.6 mmol/L (ref 3.5–5.1)
Sodium: 139 mmol/L (ref 135–145)

## 2022-09-09 LAB — CBC
HCT: 36.7 % — ABNORMAL LOW (ref 39.0–52.0)
Hemoglobin: 12.8 g/dL — ABNORMAL LOW (ref 13.0–17.0)
MCH: 33.2 pg (ref 26.0–34.0)
MCHC: 34.9 g/dL (ref 30.0–36.0)
MCV: 95.1 fL (ref 80.0–100.0)
Platelets: 149 10*3/uL — ABNORMAL LOW (ref 150–400)
RBC: 3.86 MIL/uL — ABNORMAL LOW (ref 4.22–5.81)
RDW: 15.3 % (ref 11.5–15.5)
WBC: 7.9 10*3/uL (ref 4.0–10.5)
nRBC: 0 % (ref 0.0–0.2)

## 2022-09-09 LAB — SEDIMENTATION RATE: Sed Rate: 19 mm/hr — ABNORMAL HIGH (ref 0–16)

## 2022-09-09 LAB — RPR: RPR Ser Ql: NONREACTIVE

## 2022-09-09 LAB — VITAMIN B12: Vitamin B-12: 174 pg/mL — ABNORMAL LOW (ref 180–914)

## 2022-09-09 MED ORDER — CLOPIDOGREL BISULFATE 75 MG PO TABS
75.0000 mg | ORAL_TABLET | Freq: Every day | ORAL | Status: DC
  Administered 2022-09-09 – 2022-09-10 (×2): 75 mg via ORAL
  Filled 2022-09-09 (×2): qty 1

## 2022-09-09 MED ORDER — VITAMIN B-12 1000 MCG PO TABS
1000.0000 ug | ORAL_TABLET | Freq: Every day | ORAL | Status: DC
  Administered 2022-09-09 – 2022-09-10 (×2): 1000 ug via ORAL
  Filled 2022-09-09 (×2): qty 1

## 2022-09-09 MED ORDER — CYANOCOBALAMIN 1000 MCG/ML IJ SOLN
1000.0000 ug | Freq: Once | INTRAMUSCULAR | Status: AC
  Administered 2022-09-09: 1000 ug via INTRAMUSCULAR
  Filled 2022-09-09: qty 1

## 2022-09-09 MED ORDER — CLOPIDOGREL BISULFATE 300 MG PO TABS
300.0000 mg | ORAL_TABLET | Freq: Once | ORAL | Status: DC
  Filled 2022-09-09: qty 1

## 2022-09-09 MED ORDER — CLOPIDOGREL BISULFATE 75 MG PO TABS: 75.0000 mg | ORAL_TABLET | Freq: Every day | ORAL | Status: DC

## 2022-09-09 MED ORDER — THIAMINE HCL 100 MG/ML IJ SOLN
500.0000 mg | Freq: Three times a day (TID) | INTRAVENOUS | Status: DC
  Administered 2022-09-09 – 2022-09-10 (×3): 500 mg via INTRAVENOUS
  Filled 2022-09-09 (×6): qty 5

## 2022-09-09 MED ORDER — IOHEXOL 350 MG/ML SOLN
75.0000 mL | Freq: Once | INTRAVENOUS | Status: AC | PRN
  Administered 2022-09-09: 75 mL via INTRAVENOUS

## 2022-09-09 NOTE — Progress Notes (Addendum)
STROKE TEAM PROGRESS NOTE   INTERVAL HISTORY Patient is seen in his room with no family at the bedside.  He was admitted with frequent falls and gait disturbance and found to have multiple punctate strokes on brain MRI.  He has no history of atrial fibrillation with a negative evaluation with a TEE and loop recorder in the past. Strokes are cryptogenic. He presented with subacute decline in his gait balance as well as cognition and MRI scan shows multiple tiny punctate cortical and subcortical infarcts as well as significant progression of supratentorial bilateral white matter disease. Vitals:   09/08/22 2141 09/08/22 2314 09/09/22 0317 09/09/22 1148  BP: 92/69 91/72 (!) 84/61 105/78  Pulse: (!) 56 (!) 59 (!) 56 63  Resp: 16 16 16 18   Temp: 98 F (36.7 C) 98 F (36.7 C) 98 F (36.7 C) 97.6 F (36.4 C)  TempSrc: Oral Oral Oral Oral  SpO2: 100% 99% 100% 100%  Height:       CBC:  Recent Labs  Lab 09/07/22 2029 09/08/22 0300 09/09/22 0653  WBC 16.1* 10.7* 7.9  NEUTROABS 13.6*  --   --   HGB 12.4* 12.0* 12.8*  HCT 34.2* 33.7* 36.7*  MCV 93.4 94.7 95.1  PLT 172 129* 149*   Basic Metabolic Panel:  Recent Labs  Lab 09/08/22 0300 09/09/22 0653  NA 140 139  K 4.1 3.6  CL 104 104  CO2 26 25  GLUCOSE 94 80  BUN 6 7  CREATININE 1.18 1.14  CALCIUM 8.6* 8.6*   Lipid Panel:  Recent Labs  Lab 09/08/22 0300  CHOL 76  TRIG 55  HDL 30*  CHOLHDL 2.5  VLDL 11  LDLCALC 35   HgbA1c:  Recent Labs  Lab 09/08/22 0300  HGBA1C 5.6   Urine Drug Screen: No results for input(s): "LABOPIA", "COCAINSCRNUR", "LABBENZ", "AMPHETMU", "THCU", "LABBARB" in the last 168 hours.  Alcohol Level  Recent Labs  Lab 09/07/22 2029  ETH <10    IMAGING past 24 hours VAS 2030 CAROTID  Result Date: 09/09/2022 Carotid Arterial Duplex Study Patient Name:  GIBRIL MASTRO  Date of Exam:   09/08/2022 Medical Rec #: 13/06/2022          Accession #:    [id number] Date of Birth: September 16, 1966           Patient Gender: M Patient Age:   56 years Exam Location:  [facility] Procedure:      VAS [facility] CAROTID Referring Phys: [place] MEMON --------------------------------------------------------------------------------  Indications:       CVA. Risk Factors:      Prior CVA. Comparison Study:  04/12/17 prior Performing Technologist: 06/12/17 RVS  Examination Guidelines: A complete evaluation includes B-mode imaging, spectral Doppler, color Doppler, and power Doppler as needed of all accessible portions of each vessel. Bilateral testing is considered an integral part of a complete examination. Limited examinations for reoccurring indications may be performed as noted.  Right Carotid Findings: +----------+--------+--------+--------+------------------+--------+           PSV cm/sEDV cm/sStenosisPlaque DescriptionComments +----------+--------+--------+--------+------------------+--------+ CCA Prox  104     25              heterogenous               +----------+--------+--------+--------+------------------+--------+ CCA Distal98      27              heterogenous               +----------+--------+--------+--------+------------------+--------+ ICA  Prox  89      30      1-39%   heterogenous      tortuous +----------+--------+--------+--------+------------------+--------+ ICA Distal99      35                                         +----------+--------+--------+--------+------------------+--------+ ECA       76      17                                         +----------+--------+--------+--------+------------------+--------+ +----------+--------+-------+--------+-------------------+           PSV cm/sEDV cmsDescribeArm Pressure (mmHG) +----------+--------+-------+--------+-------------------+ [id number]                                         +----------+--------+-------+--------+-------------------+ +---------+--------+--+--------+--+---------+ VertebralPSV  cm/s41EDV cm/s13Antegrade +---------+--------+--+--------+--+---------+  Left Carotid Findings: +----------+--------+--------+--------+------------------+--------+           PSV cm/sEDV cm/sStenosisPlaque DescriptionComments +----------+--------+--------+--------+------------------+--------+ CCA Prox  71                      heterogenous               +----------+--------+--------+--------+------------------+--------+ CCA Distal81      14              heterogenous               +----------+--------+--------+--------+------------------+--------+ ICA Prox                  Occluded                           +----------+--------+--------+--------+------------------+--------+ ICA Mid                   Occluded                           +----------+--------+--------+--------+------------------+--------+ ICA Distal                Occluded                           +----------+--------+--------+--------+------------------+--------+ ECA       78      12                                         +----------+--------+--------+--------+------------------+--------+ +----------+--------+--------+--------+-------------------+           PSV cm/sEDV cm/sDescribeArm Pressure (mmHG) +----------+--------+--------+--------+-------------------+ [id number]                                          +----------+--------+--------+--------+-------------------+ +---------+--------+--+--------+--+---------+ VertebralPSV cm/s61EDV cm/s14Antegrade +---------+--------+--+--------+--+---------+   Summary: Right Carotid: Velocities in the right ICA are consistent with a 1-39% stenosis. Left Carotid: Evidence consistent with a total occlusion of the left ICA. Vertebrals: Bilateral vertebral arteries demonstrate antegrade flow. *See table(s) above for measurements and observations.  Electronically signed by Delia Heady  MD on 09/09/2022 at 1:39:14 PM.    Final    CT ANGIO HEAD  NECK W WO CM  Result Date: 09/09/2022 CLINICAL DATA:  Stroke/TIA, determine embolic source. EXAM: CT ANGIOGRAPHY HEAD AND NECK TECHNIQUE: Multidetector CT imaging of the head and neck was performed using the standard protocol during bolus administration of intravenous contrast. Multiplanar CT image reconstructions and MIPs were obtained to evaluate the vascular anatomy. Carotid stenosis measurements (when applicable) are obtained utilizing NASCET criteria, using the distal internal carotid diameter as the denominator. RADIATION DOSE REDUCTION: This exam was performed according to the departmental dose-optimization program which includes automated exposure control, adjustment of the mA and/or kV according to patient size and/or use of iterative reconstruction technique. CONTRAST:  73mL OMNIPAQUE IOHEXOL 350 MG/ML SOLN COMPARISON:  MRI of the brain September 08, 2022. FINDINGS: CT HEAD FINDINGS Brain: Extensive areas of encephalomalacia and gliosis involving both cerebral hemispheres watershed zones with sparing of deep gray nuclei, similar to seen on prior studies. Small acute infarcts in the bilateral cerebral hemispheres are better demonstrated on recent MRI. No acute hemorrhage, hydrocephalus, extra-axial collection or mass lesion. Vascular: No hyperdense vessel or unexpected calcification. Skull: Normal. Negative for fracture or focal lesion. Sinuses/Orbits: No acute finding. Other: None. Review of the MIP images confirms the above findings CTA NECK FINDINGS Aortic arch: Common origin of the innominate and left common carotid artery from the aortic arch. The left vertebral artery originates directly from the aortic arch. Imaged portion shows no evidence of aneurysm or dissection. No significant stenosis of the major arch vessel origins. Right carotid system: No evidence of dissection, stenosis (50% or greater), or occlusion. Left carotid system: Patent left common carotid artery and external carotid branches.  Occlusion of the left internal carotid artery at the bulb. Vertebral arteries: Codominant. No evidence of dissection, stenosis (50% or greater), or occlusion. Skeleton: Negative. Other neck: Subcentimeter incidental thyroid nodule. No follow-up imaging is recommended. Reference: J Am Coll Radiol. 2015 Feb;12(2): 143-50. Upper chest: Centrilobular emphysema. 8 mm left subpleural nodule, partially imaged. Per Fleischner Society Guidelines, recommend a non-contrast Chest CT at 6-12 months, then another non-contrast Chest CT at 18-24 months. These guidelines do not apply to immunocompromised patients and patients with cancer. Follow up in patients with significant comorbidities as clinically warranted. For lung cancer screening, adhere to Lung-RADS guidelines. Reference: Radiology. 2017; 284(1):228-43. Review of the MIP images confirms the above findings CTA HEAD FINDINGS Anterior circulation: Normal course and caliber of the intracranial right internal carotid artery. Occluded left internal carotid artery at the skull base with a constitution at the of the mix segment. The supraclinoid left ICA has diminutive caliber. Asymmetry of the core needle cerebral arteries with smaller caliber of the left M1/MCA. Prominent bilateral posterior communicating arteries. No focal or hemodynamically significant stenosis. The bilateral ACA vascular trees are maintained. Posterior circulation: No significant stenosis, proximal occlusion, aneurysm, or vascular malformation. Venous sinuses: As permitted by contrast timing, patent. Anatomic variants: Small A-comm fenestration. Review of the MIP images confirms the above findings IMPRESSION: 1. Occlusion of the left internal carotid artery at the bulb with reconstitution at the ophthalmic segment. 2. No high-grade intracranial stenosis or occlusion. 3. Extensive areas of encephalomalacia and gliosis involving both cerebral hemispheres watershed zones with sparing of the deep gray nuclei,  similar to prior studies. Small acute infarcts in the bilateral cerebral hemispheres are better demonstrated on recent MRI. 4. 8 mm left subpleural nodule, partially imaged. 5. Emphysema. Emphysema (ICD10-J43.9). Electronically Signed  By: Baldemar LenisKatyucia  de Macedo Rodrigues M.D.   On: 09/09/2022 10:28   ECHOCARDIOGRAM COMPLETE  Result Date: 09/08/2022    ECHOCARDIOGRAM REPORT   Patient Name:   Larina EarthlyMOTHY E Saxe Date of Exam: 09/08/2022 Medical Rec #:  [id number]         Height:       66.0 in Accession #:    [id number]        Weight:       123.0 lb Date of Birth:  1966/03/23         BSA:          1.627 m Patient Age:    56 years          BP:           97/68 mmHg Patient Gender: M                 HR:           79 bpm. Exam Location:  Inpatient Procedure: 2D Echo, Cardiac Doppler and Color Doppler Indications:    Stroke  History:        Patient has no prior history of Echocardiogram examinations.                 Stroke and PAD; Risk Factors:Dyslipidemia.  Sonographer:    Meagan Baucom RDCS, FE, PE Referring Phys: [id number] JEHANZEB MEMON IMPRESSIONS  1. Left ventricular ejection fraction, by estimation, is 55 to 60%. The left ventricle has normal function. The left ventricle has no regional wall motion abnormalities. Left ventricular diastolic parameters are consistent with Grade I diastolic dysfunction (impaired relaxation).  2. Right ventricular systolic function is normal. The right ventricular size is normal. There is normal pulmonary artery systolic pressure. The estimated right ventricular systolic pressure is 21.8 mmHg.  3. The mitral valve is normal in structure. No evidence of mitral valve regurgitation. No evidence of mitral stenosis.  4. The aortic valve is tricuspid. There is mild calcification of the aortic valve. Aortic valve regurgitation is mild. No aortic stenosis is present.  5. The inferior vena cava is normal in size with greater than 50% respiratory variability, suggesting right atrial pressure of 3 mmHg.  FINDINGS  Left Ventricle: Left ventricular ejection fraction, by estimation, is 55 to 60%. The left ventricle has normal function. The left ventricle has no regional wall motion abnormalities. The left ventricular internal cavity size was normal in size. There is  no left ventricular hypertrophy. Left ventricular diastolic parameters are consistent with Grade I diastolic dysfunction (impaired relaxation). Right Ventricle: The right ventricular size is normal. No increase in right ventricular wall thickness. Right ventricular systolic function is normal. There is normal pulmonary artery systolic pressure. The tricuspid regurgitant velocity is 2.17 m/s, and  with an assumed right atrial pressure of 3 mmHg, the estimated right ventricular systolic pressure is 21.8 mmHg. Left Atrium: Left atrial size was normal in size. Right Atrium: Right atrial size was normal in size. Pericardium: There is no evidence of pericardial effusion. Mitral Valve: The mitral valve is normal in structure. No evidence of mitral valve regurgitation. No evidence of mitral valve stenosis. Tricuspid Valve: The tricuspid valve is normal in structure. Tricuspid valve regurgitation is trivial. Aortic Valve: The aortic valve is tricuspid. There is mild calcification of the aortic valve. Aortic valve regurgitation is mild. No aortic stenosis is present. Pulmonic Valve: The pulmonic valve was normal in structure. Pulmonic valve regurgitation is not visualized. Aorta: The aortic root is normal in  size and structure. Venous: The inferior vena cava is normal in size with greater than 50% respiratory variability, suggesting right atrial pressure of 3 mmHg. IAS/Shunts: No atrial level shunt detected by color flow Doppler.  LEFT VENTRICLE PLAX 2D LVIDd:         3.90 cm   Diastology LVIDs:         2.60 cm   LV e' medial:    6.20 cm/s LV PW:         0.90 cm   LV E/e' medial:  13.0 LV IVS:        0.80 cm   LV e' lateral:   8.27 cm/s LVOT diam:     2.00 cm   LV  E/e' lateral: 9.7 LV SV:         56 LV SV Index:   34 LVOT Area:     3.14 cm  RIGHT VENTRICLE RV S prime:     13.70 cm/s TAPSE (M-mode): 1.1 cm LEFT ATRIUM             Index        RIGHT ATRIUM          Index LA diam:        2.10 cm 1.29 cm/m   RA Area:     9.60 cm LA Vol (A2C):   16.7 ml 10.27 ml/m  RA Volume:   18.80 ml 11.56 ml/m LA Vol (A4C):   24.6 ml 15.12 ml/m LA Biplane Vol: 21.9 ml 13.46 ml/m  AORTIC VALVE LVOT Vmax:   106.00 cm/s LVOT Vmean:  60.700 cm/s LVOT VTI:    0.177 m  AORTA Ao Root diam: 2.80 cm MITRAL VALVE               TRICUSPID VALVE MV Area (PHT): 2.99 cm    TR Peak grad:   18.8 mmHg MV Decel Time: 254 msec    TR Vmax:        217.00 cm/s MV E velocity: 80.60 cm/s MV A velocity: 85.30 cm/s  SHUNTS MV E/A ratio:  0.94        Systemic VTI:  0.18 m                            Systemic Diam: 2.00 cm Dalton McleanMD Electronically signed by Wilfred Lacy Signature Date/Time: 09/08/2022/6:07:00 PM    Final     PHYSICAL EXAM General:  Drowsy, thin appearing patient in no acute distress Respiratory:  Regular, unlabored respirations on room air  NEURO:  Mental Status: AA&Ox2 Speech/Language: speech is without dysarthria or aphasia.    Cranial Nerves:  II: PERRL.  III, IV, VI: EOMI. Eyelids elevate symmetrically.  V: Sensation is intact to light touch and symmetrical to face.  VII: Right sided facial droop  VIII: hearing intact to voice. IX, X: Palate elevates symmetrically. Phonation is normal.  UJ:[id number] shrug 5/5. XII: tongue is midline without fasciculations. Motor: 5/5 strength to LUE and LLE, 4/5 to RUE and RLE  Tone: is increased bilaterally and bulk is normal.  Deep tendon reflexes are exaggerated bilaterally sensation- Intact to light touch bilaterally.   Coordination: FTN intact bilaterally,  DTRs: hyperreflexic Gait- deferred   ASSESSMENT/PLAN Mr. SWANSON FARNELL is a 56 y.o. male with history of multiple strokes, frequent falls, tobacco use, left carotid  occlusion, HLD and PVD presenting with frequent falls and gait disturbance.  He was found to have multiple punctate strokes  on brain MRI.  He has no history of atrial fibrillation with a negative evaluation with a loop recorder in the past. Strokes are cryptogenic.  Stroke:  Multiple punctate strokes in bilateral hemispheres cortex and subcortical regions. Etiology:  cryptogenic  .  Progression of mostly supratentorial white matter changes raise concern for territory small vessel disease conditions ? CADASIL versu sMELAS doubt CNS vasculitis CT head Extensive areas of encephalomalacia and gliosis involving both hemispheres CTA head & neck occluded left ICA, no other high grade stenosis or occlusion MRI  scattered acute punctate infarcts in bilateral hemispheres with underlying encephalomalacia and advanced white matter disease Carotid Doppler  total occlusion of left ICA 1-39% stenosis in right ICA 2D Echo EF 55-60%, grade 1 diastolic dysfunction, no atrial level shunt LDL 35 HgbA1c 5.6 VTE prophylaxis - lovenox    Diet   Diet Heart Room service appropriate? Yes; Fluid consistency: Thin   aspirin 81 mg daily prior to admission, now on aspirin 81 mg daily and clopidogrel 75 mg daily.  Therapy recommendations:  SNF Disposition:  pending  Hypertension Home meds:  none Stable Permissive hypertension (OK if < 220/120) but gradually normalize in 5-7 days Long-term BP goal normotensive  Hyperlipidemia Home meds:  none LDL 35, goal < 70 High intensity statin not indicated as LDL below goal Continue statin at discharge  Cognitive impairment Evidence of cognitive impairment seen on exam SLP to perform full cognitive evlauation  Other Stroke Risk Factors Cigarette smoker advised to stop smoking Hx stroke  Other Active Problems none  Hospital day # 1  Cortney E Ernestina Columbia , MSN, AGACNP-BC [facility] See Amion for schedule and pager information 09/09/2022 3:31 PM    STROKE MD NOTE ; I have personally obtained history,examined this patient, reviewed notes, independently viewed imaging studies, participated in medical decision making and plan of care.ROS completed by me personally and pertinent positives fully documented  I have made any additions or clarifications directly to the above note. Agree with note above.  Patient presented with subacute gait difficulties with frequent falls and decreasing cognition and MRI scan shows bilateral scattered tiny punctate cortical and subcortical infarcts of cryptogenic etiology.  He has had previous strokes with extensive work-up in 2018 and interestingly MRI scan also shows progressive bilateral supratentorial symmetric white matter changes raising concern for hereditary vessel disease etiologies like CADASIL or MELAS Recommend check lab work for inflammatory conditions, vasculitis, genetic testing CADASIL or MELAS.  Greater 50% time during this 50-minute visit was spent on counseling and coordination of care about his strokes and discussion about evaluation and treatment and answering questions.  Discussed with Dr. Annitta Needs, MD Medical Director [facility] Pager: [phone] 09/09/2022 4:37 PM   To contact Stroke Continuity provider, please refer to [email or web address]. After hours, contact General Neurology

## 2022-09-09 NOTE — [place] FL2 (Signed)
[place] MEDICAID FL2 LEVEL OF CARE SCREENING TOOL     IDENTIFICATION  Patient Name: Daniel Stuart Birthdate: [birth year] Sex: male Admission Date (Current Location): 09/07/2022  [facility] and [place] Number:  Producer, television/film/video and Address:  [facility] [place], [address] [place], [place] [address]      Provider Number: [id number]  Attending Physician Name and Address:  Erick Blinks, MD  Relative Name and Phone Number:  [name]  [phone]    Current Level of Care: Hospital Recommended Level of Care: Skilled Nursing Facility Prior Approval Number:    Date Approved/Denied:   PASRR Number: [id number]  Discharge Plan: SNF    Current Diagnoses: Patient Active Problem List   Diagnosis Date Noted   Weakness of both lower extremities 09/07/2022   Impaired mobility 08/25/2022   PAD (peripheral artery disease) (HCC) 08/25/2022   Need for influenza vaccination 08/25/2022   Frequent falls 03/11/2020   Arrhythmia as indication for cardiac pacemaker replacement 03/11/2020   Urinary frequency 03/11/2020   Vitamin D deficiency 09/13/2019   S/P stroke due to cerebrovascular disease 03/12/2019   Right sided weakness 03/12/2019   Anxiety 03/12/2019   Hyperlipidemia    Tobacco abuse 04/12/2017   Dyslipidemia 04/12/2017   PVD (peripheral vascular disease) (HCC) 04/12/2017   Carotid arterial disease (HCC) 04/12/2017   CVA (cerebral vascular accident) (HCC) 04/11/2017    Orientation RESPIRATION BLADDER Height & Weight     Self, Place  Normal Incontinent, External catheter Weight:   Height:  5\' 6"  (167.6 cm)  BEHAVIORAL SYMPTOMS/MOOD NEUROLOGICAL BOWEL NUTRITION STATUS      Continent Diet (See DC summary)  AMBULATORY STATUS COMMUNICATION OF NEEDS Skin   Extensive Assist Verbally Normal                       Personal Care Assistance Level of Assistance  Bathing, Feeding, Dressing Bathing Assistance: Maximum assistance Feeding assistance:  Limited assistance Dressing Assistance: Maximum assistance     Functional Limitations Info  Sight, Hearing, Speech Sight Info: Adequate Hearing Info: Adequate Speech Info: Adequate    SPECIAL CARE FACTORS FREQUENCY  PT (By licensed PT), OT (By licensed OT)     PT Frequency: 5x week OT Frequency: 5x week            Contractures Contractures Info: Not present    Additional Factors Info  Code Status, Allergies Code Status Info: Full Allergies Info: NKA           Current Medications (09/09/2022):  This is the current hospital active medication list Current Facility-Administered Medications  Medication Dose Route Frequency Provider Last Rate Last Admin   acetaminophen (TYLENOL) tablet 650 mg  650 mg Oral Q4H PRN Crosley, Debby, MD       Or   acetaminophen (TYLENOL) 160 MG/5ML solution 650 mg  650 mg Per Tube Q4H PRN Crosley, Debby, MD       Or   acetaminophen (TYLENOL) suppository 650 mg  650 mg Rectal Q4H PRN Crosley, Debby, MD       aspirin EC tablet 81 mg  81 mg Oral Daily Crosley, Debby, MD   81 mg at 09/09/22 0908   atorvastatin (LIPITOR) tablet 80 mg  80 mg Oral Daily Crosley, Debby, MD   80 mg at 09/09/22 0908   cyanocobalamin (VITAMIN B12) tablet 1,000 mcg  1,000 mcg Oral Daily Bhagat, Srishti L, MD   1,000 mcg at 09/09/22 0908   enoxaparin (LOVENOX) injection  40 mg  40 mg Subcutaneous Q24H Crosley, Debby, MD   40 mg at 09/09/22 0908   gabapentin (NEURONTIN) capsule 300 mg  300 mg Oral BID Crosley, Debby, MD   300 mg at 09/09/22 0908   nicotine (NICODERM CQ - dosed in mg/24 hours) patch 21 mg  21 mg Transdermal Daily Crosley, Debby, MD   21 mg at 09/09/22 0909   oxybutynin (DITROPAN-XL) 24 hr tablet 10 mg  10 mg Oral QHS Crosley, Debby, MD   10 mg at 09/08/22 2222   senna-docusate (Senokot-S) tablet 1 tablet  1 tablet Oral QHS PRN Gery Pray, MD         Discharge Medications: Please see discharge summary for a list of discharge medications.  Relevant  Imaging Results:  Relevant Lab Results:   Additional Information SS# [id number] [place]

## 2022-09-09 NOTE — Consult Note (Addendum)
Neurology Consultation Reason for Consult: embolic strokes Requesting Physician: Erick Blinks   CC: Increasing frequency of falls  History is obtained from: Patient and chart review  HPI: Daniel Stuart is a 56 y.o. male with a past medical history significant for multifocal strokes, loop recorder placement (2018), frequent falls, ongoing tobacco abuse, chronic left carotid occlusion, hyperlipidemia, peripheral vascular disease, headaches  For the past year he has had gradually progressive decline of his gait and cognition with increasing falls.  MRI brain, C-spine, T-spine and L-spine were obtained and neurology was consulted for findings of multifocal punctate strokes on MRI brain  Prior 2021 cardiology note, on their review of loop recorder no arrhythmias captured  LKW: 11/7 morning Thrombolytic given?: No, out of the window IA performed?: No, no LVO Premorbid modified rankin scale:      3 - Moderate disability. Requires some help, but able to walk unassisted.   ROS: All other review of systems was negative except as noted in the HPI, with caveat of some confusion at the time of my evaluation at 5 AM.   Past Medical History:  Diagnosis Date   Anxiety    Arrhythmia as indication for cardiac pacemaker replacement 03/2020   Frequent falls 03/2020   History of loop recorder 04/2017   Stroke (cerebrum) (HCC)    Tobacco use    Urinary frequency 09/2019   Vitamin D deficiency    Past Surgical History:  Procedure Laterality Date   FOOT SURGERY     LOOP RECORDER INSERTION N/A 04/13/2017   Procedure: Loop Recorder Insertion;  Surgeon: Marinus Maw, MD;  Location: [facility];  Service: Cardiovascular;  Laterality: N/A;   TEE WITHOUT CARDIOVERSION N/A 04/13/2017   Procedure: TRANSESOPHAGEAL ECHOCARDIOGRAM (TEE);  Surgeon: Jake Bathe, MD;  Location: [facility] ENDOSCOPY;  Service: Cardiovascular;  Laterality: N/A;   Current Outpatient Medications  Medication Instructions    acetaminophen (TYLENOL) 1,000 mg, Oral, Every 6 hours PRN   aspirin EC 81 mg, Oral, Daily   atorvastatin (LIPITOR) 80 MG tablet TAKE 1 TABLET (80 MG TOTAL) BY MOUTH EVERY EVENING.   cholecalciferol (VITAMIN D3) 1,000 Units, Oral, Daily   gabapentin (NEURONTIN) 300 MG capsule TAKE 1 CAPSULE BY MOUTH BY MOUTH TWO TIMES DAILY   oxybutynin (DITROPAN-XL) 10 mg, Oral, Daily at bedtime     Family History  Problem Relation Age of Onset   Diabetes Mother    COPD Mother    Diabetes Father    Heart failure Father    Cancer Sister      Social History:  reports that he has been smoking cigarettes. He started smoking about 44 years ago. He has been smoking an average of 1 pack per day. He has never used smokeless tobacco. He reports that he does not drink alcohol and does not use drugs.   Exam: Current vital signs: BP (!) 84/61 (BP Location: Right Arm)   Pulse (!) 56   Temp 98 F (36.7 C) (Oral)   Resp 16   Ht 5\' 6"  (1.676 m)   SpO2 100%   BMI 19.85 kg/m  Vital signs in last 24 hours: Temp:  [98 F (36.7 C)-98.3 F (36.8 C)] 98 F (36.7 C) (11/09 0317) Pulse Rate:  [56-64] 56 (11/09 0317) Resp:  [14-16] 16 (11/09 0317) BP: (82-101)/(57-72) 84/61 (11/09 0317) SpO2:  [98 %-100 %] 100 % (11/09 0317)   Physical Exam  Constitutional: Appears thin Psych: Affect calm and cooperative, slightly flat Eyes: No scleral  injection HENT: No oropharyngeal obstruction.  MSK: no joint deformities.  Cardiovascular: Normal rate and regular rhythm. Perfusing extremities well Respiratory: Effort normal, non-labored breathing GI: Soft.  No distension. There is no tenderness.  Skin: Warm dry and intact visible skin, some chronic changes/dry skin on the bilateral shins left worse than right  Neuro: Mental Status: Patient is awake, alert, hypophonic, slow to respond, on initial awakening disoriented to month.  Unable to name some simple objects such as thumb and pinky (able to state finger), but  able to report them accurately based on choices. Patient gives limited history and reports only a few weeks of worsening weakness though clearly documented in the chart that this has been ongoing for months Some difficulty repeating sentences accurately.  Appears to have some neglect of the right side Significant difficulty on copying complex finger positions Cranial Nerves: II: More difficulty counting fingers on the right than the left but at times he is able to count correctly on the right side. Pupils, round, and reactive to light with likely physiologic anisocoria with the left pupil slightly larger than the right III,IV, VI: EOMI without ptosis or diploplia.  Very saccadic pursuits V: Facial sensation is symmetric to light touch VII: Facial movement is symmetric.  VIII: hearing is intact to voice X: Uvula elevates symmetrically XI: Shoulder shrug is symmetric. XII: tongue is midline without atrophy or fasciculations.  Motor: Tone is spastic throughout a little bit worse on the right than the left.  5/5 on confrontational strength testing.  Slight bilateral pronator drift of the bilateral upper extremities Sensory: Sensation is symmetric to light touch and temperature in the arms and legs. Deep Tendon Reflexes: 3+ and symmetric in the brachioradialis and patellae.  Cerebellar: FNF and HKS off target bilaterally, no clear tremor given very bradykinetic movement, though requires multiple repetitions and mimicking to correctly execute Gait:  Deferred   NIHSS total 10 Score breakdown: 2 points for not answering questions correctly, 2 points for right hemianopia, one-point for left upper extremity weakness, one-point for right upper extremity weakness, 2 points for limb ataxia in the bilateral upper extremities, one-point for moderate aphasia, one-point for neglect of the right side Performed at 5:30 AM    I have reviewed labs in epic and the results pertinent to this consultation  are:  Basic Metabolic Panel: Recent Labs  Lab 09/07/22 2140 09/08/22 0300  NA 142 140  K 3.5 4.1  CL 104 104  CO2 25 26  GLUCOSE 92 94  BUN 6 6  CREATININE 1.29* 1.18  CALCIUM 8.8* 8.6*    CBC: Recent Labs  Lab 09/07/22 2029 09/08/22 0300  WBC 16.1* 10.7*  NEUTROABS 13.6*  --   HGB 12.4* 12.0*  HCT 34.2* 33.7*  MCV 93.4 94.7  PLT 172 129*    Coagulation Studies: No results for input(s): "LABPROT", "INR" in the last 72 hours.   Lab Results  Component Value Date   CHOL 76 09/08/2022   HDL 30 (L) 09/08/2022   LDLCALC 35 09/08/2022   TRIG 55 09/08/2022   CHOLHDL 2.5 09/08/2022   Lab Results  Component Value Date   HGBA1C 5.6 09/08/2022   Lab Results  Component Value Date   VITAMINB12 287 06/11/2020     I have reviewed the images obtained:  MRI brain personally reviewed, agree with radiology:   1. Scattered punctate acute cortical and subcortical infarcts in both cerebral hemispheres. No associated hemorrhage or mass effect. 2. Severe underlying hemispheric encephalomalacia, with associated  advanced white matter disease. There is associated corpus callosum atrophy. Notably, the bilateral deep gray nuclei and posterior fossa are spared. But otherwise but since 2018 progression of this phenomena does seem to reflect an ischemic etiology. And there is known chronic occlusion of the Left ICA. Other etiologies to consider include: MELAS (mitochondrial encephalomyopathy with lactic acidosis and stroke-like episodes), CADASIL (cerebral autosomal dominant arteriopathy with subcortical infarcts and leukoencephalopathy), CNS vasculitis and other small-vessel diseases.  MRI C-spine/T-spine-L-spine personally reviewed, agree with radiology:   1. Positive for a small solitary area of abnormal spinal cord signal which seems to be central right hemicord myelomalacia near the C4-C5 disc space.  2. Cervical spine degeneration, but capacious spinal canal  without spinal stenosis. Degenerative neural foraminal stenosis is occasionally moderate or severe. T and L spine negative for acute process    Cartoid [place] Right Carotid: Velocities in the right ICA are consistent with a 1-39%  stenosis.  Left Carotid: Evidence consistent with a total occlusion of the left ICA.  Vertebrals: Bilateral vertebral arteries demonstrate antegrade flow.   ECHO 09/08/22  1. Left ventricular ejection fraction, by estimation, is 55 to 60%. The  left ventricle has normal function. The left ventricle has no regional  wall motion abnormalities. Left ventricular diastolic parameters are  consistent with Grade I diastolic  dysfunction (impaired relaxation).   2. Right ventricular systolic function is normal. The right ventricular  size is normal. There is normal pulmonary artery systolic pressure. The  estimated right ventricular systolic pressure is 21.8 mmHg.   3. The mitral valve is normal in structure. No evidence of mitral valve  regurgitation. No evidence of mitral stenosis.   4. The aortic valve is tricuspid. There is mild calcification of the  aortic valve. Aortic valve regurgitation is mild. No aortic stenosis is  present.   5. The inferior vena cava is normal in size with greater than 50%  respiratory variability, suggesting right atrial pressure of 3 mmHg.   Impression: Patient with profound white matter disease, which is longstanding since at least mid 2018 although it has been progressing.  In this setting his exam is notable for parkinsonism, spasticity, psychomotor slowing, word finding difficulty.  Some of these features may be exacerbated by the timing of my exam in the early hours of the morning after waking him.  After his last documented strokes showing a central embolic pattern (2018) he did receive extensive neurological work-up including loop recorder and TEE; loop recorder is likely no longer functional given the time since insertion.  I do  suspect that he has some dementia as well due to white matter changes.  I suspect that his multifocal strokes are in the setting of his chronic small vessel risk factors especially given that his left carotid is chronically occluded and he continues to smoke.  However I defer to stroke team for recommendation on potentially completing TEE and whether there is any utility to further monitoring for arrhythmias given prior extended monitoring which was negative  Recommendations: - RPR for workup of potential syphilis, HIV already noted to be negative - b12, mma levels, given the low normal level in 2021 this may be contributing to his neurocognitive decline - empric b12 1000 mcg daily, may be stopped if B12 results > 600 (if borderline, please wait for MMA to result as normal before discontinuing B12) - Additionally check B1 (ordered level), please empirically supplement once level is drawn - Continue aspirin 81 mg daily, - Hold off on Plavix  at this time, consider LP for workup of vasculitis pending further vessel imaging - CTA head and neck for better characterization of intracranial vasculature and aortic arch atherosclerosis which can also be a source of central embolic phenomena - Please confirm no arrhythmias captured on prior loop recorder, I am unable to personally verify reports - Consider repeat TEE, defer to Dr. Tonia Ghent MD-PhD [facility] [phone] Available 7 PM to 7 AM, outside of these hours please call Neurologist on call as listed on Amion.

## 2022-09-09 NOTE — TOC Initial Note (Signed)
Transition of Care [facility]) - Initial/Assessment Note    Patient Details  Name: Daniel Stuart MRN: [id number] Date of Birth: [birth year]  Transition of [facility] CM/SW Contact:    Carley Hammed, LCSWA Phone Number: 09/09/2022, 1:31 PM  Clinical Narrative:                 CSW noted pt is oriented x2, and followed up with sister who assists with his care. Sister, Daniel Stuart, states pt lives alone, but she does his grocery shopping, takes him to the dr. And checks in on him. She confirms pt has been declining over the last several years. Pt had a similar episode in 2018, but was able to DC home and went to rehab without walls, until recently. Pt has never been to SNF, preference for pt to remain in [place], [facility] mentioned, as their mother had been there. Sister states she and pt had discussed ALF and pt is agreeable if it is needed after SNF, for now, plan is to return home with additional supports in place, after SNF. CSW to complete FL2 and will fax out referral's, Medicare. Gov info provided to sister. TOC will continue to follow for DC needs.  Expected Discharge Plan: Skilled Nursing Facility Barriers to Discharge: Continued Medical Work up, SNF Pending bed offer   Patient Goals and CMS Choice Patient states their goals for this hospitalization and ongoing recovery are:: Pt is disoriented and unable to participate in goal setting. CMS Medicare.gov Compare Post Acute Care list provided to:: Patient Represenative (must comment) Choice offered to / list presented to : Sibling  Expected Discharge Plan and Services Expected Discharge Plan: Skilled Nursing Facility     Post Acute Care Choice: Skilled Nursing Facility Living arrangements for the past 2 months: Single Family Home                                      Prior Living Arrangements/Services Living arrangements for the past 2 months: Single Family Home Lives with:: Self Patient language and need for interpreter  reviewed:: Yes Do you feel safe going back to the place where you live?: Yes      Need for Family Participation in Patient Care: Yes (Comment) Care giver support system in place?: Yes (comment)   Criminal Activity/Legal Involvement Pertinent to Current Situation/Hospitalization: No - Comment as needed  Activities of Daily Living      Permission Sought/Granted Permission sought to share information with : Family Supports Permission granted to share information with : Yes, Verbal Permission Granted  Share Information with NAME: Daniel Stuart     Permission granted to share info w Relationship: Sister  Permission granted to share info w Contact Information: [phone]  Emotional Assessment Appearance:: Appears stated age Attitude/Demeanor/Rapport: Lethargic Affect (typically observed): Flat Orientation: : Oriented to Self, Oriented to Place Alcohol / Substance Use: Not Applicable Psych Involvement: No (comment)  Admission diagnosis:  CVA (cerebral vascular accident) (HCC) [I63.9] History of CVA (cerebrovascular accident) [Z86.73] Leg weakness, bilateral [R29.898] Fall, initial encounter [W19.XXXA] Weakness of both lower extremities [R29.898] Patient Active Problem List   Diagnosis Date Noted   Weakness of both lower extremities 09/07/2022   Impaired mobility 08/25/2022   PAD (peripheral artery disease) (HCC) 08/25/2022   Need for influenza vaccination 08/25/2022   Frequent falls 03/11/2020   Arrhythmia as indication for cardiac pacemaker replacement 03/11/2020   Urinary frequency 03/11/2020   Vitamin  D deficiency 09/13/2019   S/P stroke due to cerebrovascular disease 03/12/2019   Right sided weakness 03/12/2019   Anxiety 03/12/2019   Hyperlipidemia    Tobacco abuse 04/12/2017   Dyslipidemia 04/12/2017   PVD (peripheral vascular disease) (HCC) 04/12/2017   Carotid arterial disease (HCC) 04/12/2017   CVA (cerebral vascular accident) (HCC) 04/11/2017   PCP:  Donell Beers, [name] Pharmacy:   [facility] - [facility] [address] [place] [address] Phone: [phone] Fax: [phone]  [facility] ([facility]) - [address] [place], [place] - [address] [place] [address] Phone: [phone] Fax: [phone]     Social Determinants of Health (SDOH) Interventions    Readmission Risk Interventions     No data to display

## 2022-09-09 NOTE — Progress Notes (Signed)
PROGRESS NOTE    Daniel Stuart  EAV:[id number] DOB: 1966-01-03 DOA: 09/07/2022 PCP: Donell Beers, [name]    Brief Narrative:  56 year old male who normally lives independently, prior history of stroke, presents to the hospital with worsening bilateral lower extremity weakness and recurrent falls.  Work-up in the emergency room with MRI does show bilateral acute cortical infarcts.  He is being admitted for further evaluation.   Assessment & Plan:   Principal Problem:   Weakness of both lower extremities Active Problems:   CVA (cerebral vascular accident) (HCC)   Tobacco abuse   Dyslipidemia   PVD (peripheral vascular disease) (HCC)   Frequent falls   Impaired mobility   PAD (peripheral artery disease) (HCC)   Acute CVA -MRI brain shows bilateral cortical infarcts -Currently on DAPT with aspirin and Plavix -He is already on high intensity statin, LDL 35 -A1c 5.6 -PT/OT evaluation with recommendations for skilled nursing facility -He passed bedside swallow screen -Echocardiogram unremarkable -Carotid Dopplers with chronic left ICA occlusion -CTA of head and neck also shows chronic left ICA occlusion with no other LVO -Appreciate neurology input -Further work-up for vasculitis including sed rate, ANCA, ANA, Lyme titers, antiphospholipid panel.  HIV/RPR negative.  B12 deficiency -B12 level 174 -Started on replacement therapy -B1 level pending, empirically started replacement  Frequent falls -Has baseline bilateral lower extremity weakness and right sided weakness from prior stroke -Normally ambulates with a rollator -PT/OT with recommendations for skilled nursing facility  Hyperlipidemia -Continue statin  Leukocytosis -Afebrile, no signs of infection -Chest x-ray without pneumonia -UA has been ordered -Possibly related to dehydration -WBC count has normalized with IV fluids      DVT prophylaxis: enoxaparin (LOVENOX) injection 40 mg Start: 09/08/22  1000 SCD's Start: 09/08/22 0049  Code Status: Full code Family Communication: updated sister over the phone Disposition Plan: Status is: Inpatient The patient will require care spanning > 2 midnights and should be moved to inpatient because: Continued work-up for stroke and inpatient neurology evaluation     Consultants:  Neurology  Procedures:    Antimicrobials:      Subjective: Patient is seen in his room, sitting up in bed.  He denies any complaints at this time.  Objective: Vitals:   09/08/22 2314 09/09/22 0317 09/09/22 1148 09/09/22 1610  BP: 91/72 (!) 84/61 105/78 94/78  Pulse: (!) 59 (!) 56 63 72  Resp: Temp:  98 F (36.7 C) 97.6 F (36.4 C) 98.1 F (36.7 C)  TempSrc: Oral Oral Oral Oral  SpO2: 99% 100% 100% 100%  Height:        Intake/Output Summary (Last 24 hours) at 09/09/2022 1819 Last data filed at 09/09/2022 1209 Gross per 24 hour  Intake 120 ml  Output 800 ml  Net -680 ml   There were no vitals filed for this visit.  Examination:  General exam: Appears calm and comfortable  Respiratory system: Clear to auscultation. Respiratory effort normal. Cardiovascular system: S1 & S2 heard, RRR. No JVD, murmurs, rubs, gallops or clicks. No pedal edema. Gastrointestinal system: Abdomen is nondistended, soft and nontender. No organomegaly or masses felt. Normal bowel sounds heard. Central nervous system: Strength is 4-5 on right side, 5 out of 5 on left side. Extremities: no edema bilaterally Skin: No rashes, lesions or ulcers Psychiatry: Pleasant, he is appropriate and his answers    Data Reviewed: I have personally reviewed following labs and imaging studies  CBC: Recent Labs  Lab 09/07/22 2029 09/08/22 0300  09/09/22 0653  WBC 16.1* 10.7* 7.9  NEUTROABS 13.6*  --   --   HGB 12.4* 12.0* 12.8*  HCT 34.2* 33.7* 36.7*  MCV 93.4 94.7 95.1  PLT 172 129* 149*   Basic Metabolic Panel: Recent Labs  Lab 09/07/22 2140 09/08/22 0300  09/09/22 0653  NA 142 140 139  K 3.5 4.1 3.6  CL 104 104 104  CO2 25 26 25   GLUCOSE 92 94 80  BUN 6 6 7   CREATININE 1.29* 1.18 1.14  CALCIUM 8.8* 8.6* 8.6*   GFR: CrCl cannot be calculated (Unknown ideal weight.). Liver Function Tests: Recent Labs  Lab 09/07/22 2140 09/08/22 0300  AST 24 35  ALT 17 16  ALKPHOS 74 74  BILITOT 1.2 1.5*  PROT 6.3* 6.1*  ALBUMIN 3.2* 3.1*   No results for input(s): "LIPASE", "AMYLASE" in the last 168 hours. No results for input(s): "AMMONIA" in the last 168 hours. Coagulation Profile: No results for input(s): "INR", "PROTIME" in the last 168 hours. Cardiac Enzymes: No results for input(s): "CKTOTAL", "CKMB", "CKMBINDEX", "TROPONINI" in the last 168 hours. BNP (last 3 results) No results for input(s): "PROBNP" in the last 8760 hours. HbA1C: Recent Labs    09/08/22 0300  HGBA1C 5.6   CBG: No results for input(s): "GLUCAP" in the last 168 hours. Lipid Profile: Recent Labs    09/08/22 0300  CHOL 76  HDL 30*  LDLCALC 35  TRIG 55  CHOLHDL 2.5   Thyroid Function Tests: No results for input(s): "TSH", "T4TOTAL", "FREET4", "T3FREE", "THYROIDAB" in the last 72 hours. Anemia Panel: Recent Labs    09/09/22 0653  VITAMINB12 174*   Sepsis Labs: No results for input(s): "PROCALCITON", "LATICACIDVEN" in the last 168 hours.  No results found for this or any previous visit (from the past 240 hour(s)).       Radiology Studies: VAS US CAROTID  Result Date: 09/09/2022 Carotid Arterial Duplex Study Patient Name:  Daniel Stuart  Date of Exam:   09/08/2022 Medical Rec #: [id number]          Accession #:    [id number] Date of Birth: 1965/11/09          Patient Gender: M Patient Age:   [age] years [facility] Procedure:      VAS US CAROTID Referring Phys: Durward MallardJEHANZEB [place] --------------------------------------------------------------------------------  Indications:       CVA. Risk Factors:      Prior CVA. Comparison Study:   04/12/17 prior Performing Technologist: [place] PonderMegan Stricklin RVS  Examination Guidelines: A complete evaluation includes B-mode imaging, spectral Doppler, color Doppler, and power Doppler as needed of all accessible portions of each vessel. Bilateral testing is considered an integral part of a complete examination. Limited examinations for reoccurring indications may be performed as noted.  Right Carotid Findings: +----------+--------+--------+--------+------------------+--------+           PSV cm/sEDV cm/sStenosisPlaque DescriptionComments +----------+--------+--------+--------+------------------+--------+ CCA Prox  104     25              heterogenous               +----------+--------+--------+--------+------------------+--------+ CCA Distal98      27              heterogenous               +----------+--------+--------+--------+------------------+--------+ ICA Prox  89      30      1-39%   heterogenous      tortuous +----------+--------+--------+--------+------------------+--------+  ICA Distal99      35                                         +----------+--------+--------+--------+------------------+--------+ ECA       76      17                                         +----------+--------+--------+--------+------------------+--------+ +----------+--------+-------+--------+-------------------+           PSV cm/sEDV cmsDescribeArm Pressure (mmHG) +----------+--------+-------+--------+-------------------+ [id number]                                         +----------+--------+-------+--------+-------------------+ +---------+--------+--+--------+--+---------+ VertebralPSV cm/s41EDV cm/s13Antegrade +---------+--------+--+--------+--+---------+  Left Carotid Findings: +----------+--------+--------+--------+------------------+--------+           PSV cm/sEDV cm/sStenosisPlaque DescriptionComments  +----------+--------+--------+--------+------------------+--------+ CCA Prox  71                      heterogenous               +----------+--------+--------+--------+------------------+--------+ CCA Distal81      14              heterogenous               +----------+--------+--------+--------+------------------+--------+ ICA Prox                  Occluded                           +----------+--------+--------+--------+------------------+--------+ ICA Mid                   Occluded                           +----------+--------+--------+--------+------------------+--------+ ICA Distal                Occluded                           +----------+--------+--------+--------+------------------+--------+ ECA       78      12                                         +----------+--------+--------+--------+------------------+--------+ +----------+--------+--------+--------+-------------------+           PSV cm/sEDV cm/sDescribeArm Pressure (mmHG) +----------+--------+--------+--------+-------------------+ [id number]                                          +----------+--------+--------+--------+-------------------+ +---------+--------+--+--------+--+---------+ VertebralPSV cm/s61EDV cm/s14Antegrade +---------+--------+--+--------+--+---------+   Summary: Right Carotid: Velocities in the right ICA are consistent with a 1-39% stenosis. Left Carotid: Evidence consistent with a total occlusion of the left ICA. Vertebrals: Bilateral vertebral arteries demonstrate antegrade flow. *See table(s) above for measurements and observations.  Electronically signed by Delia Heady MD on 09/09/2022 at 1:39:14 PM.    Final    CT ANGIO HEAD NECK W WO CM  Result Date: 09/09/2022 CLINICAL  DATA:  Stroke/TIA, determine embolic source. EXAM: CT ANGIOGRAPHY HEAD AND NECK TECHNIQUE: Multidetector CT imaging of the head and neck was performed using the standard protocol during  bolus administration of intravenous contrast. Multiplanar CT image reconstructions and MIPs were obtained to evaluate the vascular anatomy. Carotid stenosis measurements (when applicable) are obtained utilizing NASCET criteria, using the distal internal carotid diameter as the denominator. RADIATION DOSE REDUCTION: This exam was performed according to the departmental dose-optimization program which includes automated exposure control, adjustment of the mA and/or kV according to patient size and/or use of iterative reconstruction technique. CONTRAST:  51mL OMNIPAQUE IOHEXOL 350 MG/ML SOLN COMPARISON:  MRI of the brain September 08, 2022. FINDINGS: CT HEAD FINDINGS Brain: Extensive areas of encephalomalacia and gliosis involving both cerebral hemispheres watershed zones with sparing of deep gray nuclei, similar to seen on prior studies. Small acute infarcts in the bilateral cerebral hemispheres are better demonstrated on recent MRI. No acute hemorrhage, hydrocephalus, extra-axial collection or mass lesion. Vascular: No hyperdense vessel or unexpected calcification. Skull: Normal. Negative for fracture or focal lesion. Sinuses/Orbits: No acute finding. Other: None. Review of the MIP images confirms the above findings CTA NECK FINDINGS Aortic arch: Common origin of the innominate and left common carotid artery from the aortic arch. The left vertebral artery originates directly from the aortic arch. Imaged portion shows no evidence of aneurysm or dissection. No significant stenosis of the major arch vessel origins. Right carotid system: No evidence of dissection, stenosis (50% or greater), or occlusion. Left carotid system: Patent left common carotid artery and external carotid branches. Occlusion of the left internal carotid artery at the bulb. Vertebral arteries: Codominant. No evidence of dissection, stenosis (50% or greater), or occlusion. Skeleton: Negative. Other neck: Subcentimeter incidental thyroid nodule. No  follow-up imaging is recommended. Reference: J Am Coll Radiol. 2015 Feb;12(2): 143-50. Upper chest: Centrilobular emphysema. 8 mm left subpleural nodule, partially imaged. Per Fleischner Society Guidelines, recommend a non-contrast Chest CT at 6-12 months, then another non-contrast Chest CT at 18-24 months. These guidelines do not apply to immunocompromised patients and patients with cancer. Follow up in patients with significant comorbidities as clinically warranted. For lung cancer screening, adhere to Lung-RADS guidelines. Reference: Radiology. 2017; 284(1):228-43. Review of the MIP images confirms the above findings CTA HEAD FINDINGS Anterior circulation: Normal course and caliber of the intracranial right internal carotid artery. Occluded left internal carotid artery at the skull base with a constitution at the of the mix segment. The supraclinoid left ICA has diminutive caliber. Asymmetry of the core needle cerebral arteries with smaller caliber of the left M1/MCA. Prominent bilateral posterior communicating arteries. No focal or hemodynamically significant stenosis. The bilateral ACA vascular trees are maintained. Posterior circulation: No significant stenosis, proximal occlusion, aneurysm, or vascular malformation. Venous sinuses: As permitted by contrast timing, patent. Anatomic variants: Small A-comm fenestration. Review of the MIP images confirms the above findings IMPRESSION: 1. Occlusion of the left internal carotid artery at the bulb with reconstitution at the ophthalmic segment. 2. No high-grade intracranial stenosis or occlusion. 3. Extensive areas of encephalomalacia and gliosis involving both cerebral hemispheres watershed zones with sparing of the deep gray nuclei, similar to prior studies. Small acute infarcts in the bilateral cerebral hemispheres are better demonstrated on recent MRI. 4. 8 mm left subpleural nodule, partially imaged. 5. Emphysema. Emphysema (ICD10-J43.9). Electronically Signed    By: Baldemar Lenis M.D.   On: 09/09/2022 10:28   ECHOCARDIOGRAM COMPLETE  Result Date: 09/08/2022    ECHOCARDIOGRAM  REPORT   Patient Name:   KASHON KRAYNAK Date of Exam: 09/08/2022 Medical Rec #:  [id number]         Height:       66.0 in Accession #:    [id number]        Weight:       123.0 lb Date of Birth:  04-Apr-1966         BSA:          1.627 m Patient Age:    56 years          BP:           97/68 mmHg Patient Gender: M                 HR:           79 bpm. Exam Location:  Inpatient Procedure: 2D Echo, Cardiac Doppler and Color Doppler Indications:    Stroke  History:        Patient has no prior history of Echocardiogram examinations.                 Stroke and PAD; Risk Factors:Dyslipidemia.  Sonographer:    Meagan Baucom RDCS, FE, PE Referring Phys: [id number] Starr Urias IMPRESSIONS  1. Left ventricular ejection fraction, by estimation, is 55 to 60%. The left ventricle has normal function. The left ventricle has no regional wall motion abnormalities. Left ventricular diastolic parameters are consistent with Grade I diastolic dysfunction (impaired relaxation).  2. Right ventricular systolic function is normal. The right ventricular size is normal. There is normal pulmonary artery systolic pressure. The estimated right ventricular systolic pressure is 21.8 mmHg.  3. The mitral valve is normal in structure. No evidence of mitral valve regurgitation. No evidence of mitral stenosis.  4. The aortic valve is tricuspid. There is mild calcification of the aortic valve. Aortic valve regurgitation is mild. No aortic stenosis is present.  5. The inferior vena cava is normal in size with greater than 50% respiratory variability, suggesting right atrial pressure of 3 mmHg. FINDINGS  Left Ventricle: Left ventricular ejection fraction, by estimation, is 55 to 60%. The left ventricle has normal function. The left ventricle has no regional wall motion abnormalities. The left ventricular internal cavity size  was normal in size. There is  no left ventricular hypertrophy. Left ventricular diastolic parameters are consistent with Grade I diastolic dysfunction (impaired relaxation). Right Ventricle: The right ventricular size is normal. No increase in right ventricular wall thickness. Right ventricular systolic function is normal. There is normal pulmonary artery systolic pressure. The tricuspid regurgitant velocity is 2.17 m/s, and  with an assumed right atrial pressure of 3 mmHg, the estimated right ventricular systolic pressure is 21.8 mmHg. Left Atrium: Left atrial size was normal in size. Right Atrium: Right atrial size was normal in size. Pericardium: There is no evidence of pericardial effusion. Mitral Valve: The mitral valve is normal in structure. No evidence of mitral valve regurgitation. No evidence of mitral valve stenosis. Tricuspid Valve: The tricuspid valve is normal in structure. Tricuspid valve regurgitation is trivial. Aortic Valve: The aortic valve is tricuspid. There is mild calcification of the aortic valve. Aortic valve regurgitation is mild. No aortic stenosis is present. Pulmonic Valve: The pulmonic valve was normal in structure. Pulmonic valve regurgitation is not visualized. Aorta: The aortic root is normal in size and structure. Venous: The inferior vena cava is normal in size with greater than 50% respiratory variability, suggesting right atrial pressure of 3  mmHg. IAS/Shunts: No atrial level shunt detected by color flow Doppler.  LEFT VENTRICLE PLAX 2D LVIDd:         3.90 cm   Diastology LVIDs:         2.60 cm   LV e' medial:    6.20 cm/s LV PW:         0.90 cm   LV E/e' medial:  13.0 LV IVS:        0.80 cm   LV e' lateral:   8.27 cm/s LVOT diam:     2.00 cm   LV E/e' lateral: 9.7 LV SV:         56 LV SV Index:   34 LVOT Area:     3.14 cm  RIGHT VENTRICLE RV S prime:     13.70 cm/s TAPSE (M-mode): 1.1 cm LEFT ATRIUM             Index        RIGHT ATRIUM          Index LA diam:        2.10 cm  1.29 cm/m   RA Area:     9.60 cm LA Vol (A2C):   16.7 ml 10.27 ml/m  RA Volume:   18.80 ml 11.56 ml/m LA Vol (A4C):   24.6 ml 15.12 ml/m LA Biplane Vol: 21.9 ml 13.46 ml/m  AORTIC VALVE LVOT Vmax:   106.00 cm/s LVOT Vmean:  60.700 cm/s LVOT VTI:    0.177 m  AORTA Ao Root diam: 2.80 cm MITRAL VALVE               TRICUSPID VALVE MV Area (PHT): 2.99 cm    TR Peak grad:   18.8 mmHg MV Decel Time: 254 msec    TR Vmax:        217.00 cm/s MV E velocity: 80.60 cm/s MV A velocity: 85.30 cm/s  SHUNTS MV E/A ratio:  0.94        Systemic VTI:  0.18 m                            Systemic Diam: 2.00 cm Dalton McleanMD Electronically signed by Wilfred Lacy Signature Date/Time: 09/08/2022/6:07:00 PM    Final    MR Lumbar Spine W Wo Contrast  Result Date: 09/08/2022 CLINICAL DATA:  56 year old male with history of cardiac arrhythmia, left carotid occlusion, chronic cerebral ischemia. Worsening gait instability with frequent falls. Progressive lower extremity weakness. EXAM: MRI LUMBAR SPINE WITHOUT AND WITH CONTRAST TECHNIQUE: Multiplanar and multiecho pulse sequences of the lumbar spine were obtained without and with intravenous contrast. CONTRAST:  4mL GADAVIST GADOBUTROL 1 MMOL/ML IV SOLN in conjunction with contrast enhanced imaging of the brain, cervical and thoracic spine reported separately. COMPARISON:  Brain, cervical and thoracic MRI today reported separately. FINDINGS: Segmentation: Appears to be normal and concordant with the thoracic MRI numbering today. Alignment:  Normal aside from mild straightening of lumbar lordosis. Vertebrae: No marrow edema or evidence of acute osseous abnormality. Visualized bone marrow signal is within normal limits. Intact visible sacrum and SI joints. Conus medullaris and cauda equina: Conus extends to the L1 level. No lower spinal cord or conus signal abnormality. Capacious spinal canal. Cauda equina nerve roots appear normal, with no nerve root thickening or abnormal  enhancement. Paraspinal and other soft tissues: Negative. Disc levels: Essentially normal for age. Disc desiccation and disc bulging most pronounced at L5-S1. No spinal stenosis. Mild  to moderate multifactorial bilateral L5 neural foraminal stenosis. IMPRESSION: Normal for age MRI appearance of the lumbar spine, with capacious spinal canal and normal cauda equina nerve roots. Electronically Signed   By: Odessa Fleming M.D.   On: 09/08/2022 10:52   MR THORACIC SPINE W WO CONTRAST  Result Date: 09/08/2022 CLINICAL DATA:  56 year old male with history of cardiac arrhythmia, left carotid occlusion, chronic cerebral ischemia. Worsening gait instability with frequent falls. Progressive lower extremity weakness. EXAM: MRI THORACIC WITHOUT AND WITH CONTRAST TECHNIQUE: Multiplanar and multiecho pulse sequences of the thoracic spine were obtained without and with intravenous contrast. CONTRAST:  5mL GADAVIST GADOBUTROL 1 MMOL/ML IV SOLN in conjunction with contrast enhanced imaging of the brain and cervical spine reported separately. COMPARISON:  None Available. FINDINGS: Limited cervical spine imaging:  Reported separately today. Thoracic spine segmentation:  Appears to be normal. Alignment: Relatively normal thoracic kyphosis. No significant scoliosis or spondylolisthesis. Vertebrae: Visualized bone marrow signal is within normal limits. No marrow edema or evidence of acute osseous abnormality. Cord: Capacious thoracic spinal canal throughout. No thoracic cord signal abnormality or volume loss identified. No abnormal intradural enhancement or dural thickening. Conus medullaris appears to be normal at T12-L1. Paraspinal and other soft tissues: Negative. Disc levels: Normal for age. No significant degenerative disease or neural impingement identified. IMPRESSION: Negative MRI appearance of the thoracic spine and spinal cord. Electronically Signed   By: Odessa Fleming M.D.   On: 09/08/2022 10:49   MR Cervical Spine W or Wo  Contrast  Result Date: 09/08/2022 CLINICAL DATA:  56 year old male with history of cardiac arrhythmia, left carotid occlusion, chronic cerebral ischemia. Worsening gait instability with frequent falls. Progressive lower extremity weakness. EXAM: MRI CERVICAL SPINE WITHOUT AND WITH CONTRAST TECHNIQUE: Multiplanar and multiecho pulse sequences of the cervical spine, to include the craniocervical junction and cervicothoracic junction, were obtained without and with intravenous contrast. CONTRAST:  5mL GADAVIST GADOBUTROL 1 MMOL/ML IV SOLN in conjunction with contrast enhanced imaging of the brain reported separately. COMPARISON:  Brain MRI today reported separately. FINDINGS: Alignment: Straightening of cervical lordosis. No significant scoliosis or spondylolisthesis. Vertebrae: No marrow edema or evidence of acute osseous abnormality. Visualized bone marrow signal is within normal limits. Cord: Sagittal T2 and STIR images suggest a small abnormal focus of increased signal within the substance of the spinal cord just above the C4-C5 disc (series 13, image 8) with no associated cord expansion or enhancement. And this lesion is correlated on axial T2* as being in the central gray matter in the right hemicord series 16, image 12. There is subtle right hemi cord volume loss there. Elsewhere spinal cord signal and morphology appear within normal limits. No abnormal intradural enhancement or dural thickening. Posterior Fossa, vertebral arteries, paraspinal tissues: Cervicomedullary junction is within normal limits. There is loss of the normal left ICA flow void in the neck corresponding to known chronic occlusion. Other major vascular flow voids in the neck are preserved. Negative visible other neck soft tissues and lung apices. Disc levels: Multilevel cervical disc, endplate and facet degeneration. But capacious underlying spinal canal. No degenerative cervical spinal stenosis. Degenerative neural foraminal stenosis is  maximal at the right C3 (moderate), left C6 (moderate) and right C7 (moderate to severe) nerve levels. Thoracic spine is detailed separately today. IMPRESSION: 1. Positive for a small solitary area of abnormal spinal cord signal which seems to be central right hemicord myelomalacia near the C4-C5 disc space. 2. Cervical spine degeneration, but capacious spinal canal without spinal stenosis. Degenerative  neural foraminal stenosis is occasionally moderate or severe. Electronically Signed   By: Odessa Fleming M.D.   On: 09/08/2022 10:45   MR BRAIN W WO CONTRAST  Result Date: 09/08/2022 CLINICAL DATA:  56 year old male with history of cardiac arrhythmia, left carotid occlusion, chronic cerebral ischemia. Worsening gait instability with frequent falls. Progressive lower extremity weakness. EXAM: MRI HEAD WITHOUT AND WITH CONTRAST TECHNIQUE: Multiplanar, multiecho pulse sequences of the brain and surrounding structures were obtained without and with intravenous contrast. CONTRAST:  5mL GADAVIST GADOBUTROL 1 MMOL/ML IV SOLN COMPARISON:  Brain MRI 05/10/2022. FINDINGS: Brain: Widespread and confluent also fairly symmetric appearance of abnormal hemispheric T2 and FLAIR hyperintensity in the white matter and with overlying cortical encephalomalacia. This has progressed since 2018 but was developing at that time. Underlying cerebral volume relatively maintained. No associated mass effect. SWI suggests widespread associated hemosiderin in the areas of encephalomalacia, but otherwise only occasional chronic cerebral blood products (such as at the anterior left temporal tip). On DWI today there are scattered small, mostly punctate cortical and subcortical foci of restricted diffusion. These are most numerous both superior hemispheres (series 2, image 38) but there are other hemispheric foci including in both temporal lobes. There is associated corpus callosum atrophy, but no discrete callosal lacunes or lesions. No posterior fossa  or deep gray nuclei involvement. None of the acute areas are enhancing. No superimposed midline shift, mass effect, evidence of mass lesion, ventriculomegaly, extra-axial collection or acute intracranial hemorrhage. Cervicomedullary junction and pituitary are within normal limits. Occasional punctate enhancement associated with the chronic encephalomalacia (such as right middle frontal gyrus series 2, image 34). But no masslike or suspicious enhancement. No dural thickening. 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 Other intracranial artery tortuosity. Following contrast the major dural venous sinuses are enhancing and appear to be patent. Skull and upper cervical spine: Negative for age visible cervical spine. Visualized bone marrow signal is within normal limits. Sinuses/Orbits: Stable, negative. Other: Visible internal auditory structures appear normal. Negative visible scalp and face. IMPRESSION: 1. Scattered punctate acute cortical and subcortical infarcts in both cerebral hemispheres. No associated hemorrhage or mass effect. 2. Severe underlying hemispheric encephalomalacia, with associated advanced white matter disease. There is associated corpus callosum atrophy. Notably, the bilateral deep gray nuclei and posterior fossa are spared. But otherwise but since 2018 progression of this phenomena does seem to reflect an ischemic etiology. And there is known chronic occlusion of the Left ICA. Other etiologies to consider include: MELAS (mitochondrial encephalomyopathy with lactic acidosis and stroke-like episodes), CADASIL (cerebral autosomal dominant arteriopathy with subcortical infarcts and leukoencephalopathy), CNS vasculitis and other small-vessel diseases. Electronically Signed   By: Odessa Fleming M.D.   On: 09/08/2022 10:36   DG Chest 1 View  Result Date: 09/08/2022 CLINICAL DATA:  Weakness.  Altered mental status. EXAM:  CHEST  1 VIEW COMPARISON:  04/11/2017 FINDINGS: Trace atelectasis noted at the lung bases. Lungs otherwise clear. The cardiopericardial silhouette is within normal limits for size. Loop recorder evident. The visualized bony structures of the thorax are unremarkable. IMPRESSION: Trace bibasilar atelectasis. Electronically Signed   By: Kennith Center M.D.   On: 09/08/2022 07:48        Scheduled Meds:  aspirin EC  81 mg Oral Daily   atorvastatin  80 mg Oral Daily   clopidogrel  75 mg Oral Daily   cyanocobalamin  1,000 mcg Intramuscular Once   vitamin B-12  1,000 mcg Oral Daily   enoxaparin (LOVENOX) injection  40 mg Subcutaneous Q24H   gabapentin  300 mg Oral BID   nicotine  21 mg Transdermal Daily   oxybutynin  10 mg Oral QHS   Continuous Infusions:   LOS: 1 day    Time spent:    Erick Blinks, MD [facility]   If 7PM-7AM, please contact night-coverage [email or web address]  09/09/2022, 6:19 PM
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
## 2022-09-09 NOTE — Evaluation (Signed)
Physical Therapy Evaluation Patient Details Name: Daniel Stuart MRN: [id number] DOB: August 09, 1966 Today's Date: 09/09/2022  History of Present Illness  Pt is a 57 y/o male who presented after 2 falls at home.Imaging negative for acute fx. MRI brain showed scattered punctate infarct. PMH: multiple acute infarct in both cerebral hemispheres, parkinsonism, anxiety, tobacco use, Vitamin D deficiency, PVD.  Clinical Impression   Pt admitted secondary to problem above with deficits below. PTA patient was living alone, using rollator, and endorses multiple falls due to knees giving out vs loss of balance. Sister was assisting with groceries and transportation. Pt currently requires +2 moderate assist to come to stand and to pivot to recliner. Patient with ataxic movement patterns with bil LEs with difficulty placing feet where they needed to go to complete turn and then was unable to step backwards towards chair with chair brought up to him.  Anticipate patient will benefit from PT to address problems listed below.Will continue to follow acutely to maximize functional mobility independence and safety.          Recommendations for follow up therapy are one component of a multi-disciplinary discharge planning process, led by the attending physician.  Recommendations may be updated based on patient status, additional functional criteria and insurance authorization.  Follow Up Recommendations Skilled nursing-short term rehab (<3 hours/day) Can patient physically be transported by private vehicle: No    Assistance Recommended at Discharge Frequent or constant Supervision/Assistance  Patient can return home with the following  Two people to help with walking and/or transfers;Assistance with cooking/housework;Direct supervision/assist for medications management;Direct supervision/assist for financial management;Assist for transportation;Help with stairs or ramp for entrance    Equipment Recommendations  None recommended by PT  Recommendations for Other Services       Functional Status Assessment Patient has had a recent decline in their functional status and demonstrates the ability to make significant improvements in function in a reasonable and predictable amount of time.     Precautions / Restrictions Precautions Precautions: Fall Restrictions Weight Bearing Restrictions: No      Mobility  Bed Mobility Overal bed mobility: Needs Assistance Bed Mobility: Supine to Sit     Supine to sit: Mod assist, HOB elevated     General bed mobility comments: slow intiation and sequencing, vc for technique with incr time to follow instructions; assist to raise torso and to scoot hips out to EOB    Transfers Overall transfer level: Needs assistance Equipment used: Rolling walker (2 wheels) Transfers: Sit to/from Stand, Bed to chair/wheelchair/BSC Sit to Stand: Mod assist, +2 physical assistance   Step pivot transfers: Mod assist, +2 physical assistance       General transfer comment: pt with feet sliding forward and requiring blocking; pt initially trying to pull to stand on RW with vc to push from bed; posterior bias and difficulty placing feet (slightly ataxic); could not step backwards and chair brough underneath him    Ambulation/Gait             Pre-gait activities: pivotal steps only due to poor foot placement    Stairs            Wheelchair Mobility    Modified Rankin (Stroke Patients Only)       Balance Overall balance assessment: Needs assistance, History of Falls Sitting-balance support: No upper extremity supported, Feet supported Sitting balance-Leahy Scale: Fair   Postural control: Posterior lean Standing balance support: Bilateral upper extremity supported, During functional activity, Reliant on assistive device for  balance Standing balance-Leahy Scale: Poor                               Pertinent Vitals/Pain Pain  Assessment Pain Assessment: No/denies pain    Home Living Family/patient expects to be discharged to:: Private residence Living Arrangements: Alone Available Help at Discharge: Family;Available PRN/intermittently Type of Home: House Home Access: Level entry       Home Layout: One level Home Equipment: Grab bars - toilet;Rollator (4 wheels);[place] (2 wheels);Cane - single point Additional Comments: per notes, NP placed order for power wheelchair and [place] aide to assist with ADLs earlier this week    Prior Function Prior Level of Function : Needs assist             Mobility Comments: use of rollator for mobility ADLs Comments: reports able to complete ADLs, does what he can for IADLs, sister assists with transportation and groceries     Hand Dominance   Dominant Hand: Right    Extremity/Trunk Assessment   Upper Extremity Assessment Upper Extremity Assessment: Generalized weakness    Lower Extremity Assessment Lower Extremity Assessment: RLE deficits/detail;LLE deficits/detail RLE Deficits / Details: ankle DF limited (15 degrees from neutral--in plantarflexion); strength grossly 4/5 RLE Coordination: decreased gross motor LLE Deficits / Details: ankle DF limited (10 degrees from neutral--in plantarflexion); strength grossly 4/5 LLE Coordination: decreased gross motor    Cervical / Trunk Assessment Cervical / Trunk Assessment: Kyphotic  Communication   Communication: No difficulties;Other (comment) (though slower to respond at times)  Cognition Arousal/Alertness: Awake/alert Behavior During Therapy: WFL for tasks assessed/performed, Anxious, Flat affect Overall Cognitive Status: No family/caregiver present to determine baseline cognitive functioning                                 General Comments: flat affect, pleasant, slow processing and delayed motor planning. Nervous and fearful of falling        General Comments       Exercises     Assessment/Plan    PT Assessment Patient needs continued PT services  PT Problem List Decreased strength;Decreased range of motion;Decreased balance;Decreased mobility;Decreased cognition;Decreased knowledge of use of DME       PT Treatment Interventions DME instruction;Gait training;Functional mobility training;Therapeutic activities;Therapeutic exercise;Balance training;Neuromuscular re-education;Cognitive remediation;Patient/family education    PT Goals (Current goals can be found in the Care Plan section)  Acute Rehab PT Goals Patient Stated Goal: to regain his mobility PT Goal Formulation: With patient Time For Goal Achievement: 09/23/22 Potential to Achieve Goals: Good    Frequency Min 2X/week     Co-evaluation               AM-PAC PT "6 Clicks" Mobility  Outcome Measure Help needed turning from your back to your side while in a flat bed without using bedrails?: A Little Help needed moving from lying on your back to sitting on the side of a flat bed without using bedrails?: A Lot Help needed moving to and from a bed to a chair (including a wheelchair)?: Total Help needed standing up from a chair using your arms (e.g., wheelchair or bedside chair)?: Total Help needed to walk in hospital room?: Total Help needed climbing 3-5 steps with a railing? : Total 6 Click Score: 9    End of Session Equipment Utilized During Treatment: Gait belt Activity Tolerance: Patient tolerated treatment  well Patient left: in chair;with call bell/phone within reach;with chair alarm set Nurse Communication: Mobility status;Need for lift equipment (recommend stedy for back to bed) PT Visit Diagnosis: Other abnormalities of gait and mobility (R26.89);Other symptoms and signs involving the nervous system (R29.898)    Time: [id number] PT Time Calculation (min) (ACUTE ONLY): 22 min   Charges:   PT Evaluation $PT Eval Moderate Complexity: [place],  PT Acute Rehabilitation Services  Office [phone]   Rexanne Mano 09/09/2022, 10:34 AM

## 2022-09-10 ENCOUNTER — Ambulatory Visit: Payer: Medicare Other | Admitting: Neurology

## 2022-09-10 ENCOUNTER — Inpatient Hospital Stay (HOSPITAL_COMMUNITY): Payer: Medicare Other

## 2022-09-10 DIAGNOSIS — I6522 Occlusion and stenosis of left carotid artery: Secondary | ICD-10-CM | POA: Diagnosis not present

## 2022-09-10 DIAGNOSIS — R296 Repeated falls: Secondary | ICD-10-CM | POA: Diagnosis not present

## 2022-09-10 DIAGNOSIS — I639 Cerebral infarction, unspecified: Secondary | ICD-10-CM

## 2022-09-10 DIAGNOSIS — R29898 Other symptoms and signs involving the musculoskeletal system: Secondary | ICD-10-CM | POA: Diagnosis not present

## 2022-09-10 DIAGNOSIS — I6310 Cerebral infarction due to embolism of unspecified precerebral artery: Secondary | ICD-10-CM | POA: Diagnosis not present

## 2022-09-10 DIAGNOSIS — E785 Hyperlipidemia, unspecified: Secondary | ICD-10-CM | POA: Diagnosis not present

## 2022-09-10 LAB — LYME DISEASE SEROLOGY W/REFLEX: Lyme Total Antibody EIA: POSITIVE

## 2022-09-10 LAB — LYME IGG/IGM
Lyme IgG EIA: NEGATIVE
Lyme IgM EIA: POSITIVE
Lyme Interpretation: DETECTED — AB

## 2022-09-10 LAB — ANA W/REFLEX IF POSITIVE: Anti Nuclear Antibody (ANA): NEGATIVE

## 2022-09-10 MED ORDER — THIAMINE HCL 100 MG PO TABS: 100.0000 mg | ORAL_TABLET | Freq: Every day | ORAL | Status: DC

## 2022-09-10 MED ORDER — DOXYCYCLINE HYCLATE 100 MG PO TABS: 100.0000 mg | ORAL_TABLET | Freq: Two times a day (BID) | ORAL | 0 refills | Status: AC

## 2022-09-10 MED ORDER — CLOPIDOGREL BISULFATE 75 MG PO TABS: 75.0000 mg | ORAL_TABLET | Freq: Every day | ORAL | Status: DC

## 2022-09-10 MED ORDER — DOXYCYCLINE HYCLATE 100 MG PO TABS
100.0000 mg | ORAL_TABLET | Freq: Two times a day (BID) | ORAL | Status: DC
  Administered 2022-09-10: 100 mg via ORAL
  Filled 2022-09-10: qty 1

## 2022-09-10 MED ORDER — CYANOCOBALAMIN 1000 MCG PO TABS: 1000.0000 ug | ORAL_TABLET | Freq: Every day | ORAL | Status: AC

## 2022-09-10 NOTE — TOC Transition Note (Signed)
Transition of Care [facility]) - CM/SW Discharge Note   Patient Details  Name: Daniel Stuart MRN: [id number] Date of Birth: 27-Jun-1966  [facility] CM/SW Contact:  Carley Hammed, LCSWA Phone Number: 09/10/2022, 2:05 PM   Clinical Narrative:     Pt to be transported to [facility] via [facility]. Nurse to call report to [phone]. Please notify sister when pt is transported.  Final next level of care: Skilled Nursing Facility Barriers to Discharge: Barriers Resolved   Patient Goals and CMS Choice Patient states their goals for this hospitalization and ongoing recovery are:: Pt is disoriented and unable to participate in goal setting. CMS Medicare.gov Compare Post Acute Care list provided to:: Patient Represenative (must comment) Choice offered to / list presented to : Sibling  Discharge Placement              Patient chooses bed at: [facility] Patient to be transferred to facility by: PTAR Name of family member notified: [name] Patient and family notified of of transfer: 09/10/22  Discharge Plan and Services     Post Acute Care Choice: Skilled Nursing Facility                               Social Determinants of Health (SDOH) Interventions     Readmission Risk Interventions     No data to display

## 2022-09-10 NOTE — Progress Notes (Signed)
TCD bubble study has been completed.   Preliminary results in CV Proc.   Daniel Stuart Eivin Mascio 09/10/2022 2:39 PM

## 2022-09-10 NOTE — TOC Progression Note (Signed)
Transition of Care [facility]) - Progression Note    Patient Details  Name: Daniel Stuart MRN: [id number] Date of Birth: 01/01/1966  Transition of Care [facility]) CM/SW Contact  Carley Hammed, [place] Phone Number: 09/10/2022, 12:13 PM  Clinical Narrative:    CSW was notified pt may be able to discharge today, if medically stable. CSW followed up with sister, [name], and provided bed offers. [name] reviewed and chose [facility]. [facility] is able to accept today if discharged. Pt will need PTAR transport. TOC will continue to follow for DC needs.   Expected Discharge Plan: Skilled Nursing Facility Barriers to Discharge: Continued Medical Work up, SNF Pending bed offer  Expected Discharge Plan and Services Expected Discharge Plan: Skilled Nursing Facility     Post Acute Care Choice: Skilled Nursing Facility Living arrangements for the past 2 months: Single Family Home                                       Social Determinants of Health (SDOH) Interventions    Readmission Risk Interventions     No data to display

## 2022-09-10 NOTE — Evaluation (Signed)
Speech Language Pathology Evaluation Patient Details Name: BEVIN MAYALL MRN: [id number] DOB: 01-07-1966 Today's Date: 09/10/2022 Time: 1443-1540 SLP Time Calculation (min) (ACUTE ONLY): 20 min  Problem List:  Patient Active Problem List   Diagnosis Date Noted   Left carotid artery occlusion 09/09/2022   Weakness of both lower extremities 09/07/2022   Impaired mobility 08/25/2022   PAD (peripheral artery disease) (HCC) 08/25/2022   Need for influenza vaccination 08/25/2022   Frequent falls 03/11/2020   Arrhythmia as indication for cardiac pacemaker replacement 03/11/2020   Urinary frequency 03/11/2020   Vitamin D deficiency 09/13/2019   S/P stroke due to cerebrovascular disease 03/12/2019   Right sided weakness 03/12/2019   Anxiety 03/12/2019   Hyperlipidemia    Tobacco abuse 04/12/2017   Dyslipidemia 04/12/2017   PVD (peripheral vascular disease) (HCC) 04/12/2017   Carotid arterial disease (HCC) 04/12/2017   CVA (cerebral vascular accident) (HCC) 04/11/2017   Past Medical History:  Past Medical History:  Diagnosis Date   Anxiety    Arrhythmia as indication for cardiac pacemaker replacement 03/2020   Frequent falls 03/2020   History of loop recorder 04/2017   Stroke (cerebrum) (HCC)    Tobacco use    Urinary frequency 09/2019   Vitamin D deficiency    Past Surgical History:  Past Surgical History:  Procedure Laterality Date   FOOT SURGERY     LOOP RECORDER INSERTION N/A 04/13/2017   Procedure: Loop Recorder Insertion;  Surgeon: Marinus Maw, MD;  Location: [facility] INVASIVE CV LAB;  Service: Cardiovascular;  Laterality: N/A;   TEE WITHOUT CARDIOVERSION N/A 04/13/2017   Procedure: TRANSESOPHAGEAL ECHOCARDIOGRAM (TEE);  Surgeon: Jake Bathe, MD;  Location: [facility] ENDOSCOPY;  Service: Cardiovascular;  Laterality: N/A;   HPI:  Mr. JOHNERIC MCFADDEN is a 56 y.o. male with history of multiple strokes, frequent falls, tobacco use, left carotid occlusion, HLD and PVD  presenting with frequent falls and gait disturbance.  He was found to have multiple punctate strokes on brain MRI.  He has no history of atrial fibrillation with a negative evaluation with a loop recorder in the past. Strokes are cryptogenic.   Assessment / Plan / Recommendation Clinical Impression  Pt demonstrates moderate cognitive linguistic deficits that may be baseline per daughter on the phone. Pt struggles to initiate expression of wants and needs or very basic problem solving, (like putting down the phone when needed). He is disfluent and has difficulty with word finding. Cannot complete reading a simple phrase, saying the alphabet or complete basic biographical information with cues to sustain train of thought. He often says "I cant remember" but does well with cueing. He reports that his sister drives him and does his shopping and finances and appts. He says he takes his own medication, but cannot name any of the meds he takes. Likely this is a decline from baseline though cognitive impairment also present prior to admit. He would have trouble making meals and making phone calls. Recommend SNF therapy and SLP f/u.    SLP Assessment  SLP Recommendation/Assessment: Patient needs continued Speech Lanaguage Pathology Services SLP Visit Diagnosis: Cognitive communication deficit (R41.841)    Recommendations for follow up therapy are one component of a multi-disciplinary discharge planning process, led by the attending physician.  Recommendations may be updated based on patient status, additional functional criteria and insurance authorization.    Follow Up Recommendations  Skilled nursing-short term rehab (<3 hours/day)    Assistance Recommended at Discharge  Frequent or constant Supervision/Assistance  Functional  Status Assessment Patient has had a recent decline in their functional status and demonstrates the ability to make significant improvements in function in a reasonable and predictable  amount of time.  Frequency and Duration           SLP Evaluation Cognition  Overall Cognitive Status: History of cognitive impairments - at baseline Arousal/Alertness: Awake/alert Orientation Level: Oriented to person;Oriented to place;Disoriented to time;Oriented to situation Attention: Focused;Sustained Focused Attention: Appears intact Sustained Attention: Impaired Sustained Attention Impairment: Verbal basic;Functional basic Memory: Impaired Memory Impairment: Storage deficit;Retrieval deficit Awareness: Impaired Awareness Impairment: Intellectual impairment;Emergent impairment Problem Solving: Impaired Problem Solving Impairment: Verbal basic;Functional basic Executive Function: Reasoning;Initiating Reasoning: Impaired Reasoning Impairment: Verbal basic;Functional basic Initiating: Impaired Initiating Impairment: Verbal basic;Functional basic Safety/Judgment: Impaired       Comprehension  Auditory Comprehension Overall Auditory Comprehension: Impaired Yes/No Questions: Not tested Commands: Impaired One Step Basic Commands: 75-100% accurate Two Step Basic Commands: 0-24% accurate Conversation: Simple Reading Comprehension Reading Status: Impaired Word level: Within functional limits Sentence Level: Impaired    Expression Verbal Expression Overall Verbal Expression: Impaired Initiation: Impaired Automatic Speech: Name (alphabet) Level of Generative/Spontaneous Verbalization: Word;Phrase Repetition: No impairment Naming: Impairment Responsive: Not tested Confrontation: Impaired Interfering Components: Premorbid deficit (hx of stuttering) Effective Techniques: Open ended questions;Semantic cues;Sentence completion;Phonemic cues   Oral / Motor  Oral Motor/Sensory Function Overall Oral Motor/Sensory Function: Within functional limits Motor Speech Respiration: Within functional limits Phonation: Normal Resonance: Within functional limits Articulation:  Impaired Level of Impairment: Phrase Intelligibility: Intelligible Motor Planning: Witnin functional limits Motor Speech Errors: Aware Interfering Components: Premorbid status (stuttering)            Kalei Meda, Riley Nearing 09/10/2022, 12:29 PM

## 2022-09-10 NOTE — TOC CAGE-AID Note (Signed)
Transition of Care [facility]) - CAGE-AID Screening   Patient Details  Name: Daniel Stuart MRN: [id number] Date of Birth: [birth year]  Transition of Care [facility]) CM/SW Contact:    Carley Hammed, LCSWA Phone Number: 09/10/2022, 2:12 PM   Clinical Narrative: Pt is currently disoriented and not appropriate for CAGE- AID assessment.    CAGE-AID Screening: Substance Abuse Screening unable to be completed due to: : Patient unable to participate

## 2022-09-10 NOTE — Progress Notes (Signed)
STROKE TEAM PROGRESS NOTE   INTERVAL HISTORY Patient is sitting in a bedside chair.  He has no complaints today.  Therapies have recommended rehab and skilled nursing setting.  RPR is negative.  ESR is 19 mm.  IgM Lyme antibodies detected patient denies history of any rash or tick bite.  ANA is negative  Vitals:   09/10/22 0548 09/10/22 0551 09/10/22 0600 09/10/22 0827  BP:    95/74  Pulse:    78  Resp: _0 Temp:    98.2 F (36.8 C)  TempSrc:    Oral  SpO2:    100%  Height:       CBC:  Recent Labs  Lab 09/07/22 2029 09/08/22 0300 09/09/22 0653  WBC 16.1* 10.7* 7.9  NEUTROABS 13.6*  --   --   HGB 12.4* 12.0* 12.8*  HCT 34.2* 33.7* 36.7*  MCV 93.4 94.7 95.1  PLT 172 129* 878*   Basic Metabolic Panel:  Recent Labs  Lab 09/08/22 0300 09/09/22 0653  NA 140 139  K 4.1 3.6  CL 104 104  CO2 26 25  GLUCOSE 94 80  BUN 6 7  CREATININE 1.18 1.14  CALCIUM 8.6* 8.6*   Lipid Panel:  Recent Labs  Lab 09/08/22 0300  CHOL 76  TRIG 55  HDL 30*  CHOLHDL 2.5  VLDL 11  LDLCALC 35   HgbA1c:  Recent Labs  Lab 09/08/22 0300  HGBA1C 5.6   Urine Drug Screen: No results for input(s): "LABOPIA", "COCAINSCRNUR", "LABBENZ", "AMPHETMU", "THCU", "LABBARB" in the last 168 hours.  Alcohol Level  Recent Labs  Lab 09/07/22 2029  ETH <10    IMAGING past 24 hours No results found.  PHYSICAL EXAM General:  Drowsy, thin appearing patient in no acute distress Respiratory:  Regular, unlabored respirations on room air  NEURO:  Mental Status: AA&Ox2 Speech/Language: speech is without dysarthria or aphasia.    Cranial Nerves:  II: PERRL.  III, IV, VI: EOMI. Eyelids elevate symmetrically.  V: Sensation is intact to light touch and symmetrical to face.  VII: Right sided facial droop  VIII: hearing intact to voice. IX, X: Palate elevates symmetrically. Phonation is normal.  MV:EHMCNOBS shrug 5/5. XII: tongue is midline without fasciculations. Motor: 5/5 strength to LUE  and LLE, 4/5 to RUE and RLE  Tone: is increased bilaterally and bulk is normal.  Deep tendon reflexes are exaggerated bilaterally sensation- Intact to light touch bilaterally.   Coordination: FTN intact bilaterally,  DTRs: hyperreflexic Gait- deferred   ASSESSMENT/PLAN Mr. JOWAN SKILLIN is a 56 y.o. male with history of multiple strokes, frequent falls, tobacco use, left carotid occlusion, HLD and PVD presenting with frequent falls and gait disturbance.  He was found to have multiple punctate strokes on brain MRI.  He has no history of atrial fibrillation with a negative evaluation with a loop recorder in the past. Strokes are cryptogenic.  Stroke:  Multiple punctate strokes in bilateral hemispheres cortex and subcortical regions. Etiology:  cryptogenic  .  Progression of mostly supratentorial white matter changes raise concern for territory small vessel disease conditions ? CADASIL versu sMELAS doubt CNS vasculitis CT head Extensive areas of encephalomalacia and gliosis involving both hemispheres CTA head & neck occluded left ICA, no other high grade stenosis or occlusion MRI  scattered acute punctate infarcts in bilateral hemispheres with underlying encephalomalacia and advanced white matter disease Carotid Doppler  total occlusion of left ICA 1-39% stenosis in right ICA 2D Echo EF 55-60%, grade  1 diastolic dysfunction, no atrial level shunt LDL 35 HgbA1c 5.6 VTE prophylaxis - lovenox    Diet   Diet Heart Room service appropriate? Yes; Fluid consistency: Thin   aspirin 81 mg daily prior to admission, now on aspirin 81 mg daily and clopidogrel 75 mg daily. X 3 weeks and then plavix alone Therapy recommendations:  SNF Disposition:  pending  Hypertension Home meds:  none Stable Permissive hypertension (OK if < 220/120) but gradually normalize in 5-7 days Long-term BP goal normotensive  Hyperlipidemia Home meds:  none LDL 35, goal < 70 High intensity statin not indicated as LDL  below goal Continue statin at discharge  Cognitive impairment Evidence of cognitive impairment seen on exam SLP to perform full cognitive evlauation  Other Stroke Risk Factors Cigarette smoker advised to stop smoking Hx stroke  Other Active Problems none  Hospital day # 2  Patient presented with subacute gait difficulties with frequent falls and decreasing cognition and MRI scan shows bilateral scattered tiny punctate cortical and subcortical infarcts of cryptogenic etiology.  He has had previous strokes with extensive work-up in 2018 and interestingly MRI scan also shows progressive bilateral supratentorial symmetric white matter changes raising concern for hereditary small vessel disease etiologies like CADASIL or MELAS.  ANA panel is negative but Lyme titer is positive.  Recommend treatment as per primary team for Lyme's though I am not sure that it 04/17/1961 can explain his clinical presentation. Await lab results for genetic testing CADASIL or MELAS.  Greater 50% time during this 35-minute visit was spent on counseling and coordination of care about his strokes and discussion about evaluation and treatment and answering questions.  Discussed with Dr. Roderic Palau. Antony Contras, MD Medical Director [facility] Pager: [phone] 09/10/2022 12:00 PM   To contact Stroke Continuity provider, please refer to [email or web address] After hours, contact General Neurology

## 2022-09-10 NOTE — Discharge Summary (Signed)
Physician Discharge Summary  Daniel Stuart RVU:[id number] DOB: 1966-06-21 DOA: 09/07/2022  PCP: [name], [name]  Admit date: 09/07/2022 Discharge date: 09/10/2022  Admitted From: home Disposition:  SNF  Recommendations for Outpatient Follow-up:  Follow up with PCP in 1-2 weeks Please obtain BMP/CBC in one week Follow up with neurology as outpatient   Discharge Condition:stable CODE STATUS:full code Diet recommendation: heart healthy  Brief/Interim Summary: 56 year old male who normally lives independently, prior history of stroke, presents to the hospital with worsening bilateral lower extremity weakness and recurrent falls.  Work-up in the emergency room with MRI does show bilateral acute cortical infarcts.  He is being admitted for further evaluation.   Discharge Diagnoses:  Principal Problem:   Weakness of both lower extremities Active Problems:   CVA (cerebral vascular accident) ([place])   Tobacco abuse   Dyslipidemia   PVD (peripheral vascular disease) ([place])   Frequent falls   Impaired mobility   PAD (peripheral artery disease) ([place])   Left carotid artery occlusion  Acute CVA -MRI brain shows bilateral cortical infarcts -Currently on DAPT with aspirin and Plavix -He is already on high intensity statin, LDL 35 -A1c 5.6 -PT/OT evaluation with recommendations for skilled nursing facility -He passed bedside swallow screen -Echocardiogram unremarkable -Carotid Dopplers with chronic left ICA occlusion -CTA of head and neck also shows chronic left ICA occlusion with no other LVO -transcranial dopplers with bubble study negative -Appreciate neurology input -aspirin and plavix for 3 weeks then plavix alone -Further work-up for vasculitis including  ANCA, and antiphospholipid panel in process at time of discharge.   -ESR 19 -ANA negative -HIV/RPR negative. -Lyme IgM antibody returned positive. This was discussed with patient family and he has not had any recent  rash or tick bite. His overall neuro decline timeline is not entirely clear. Discussed with Infectious disease Dr. Candiss Norse and it was felt since overall symptoms timeline not clear, patient can be treated with doxycycline 153m bid for 21 days -further work up by genetic testing for possible CADASIL or MELAS can be discussed as outpatient with neuro follow up   B12 deficiency -B12 level 174 -Started on replacement therapy -B1 level pending, empirically started replacement   Frequent falls -Has baseline bilateral lower extremity weakness and right sided weakness from prior stroke -Normally ambulates with a rollator -PT/OT with recommendations for skilled nursing facility   Hyperlipidemia -Continue statin   Leukocytosis -Afebrile, no signs of infection -Chest x-ray without pneumonia -UA has been ordered -Possibly related to dehydration -WBC count has normalized with IV fluids  Discharge Instructions  Discharge Instructions     Diet - low sodium heart healthy   Complete by: As directed    Increase activity slowly   Complete by: As directed       Allergies as of 09/10/2022   No Known Allergies      Medication List     TAKE these medications    acetaminophen 500 MG tablet Commonly known as: TYLENOL Take 1,000 mg by mouth every 6 (six) hours as needed for mild pain.   aspirin EC 81 MG tablet Take 1 tablet (81 mg total) by mouth daily.   atorvastatin 80 MG tablet Commonly known as: LIPITOR TAKE 1 TABLET (80 MG TOTAL) BY MOUTH EVERY EVENING.   cholecalciferol 25 MCG (1000 UNIT) tablet Commonly known as: VITAMIN D3 Take 1,000 Units by mouth daily.   clopidogrel 75 MG tablet Commonly known as: PLAVIX Take 1 tablet (75 mg total) by mouth daily. Start  taking on: September 11, 2022   cyanocobalamin 1000 MCG tablet Take 1 tablet (1,000 mcg total) by mouth daily. Start taking on: September 11, 2022   doxycycline 100 MG tablet Commonly known as: VIBRA-TABS Take 1  tablet (100 mg total) by mouth every 12 (twelve) hours for 21 days.   gabapentin 300 MG capsule Commonly known as: NEURONTIN TAKE 1 CAPSULE BY MOUTH BY MOUTH TWO TIMES DAILY   oxybutynin 10 MG 24 hr tablet Commonly known as: DITROPAN-XL Take 1 tablet (10 mg total) by mouth at bedtime.   thiamine 100 MG tablet Commonly known as: VITAMIN B1 Take 1 tablet (100 mg total) by mouth daily.        Contact information for after-discharge care     Destination     [facility] Preferred SNF .   Service: Skilled Nursing Contact information: [address] [place] [phone]                    No Known Allergies  Consultations: Neurology Infectious Disease (phone)   Procedures/Studies: VAS US CAROTID  Result Date: 09/09/2022 Carotid Arterial Duplex Study Patient Name:  Daniel Stuart  Date of Exam:   09/08/2022 Medical Rec #: [id number]          Accession #:    [id number] Date of Birth: 1966-04-04          Patient Gender: M Patient Age:   7 years Exam Location:  [facility] Procedure:      VAS US CAROTID Referring Phys: Jolaine Artist Morey Andonian --------------------------------------------------------------------------------  Indications:       CVA. Risk Factors:      Prior CVA. Comparison Study:  04/12/17 prior Performing Technologist: Archie Patten RVS  Examination Guidelines: A complete evaluation includes B-mode imaging, spectral Doppler, color Doppler, and power Doppler as needed of all accessible portions of each vessel. Bilateral testing is considered an integral part of a complete examination. Limited examinations for reoccurring indications may be performed as noted.  Right Carotid Findings: +----------+--------+--------+--------+------------------+--------+           PSV cm/sEDV cm/sStenosisPlaque DescriptionComments +----------+--------+--------+--------+------------------+--------+ CCA Prox  104     25               heterogenous               +----------+--------+--------+--------+------------------+--------+ CCA Distal98      27              heterogenous               +----------+--------+--------+--------+------------------+--------+ ICA Prox  89      30      1-39%   heterogenous      tortuous +----------+--------+--------+--------+------------------+--------+ ICA Distal99      35                                         +----------+--------+--------+--------+------------------+--------+ ECA       76      17                                         +----------+--------+--------+--------+------------------+--------+ +----------+--------+-------+--------+-------------------+           PSV cm/sEDV cmsDescribeArm Pressure (mmHG) +----------+--------+-------+--------+-------------------+ [id number]                                         +----------+--------+-------+--------+-------------------+ +---------+--------+--+--------+--+---------+  VertebralPSV cm/s41EDV cm/s13Antegrade +---------+--------+--+--------+--+---------+  Left Carotid Findings: +----------+--------+--------+--------+------------------+--------+           PSV cm/sEDV cm/sStenosisPlaque DescriptionComments +----------+--------+--------+--------+------------------+--------+ CCA Prox  71                      heterogenous               +----------+--------+--------+--------+------------------+--------+ CCA Distal81      14              heterogenous               +----------+--------+--------+--------+------------------+--------+ ICA Prox                  Occluded                           +----------+--------+--------+--------+------------------+--------+ ICA Mid                   Occluded                           +----------+--------+--------+--------+------------------+--------+ ICA Distal                Occluded                            +----------+--------+--------+--------+------------------+--------+ ECA       78      12                                         +----------+--------+--------+--------+------------------+--------+ +----------+--------+--------+--------+-------------------+           PSV cm/sEDV cm/sDescribeArm Pressure (mmHG) +----------+--------+--------+--------+-------------------+ [id number]                                          +----------+--------+--------+--------+-------------------+ +---------+--------+--+--------+--+---------+ VertebralPSV cm/s61EDV cm/s14Antegrade +---------+--------+--+--------+--+---------+   Summary: Right Carotid: Velocities in the right ICA are consistent with a 1-39% stenosis. Left Carotid: Evidence consistent with a total occlusion of the left ICA. Vertebrals: Bilateral vertebral arteries demonstrate antegrade flow. *See table(s) above for measurements and observations.  Electronically signed by Antony Contras MD on 09/09/2022 at 1:39:14 PM.    Final    CT ANGIO HEAD NECK W WO CM  Result Date: 09/09/2022 CLINICAL DATA:  Stroke/TIA, determine embolic source. EXAM: CT ANGIOGRAPHY HEAD AND NECK TECHNIQUE: Multidetector CT imaging of the head and neck was performed using the standard protocol during bolus administration of intravenous contrast. Multiplanar CT image reconstructions and MIPs were obtained to evaluate the vascular anatomy. Carotid stenosis measurements (when applicable) are obtained utilizing NASCET criteria, using the distal internal carotid diameter as the denominator. RADIATION DOSE REDUCTION: This exam was performed according to the departmental dose-optimization program which includes automated exposure control, adjustment of the mA and/or kV according to patient size and/or use of iterative reconstruction technique. CONTRAST:  39m OMNIPAQUE IOHEXOL 350 MG/ML SOLN COMPARISON:  MRI of the brain September 08, 2022. FINDINGS: CT HEAD FINDINGS Brain:  Extensive areas of encephalomalacia and gliosis involving both cerebral hemispheres watershed zones with sparing of deep gray nuclei, similar to seen on prior studies. Small acute infarcts in the bilateral cerebral hemispheres are better demonstrated on recent MRI. No acute hemorrhage, hydrocephalus,  extra-axial collection or mass lesion. Vascular: No hyperdense vessel or unexpected calcification. Skull: Normal. Negative for fracture or focal lesion. Sinuses/Orbits: No acute finding. Other: None. Review of the MIP images confirms the above findings CTA NECK FINDINGS Aortic arch: Common origin of the innominate and left common carotid artery from the aortic arch. The left vertebral artery originates directly from the aortic arch. Imaged portion shows no evidence of aneurysm or dissection. No significant stenosis of the major arch vessel origins. Right carotid system: No evidence of dissection, stenosis (50% or greater), or occlusion. Left carotid system: Patent left common carotid artery and external carotid branches. Occlusion of the left internal carotid artery at the bulb. Vertebral arteries: Codominant. No evidence of dissection, stenosis (50% or greater), or occlusion. Skeleton: Negative. Other neck: Subcentimeter incidental thyroid nodule. No follow-up imaging is recommended. Reference: J Am Coll Radiol. 2015 Feb;12(2): 143-50. Upper chest: Centrilobular emphysema. 8 mm left subpleural nodule, partially imaged. Per Fleischner Society Guidelines, recommend a non-contrast Chest CT at 6-12 months, then another non-contrast Chest CT at 18-24 months. These guidelines do not apply to immunocompromised patients and patients with cancer. Follow up in patients with significant comorbidities as clinically warranted. For lung cancer screening, adhere to Lung-RADS guidelines. Reference: Radiology. 2017; 284(1):228-43. Review of the MIP images confirms the above findings CTA HEAD FINDINGS Anterior circulation: Normal course  and caliber of the intracranial right internal carotid artery. Occluded left internal carotid artery at the skull base with a constitution at the of the mix segment. The supraclinoid left ICA has diminutive caliber. Asymmetry of the core needle cerebral arteries with smaller caliber of the left M1/MCA. Prominent bilateral posterior communicating arteries. No focal or hemodynamically significant stenosis. The bilateral ACA vascular trees are maintained. Posterior circulation: No significant stenosis, proximal occlusion, aneurysm, or vascular malformation. Venous sinuses: As permitted by contrast timing, patent. Anatomic variants: Small A-comm fenestration. Review of the MIP images confirms the above findings IMPRESSION: 1. Occlusion of the left internal carotid artery at the bulb with reconstitution at the ophthalmic segment. 2. No high-grade intracranial stenosis or occlusion. 3. Extensive areas of encephalomalacia and gliosis involving both cerebral hemispheres watershed zones with sparing of the deep gray nuclei, similar to prior studies. Small acute infarcts in the bilateral cerebral hemispheres are better demonstrated on recent MRI. 4. 8 mm left subpleural nodule, partially imaged. 5. Emphysema. Emphysema (ICD10-J43.9). Electronically Signed   By: Pedro Earls M.D.   On: 09/09/2022 10:28   ECHOCARDIOGRAM COMPLETE  Result Date: 09/08/2022    ECHOCARDIOGRAM REPORT   Patient Name:   Daniel Stuart Date of Exam: 09/08/2022 Medical Rec #:  [id number]         Height:       66.0 in Accession #:    [id number]        Weight:       123.0 lb Date of Birth:  Oct 26, 1966         BSA:          1.627 m Patient Age:    89 years          BP:           97/68 mmHg Patient Gender: M                 HR:           79 bpm. Exam Location:  Inpatient Procedure: 2D Echo, Cardiac Doppler and Color Doppler Indications:    Stroke  History:  Patient has no prior history of Echocardiogram examinations.                  Stroke and PAD; Risk Factors:Dyslipidemia.  Sonographer:    Meagan Baucom RDCS, FE, PE Referring Phys: Omega  1. Left ventricular ejection fraction, by estimation, is 55 to 60%. The left ventricle has normal function. The left ventricle has no regional wall motion abnormalities. Left ventricular diastolic parameters are consistent with Grade I diastolic dysfunction (impaired relaxation).  2. Right ventricular systolic function is normal. The right ventricular size is normal. There is normal pulmonary artery systolic pressure. The estimated right ventricular systolic pressure is 85.6 mmHg.  3. The mitral valve is normal in structure. No evidence of mitral valve regurgitation. No evidence of mitral stenosis.  4. The aortic valve is tricuspid. There is mild calcification of the aortic valve. Aortic valve regurgitation is mild. No aortic stenosis is present.  5. The inferior vena cava is normal in size with greater than 50% respiratory variability, suggesting right atrial pressure of 3 mmHg. FINDINGS  Left Ventricle: Left ventricular ejection fraction, by estimation, is 55 to 60%. The left ventricle has normal function. The left ventricle has no regional wall motion abnormalities. The left ventricular internal cavity size was normal in size. There is  no left ventricular hypertrophy. Left ventricular diastolic parameters are consistent with Grade I diastolic dysfunction (impaired relaxation). Right Ventricle: The right ventricular size is normal. No increase in right ventricular wall thickness. Right ventricular systolic function is normal. There is normal pulmonary artery systolic pressure. The tricuspid regurgitant velocity is 2.17 m/s, and  with an assumed right atrial pressure of 3 mmHg, the estimated right ventricular systolic pressure is 31.4 mmHg. Left Atrium: Left atrial size was normal in size. Right Atrium: Right atrial size was normal in size. Pericardium: There is no evidence of  pericardial effusion. Mitral Valve: The mitral valve is normal in structure. No evidence of mitral valve regurgitation. No evidence of mitral valve stenosis. Tricuspid Valve: The tricuspid valve is normal in structure. Tricuspid valve regurgitation is trivial. Aortic Valve: The aortic valve is tricuspid. There is mild calcification of the aortic valve. Aortic valve regurgitation is mild. No aortic stenosis is present. Pulmonic Valve: The pulmonic valve was normal in structure. Pulmonic valve regurgitation is not visualized. Aorta: The aortic root is normal in size and structure. Venous: The inferior vena cava is normal in size with greater than 50% respiratory variability, suggesting right atrial pressure of 3 mmHg. IAS/Shunts: No atrial level shunt detected by color flow Doppler.  LEFT VENTRICLE PLAX 2D LVIDd:         3.90 cm   Diastology LVIDs:         2.60 cm   LV e' medial:    6.20 cm/s LV PW:         0.90 cm   LV E/e' medial:  13.0 LV IVS:        0.80 cm   LV e' lateral:   8.27 cm/s LVOT diam:     2.00 cm   LV E/e' lateral: 9.7 LV SV:         56 LV SV Index:   34 LVOT Area:     3.14 cm  RIGHT VENTRICLE RV S prime:     13.70 cm/s TAPSE (M-mode): 1.1 cm LEFT ATRIUM             Index        RIGHT ATRIUM  Index LA diam:        2.10 cm 1.29 cm/m   RA Area:     9.60 cm LA Vol (A2C):   16.7 ml 10.27 ml/m  RA Volume:   18.80 ml 11.56 ml/m LA Vol (A4C):   24.6 ml 15.12 ml/m LA Biplane Vol: 21.9 ml 13.46 ml/m  AORTIC VALVE LVOT Vmax:   106.00 cm/s LVOT Vmean:  60.700 cm/s LVOT VTI:    0.177 m  AORTA Ao Root diam: 2.80 cm MITRAL VALVE               TRICUSPID VALVE MV Area (PHT): 2.99 cm    TR Peak grad:   18.8 mmHg MV Decel Time: 254 msec    TR Vmax:        217.00 cm/s MV E velocity: 80.60 cm/s MV A velocity: 85.30 cm/s  SHUNTS MV E/A ratio:  0.94        Systemic VTI:  0.18 m                            Systemic Diam: 2.00 cm Dalton McleanMD Electronically signed by Franki Monte Signature Date/Time:  09/08/2022/6:07:00 PM    Final    MR Lumbar Spine W Wo Contrast  Result Date: 09/08/2022 CLINICAL DATA:  56 year old male with history of cardiac arrhythmia, left carotid occlusion, chronic cerebral ischemia. Worsening gait instability with frequent falls. Progressive lower extremity weakness. EXAM: MRI LUMBAR SPINE WITHOUT AND WITH CONTRAST TECHNIQUE: Multiplanar and multiecho pulse sequences of the lumbar spine were obtained without and with intravenous contrast. CONTRAST:  73m GADAVIST GADOBUTROL 1 MMOL/ML IV SOLN in conjunction with contrast enhanced imaging of the brain, cervical and thoracic spine reported separately. COMPARISON:  Brain, cervical and thoracic MRI today reported separately. FINDINGS: Segmentation: Appears to be normal and concordant with the thoracic MRI numbering today. Alignment:  Normal aside from mild straightening of lumbar lordosis. Vertebrae: No marrow edema or evidence of acute osseous abnormality. Visualized bone marrow signal is within normal limits. Intact visible sacrum and SI joints. Conus medullaris and cauda equina: Conus extends to the L1 level. No lower spinal cord or conus signal abnormality. Capacious spinal canal. Cauda equina nerve roots appear normal, with no nerve root thickening or abnormal enhancement. Paraspinal and other soft tissues: Negative. Disc levels: Essentially normal for age. Disc desiccation and disc bulging most pronounced at L5-S1. No spinal stenosis. Mild to moderate multifactorial bilateral L5 neural foraminal stenosis. IMPRESSION: Normal for age MRI appearance of the lumbar spine, with capacious spinal canal and normal cauda equina nerve roots. Electronically Signed   By: HGenevie AnnM.D.   On: 09/08/2022 10:52   MR THORACIC SPINE W WO CONTRAST  Result Date: 09/08/2022 CLINICAL DATA:  56year old male with history of cardiac arrhythmia, left carotid occlusion, chronic cerebral ischemia. Worsening gait instability with frequent falls. Progressive  lower extremity weakness. EXAM: MRI THORACIC WITHOUT AND WITH CONTRAST TECHNIQUE: Multiplanar and multiecho pulse sequences of the thoracic spine were obtained without and with intravenous contrast. CONTRAST:  515mGADAVIST GADOBUTROL 1 MMOL/ML IV SOLN in conjunction with contrast enhanced imaging of the brain and cervical spine reported separately. COMPARISON:  None Available. FINDINGS: Limited cervical spine imaging:  Reported separately today. Thoracic spine segmentation:  Appears to be normal. Alignment: Relatively normal thoracic kyphosis. No significant scoliosis or spondylolisthesis. Vertebrae: Visualized bone marrow signal is within normal limits. No marrow edema or evidence of acute osseous abnormality. Cord: Capacious  thoracic spinal canal throughout. No thoracic cord signal abnormality or volume loss identified. No abnormal intradural enhancement or dural thickening. Conus medullaris appears to be normal at T12-L1. Paraspinal and other soft tissues: Negative. Disc levels: Normal for age. No significant degenerative disease or neural impingement identified. IMPRESSION: Negative MRI appearance of the thoracic spine and spinal cord. Electronically Signed   By: Genevie Ann M.D.   On: 09/08/2022 10:49   MR Cervical Spine W or Wo Contrast  Result Date: 09/08/2022 CLINICAL DATA:  56 year old male with history of cardiac arrhythmia, left carotid occlusion, chronic cerebral ischemia. Worsening gait instability with frequent falls. Progressive lower extremity weakness. EXAM: MRI CERVICAL SPINE WITHOUT AND WITH CONTRAST TECHNIQUE: Multiplanar and multiecho pulse sequences of the cervical spine, to include the craniocervical junction and cervicothoracic junction, were obtained without and with intravenous contrast. CONTRAST:  65m GADAVIST GADOBUTROL 1 MMOL/ML IV SOLN in conjunction with contrast enhanced imaging of the brain reported separately. COMPARISON:  Brain MRI today reported separately. FINDINGS: Alignment:  Straightening of cervical lordosis. No significant scoliosis or spondylolisthesis. Vertebrae: No marrow edema or evidence of acute osseous abnormality. Visualized bone marrow signal is within normal limits. Cord: Sagittal T2 and STIR images suggest a small abnormal focus of increased signal within the substance of the spinal cord just above the C4-C5 disc (series 13, image 8) with no associated cord expansion or enhancement. And this lesion is correlated on axial T2* as being in the central gray matter in the right hemicord series 16, image 12. There is subtle right hemi cord volume loss there. Elsewhere spinal cord signal and morphology appear within normal limits. No abnormal intradural enhancement or dural thickening. Posterior Fossa, vertebral arteries, paraspinal tissues: Cervicomedullary junction is within normal limits. There is loss of the normal left ICA flow void in the neck corresponding to known chronic occlusion. Other major vascular flow voids in the neck are preserved. Negative visible other neck soft tissues and lung apices. Disc levels: Multilevel cervical disc, endplate and facet degeneration. But capacious underlying spinal canal. No degenerative cervical spinal stenosis. Degenerative neural foraminal stenosis is maximal at the right C3 (moderate), left C6 (moderate) and right C7 (moderate to severe) nerve levels. Thoracic spine is detailed separately today. IMPRESSION: 1. Positive for a small solitary area of abnormal spinal cord signal which seems to be central right hemicord myelomalacia near the C4-C5 disc space. 2. Cervical spine degeneration, but capacious spinal canal without spinal stenosis. Degenerative neural foraminal stenosis is occasionally moderate or severe. Electronically Signed   By: HGenevie AnnM.D.   On: 09/08/2022 10:45   MR BRAIN W WO CONTRAST  Result Date: 09/08/2022 CLINICAL DATA:  56year old male with history of cardiac arrhythmia, left carotid occlusion, chronic cerebral  ischemia. Worsening gait instability with frequent falls. Progressive lower extremity weakness. EXAM: MRI HEAD WITHOUT AND WITH CONTRAST TECHNIQUE: Multiplanar, multiecho pulse sequences of the brain and surrounding structures were obtained without and with intravenous contrast. CONTRAST:  556mGADAVIST GADOBUTROL 1 MMOL/ML IV SOLN COMPARISON:  Brain MRI 05/10/2022. FINDINGS: Brain: Widespread and confluent also fairly symmetric appearance of abnormal hemispheric T2 and FLAIR hyperintensity in the white matter and with overlying cortical encephalomalacia. This has progressed since 2018 but was developing at that time. Underlying cerebral volume relatively maintained. No associated mass effect. SWI suggests widespread associated hemosiderin in the areas of encephalomalacia, but otherwise only occasional chronic cerebral blood products (such as at the anterior left temporal tip). On DWI today there are scattered small, mostly punctate  cortical and subcortical foci of restricted diffusion. These are most numerous both superior hemispheres (series 2, image 38) but there are other hemispheric foci including in both temporal lobes. There is associated corpus callosum atrophy, but no discrete callosal lacunes or lesions. No posterior fossa or deep gray nuclei involvement. None of the acute areas are enhancing. No superimposed midline shift, mass effect, evidence of mass lesion, ventriculomegaly, extra-axial collection or acute intracranial hemorrhage. Cervicomedullary junction and pituitary are within normal limits. Occasional punctate enhancement associated with the chronic encephalomalacia (such as right middle frontal gyrus series 2, image 34). But no masslike or suspicious enhancement. No dural thickening. 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 Other intracranial artery tortuosity. Following contrast the major dural venous sinuses are  enhancing and appear to be patent. Skull and upper cervical spine: Negative for age visible cervical spine. Visualized bone marrow signal is within normal limits. Sinuses/Orbits: Stable, negative. Other: Visible internal auditory structures appear normal. Negative visible scalp and face. IMPRESSION: 1. Scattered punctate acute cortical and subcortical infarcts in both cerebral hemispheres. No associated hemorrhage or mass effect. 2. Severe underlying hemispheric encephalomalacia, with associated advanced white matter disease. There is associated corpus callosum atrophy. Notably, the bilateral deep gray nuclei and posterior fossa are spared. But otherwise but since 2018 progression of this phenomena does seem to reflect an ischemic etiology. And there is known chronic occlusion of the Left ICA. Other etiologies to consider include: MELAS (mitochondrial encephalomyopathy with lactic acidosis and stroke-like episodes), CADASIL (cerebral autosomal dominant arteriopathy with subcortical infarcts and leukoencephalopathy), CNS vasculitis and other small-vessel diseases. Electronically Signed   By: Genevie Ann M.D.   On: 09/08/2022 10:36   DG Chest 1 View  Result Date: 09/08/2022 CLINICAL DATA:  Weakness.  Altered mental status. EXAM: CHEST  1 VIEW COMPARISON:  04/11/2017 FINDINGS: Trace atelectasis noted at the lung bases. Lungs otherwise clear. The cardiopericardial silhouette is within normal limits for size. Loop recorder evident. The visualized bony structures of the thorax are unremarkable. IMPRESSION: Trace bibasilar atelectasis. Electronically Signed   By: Misty Stanley M.D.   On: 09/08/2022 07:48      Subjective: Patient denies any complaints.   Discharge Exam: Vitals:   09/10/22 0551 09/10/22 0600 09/10/22 0827 09/10/22 1231  BP:   95/74 103/71  Pulse:   78 63  Resp: _0 Temp:   98.2 F (36.8 C) 98.1 F (36.7 C)  TempSrc:   Oral Oral  SpO2:   100% 100%  Height:        General: Pt is  alert, awake, not in acute distress Cardiovascular: RRR, S1/S2 +, no rubs, no gallops Respiratory: CTA bilaterally, no wheezing, no rhonchi Abdominal: Soft, NT, ND, bowel sounds + Extremities: no edema, no cyanosis    The results of significant diagnostics from this hospitalization (including imaging, microbiology, ancillary and laboratory) are listed below for reference.     Microbiology: No results found for this or any previous visit (from the past 240 hour(s)).   Labs: BNP (last 3 results) No results for input(s): "BNP" in the last 8760 hours. Basic Metabolic Panel: Recent Labs  Lab 09/07/22 2140 09/08/22 0300 09/09/22 0653  NA 142 140 139  K 3.5 4.1 3.6  CL 104 104 104  CO2 _1 GLUCOSE 92 94 80  BUN _2 CREATININE 1.29* 1.18 1.14  CALCIUM 8.8* 8.6* 8.6*  Liver Function Tests: Recent Labs  Lab 09/07/22 2140 09/08/22 0300  AST 24 35  ALT 17 16  ALKPHOS 74 74  BILITOT 1.2 1.5*  PROT 6.3* 6.1*  ALBUMIN 3.2* 3.1*   No results for input(s): "LIPASE", "AMYLASE" in the last 168 hours. No results for input(s): "AMMONIA" in the last 168 hours. CBC: Recent Labs  Lab 09/07/22 2029 09/08/22 0300 09/09/22 0653  WBC 16.1* 10.7* 7.9  NEUTROABS 13.6*  --   --   HGB 12.4* 12.0* 12.8*  HCT 34.2* 33.7* 36.7*  MCV 93.4 94.7 95.1  PLT 172 129* 149*   Cardiac Enzymes: No results for input(s): "CKTOTAL", "CKMB", "CKMBINDEX", "TROPONINI" in the last 168 hours. BNP: Invalid input(s): "POCBNP" CBG: No results for input(s): "GLUCAP" in the last 168 hours. D-Dimer No results for input(s): "DDIMER" in the last 72 hours. Hgb A1c Recent Labs    09/08/22 0300  HGBA1C 5.6   Lipid Profile Recent Labs    09/08/22 0300  CHOL 76  HDL 30*  LDLCALC 35  TRIG 55  CHOLHDL 2.5   Thyroid function studies No results for input(s): "TSH", "T4TOTAL", "T3FREE", "THYROIDAB" in the last 72 hours.  Invalid input(s): "FREET3" Anemia work up Recent Labs     09/09/22 0653  VITAMINB12 174*   Urinalysis    Component Value Date/Time   COLORURINE YELLOW 05/05/2011 [place] 05/05/2011 1352   LABSPEC <=1.005 11/30/2017 1030   PHURINE 6.0 11/30/2017 1030   GLUCOSEU NEGATIVE 11/30/2017 1030   [place] 11/30/2017 1030   BILIRUBINUR negative 07/10/2021 1202   BILIRUBINUR neg 06/11/2020 0901   KETONESUR negative 07/10/2021 [place] 11/30/2017 1030   PROTEINUR Negative 06/11/2020 0901   PROTEINUR NEGATIVE 11/30/2017 1030   UROBILINOGEN 0.2 07/10/2021 1202   UROBILINOGEN 0.2 11/30/2017 1030   NITRITE Negative 07/10/2021 1202   NITRITE neg 06/11/2020 0901   NITRITE NEGATIVE 11/30/2017 1030   LEUKOCYTESUR Small (1+) (A) 07/10/2021 1202   Sepsis Labs Recent Labs  Lab 09/07/22 2029 09/08/22 0300 09/09/22 0653  WBC 16.1* 10.7* 7.9   Microbiology No results found for this or any previous visit (from the past 240 hour(s)).   Time coordinating discharge: 28mns  SIGNED:   JKathie Dike MD  [facility] 09/10/2022, 1:40 PM   If 7PM-7AM, please contact night-coverage [email or web address]

## 2022-09-12 LAB — ANTIPHOSPHOLIPID SYNDROME EVAL, BLD
Anticardiolipin IgA: <9 APL U/mL (ref 0–11)
Anticardiolipin IgG: <9 GPL U/mL (ref 0–14)
Anticardiolipin IgM: 25 MPL U/mL — ABNORMAL HIGH (ref 0–12)
DRVVT: 38.8 s (ref 0.0–47.0)
PTT Lupus Anticoagulant: 33.9 s (ref 0.0–43.5)
Phosphatydalserine, IgA: <1 APS Units (ref 0–19)
Phosphatydalserine, IgG: <9 Units (ref 0–30)
Phosphatydalserine, IgM: 47 Units — ABNORMAL HIGH (ref 0–30)

## 2022-09-13 LAB — ANCA TITERS
Atypical P-ANCA titer: <1:20 {titer}
C-ANCA: <1:20 {titer}
P-ANCA: <1:20 {titer}

## 2022-09-13 LAB — METHYLMALONIC ACID, SERUM: Methylmalonic Acid, Quantitative: 201 nmol/L (ref 0–378)

## 2022-09-15 LAB — VITAMIN B1: Vitamin B1 (Thiamine): 85.8 nmol/L (ref 66.5–200.0)

## 2022-09-16 NOTE — Progress Notes (Signed)
Kindly inform the patient that some of the blood work for abnormal clotting was slightly abnormal but this could be acute reaction to his strokes and I would like to repeat his blood work upon his follow-up visit with me in the office to see if this finding is real or just related to the stroke

## 2022-09-20 ENCOUNTER — Telehealth: Payer: Self-pay

## 2022-09-20 NOTE — Telephone Encounter (Signed)
Pt has not been scheduled with our office for [facility]

## 2022-09-20 NOTE — Telephone Encounter (Signed)
Pt verified by name and DOB,  normal results given per provider, Sister voiced understanding all question answered.

## 2022-09-20 NOTE — Telephone Encounter (Signed)
-----   Message from Micki Riley, MD sent at 09/16/2022  4:45 PM EST ----- Kindly inform the patient that some of the blood work for abnormal clotting was slightly abnormal but this could be acute reaction to his strokes and I would like to repeat his blood work upon his follow-up visit with me in the office to see if this finding is real or just related to the stroke

## 2022-09-20 NOTE — Telephone Encounter (Signed)
-----   Message from Pramod S Sethi, MD sent at 09/16/2022  4:45 PM EST ----- Kindly inform the patient that some of the blood work for abnormal clotting was slightly abnormal but this could be acute reaction to his strokes and I would like to repeat his blood work upon his follow-up visit with me in the office to see if this finding is real or just related to the stroke 

## 2022-10-13 ENCOUNTER — Ambulatory Visit: Payer: Medicare Other | Admitting: Nurse Practitioner

## 2022-11-08 ENCOUNTER — Ambulatory Visit (INDEPENDENT_AMBULATORY_CARE_PROVIDER_SITE_OTHER): Payer: Medicare Other | Admitting: Neurology

## 2022-11-08 ENCOUNTER — Encounter: Payer: Self-pay | Admitting: Neurology

## 2022-11-08 ENCOUNTER — Other Ambulatory Visit: Payer: Self-pay

## 2022-11-08 VITALS — BP 127/66 | HR 74 | Ht 66.0 in

## 2022-11-08 DIAGNOSIS — I639 Cerebral infarction, unspecified: Secondary | ICD-10-CM

## 2022-11-08 DIAGNOSIS — G9349 Other encephalopathy: Secondary | ICD-10-CM | POA: Diagnosis not present

## 2022-11-08 DIAGNOSIS — F0150 Vascular dementia without behavioral disturbance: Secondary | ICD-10-CM | POA: Diagnosis not present

## 2022-11-08 DIAGNOSIS — G1229 Other motor neuron disease: Secondary | ICD-10-CM

## 2022-11-08 MED ORDER — CITALOPRAM HYDROBROMIDE 10 MG PO TABS
10.0000 mg | ORAL_TABLET | Freq: Every day | ORAL | 1 refills | Status: DC
  Filled 2022-11-08: qty 30, 30d supply, fill #0

## 2022-11-08 MED ORDER — DONEPEZIL HCL 10 MG PO TABS
ORAL_TABLET | ORAL | 3 refills | Status: DC
  Filled 2022-11-08: qty 30, 44d supply, fill #0

## 2022-11-08 NOTE — Patient Instructions (Addendum)
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 I also advised the patient to eat a healthy diet with plenty of whole grains, cereals, fruits and vegetables, exercise regularly and maintain ideal body weight.  Recommend continue ongoing physical occupational and speech therapy.  Check repeat Lyme titer and anticardiolipin antibodies notch mutation testing for CADASIL.  Start Celexa 10 mg daily for his depression and Aricept 5 mg daily for a month to be increased to 10 mg daily if tolerated dementia.  Followup in the future with my nurse practitioner in 3 months or call earlier if necessary.  Stroke Prevention Some medical conditions and behaviors can lead to a higher chance of having a stroke. You can help prevent a stroke by eating healthy, exercising, not smoking, and managing any medical conditions you have. Stroke is a leading cause of functional impairment. Primary prevention is particularly important because a majority of strokes are first-time events. Stroke changes the lives of not only those who experience a stroke but also their family and other caregivers. How can this condition affect me? A stroke is a medical emergency and should be treated right away. A stroke can lead to brain damage and can sometimes be life-threatening. If a person gets medical treatment right away, there is a better chance of surviving and recovering from a stroke. What can increase my risk? The following medical conditions may increase your risk of a  stroke: Cardiovascular disease. High blood pressure (hypertension). Diabetes. High cholesterol. Sickle cell disease. Blood clotting disorders (hypercoagulable state). Obesity. Sleep disorders (obstructive sleep apnea). Other risk factors include: Being older than age 25. Having a history of blood clots, stroke, or mini-stroke (transient ischemic attack, TIA). Genetic factors, such as race, ethnicity, or a family history of stroke. Smoking cigarettes or using other tobacco products. Taking birth control pills, especially if you also use tobacco. Heavy use of alcohol or drugs, especially cocaine and methamphetamine. Physical inactivity. What actions can I take to prevent this? Manage your health conditions High cholesterol levels. Eating a healthy diet is important for preventing high cholesterol. If cholesterol cannot be managed through diet alone, you may need to take medicines. Take any prescribed medicines to control your cholesterol as told by your health care provider. Hypertension. To reduce your risk of stroke, try to keep your blood pressure below 130/80. Eating a healthy diet and exercising regularly are important for controlling blood pressure. If these steps are not enough to manage your blood pressure, you may need to take medicines. Take any prescribed medicines to control hypertension as told by your health care provider. Ask your health care provider if you should monitor your blood pressure at home. Have your blood pressure checked every year, even if your blood pressure is normal. Blood pressure increases with age and some medical conditions. Diabetes. Eating a healthy diet and exercising regularly are important parts of managing your blood sugar (glucose). If your blood sugar cannot be managed through diet and exercise, you may need to take medicines. Take any prescribed medicines to control your diabetes as told by your health care provider. Get evaluated for  obstructive sleep apnea.  Talk to your health care provider about getting a sleep evaluation if you snore a lot or have excessive sleepiness. Make sure that any other medical conditions you have, such as atrial fibrillation or atherosclerosis, are managed. Nutrition Follow instructions from your health care provider about what to eat or drink to help manage your health condition. These instructions may include: Reducing your daily calorie intake. Limiting how much salt (sodium) you use to 1,500 milligrams (mg) each day. Using only healthy fats for cooking, such as olive oil, canola oil, or sunflower oil. Eating healthy foods. You can do this by: Choosing foods that are high in fiber, such as whole grains, and fresh fruits and vegetables. Eating at least 5 servings of fruits and vegetables a day. Try to fill one-half of your plate with fruits and vegetables at each meal. Choosing lean protein foods, such as lean cuts of meat, poultry without skin, fish, tofu, beans, and nuts. Eating low-fat dairy products. Avoiding foods that are high in sodium. This can help lower blood pressure. Avoiding foods that have saturated fat, trans fat, and cholesterol. This can help prevent high cholesterol. Avoiding processed and prepared foods. Counting your daily carbohydrate intake.  Lifestyle If you drink alcohol: Limit how much you have to: 0-1 drink a day for women who are not pregnant. 0-2 drinks a day for men. Know how much alcohol is in your drink. In the [place] one drink equals one 12 oz bottle of beer ( ), one 5 oz glass of wine ( ), or one 1 oz glass of hard liquor (64mL). Do not use any products that contain nicotine or tobacco. These products include cigarettes, chewing tobacco, and vaping devices, such as e-cigarettes. If you need help quitting, ask your health care provider. Avoid secondhand smoke. Do not use drugs. Activity  Try to stay at a healthy weight. Get at least 30 minutes of  exercise on most days, such as: Fast walking. Biking. Swimming. Medicines Take over-the-counter and prescription medicines only as told by your health care provider. Aspirin or blood thinners (antiplatelets or anticoagulants) may be recommended to reduce your risk of forming blood clots that can lead to stroke. Avoid taking birth control pills. Talk to your health care provider about the risks of taking birth control pills if: You are over 1 years old. You smoke. You get very bad headaches. You have had a blood clot. Where to find more information American Stroke Association: www.strokeassociation.org Get help right away if: You or a loved one has any symptoms of a stroke. "BE FAST" is an easy way to remember the main warning signs of a stroke: B - Balance. Signs are dizziness, sudden trouble walking, or loss of balance. E - Eyes. Signs are trouble seeing or a sudden change in vision. F - Face. Signs are sudden weakness or numbness of the face, or the face or eyelid drooping on one side. A - Arms. Signs are weakness or numbness in an arm. This happens suddenly and usually on one side of the body. S - Speech. Signs are sudden trouble speaking, slurred speech, or trouble understanding what people say. T - Time. Time to call emergency services. Write down what time symptoms started. You or a loved one has other signs of a stroke, such as: A sudden, severe headache with no known cause. Nausea or vomiting. Seizure. These symptoms may represent a serious problem that is an emergency. Do not wait to see if the symptoms will go away. Get medical help right  away. Call your local emergency services (911 in the [place] Do not drive yourself to the hospital. Summary You can help to prevent a stroke by eating healthy, exercising, not smoking, limiting alcohol intake, and managing any medical conditions you may have. Do not use any products that contain nicotine or tobacco. These include cigarettes,  chewing tobacco, and vaping devices, such as e-cigarettes. If you need help quitting, ask your health care provider. Remember "BE FAST" for warning signs of a stroke. Get help right away if you or a loved one has any of these signs. This information is not intended to replace advice given to you by your health care provider. Make sure you discuss any questions you have with your health care provider. Document Revised: 05/19/2020 Document Reviewed: 05/19/2020 Elsevier Patient Education  [place].

## 2022-11-08 NOTE — Progress Notes (Signed)
[facility] [address] [place]. [place] [address] [phone]       OFFICE FOLLOW-UP NOTE  Daniel. Daniel Stuart Date of Birth:  12-28-1965 Medical Record Number:  [id number]   HPI:Daniel Stuart is a 57 year african American male seen today for office f/u visit after hospital consult for stroke in November 2023.  History is obtained from the patient and review of electronic medical records and I personally reviewed pertinent available imaging films in PACS.  He has past medical history of hyperlipidemia, peripheral vascular disease, chronic left carotid occlusion, tobacco abuse, multifocal strokes of cryptogenic 57 2018 status post loop recorder placement.  He was admitted in 09/09/2022 with subacute progressive decline in his gait and cognition with increasing falls.  MRI scan of the brain cervical thoracic and lumbar spine were obtained which showed normal significant abnormalities except mild multifocal punctate acute bilateral cortical and subcortical infarcts.  There is also significant extensive progressive bilateral supratentorial white matter disease and associated atrophy of corpus callosum and cortex along with changes of superficial siderosis raising concerns for territory small vessel disease etiologies like MELAS,CADASIL, CNS vasculitis or other small vessel disease.  CT angiogram showed chronic occlusion of left internal carotid artery at the bulb with reconstitution in the ophthalmic segment no other large vessel disease.  Carotid ultrasound was unremarkable.  Transcranial Doppler study was negative for right-to-left shunt.  Echocardiogram showed ejection fraction of 55 to 60% without cardiac source of embolism.  Patient had a loop recorder inserted following his previous stroke in 2018 and paroxysmal A-fib was not found despite more than 3 years of monitoring.  Hypercoagulable panel labs were significant only for mildly elevated IgM anticardiolipin antibody of 25 and IgM  glycoprotein of 47 which were felt to be acute phase reactants.  IgM Lyme antibodies were also detected but patient denies any history of tick bite, rash, having pets with ticks or  living in a wooded area.  There is no family history of strokes at a young age or migraines.  Patient has no prior history of migraines either.  Labs for vasculitis was negative.  Vitamin D levels are slightly low and he was started on replacement.  Vitamin B12 and TSH were normal.  Hepatitis C antibody was negative.  Vasculitic labs were negative. Patient is currently in skilled nursing facility.  He is getting daily physical occupational and speech therapy.  He however has not noticed improvement in his gait and balance.  He can get up with support and stand but is unable to walk he feels his legs give out and his balance is poor.  He states his speech is improved but his short-term memory and cognitive abilities remain poor.  He denies feeling depressed but on depression scale he tested positive for depression and mental status exam today scored 15/30. ROS:   14 system review of systems is positive for weakness, difficulty walking, imbalance, memory loss and all other systems negative PMH:  Past Medical History:  Diagnosis Date   Anxiety    Arrhythmia as indication for cardiac pacemaker replacement 03/2020   Frequent falls 03/2020   History of loop recorder 04/2017   Stroke (cerebrum) (HCC)    Tobacco use    Urinary frequency 09/2019   Vitamin D deficiency     Social History:  Social History   Socioeconomic History   Marital status: Legally Separated    Spouse name: Not on file   Number of children: 1   Years of  education: Not on file   Highest education level: Some college, no degree  Occupational History   Occupation: disabled  Tobacco Use   Smoking status: Every Day    Packs/day: 1.00    Types: Cigarettes    Start date: 11/01/1977   Smokeless tobacco: Never  Vaping Use   Vaping Use: Never used   Substance and Sexual Activity   Alcohol use: No   Drug use: No   Sexual activity: Not Currently    Partners: Female  Other Topics Concern   Not on file  Social History Narrative   Patient is right-handed. He lives in a one level home alone  since. He drinks one cup of coffee and 2-3 sodas a day.   Social Determinants of Health   Financial Resource Strain: Medium Risk (07/31/2021)   Overall Financial Resource Strain (CARDIA)    Difficulty of Paying Living Expenses: Somewhat hard  Food Insecurity: No Food Insecurity (07/31/2021)   Hunger Vital Sign    Worried About Running Out of Food in the Last Year: Never true    Ran Out of Food in the Last Year: Never true  Transportation Needs: No Transportation Needs (07/31/2021)   PRAPARE - Administrator, Civil Service (Medical): No    Lack of Transportation (Non-Medical): No  Physical Activity: Inactive (07/31/2021)   Exercise Vital Sign    Days of Exercise per Week: 0 days    Minutes of Exercise per Session: 0 min  Stress: No Stress Concern Present (07/31/2021)   [employer] of Occupational Health - Occupational Stress Questionnaire    Feeling of Stress : Not at all  Social Connections: Socially Isolated (07/31/2021)   Social Connection and Isolation Panel [NHANES]    Frequency of Communication with Friends and Family: Three times a week    Frequency of Social Gatherings with Friends and Family: Never    Attends Religious Services: Never    Database administrator or Organizations: No    Attends Banker Meetings: Never    Marital Status: Separated  Intimate Partner Violence: Not At Risk (07/31/2021)   Humiliation, Afraid, Rape, and Kick questionnaire    Fear of Current or Ex-Partner: No    Emotionally Abused: No    Physically Abused: No    Sexually Abused: No    Medications:   Current Outpatient Medications on File Prior to Visit  Medication Sig Dispense Refill   acetaminophen (TYLENOL) 500 MG tablet  Take 1,000 mg by mouth every 6 (six) hours as needed for mild pain.     aspirin 81 MG EC tablet Take 1 tablet (81 mg total) by mouth daily. 30 tablet 3   atorvastatin (LIPITOR) 80 MG tablet TAKE 1 TABLET (80 MG TOTAL) BY MOUTH EVERY EVENING. 90 tablet 3   cholecalciferol (VITAMIN D3) 25 MCG (1000 UNIT) tablet Take 1,000 Units by mouth daily.     clopidogrel (PLAVIX) 75 MG tablet Take 1 tablet (75 mg total) by mouth daily.     cyanocobalamin 1000 MCG tablet Take 1 tablet (1,000 mcg total) by mouth daily.     gabapentin (NEURONTIN) 300 MG capsule TAKE 1 CAPSULE BY MOUTH BY MOUTH TWO TIMES DAILY 180 capsule 3   KETOCONAZOLE, TOPICAL, 1 % SHAM Apply 1 Application topically 2 (two) times a week.     magnesium hydroxide (MILK OF MAGNESIA) 400 MG/5ML suspension Take 30 mLs by mouth daily as needed for mild constipation.     oxybutynin (DITROPAN-XL) 10 MG 24  hr tablet Take 10 mg by mouth at bedtime.     thiamine (VITAMIN B-1) 100 MG tablet Take 100 mg by mouth daily.     thiamine (VITAMIN B1) 100 MG tablet Take 1 tablet (100 mg total) by mouth daily.     No current facility-administered medications on file prior to visit.    Allergies:  No Known Allergies  Physical Exam General: Frail middle-aged African-American male, seated, in no evident distress Head: head normocephalic and atraumatic.  Neck: supple with no carotid or supraclavicular bruits Cardiovascular: regular rate and rhythm, no murmurs Musculoskeletal: no deformity Skin:  no rash/petichiae Vascular:  Normal pulses all extremities Vitals:   11/08/22 0942  BP: 127/66  Pulse: 74   Neurologic Exam Mental Status: Awake and fully alert. Oriented to place and time. Recent and remote memory poor attention span, concentration and fund of knowledge diminished. Mood and affect appropriate.  Mini-Mental status exam scored 15/30.  Clock drawing 1/4.  Able to name only 1 animals which can walk on 4 legs.  Geriatric depression scale score of 10  suggestive of depression. Cranial Nerves: Fundoscopic exam reveals sharp disc margins. Pupils equal, briskly white matter to light. Extraocular movements full without nystagmus but has slow saccades bilaterally.. Visual fields full to confrontation. Hearing intact. Facial sensation intact.  Mild right lower facial asymmetry when he smiles.  Tongue, palate moves normally and symmetrically.  Brisk jaw jerk. Motor: Normal bulk and tone. Normal strength in all tested extremity muscles except mild weakness of intrinsic hand muscles on the right.  Diminished fine finger movements on the right.  Orbits left or right extremity.  Mild spasticity in both lower extremities right greater than left..  Mild right greater than left ankle dorsiflexor weakness. Sensory.: intact to touch ,pinprick .position and vibratory sensation.  Coordination: Rapid alternating movements normal in all extremities. Finger-to-nose and heel-to-shin performed accurately bilaterally. Gait and Station: Deferred as patient is now wheelchair and unable to walk even with assistance..  Reflexes: 3+ and asymmetric and brisker on the right.  No clonus.. Toes downgoing.   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  He has significant underlying vascular dementia and  depression and gait difficulties.     PLAN:I had a long d/w patient about his recent strokes, progressive leukoencephalopathy on MRI pseudobulbar state and vascular dementia risk for recurrent stroke/TIAs, personally independently reviewed imaging studies and stroke evaluation results and answered questions.Continue aspirin 81 mg daily  for secondary stroke prevention and maintain strict control of hypertension with blood pressure goal below 130/90, diabetes with hemoglobin A1c goal below 6.5% and lipids with LDL cholesterol goal below 70 mg/dL. I also advised the patient to eat a healthy diet with plenty of whole grains, cereals, fruits and vegetables, exercise regularly and maintain ideal body weight.  Recommend continue ongoing physical occupational and speech therapy.  Check repeat Lyme titer and anticardiolipin antibodies  and notch 3 mutation testing for CADASIL.  Start Celexa 10 mg daily for his depression and Aricept 5 mg daily for a month to be increased to 10 mg daily if tolerated dementia.  He clearly has a progressive leukoencephalopathy on his MRI small vessel etiology send out lab test to find the exact theology but it is unlikely to be treatable and patient is presently in the nursing home and will be quite challenging to refer him to rare genetic  disorder clinic at [facility] to get a definite diagnosis at the present time.  Followup in the future with my nurse practitioner in 3 months or call earlier if necessary. Greater than 50% of time during this prolonged 45 minute visit was spent on counseling,explanation of diagnosis, planning of further management, discussion with patient and family and coordination of care Antony Contras, MD Note: This document was prepared with digital dictation and possible smart phrase technology. Any transcriptional errors that result from this process are unintentional

## 2022-11-10 ENCOUNTER — Telehealth: Payer: Self-pay

## 2022-11-10 NOTE — Progress Notes (Signed)
Kindly inform the patient that repeat test for Lyme`s disease   was negative Blood test results for abnormal clotting are not yet back

## 2022-11-10 NOTE — Telephone Encounter (Signed)
Called patient and spoke to sister per [facility] and relayed the lab results

## 2022-11-15 ENCOUNTER — Other Ambulatory Visit: Payer: Self-pay

## 2022-11-18 ENCOUNTER — Telehealth: Payer: Self-pay | Admitting: Neurology

## 2022-11-18 MED ORDER — DONEPEZIL HCL 10 MG PO TABS: ORAL_TABLET | ORAL | 3 refills | Status: DC

## 2022-11-18 NOTE — Telephone Encounter (Signed)
I have printed order for Dr. Krista Blue to sign since Dr. Leonie Man is working in the [facility] this week.

## 2022-11-18 NOTE — Telephone Encounter (Signed)
[facility]) need order for donepezil (ARICEPT) 10 MG tablet and all orders done by the physician on January 8. This is urgent, could you send over as soon as possible. Can call my direct line: [phone]

## 2022-11-18 NOTE — Telephone Encounter (Signed)
    I called Jasmine back and we discussed message. She needed to confirm both of the prescriptions from 11/08/2022 with Dr. Leonie Man. I relayed information as stated above and she verbalized understanding and appreciation for the call.

## 2022-11-19 LAB — ANTIPHOSPHOLIPID SYNDROME COMP
APTT: 25.5 s
Anticardiolipin Ab, IgA: <10 [APL'U]
Anticardiolipin Ab, IgG: <10 [GPL'U]
Anticardiolipin Ab, IgM: 50 [MPL'U] — ABNORMAL HIGH
Antiphosphatidylserine IgG: 6 {GPS'U}
Antiphosphatidylserine IgM: 26 {MPS'U} — ABNORMAL HIGH
Antiprothrombin Antibody, IgG: 1 G units
Beta-2 Glycoprotein I, IgA: <10 SAU
Beta-2 Glycoprotein I, IgG: <10 SGU
Beta-2 Glycoprotein I, IgM: 15 SMU
DRVVT Screen Seconds: 34.3 s
Hexagonal Phospholipid Neutral: 3 s
Platelet Neutralization: 0.1 s

## 2022-11-19 LAB — LYME DISEASE SEROLOGY W/REFLEX: Lyme Total Antibody EIA: NEGATIVE

## 2022-11-21 NOTE — Progress Notes (Signed)
Kindly inform the patient that blood test for abnormal clotting -antiphospholipid antibody is weakly positive and this is a finding of unclear significance I recommend repeat testing in 2 to 3 months.

## 2022-11-22 ENCOUNTER — Telehealth: Payer: Self-pay

## 2022-11-22 NOTE — Telephone Encounter (Signed)
-----  Message from Garvin Fila, MD sent at 11/21/2022  6:25 PM EST ----- Kindly inform the patient that blood test for abnormal clotting -antiphospholipid antibody is weakly positive and this is a finding of unclear significance I recommend repeat testing in 2 to 3 months.

## 2022-11-22 NOTE — Telephone Encounter (Signed)
Called and LVM regarding results per DPR.

## 2022-12-16 ENCOUNTER — Telehealth: Payer: Self-pay

## 2022-12-16 NOTE — Telephone Encounter (Signed)
Called pt to schedule a follow up appt for power wheel chair. When pt calls back add appt note the forms are at my desk. [place]

## 2023-02-15 NOTE — Progress Notes (Deleted)
No chief complaint on file.   HISTORY OF PRESENT ILLNESS:  02/15/23 ALL:  Daniel Stuart is a 57 y.o. male here today for follow up for cryptogenic CVA in 09/2022. He was last seen by Dr Pearlean Brownie 11/2022. He was started on citalopram and donepezil for concerns of depression and cognitive impairments. Antiphospholipid ab low positive.    SNF?  He continues asa 81mg  and atorvastatin 80mg  daily.   PT/OT/ST?   HISTORY (copied from Dr Marlis Edelson previous note)  HPI:Daniel Stuart is a 29 year african American male seen today for office f/u visit after hospital consult for stroke in November 2023.  History is obtained from the patient and review of electronic medical records and I personally reviewed pertinent available imaging films in PACS.  He has past medical history of hyperlipidemia, peripheral vascular disease, chronic left carotid occlusion, tobacco abuse, multifocal strokes of cryptogenic 47 2018 status post loop recorder placement.  He was admitted in 09/09/2022 with subacute progressive decline in his gait and cognition with increasing falls.  MRI scan of the brain cervical thoracic and lumbar spine were obtained which showed normal significant abnormalities except mild multifocal punctate acute bilateral cortical and subcortical infarcts.  There is also significant extensive progressive bilateral supratentorial white matter disease and associated atrophy of corpus callosum and cortex along with changes of superficial siderosis raising concerns for territory small vessel disease etiologies like MELAS,CADASIL, CNS vasculitis or other small vessel disease.  CT angiogram showed chronic occlusion of left internal carotid artery at the bulb with reconstitution in the ophthalmic segment no other large vessel disease.  Carotid ultrasound was unremarkable.  Transcranial Doppler study was negative for right-to-left shunt.  Echocardiogram showed ejection fraction of 55 to 60% without cardiac source of  embolism.  Patient had a loop recorder inserted following his previous stroke in 2018 and paroxysmal A-fib was not found despite more than 3 years of monitoring.  Hypercoagulable panel labs were significant only for mildly elevated IgM anticardiolipin antibody of 25 and IgM glycoprotein of 47 which were felt to be acute phase reactants.  IgM Lyme antibodies were also detected but patient denies any history of tick bite, rash, having pets with ticks or  living in a wooded area.  There is no family history of strokes at a young age or migraines.  Patient has no prior history of migraines either.  Labs for vasculitis was negative.  Vitamin D levels are slightly low and he was started on replacement.  Vitamin B12 and TSH were normal.  Hepatitis C antibody was negative.  Vasculitic labs were negative.  Patient is currently in skilled nursing facility.  He is getting daily physical occupational and speech therapy.  He however has not noticed improvement in his gait and balance.  He can get up with support and stand but is unable to walk he feels his legs give out and his balance is poor.  He states his speech is improved but his short-term memory and cognitive abilities remain poor.  He denies feeling depressed but on depression scale he tested positive for depression and mental status exam today scored 15/30.   REVIEW OF SYSTEMS: Out of a complete 14 system review of symptoms, the patient complains only of the following symptoms, imbalance, depression, memory loss, and all other reviewed systems are negative.   ALLERGIES: No Known Allergies   HOME MEDICATIONS: Outpatient Medications Prior to Visit  Medication Sig Dispense Refill   acetaminophen (TYLENOL) 500 MG tablet Take 1,000 mg  by mouth every 6 (six) hours as needed for mild pain.     aspirin 81 MG EC tablet Take 1 tablet (81 mg total) by mouth daily. 30 tablet 3   atorvastatin (LIPITOR) 80 MG tablet TAKE 1 TABLET (80 MG TOTAL) BY MOUTH EVERY  EVENING. 90 tablet 3   cholecalciferol (VITAMIN D3) 25 MCG (1000 UNIT) tablet Take 1,000 Units by mouth daily.     citalopram (CELEXA) 10 MG tablet Take 1 tablet (10 mg total) by mouth daily. 30 tablet 1   clopidogrel (PLAVIX) 75 MG tablet Take 1 tablet (75 mg total) by mouth daily.     cyanocobalamin 1000 MCG tablet Take 1 tablet (1,000 mcg total) by mouth daily.     donepezil (ARICEPT) 10 MG tablet Take 0.5 tablets (5 mg total) by mouth at bedtime for 28 days, THEN 1 tablet (10 mg total) at bedtime for 16 days. 30 tablet 3   gabapentin (NEURONTIN) 300 MG capsule TAKE 1 CAPSULE BY MOUTH BY MOUTH TWO TIMES DAILY 180 capsule 3   KETOCONAZOLE, TOPICAL, 1 % SHAM Apply 1 Application topically 2 (two) times a week.     magnesium hydroxide (MILK OF MAGNESIA) 400 MG/5ML suspension Take 30 mLs by mouth daily as needed for mild constipation.     oxybutynin (DITROPAN-XL) 10 MG 24 hr tablet Take 10 mg by mouth at bedtime.     thiamine (VITAMIN B-1) 100 MG tablet Take 100 mg by mouth daily.     thiamine (VITAMIN B1) 100 MG tablet Take 1 tablet (100 mg total) by mouth daily.     No facility-administered medications prior to visit.     PAST MEDICAL HISTORY: Past Medical History:  Diagnosis Date   Anxiety    Arrhythmia as indication for cardiac pacemaker replacement 03/2020   Frequent falls 03/2020   History of loop recorder 04/2017   Stroke (cerebrum) (HCC)    Tobacco use    Urinary frequency 09/2019   Vitamin D deficiency      PAST SURGICAL HISTORY: Past Surgical History:  Procedure Laterality Date   FOOT SURGERY     LOOP RECORDER INSERTION N/A 04/13/2017   Procedure: Loop Recorder Insertion;  Surgeon: Marinus Maw, MD;  Location: [facility] INVASIVE CV [facility];  Service: Cardiovascular;  Laterality: N/A;   TEE WITHOUT CARDIOVERSION N/A 04/13/2017   Procedure: TRANSESOPHAGEAL ECHOCARDIOGRAM (TEE);  Surgeon: Jake Bathe, MD;  Location: [facility] ENDOSCOPY;  Service: Cardiovascular;  Laterality: N/A;      FAMILY HISTORY: Family History  Problem Relation Age of Onset   Diabetes Mother    COPD Mother    Diabetes Father    Heart failure Father    Cancer Sister      SOCIAL HISTORY: Social History   Socioeconomic History   Marital status: Legally Separated    Spouse name: Not on file   Number of children: 1   Years of education: Not on file   Highest education level: Some college, no degree  Occupational History   Occupation: disabled  Tobacco Use   Smoking status: Every Day    Packs/day: 1    Types: Cigarettes    Start date: 11/01/1977   Smokeless tobacco: Never  Vaping Use   Vaping Use: Never used  Substance and Sexual Activity   Alcohol use: No   Drug use: No   Sexual activity: Not Currently    Partners: Female  Other Topics Concern   Not on file  Social History Narrative   Patient  is right-handed. He lives in a one level home alone  since. He drinks one cup of coffee and 2-3 sodas a day.   Social Determinants of Health   Financial Resource Strain: Medium Risk (07/31/2021)   Overall Financial Resource Strain (CARDIA)    Difficulty of Paying Living Expenses: Somewhat hard  Food Insecurity: No Food Insecurity (07/31/2021)   Hunger Vital Sign    Worried About Running Out of Food in the Last Year: Never true    Ran Out of Food in the Last Year: Never true  Transportation Needs: No Transportation Needs (07/31/2021)   PRAPARE - Administrator, Civil Service (Medical): No    Lack of Transportation (Non-Medical): No  Physical Activity: Inactive (07/31/2021)   Exercise Vital Sign    Days of Exercise per Week: 0 days    Minutes of Exercise per Session: 0 min  Stress: No Stress Concern Present (07/31/2021)   [employer] of Occupational Health - Occupational Stress Questionnaire    Feeling of Stress : Not at all  Social Connections: Socially Isolated (07/31/2021)   Social Connection and Isolation Panel [NHANES]    Frequency of Communication with Friends  and Family: Three times a week    Frequency of Social Gatherings with Friends and Family: Never    Attends Religious Services: Never    Database administrator or Organizations: No    Attends Banker Meetings: Never    Marital Status: Separated  Intimate Partner Violence: Not At Risk (07/31/2021)   Humiliation, Afraid, Rape, and Kick questionnaire    Fear of Current or Ex-Partner: No    Emotionally Abused: No    Physically Abused: No    Sexually Abused: No     PHYSICAL EXAM  There were no vitals filed for this visit. There is no height or weight on file to calculate BMI.  Generalized: Well developed, in no acute distress  Cardiology: normal rate and rhythm, no murmur auscultated  Respiratory: clear to auscultation bilaterally    Neurological examination  Mentation: Alert oriented to time, place, history taking. Follows all commands speech and language fluent Cranial nerve II-XII: Pupils were equal round reactive to light. Extraocular movements were full, visual field were full on confrontational test. Facial sensation and strength were normal. Uvula tongue midline. Head turning and shoulder shrug  were normal and symmetric. Motor: The motor testing reveals 5 over 5 strength of all 4 extremities. Good symmetric motor tone is noted throughout.  Sensory: Sensory testing is intact to soft touch on all 4 extremities. No evidence of extinction is noted.  Coordination: Cerebellar testing reveals good finger-nose-finger and heel-to-shin bilaterally.  Gait and station: Gait is normal. Tandem gait is normal. Romberg is negative. No drift is seen.  Reflexes: Deep tendon reflexes are symmetric and normal bilaterally.    DIAGNOSTIC DATA (LABS, IMAGING, TESTING) - I reviewed patient records, labs, notes, testing and imaging myself where available.  Lab Results  Component Value Date   WBC 7.9 09/09/2022   HGB 12.8 (L) 09/09/2022   HCT 36.7 (L) 09/09/2022   MCV 95.1 09/09/2022    PLT 149 (L) 09/09/2022      Component Value Date/Time   NA 139 09/09/2022 0653   NA 140 01/11/2022 1121   K 3.6 09/09/2022 0653   CL 104 09/09/2022 0653   CO2 25 09/09/2022 0653   GLUCOSE 80 09/09/2022 0653   BUN 7 09/09/2022 0653   BUN 5 (L) 01/11/2022 1121   CREATININE  1.14 09/09/2022 0653   CREATININE 1.24 04/18/2017 1012   CALCIUM 8.6 (L) 09/09/2022 0653   PROT 6.1 (L) 09/08/2022 0300   PROT 7.6 01/11/2022 1121   ALBUMIN 3.1 (L) 09/08/2022 0300   ALBUMIN 4.2 01/11/2022 1121   AST 35 09/08/2022 0300   ALT 16 09/08/2022 0300   ALKPHOS 74 09/08/2022 0300   BILITOT 1.5 (H) 09/08/2022 0300   BILITOT 0.4 01/11/2022 1121   GFRNONAA >60 09/09/2022 0653   GFRNONAA 66 04/18/2017 1012   GFRAA 80 06/11/2020 1041   GFRAA 76 04/18/2017 1012   Lab Results  Component Value Date   CHOL 76 09/08/2022   HDL 30 (L) 09/08/2022   LDLCALC 35 09/08/2022   TRIG 55 09/08/2022   CHOLHDL 2.5 09/08/2022   Lab Results  Component Value Date   HGBA1C 5.6 09/08/2022   Lab Results  Component Value Date   VITAMINB12 174 (L) 09/09/2022   Lab Results  Component Value Date   TSH 1.340 01/11/2022       11/08/2022   10:16 AM  MMSE - Mini Mental State Exam  Orientation to time 2  Orientation to Place 3  Registration 3  Attention/ Calculation 0  Recall 2  Language- name 2 objects 1  Language- repeat 1  Language- follow 3 step command 2  Language- read & follow direction 1  Write a sentence 0  Write a sentence-comments unable  Copy design 0  Copy design-comments unable  Total score 15         No data to display           ASSESSMENT AND PLAN  57 y.o. year old male  has a past medical history of Anxiety, Arrhythmia as indication for cardiac pacemaker replacement (03/2020), Frequent falls (03/2020), History of loop recorder (04/2017), Stroke (cerebrum) (HCC), Tobacco use, Urinary frequency (09/2019), and Vitamin D deficiency. here with    No diagnosis found.  Larina Earthly ***.  Healthy lifestyle habits encouraged. *** will follow up with PCP as directed. *** will return to see me in ***, sooner if needed. *** verbalizes understanding and agreement with this plan.   No orders of the defined types were placed in this encounter.    No orders of the defined types were placed in this encounter.    Shawnie Dapper, MSN, [name]-C 02/15/2023, 11:25 AM  [facility] [address], [address] [place], [place] [address] [phone]

## 2023-02-16 ENCOUNTER — Ambulatory Visit: Payer: Medicare Other | Admitting: Family Medicine

## 2023-02-16 ENCOUNTER — Encounter: Payer: Self-pay | Admitting: Family Medicine

## 2023-02-16 DIAGNOSIS — R760 Raised antibody titer: Secondary | ICD-10-CM

## 2023-02-16 DIAGNOSIS — F0150 Vascular dementia without behavioral disturbance: Secondary | ICD-10-CM

## 2023-02-16 DIAGNOSIS — I639 Cerebral infarction, unspecified: Secondary | ICD-10-CM

## 2023-02-25 ENCOUNTER — Ambulatory Visit: Payer: Medicare Other | Admitting: Nurse Practitioner

## 2023-03-01 ENCOUNTER — Telehealth: Payer: Self-pay | Admitting: Nurse Practitioner

## 2023-03-01 NOTE — Telephone Encounter (Signed)
Called patient to schedule Medicare Annual Wellness Visit (AWV). No voicemail available to leave a message.  Last date of AWV: Medicare AWVl: 07/31/2021   Please schedule an appointment at any time with Abby, NHA. .  If any questions, please contact me at [phone].  Thank you,  Judeth Cornfield,  AMB Clinical Support [facility] AWV Program Direct Dial ??[phone]

## 2023-06-19 ENCOUNTER — Encounter (HOSPITAL_COMMUNITY): Payer: Self-pay

## 2023-06-19 ENCOUNTER — Inpatient Hospital Stay (HOSPITAL_COMMUNITY): Payer: Medicare Other

## 2023-06-19 ENCOUNTER — Other Ambulatory Visit: Payer: Self-pay

## 2023-06-19 ENCOUNTER — Inpatient Hospital Stay (HOSPITAL_COMMUNITY)
Admission: EM | Admit: 2023-06-19 | Discharge: 2023-06-22 | DRG: 065 | Disposition: A | Discharge status: Skilled Nursing Facility | Payer: Medicare Other | Source: Skilled Nursing Facility | Attending: Internal Medicine | Admitting: Internal Medicine

## 2023-06-19 ENCOUNTER — Emergency Department (HOSPITAL_COMMUNITY): Payer: Medicare Other

## 2023-06-19 DIAGNOSIS — Z7901 Long term (current) use of anticoagulants: Secondary | ICD-10-CM | POA: Diagnosis not present

## 2023-06-19 DIAGNOSIS — Z8673 Personal history of transient ischemic attack (TIA), and cerebral infarction without residual deficits: Secondary | ICD-10-CM

## 2023-06-19 DIAGNOSIS — H548 Legal blindness, as defined in USA: Secondary | ICD-10-CM | POA: Diagnosis present

## 2023-06-19 DIAGNOSIS — I615 Nontraumatic intracerebral hemorrhage, intraventricular: Secondary | ICD-10-CM | POA: Diagnosis present

## 2023-06-19 DIAGNOSIS — Z79899 Other long term (current) drug therapy: Secondary | ICD-10-CM

## 2023-06-19 DIAGNOSIS — D6861 Antiphospholipid syndrome: Secondary | ICD-10-CM | POA: Diagnosis present

## 2023-06-19 DIAGNOSIS — Z825 Family history of asthma and other chronic lower respiratory diseases: Secondary | ICD-10-CM | POA: Diagnosis not present

## 2023-06-19 DIAGNOSIS — F419 Anxiety disorder, unspecified: Secondary | ICD-10-CM | POA: Diagnosis present

## 2023-06-19 DIAGNOSIS — I6522 Occlusion and stenosis of left carotid artery: Secondary | ICD-10-CM | POA: Diagnosis present

## 2023-06-19 DIAGNOSIS — H47619 Cortical blindness, unspecified side of brain: Secondary | ICD-10-CM | POA: Diagnosis present

## 2023-06-19 DIAGNOSIS — R4189 Other symptoms and signs involving cognitive functions and awareness: Secondary | ICD-10-CM | POA: Diagnosis present

## 2023-06-19 DIAGNOSIS — I6389 Other cerebral infarction: Secondary | ICD-10-CM

## 2023-06-19 DIAGNOSIS — I10 Essential (primary) hypertension: Secondary | ICD-10-CM | POA: Diagnosis present

## 2023-06-19 DIAGNOSIS — Z720 Tobacco use: Secondary | ICD-10-CM | POA: Diagnosis present

## 2023-06-19 DIAGNOSIS — Z8661 Personal history of infections of the central nervous system: Secondary | ICD-10-CM

## 2023-06-19 DIAGNOSIS — I619 Nontraumatic intracerebral hemorrhage, unspecified: Secondary | ICD-10-CM

## 2023-06-19 DIAGNOSIS — Z8249 Family history of ischemic heart disease and other diseases of the circulatory system: Secondary | ICD-10-CM | POA: Diagnosis not present

## 2023-06-19 DIAGNOSIS — G9389 Other specified disorders of brain: Secondary | ICD-10-CM | POA: Diagnosis present

## 2023-06-19 DIAGNOSIS — F17210 Nicotine dependence, cigarettes, uncomplicated: Secondary | ICD-10-CM | POA: Diagnosis present

## 2023-06-19 DIAGNOSIS — I739 Peripheral vascular disease, unspecified: Secondary | ICD-10-CM | POA: Diagnosis present

## 2023-06-19 DIAGNOSIS — H5702 Anisocoria: Secondary | ICD-10-CM | POA: Diagnosis present

## 2023-06-19 DIAGNOSIS — R29705 NIHSS score 5: Secondary | ICD-10-CM | POA: Diagnosis present

## 2023-06-19 DIAGNOSIS — G9349 Other encephalopathy: Secondary | ICD-10-CM | POA: Diagnosis present

## 2023-06-19 DIAGNOSIS — I612 Nontraumatic intracerebral hemorrhage in hemisphere, unspecified: Secondary | ICD-10-CM | POA: Diagnosis not present

## 2023-06-19 DIAGNOSIS — Z7902 Long term (current) use of antithrombotics/antiplatelets: Secondary | ICD-10-CM

## 2023-06-19 DIAGNOSIS — M79672 Pain in left foot: Secondary | ICD-10-CM | POA: Diagnosis present

## 2023-06-19 DIAGNOSIS — R296 Repeated falls: Secondary | ICD-10-CM | POA: Diagnosis present

## 2023-06-19 DIAGNOSIS — I779 Disorder of arteries and arterioles, unspecified: Secondary | ICD-10-CM | POA: Diagnosis present

## 2023-06-19 DIAGNOSIS — Z7982 Long term (current) use of aspirin: Secondary | ICD-10-CM

## 2023-06-19 DIAGNOSIS — I609 Nontraumatic subarachnoid hemorrhage, unspecified: Secondary | ICD-10-CM | POA: Diagnosis present

## 2023-06-19 DIAGNOSIS — Z833 Family history of diabetes mellitus: Secondary | ICD-10-CM

## 2023-06-19 DIAGNOSIS — Z993 Dependence on wheelchair: Secondary | ICD-10-CM

## 2023-06-19 DIAGNOSIS — R69 Illness, unspecified: Secondary | ICD-10-CM

## 2023-06-19 DIAGNOSIS — R29810 Facial weakness: Secondary | ICD-10-CM | POA: Diagnosis present

## 2023-06-19 DIAGNOSIS — I639 Cerebral infarction, unspecified: Secondary | ICD-10-CM | POA: Diagnosis present

## 2023-06-19 DIAGNOSIS — Z7401 Bed confinement status: Secondary | ICD-10-CM | POA: Diagnosis present

## 2023-06-19 DIAGNOSIS — E785 Hyperlipidemia, unspecified: Secondary | ICD-10-CM | POA: Diagnosis present

## 2023-06-19 DIAGNOSIS — D72829 Elevated white blood cell count, unspecified: Secondary | ICD-10-CM | POA: Diagnosis present

## 2023-06-19 DIAGNOSIS — Z7409 Other reduced mobility: Secondary | ICD-10-CM | POA: Diagnosis present

## 2023-06-19 DIAGNOSIS — I611 Nontraumatic intracerebral hemorrhage in hemisphere, cortical: Secondary | ICD-10-CM | POA: Diagnosis not present

## 2023-06-19 DIAGNOSIS — M79671 Pain in right foot: Secondary | ICD-10-CM | POA: Diagnosis present

## 2023-06-19 HISTORY — DX: Nontraumatic intracerebral hemorrhage, unspecified: I61.9

## 2023-06-19 LAB — RAPID URINE DRUG SCREEN, HOSP PERFORMED
Amphetamines: NOT DETECTED
Barbiturates: NOT DETECTED
Benzodiazepines: NOT DETECTED
Cocaine: NOT DETECTED
Opiates: NOT DETECTED
Tetrahydrocannabinol: NOT DETECTED

## 2023-06-19 LAB — APTT: aPTT: 28 seconds (ref 24–36)

## 2023-06-19 LAB — COMPREHENSIVE METABOLIC PANEL
ALT: 21 U/L (ref 0–44)
AST: 22 U/L (ref 15–41)
Albumin: 3.4 g/dL — ABNORMAL LOW (ref 3.5–5.0)
Alkaline Phosphatase: 88 U/L (ref 38–126)
Anion gap: 14 (ref 5–15)
BUN: 6 mg/dL (ref 6–20)
CO2: 23 mmol/L (ref 22–32)
Calcium: 9.1 mg/dL (ref 8.9–10.3)
Chloride: 99 mmol/L (ref 98–111)
Creatinine, Ser: 1.22 mg/dL (ref 0.61–1.24)
GFR, Estimated: >60 mL/min (ref >60)
Glucose, Bld: 115 mg/dL — ABNORMAL HIGH (ref 70–99)
Potassium: 4.4 mmol/L (ref 3.5–5.1)
Sodium: 136 mmol/L (ref 135–145)
Total Bilirubin: 0.5 mg/dL (ref 0.3–1.2)
Total Protein: 7.2 g/dL (ref 6.5–8.1)

## 2023-06-19 LAB — URINALYSIS, ROUTINE W REFLEX MICROSCOPIC
Bilirubin Urine: NEGATIVE
Glucose, UA: NEGATIVE mg/dL
Hgb urine dipstick: NEGATIVE
Ketones, ur: NEGATIVE mg/dL
Leukocytes,Ua: NEGATIVE
Nitrite: NEGATIVE
Protein, ur: NEGATIVE mg/dL
Specific Gravity, Urine: 1.023 (ref 1.005–1.030)
pH: 7 (ref 5.0–8.0)

## 2023-06-19 LAB — DIFFERENTIAL
Abs Immature Granulocytes: 0.03 10*3/uL (ref 0.00–0.07)
Basophils Absolute: 0 10*3/uL (ref 0.0–0.1)
Basophils Relative: 0 %
Eosinophils Absolute: 0 10*3/uL (ref 0.0–0.5)
Eosinophils Relative: 0 %
Immature Granulocytes: 0 %
Lymphocytes Relative: 12 %
Lymphs Abs: 1.4 10*3/uL (ref 0.7–4.0)
Monocytes Absolute: 0.7 10*3/uL (ref 0.1–1.0)
Monocytes Relative: 6 %
Neutro Abs: 9.2 10*3/uL — ABNORMAL HIGH (ref 1.7–7.7)
Neutrophils Relative %: 82 %

## 2023-06-19 LAB — CBC
HCT: 38.8 % — ABNORMAL LOW (ref 39.0–52.0)
Hemoglobin: 12.9 g/dL — ABNORMAL LOW (ref 13.0–17.0)
MCH: 28.1 pg (ref 26.0–34.0)
MCHC: 33.2 g/dL (ref 30.0–36.0)
MCV: 84.5 fL (ref 80.0–100.0)
Platelets: 253 10*3/uL (ref 150–400)
RBC: 4.59 MIL/uL (ref 4.22–5.81)
RDW: 14.3 % (ref 11.5–15.5)
WBC: 11.3 10*3/uL — ABNORMAL HIGH (ref 4.0–10.5)
nRBC: 0 % (ref 0.0–0.2)

## 2023-06-19 LAB — ETHANOL: Alcohol, Ethyl (B): <10 mg/dL (ref <10)

## 2023-06-19 LAB — PROTIME-INR
INR: 1.2 (ref 0.8–1.2)
Prothrombin Time: 15.6 seconds — ABNORMAL HIGH (ref 11.4–15.2)

## 2023-06-19 LAB — ECHOCARDIOGRAM LIMITED
AV Vena cont: 0.2 cm
Area-P 1/2: 3.17 cm2
Height: 66 in
S' Lateral: 2.6 cm
Weight: 2116.42 oz

## 2023-06-19 MED ORDER — ACETAMINOPHEN 325 MG PO TABS
650.0000 mg | ORAL_TABLET | ORAL | Status: DC | PRN
  Administered 2023-06-19 – 2023-06-22 (×10): 650 mg via ORAL
  Filled 2023-06-19 (×10): qty 2

## 2023-06-19 MED ORDER — CLEVIDIPINE BUTYRATE 0.5 MG/ML IV EMUL: 0.0000 mg/h | INTRAVENOUS | Status: DC

## 2023-06-19 MED ORDER — PANTOPRAZOLE SODIUM 40 MG IV SOLR: 40.0000 mg | Freq: Every day | INTRAVENOUS | Status: DC

## 2023-06-19 MED ORDER — CHLORHEXIDINE GLUCONATE CLOTH 2 % EX PADS
6.0000 | MEDICATED_PAD | Freq: Every day | CUTANEOUS | Status: DC
  Administered 2023-06-20: 6 via TOPICAL

## 2023-06-19 MED ORDER — IOHEXOL 350 MG/ML SOLN
75.0000 mL | Freq: Once | INTRAVENOUS | Status: AC | PRN
  Administered 2023-06-19: 75 mL via INTRAVENOUS

## 2023-06-19 MED ORDER — SODIUM CHLORIDE 0.9 % IV SOLN: INTRAVENOUS | Status: DC

## 2023-06-19 MED ORDER — STROKE: EARLY STAGES OF RECOVERY BOOK: Freq: Once | Status: AC

## 2023-06-19 MED ORDER — LABETALOL HCL 5 MG/ML IV SOLN: 20.0000 mg | Freq: Once | INTRAVENOUS | Status: DC

## 2023-06-19 MED ORDER — ACETAMINOPHEN 650 MG RE SUPP: 650.0000 mg | RECTAL | Status: DC | PRN

## 2023-06-19 MED ORDER — PANTOPRAZOLE SODIUM 40 MG PO TBEC
40.0000 mg | DELAYED_RELEASE_TABLET | Freq: Every day | ORAL | Status: DC
  Administered 2023-06-19 – 2023-06-21 (×3): 40 mg via ORAL
  Filled 2023-06-19 (×3): qty 1

## 2023-06-19 MED ORDER — EMPTY CONTAINERS FLEXIBLE MISC
900.0000 mg | Freq: Once | Status: AC
  Administered 2023-06-19: 900 mg via INTRAVENOUS
  Filled 2023-06-19: qty 90

## 2023-06-19 MED ORDER — SENNOSIDES-DOCUSATE SODIUM 8.6-50 MG PO TABS
1.0000 | ORAL_TABLET | Freq: Two times a day (BID) | ORAL | Status: DC
  Administered 2023-06-19 – 2023-06-22 (×4): 1 via ORAL
  Filled 2023-06-19 (×5): qty 1

## 2023-06-19 MED ORDER — ACETAMINOPHEN 160 MG/5ML PO SOLN: 650.0000 mg | ORAL | Status: DC | PRN

## 2023-06-19 NOTE — ED Notes (Signed)
ED TO INPATIENT HANDOFF REPORT  ED Nurse Name and Phone #: Rodney Booze [phone]  S Name/Age/Gender Daniel Stuart 57 y.o. male Room/Bed: 034C/034C  Code Status   Code Status: Full Code  Home/SNF/Other Skilled nursing facility Patient oriented to: self, place, time, and situation Is this baseline? Yes   Triage Complete: Triage complete  Chief Complaint ICH (intracerebral hemorrhage) (HCC) [I61.9]  Triage Note Pt [facility]IB[facility]EMS from [facility]. Pt reported to staff that he had sudden vision loss. Pt is not blinking to threat, reports only being able to see shadows. Pupils are equal and reactive to light per EMS. LNW 2200 06/18/2023. Pt taken off Clopidogrel 9 days away unsure why. Pt has contractures, per facility he appears baseline beside vision.  Hx of stroke unknown deficits from previous stroke  118/palp    Allergies No Known Allergies  Level of Care/Admitting Diagnosis ED Disposition     ED Disposition  Admit   Condition  --   Comment  Hospital Area: [facility] [[id number]]  Level of Care: ICU [6]  May admit patient to [facility] or [facility] if equivalent level of care is available:: No  Covid Evaluation: Asymptomatic - no recent exposure (last 10 days) testing not required  Diagnosis: ICH (intracerebral hemorrhage) [facility]) [[id number]]  Admitting Physician: Kerrin Mo  Attending Physician: Jefferson Fuel [phone]  Certification:: I certify this patient will need inpatient services for at least 2 midnights  Expected Medical Readiness: 06/24/2023          B Medical/Surgery History Past Medical History:  Diagnosis Date   Anxiety    Arrhythmia as indication for cardiac pacemaker replacement 03/2020   Frequent falls 03/2020   History of loop recorder 04/2017   Stroke (cerebrum) (HCC)    Tobacco use    Urinary frequency 09/2019   Vitamin D deficiency    Past Surgical History:  Procedure Laterality Date   FOOT SURGERY     LOOP  RECORDER INSERTION N/A 04/13/2017   Procedure: Loop Recorder Insertion;  Surgeon: Marinus Maw, MD;  Location: [facility] INVASIVE CV LAB;  Service: Cardiovascular;  Laterality: N/A;   TEE WITHOUT CARDIOVERSION N/A 04/13/2017   Procedure: TRANSESOPHAGEAL ECHOCARDIOGRAM (TEE);  Surgeon: Jake Bathe, MD;  Location: [facility] ENDOSCOPY;  Service: Cardiovascular;  Laterality: N/A;     A IV Location/Drains/Wounds Patient Lines/Drains/Airways Status     Active Line/Drains/Airways     Name Placement date Placement time Site Days   Peripheral IV 06/19/23 18 G 1" Anterior;Distal;Left;Upper Arm 06/19/23  --  Arm  less than 1   Peripheral IV 06/19/23 22 G 1" Anterior;Proximal;Right Forearm 06/19/23  1235  Forearm  less than 1   Incision (Closed) 04/13/17 Chest Left;Medial 04/13/17  1630  -- 2258            Intake/Output Last 24 hours  Intake/Output Summary (Last 24 hours) at 06/19/2023 1614 Last data filed at 06/19/2023 1344 Gross per 24 hour  Intake 74.48 ml  Output --  Net 74.48 ml    Labs/Imaging Results for orders placed or performed during the hospital encounter of 06/19/23 (from the past 48 hour(s))  Protime-INR     Status: Abnormal   Collection Time: 06/19/23  8:58 AM  Result Value Ref Range   Prothrombin Time 15.6 (H) 11.4 - 15.2 seconds   INR 1.2 0.8 - 1.2    Comment: (NOTE) INR goal varies based on device and disease states. Performed at [facility] Lab, [address], [place], [place] [address]   APTT     Status: None   Collection Time: 06/19/23  8:58 AM  Result Value Ref Range   aPTT 28 24 - 36 seconds    Comment: Performed at [facility] Lab, [address] [place], [place] [address]  CBC     Status: Abnormal   Collection Time: 06/19/23  8:58 AM  Result Value Ref Range   WBC 11.3 (H) 4.0 - 10.5 K/uL   RBC 4.59 4.22 - 5.81 MIL/uL   Hemoglobin 12.9 (L) 13.0 - 17.0 g/dL   HCT 82.9 (L) 56.2 - 13.0 %   MCV 84.5 80.0 - 100.0 fL   MCH 28.1 26.0 - 34.0 pg   MCHC 33.2  30.0 - 36.0 g/dL   RDW 86.5 78.4 - 69.6 %   Platelets 253 150 - 400 K/uL   nRBC 0.0 0.0 - 0.2 %    Comment: Performed at [facility], [address] [place], [place] [address]  Differential     Status: Abnormal   Collection Time: 06/19/23  8:58 AM  Result Value Ref Range   Neutrophils Relative % 82 %   Neutro Abs 9.2 (H) 1.7 - 7.7 K/uL   Lymphocytes Relative 12 %   Lymphs Abs 1.4 0.7 - 4.0 K/uL   Monocytes Relative 6 %   Monocytes Absolute 0.7 0.1 - 1.0 K/uL   Eosinophils Relative 0 %   Eosinophils Absolute 0.0 0.0 - 0.5 K/uL   Basophils Relative 0 %   Basophils Absolute 0.0 0.0 - 0.1 K/uL   Immature Granulocytes 0 %   Abs Immature Granulocytes 0.03 0.00 - 0.07 K/uL    Comment: Performed at [facility] Lab, [address], [place], [place] [address]  Comprehensive metabolic panel     Status: Abnormal   Collection Time: 06/19/23  8:58 AM  Result Value Ref Range   Sodium 136 135 - 145 mmol/L   Potassium 4.4 3.5 - 5.1 mmol/L   Chloride 99 98 - 111 mmol/L   CO2 23 22 - 32 mmol/L   Glucose, Bld 115 (H) 70 - 99 mg/dL    Comment: Glucose reference range applies only to samples taken after fasting for at least 8 hours.   BUN 6 6 - 20 mg/dL   Creatinine, Ser 4.01 0.61 - 1.24 mg/dL   Calcium 9.1 8.9 - 02.7 mg/dL   Total Protein 7.2 6.5 - 8.1 g/dL   Albumin 3.4 (L) 3.5 - 5.0 g/dL   AST 22 15 - 41 U/L   ALT 21 0 - 44 U/L   Alkaline Phosphatase 88 38 - 126 U/L   Total Bilirubin 0.5 0.3 - 1.2 mg/dL   GFR, Estimated >25 >36 mL/min    Comment: (NOTE) Calculated using the CKD-EPI Creatinine Equation (2021)    Anion gap 14 5 - 15    Comment: Performed at [facility] Lab, [address] [place], [place] [address]  Ethanol     Status: None   Collection Time: 06/19/23  8:58 AM  Result Value Ref Range   Alcohol, Ethyl (B) <10 <10 mg/dL    Comment: (NOTE) Lowest detectable limit for serum alcohol is 10 mg/dL.  For medical purposes only. Performed at [facility]  Lab, [address] [place], [place] [address]    CT ANGIO HEAD NECK W WO CM  Result Date: 06/19/2023 CLINICAL DATA:  Hemorrhagic stroke EXAM: CT ANGIOGRAPHY HEAD AND NECK WITH AND  WITHOUT CONTRAST TECHNIQUE: Multidetector CT imaging of the head and neck was performed using the standard protocol during bolus administration of intravenous contrast. Multiplanar CT image reconstructions and MIPs were obtained to evaluate the vascular anatomy. Carotid stenosis measurements (when applicable) are obtained utilizing NASCET criteria, using the distal internal carotid diameter as the denominator. RADIATION DOSE REDUCTION: This exam was performed according to the departmental dose-optimization program which includes automated exposure control, adjustment of the mA and/or kV according to patient size and/or use of iterative reconstruction technique. CONTRAST:  75mL OMNIPAQUE IOHEXOL 350 MG/ML SOLN COMPARISON:  Earlier today FINDINGS: CTA NECK FINDINGS Aortic arch: Normal Right carotid system: Smoothly contoured and widely patent Left carotid system: Absent ICA other than the small cord like structure extending towards the narrow left carotid canal. This has a congenital appearance Vertebral arteries: The left vertebral artery arises from the arch. The right vertebral artery arises from the subclavian. Skeleton: Negative. Other neck: Negative. Upper chest: Biapical emphysema Review of the MIP images confirms the above findings CTA HEAD FINDINGS Anterior circulation: Reconstituted left ICA from the circle-of-Willis, especially left posterior communicating artery. No branch occlusion, beading, or aneurysm. Posterior circulation: The vertebral and basilar arteries are smoothly contoured and widely patent. No branch occlusion, beading, or aneurysm. Negative for vascular malformation. Venous sinuses: Unremarkable Anatomic variants: As above Review of the MIP images confirms the above findings IMPRESSION: 1. No arterial lesion or  spot sign seen at the right cerebral ICH. 2. Absent left ICA with robust intracranial reconstitution. Small left carotid canal suggesting occlusion early in life. 3. Biapical emphysema. Electronically Signed   By: Tiburcio Pea M.D.   On: 06/19/2023 12:19   CT HEAD WO CONTRAST  Result Date: 06/19/2023 CLINICAL DATA:  Loss of vision EXAM: CT HEAD WITHOUT CONTRAST TECHNIQUE: Contiguous axial images were obtained from the base of the skull through the vertex without intravenous contrast. RADIATION DOSE REDUCTION: This exam was performed according to the departmental dose-optimization program which includes automated exposure control, adjustment of the mA and/or kV according to patient size and/or use of iterative reconstruction technique. COMPARISON:  None Available. FINDINGS: Brain: 4.2 x 4.6 x 3 cm high-density hematoma in the right parietooccipital region with small volume intraventricular and subarachnoid extension, clot seen layering in the lateral ventricles. No hydrocephalus. The hemorrhage is at the edge of extensive encephalomalacia which affects the bilateral cerebral cortex roughly along the posterior division MCA territory and watershed no midline shift or masslike finding. Vascular: No hyperdense vessel or unexpected calcification. Skull: Normal. Negative for fracture or focal lesion. Sinuses/Orbits: No acute finding. Other: Critical Value/emergent results were called by telephone at the time of interpretation on 06/19/2023 at 10:07 am to provider [facility] PFEIFFER , who is already aware IMPRESSION: 1. 30 cc acute hematoma in the right parieto-occipital region with small volume intraventricular and subarachnoid extension. 2. Extensive encephalomalacia of the bilateral cerebral cortex. Electronically Signed   By: Tiburcio Pea M.D.   On: 06/19/2023 10:08    Pending Labs Unresulted Labs (From admission, onward)     Start     Ordered   06/20/23 0500  CBC  Tomorrow morning,   R        06/19/23 1123    06/20/23 0500  Comprehensive metabolic panel  Tomorrow morning,   R        06/19/23 1123   06/20/23 0500  Hemoglobin A1c  Tomorrow morning,   R        06/19/23 1123   06/20/23  0500  Lipid panel  Tomorrow morning,   R        06/19/23 1123   06/19/23 0931  Urine rapid drug screen (hosp performed)  Once,   STAT        06/19/23 0931   06/19/23 0931  Urinalysis, Routine w reflex microscopic -Urine, Clean Catch  Once,   URGENT       Question:  Specimen Source  Answer:  Urine, Clean Catch   06/19/23 0931            Vitals/Pain Today's Vitals   06/19/23 1500 06/19/23 1515 06/19/23 1530 06/19/23 1537  BP: 126/85 131/88 127/82   Pulse: 78 83 80   Resp: 14 14 12    Temp:    98.9 F (37.2 C)  TempSrc:    Oral  SpO2: 93% 91% 99%   Weight:      Height:      PainSc:        Isolation Precautions No active isolations  Medications Medications   stroke: early stages of recovery book (has no administration in time range)  acetaminophen (TYLENOL) tablet 650 mg (650 mg Oral Given 06/19/23 1508)    Or  acetaminophen (TYLENOL) 160 MG/5ML solution 650 mg ( Per Tube See Alternative 06/19/23 1508)    Or  acetaminophen (TYLENOL) suppository 650 mg ( Rectal See Alternative 06/19/23 1508)  senna-docusate (Senokot-S) tablet 1 tablet (0 tablets Oral Hold 06/19/23 1130)  pantoprazole (PROTONIX) injection 40 mg (has no administration in time range)  labetalol (NORMODYNE) injection 20 mg (20 mg Intravenous Not Given 06/19/23 1206)    And  clevidipine (CLEVIPREX) infusion 0.5 mg/mL ( Intravenous Not Given 06/19/23 1206)  0.9 %  sodium chloride infusion ( Intravenous Infusion Verify 06/19/23 1344)  coag fact Xa recombinant (ANDEXXA) low dose infusion 900 mg (0 mg Intravenous Stopped 06/19/23 1457)  iohexol (OMNIPAQUE) 350 MG/ML injection 75 mL (75 mLs Intravenous Contrast Given 06/19/23 1157)    Mobility non-ambulatory     Focused Assessments Cardiac Assessment Handoff:  Cardiac Rhythm: Normal sinus  rhythm No results found for: "CKTOTAL", "CKMB", "CKMBINDEX", "TROPONINI" No results found for: "DDIMER" Does the Patient currently have chest pain? No    R Recommendations: See Admitting Provider Note  Report given to:   Additional Notes:

## 2023-06-19 NOTE — ED Notes (Signed)
Family at bedside. 

## 2023-06-19 NOTE — H&P (Deleted)
Neurology H&P  CC: Vision loss  History is obtained from: Patient and chart  HPI: Daniel Stuart is a 57 y.o. male with history of hyperlipidemia, PVD, pacemaker, left carotid occlusion, tobacco abuse and cryptogenic stroke who was admitted from his rehab facility for loss of vision which occurred upon waking up this morning.  Patient states that he can see shapes and lights but is unable to count fingers or make out any details of anything.  He was last seen normal at about 2200 when he went to bed last night.  He was found to have a 30 cc right parieto-occipital ICH with some IVH on CT.  He denies taking any blood thinners and, his Plavix was discontinued 9 days ago.  However, MAR from his facility demonstrates Eliquis with last dose given last night.  Will reverse with Andexxa.  His blood pressure has been under good control since arrival.  CTA showed no arterial lesion or spot sign. All CNS imaging personally reviewed by attending.  ICH score 2  LKW: 2200 8/17 tpa given?: No, ICH IR Thrombectomy? No, ICH Modified Rankin Scale: 4-Needs assistance to walk and tend to bodily needs  NIHSS:  1a Level of Conscious.: 0 1b LOC Questions: 1 1c LOC Commands: 0 2 Best Gaze: 0 3 Visual: 3 4 Facial Palsy: 1 5a Motor Arm - left: 0 5b Motor Arm - Right: 0 6a Motor Leg - Left: 0 6b Motor Leg - Right: 0 7 Limb Ataxia: 0 8 Sensory: 0 9 Best Language: 0 10 Dysarthria: 0 11 Extinct and Inattention.: 0 TOTAL: 5     ROS: A complete ROS was performed and is negative except as noted in the HPI.   Past Medical History:  Diagnosis Date   Anxiety    Arrhythmia as indication for cardiac pacemaker replacement 03/2020   Frequent falls 03/2020   History of loop recorder 04/2017   Stroke (cerebrum) (HCC)    Tobacco use    Urinary frequency 09/2019   Vitamin D deficiency      Family History  Problem Relation Age of Onset   Diabetes Mother    COPD Mother    Diabetes Father    Heart  failure Father    Cancer Sister      Social History:  reports that he has been smoking cigarettes. He started smoking about 45 years ago. He has a 45.6 pack-year smoking history. He has never used smokeless tobacco. He reports that he does not drink alcohol and does not use drugs.   Prior to Admission medications   Medication Sig Start Date End Date Taking? Authorizing Provider  acetaminophen (TYLENOL) 500 MG tablet Take 1,000 mg by mouth every 6 (six) hours as needed for mild pain.   Yes [provider]  aspirin 81 MG EC tablet Take 1 tablet (81 mg total) by mouth daily. 10/09/21  Yes King, Shana Chute, NP  atorvastatin (LIPITOR) 80 MG tablet TAKE 1 TABLET (80 MG TOTAL) BY MOUTH EVERY EVENING. Patient taking differently: Take 80 mg by mouth daily. 02/22/22 06/19/23 Yes Passmore, Enid Derry I, NP  baclofen (LIORESAL) 10 MG tablet Take 10 mg by mouth 3 (three) times daily. 05/25/23  Yes [provider]  cholecalciferol (VITAMIN D3) 25 MCG (1000 UNIT) tablet Take 1,000 Units by mouth daily.   Yes [provider]  citalopram (CELEXA) 10 MG tablet Take 1 tablet (10 mg total) by mouth daily. 11/08/22  Yes Micki Riley, MD  clopidogrel (PLAVIX) 75 MG  tablet Take 1 tablet (75 mg total) by mouth daily. 09/11/22  Yes Erick Blinks, MD  cyanocobalamin 1000 MCG tablet Take 1 tablet (1,000 mcg total) by mouth daily. 09/11/22  Yes Memon, Durward Mallard, MD  ELIQUIS 5 MG TABS tablet Take 5 mg by mouth 2 (two) times daily. 06/18/23  Yes [provider]  gabapentin (NEURONTIN) 300 MG capsule TAKE 1 CAPSULE BY MOUTH BY MOUTH TWO TIMES DAILY Patient taking differently: Take 300 mg by mouth 2 (two) times daily. 04/22/22 06/19/23 Yes Jaffe, Adam R, DO  HYDROcodone-acetaminophen (NORCO/VICODIN) 5-325 MG tablet Take 1 tablet by mouth every 4 (four) hours as needed. 04/29/23  Yes [provider]  oxybutynin (DITROPAN-XL) 10 MG 24 hr tablet Take 10 mg by mouth at bedtime.   Yes [provider]  thiamine (VITAMIN B1) 100 MG tablet Take 1 tablet (100 mg total) by mouth daily. 09/10/22  Yes Erick Blinks, MD  donepezil (ARICEPT) 10 MG tablet Take 0.5 tablets (5 mg total) by mouth at bedtime for 28 days, THEN 1 tablet (10 mg total) at bedtime for 16 days. Patient not taking: Reported on 06/19/2023 11/18/22 06/19/23  Levert Feinstein, MD     Exam: Current vital signs: BP 127/79   Pulse 65   Temp 99 F (37.2 C) (Oral)   Resp (!) 7   Ht 5\' 6"  (1.676 m)   Wt 60 kg   SpO2 100%   BMI 21.35 kg/m    Physical Exam  Constitutional: Appears well-developed and well-nourished.  Psych: Affect appropriate to situation Eyes: No scleral injection HENT: No OP obstrucion Head: Normocephalic.  Cardiovascular: Normal rate and regular rhythm.  Respiratory: Effort normal and breath sounds normal to anterior ascultation GI: Soft.  No distension. There is no tenderness.  Skin: WDI  Neuro: Mental Status: Patient is awake, alert, oriented to person, place, month, year, and situation. Patient is able to give a clear and coherent history. No signs of aphasia or neglect Cranial Nerves: II: Pupils equal round and reactive to light, patient is able to see some light and large shapes but cannot count fingers or reliably perceive movement III,IV, VI: EOMI without ptosis or diploplia.  V: Facial sensation is symmetric to temperature VII: Right facial droop VIII: Hearing is intact to voice X:  phonation intact XI: Shoulder shrug is symmetric. XII: Tongue is midline without atrophy or fasciculations.  Motor: Tone is normal. Bulk is normal.  Good antigravity strength in all 4 extremities Sensory: Sensation is symmetric to light touch in the arms and legs. Cerebellar: Unable to perform   I have reviewed labs in epic and the pertinent results are:    Latest Ref Rng & Units 06/19/2023    8:58 AM 09/09/2022    6:53 AM 09/08/2022    3:00 AM  CBC  WBC 4.0 - 10.5 K/uL 11.3  7.9  10.7    Hemoglobin 13.0 - 17.0 g/dL 21.3  08.6  57.8   Hematocrit 39.0 - 52.0 % 38.8  36.7  33.7   Platelets 150 - 400 K/uL 253  149  129        Latest Ref Rng & Units 06/19/2023    8:58 AM 09/09/2022    6:53 AM 09/08/2022    3:00 AM  BMP  Glucose 70 - 99 mg/dL 469  80  94   BUN 6 - 20 mg/dL 6  7  6    Creatinine 0.61 - 1.24 mg/dL 6.29  5.28  4.13   Sodium 135 -  145 mmol/L 136  139  140   Potassium 3.5 - 5.1 mmol/L 4.4  3.6  4.1   Chloride 98 - 111 mmol/L 99  104  104   CO2 22 - 32 mmol/L 23  25  26    Calcium 8.9 - 10.3 mg/dL 9.1  8.6  8.6   PT/INR 15.6/1.2,  PTT 28  I have reviewed the images obtained:  CT head: 30 cc acute hematoma in right parieto-occipital region with small volume IVH and subarachnoid extension, encephalomalacia of bilateral cerebral cortex  Impression: Right parieto-occipital ICH with IVH  Recommendations: -Admit to ICU for close monitoring of blood pressure control, use Cleviprex if necessary -Vital signs and NIHSS hourly -Stat CTA head and neck -MRI brain with and without contrast tomorrow due to location of stroke in normotensive patient -Stability CT scan in 6 hours at 1600 -Reverse Eliquis with low-dose Andexxa -No anticoagulants or antiplatelet agents -PT/OT/SLP -Stroke team to follow in the morning   This patient is critically ill and at significant risk of neurological worsening, death and care requires constant monitoring of vital signs, hemodynamics,respiratory and cardiac monitoring, neurological assessment, discussion with family, other specialists and medical decision making of high complexity. I spent 35 minutes of neurocritical care time  in the care of  this patient. This was time spent independent of any time provided by nurse practitioner or PA.  Assessment and plan discussed with with attending physician and they are in agreement.   Cortney E Ernestina Columbia , MSN, AGACNP-BC [facility] See Amion for schedule and pager  information 06/19/2023 11:23 AM   Attending Neurohospitalist Addendum Patient seen and examined with APP/Resident. Agree with the history and physical as documented above. Agree with the plan as documented, which I helped formulate. I have edited the note above to reflect my full findings and recommendations. I have independently reviewed the chart, obtained history, review of systems and examined the patient.I have personally reviewed pertinent head/neck/spine imaging (CT/MRI). Please feel free to call with any questions.  This patient is critically ill and at significant risk of neurological worsening, death and care requires constant monitoring of vital signs, hemodynamics,respiratory and cardiac monitoring, neurological assessment, discussion with family, other specialists and medical decision making of high complexity. I spent 60 minutes of neurocritical care time  in the care of  this patient. This was time spent independent of any time provided by nurse practitioner or PA.  Bing Neighbors, MD [facility] [phone]  If 7pm- 7am, please page neurology on call as listed in AMION.

## 2023-06-19 NOTE — ED Notes (Signed)
PT transported to echo.

## 2023-06-19 NOTE — ED Notes (Signed)
Contacted CT about sending PT up ASAP and they responded that they would try to get him in but their tables are full at this time

## 2023-06-19 NOTE — ED Notes (Signed)
PT transported back from Echo with nurse, now being transported to ICU. Floor has been called

## 2023-06-19 NOTE — ED Triage Notes (Addendum)
[facility] Pt reported to staff that he had sudden vision loss. Pt is not blinking to threat, reports only being able to see shadows. Pupils are equal and reactive to light per EMS. LNW 2200 06/18/2023. Pt taken off Clopidogrel 9 days away unsure why. Pt has contractures, per facility he appears baseline beside vision.  Hx of stroke unknown deficits from previous stroke  118/palp

## 2023-06-19 NOTE — ED Notes (Signed)
Updated patient's family [name] about plan of care and admission. [phone] family phone number.

## 2023-06-19 NOTE — ED Notes (Signed)
PT and family sitting at bedside informed about bed status

## 2023-06-19 NOTE — ED Notes (Signed)
Neurology at the bedside

## 2023-06-19 NOTE — ED Notes (Signed)
Neurology at bedside and updating family members.

## 2023-06-19 NOTE — Progress Notes (Signed)
*  PRELIMINARY RESULTS* Echocardiogram 2D Echocardiogram has been performed however it was a limited exam. Patient could not tolerate exam today and told Tech to stop.  Laddie Aquas 06/19/2023, 4:59 PM

## 2023-06-19 NOTE — ED Provider Notes (Signed)
[place] [facility]ENCY DEPARTMENT AT [facility] Provider Note   CSN: [id number] Arrival date & time: 06/19/23  1478     History  Chief Complaint  Patient presents with   Loss of Vision    Daniel Stuart is a 57 y.o. male.  HPI With history of extensive prior CVA.  Patient lives in a SNF and reports he has trouble with speech generation and ambulation.  This is at baseline for him.  He ports he can do some ambulation with a lot of assistance and a walker.  He reports he does go down to eat his meals and feeds himself.  Patient reports that things seemed about baseline yesterday evening around 6 PM.  He describes going and eating a meal.  Sometime however later in the evening he noticed there was a problem with his vision.  He felt like he had lost a lot of vision but he cannot really describe it in any more detail.  This morning the facility called EMS for visual disturbance but had limited additional history to describe the baseline.  Reportedly patient has been off of Plavix for about a month.    Home Medications Prior to Admission medications   Medication Sig Start Date End Date Taking? Authorizing Provider  atorvastatin (LIPITOR) 80 MG tablet TAKE 1 TABLET (80 MG TOTAL) BY MOUTH EVERY EVENING. Patient taking differently: Take 80 mg by mouth daily. 02/22/22 06/19/23 Yes Passmore, Enid Derry I, NP  donepezil (ARICEPT) 10 MG tablet Take 0.5 tablets (5 mg total) by mouth at bedtime for 28 days, THEN 1 tablet (10 mg total) at bedtime for 16 days. 11/18/22 06/19/23 Yes Levert Feinstein, MD  gabapentin (NEURONTIN) 300 MG capsule TAKE 1 CAPSULE BY MOUTH BY MOUTH TWO TIMES DAILY Patient taking differently: Take 300 mg by mouth 2 (two) times daily. 04/22/22 06/19/23 Yes Drema Dallas, DO  acetaminophen (TYLENOL) 500 MG tablet Take 1,000 mg by mouth every 6 (six) hours as needed for mild pain.    [provider]  aspirin 81 MG EC tablet Take 1 tablet (81 mg total) by mouth daily. 10/09/21    Barbette Merino, NP  cholecalciferol (VITAMIN D3) 25 MCG (1000 UNIT) tablet Take 1,000 Units by mouth daily.    [provider]  citalopram (CELEXA) 10 MG tablet Take 1 tablet (10 mg total) by mouth daily. 11/08/22   Micki Riley, MD  clopidogrel (PLAVIX) 75 MG tablet Take 1 tablet (75 mg total) by mouth daily. 09/11/22   Erick Blinks, MD  cyanocobalamin 1000 MCG tablet Take 1 tablet (1,000 mcg total) by mouth daily. 09/11/22   Erick Blinks, MD  KETOCONAZOLE, TOPICAL, 1 % SHAM Apply 1 Application topically 2 (two) times a week.    [provider]  magnesium hydroxide (MILK OF MAGNESIA) 400 MG/5ML suspension Take 30 mLs by mouth daily as needed for mild constipation.    [provider]  oxybutynin (DITROPAN-XL) 10 MG 24 hr tablet Take 10 mg by mouth at bedtime.    [provider]  thiamine (VITAMIN B-1) 100 MG tablet Take 100 mg by mouth daily.    [provider]  thiamine (VITAMIN B1) 100 MG tablet Take 1 tablet (100 mg total) by mouth daily. 09/10/22   Erick Blinks, MD      Allergies    Patient has no known allergies.    Review of Systems   Review of Systems  Physical Exam Updated Vital Signs BP 127/79   Pulse  65   Temp 99 F (37.2 C) (Oral)   Resp (!) 7   Ht 5\' 6"  (1.676 m)   Wt 60 kg   SpO2 100%   BMI 21.35 kg/m  Physical Exam Constitutional:      Comments: Patient is alert.  No acute distress.  No respiratory distress.  Physical deconditioning consistent with immobility.  HENT:     Head: Normocephalic and atraumatic.     Mouth/Throat:     Pharynx: Oropharynx is clear.     Comments: Poor condition of dentition. Eyes:     Comments: Pupils are about 2 to 3 mm symmetric in appearance.  Extraocular motions are intact but patient denies ability to see.  He does have some blink to confrontation.  Fundus exam limited but does not appear to have any significant retinal detachment.  Cardiovascular:     Rate and Rhythm: Normal  rate and regular rhythm.  Pulmonary:     Effort: Pulmonary effort is normal.     Breath sounds: Normal breath sounds.  Abdominal:     General: There is no distension.     Palpations: Abdomen is soft.     Tenderness: There is no abdominal tenderness. There is no guarding.  Musculoskeletal:     Cervical back: Neck supple.     Comments: No peripheral edema.  Calf soft and nontender.  Some muscular atrophy more pronounced in the upper extremities.  Skin:    General: Skin is warm and dry.  Neurological:     Comments: Patient is alert.  He does respond to questions with appropriate responses.  Patient does have slow generation of responses and processing that is consistent with described aphasia secondary to stroke.  However, patient does seem appropriately situationally oriented with situationally appropriate responses.  Is difficult to assess any visual fields.  Patient denies ability to see my hand in front of his face.  He does have some blinking to confrontation.  He endorses some ability to perceive light.  With facial grimace, right mouth droop.  Patient performed grip bilaterally about symmetric 4 out of 5.  Positive for left pronator drift.  Patient can elevate each lower extremity at command independently.  He can hold against gravity.  Psychiatric:        Mood and Affect: Mood normal.     ED Results / Procedures / Treatments   Labs (all labs ordered are listed, but only abnormal results are displayed) Labs Reviewed  PROTIME-INR - Abnormal; Notable for the following components:      Result Value   Prothrombin Time 15.6 (*)    All other components within normal limits  CBC - Abnormal; Notable for the following components:   WBC 11.3 (*)    Hemoglobin 12.9 (*)    HCT 38.8 (*)    All other components within normal limits  DIFFERENTIAL - Abnormal; Notable for the following components:   Neutro Abs 9.2 (*)    All other components within normal limits  COMPREHENSIVE METABOLIC PANEL  - Abnormal; Notable for the following components:   Glucose, Bld 115 (*)    Albumin 3.4 (*)    All other components within normal limits  APTT  ETHANOL  RAPID URINE DRUG SCREEN, HOSP PERFORMED  URINALYSIS, ROUTINE W REFLEX MICROSCOPIC    EKG EKG Interpretation Date/Time:  Sunday June 19 2023 08:58:22 EDT Ventricular Rate:  72 PR Interval:  148 QRS Duration:  79 QT Interval:  361 QTC Calculation: 395 R Axis:   -37  Text Interpretation: Sinus rhythm Consider left atrial enlargement Left axis deviation Posterior infarct, old subtle anterior st/t wave changes compared to previous tracing Confirmed by Arby Barrette [phone]) on 06/19/2023 11:02:17 AM  Radiology CT HEAD WO CONTRAST  Result Date: 06/19/2023 CLINICAL DATA:  Loss of vision EXAM: CT HEAD WITHOUT CONTRAST TECHNIQUE: Contiguous axial images were obtained from the base of the skull through the vertex without intravenous contrast. RADIATION DOSE REDUCTION: This exam was performed according to the departmental dose-optimization program which includes automated exposure control, adjustment of the mA and/or kV according to patient size and/or use of iterative reconstruction technique. COMPARISON:  None Available. FINDINGS: Brain: 4.2 x 4.6 x 3 cm high-density hematoma in the right parietooccipital region with small volume intraventricular and subarachnoid extension, clot seen layering in the lateral ventricles. No hydrocephalus. The hemorrhage is at the edge of extensive encephalomalacia which affects the bilateral cerebral cortex roughly along the posterior division MCA territory and watershed no midline shift or masslike finding. Vascular: No hyperdense vessel or unexpected calcification. Skull: Normal. Negative for fracture or focal lesion. Sinuses/Orbits: No acute finding. Other: Critical Value/emergent results were called by telephone at the time of interpretation on 06/19/2023 at 10:07 am to provider [facility] Daisean Brodhead , who is already  aware IMPRESSION: 1. 30 cc acute hematoma in the right parieto-occipital region with small volume intraventricular and subarachnoid extension. 2. Extensive encephalomalacia of the bilateral cerebral cortex. Electronically Signed   By: Tiburcio Pea M.D.   On: 06/19/2023 10:08    Procedures Procedures   CRITICAL CARE Performed by: Arby Barrette   Total critical care time: 30 minutes  Critical care time was exclusive of separately billable procedures and treating other patients.  Critical care was necessary to treat or prevent imminent or life-threatening deterioration.  Critical care was time spent personally by me on the following activities: development of treatment plan with patient and/or surrogate as well as nursing, discussions with consultants, evaluation of patient's response to treatment, examination of patient, obtaining history from patient or surrogate, ordering and performing treatments and interventions, ordering and review of laboratory studies, ordering and review of radiographic studies, pulse oximetry and re-evaluation of patient's condition.  Medications Ordered in ED Medications - No data to display  ED Course/ Medical Decision Making/ A&P                                 Medical Decision Making Amount and/or Complexity of Data Reviewed Labs: ordered. Radiology: ordered.  Patient presents with visual changes in the setting of severe comorbid illness.  Patient with high risk for CVA.  Consideration given for retinal detachment or intraocular process.  On examination no obvious detachment.  Will proceed with stroke workup.  Patient's blood pressures are normal.  Patient is alert without acute distress.  He does have significant pre-existing stroke sequelae making it difficult to assess acute from chronic.  Will need diagnostic imaging and workup.  10: 07 personal review of CT scan shows large right sided occipital intracerebral hemorrhage.  This was confirmed by  radiology verbally.  No visible midline shift.  Patient's mental status remains alert.  Blood pressures are normotensive.  10: 57 consult reviewed with Dr. Selina Cooley neurology.  She will admit to neurology service.       Final Clinical Impression(s) / ED Diagnoses Final diagnoses:  Nontraumatic hemorrhage of right cerebral hemisphere (HCC)  Severe comorbid illness    Rx / DC  Orders ED Discharge Orders     None         Arby Barrette, MD 06/19/23 1103

## 2023-06-19 NOTE — ED Notes (Signed)
Pt linen, brief changed, and repositioned. Pt provided with warm blankets for comfort.

## 2023-06-19 NOTE — ED Notes (Signed)
Patient transported to CT 

## 2023-06-19 NOTE — ED Notes (Signed)
MD at bedside for patient evaluation.

## 2023-06-19 NOTE — H&P (Signed)
Neurology H&P  CC: Vision loss  History is obtained from: Patient and chart  HPI: Daniel Stuart is a 57 y.o. male with history of hyperlipidemia, PVD, pacemaker, left carotid occlusion, tobacco abuse and cryptogenic stroke who was admitted from his rehab facility for loss of vision which occurred upon waking up this morning.  Patient states that he can see shapes and lights but is unable to count fingers or make out any details of anything.  He was last seen normal at about 2200 when he went to bed last night.  He was found to have a 30 cc right parieto-occipital ICH with some IVH on CT.  He denies taking any blood thinners and, his Plavix was discontinued 9 days ago.  However, MAR from his facility demonstrates Eliquis with last dose given last night.  Will reverse with Andexxa.  His blood pressure has been under good control since arrival.  CTA showed no arterial lesion or spot sign. All CNS imaging personally reviewed by attending.  ICH score 2  LKW: 2200 8/17 tpa given?: No, ICH IR Thrombectomy? No, ICH Modified Rankin Scale: 4-Needs assistance to walk and tend to bodily needs  NIHSS:  1a Level of Conscious.: 0 1b LOC Questions: 1 1c LOC Commands: 0 2 Best Gaze: 0 3 Visual: 3 4 Facial Palsy: 1 5a Motor Arm - left: 0 5b Motor Arm - Right: 0 6a Motor Leg - Left: 0 6b Motor Leg - Right: 0 7 Limb Ataxia: 0 8 Sensory: 0 9 Best Language: 0 10 Dysarthria: 0 11 Extinct and Inattention.: 0 TOTAL: 5     ROS: A complete ROS was performed and is negative except as noted in the HPI.   Past Medical History:  Diagnosis Date   Anxiety    Arrhythmia as indication for cardiac pacemaker replacement 03/2020   Frequent falls 03/2020   History of loop recorder 04/2017   Stroke (cerebrum) (HCC)    Tobacco use    Urinary frequency 09/2019   Vitamin D deficiency      Family History  Problem Relation Age of Onset   Diabetes Mother    COPD Mother    Diabetes Father    Heart  failure Father    Cancer Sister      Social History:  reports that he has been smoking cigarettes. He started smoking about 45 years ago. He has a 45.6 pack-year smoking history. He has never used smokeless tobacco. He reports that he does not drink alcohol and does not use drugs.   Prior to Admission medications   Medication Sig Start Date End Date Taking? Authorizing Provider  acetaminophen (TYLENOL) 500 MG tablet Take 1,000 mg by mouth every 6 (six) hours as needed for mild pain.   Yes [provider]  aspirin 81 MG EC tablet Take 1 tablet (81 mg total) by mouth daily. 10/09/21  Yes King, Shana Chute, NP  atorvastatin (LIPITOR) 80 MG tablet TAKE 1 TABLET (80 MG TOTAL) BY MOUTH EVERY EVENING. Patient taking differently: Take 80 mg by mouth daily. 02/22/22 06/19/23 Yes Passmore, Enid Derry I, NP  baclofen (LIORESAL) 10 MG tablet Take 10 mg by mouth 3 (three) times daily. 05/25/23  Yes [provider]  cholecalciferol (VITAMIN D3) 25 MCG (1000 UNIT) tablet Take 1,000 Units by mouth daily.   Yes [provider]  citalopram (CELEXA) 10 MG tablet Take 1 tablet (10 mg total) by mouth daily. 11/08/22  Yes Micki Riley, MD  clopidogrel (PLAVIX) 75 MG  tablet Take 1 tablet (75 mg total) by mouth daily. 09/11/22  Yes Erick Blinks, MD  cyanocobalamin 1000 MCG tablet Take 1 tablet (1,000 mcg total) by mouth daily. 09/11/22  Yes Memon, Durward Mallard, MD  ELIQUIS 5 MG TABS tablet Take 5 mg by mouth 2 (two) times daily. 06/18/23  Yes [provider]  gabapentin (NEURONTIN) 300 MG capsule TAKE 1 CAPSULE BY MOUTH BY MOUTH TWO TIMES DAILY Patient taking differently: Take 300 mg by mouth 2 (two) times daily. 04/22/22 06/19/23 Yes Jaffe, Adam R, DO  HYDROcodone-acetaminophen (NORCO/VICODIN) 5-325 MG tablet Take 1 tablet by mouth every 4 (four) hours as needed. 04/29/23  Yes [provider]  oxybutynin (DITROPAN-XL) 10 MG 24 hr tablet Take 10 mg by mouth at bedtime.   Yes [provider]  thiamine (VITAMIN B1) 100 MG tablet Take 1 tablet (100 mg total) by mouth daily. 09/10/22  Yes Erick Blinks, MD  donepezil (ARICEPT) 10 MG tablet Take 0.5 tablets (5 mg total) by mouth at bedtime for 28 days, THEN 1 tablet (10 mg total) at bedtime for 16 days. Patient not taking: Reported on 06/19/2023 11/18/22 06/19/23  Levert Feinstein, MD     Exam: Current vital signs: BP 131/81   Pulse 76   Temp 99 F (37.2 C) (Oral)   Resp 11   Ht 5\' 6"  (1.676 m)   Wt 60 kg   SpO2 100%   BMI 21.35 kg/m    Physical Exam  Constitutional: Appears well-developed and well-nourished.  Psych: Affect appropriate to situation Eyes: No scleral injection HENT: No OP obstrucion Head: Normocephalic.  Cardiovascular: Normal rate and regular rhythm.  Respiratory: Effort normal and breath sounds normal to anterior ascultation GI: Soft.  No distension. There is no tenderness.  Skin: WDI  Neuro: Mental Status: Patient is awake, alert, oriented to person, place, month, year, and situation. Patient is able to give a clear and coherent history. No signs of aphasia or neglect Cranial Nerves: II: Pupils equal round and reactive to light, patient is able to see some light and large shapes but cannot count fingers or reliably perceive movement III,IV, VI: EOMI without ptosis or diploplia.  V: Facial sensation is symmetric to temperature VII: Right facial droop VIII: Hearing is intact to voice X:  phonation intact XI: Shoulder shrug is symmetric. XII: Tongue is midline without atrophy or fasciculations.  Motor: Tone is normal. Bulk is normal.  Good antigravity strength in all 4 extremities Sensory: Sensation is symmetric to light touch in the arms and legs. Cerebellar: Unable to perform   I have reviewed labs in epic and the pertinent results are:    Latest Ref Rng & Units 06/19/2023    8:58 AM 09/09/2022    6:53 AM 09/08/2022    3:00 AM  CBC  WBC 4.0 - 10.5 K/uL 11.3  7.9  10.7    Hemoglobin 13.0 - 17.0 g/dL 24.4  01.0  27.2   Hematocrit 39.0 - 52.0 % 38.8  36.7  33.7   Platelets 150 - 400 K/uL 253  149  129        Latest Ref Rng & Units 06/19/2023    8:58 AM 09/09/2022    6:53 AM 09/08/2022    3:00 AM  BMP  Glucose 70 - 99 mg/dL 536  80  94   BUN 6 - 20 mg/dL 6  7  6    Creatinine 0.61 - 1.24 mg/dL 6.44  0.34  7.42   Sodium 135 -  145 mmol/L 136  139  140   Potassium 3.5 - 5.1 mmol/L 4.4  3.6  4.1   Chloride 98 - 111 mmol/L 99  104  104   CO2 22 - 32 mmol/L 23  25  26    Calcium 8.9 - 10.3 mg/dL 9.1  8.6  8.6   PT/INR 15.6/1.2,  PTT 28  I have reviewed the images obtained:  CT head: 30 cc acute hematoma in right parieto-occipital region with small volume IVH and subarachnoid extension, encephalomalacia of bilateral cerebral cortex  Impression: Right parieto-occipital ICH with IVH  Recommendations: -Admit to ICU for close monitoring of blood pressure control, use Cleviprex if necessary -Vital signs and NIHSS hourly -Stat CTA head and neck -MRI brain with and without contrast tomorrow due to location of stroke in normotensive patient -Stability CT scan in 6 hours at 1600 -Reverse Eliquis with low-dose Andexxa -No anticoagulants or antiplatelet agents -PT/OT/SLP -Stroke team to follow in the morning   This patient is critically ill and at significant risk of neurological worsening, death and care requires constant monitoring of vital signs, hemodynamics,respiratory and cardiac monitoring, neurological assessment, discussion with family, other specialists and medical decision making of high complexity. I spent 35 minutes of neurocritical care time  in the care of  this patient. This was time spent independent of any time provided by nurse practitioner or PA.  Assessment and plan discussed with with attending physician and they are in agreement.   Jefferson Fuel , MSN, AGACNP-BC [facility] See Amion for schedule and pager  information 06/19/2023 2:14 PM   Attending Neurohospitalist Addendum Patient seen and examined with APP/Resident. Agree with the history and physical as documented above. Agree with the plan as documented, which I helped formulate. I have edited the note above to reflect my full findings and recommendations. I have independently reviewed the chart, obtained history, review of systems and examined the patient.I have personally reviewed pertinent head/neck/spine imaging (CT/MRI). Please feel free to call with any questions.  This patient is critically ill and at significant risk of neurological worsening, death and care requires constant monitoring of vital signs, hemodynamics,respiratory and cardiac monitoring, neurological assessment, discussion with family, other specialists and medical decision making of high complexity. I spent 60 minutes of neurocritical care time  in the care of  this patient. This was time spent independent of any time provided by nurse practitioner or PA.  Bing Neighbors, MD [facility] [phone]  If 7pm- 7am, please page neurology on call as listed in AMION.

## 2023-06-20 ENCOUNTER — Inpatient Hospital Stay (HOSPITAL_COMMUNITY): Payer: Medicare Other

## 2023-06-20 DIAGNOSIS — I611 Nontraumatic intracerebral hemorrhage in hemisphere, cortical: Secondary | ICD-10-CM | POA: Diagnosis not present

## 2023-06-20 LAB — COMPREHENSIVE METABOLIC PANEL
ALT: 18 U/L (ref 0–44)
AST: 15 U/L (ref 15–41)
Albumin: 3.1 g/dL — ABNORMAL LOW (ref 3.5–5.0)
Alkaline Phosphatase: 84 U/L (ref 38–126)
Anion gap: 10 (ref 5–15)
BUN: 6 mg/dL (ref 6–20)
CO2: 22 mmol/L (ref 22–32)
Calcium: 8.5 mg/dL — ABNORMAL LOW (ref 8.9–10.3)
Chloride: 104 mmol/L (ref 98–111)
Creatinine, Ser: 1.21 mg/dL (ref 0.61–1.24)
GFR, Estimated: >60 mL/min (ref >60)
Glucose, Bld: 99 mg/dL (ref 70–99)
Potassium: 4.2 mmol/L (ref 3.5–5.1)
Sodium: 136 mmol/L (ref 135–145)
Total Bilirubin: 0.5 mg/dL (ref 0.3–1.2)
Total Protein: 6.8 g/dL (ref 6.5–8.1)

## 2023-06-20 LAB — CBC
HCT: 36.7 % — ABNORMAL LOW (ref 39.0–52.0)
Hemoglobin: 12.3 g/dL — ABNORMAL LOW (ref 13.0–17.0)
MCH: 28.1 pg (ref 26.0–34.0)
MCHC: 33.5 g/dL (ref 30.0–36.0)
MCV: 83.8 fL (ref 80.0–100.0)
Platelets: 238 10*3/uL (ref 150–400)
RBC: 4.38 MIL/uL (ref 4.22–5.81)
RDW: 14.5 % (ref 11.5–15.5)
WBC: 8.9 10*3/uL (ref 4.0–10.5)
nRBC: 0 % (ref 0.0–0.2)

## 2023-06-20 LAB — LIPID PANEL
Cholesterol: 91 mg/dL (ref 0–200)
HDL: 32 mg/dL — ABNORMAL LOW (ref >40)
LDL Cholesterol: 46 mg/dL (ref 0–99)
Total CHOL/HDL Ratio: 2.8 RATIO
Triglycerides: 66 mg/dL (ref <150)
VLDL: 13 mg/dL (ref 0–40)

## 2023-06-20 LAB — HEMOGLOBIN A1C
Hgb A1c MFr Bld: 6.1 % — ABNORMAL HIGH (ref 4.8–5.6)
Mean Plasma Glucose: 128.37 mg/dL

## 2023-06-20 LAB — MRSA NEXT GEN BY PCR, NASAL: MRSA by PCR Next Gen: DETECTED — AB

## 2023-06-20 MED ORDER — THIAMINE HCL 100 MG PO TABS
100.0000 mg | ORAL_TABLET | Freq: Every day | ORAL | Status: DC
  Administered 2023-06-20 – 2023-06-22 (×3): 100 mg via ORAL
  Filled 2023-06-20 (×6): qty 1

## 2023-06-20 MED ORDER — MUPIROCIN 2 % EX OINT
1.0000 | TOPICAL_OINTMENT | Freq: Two times a day (BID) | CUTANEOUS | Status: DC
  Administered 2023-06-20 – 2023-06-22 (×6): 1 via NASAL
  Filled 2023-06-20: qty 22

## 2023-06-20 MED ORDER — BACLOFEN 10 MG PO TABS
10.0000 mg | ORAL_TABLET | Freq: Three times a day (TID) | ORAL | Status: DC
  Administered 2023-06-20 – 2023-06-22 (×6): 10 mg via ORAL
  Filled 2023-06-20 (×6): qty 1

## 2023-06-20 MED ORDER — GADOBUTROL 1 MMOL/ML IV SOLN
6.0000 mL | Freq: Once | INTRAVENOUS | Status: AC | PRN
  Administered 2023-06-20: 6 mL via INTRAVENOUS

## 2023-06-20 MED ORDER — NICOTINE 14 MG/24HR TD PT24
14.0000 mg | MEDICATED_PATCH | Freq: Every day | TRANSDERMAL | Status: DC
  Administered 2023-06-22: 14 mg via TRANSDERMAL
  Filled 2023-06-20 (×3): qty 1

## 2023-06-20 MED ORDER — CITALOPRAM HYDROBROMIDE 10 MG PO TABS
10.0000 mg | ORAL_TABLET | Freq: Every day | ORAL | Status: DC
  Administered 2023-06-20 – 2023-06-22 (×3): 10 mg via ORAL
  Filled 2023-06-20 (×3): qty 1

## 2023-06-20 NOTE — Evaluation (Signed)
Occupational Therapy Evaluation Patient Details Name: Daniel Stuart MRN: [id number] DOB: Aug 04, 1966 Today's Date: 06/20/2023   History of Present Illness Patient is 57 y.o. male who was admitted from his rehab facility for loss of vision which occurred upon waking up this morning. PMH significant of hyperlipidemia, PVD, pacemaker, left carotid occlusion, tobacco abuse and cryptogenic stroke. CT revealed a 30 cc right parieto-occipital ICH with some IVH. Pt is resident at [facility] and at baseline has some ambulation difficulty reliant RW and some speech difficulty.   Clinical Impression   PTA patient reporting residing at [facility] [address], needing assist for ADLs and transfers into his wheelchair.  He was admitted for above and presents with problem list below, including impaired vision, decreased activity tolerance, pain in BLEs, and impaired cognition. Question history, he does report needing assist but demonstrates some difficulty describing needs.  He is very anxious about mobilization today, anticipate due to visual changes; requires maximal multimodal cueing to guide him.  He is only agreeable to bed mobility at this time.  Requires max assist for rolling in bed, setup to wash face but mod assist to don glasses. He is able to see shadows once shades in room are open.  Based on performance today, believe patient will best benefit from continued OT services acutely and after dc at inpatient setting with <3hrs/day to optimize independence, safety with ADLs and mobility.      If plan is discharge home, recommend the following: Two people to help with walking and/or transfers;A lot of help with bathing/dressing/bathroom;Assistance with feeding;Assistance with cooking/housework;Direct supervision/assist for medications management;Direct supervision/assist for financial management;Assist for transportation;Supervision due to cognitive status;Help with stairs or ramp for entrance    Functional Status  Assessment  Patient has had a recent decline in their functional status and demonstrates the ability to make significant improvements in function in a reasonable and predictable amount of time.  Equipment Recommendations  Other (comment) (defer)    Recommendations for Other Services       Precautions / Restrictions Precautions Precautions: Fall Precaution Comments: SBP 130-150 Restrictions Weight Bearing Restrictions: No      Mobility Bed Mobility Overal bed mobility: Needs Assistance Bed Mobility: Rolling Rolling: Max assist, Used rails         General bed mobility comments: Max assist with tactile cues to guide Bil UE for contralateral reaching to bed rail. Max assist to flex Lt knee, Rt knee maintained in flexion throughout.. Bed pad utilized to complete upper/lower trunk roll. Pt required +2 Total to boost superior in bed and repositioned to chair position. Pt reported more comfortable in upright chair posture.    Transfers                   General transfer comment: deferred      Balance                                           ADL either performed or assessed with clinical judgement   ADL Overall ADL's : Needs assistance/impaired     Grooming: Minimal assistance;Maximal assistance;Sitting Grooming Details (indicate cue type and reason): upright in bed, able to wash face but would require increased assist for oral care         Upper Body Dressing : Maximal assistance;Sitting   Lower Body Dressing: Total assistance;+2 for physical assistance;+2 for safety/equipment;Bed level  Toilet Transfer Details (indicate cue type and reason): deferred         Functional mobility during ADLs: Maximal assistance General ADL Comments: pt limited due to visual changes, requires max assist and max verbal/tactile cueing to guide pt through ADL and bed mobility participation.  He is very anxious and fearful with engagement.     Vision  Baseline Vision/History: 1 Wears glasses Ability to See in Adequate Light: 0 Adequate Patient Visual Report: Other (comment) ("I can't see") Vision Assessment?: Yes Additional Comments: pt reports vision is black, with cueing able to describe seeing some shadows with natural light provided in room but requires unable to describe further visual input. Continue assessment.     Perception         Praxis         Pertinent Vitals/Pain Pain Assessment Pain Assessment: Faces Faces Pain Scale: Hurts whole lot Pain Location: B LEs Pain Descriptors / Indicators: Discomfort, Grimacing, Guarding Pain Intervention(s): Limited activity within patient's tolerance, Monitored during session, Repositioned     Extremity/Trunk Assessment Upper Extremity Assessment Upper Extremity Assessment: Right hand dominant;RUE deficits/detail;LUE deficits/detail RUE Deficits / Details: able to raise UEs to shoulder level, using functionally to wash face with increased time RUE Coordination: WNL LUE Deficits / Details: able to raise UE to shoulder level, functionally requires support to place L hand on glasses to don demonstrating decreased coordination LUE Coordination: decreased gross motor;decreased fine motor   Lower Extremity Assessment Lower Extremity Assessment: Defer to PT evaluation RLE Deficits / Details: Flexion contractures of Rt hip/knee, knee ROM ~ 130* flex to 30* lacking extension. pt lacking 5-10* from neutral in plantarflexed contracture. increased tone noted in hip flexors, hamstrings, hip adductors. pt resting in frog legged position. RLE Coordination: decreased gross motor;decreased fine motor LLE Deficits / Details: Flexion contractures of Rt hip/knee, knee ROM ~ 100* flex to 15* lacking extension. pt lacking 5-10* from neutral in plantarflexed contracture. increased tone noted in hip flexors, hamstrings, hip adductors. pt resting in frog legged position. LLE Coordination: decreased gross  motor;decreased fine motor   Cervical / Trunk Assessment Cervical / Trunk Assessment: Kyphotic   Communication Communication Communication: Difficulty communicating thoughts/reduced clarity of speech (delayed communication) Cueing Techniques: Verbal cues;Tactile cues   Cognition Arousal: Alert Behavior During Therapy: Anxious, Flat affect Overall Cognitive Status: Impaired/Different from baseline Area of Impairment: Attention, Memory, Following commands, Safety/judgement, Awareness, Problem solving                   Current Attention Level: Sustained Memory: Decreased short-term memory Following Commands: Follows one step commands inconsistently, Follows one step commands with increased time Safety/Judgement: Decreased awareness of deficits Awareness: Intellectual Problem Solving: Slow processing, Decreased initiation, Difficulty sequencing, Requires verbal cues, Requires tactile cues General Comments: pt questionable historian on PLOF. CHart indicates pt was ambulating with RW however pt reports staff provides a lot of assistance for bed<>WC transfers. He is oriented and follows simple commands with mulitmodal cueing, very anxious about mobilization. Noted difficulty following multiple step commands and sequencing. Unsure of baseline.     General Comments  VSS on RA    Exercises     Shoulder Instructions      Home Living Family/patient expects to be discharged to:: Skilled nursing facility [facility])     Type of Home: Apartment Home Access: Level entry     Home Layout: One level               Home Equipment: Wheelchair - manual;Wheelchair -  power   Additional Comments: reporting being in [place] for ~1 yr, getting therapy (PT, OT and just started ST)      Prior Functioning/Environment Prior Level of Function : Needs assist;Patient poor historian/Family not available             Mobility Comments: reports needing assist for transfers into the wc,  using grabbar to transfer onto commode ADLs Comments: needs assist for most ADLs        OT Problem List: Decreased strength;Decreased activity tolerance;Impaired balance (sitting and/or standing);Decreased coordination;Impaired vision/perception;Decreased cognition;Decreased safety awareness;Decreased knowledge of use of DME or AE;Decreased knowledge of precautions;Pain      OT Treatment/Interventions: Self-care/ADL training;Neuromuscular education;DME and/or AE instruction;Therapeutic activities;Balance training;Patient/family education;Cognitive remediation/compensation;Visual/perceptual remediation/compensation    OT Goals(Current goals can be found in the care plan section) Acute Rehab OT Goals Patient Stated Goal: less pain OT Goal Formulation: With patient Time For Goal Achievement: 07/04/23 Potential to Achieve Goals: Fair  OT Frequency: Min 1X/week    Co-evaluation PT/OT/SLP Co-Evaluation/Treatment: Yes Reason for Co-Treatment: Complexity of the patient's impairments (multi-system involvement);For patient/therapist safety;To address functional/ADL transfers PT goals addressed during session: Mobility/safety with mobility;Balance;Strengthening/ROM OT goals addressed during session: ADL's and self-care;Strengthening/ROM      AM-PAC OT "6 Clicks" Daily Activity     Outcome Measure Help from another person eating meals?: Total Help from another person taking care of personal grooming?: A Lot Help from another person toileting, which includes using toliet, bedpan, or urinal?: A Lot Help from another person bathing (including washing, rinsing, drying)?: A Lot Help from another person to put on and taking off regular upper body clothing?: A Lot Help from another person to put on and taking off regular lower body clothing?: Total 6 Click Score: 10   End of Session Nurse Communication: Mobility status;Other (comment) (requesting RN to place heart monitor sticker on call bell to  increase tactile input to find nurses button)  Activity Tolerance: Patient tolerated treatment well Patient left: in bed;with call bell/phone within reach;with bed alarm set  OT Visit Diagnosis: Other abnormalities of gait and mobility (R26.89);Muscle weakness (generalized) (M62.81);Low vision, both eyes (H54.2);Pain Pain - Right/Left:  (bil) Pain - part of body: Leg                Time: 8119-1478 OT Time Calculation (min): 35 min Charges:  OT General Charges $OT Visit: 1 Visit OT Evaluation $OT Eval Moderate Complexity: 1 Mod  Barry Brunner, OT Acute Rehabilitation Services Office [phone]   Chancy Milroy 06/20/2023, 2:24 PM

## 2023-06-20 NOTE — Progress Notes (Signed)
OT Cancellation Note  Patient Details Name: Daniel Stuart MRN: [id number] DOB: [birth year]   Cancelled Treatment:    Reason Eval/Treat Not Completed: Active bedrest order- will follow and see as appropriate/able.   Barry Brunner, OT Acute Rehabilitation Services Office [phone]   Chancy Milroy 06/20/2023, 8:27 AM

## 2023-06-20 NOTE — Evaluation (Signed)
Physical Therapy Evaluation Patient Details Name: Daniel Stuart MRN: [id number] DOB: Dec 18, 1965 Today's Date: 06/20/2023  History of Present Illness  Patient is 57 y.o. male who was admitted from his rehab facility for loss of vision which occurred upon waking up this morning. PMH significant of hyperlipidemia, PVD, pacemaker, left carotid occlusion, tobacco abuse and cryptogenic stroke. CT revealed a 30 cc right parieto-occipital ICH with some IVH. Pt is resident at [facility] and at baseline has some ambulation difficulty reliant RW and some speech difficulty.   Clinical Impression  Daniel Stuart is 57 y.o. male admitted with above HPI and diagnosis. Patient is currently limited by functional impairments below (see PT problem list). Patient has resided at [facility] for ~ 1 year per pt report. He reports at SNF he requires assist for bed mobility, and bed<>WC transfers as well as for all ADL's. Pt reports he stands for toilet transfers with assist from staff. On eval pt demonstrates bil LE flexion contractures at hip/knees and is agreeable only to bed mobility and chair position due to fear of mobilizing; suspect secondary to new visual deficits. He required Max assist to roll Rt/Lt and was unable to initiate lower trunk roll and required manual facilitation to reach bed rail for upper trunk rolling. Patient will benefit from continued skilled PT interventions to address impairments and progress independence with mobility. Patient will benefit from continued inpatient follow up therapy, <3 hours/day. Acute PT will follow and progress as able.         If plan is discharge home, recommend the following: Two people to help with walking and/or transfers;Two people to help with bathing/dressing/bathroom;Assistance with feeding;Direct supervision/assist for medications management;Assist for transportation;Help with stairs or ramp for entrance;Supervision due to cognitive status;Assistance with  cooking/housework   Can travel by private vehicle   No    Equipment Recommendations  (defer to facility)  Recommendations for Other Services       Functional Status Assessment Patient has had a recent decline in their functional status and/or demonstrates limited ability to make significant improvements in function in a reasonable and predictable amount of time     Precautions / Restrictions Precautions Precautions: Fall Precaution Comments: SBP 130-150 Restrictions Weight Bearing Restrictions: No      Mobility  Bed Mobility Overal bed mobility: Needs Assistance Bed Mobility: Rolling Rolling: Max assist, Used rails         General bed mobility comments: Max assist with tactile cues to guide Bil UE for contralateral reaching to bed rail. Max assist to flex Lt knee, Rt knee maintained in flexion throughout.. Bed pad utilized to complete upper/lower trunk roll. Pt required +2 Total to boost superior in bed and repositioned to chair position. Pt reported more comfortable in upright chair posture.    Transfers                        Ambulation/Gait                  Stairs            Wheelchair Mobility     Tilt Bed    Modified Rankin (Stroke Patients Only)       Balance                                             Pertinent  Vitals/Pain Pain Assessment Pain Assessment: Faces Faces Pain Scale: Hurts even more Pain Location: Bil LE's with bed mobility, improved in chair position Pain Descriptors / Indicators: Discomfort, Grimacing, Guarding Pain Intervention(s): Limited activity within patient's tolerance, Monitored during session, Repositioned    Home Living Family/patient expects to be discharged to:: Skilled nursing facility [facility])     Type of Home: Apartment Home Access: Level entry       Home Layout: One level Home Equipment: Wheelchair - Engineer, [employer] - power Additional Comments: reporting being  in [place] for ~1 yr, getting therapy (PT, OT and just started ST)    Prior Function Prior Level of Function : Needs assist             Mobility Comments: reports needing assist for transfers into the wc, using grabbar to transfer onto commode ADLs Comments: needs assist for most ADLs     Extremity/Trunk Assessment   Upper Extremity Assessment Upper Extremity Assessment: Defer to OT evaluation    Lower Extremity Assessment Lower Extremity Assessment: RLE deficits/detail;LLE deficits/detail;Difficult to assess due to impaired cognition RLE Deficits / Details: Flexion contractures of Rt hip/knee, knee ROM ~ 130* flex to 30* lacking extension. pt lacking 5-10* from neutral in plantarflexed contracture. increased tone noted in hip flexors, hamstrings, hip adductors. pt resting in frog legged position. RLE Coordination: decreased gross motor;decreased fine motor LLE Deficits / Details: Flexion contractures of Rt hip/knee, knee ROM ~ 100* flex to 15* lacking extension. pt lacking 5-10* from neutral in plantarflexed contracture. increased tone noted in hip flexors, hamstrings, hip adductors. pt resting in frog legged position. LLE Coordination: decreased gross motor;decreased fine motor    Cervical / Trunk Assessment Cervical / Trunk Assessment: Kyphotic  Communication   Communication Communication: Difficulty communicating thoughts/reduced clarity of speech (delayed communication) Cueing Techniques: Verbal cues;Tactile cues  Cognition Arousal: Alert Behavior During Therapy: Flat affect Overall Cognitive Status: Impaired/Different from baseline Area of Impairment: Orientation, Attention, Memory, Following commands, Safety/judgement, Awareness, Problem solving                   Current Attention Level: Sustained Memory: Decreased short-term memory Following Commands: Follows one step commands inconsistently, Follows one step commands with increased time Safety/Judgement:  Decreased awareness of deficits Awareness: Intellectual Problem Solving: Slow processing, Decreased initiation, Difficulty sequencing, Requires verbal cues, Requires tactile cues General Comments: pt questionable historina on PLOF. CHart indicates pt was mbulating with RW however pt reports staff provides a lot of assistance for bed<>WC transfers.        General Comments      Exercises     Assessment/Plan    PT Assessment Patient needs continued PT services  PT Problem List         PT Treatment Interventions DME instruction;Manual techniques;Wheelchair mobility training;Patient/family education;Cognitive remediation;Neuromuscular re-education;Balance training;Therapeutic exercise;Therapeutic activities;Functional mobility training;Stair training;Gait training    PT Goals (Current goals can be found in the Care Plan section)  Acute Rehab PT Goals Patient Stated Goal: return to [facility] PT Goal Formulation: With patient Time For Goal Achievement: 07/04/23 Potential to Achieve Goals: Fair    Frequency Min 1X/week     Co-evaluation PT/OT/SLP Co-Evaluation/Treatment: Yes Reason for Co-Treatment: Complexity of the patient's impairments (multi-system involvement);For patient/therapist safety;To address functional/ADL transfers PT goals addressed during session: Mobility/safety with mobility;Balance;Strengthening/ROM OT goals addressed during session: ADL's and self-care;Strengthening/ROM       AM-PAC PT "6 Clicks" Mobility  Outcome Measure Help needed turning from your back to your side while in a  flat bed without using bedrails?: Total Help needed moving from lying on your back to sitting on the side of a flat bed without using bedrails?: Total Help needed moving to and from a bed to a chair (including a wheelchair)?: Total Help needed standing up from a chair using your arms (e.g., wheelchair or bedside chair)?: Total Help needed to walk in hospital room?: Total Help  needed climbing 3-5 steps with a railing? : Total 6 Click Score: 6    End of Session   Activity Tolerance: Patient tolerated treatment well Patient left: in bed;with call bell/phone within reach Nurse Communication: Mobility status PT Visit Diagnosis: Unsteadiness on feet (R26.81);Other abnormalities of gait and mobility (R26.89);Muscle weakness (generalized) (M62.81);Difficulty in walking, not elsewhere classified (R26.2);Other symptoms and signs involving the nervous system (R29.898);Other (comment) (vision changes)    Time: 7829-5621 PT Time Calculation (min) (ACUTE ONLY): 33 min   Charges:   PT Evaluation $PT Eval Moderate Complexity: 1 Mod   PT General Charges $$ ACUTE PT VISIT: 1 Visit         Wynn Maudlin, DPT Acute Rehabilitation Services Office [phone]  06/20/23 1:21 PM

## 2023-06-20 NOTE — Progress Notes (Signed)
Called MRI to inquire about time to bring pt down for scan this morning.  MRI tech stated scan can not be done tonight.  Pt's pacemaker will need to be reviewed before MRI can be completed.

## 2023-06-20 NOTE — Progress Notes (Addendum)
STROKE TEAM PROGRESS NOTE   BRIEF HPI Daniel Stuart is a 57 y.o. male with history of hyperlipidemia, PVD, pacemaker, left carotid occlusion, tobacco abuse and cryptogenic stroke who was admitted from his rehab facility for loss of vision which occurred upon waking up this morning.  Patient states that he can see shapes and lights but is unable to count fingers or make out any details of anything.  LKW 2200 8/17, mRS 4, NIH 5. He was found to have a 30 cc right parieto-occipital ICH with some IVH on CT, ICH score 2.  SIGNIFICANT HOSPITAL EVENTS 8/18: Admitted to ICU due to R parieto-occipital  ICH with IVH.  Repeat CT: Stable 8/19: MRI Stable. Extensive chronic cerebral cortex infarcts.  INTERIM HISTORY/SUBJECTIVE  On exam, patient states he has no vision or light perception in either eye. Pupils are reactive, unequal size. Delayed and very inconsistent blink to threat bilaterally. Cognitive impairment at baseline. No focal weakness, but increased tone to RLE. Patient states he does not walk, only uses wheelchair at his facility. Patient has ha previous documentation of vision issues with previous hospital admissions, 2018 and 2020  Will transfer out of the ICU.   OBJECTIVE  CBC    Component Value Date/Time   WBC 8.9 06/20/2023 0149   RBC 4.38 06/20/2023 0149   HGB 12.3 (L) 06/20/2023 0149   HGB 15.3 06/11/2020 1041   HCT 36.7 (L) 06/20/2023 0149   HCT 44.9 06/11/2020 1041   PLT 238 06/20/2023 0149   PLT 230 06/11/2020 1041   MCV 83.8 06/20/2023 0149   MCV 93 06/11/2020 1041   MCH 28.1 06/20/2023 0149   MCHC 33.5 06/20/2023 0149   RDW 14.5 06/20/2023 0149   RDW 13.8 06/11/2020 1041   LYMPHSABS 1.4 06/19/2023 0858   LYMPHSABS 1.7 06/11/2020 1041   MONOABS 0.7 06/19/2023 0858   EOSABS 0.0 06/19/2023 0858   EOSABS 0.1 06/11/2020 1041   BASOSABS 0.0 06/19/2023 0858   BASOSABS 0.0 06/11/2020 1041    BMET    Component Value Date/Time   NA 136 06/20/2023 0149   NA  140 01/11/2022 1121   K 4.2 06/20/2023 0149   CL 104 06/20/2023 0149   CO2 22 06/20/2023 0149   GLUCOSE 99 06/20/2023 0149   BUN 6 06/20/2023 0149   BUN 5 (L) 01/11/2022 1121   CREATININE 1.21 06/20/2023 0149   CREATININE 1.24 04/18/2017 1012   CALCIUM 8.5 (L) 06/20/2023 0149   EGFR 80 01/11/2022 1121   GFRNONAA >60 06/20/2023 0149   GFRNONAA 66 04/18/2017 1012    IMAGING past 24 hours ECHOCARDIOGRAM LIMITED  Result Date: 06/19/2023    ECHOCARDIOGRAM REPORT   Patient Name:   Daniel Stuart Date of Exam: 06/19/2023 Medical Rec #:  [id number]         Height:       66.0 in Accession #:    [id number]        Weight:       132.3 lb Date of Birth:  Jan 23, 1966         BSA:          1.678 m Patient Age:    57 years          BP:           117/80 mmHg Patient Gender: M                 HR:           74  bpm. Exam Location:  Inpatient Procedure: 2D Echo, Cardiac Doppler and Color Doppler Indications:    Stroke I63.9  History:        Patient has prior history of Echocardiogram examinations, most                 recent 09/08/2022. Pacemaker, PAD and Carotid Disease; Risk                 Factors:Current Smoker and Dyslipidemia.  Sonographer:    Dondra Prader RVT Referring Phys: [id number] Lennox Solders [place] T[place]  Sonographer Comments: Patient unable to tolerate exam and told sonographer he wanted to stop. Patient appeared confused and agitated. IMPRESSIONS  1. Limited study as pt would not continue after initial apical images; all views not obtained.  2. Left ventricular ejection fraction, by estimation, is 60 to 65%. The left ventricle has normal function. The left ventricle has no regional wall motion abnormalities. Left ventricular diastolic function could not be evaluated.  3. Right ventricular systolic function is normal. The right ventricular size is normal.  4. The mitral valve is normal in structure. No evidence of mitral valve regurgitation.  5. Tricuspid valve regurgitation not assessed.  6. The aortic valve is  tricuspid. Aortic valve regurgitation is mild. Aortic valve sclerosis/calcification is present, without any evidence of aortic stenosis. FINDINGS  Left Ventricle: Left ventricular ejection fraction, by estimation, is 60 to 65%. The left ventricle has normal function. The left ventricle has no regional wall motion abnormalities. The left ventricular internal cavity size was normal in size. There is  no left ventricular hypertrophy. Left ventricular diastolic function could not be evaluated. Right Ventricle: The right ventricular size is normal. Right ventricular systolic function is normal. Left Atrium: Left atrial size was normal in size. Right Atrium: Right atrial size was normal in size. Pericardium: There is no evidence of pericardial effusion. Mitral Valve: The mitral valve is normal in structure. No evidence of mitral valve regurgitation. Tricuspid Valve: The tricuspid valve is normal in structure. Tricuspid valve regurgitation not assessed. Aortic Valve: The aortic valve is tricuspid. Aortic valve regurgitation is mild. Aortic valve sclerosis/calcification is present, without any evidence of aortic stenosis. Pulmonic Valve: The pulmonic valve was not well visualized. Pulmonic valve regurgitation is not visualized. No evidence of pulmonic stenosis. Aorta: The aortic root is normal in size and structure. IAS/Shunts: The interatrial septum was not assessed. Additional Comments: Limited study as pt would not continue after initial apical images; all views not obtained.  LEFT VENTRICLE PLAX 2D LVIDd:         3.50 cm LVIDs:         2.60 cm LV PW:         0.90 cm LV IVS:        0.90 cm LVOT diam:     1.90 cm LVOT Area:     2.84 cm  LEFT ATRIUM         Index LA diam:    2.40 cm 1.43 cm/m  AORTIC VALVE               PULMONIC VALVE AR Vena Contracta: 0.20 cm PV Vmax:       0.96 m/s                            PV Peak grad:  3.6 mmHg AORTA Ao Root diam: 2.80 cm Ao Asc diam:  2.90 cm MITRAL VALVE MV Area (PHT):  3.17 cm      SHUNTS MV Decel Time: 239 msec     Systemic Diam: 1.90 cm MV E velocity: 73.90 cm/s MV A velocity: 111.00 cm/s MV E/A ratio:  0.67 Olga Millers MD Electronically signed by Olga Millers MD Signature Date/Time: 06/19/2023/5:08:55 PM    Final    CT HEAD WO CONTRAST  Result Date: 06/19/2023 CLINICAL DATA:  Stroke, hemorrhagic. EXAM: CT HEAD WITHOUT CONTRAST TECHNIQUE: Contiguous axial images were obtained from the base of the skull through the vertex without intravenous contrast. RADIATION DOSE REDUCTION: This exam was performed according to the departmental dose-optimization program which includes automated exposure control, adjustment of the mA and/or kV according to patient size and/or use of iterative reconstruction technique. COMPARISON:  Head CT 06/19/2023 at 0955 hours. FINDINGS: Brain: Unchanged large right parieto-occipital intraparenchymal hematoma with small volume intraventricular extension. No new hemorrhage. Stable ventricular size. No midline shift. Unchanged findings from old bilateral frontal parietal and left occipital lobe infarcts. No new loss of gray-white differentiation. Vascular: Circulating contrast from recent CTA. Skull: No calvarial fracture or suspicious bone lesion. Skull base is unremarkable. Sinuses/Orbits: No acute findings. Other: None. IMPRESSION: Unchanged large right parieto-occipital intraparenchymal hematoma with small volume intraventricular extension. No new hemorrhage. Stable ventricular size. Electronically Signed   By: Orvan Falconer M.D.   On: 06/19/2023 16:26   CT ANGIO HEAD NECK W WO CM  Result Date: 06/19/2023 CLINICAL DATA:  Hemorrhagic stroke EXAM: CT ANGIOGRAPHY HEAD AND NECK WITH AND WITHOUT CONTRAST TECHNIQUE: Multidetector CT imaging of the head and neck was performed using the standard protocol during bolus administration of intravenous contrast. Multiplanar CT image reconstructions and MIPs were obtained to evaluate the vascular anatomy. Carotid  stenosis measurements (when applicable) are obtained utilizing NASCET criteria, using the distal internal carotid diameter as the denominator. RADIATION DOSE REDUCTION: This exam was performed according to the departmental dose-optimization program which includes automated exposure control, adjustment of the mA and/or kV according to patient size and/or use of iterative reconstruction technique. CONTRAST:  75mL OMNIPAQUE IOHEXOL 350 MG/ML SOLN COMPARISON:  Earlier today FINDINGS: CTA NECK FINDINGS Aortic arch: Normal Right carotid system: Smoothly contoured and widely patent Left carotid system: Absent ICA other than the small cord like structure extending towards the narrow left carotid canal. This has a congenital appearance Vertebral arteries: The left vertebral artery arises from the arch. The right vertebral artery arises from the subclavian. Skeleton: Negative. Other neck: Negative. Upper chest: Biapical emphysema Review of the MIP images confirms the above findings CTA HEAD FINDINGS Anterior circulation: Reconstituted left ICA from the circle-of-Willis, especially left posterior communicating artery. No branch occlusion, beading, or aneurysm. Posterior circulation: The vertebral and basilar arteries are smoothly contoured and widely patent. No branch occlusion, beading, or aneurysm. Negative for vascular malformation. Venous sinuses: Unremarkable Anatomic variants: As above Review of the MIP images confirms the above findings IMPRESSION: 1. No arterial lesion or spot sign seen at the right cerebral ICH. 2. Absent left ICA with robust intracranial reconstitution. Small left carotid canal suggesting occlusion early in life. 3. Biapical emphysema. Electronically Signed   By: Tiburcio Pea M.D.   On: 06/19/2023 12:19   CT HEAD WO CONTRAST  Result Date: 06/19/2023 CLINICAL DATA:  Loss of vision EXAM: CT HEAD WITHOUT CONTRAST TECHNIQUE: Contiguous axial images were obtained from the base of the skull through  the vertex without intravenous contrast. RADIATION DOSE REDUCTION: This exam was performed according to the departmental dose-optimization program which includes automated exposure control,  adjustment of the mA and/or kV according to patient size and/or use of iterative reconstruction technique. COMPARISON:  None Available. FINDINGS: Brain: 4.2 x 4.6 x 3 cm high-density hematoma in the right parietooccipital region with small volume intraventricular and subarachnoid extension, clot seen layering in the lateral ventricles. No hydrocephalus. The hemorrhage is at the edge of extensive encephalomalacia which affects the bilateral cerebral cortex roughly along the posterior division MCA territory and watershed no midline shift or masslike finding. Vascular: No hyperdense vessel or unexpected calcification. Skull: Normal. Negative for fracture or focal lesion. Sinuses/Orbits: No acute finding. Other: Critical Value/emergent results were called by telephone at the time of interpretation on 06/19/2023 at 10:07 am to provider [facility] PFEIFFER , who is already aware IMPRESSION: 1. 30 cc acute hematoma in the right parieto-occipital region with small volume intraventricular and subarachnoid extension. 2. Extensive encephalomalacia of the bilateral cerebral cortex. Electronically Signed   By: Tiburcio Pea M.D.   On: 06/19/2023 10:08    Vitals:   06/20/23 0500 06/20/23 0600 06/20/23 0700 06/20/23 0800  BP: 107/69 101/70 100/70   Pulse: 64 64 67   Resp: 12 12 (!) 9   Temp:    98.8 F (37.1 C)  TempSrc:    Axillary  SpO2: (!) 88% 95% 91%   Weight:      Height:         PHYSICAL EXAM General:  Alert, well-nourished, well-developed patient in no acute distress Psych:  Mood and affect appropriate for situation CV: Regular rate and rhythm on monitor Respiratory:  Regular, unlabored respirations on room air GI: Abdomen soft and nontender   NEURO:  Mental Status: AA&O to person, place, time. Unable to give clear  history. Cognitive impairment at baseline.  Speech/Language: speech is without dysarthria or aphasia.  Quiet voice. Naming, repetition intact. fluency, and comprehension reduced.  Cranial Nerves:  II: Pupils reactive. Left 4 -> 3, Right 2.5 -> 2. No vision or light perception in either eye.   III, IV, VI: Right gaze preference, but can cross midline when asked to. Does not track. With right eye covered,left eye remains in a fixed right gaze position V: Sensation is intact to light touch and symmetrical to face.  VII: Right facial droop VIII: hearing intact to voice. IX, X: Palate elevates symmetrically. Phonation is normal.  UE:[id number] shrug 5/5. XII: tongue is midline without fasciculations. Motor: BLE with decreased distal strength, decreased dorsiflexion.  Tone: increased in RLE Sensation- Intact to light touch bilaterally. Extinction absent to light touch to DSS.   Coordination: FTN intact bilaterally, HKS: no ataxia in BLE.No drift.  Gait- deferred   ASSESSMENT/PLAN  ICH - Right parieto-occipital ICH with IVH, etiology: 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  CT head  30cc acute hematoma right parieto-occipital region with small volume intraventricular extension and subarachnoid extension.  Extensive encephalomalacia of the bilateral cerebral cortex CTA head & neck  Absent left ICA with robust intracranial reconstitution. Small carotid canal suggests occlusion early in life.  Repeat CT head:  Unchanged large right parieto-occipital intraparenchymal hematoma with small volume intraventricular extension. No new hemorrhage. Stable ventricular size MRI   No mass or vascular lesion seen underlying the acute right occipital parietal hematoma. Unchanged intraventricular and subarachnoid extension without hydrocephalus Extensive chronic cerebral cortex infarcts Extensive cerebral microhemorrhages and hemosiderin deposits 2D Echo: LVEF 60-65%,  No LVH  LDL 46 HgbA1c 6.1 VTE prophylaxis - SCDs aspirin 81 mg daily and Eliquis (apixaban) daily prior  to admission, now on No antithrombotic due to ICH Therapy recommendations:  pending Disposition:  pending  Hx of Stroke/TIA 04/2017: Admitted for headache, right upper extremity numbness and blurry vision.  MRI showed multiple anterior circulation watershed infarcts with chronic posterior circulation watershed infarcts.  MRI showed left ICA chronic occlusion.  Chronic Doppler showed left ICA occlusion but right ICA patent.  TEE negative.  LDL 138.  Loop recorder placed but no A-fib found.  Aspirin increased from 81->325 on discharge, along with Lipitor 80. 09/2022: Admitted for frequent falls and gait difficulty.  MRI showed bilateral multiple punctate cortical and subcortical infarcts.  And progressive leukoencephalopathy.  CT head and neck left ICA chronic occlusion.  Carotid Doppler right ICA patent.  Hypercoagulable workup positive for cardiolipin IgM and phosphatydalserine IgM.  EF 55 to 60%.  LDL 35, A1c 5.6.  Discharged on DAPT. 11/2022, follow-up with Dr. Pearlean Brownie at [facility], repeat hypercoagulable workup, still positive for positive for cardiolipin IgM and phosphatydalserine IgM. At clinic, genetic testing for CADASIL and MELAS was consider but not sure if done. Also [place] lives in [place], not good candidate to refer him to rare genetic  disorder clinic at [facility] to get a definite diagnosis. Pt does have cognitive impairment, prescribed celexa and aricept Per [facility] record, pt was on DAPT until 05/2023 pt was added on eliquis. Then plavix was discontinued one week before current admission.  Severe leukoencephalopathy  Cognitive impairment Possible CAA MRI 04/2017 started to show severe bilateral leukoencephalopathy Much progressed in 05/2022 MRI Continue to severe bilateral leukoencephalopathy in MRIs in 09/2022 and this admission Etiology unclear, concerning for CADASIL, MELAS, or other rare genetic  disorder Deemed not treatable, and not good candidate to refer him to rare genetic  disorder clinic at [facility] to get a definite diagnosis since living in [place]. Serial MRIs also showed progressive cerebral microhemorrhages and hemosiderin deposits, concerning for possible CAA On Aricept  Cortical blindness 09/2022 exam showed More difficulty counting fingers on the right than the left but at times he is able to count correctly on the right side. Pupils, round, and reactive to light with likely physiologic anisocoria with the left pupil slightly larger than the right  Current examination showed bilateral difficulty with light perception, pupils reactive to light, right pupil larger than left, consistent with cortical blindness. Serial MRI and CT in the past showed extensive encephalomalacia which affects the bilateral cerebral cortex roughly along the posterior division MCA territory and watershed area. Now with right parietal occipital large ICH All of the above imaging changes and examinations are consistent with likely cortical blindness.  Possible antiphospholipid syndrome Extensive chronic infarcts bilaterally 09/2022 Hypercoagulable workup positive for cardiolipin IgM and phosphatydalserine IgM.   11/2022 Repeat hypercoagulable workup again positive for cardiolipin IgM and phosphatydalserine IgM. Per record, started on eliquis in 05/2023 along with ASA and plavix. Latter was stopped one week prior to admission  Left ICA occlusion CTA showed Small carotid canal suggests occlusion early in life. Could be developmental  Hx of Aseptic Meningitis  At Age 21 No reported ICU stay or long hospitalization Possible cause of chronic laminar necrosis/leukoencephalopathy Possible cause of cognitive impairment at baseline?  BP management BP stable Blood Pressure Goal: less than 160  Long term BP goal normotensive  Hyperlipidemia Home meds:  lipitor 80mg  LDL 46, goal < 70 Consider restarting  statin at discharge due to ICH Not SATURN candidate due to poor mRS  Tobacco Abuse Patient smokes 1 pack per day for 45 years  Nicotine replacement therapy and cessation education provided   Hospital day # 1   Pt seen by Neuro NP/APP and later by MD. Note/plan to be edited by MD as needed.    Lynnae January, DNP, AGACNP-BC [facility] Please use AMION for contact information & EPIC for messaging.  ATTENDING NOTE: I reviewed above note and agree with the assessment and plan. Pt was seen and examined.   No family bedside.  Patient lying in bed, blind. Not able to have light perception in both eyes.  Orientated to place, age and time, however not orientated to situation, not able to spell world forward or backward.  Eyes right gaze preference, however able to have left gaze.  Left pupil 4->56mm, right pupil 3->54mm, positive bilateral doll's eyes.  No significant facial droop.  Right upper extremity drift, left upper extremity able to hold against gravity.  Bilateral lower extremity increased muscle tone, right more than left.  Bilateral LEs withdrawal to pain. Sensation, coordination not corporative and gait not tested.  Patient has extensive stroke history in the past, with severe bilateral leukocytosis, significant bilateral hemosiderosis, cognitive impairment, and a possible antiphospholipid syndrome.  He has been on Aricept, aspirin, Plavix and Eliquis treatment.  Currently nursing home resident.  He has been followed with Dr. Pearlean Brownie at [facility] for stroke follow-up, with possible testing for CASASIL, MELAS and other rare genetic disorders.  However, currently has right parieto-occipital ICH with cortical blindness, now on no antithrombotics.  BP goal less than 160, repeat CT and MRI stable hematoma. On diet, continue supportive care.  Follow-up as outpatient with Dr. Pearlean Brownie.   For detailed assessment and plan, please refer to above/below as I have made changes wherever appropriate.    This patient is critically ill due to ICH, possible CAA, severe leukoencephalopathy and at significant risk of neurological worsening, death form hematoma expansion, brain herniation, ischemic strokes. This patient's care requires constant monitoring of vital signs, hemodynamics, respiratory and cardiac monitoring, review of multiple databases, neurological assessment, discussion with family, other specialists and medical decision making of high complexity. I spent 40 minutes of neurocritical care time in the care of this patient.  Marvel Plan, MD PhD Stroke Neurology 06/20/2023 3:34 PM   To contact Stroke Continuity provider, please refer to [email or web address]. After hours, contact General Neurology

## 2023-06-21 DIAGNOSIS — I611 Nontraumatic intracerebral hemorrhage in hemisphere, cortical: Secondary | ICD-10-CM

## 2023-06-21 NOTE — Plan of Care (Signed)
  Problem: Education: Goal: Knowledge of disease or condition will improve Outcome: Progressing Goal: Knowledge of secondary prevention will improve (MUST DOCUMENT ALL) Outcome: Progressing Goal: Knowledge of patient specific risk factors will improve (Mark N/A or DELETE if not current risk factor) Outcome: Progressing   Problem: Intracerebral Hemorrhage Tissue Perfusion: Goal: Complications of Intracerebral Hemorrhage will be minimized Outcome: Progressing   Problem: Coping: Goal: Will verbalize positive feelings about self Outcome: Progressing Goal: Will identify appropriate support needs Outcome: Progressing   Problem: Health Behavior/Discharge Planning: Goal: Ability to manage health-related needs will improve Outcome: Progressing Goal: Goals will be collaboratively established with patient/family Outcome: Progressing   Problem: Self-Care: Goal: Ability to participate in self-care as condition permits will improve Outcome: Progressing Goal: Verbalization of feelings and concerns over difficulty with self-care will improve Outcome: Progressing Goal: Ability to communicate needs accurately will improve Outcome: Progressing   Problem: Nutrition: Goal: Risk of aspiration will decrease Outcome: Progressing Goal: Dietary intake will improve Outcome: Progressing   Problem: Education: Goal: Knowledge of General Education information will improve Description: Including pain rating scale, medication(s)/side effects and non-pharmacologic comfort measures Outcome: Progressing   Problem: Health Behavior/Discharge Planning: Goal: Ability to manage health-related needs will improve Outcome: Progressing   Problem: Clinical Measurements: Goal: Ability to maintain clinical measurements within normal limits will improve Outcome: Progressing Goal: Will remain free from infection Outcome: Progressing Goal: Diagnostic test results will improve Outcome: Progressing Goal:  Respiratory complications will improve Outcome: Progressing Goal: Cardiovascular complication will be avoided Outcome: Progressing   Problem: Activity: Goal: Risk for activity intolerance will decrease Outcome: Progressing   Problem: Nutrition: Goal: Adequate nutrition will be maintained Outcome: Progressing   Problem: Coping: Goal: Level of anxiety will decrease Outcome: Progressing   Problem: Elimination: Goal: Will not experience complications related to bowel motility Outcome: Progressing Goal: Will not experience complications related to urinary retention Outcome: Progressing   Problem: Pain Managment: Goal: General experience of comfort will improve Outcome: Progressing   Problem: Safety: Goal: Ability to remain free from injury will improve Outcome: Progressing   Problem: Skin Integrity: Goal: Risk for impaired skin integrity will decrease Outcome: Progressing   

## 2023-06-21 NOTE — Progress Notes (Signed)
STROKE TEAM PROGRESS NOTE   BRIEF HPI [name] is a 57 y.o. male with history of hyperlipidemia, PVD, pacemaker, left carotid occlusion, tobacco abuse and cryptogenic stroke who was admitted from his rehab facility for loss of vision which occurred upon waking up this morning.  Patient states that he can see shapes and lights but is unable to count fingers or make out any details of anything.  LKW 2200 8/17, mRS 4, NIH 5. He was found to have a 30 cc right parieto-occipital ICH with some IVH on CT, ICH score 2.  SIGNIFICANT HOSPITAL EVENTS 8/18: Admitted to ICU due to R parieto-occipital  ICH with IVH.  Repeat CT: Stable 8/19: MRI Stable. Extensive chronic cerebral cortex infarcts.  INTERIM HISTORY/SUBJECTIVE No family at the bedside. Pt lying in bed, listening to TV. He still has cortical blindness and even difficulty with light perception. Overnight on acute change and neuro stable.   OBJECTIVE  CBC    Component Value Date/Time   WBC 8.9 06/20/2023 0149   RBC 4.38 06/20/2023 0149   HGB 12.3 (L) 06/20/2023 0149   HGB 15.3 06/11/2020 1041   HCT 36.7 (L) 06/20/2023 0149   HCT 44.9 06/11/2020 1041   PLT 238 06/20/2023 0149   PLT 230 06/11/2020 1041   MCV 83.8 06/20/2023 0149   MCV 93 06/11/2020 1041   MCH 28.1 06/20/2023 0149   MCHC 33.5 06/20/2023 0149   RDW 14.5 06/20/2023 0149   RDW 13.8 06/11/2020 1041   LYMPHSABS 1.4 06/19/2023 0858   LYMPHSABS 1.7 06/11/2020 1041   MONOABS 0.7 06/19/2023 0858   EOSABS 0.0 06/19/2023 0858   EOSABS 0.1 06/11/2020 1041   BASOSABS 0.0 06/19/2023 0858   BASOSABS 0.0 06/11/2020 1041    BMET    Component Value Date/Time   NA 136 06/20/2023 0149   NA 140 01/11/2022 1121   K 4.2 06/20/2023 0149   CL 104 06/20/2023 0149   CO2 22 06/20/2023 0149   GLUCOSE 99 06/20/2023 0149   BUN 6 06/20/2023 0149   BUN 5 (L) 01/11/2022 1121   CREATININE 1.21 06/20/2023 0149   CREATININE 1.24 04/18/2017 1012   CALCIUM 8.5 (L) 06/20/2023  0149   EGFR 80 01/11/2022 1121   GFRNONAA >60 06/20/2023 0149   GFRNONAA 66 04/18/2017 1012    IMAGING past 24 hours No results found.  Vitals:   06/20/23 2000 06/21/23 0400 06/21/23 0921 06/21/23 1217  BP: 113/77 123/79 121/73 114/71  Pulse: 63 63 67 65  Resp: 18 18 18 20   Temp: 99.1 F (37.3 C) 98.2 F (36.8 C) 98.3 F (36.8 C) 98.3 F (36.8 C)  TempSrc: Oral Oral Oral Oral  SpO2: 97% 97% 98% 95%  Weight:      Height:         PHYSICAL EXAM General:  Alert, well-nourished, well-developed patient in no acute distress Psych:  Mood and affect appropriate for situation CV: Regular rate and rhythm on monitor Respiratory:  Regular, unlabored respirations on room air GI: Abdomen soft and nontender   NEURO:  Patient lying in bed, blind. Not able to have light perception in both eyes.  Orientated to place, age and time, however not orientated to situation, not able to spell world forward or backward.  Eyes right gaze preference, however able to have left gaze.  Left pupil 4->58mm, right pupil 3->42mm, positive bilateral doll's eyes.  No significant facial droop.  Right upper extremity drift, left upper extremity able to hold against gravity.  Bilateral lower extremity increased muscle tone, right more than left.  Bilateral LEs withdrawal to pain. Sensation, coordination not corporative and gait not tested.    ASSESSMENT/PLAN  ICH - Right parieto-occipital ICH with IVH, etiology: 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  CT head  30cc acute hematoma right parieto-occipital region with small volume intraventricular extension and subarachnoid extension.  Extensive encephalomalacia of the bilateral cerebral cortex CTA head & neck  Absent left ICA with robust intracranial reconstitution. Small carotid canal suggests occlusion early in life.  Repeat CT head:  Unchanged large right parieto-occipital intraparenchymal hematoma with small volume  intraventricular extension. No new hemorrhage. Stable ventricular size MRI   No mass or vascular lesion seen underlying the acute right occipital parietal hematoma. Unchanged intraventricular and subarachnoid extension without hydrocephalus Extensive chronic cerebral cortex infarcts Extensive cerebral microhemorrhages and hemosiderin deposits 2D Echo: LVEF 60-65%, No LVH  LDL 46 HgbA1c 6.1 VTE prophylaxis - SCDs aspirin 81 mg daily and Eliquis (apixaban) daily prior to admission, now on No antithrombotic due to ICH Therapy recommendations:  SNF Disposition:  pending, continue to follow up with Dr. Pearlean Brownie at [facility]  Hx of Stroke/TIA 04/2017: Admitted for headache, right upper extremity numbness and blurry vision.  MRI showed multiple anterior circulation watershed infarcts with chronic posterior circulation watershed infarcts.  MRI showed left ICA chronic occlusion.  Chronic Doppler showed left ICA occlusion but right ICA patent.  TEE negative.  LDL 138.  Loop recorder placed but no A-fib found.  Aspirin increased from 81->325 on discharge, along with Lipitor 80. 09/2022: Admitted for frequent falls and gait difficulty.  MRI showed bilateral multiple punctate cortical and subcortical infarcts.  And progressive leukoencephalopathy.  CT head and neck left ICA chronic occlusion.  Carotid Doppler right ICA patent.  Hypercoagulable workup positive for cardiolipin IgM and phosphatydalserine IgM.  EF 55 to 60%.  LDL 35, A1c 5.6.  Discharged on DAPT. 11/2022, follow-up with Dr. Pearlean Brownie at [facility], repeat hypercoagulable workup, still positive for positive for cardiolipin IgM and phosphatydalserine IgM. At clinic, genetic testing for CADASIL and MELAS was consider but not sure if done. Also [place] lives [place], not good candidate to refer him to rare genetic  disorder clinic at [facility] to get a definite diagnosis. Pt does have cognitive impairment, prescribed celexa and aricept Per [facility] record, pt was on DAPT until 05/2023 pt was  added on eliquis. Then plavix was discontinued one week before current admission.  Severe leukoencephalopathy  Cognitive impairment Possible CAA MRI 04/2017 started to show severe bilateral leukoencephalopathy Much progressed in 05/2022 MRI Continue to severe bilateral leukoencephalopathy in MRIs in 09/2022 and this admission Etiology unclear, concerning for CADASIL, MELAS, or other rare genetic disorder Deemed not treatable, and not good candidate to refer him to rare genetic  disorder clinic at [facility] to get a definite diagnosis since living in [place]. Serial MRIs also showed progressive cerebral microhemorrhages and hemosiderin deposits, concerning for possible CAA On Aricept  Cortical blindness 09/2022 exam showed More difficulty counting fingers on the right than the left but at times he is able to count correctly on the right side. Pupils, round, and reactive to light with likely physiologic anisocoria with the left pupil slightly larger than the right  Current examination showed bilateral difficulty with light perception, pupils reactive to light, right pupil larger than left, consistent with cortical blindness. Serial MRI and CT in the past showed extensive encephalomalacia which affects the bilateral cerebral cortex roughly along  the posterior division MCA territory and watershed area. Now with right parietal occipital large ICH All of the above imaging changes and examinations are consistent with likely cortical blindness.  Possible antiphospholipid syndrome Extensive chronic infarcts bilaterally 09/2022 Hypercoagulable workup positive for cardiolipin IgM and phosphatydalserine IgM.   11/2022 Repeat hypercoagulable workup again positive for cardiolipin IgM and phosphatydalserine IgM. Per record, started on eliquis in 05/2023 along with ASA and plavix. Latter was stopped one week prior to admission  Left ICA occlusion CTA showed Small carotid canal suggests occlusion early in life. Could  be developmental  Hx of Aseptic Meningitis  At Age 9 No reported ICU stay or long hospitalization Possible cause of chronic laminar necrosis/leukoencephalopathy Possible cause of cognitive impairment at baseline?  BP management BP stable Blood Pressure Goal: less than 160  Long term BP goal normotensive  Hyperlipidemia Home meds:  lipitor 80mg  LDL 46, goal < 70 Consider restarting statin at discharge due to ICH Not SATURN candidate due to poor mRS  Tobacco Abuse Patient smokes 1 pack per day for 45 years Nicotine replacement therapy and cessation education provided   Hospital day # 2  Neurology will sign off. Please call with questions. Pt will follow up with stroke clinic Dr. Pearlean Brownie at [facility] in about 4 weeks. Thanks for the consult.   Marvel Plan, MD PhD Stroke Neurology 06/21/2023 4:49 PM   To contact Stroke Continuity provider, please refer to [email or web address]. After hours, contact General Neurology

## 2023-06-21 NOTE — Progress Notes (Signed)
PROGRESS NOTE  Daniel Stuart YSA:[id number] DOB: 05/08/1966   PCP: Donell Beers, [name]  Patient is from: Nursing home.  Legally blind.  Wheelchair-bound at baseline.  DOA: 06/19/2023 LOS: 2  Chief complaints Chief Complaint  Patient presents with   Loss of Vision     Brief Narrative / Interim history: 57 year old M with PMH of aseptic meningitis at young age, impaired vision, CVA, cognitive impairment, leukoencephalopathy, chronic left ICA occlusion, PVD, HLD and tobacco use disorder admitted to ICU from nursing home due to sudden vision loss that occurred upon waking up the morning of presentation. Patient states that he can see shapes and lights but is unable to count fingers or make out any details of anything.  LKW 2200 8/17, mRS 4, NIH 5. He was found to have a 30 cc right parieto-occipital ICH with some IVH on CT, ICH score 2.  Patient was on low-dose aspirin and Eliquis.  Repeat CT head and MRI brain with stable ICH and extensive chronic cerebral infarcts.   Patient was transferred to [facility] service on 8/20.  Medically stable for discharge waiting on SNF.   Subjective: Seen and examined earlier this morning.  No major events overnight of this morning.  He reports bilateral feet pain, not new.  No other complaints.  He is awake and alert but only oriented to self and place.  He follows command appropriately.  Objective: Vitals:   06/20/23 2000 06/21/23 0400 06/21/23 0921 06/21/23 1217  BP: 113/77 123/79 121/73 114/71  Pulse: 63 63 67 65  Resp: 18 18 18 20   Temp: 99.1 F (37.3 C) 98.2 F (36.8 C) 98.3 F (36.8 C) 98.3 F (36.8 C)  TempSrc: Oral Oral Oral Oral  SpO2: 97% 97% 98% 95%  Weight:      Height:        Examination:  GENERAL: No apparent distress.  Nontoxic. HEENT: MMM.  Hearing grossly intact.  PERRL. "Can't see" NECK: Supple.  No apparent JVD.  RESP:  No IWOB.  Fair aeration bilaterally. CVS:  RRR. Heart sounds normal.  ABD/GI/GU: BS+.  Abd soft, NTND.  MSK/EXT:  Moves extremities. No apparent deformity. No edema.  SKIN: no apparent skin lesion or wound NEURO: Awake and alert.  Oriented to self and place but not time.  Follows commands.  PERRL.  No facial asymmetry.  Speech clear.  Seems to have RLE contracture. PSYCH: Calm. Normal affect.   Procedures:  None  Microbiology summarized: MRSA PCR screen positive  Assessment and plan: Principal Problem:   ICH (intracerebral hemorrhage) (HCC)  ICH - Right parieto-occipital ICH with IVH, etiology: 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 CT head with 30 cc acute hematoma in right parietal occipital region with a small volume IVH and SAH extension.CTA head & neck absent left ICA with robust intracranial reconstitution. Small carotid canal suggests occlusion early in life.  ICH stable on repeat CT head and MRI brain.  TTE without significant finding.  A1c 6.1%.  LDL 46.  Patient was on aspirin and Eliquis prior to admission. -Neurology signed off -Not on antithrombotic therapy due to ICH. -Therapy recommends SNF.    Hx of Stroke/TIA/progressive leukoencephalopathy/chronic left ICA occlusion: Previous workup with positive hypercoagulable labs and possible antiphospholipid syndrome.  Patient was on DAPT until 05/2023 pt was added on eliquis. Then plavix was discontinued one week before current admission. -Management as above   Severe leukoencephalopathy  Cognitive impairment Possible CAA -Continue home -Reorientation and delirium  precaution  Cortical blindness  Hx of Aseptic Meningitis at the age of 83: Noted  Essential hypertension: Normotensive  Hyperlipidemia: LDL 46.   Tobacco Abuse reportedly smokes about a pack a day -Counseled on cessation -Continue nicotine patch   Body mass index is 21.35 kg/m.          DVT prophylaxis:  SCD's Start: 06/19/23 1117  Code Status: Full code Family Communication: None at  bedside Level of care: Telemetry Medical Status is: Inpatient Remains inpatient appropriate because: Safe disposition/SNF   Final disposition: SNF Consultants:  Neurology admitted patient  35 minutes with more than 50% spent in reviewing records, counseling patient/family and coordinating care.   Sch Meds:  Scheduled Meds:  baclofen  10 mg Oral TID   citalopram  10 mg Oral Daily   mupirocin ointment  1 Application Nasal BID   nicotine  14 mg Transdermal Daily   pantoprazole  40 mg Oral QHS   senna-docusate  1 tablet Oral BID   thiamine  100 mg Oral Daily   Continuous Infusions: PRN Meds:.acetaminophen **OR** acetaminophen (TYLENOL) oral liquid 160 mg/5 mL **OR** acetaminophen  Antimicrobials: Anti-infectives (From admission, onward)    None        I have personally reviewed the following labs and images: CBC: Recent Labs  Lab 06/19/23 0858 06/20/23 0149  WBC 11.3* 8.9  NEUTROABS 9.2*  --   HGB 12.9* 12.3*  HCT 38.8* 36.7*  MCV 84.5 83.8  PLT 253 238   BMP &GFR Recent Labs  Lab 06/19/23 0858 06/20/23 0149  NA 136 136  K 4.4 4.2  CL 99 104  CO2 23 22  GLUCOSE 115* 99  BUN 6 6  CREATININE 1.22 1.21  CALCIUM 9.1 8.5*   Estimated Creatinine Clearance: 55.8 mL/min (by C-G formula based on SCr of 1.21 mg/dL). Liver & Pancreas: Recent Labs  Lab 06/19/23 0858 06/20/23 0149  AST 22 15  ALT 21 18  ALKPHOS 88 84  BILITOT 0.5 0.5  PROT 7.2 6.8  ALBUMIN 3.4* 3.1*   No results for input(s): "LIPASE", "AMYLASE" in the last 168 hours. No results for input(s): "AMMONIA" in the last 168 hours. Diabetic: Recent Labs    06/20/23 0149  HGBA1C 6.1*   No results for input(s): "GLUCAP" in the last 168 hours. Cardiac Enzymes: No results for input(s): "CKTOTAL", "CKMB", "CKMBINDEX", "TROPONINI" in the last 168 hours. No results for input(s): "PROBNP" in the last 8760 hours. Coagulation Profile: Recent Labs  Lab 06/19/23 0858  INR 1.2   Thyroid  Function Tests: No results for input(s): "TSH", "T4TOTAL", "FREET4", "T3FREE", "THYROIDAB" in the last 72 hours. Lipid Profile: Recent Labs    06/20/23 0149  CHOL 91  HDL 32*  LDLCALC 46  TRIG 66  CHOLHDL 2.8   Anemia Panel: No results for input(s): "VITAMINB12", "FOLATE", "FERRITIN", "TIBC", "IRON", "RETICCTPCT" in the last 72 hours. Urine analysis:    Component Value Date/Time   COLORURINE STRAW (A) 06/19/2023 2259   APPEARANCEUR CLEAR 06/19/2023 2259   LABSPEC 1.023 06/19/2023 2259   PHURINE 7.0 06/19/2023 2259   GLUCOSEU NEGATIVE 06/19/2023 2259   HGBUR NEGATIVE 06/19/2023 2259   BILIRUBINUR NEGATIVE 06/19/2023 2259   BILIRUBINUR negative 07/10/2021 1202   BILIRUBINUR neg 06/11/2020 0901   KETONESUR NEGATIVE 06/19/2023 2259   PROTEINUR NEGATIVE 06/19/2023 2259   UROBILINOGEN 0.2 07/10/2021 1202   UROBILINOGEN 0.2 11/30/2017 1030   NITRITE NEGATIVE 06/19/2023 2259   LEUKOCYTESUR NEGATIVE 06/19/2023 2259   Sepsis Labs: Invalid input(s): "  PROCALCITONIN", "LACTICIDVEN"  Microbiology: Recent Results (from the past 240 hour(s))  MRSA Next Gen by PCR, Nasal     Status: Abnormal   Collection Time: 06/19/23 10:59 PM   Specimen: Nasal Mucosa; Nasal Swab  Result Value Ref Range Status   MRSA by PCR Next Gen DETECTED (A) NOT DETECTED Final    Comment: RESULT CALLED TO, READ BACK BY AND VERIFIED WITH: B ALI,RN@0033  06/20/23 MK (NOTE) The GeneXpert MRSA Assay (FDA approved for NASAL specimens only), is one component of a comprehensive MRSA colonization surveillance program. It is not intended to diagnose MRSA infection nor to guide or monitor treatment for MRSA infections. Test performance is not FDA approved in patients less than 44 years old. Performed at [facility], [address] [place], [place] [address]     Radiology Studies: No results found.    Cheridan Kibler T. Oshae Simmering [facility]  If 7PM-7AM, please contact night-coverage [email or web address] 06/21/2023,  12:54 PM

## 2023-06-21 NOTE — Plan of Care (Signed)
  Problem: Education: Goal: Knowledge of disease or condition will improve Outcome: Progressing Goal: Knowledge of secondary prevention will improve (MUST DOCUMENT ALL) Outcome: Progressing Goal: Knowledge of patient specific risk factors will improve Loraine Leriche N/A or DELETE if not current risk factor) Outcome: Progressing   Problem: Intracerebral Hemorrhage Tissue Perfusion: Goal: Complications of Intracerebral Hemorrhage will be minimized Outcome: Progressing   Problem: Coping: Goal: Will verbalize positive feelings about self Outcome: Progressing Goal: Will identify appropriate support needs Outcome: Progressing   Problem: Health Behavior/Discharge Planning: Goal: Ability to manage health-related needs will improve Outcome: Progressing Goal: Goals will be collaboratively established with patient/family Outcome: Progressing   Problem: Self-Care: Goal: Ability to participate in self-care as condition permits will improve Outcome: Progressing Goal: Verbalization of feelings and concerns over difficulty with self-care will improve Outcome: Progressing Goal: Ability to communicate needs accurately will improve Outcome: Progressing   Problem: Nutrition: Goal: Risk of aspiration will decrease Outcome: Progressing Goal: Dietary intake will improve Outcome: Progressing   Problem: Education: Goal: Knowledge of General Education information will improve Description: Including pain rating scale, medication(s)/side effects and non-pharmacologic comfort measures Outcome: Progressing   Problem: Health Behavior/Discharge Planning: Goal: Ability to manage health-related needs will improve Outcome: Progressing

## 2023-06-21 NOTE — Evaluation (Signed)
Speech Language Pathology Evaluation Patient Details Name: Daniel Stuart MRN: [id number] DOB: [birth year] Today's Date: 06/21/2023 Time: 4098-1191 SLP Time Calculation (min) (ACUTE ONLY): 17 min  Problem List:  Patient Active Problem List   Diagnosis Date Noted   ICH (intracerebral hemorrhage) (HCC) 06/19/2023   Left carotid artery occlusion 09/09/2022   Weakness of both lower extremities 09/07/2022   Impaired mobility 08/25/2022   PAD (peripheral artery disease) (HCC) 08/25/2022   Need for influenza vaccination 08/25/2022   Frequent falls 03/11/2020   Arrhythmia as indication for cardiac pacemaker replacement 03/11/2020   Urinary frequency 03/11/2020   Vitamin D deficiency 09/13/2019   S/P stroke due to cerebrovascular disease 03/12/2019   Right sided weakness 03/12/2019   Anxiety 03/12/2019   Hyperlipidemia    Tobacco abuse 04/12/2017   Dyslipidemia 04/12/2017   PVD (peripheral vascular disease) (HCC) 04/12/2017   Carotid arterial disease (HCC) 04/12/2017   CVA (cerebral vascular accident) (HCC) 04/11/2017   Past Medical History:  Past Medical History:  Diagnosis Date   Anxiety    Arrhythmia as indication for cardiac pacemaker replacement 03/2020   Frequent falls 03/2020   History of loop recorder 04/2017   Stroke (cerebrum) (HCC)    Tobacco use    Urinary frequency 09/2019   Vitamin D deficiency    Past Surgical History:  Past Surgical History:  Procedure Laterality Date   FOOT SURGERY     LOOP RECORDER INSERTION N/A 04/13/2017   Procedure: Loop Recorder Insertion;  Surgeon: Marinus Maw, MD;  Location: [facility] INVASIVE CV LAB;  Service: Cardiovascular;  Laterality: N/A;   TEE WITHOUT CARDIOVERSION N/A 04/13/2017   Procedure: TRANSESOPHAGEAL ECHOCARDIOGRAM (TEE);  Surgeon: Jake Bathe, MD;  Location: [facility] ENDOSCOPY;  Service: Cardiovascular;  Laterality: N/A;   HPI:  Patient is 57 y.o. male who was admitted 8/18 for loss of vision. CT revealed a 30 cc right  parieto-occipital ICH with some IVH. PMH includes: HLD, PVD, pacemaker, left carotid occlusion, tobacco abuse and cryptogenic stroke. Pt is resident at [facility] and at baseline has some ambulation difficulty reliant RW and some speech difficulty. Most recent SLP eval in November 2023 showed moderate cognitive-linguistic deficits.   Assessment / Plan / Recommendation Clinical Impression  Pt is suspected to be near his cognitive-linguistic baseline based upon previous documentation available. Command following is actually improved especially at the two-step level. He may have some increased difficulty with automatic speech tasks, but his spontaneous communication seems to be similar. He has delays in response and sometimes says "I don't know" but will then benefit from cues. Pt believes that his vision loss is his main acute change, but says that he was getting speech therapy at SNF PTA. Recommend that he be reassessed by primary SLP at SNF upon return to resume services.    SLP Assessment  SLP Recommendation/Assessment: All further Speech Lanaguage Pathology  needs can be addressed in the next venue of care SLP Visit Diagnosis: Cognitive communication deficit (R41.841)    Recommendations for follow up therapy are one component of a multi-disciplinary discharge planning process, led by the attending physician.  Recommendations may be updated based on patient status, additional functional criteria and insurance authorization.    Follow Up Recommendations  Skilled nursing-short term rehab (<3 hours/day)    Assistance Recommended at Discharge  Frequent or constant Supervision/Assistance  Functional Status Assessment    Frequency and Duration           SLP Evaluation Cognition  Overall Cognitive Status: Difficult  to assess Arousal/Alertness: Awake/alert Orientation Level: Oriented X4       Comprehension  Auditory Comprehension Overall Auditory Comprehension: Impaired Commands: Impaired One Step  Basic Commands: 75-100% accurate Two Step Basic Commands: 75-100% accurate Conversation: Simple Interfering Components: Processing speed EffectiveTechniques: Extra processing time    Expression Expression Primary Mode of Expression: Verbal Verbal Expression Overall Verbal Expression: Impaired Automatic Speech:  (impaired alphabet, DOW) Level of Generative/Spontaneous Verbalization: Sentence Pragmatics: Impairment Impairments: Abnormal affect Interfering Components: Premorbid deficit Non-Verbal Means of Communication: Not applicable   Oral / Motor  Motor Speech Overall Motor Speech: Appears within functional limits for tasks assessed             Mahala Menghini., M.A. CCC-SLP Acute Rehabilitation Services Office [phone]  Secure chat preferred  06/21/2023, 2:15 PM

## 2023-06-22 DIAGNOSIS — I611 Nontraumatic intracerebral hemorrhage in hemisphere, cortical: Secondary | ICD-10-CM | POA: Diagnosis not present

## 2023-06-22 MED ORDER — ACETAMINOPHEN 325 MG PO TABS: 650.0000 mg | ORAL_TABLET | Freq: Four times a day (QID) | ORAL | Status: AC | PRN

## 2023-06-22 MED ORDER — SENNOSIDES-DOCUSATE SODIUM 8.6-50 MG PO TABS: 1.0000 | ORAL_TABLET | Freq: Two times a day (BID) | ORAL | Status: DC

## 2023-06-22 MED ORDER — PANTOPRAZOLE SODIUM 40 MG PO TBEC: 40.0000 mg | DELAYED_RELEASE_TABLET | Freq: Every day | ORAL | Status: AC

## 2023-06-22 MED ORDER — NICOTINE 14 MG/24HR TD PT24: 14.0000 mg | MEDICATED_PATCH | Freq: Every day | TRANSDERMAL | 0 refills | Status: DC

## 2023-06-22 NOTE — [place]2 (Signed)
[place] MEDICAID [place]2 LEVEL OF CARE FORM     IDENTIFICATION  Patient Name: Daniel Stuart Birthdate: Jan 06, 1966 Sex: male Admission Date (Current Location): 06/19/2023  [facility] and [place] Number:  Producer, television/film/video and Address:  [facility] [place], [address] [place], [place] [address]      Provider Number: [id number]  Attending Physician Name and Address:  Elease Etienne, MD  Relative Name and Phone Number:       Current Level of Care: Hospital Recommended Level of Care: Skilled Nursing Facility Prior Approval Number:    Date Approved/Denied:   PASRR Number:    Discharge Plan: SNF    Current Diagnoses: Patient Active Problem List   Diagnosis Date Noted   ICH (intracerebral hemorrhage) (HCC) 06/19/2023   Left carotid artery occlusion 09/09/2022   Weakness of both lower extremities 09/07/2022   Impaired mobility 08/25/2022   PAD (peripheral artery disease) (HCC) 08/25/2022   Need for influenza vaccination 08/25/2022   Frequent falls 03/11/2020   Arrhythmia as indication for cardiac pacemaker replacement 03/11/2020   Urinary frequency 03/11/2020   Vitamin D deficiency 09/13/2019   S/P stroke due to cerebrovascular disease 03/12/2019   Right sided weakness 03/12/2019   Anxiety 03/12/2019   Hyperlipidemia    Tobacco abuse 04/12/2017   Dyslipidemia 04/12/2017   PVD (peripheral vascular disease) (HCC) 04/12/2017   Carotid arterial disease (HCC) 04/12/2017   CVA (cerebral vascular accident) (HCC) 04/11/2017    Orientation RESPIRATION BLADDER Height & Weight     Self, Time, Situation, Place  Normal Incontinent Weight: 132 lb 4.4 oz (60 kg) Height:  5\' 6"  (167.6 cm)  BEHAVIORAL SYMPTOMS/MOOD NEUROLOGICAL BOWEL NUTRITION STATUS      Incontinent Diet (low sodium heart healthy)  AMBULATORY STATUS COMMUNICATION OF NEEDS Skin   Extensive Assist Verbally Other (Comment) (MASD, scrotum: moisture barrier PRN)                        Personal Care Assistance Level of Assistance  Bathing, Feeding, Dressing Bathing Assistance: Maximum assistance Feeding assistance: Limited assistance Dressing Assistance: Maximum assistance     Functional Limitations Info  Sight Sight Info: Impaired        SPECIAL CARE FACTORS FREQUENCY  PT (By licensed PT), OT (By licensed OT)     PT Frequency: 5x/wk OT Frequency: 5x/wk            Contractures Contractures Info: Not present    Additional Factors Info  Code Status, Allergies, Psychotropic Code Status Info: Full Allergies Info: NKA Psychotropic Info: Celexa 10mg  daily         Current Medications (06/22/2023):  This is the current hospital active medication list Current Facility-Administered Medications  Medication Dose Route Frequency Provider Last Rate Last Admin   acetaminophen (TYLENOL) tablet 650 mg  650 mg Oral Q4H PRN de Saintclair Halsted, Cortney E, NP   650 mg at 06/22/23 0501   Or   acetaminophen (TYLENOL) 160 MG/5ML solution 650 mg  650 mg Per Tube Q4H PRN de Saintclair Halsted, Cortney E, NP       Or   acetaminophen (TYLENOL) suppository 650 mg  650 mg Rectal Q4H PRN de Saintclair Halsted, Cortney E, NP       baclofen (LIORESAL) tablet 10 mg  10 mg Oral TID Hetty Blend C, NP   10 mg at 06/22/23 0857   citalopram (CELEXA) tablet 10 mg  10 mg Oral Daily Lynnae January,  NP   10 mg at 06/22/23 0857   mupirocin ointment (BACTROBAN) 2 % 1 Application  1 Application Nasal BID Erick Blinks, MD   1 Application at 06/22/23 0858   nicotine (NICODERM CQ - dosed in mg/24 hours) patch 14 mg  14 mg Transdermal Daily Hetty Blend C, NP   14 mg at 06/22/23 0857   pantoprazole (PROTONIX) EC tablet 40 mg  40 mg Oral QHS Jefferson Fuel, MD   40 mg at 06/21/23 2252   senna-docusate (Senokot-S) tablet 1 tablet  1 tablet Oral BID de Saintclair Halsted, Cortney E, NP   1 tablet at 06/22/23 1610   thiamine (VITAMIN B1) tablet 100 mg  100 mg Oral Daily Hetty Blend C, NP   100 mg at 06/22/23 9604      Discharge Medications: Please see discharge summary for a list of discharge medications.  Relevant Imaging Results:  Relevant Lab Results:   Additional Information SS#: [id number]  Baldemar Lenis, LCSW

## 2023-06-22 NOTE — Discharge Summary (Addendum)
Physician Discharge Summary  Daniel Stuart ZOX:[id number] DOB: 10/02/1966  PCP: Donell Beers, [name]  Admitted from: Home Discharged to: SNF  Admit date: 06/19/2023 Discharge date: 06/22/2023  Recommendations for Outpatient Follow-up:    Follow-up Information     Micki Riley, MD. Schedule an appointment as soon as possible for a visit in 1 month(s).   Specialties: Neurology, Radiology Why: stroke clinic Contact information: [address] [place] [address] [phone]         MD at SNF Follow up in 3 day(s).   Why: To be seen with repeat labs (CBC & BMP).  Consider outpatient ophthalmology consultation and follow-up.        Donell Beers, [name]. Schedule an appointment as soon as possible for a visit.   Specialty: Nurse Practitioner Why: To be seen upon discharge from SNF. Contact information: [address] [place] [address] [phone]                  Home Health: None    Equipment/Devices: TBD at SNF    Discharge Condition: Improved and stable   Code Status: Full Code Diet recommendation:  Discharge Diet Orders (From admission, onward)     Start     Ordered   06/22/23 0000  Diet - low sodium heart healthy        06/22/23 1101             Discharge Diagnoses:  Principal Problem:   ICH (intracerebral hemorrhage) (HCC) Active Problems:   CVA (cerebral vascular accident) (HCC)   Tobacco abuse   Carotid arterial disease (HCC)   Impaired mobility   PAD (peripheral artery disease) (HCC)   Left carotid artery occlusion   Brief Summary: [name] is a 57 y.o. male with history of hyperlipidemia, PVD, pacemaker, left carotid occlusion, tobacco abuse and cryptogenic stroke who was admitted from his rehab facility for loss of vision which occurred upon waking up this morning.  Patient stated that he can see shapes and lights but is unable to count fingers or make out any  details of anything.  LKW 2200 8/17, mRS 4, NIH 5. He was found to have a 30 cc right parieto-occipital ICH with some IVH on CT, ICH score 2.  Patient was on low-dose aspirin, Plavix and Eliquis all of which have been discontinued due to ICH.  Patient was transferred [facility] on 8/20 and was medically stable for DC to SNF upon transfer.   SIGNIFICANT HOSPITAL EVENTS 8/18: Admitted to ICU due to R parieto-occipital  ICH with IVH.  Repeat CT: Stable 8/19: MRI Stable. Extensive chronic cerebral cortex infarcts.  Assessment and plan:  ICH - Right parieto-occipital ICH with IVH, etiology: 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 CT head with 30 cc acute hematoma in right parietal occipital region with a small volume IVH and SAH extension.CTA head & neck absent left ICA with robust intracranial reconstitution. Small carotid canal suggests occlusion early in life.  ICH stable on repeat CT head and MRI brain.  TTE without significant finding.  A1c 6.1%.  LDL 46.  Patient was on aspirin and Eliquis prior to admission.  Aspirin, Plavix and Eliquis have all been since discontinued. -Neurology signed off 8/20 -Not on antithrombotic therapy due to ICH. -Therapy recommends SNF.  Per TOC, patient has a bed available for DC today.  Patient is medically optimized. -Outpatient follow-up with  Dr. Pearlean Brownie at [facility].     Hx of Stroke/TIA/progressive leukoencephalopathy/chronic left ICA occlusion: Previous workup with positive hypercoagulable labs and possible antiphospholipid syndrome.  Patient was on DAPT until 05/2023 pt was added on eliquis. Then plavix was discontinued one week before current admission. -Management as above   Severe leukoencephalopathy  Cognitive impairment Possible CAA -Continue home -Reorientation and delirium precaution -Etiology unclear.  For details on his entire neurology evaluation and workup, kindly refer to detailed signoff  note from Dr. Marvel Plan, neurology on 8/20. -Per neurology, deemed not treatable and not good candidate to refer him to a genetic disorder clinic at [facility] to get a definitive diagnosis since living in [place]. -Was not taking Aricept PTA.  Cortical blindness -As per neurology signoff note from 8/20, imaging and serial exams consistent with likely cortical blindness. -Currently has perception of light bilaterally. -Could consider outpatient ophthalmology consultation but not sure if there will be much benefit to it.  Hx of Aseptic Meningitis at the age of 26: Noted   Essential hypertension: Normotensive   Hyperlipidemia:  LDL 46.  As per neurology recommendations, starting back home dose of statins.  Not SATURN candidate due to poor mRS   Tobacco Abuse reportedly smokes about a pack a day -Counseled on cessation -Continue nicotine patch  Left ICA occlusion: Per neurology, could be developmental.  Noted.  Possible antiphospholipid syndrome: Workup as outlined in neurology signed off note from 8/20.  Now off of all antiplatelets and anticoagulation due to ICH.  Consultations: Neurology  Procedures: None   Discharge Instructions  Discharge Instructions     Ambulatory Referral for Lung Cancer Scre   Complete by: As directed    Ambulatory referral to Neurology   Complete by: As directed    Follow up with Dr. Pearlean Brownie at [facility] in 4-6 weeks. Too complicated for NP to follow. Thanks.   Call MD for:   Complete by: As directed    Stroke like symptoms.   Call MD for:  difficulty breathing, headache or visual disturbances   Complete by: As directed    Call MD for:  extreme fatigue   Complete by: As directed    Call MD for:  persistant dizziness or light-headedness   Complete by: As directed    Call MD for:  persistant nausea and vomiting   Complete by: As directed    Call MD for:  severe uncontrolled pain   Complete by: As directed    Call MD for:  temperature >100.4   Complete by: As  directed    Diet - low sodium heart healthy   Complete by: As directed    Increase activity slowly   Complete by: As directed    No wound care   Complete by: As directed         Medication List     STOP taking these medications    aspirin EC 81 MG tablet   clopidogrel 75 MG tablet Commonly known as: PLAVIX   donepezil 10 MG tablet Commonly known as: ARICEPT   Eliquis 5 MG Tabs tablet Generic drug: apixaban   HYDROcodone-acetaminophen 5-325 MG tablet Commonly known as: NORCO/VICODIN       TAKE these medications    acetaminophen 325 MG tablet Commonly known as: TYLENOL Take 2 tablets (650 mg total) by mouth every 6 (six) hours as needed for mild pain, moderate pain, fever or headache (or temp > 37.5 C (99.5 F)). What changed:  medication strength how much to take reasons  to take this   atorvastatin 80 MG tablet Commonly known as: LIPITOR TAKE 1 TABLET (80 MG TOTAL) BY MOUTH EVERY EVENING. What changed:  how much to take how to take this when to take this   baclofen 10 MG tablet Commonly known as: LIORESAL Take 10 mg by mouth 3 (three) times daily.   cholecalciferol 25 MCG (1000 UNIT) tablet Commonly known as: VITAMIN D3 Take 1,000 Units by mouth daily.   citalopram 10 MG tablet Commonly known as: CeleXA Take 1 tablet (10 mg total) by mouth daily.   cyanocobalamin 1000 MCG tablet Take 1 tablet (1,000 mcg total) by mouth daily.   gabapentin 300 MG capsule Commonly known as: NEURONTIN TAKE 1 CAPSULE BY MOUTH BY MOUTH TWO TIMES DAILY What changed:  how much to take how to take this when to take this   nicotine 14 mg/24hr patch Commonly known as: NICODERM CQ - dosed in mg/24 hours Place 1 patch (14 mg total) onto the skin daily. Start taking on: June 23, 2023   oxybutynin 10 MG 24 hr tablet Commonly known as: DITROPAN-XL Take 10 mg by mouth at bedtime.   pantoprazole 40 MG tablet Commonly known as: PROTONIX Take 1 tablet (40 mg total)  by mouth at bedtime.   senna-docusate 8.6-50 MG tablet Commonly known as: Senokot-S Take 1 tablet by mouth 2 (two) times daily.   thiamine 100 MG tablet Commonly known as: VITAMIN B1 Take 1 tablet (100 mg total) by mouth daily.       No Known Allergies    Procedures/Studies: MR BRAIN W WO CONTRAST  Result Date: 06/20/2023 CLINICAL DATA:  Hemorrhagic stroke.  Loss of vision. EXAM: MRI HEAD WITHOUT AND WITH CONTRAST TECHNIQUE: Multiplanar, multiecho pulse sequences of the brain and surrounding structures were obtained without and with intravenous contrast. CONTRAST:  6mL GADAVIST GADOBUTROL 1 MMOL/ML IV SOLN COMPARISON:  Head CT and CTA from yesterday FINDINGS: Brain: Large acute hematoma in the right parieto-occipital lobe, peripheral in location with some early methemoglobin. Small volume intraventricular and subarachnoid extension is noted previously by CT. No evidence of large territory infarct underlying the hemorrhage when accounting for expected susceptibility artifact at the periphery due to blood products. Extensive chronic infarct affecting the cortex roughly along the bilateral watershed. No mass, shift, or extra-axial collection. Chronic blood products along the sites of prior infarction. Vascular: Absent left ICA at the skull base and upper left neck, see prior CTA report. The dural sinuses are patent. Skull and upper cervical spine: Unremarkable Sinuses/Orbits: Unremarkable IMPRESSION: 1. No mass or vascular lesion seen underlying the acute right occipital parietal hematoma. Unchanged intraventricular and subarachnoid extension without hydrocephalus. 2. Extensive chronic cerebral cortex infarcts. Electronically Signed   By: Tiburcio Pea M.D.   On: 06/20/2023 12:02   ECHOCARDIOGRAM LIMITED  Result Date: 06/19/2023    ECHOCARDIOGRAM REPORT   Patient Name:   Daniel Stuart Date of Exam: 06/19/2023 Medical Rec #:  [id number]         Height:       66.0 in Accession #:    [id number]         Weight:       132.3 lb Date of Birth:  [birth year]         BSA:          1.678 m Patient Age:    57 years          BP:           117/80  mmHg Patient Gender: M                 HR:           74 bpm. Exam Location:  Inpatient Procedure: 2D Echo, Cardiac Doppler and Color Doppler Indications:    Stroke I63.9  History:        Patient has prior history of Echocardiogram examinations, most                 recent 09/08/2022. Pacemaker, PAD and Carotid Disease; Risk                 Factors:Current Smoker and Dyslipidemia.  Sonographer:    Dondra Prader RVT Referring Phys: [id number] Lennox Solders [place]  Sonographer Comments: Patient unable to tolerate exam and told sonographer he wanted to stop. Patient appeared confused and agitated. IMPRESSIONS  1. Limited study as pt would not continue after initial apical images; all views not obtained.  2. Left ventricular ejection fraction, by estimation, is 60 to 65%. The left ventricle has normal function. The left ventricle has no regional wall motion abnormalities. Left ventricular diastolic function could not be evaluated.  3. Right ventricular systolic function is normal. The right ventricular size is normal.  4. The mitral valve is normal in structure. No evidence of mitral valve regurgitation.  5. Tricuspid valve regurgitation not assessed.  6. The aortic valve is tricuspid. Aortic valve regurgitation is mild. Aortic valve sclerosis/calcification is present, without any evidence of aortic stenosis. FINDINGS  Left Ventricle: Left ventricular ejection fraction, by estimation, is 60 to 65%. The left ventricle has normal function. The left ventricle has no regional wall motion abnormalities. The left ventricular internal cavity size was normal in size. There is  no left ventricular hypertrophy. Left ventricular diastolic function could not be evaluated. Right Ventricle: The right ventricular size is normal. Right ventricular systolic function is normal. Left Atrium: Left atrial  size was normal in size. Right Atrium: Right atrial size was normal in size. Pericardium: There is no evidence of pericardial effusion. Mitral Valve: The mitral valve is normal in structure. No evidence of mitral valve regurgitation. Tricuspid Valve: The tricuspid valve is normal in structure. Tricuspid valve regurgitation not assessed. Aortic Valve: The aortic valve is tricuspid. Aortic valve regurgitation is mild. Aortic valve sclerosis/calcification is present, without any evidence of aortic stenosis. Pulmonic Valve: The pulmonic valve was not well visualized. Pulmonic valve regurgitation is not visualized. No evidence of pulmonic stenosis. Aorta: The aortic root is normal in size and structure. IAS/Shunts: The interatrial septum was not assessed. Additional Comments: Limited study as pt would not continue after initial apical images; all views not obtained.  LEFT VENTRICLE PLAX 2D LVIDd:         3.50 cm LVIDs:         2.60 cm LV PW:         0.90 cm LV IVS:        0.90 cm LVOT diam:     1.90 cm LVOT Area:     2.84 cm  LEFT ATRIUM         Index LA diam:    2.40 cm 1.43 cm/m  AORTIC VALVE               PULMONIC VALVE AR Vena Contracta: 0.20 cm PV Vmax:       0.96 m/s  PV Peak grad:  3.6 mmHg AORTA Ao Root diam: 2.80 cm Ao Asc diam:  2.90 cm MITRAL VALVE MV Area (PHT): 3.17 cm     SHUNTS MV Decel Time: 239 msec     Systemic Diam: 1.90 cm MV E velocity: 73.90 cm/s MV A velocity: 111.00 cm/s MV E/A ratio:  0.67 Olga Millers MD Electronically signed by Olga Millers MD Signature Date/Time: 06/19/2023/5:08:55 PM    Final    CT HEAD WO CONTRAST  Result Date: 06/19/2023 CLINICAL DATA:  Stroke, hemorrhagic. EXAM: CT HEAD WITHOUT CONTRAST TECHNIQUE: Contiguous axial images were obtained from the base of the skull through the vertex without intravenous contrast. RADIATION DOSE REDUCTION: This exam was performed according to the departmental dose-optimization program which includes automated  exposure control, adjustment of the mA and/or kV according to patient size and/or use of iterative reconstruction technique. COMPARISON:  Head CT 06/19/2023 at 0955 hours. FINDINGS: Brain: Unchanged large right parieto-occipital intraparenchymal hematoma with small volume intraventricular extension. No new hemorrhage. Stable ventricular size. No midline shift. Unchanged findings from old bilateral frontal parietal and left occipital lobe infarcts. No new loss of gray-white differentiation. Vascular: Circulating contrast from recent CTA. Skull: No calvarial fracture or suspicious bone lesion. Skull base is unremarkable. Sinuses/Orbits: No acute findings. Other: None. IMPRESSION: Unchanged large right parieto-occipital intraparenchymal hematoma with small volume intraventricular extension. No new hemorrhage. Stable ventricular size. Electronically Signed   By: Orvan Falconer M.D.   On: 06/19/2023 16:26   CT ANGIO HEAD NECK W WO CM  Result Date: 06/19/2023 CLINICAL DATA:  Hemorrhagic stroke EXAM: CT ANGIOGRAPHY HEAD AND NECK WITH AND WITHOUT CONTRAST TECHNIQUE: Multidetector CT imaging of the head and neck was performed using the standard protocol during bolus administration of intravenous contrast. Multiplanar CT image reconstructions and MIPs were obtained to evaluate the vascular anatomy. Carotid stenosis measurements (when applicable) are obtained utilizing NASCET criteria, using the distal internal carotid diameter as the denominator. RADIATION DOSE REDUCTION: This exam was performed according to the departmental dose-optimization program which includes automated exposure control, adjustment of the mA and/or kV according to patient size and/or use of iterative reconstruction technique. CONTRAST:  75mL OMNIPAQUE IOHEXOL 350 MG/ML SOLN COMPARISON:  Earlier today FINDINGS: CTA NECK FINDINGS Aortic arch: Normal Right carotid system: Smoothly contoured and widely patent Left carotid system: Absent ICA other than  the small cord like structure extending towards the narrow left carotid canal. This has a congenital appearance Vertebral arteries: The left vertebral artery arises from the arch. The right vertebral artery arises from the subclavian. Skeleton: Negative. Other neck: Negative. Upper chest: Biapical emphysema Review of the MIP images confirms the above findings CTA HEAD FINDINGS Anterior circulation: Reconstituted left ICA from the circle-of-Willis, especially left posterior communicating artery. No branch occlusion, beading, or aneurysm. Posterior circulation: The vertebral and basilar arteries are smoothly contoured and widely patent. No branch occlusion, beading, or aneurysm. Negative for vascular malformation. Venous sinuses: Unremarkable Anatomic variants: As above Review of the MIP images confirms the above findings IMPRESSION: 1. No arterial lesion or spot sign seen at the right cerebral ICH. 2. Absent left ICA with robust intracranial reconstitution. Small left carotid canal suggesting occlusion early in life. 3. Biapical emphysema. Electronically Signed   By: Tiburcio Pea M.D.   On: 06/19/2023 12:19   CT HEAD WO CONTRAST  Result Date: 06/19/2023 CLINICAL DATA:  Loss of vision EXAM: CT HEAD WITHOUT CONTRAST TECHNIQUE: Contiguous axial images were obtained from the base of the skull through the  vertex without intravenous contrast. RADIATION DOSE REDUCTION: This exam was performed according to the departmental dose-optimization program which includes automated exposure control, adjustment of the mA and/or kV according to patient size and/or use of iterative reconstruction technique. COMPARISON:  None Available. FINDINGS: Brain: 4.2 x 4.6 x 3 cm high-density hematoma in the right parietooccipital region with small volume intraventricular and subarachnoid extension, clot seen layering in the lateral ventricles. No hydrocephalus. The hemorrhage is at the edge of extensive encephalomalacia which affects the  bilateral cerebral cortex roughly along the posterior division MCA territory and watershed no midline shift or masslike finding. Vascular: No hyperdense vessel or unexpected calcification. Skull: Normal. Negative for fracture or focal lesion. Sinuses/Orbits: No acute finding. Other: Critical Value/emergent results were called by telephone at the time of interpretation on 06/19/2023 at 10:07 am to provider [facility] PFEIFFER , who is already aware IMPRESSION: 1. 30 cc acute hematoma in the right parieto-occipital region with small volume intraventricular and subarachnoid extension. 2. Extensive encephalomalacia of the bilateral cerebral cortex. Electronically Signed   By: Tiburcio Pea M.D.   On: 06/19/2023 10:08      Subjective: Patient denies complaints.  Reports ongoing inability to see in both eyes, darkness but cannot perceive light on flashing light in front of both of his eyes.  No other complaints reported.  As per RN, no acute issues noted.  Discharge Exam:  Vitals:   06/21/23 2101 06/22/23 0015 06/22/23 0401 06/22/23 0714  BP: 115/67 111/78 113/72 120/82  Pulse: 60 66 81 68  Resp: 17   16  Temp: 98.1 F (36.7 C) 97.7 F (36.5 C) 98 F (36.7 C) 98.7 F (37.1 C)  TempSrc: Oral Oral Oral Oral  SpO2: 94% 94% 94%   Weight:      Height:        General: Pt lying comfortably in bed & appears in no obvious distress.  Young male, moderately built and nourished lying comfortably propped up in bed. Cardiovascular: S1 & S2 heard, RRR, S1/S2 +. No murmurs, rubs, gallops or clicks. No JVD or pedal edema. Respiratory: Clear to auscultation without wheezing, rhonchi or crackles. No increased work of breathing. Abdominal:  Non distended, non tender & soft. No organomegaly or masses appreciated. Normal bowel sounds heard. CNS: Alert and oriented.  Bilateral blindness and only perception of light.  Right pupil slightly smaller, 3 mm compared to left 4 mm, both reactive to light. Extremities: no  edema, no cyanosis    The results of significant diagnostics from this hospitalization (including imaging, microbiology, ancillary and laboratory) are listed below for reference.     Microbiology: Recent Results (from the past 240 hour(s))  MRSA Next Gen by PCR, Nasal     Status: Abnormal   Collection Time: 06/19/23 10:59 PM   Specimen: Nasal Mucosa; Nasal Swab  Result Value Ref Range Status   MRSA by PCR Next Gen DETECTED (A) NOT DETECTED Final    Comment: RESULT CALLED TO, READ BACK BY AND VERIFIED WITH: B ALI,RN@0033  06/20/23 MK (NOTE) The GeneXpert MRSA Assay (FDA approved for NASAL specimens only), is one component of a comprehensive MRSA colonization surveillance program. It is not intended to diagnose MRSA infection nor to guide or monitor treatment for MRSA infections. Test performance is not FDA approved in patients less than 25 years old. Performed at [facility], [address], [place], [place] [address]      Labs: CBC: Recent Labs  Lab 06/19/23 0858 06/20/23 0149  WBC  11.3* 8.9  NEUTROABS 9.2*  --   HGB 12.9* 12.3*  HCT 38.8* 36.7*  MCV 84.5 83.8  PLT 253 238    Basic Metabolic Panel: Recent Labs  Lab 06/19/23 0858 06/20/23 0149  NA 136 136  K 4.4 4.2  CL 99 104  CO2 23 22  GLUCOSE 115* 99  BUN 6 6  CREATININE 1.22 1.21  CALCIUM 9.1 8.5*    Liver Function Tests: Recent Labs  Lab 06/19/23 0858 06/20/23 0149  AST 22 15  ALT 21 18  ALKPHOS 88 84  BILITOT 0.5 0.5  PROT 7.2 6.8  ALBUMIN 3.4* 3.1*    CBG: No results for input(s): "GLUCAP" in the last 168 hours.  Hgb A1c Recent Labs    06/20/23 0149  HGBA1C 6.1*    Lipid Profile Recent Labs    06/20/23 0149  CHOL 91  HDL 32*  LDLCALC 46  TRIG 66  CHOLHDL 2.8     Urinalysis    Component Value Date/Time   COLORURINE STRAW (A) 06/19/2023 2259   APPEARANCEUR CLEAR 06/19/2023 2259   LABSPEC 1.023 06/19/2023 2259   PHURINE 7.0 06/19/2023 2259   GLUCOSEU NEGATIVE  06/19/2023 2259   HGBUR NEGATIVE 06/19/2023 2259   BILIRUBINUR NEGATIVE 06/19/2023 2259   BILIRUBINUR negative 07/10/2021 1202   BILIRUBINUR neg 06/11/2020 0901   KETONESUR NEGATIVE 06/19/2023 2259   PROTEINUR NEGATIVE 06/19/2023 2259   UROBILINOGEN 0.2 07/10/2021 1202   UROBILINOGEN 0.2 11/30/2017 1030   NITRITE NEGATIVE 06/19/2023 2259   LEUKOCYTESUR NEGATIVE 06/19/2023 2259    I discussed in detail with patient's niece prior to discharge.  Updated care and answered all questions.  Time coordinating discharge: 25 minutes  SIGNED:  Marcellus Scott, MD,  FACP, [facility], [facility], [facility], [facility]   [facility] Ad[facility] [place]      To contact the attending provider between 7A-7P or the covering provider during after hours 7P-7A, please log into the web site [email or web address] and access using universal [place] password for that web site. If you do not have the password, please call the hospital operator.

## 2023-06-22 NOTE — Plan of Care (Signed)
  Problem: Education: Goal: Knowledge of disease or condition will improve Outcome: Progressing   Problem: Self-Care: Goal: Ability to participate in self-care as condition permits will improve Outcome: Progressing   Problem: Education: Goal: Knowledge of General Education information will improve Description: Including pain rating scale, medication(s)/side effects and non-pharmacologic comfort measures Outcome: Progressing   Problem: Nutrition: Goal: Adequate nutrition will be maintained Outcome: Progressing   Problem: Coping: Goal: Level of anxiety will decrease Outcome: Progressing   Problem: Elimination: Goal: Will not experience complications related to bowel motility Outcome: Progressing Goal: Will not experience complications related to urinary retention Outcome: Progressing   Problem: Pain Managment: Goal: General experience of comfort will improve Outcome: Progressing   Problem: Safety: Goal: Ability to remain free from injury will improve Outcome: Progressing   Problem: Skin Integrity: Goal: Risk for impaired skin integrity will decrease Outcome: Progressing

## 2023-06-22 NOTE — TOC Transition Note (Signed)
Transition of Care [facility]) - CM/SW Discharge Note   Patient Details  Name: Daniel Stuart MRN: [id number] Date of Birth: May 20, 1966  Transition of Care [facility]) CM/SW Contact:  Baldemar Lenis, LCSW Phone Number: 06/22/2023, 12:19 PM   Clinical Narrative:   CSW confirmed with MD that patient is medically stable, can return to SNF today. [facility] able to accept. CSW met with patient, he is in agreement. Patient requested CSW update his sister. CSW called sister, [name], and she is also in agreement. Transport arranged with [facility] for next available.  Nurse to call report to [phone], Room 201B.    Final next level of care: Skilled Nursing Facility Barriers to Discharge: Barriers Resolved   Patient Goals and CMS Choice CMS Medicare.gov Compare Post Acute Care list provided to:: Patient Choice offered to / list presented to : Patient  Discharge Placement                Patient [facility] bed at: [facility] Patient to be transferred to facility by: [facility] Name of family member notified: Self, [name] Patient and family notified of of transfer: 06/22/23  Discharge Plan and Services Additional resources added to the After Visit Summary for       Post Acute Care Choice: Skilled Nursing Facility                               Social Determinants of Health (SDOH) Interventions SDOH Screenings   Food Insecurity: No Food Insecurity (07/31/2021)  Housing: Low Risk  (07/31/2021)  Transportation Needs: No Transportation Needs (07/31/2021)  Alcohol Screen: Low Risk  (07/31/2021)  Depression (PHQ2-9): Low Risk  (04/13/2022)  Financial Resource Strain: Medium Risk (07/31/2021)  Physical Activity: Inactive (07/31/2021)  Social Connections: Socially Isolated (07/31/2021)  Stress: No Stress Concern Present (07/31/2021)  Tobacco Use: High Risk (06/19/2023)     Readmission Risk Interventions     No data to display

## 2023-06-22 NOTE — Progress Notes (Signed)
Physical Therapy Treatment Patient Details Name: Daniel Stuart MRN: [id number] DOB: [birth year] Today's Date: 06/22/2023   History of Present Illness Patient is 57 y.o. male who was admitted 8/18 from his rehab facility for loss of vision. Found +  ICH. PMH significant of hyperlipidemia, PVD, pacemaker, left carotid occlusion, tobacco abuse and cryptogenic stroke. CT revealed a 30 cc right parieto-occipital ICH with some IVH. Pt is resident at [facility] and at baseline has some ambulation difficulty reliant RW and some speech difficulty.    PT Comments  Reports some notable improvement in vision today. He was able to identify number of fingers held up on Lt and forward but inaccurate in Rt visual field. Max assist to rise to EOB +2 for LE and trunk support. Notable tone and spasticity of LLE with hip and knee contractures present Rt>Lt. Initiated transfer training with sit to stand and lateral squat and scoot along bed with Max A. Shows good effort pushing through LEs, and using UEs as able when cued. Patient will continue to benefit from skilled physical therapy services to further improve independence with functional mobility.     If plan is discharge home, recommend the following: Two people to help with walking and/or transfers;Two people to help with bathing/dressing/bathroom;Assistance with feeding;Direct supervision/assist for medications management;Assist for transportation;Help with stairs or ramp for entrance;Supervision due to cognitive status;Assistance with cooking/housework   Can travel by private vehicle     No  Equipment Recommendations   (defer to facility)    Recommendations for Other Services       Precautions / Restrictions Precautions Precautions: Fall Precaution Comments: SBP 130-150 Restrictions Weight Bearing Restrictions: No     Mobility  Bed Mobility Overal bed mobility: Needs Assistance Bed Mobility: Rolling, Sidelying to Sit, Sit to Sidelying Rolling: Used  rails, Mod assist Sidelying to sit: Max assist, +2 for physical assistance     Sit to sidelying: Max assist, +2 for physical assistance General bed mobility comments: Mod assist to roll onto Lt side, vc and guidance of UE placement on rail, pt able to grasp and pull, with assist for LEs and trunk at Mod A+2 to rise to EOB. Mod A+2 again for trunk and LEs into bed again    Transfers Overall transfer level: Needs assistance Equipment used: 2 person hand held assist Transfers: Sit to/from Stand Sit to Stand: Max assist, +2 physical assistance           General transfer comment: Performed 1/2 stand from bed with pt demonstrating good effort to rise and push through LEs, max A +2 with hand held support bil. Progressed with lateral scoots along bed,  +2 for safety Max A physically with 1 person and knee block bil. Needs assist for LE alignment due to contracture and increased tone/spasticity with efforts.    Ambulation/Gait                   Stairs             Wheelchair Mobility     Tilt Bed    Modified Rankin (Stroke Patients Only) Modified Rankin (Stroke Patients Only) Pre-Morbid Rankin Score: Severe disability Modified Rankin: Severe disability     Balance Overall balance assessment: Needs assistance Sitting-balance support: Single extremity supported, Feet supported Sitting balance-Leahy Scale: Poor Sitting balance - Comments: CGA Postural control: Left lateral lean Standing balance support: Bilateral upper extremity supported Standing balance-Leahy Scale: Zero  Cognition Arousal: Alert Behavior During Therapy: Flat affect Overall Cognitive Status: No family/caregiver present to determine baseline cognitive functioning Area of Impairment: Attention, Memory, Following commands, Safety/judgement, Awareness, Problem solving                   Current Attention Level: Sustained Memory: Decreased short-term  memory Following Commands: Follows one step commands with increased time, Follows one step commands consistently Safety/Judgement: Decreased awareness of deficits Awareness: Intellectual Problem Solving: Slow processing, Difficulty sequencing, Requires verbal cues          Exercises General Exercises - Lower Extremity Quad Sets: Strengthening, Both, 10 reps, Supine Long Arc Quad: Strengthening, Both, 10 reps, Seated, AAROM Hip ABduction/ADduction: Strengthening, Both, 10 reps, Supine    General Comments General comments (skin integrity, edema, etc.): Reports some improvement in vision. Pt able to identify number of fingers in forward and left fields. Able to see but inaccurately counts # of fingers on Rt side of vision.      Pertinent Vitals/Pain Pain Assessment Pain Assessment: Faces Faces Pain Scale: Hurts little more Pain Location: B LEs (at various times) Pain Descriptors / Indicators: Discomfort, Grimacing, Guarding Pain Intervention(s): Monitored during session, Repositioned    Home Living                          Prior Function            PT Goals (current goals can now be found in the care plan section) Acute Rehab PT Goals Patient Stated Goal: return to [facility] PT Goal Formulation: With patient Time For Goal Achievement: 07/04/23 Potential to Achieve Goals: Fair Progress towards PT goals: Progressing toward goals    Frequency    Min 1X/week      PT Plan      Co-evaluation              AM-PAC PT "6 Clicks" Mobility   Outcome Measure  Help needed turning from your back to your side while in a flat bed without using bedrails?: Total Help needed moving from lying on your back to sitting on the side of a flat bed without using bedrails?: Total Help needed moving to and from a bed to a chair (including a wheelchair)?: Total Help needed standing up from a chair using your arms (e.g., wheelchair or bedside chair)?: Total Help needed to  walk in hospital room?: Total Help needed climbing 3-5 steps with a railing? : Total 6 Click Score: 6    End of Session Equipment Utilized During Treatment: Gait belt Activity Tolerance: Patient tolerated treatment well Patient left: in bed;with call bell/phone within reach;with bed alarm set   PT Visit Diagnosis: Unsteadiness on feet (R26.81);Other abnormalities of gait and mobility (R26.89);Muscle weakness (generalized) (M62.81);Difficulty in walking, not elsewhere classified (R26.2);Other symptoms and signs involving the nervous system (R29.898);Other (comment) (vision changes)     Time: 7829-5621 PT Time Calculation (min) (ACUTE ONLY): 20 min  Charges:    $Therapeutic Activity: 8-22 mins PT General Charges $$ ACUTE PT VISIT: 1 Visit                     Kathlyn Sacramento, PT, DPT [facility]  Rehabilitation Services Physical Therapist Office: [phone] Website: [email or web address]    Berton Mount 06/22/2023, 1:26 PM

## 2023-06-22 NOTE — Care Management Important Message (Signed)
Important Message  Patient Details  Name: Daniel Stuart MRN: [id number] Date of Birth: July 26, 1966   Medicare Important Message Given:  Yes     Dorena Bodo 06/22/2023, 2:04 PM

## 2023-06-22 NOTE — Discharge Instructions (Signed)

## 2023-06-22 NOTE — TOC Initial Note (Addendum)
Transition of Care [facility]) - Initial/Assessment Note    Patient Details  Name: Daniel Stuart MRN: [id number] Date of Birth: August 30, 1966  Transition of Care [facility]) CM/SW Contact:    Baldemar Lenis, LCSW Phone Number: 06/22/2023, 11:26 AM  Clinical Narrative:     Patient from [facility] for rehab, patient and sister [name] both in agreement for patient to return at discharge. CSW confirmed with [facility] that bed is still available for patient when medically stable. CSW to follow.              Expected Discharge Plan: Skilled Nursing Facility Barriers to Discharge: Continued Medical Work up   Patient Goals and CMS Choice Patient states their goals for this hospitalization and ongoing recovery are:: Pt and family would like for pt to return to [place].          Expected Discharge Plan and Services     Post Acute Care Choice: Skilled Nursing Facility Living arrangements for the past 2 months: Skilled Nursing Facility Expected Discharge Date: 06/22/23                                    Prior Living Arrangements/Services Living arrangements for the past 2 months: Skilled Nursing Facility Lives with:: Facility Resident Patient language and need for interpreter reviewed:: Yes Do you feel safe going back to the place where you live?: Yes      Need for Family Participation in Patient Care: Yes (Comment) Care giver support system in place?: Yes (comment)   Criminal Activity/Legal Involvement Pertinent to Current Situation/Hospitalization: No - Comment as needed  Activities of Daily Living      Permission Sought/Granted Permission sought to share information with : Family Supports, Oceanographer granted to share information with : Yes, Verbal Permission Granted  Share Information with NAME: [name]  Permission granted to share info w AGENCY: [facility]  Permission granted to share info w Relationship: [name]     Emotional Assessment        Orientation: : Oriented to Self, Oriented to Place, Oriented to  Time, Oriented to Situation Alcohol / Substance Use: Not Applicable Psych Involvement: No (comment)  Admission diagnosis:  ICH (intracerebral hemorrhage) (HCC) [I61.9] Severe comorbid illness [R69] Nontraumatic hemorrhage of right cerebral hemisphere [facility]) [I61.2] Patient Active Problem List   Diagnosis Date Noted   ICH (intracerebral hemorrhage) (HCC) 06/19/2023   Left carotid artery occlusion 09/09/2022   Weakness of both lower extremities 09/07/2022   Impaired mobility 08/25/2022   PAD (peripheral artery disease) (HCC) 08/25/2022   Need for influenza vaccination 08/25/2022   Frequent falls 03/11/2020   Arrhythmia as indication for cardiac pacemaker replacement 03/11/2020   Urinary frequency 03/11/2020   Vitamin D deficiency 09/13/2019   S/P stroke due to cerebrovascular disease 03/12/2019   Right sided weakness 03/12/2019   Anxiety 03/12/2019   Hyperlipidemia    Tobacco abuse 04/12/2017   Dyslipidemia 04/12/2017   PVD (peripheral vascular disease) (HCC) 04/12/2017   Carotid arterial disease (HCC) 04/12/2017   CVA (cerebral vascular accident) (HCC) 04/11/2017   PCP:  Donell Beers, [name] Pharmacy:   [facility] [address] [place] [address] Phone: [phone] Fax: [phone]  [facility] (Now [facility]) - [address] [place], [place] - [address] [place] [address] Phone: [phone] Fax: [phone]  Social Determinants of Health (SDOH) Social History: SDOH Screenings   Food Insecurity: No Food Insecurity (07/31/2021)  Housing: Low Risk  (07/31/2021)  Transportation Needs: No Transportation Needs (07/31/2021)  Alcohol Screen: Low Risk  (07/31/2021)  Depression (PHQ2-9): Low Risk  (04/13/2022)  Financial Resource Strain: Medium Risk (07/31/2021)  Physical  Activity: Inactive (07/31/2021)  Social Connections: Socially Isolated (07/31/2021)  Stress: No Stress Concern Present (07/31/2021)  Tobacco Use: High Risk (06/19/2023)   SDOH Interventions:     Readmission Risk Interventions     No data to display

## 2023-08-02 ENCOUNTER — Ambulatory Visit (INDEPENDENT_AMBULATORY_CARE_PROVIDER_SITE_OTHER): Payer: Medicare Other | Admitting: Neurology

## 2023-08-02 VITALS — BP 119/77 | HR 65

## 2023-08-02 DIAGNOSIS — H547 Unspecified visual loss: Secondary | ICD-10-CM

## 2023-08-02 DIAGNOSIS — I611 Nontraumatic intracerebral hemorrhage in hemisphere, cortical: Secondary | ICD-10-CM

## 2023-08-02 NOTE — Patient Instructions (Addendum)
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  He is clearly at high risk for bleeding in the future and needs to stay off antiplatelet and anticoagulants long-term.  Continue strict control of hypertension with blood pressure goal below 130/90, lipids with LDL cholesterol goal below 70 mg percent and diabetes with hemoglobin A1c goal below 6.5%.  Continue ongoing physical and Occupational Therapy.  Continue Celexa for his depression.  Follow-up in the future only as necessary.  No schedule appointment was made.

## 2023-08-02 NOTE — Progress Notes (Signed)
[facility] [address] [place]. [place] [address] [phone]       OFFICE FOLLOW-UP NOTE  [name] Date of Birth:  August 01, 1966 Medical Record Number:  [id number]   HPI:   Update 08/02/2023 : He returns for follow-up after last visit 5 months ago.  He was referred back following recent admission in August 2024 for intracerebral hemorrhage.  He was admitted on 06/19/2023 from a rehab facility for sudden onset of loss of vision which she noticed upon awakening in the morning.  He could see shapes and lights.  Unable to count fingers and make out details of any objects.  CT scan of the head showed a 30 cubic cc right parieto-occipital parenchymal hemorrhage with some intraventricular extension with no significant hydrocephalus.  Patient was on Eliquis which was reversed with Andexxa.  Blood pressure was tightly controlled.  CT angiogram showed no large vessel stenosis or occlusion on spot sign.  ICH score was 2.  NIH stroke scale was 5.  His neurological exam initially suggested cortical blindness with he was able to detect some motion.  Repeat imaging showed stable appearance of the right parieto-occipital hemorrhage and MRI scan also showed stable appearance but extensive cerebral microhemorrhages and hemosiderin deposits and white matter changes raising concern for cerebral amyloid angiopathy.  2D echo showed ejection fraction of 60 to 65%.  No left ventricular hypertrophy.  LDL cholesterol 46 mg percent.  Hemoglobin A1c was 6.1.  His antiplatelet and Eliquis were discontinued. office visit 03/16/2023 Mr Westervelt is a 57 year african American male seen today for office f/u visit after hospital consult for stroke in November 2023.  History is obtained from the patient and review of electronic medical records and I personally reviewed pertinent available imaging films in PACS.  He has past medical history of hyperlipidemia, peripheral vascular disease, chronic left carotid  occlusion, tobacco abuse, multifocal strokes of cryptogenic 47 2018 status post loop recorder placement.  He was admitted in 09/09/2022 with subacute progressive decline in his gait and cognition with increasing falls.  MRI scan of the brain cervical thoracic and lumbar spine were obtained which showed normal significant abnormalities except mild multifocal punctate acute bilateral cortical and subcortical infarcts.  There is also significant extensive progressive bilateral supratentorial white matter disease and associated atrophy of corpus callosum and cortex along with changes of superficial siderosis raising concerns for territory small vessel disease etiologies like MELAS,CADASIL, CNS vasculitis or other small vessel disease.  CT angiogram showed chronic occlusion of left internal carotid artery at the bulb with reconstitution in the ophthalmic segment no other large vessel disease.  Carotid ultrasound was unremarkable.  Transcranial Doppler study was negative for right-to-left shunt.  Echocardiogram showed ejection fraction of 55 to 60% without cardiac source of embolism.  Patient had a loop recorder inserted following his previous stroke in 2018 and paroxysmal A-fib was not found despite more than 3 years of monitoring.  Hypercoagulable panel labs were significant only for mildly elevated IgM anticardiolipin antibody of 25 and IgM glycoprotein of 47 which were felt to be acute phase reactants.  IgM Lyme antibodies were also detected but patient denies any history of tick bite, rash, having pets with ticks or  living in a wooded area.  There is no family history of strokes at a young age or migraines.  Patient has no prior history of migraines either.  Labs for vasculitis was negative.  Vitamin D levels are slightly low and he was started on  replacement.  Vitamin B12 and TSH were normal.  Hepatitis C antibody was negative.  Vasculitic labs were negative. Patient is currently in skilled nursing facility.  He is  getting daily physical occupational and speech therapy.  He however has not noticed improvement in his gait and balance.  He can get up with support and stand but is unable to walk he feels his legs give out and his balance is poor.  He states his speech is improved but his short-term memory and cognitive abilities remain poor.  He denies feeling depressed but on depression scale he tested positive for depression and mental status exam today scored 15/30. Update 08/02/2023 : Patient is seen for follow-up today after last visit 5 months ago.  He was hospitalized in August 2024 with sudden onset of vision difficulties.  He was found to have a 30 cc acute hematoma in the right parieto-occipital region with small volume intraventricular extension and subarachnoid extension.  CT angiogram showed absent flow in the left ICA which was chronic.  Follow-up CT scan and MRI showed stable appearance of the right parieto-occipital hematoma.  There were extensive cerebral microbleed hemorrhages and hemosiderin deposits noted raising concern for cerebral amyloid angiopathy versus  CADASIL.  Echocardiogram showed ejection fraction of 60 to 65%.  No LVH.  LDL cholesterol 46 mg percent.  Hemoglobin A1c was 6.1.  Patient had been on aspirin and Eliquis prior to admission and these were discontinued.  He was transferred to skilled nursing facility for ongoing therapy needs.  Patient states he is getting physical Occupational Therapy but is still not able to walk even with assistance.Marland Kitchen  He can barely stand with help with the therapist.  He states his memory and cognitive deficits are unchanged.  Still has significant peripheral vision loss which has not improved.  He is not on any antiplatelet therapy or anticoagulation at this time.  There is no family accompanying him today.  In November 2023 patient's neurological exam had noted difficulty counting fingers on the right more than the left with normal size reactive pupils raising  concern for possible cortical blindness.  On today's exam and also patient is inconsistent with visual field testing as he cannot keep his eyes steady but clearly can detect motion and likely sees better in the central field of vision in less towards periphery on either side.  Patient had also tested positive for anticardiolipin.  IgM and phosphatidylserine IgM antibodies in November 2023.  Repeat lab work in January 2024 was also positive for IgM and phosphatidylserine hence was started on Eliquis in July 2024 along with aspirin and Plavix which were subsequently stopped 1 week prior to his admission for intracerebral hemorrhage and at that time he was only on Eliquis.  Patient is currently in a nursing home.  He is not ambulating.  He requires total care.  He states he cannot get up with his 1 person assist with physical therapy but needs to hold onto somebody to take a few steps.  He feels his vision loss has persisted.  ROS:   14 system review of systems is positive for weakness, difficulty walking, imbalance, memory loss and all other systems negative PMH:  Past Medical History:  Diagnosis Date   Anxiety    Arrhythmia as indication for cardiac pacemaker replacement 03/2020   Frequent falls 03/2020   History of loop recorder 04/2017   Stroke (cerebrum) (HCC)    Tobacco use    Urinary frequency 09/2019   Vitamin D deficiency  Social History:  Social History   Socioeconomic History   Marital status: Legally Separated    Spouse name: Not on file   Number of children: 1   Years of education: Not on file   Highest education level: Some college, no degree  Occupational History   Occupation: disabled  Tobacco Use   Smoking status: Every Day    Current packs/day: 1.00    Average packs/day: 1 pack/day for 45.7 years (45.7 ttl pk-yrs)    Types: Cigarettes    Start date: 11/01/1977   Smokeless tobacco: Never  Vaping Use   Vaping status: Never Used  Substance and Sexual Activity    Alcohol use: No   Drug use: No   Sexual activity: Not Currently    Partners: Female  Other Topics Concern   Not on file  Social History Narrative   Patient is right-handed. He lives in a one level home alone  since. He drinks one cup of coffee and 2-3 sodas a day.   Social Determinants of Health   Financial Resource Strain: Medium Risk (07/31/2021)   Overall Financial Resource Strain (CARDIA)    Difficulty of Paying Living Expenses: Somewhat hard  Food Insecurity: No Food Insecurity (07/31/2021)   Hunger Vital Sign    Worried About Running Out of Food in the Last Year: Never true    Ran Out of Food in the Last Year: Never true  Transportation Needs: No Transportation Needs (07/31/2021)   PRAPARE - Administrator, Civil Service (Medical): No    Lack of Transportation (Non-Medical): No  Physical Activity: Inactive (07/31/2021)   Exercise Vital Sign    Days of Exercise per Week: 0 days    Minutes of Exercise per Session: 0 min  Stress: No Stress Concern Present (07/31/2021)   [employer] of Occupational Health - Occupational Stress Questionnaire    Feeling of Stress : Not at all  Social Connections: Socially Isolated (07/31/2021)   Social Connection and Isolation Panel [NHANES]    Frequency of Communication with Friends and Family: Three times a week    Frequency of Social Gatherings with Friends and Family: Never    Attends Religious Services: Never    Database administrator or Organizations: No    Attends Banker Meetings: Never    Marital Status: Separated  Intimate Partner Violence: Not At Risk (07/31/2021)   Humiliation, Afraid, Rape, and Kick questionnaire    Fear of Current or Ex-Partner: No    Emotionally Abused: No    Physically Abused: No    Sexually Abused: No    Medications:   Current Outpatient Medications on File Prior to Visit  Medication Sig Dispense Refill   acetaminophen (TYLENOL) 325 MG tablet Take 2 tablets (650 mg total) by  mouth every 6 (six) hours as needed for mild pain, moderate pain, fever or headache (or temp > 37.5 C (99.5 F)).     baclofen (LIORESAL) 10 MG tablet Take 10 mg by mouth 3 (three) times daily.     cholecalciferol (VITAMIN D3) 25 MCG (1000 UNIT) tablet Take 1,000 Units by mouth daily.     citalopram (CELEXA) 10 MG tablet Take 1 tablet (10 mg total) by mouth daily. 30 tablet 1   oxybutynin (DITROPAN-XL) 10 MG 24 hr tablet Take 10 mg by mouth at bedtime.     pantoprazole (PROTONIX) 40 MG tablet Take 1 tablet (40 mg total) by mouth at bedtime.     senna-docusate (SENOKOT-S) 8.6-50 MG  tablet Take 1 tablet by mouth 2 (two) times daily.     thiamine (VITAMIN B1) 100 MG tablet Take 1 tablet (100 mg total) by mouth daily.     atorvastatin (LIPITOR) 80 MG tablet TAKE 1 TABLET (80 MG TOTAL) BY MOUTH EVERY EVENING. (Patient taking differently: Take 80 mg by mouth daily.) 90 tablet 3   cyanocobalamin 1000 MCG tablet Take 1 tablet (1,000 mcg total) by mouth daily. (Patient not taking: Reported on 08/02/2023)     gabapentin (NEURONTIN) 300 MG capsule TAKE 1 CAPSULE BY MOUTH BY MOUTH TWO TIMES DAILY (Patient taking differently: Take 300 mg by mouth 2 (two) times daily.) 180 capsule 3   nicotine (NICODERM CQ - DOSED IN MG/24 HOURS) 14 mg/24hr patch Place 1 patch (14 mg total) onto the skin daily. (Patient not taking: Reported on 08/02/2023) 28 patch 0   No current facility-administered medications on file prior to visit.    Allergies:  No Known Allergies  Physical Exam General: Frail middle-aged African-American male, seated, in no evident distress Head: head normocephalic and atraumatic.  Neck: supple with no carotid or supraclavicular bruits Cardiovascular: regular rate and rhythm, no murmurs Musculoskeletal: no deformity Skin:  no rash/petichiae Vascular:  Normal pulses all extremities Vitals:   08/02/23 1030  BP: 119/77  Pulse: 65   Neurologic Exam Mental Status: Awake and fully alert. Oriented to  place and time. Recent and remote memory poor attention span, concentration and fund of knowledge diminished. Mood and affect appropriate.  Mini-Mental status exam deferred today but last visit scored 15/30.  Clock drawing 1/4.  Able to name only 1 animals which can walk on 4 legs.  Geriatric depression scale score of 10 suggestive of depression. Cranial Nerves: Fundoscopic exam reveals sharp disc margins. Pupils equal, briskly white matter to light. Extraocular movements full without nystagmus but has slow saccades bilaterally.. Visual fields poor cooperation but suspect diminished peripheral vision bilaterally left more than right and he cannot keep his eyes steady to check peripheral vision and he can detect motion may not identify fingers accurately.Marland Kitchen  Hearing intact. Facial sensation intact.  Mild right lower facial asymmetry when he smiles.  Tongue, palate moves normally and symmetrically.  Brisk jaw jerk. Motor: Spastic right hemiparesis with 4/5 strength on the right with weakness of right hip flexors and ankle dorsiflexors.  Weakness of right grip.  Fine finger movements are diminished on the right.  Orbits left or right upper extremity.  Tone is increased bilaterally but more on the right than the left.   Sensory.: intact to touch ,pinprick .position and vibratory sensation.  Coordination: Rapid alternating movements are slower on the right than the left.   Gait and Station: Deferred as patient is now wheelchair and unable to walk even with assistance..  Reflexes: 3+ and asymmetric and brisker on the right.  No clonus.. Toes downgoing.   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  He has significant underlying vascular dementia and depression and gait difficulties.  Recent admission in August 2024 with right parieto-occipital intracerebral hemorrhage in the setting of severe leukoencephalopathy and antiplatelet and anticoagulant usage.     PLAN:I had a long discussion with the patient regarding his recent intracerebral hemorrhage which was likely a combination of being on antiplatelet agents as well as anticoagulation and having underlying severe white matter changes from his progressive leukoencephalopathy which may be cerebral amyloid angiopathy along with anticardiolipin antibody syndrome.  He is clearly at high risk for bleeding in the future and needs to stay off antiplatelet and anticoagulants long-term.  Continue strict control of hypertension with blood pressure goal below 130/90, lipids with LDL cholesterol goal below 70 mg percent and diabetes with hemoglobin A1c goal below 6.5%.  Continue ongoing physical and Occupational Therapy.  Continue Celexa for his depression.  Follow-up in the future only as necessary.  No schedule appointment was made. Greater than 50% of time during this prolonged 45 minute visit was spent on counseling,explanation of diagnosis, planning of further management, discussion with patient and family and coordination of care Delia Heady, MD Note: This document was prepared with digital dictation and possible smart phrase technology. Any transcriptional errors that result from this process are unintentional

## 2023-08-03 NOTE — Progress Notes (Signed)
Pt was sent a my message.  Kh

## 2024-02-23 ENCOUNTER — Encounter (HOSPITAL_COMMUNITY): Payer: Self-pay

## 2024-02-23 ENCOUNTER — Other Ambulatory Visit: Payer: Self-pay

## 2024-02-23 ENCOUNTER — Emergency Department (HOSPITAL_COMMUNITY)

## 2024-02-23 ENCOUNTER — Inpatient Hospital Stay (HOSPITAL_COMMUNITY)
Admission: EM | Admit: 2024-02-23 | Discharge: 2024-03-09 | DRG: 389 | Disposition: A | Discharge status: Skilled Nursing Facility | Attending: Internal Medicine | Admitting: Internal Medicine

## 2024-02-23 DIAGNOSIS — R140 Abdominal distension (gaseous): Secondary | ICD-10-CM | POA: Diagnosis not present

## 2024-02-23 DIAGNOSIS — K5981 Ogilvie syndrome: Secondary | ICD-10-CM | POA: Diagnosis present

## 2024-02-23 DIAGNOSIS — H47611 Cortical blindness, right side of brain: Secondary | ICD-10-CM | POA: Diagnosis present

## 2024-02-23 DIAGNOSIS — R1084 Generalized abdominal pain: Secondary | ICD-10-CM

## 2024-02-23 DIAGNOSIS — E870 Hyperosmolality and hypernatremia: Secondary | ICD-10-CM | POA: Diagnosis not present

## 2024-02-23 DIAGNOSIS — N179 Acute kidney failure, unspecified: Secondary | ICD-10-CM

## 2024-02-23 DIAGNOSIS — I6522 Occlusion and stenosis of left carotid artery: Secondary | ICD-10-CM | POA: Diagnosis present

## 2024-02-23 DIAGNOSIS — K567 Ileus, unspecified: Principal | ICD-10-CM | POA: Diagnosis present

## 2024-02-23 DIAGNOSIS — Z8673 Personal history of transient ischemic attack (TIA), and cerebral infarction without residual deficits: Secondary | ICD-10-CM | POA: Diagnosis not present

## 2024-02-23 DIAGNOSIS — R600 Localized edema: Secondary | ICD-10-CM | POA: Diagnosis not present

## 2024-02-23 DIAGNOSIS — I10 Essential (primary) hypertension: Secondary | ICD-10-CM

## 2024-02-23 DIAGNOSIS — Z6821 Body mass index (BMI) 21.0-21.9, adult: Secondary | ICD-10-CM

## 2024-02-23 DIAGNOSIS — N182 Chronic kidney disease, stage 2 (mild): Secondary | ICD-10-CM | POA: Diagnosis present

## 2024-02-23 DIAGNOSIS — M7989 Other specified soft tissue disorders: Secondary | ICD-10-CM | POA: Diagnosis not present

## 2024-02-23 DIAGNOSIS — L89322 Pressure ulcer of left buttock, stage 2: Secondary | ICD-10-CM | POA: Diagnosis present

## 2024-02-23 DIAGNOSIS — E860 Dehydration: Secondary | ICD-10-CM | POA: Diagnosis present

## 2024-02-23 DIAGNOSIS — R7989 Other specified abnormal findings of blood chemistry: Secondary | ICD-10-CM | POA: Diagnosis present

## 2024-02-23 DIAGNOSIS — F17210 Nicotine dependence, cigarettes, uncomplicated: Secondary | ICD-10-CM | POA: Diagnosis present

## 2024-02-23 DIAGNOSIS — R933 Abnormal findings on diagnostic imaging of other parts of digestive tract: Secondary | ICD-10-CM

## 2024-02-23 DIAGNOSIS — Z8679 Personal history of other diseases of the circulatory system: Secondary | ICD-10-CM

## 2024-02-23 DIAGNOSIS — Z79899 Other long term (current) drug therapy: Secondary | ICD-10-CM

## 2024-02-23 DIAGNOSIS — I69151 Hemiplegia and hemiparesis following nontraumatic intracerebral hemorrhage affecting right dominant side: Secondary | ICD-10-CM

## 2024-02-23 DIAGNOSIS — G9349 Other encephalopathy: Secondary | ICD-10-CM | POA: Diagnosis present

## 2024-02-23 DIAGNOSIS — Z809 Family history of malignant neoplasm, unspecified: Secondary | ICD-10-CM

## 2024-02-23 DIAGNOSIS — E1165 Type 2 diabetes mellitus with hyperglycemia: Secondary | ICD-10-CM | POA: Diagnosis present

## 2024-02-23 DIAGNOSIS — I129 Hypertensive chronic kidney disease with stage 1 through stage 4 chronic kidney disease, or unspecified chronic kidney disease: Secondary | ICD-10-CM | POA: Diagnosis present

## 2024-02-23 DIAGNOSIS — E46 Unspecified protein-calorie malnutrition: Secondary | ICD-10-CM | POA: Diagnosis present

## 2024-02-23 DIAGNOSIS — E7849 Other hyperlipidemia: Secondary | ICD-10-CM | POA: Diagnosis not present

## 2024-02-23 DIAGNOSIS — Z95818 Presence of other cardiac implants and grafts: Secondary | ICD-10-CM

## 2024-02-23 DIAGNOSIS — E1151 Type 2 diabetes mellitus with diabetic peripheral angiopathy without gangrene: Secondary | ICD-10-CM | POA: Diagnosis present

## 2024-02-23 DIAGNOSIS — H47612 Cortical blindness, left side of brain: Secondary | ICD-10-CM | POA: Diagnosis present

## 2024-02-23 DIAGNOSIS — I700 Atherosclerosis of aorta: Secondary | ICD-10-CM | POA: Diagnosis present

## 2024-02-23 DIAGNOSIS — E785 Hyperlipidemia, unspecified: Secondary | ICD-10-CM | POA: Diagnosis present

## 2024-02-23 DIAGNOSIS — K529 Noninfective gastroenteritis and colitis, unspecified: Secondary | ICD-10-CM | POA: Diagnosis not present

## 2024-02-23 DIAGNOSIS — E876 Hypokalemia: Secondary | ICD-10-CM | POA: Diagnosis present

## 2024-02-23 DIAGNOSIS — E1122 Type 2 diabetes mellitus with diabetic chronic kidney disease: Secondary | ICD-10-CM | POA: Diagnosis present

## 2024-02-23 DIAGNOSIS — D6861 Antiphospholipid syndrome: Secondary | ICD-10-CM | POA: Diagnosis present

## 2024-02-23 DIAGNOSIS — L89512 Pressure ulcer of right ankle, stage 2: Secondary | ICD-10-CM | POA: Diagnosis present

## 2024-02-23 DIAGNOSIS — Z833 Family history of diabetes mellitus: Secondary | ICD-10-CM

## 2024-02-23 DIAGNOSIS — Z419 Encounter for procedure for purposes other than remedying health state, unspecified: Secondary | ICD-10-CM

## 2024-02-23 DIAGNOSIS — Z825 Family history of asthma and other chronic lower respiratory diseases: Secondary | ICD-10-CM

## 2024-02-23 DIAGNOSIS — E1140 Type 2 diabetes mellitus with diabetic neuropathy, unspecified: Secondary | ICD-10-CM | POA: Diagnosis present

## 2024-02-23 DIAGNOSIS — Z8249 Family history of ischemic heart disease and other diseases of the circulatory system: Secondary | ICD-10-CM

## 2024-02-23 DIAGNOSIS — L8990 Pressure ulcer of unspecified site, unspecified stage: Secondary | ICD-10-CM

## 2024-02-23 DIAGNOSIS — Z7401 Bed confinement status: Secondary | ICD-10-CM | POA: Diagnosis not present

## 2024-02-23 LAB — COMPREHENSIVE METABOLIC PANEL WITH GFR
ALT: 13 U/L (ref 0–44)
AST: 15 U/L (ref 15–41)
Albumin: 3.4 g/dL — ABNORMAL LOW (ref 3.5–5.0)
Alkaline Phosphatase: 88 U/L (ref 38–126)
Anion gap: 13 (ref 5–15)
BUN: 9 mg/dL (ref 6–20)
CO2: 24 mmol/L (ref 22–32)
Calcium: 8.8 mg/dL — ABNORMAL LOW (ref 8.9–10.3)
Chloride: 102 mmol/L (ref 98–111)
Creatinine, Ser: 1.32 mg/dL — ABNORMAL HIGH (ref 0.61–1.24)
GFR, Estimated: >60 mL/min (ref >60)
Glucose, Bld: 98 mg/dL (ref 70–99)
Potassium: 3 mmol/L — ABNORMAL LOW (ref 3.5–5.1)
Sodium: 139 mmol/L (ref 135–145)
Total Bilirubin: 1 mg/dL (ref 0.0–1.2)
Total Protein: 7.6 g/dL (ref 6.5–8.1)

## 2024-02-23 LAB — CBC
HCT: 43.6 % (ref 39.0–52.0)
Hemoglobin: 14.7 g/dL (ref 13.0–17.0)
MCH: 28.7 pg (ref 26.0–34.0)
MCHC: 33.7 g/dL (ref 30.0–36.0)
MCV: 85.2 fL (ref 80.0–100.0)
Platelets: 219 10*3/uL (ref 150–400)
RBC: 5.12 MIL/uL (ref 4.22–5.81)
RDW: 13.5 % (ref 11.5–15.5)
WBC: 15.9 10*3/uL — ABNORMAL HIGH (ref 4.0–10.5)
nRBC: 0 % (ref 0.0–0.2)

## 2024-02-23 MED ORDER — ACETAMINOPHEN 650 MG RE SUPP: 650.0000 mg | Freq: Four times a day (QID) | RECTAL | Status: DC | PRN

## 2024-02-23 MED ORDER — SODIUM CHLORIDE 0.9% FLUSH
3.0000 mL | INTRAVENOUS | Status: DC | PRN
  Administered 2024-02-29: 3 mL via INTRAVENOUS

## 2024-02-23 MED ORDER — ONDANSETRON HCL 4 MG PO TABS: 4.0000 mg | ORAL_TABLET | Freq: Four times a day (QID) | ORAL | Status: DC | PRN

## 2024-02-23 MED ORDER — POTASSIUM CHLORIDE 10 MEQ/100ML IV SOLN: 10.0000 meq | INTRAVENOUS | Status: AC

## 2024-02-23 MED ORDER — SODIUM CHLORIDE 0.9% FLUSH
3.0000 mL | Freq: Two times a day (BID) | INTRAVENOUS | Status: DC
  Administered 2024-02-23 – 2024-03-09 (×28): 3 mL via INTRAVENOUS

## 2024-02-23 MED ORDER — LACTATED RINGERS IV SOLN: INTRAVENOUS | Status: AC

## 2024-02-23 MED ORDER — ONDANSETRON HCL 4 MG/2ML IJ SOLN: 4.0000 mg | Freq: Four times a day (QID) | INTRAMUSCULAR | Status: DC | PRN

## 2024-02-23 MED ORDER — PIPERACILLIN-TAZOBACTAM 3.375 G IVPB
3.3750 g | Freq: Three times a day (TID) | INTRAVENOUS | Status: DC
  Administered 2024-02-24: 3.375 g via INTRAVENOUS
  Filled 2024-02-23: qty 50

## 2024-02-23 MED ORDER — ACETAMINOPHEN 325 MG PO TABS
650.0000 mg | ORAL_TABLET | Freq: Four times a day (QID) | ORAL | Status: DC | PRN
  Administered 2024-02-26 – 2024-03-06 (×6): 650 mg via ORAL
  Filled 2024-02-23 (×6): qty 2

## 2024-02-23 MED ORDER — GABAPENTIN 300 MG PO CAPS
300.0000 mg | ORAL_CAPSULE | Freq: Two times a day (BID) | ORAL | Status: DC
  Administered 2024-02-23 – 2024-03-09 (×30): 300 mg via ORAL
  Filled 2024-02-23 (×30): qty 1

## 2024-02-23 MED ORDER — PIPERACILLIN-TAZOBACTAM 3.375 G IVPB 30 MIN
3.3750 g | Freq: Once | INTRAVENOUS | Status: AC
  Administered 2024-02-23: 3.375 g via INTRAVENOUS
  Filled 2024-02-23: qty 50

## 2024-02-23 MED ORDER — LACTATED RINGERS IV BOLUS
500.0000 mL | Freq: Once | INTRAVENOUS | Status: AC
  Administered 2024-02-23: 500 mL via INTRAVENOUS

## 2024-02-23 MED ORDER — PROCHLORPERAZINE EDISYLATE 10 MG/2ML IJ SOLN: 10.0000 mg | Freq: Four times a day (QID) | INTRAMUSCULAR | Status: DC | PRN

## 2024-02-23 MED ORDER — SODIUM CHLORIDE 0.9 % IV SOLN: 250.0000 mL | INTRAVENOUS | Status: AC | PRN

## 2024-02-23 MED ORDER — BACLOFEN 10 MG PO TABS
10.0000 mg | ORAL_TABLET | Freq: Three times a day (TID) | ORAL | Status: DC
  Administered 2024-02-23 – 2024-02-29 (×19): 10 mg via ORAL
  Filled 2024-02-23 (×19): qty 1

## 2024-02-23 MED ORDER — IOHEXOL 350 MG/ML SOLN
75.0000 mL | Freq: Once | INTRAVENOUS | Status: AC | PRN
  Administered 2024-02-23: 75 mL via INTRAVENOUS

## 2024-02-23 NOTE — H&P (Signed)
 History and Physical    [name]:[id number] DOB: May 04, 1966 DOA: 02/23/2024  PCP: Paseda, Folashade R, [name]   Patient coming from: ALF   Chief Complaint:  Chief Complaint  Patient presents with   Abdominal Pain   Constipation   ED TRIAGE note:  Pt from [facility] with [facility] c.o abd distention, last BM 3 days ago. Pt has not eaten in 3 days. Facility concerned for bowel obstruction. Pt bedbound. VSS      HPI:  Daniel Stuart is a 58 y.o. 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  Reported patient did not have any bowel movement for 3 days and not eating well.  Denies any nausea and vomiting.  Patient stating that he has tried laxative without any improvement.  At the baseline patient is bedbound nonambulatory due to prior neurological deficit. Denies any fever chills, nausea, vomiting, and diarrhea.   ED Course:  At presentation to ED patient is hemodynamically stable CBC showing leukocytosis 15.9.  Normal RBC, hemoglobin and platelet count. CMP showing low potassium 3, elevated creatinine 1.3, low albumin 3.4.  Pending lactic acid level.  CT abdomen pelvis: 1. Diffuse dilatation of the colon with air-fluid levels worrisome for colonic ileus the. Mild wall thickening of the transverse compatible with colitis cannot exclude pneumatosis in the ascending colon. Correlate clinically for ischemia. 2. No free intraperitoneal air. 3. Nonobstructing bilateral renal calculi. 4. Patchy ground-glass opacities in the  bilateral lower lobes worrisome for infection or inflammation. 5. Aortic atherosclerosis.  Dr. Guss Legacy last consulted and spoke with general surgery Dr. Aldean Hummingbird.  Per general surgery in the setting of ileus recommended conservative management, trend lactic acid and there is no need for surgery to involved in this case. However if something emergently arises need to inform surgery to assess the patient.   Due to colitis in the ED patient has been started on Zosyn .  Due to hypokalemia repleted with IV KCl 40 mEq.  Lactic acid has been ordered.  Patient also received 500 mL of LR bolus.   Hospitalist has been consulted for further evaluation and management of ileus, colitis and AKI.  Significant labs in the ED: Lab Orders         Culture, blood (Routine X 2) w Reflex to ID Panel         C Difficile Quick Screen w PCR reflex         CBC         Comprehensive metabolic panel         Urinalysis, Routine w reflex microscopic -Urine, Clean Catch         HIV Antibody (routine testing w rflx)         Comprehensive metabolic panel         CBC         Lactic acid, plasma         Creatinine, urine, random         Sodium, urine, random         I-Stat Lactic Acid       Review of Systems:  Review of Systems  Constitutional:  Negative for chills and fever.  Cardiovascular:  Negative for chest pain.  Gastrointestinal:  Positive for abdominal pain and constipation. Negative for blood in stool, melena, nausea and vomiting.  Psychiatric/Behavioral:  The patient is not nervous/anxious.     Past Medical History:  Diagnosis Date   Anxiety    Arrhythmia as indication for cardiac pacemaker replacement 03/2020   Frequent falls 03/2020   History of loop recorder 04/2017   Stroke (cerebrum) (HCC)    Tobacco use    Urinary frequency 09/2019   Vitamin D  deficiency     Past Surgical History:  Procedure Laterality Date   FOOT SURGERY     LOOP RECORDER INSERTION N/A 04/13/2017   Procedure: Loop  Recorder Insertion;  Surgeon: Tammie Fall, MD;  Location: [facility] INVASIVE CV LAB;  Service: Cardiovascular;  Laterality: N/A;   TEE WITHOUT CARDIOVERSION N/A 04/13/2017   Procedure: TRANSESOPHAGEAL ECHOCARDIOGRAM (TEE);  Surgeon: Hugh Madura, MD;  Location: [facility] ENDOSCOPY;  Service: Cardiovascular;  Laterality: N/A;     reports that he has been smoking cigarettes. He started smoking about 46 years ago. He has a 46.3 pack-year smoking history. He has never used smokeless tobacco. He reports that he does not drink alcohol and does not use drugs.  No Known Allergies  Family History  Problem Relation Age of Onset   Diabetes Mother    COPD Mother    Diabetes Father    Heart failure Father    Cancer Sister     Prior to Admission medications   Medication Sig Start Date End Date Taking? Authorizing Provider  acetaminophen  (TYLENOL ) 325 MG tablet Take 2 tablets (650 mg total) by mouth every 6 (six) hours as needed for mild pain, moderate pain, fever or headache (or temp > 37.5 C (99.5 F)). 06/22/23  Yes Hongalgi, Anand D, MD  atorvastatin  (LIPITOR ) 80 MG tablet TAKE 1 TABLET (80 MG TOTAL) BY MOUTH EVERY EVENING. Patient taking differently: Take 80 mg by mouth daily. 02/22/22 02/23/24 Yes Passmore, Ramona Burner I, NP  baclofen  (LIORESAL ) 10 MG tablet Take 10 mg by mouth 3 (three) times daily. 05/25/23  Yes [provider]  cyanocobalamin  1000 MCG tablet Take 1 tablet (1,000 mcg total) by mouth daily. 09/11/22  Yes Gwendalyn Lemma, MD  gabapentin  (NEURONTIN ) 300 MG capsule TAKE 1 CAPSULE BY MOUTH BY MOUTH TWO TIMES DAILY Patient taking differently: Take 300 mg by mouth 2 (two) times daily. 04/22/22 02/23/24 Yes Jaffe, Adam R, DO  pantoprazole  (PROTONIX ) 40 MG tablet Take 1 tablet (40 mg total) by mouth at bedtime. 06/22/23  Yes Hongalgi, Anand D, MD  senna-docusate (SENOKOT-S) 8.6-50 MG tablet Take 1 tablet by mouth 2 (two) times daily. 06/22/23  Yes Hongalgi, Anand D, MD  cholecalciferol (VITAMIN D3)  25 MCG (1000 UNIT) tablet Take 1,000 Units by mouth daily. Patient not taking: Reported on 02/23/2024    [provider]  citalopram  (CELEXA ) 10 MG tablet Take 1 tablet (10 mg total) by mouth daily. Patient not taking: Reported on 02/23/2024 11/08/22   Sethi, Pramod S, MD  nicotine  (NICODERM CQ  - DOSED IN MG/24 HOURS) 14 mg/24hr patch Place 1 patch (14 mg total) onto the skin daily. Patient not taking: Reported on 08/02/2023 06/23/23   Hongalgi, Anand D, MD  oxybutynin  (DITROPAN -XL) 10 MG 24 hr tablet Take 10 mg by mouth at bedtime. Patient not taking: Reported on 02/23/2024    [provider]  thiamine  (VITAMIN B1) 100 MG tablet Take 1 tablet (100 mg total) by mouth daily. Patient not taking: Reported on 02/23/2024 09/10/22   Memon,  Hermenia Loose, MD     Physical Exam: Vitals:   02/23/24 2145 02/23/24 2200 02/23/24 2215 02/23/24 2230  BP: 115/86 119/83 116/84 111/84  Pulse: (!) 101 95 99 (!) 103  Resp: 14 13 13 14   Temp:      TempSrc:      SpO2: 92% 94% (!) 89% 91%  Weight:      Height:        Physical Exam Vitals and nursing note reviewed.  Constitutional:      Appearance: He is ill-appearing.  Cardiovascular:     Rate and Rhythm: Normal rate and regular rhythm.  Abdominal:     General: Abdomen is protuberant. Bowel sounds are decreased. There is distension. There is no abdominal bruit.     Palpations: Abdomen is soft.     Tenderness: There is generalized abdominal tenderness. There is no guarding or rebound.     Hernia: There is no hernia in the umbilical area or ventral area.  Skin:    General: Skin is dry.     Capillary Refill: Capillary refill takes less than 2 seconds.  Neurological:     Mental Status: He is alert and oriented to person, place, and time.      Labs on Admission: I have personally reviewed following labs and imaging studies  CBC: Recent Labs  Lab 02/23/24 1419  WBC 15.9*  HGB 14.7  HCT 43.6  MCV 85.2  PLT 219   Basic Metabolic  Panel: Recent Labs  Lab 02/23/24 1419  NA 139  K 3.0*  CL 102  CO2 24  GLUCOSE 98  BUN 9  CREATININE 1.32*  CALCIUM  8.8*   GFR: Estimated Creatinine Clearance: 51.1 mL/min (A) (by C-G formula based on SCr of 1.32 mg/dL (H)). Liver Function Tests: Recent Labs  Lab 02/23/24 1419  AST 15  ALT 13  ALKPHOS 88  BILITOT 1.0  PROT 7.6  ALBUMIN 3.4*   No results for input(s): "LIPASE", "AMYLASE" in the last 168 hours. No results for input(s): "AMMONIA" in the last 168 hours. Coagulation Profile: No results for input(s): "INR", "PROTIME" in the last 168 hours. Cardiac Enzymes: No results for input(s): "CKTOTAL", "CKMB", "CKMBINDEX", "TROPONINI", "TROPONINIHS" in the last 168 hours. BNP (last 3 results) No results for input(s): "BNP" in the last 8760 hours. HbA1C: No results for input(s): "HGBA1C" in the last 72 hours. CBG: No results for input(s): "GLUCAP" in the last 168 hours. Lipid Profile: No results for input(s): "CHOL", "HDL", "LDLCALC", "TRIG", "CHOLHDL", "LDLDIRECT" in the last 72 hours. Thyroid  Function Tests: No results for input(s): "TSH", "T4TOTAL", "FREET4", "T3FREE", "THYROIDAB" in the last 72 hours. Anemia Panel: No results for input(s): "VITAMINB12", "FOLATE", "FERRITIN", "TIBC", "IRON", "RETICCTPCT" in the last 72 hours. Urine analysis:    Component Value Date/Time   COLORURINE STRAW (A) 06/19/2023 2259   APPEARANCEUR CLEAR 06/19/2023 2259   LABSPEC 1.023 06/19/2023 2259   PHURINE 7.0 06/19/2023 2259   GLUCOSEU NEGATIVE 06/19/2023 2259   HGBUR NEGATIVE 06/19/2023 2259   BILIRUBINUR NEGATIVE 06/19/2023 2259   BILIRUBINUR negative 07/10/2021 1202   BILIRUBINUR neg 06/11/2020 0901   KETONESUR NEGATIVE 06/19/2023 2259   PROTEINUR NEGATIVE 06/19/2023 2259   UROBILINOGEN 0.2 07/10/2021 1202   UROBILINOGEN 0.2 11/30/2017 1030   NITRITE NEGATIVE 06/19/2023 2259   LEUKOCYTESUR NEGATIVE 06/19/2023 2259    Radiological Exams on Admission: I have personally  reviewed images CT ABDOMEN PELVIS W CONTRAST Result Date: 02/23/2024 CLINICAL DATA:  Acute abdominal pain. EXAM: CT ABDOMEN AND PELVIS WITH  CONTRAST TECHNIQUE: Multidetector CT imaging of the abdomen and pelvis was performed using the standard protocol following bolus administration of intravenous contrast. RADIATION DOSE REDUCTION: This exam was performed according to the departmental dose-optimization program which includes automated exposure control, adjustment of the mA and/or kV according to patient size and/or use of iterative reconstruction technique. CONTRAST:  75mL OMNIPAQUE  IOHEXOL  350 MG/ML SOLN COMPARISON:  None Available. FINDINGS: Lower chest: There are patchy ground-glass opacities in the bilateral lower lobes. Hepatobiliary: No focal liver abnormality is seen. No gallstones, gallbladder wall thickening, or biliary dilatation. Pancreas: Unremarkable. No pancreatic ductal dilatation or surrounding inflammatory changes. Spleen: Normal in size without focal abnormality. Adrenals/Urinary Tract: There a are punctate bilateral renal calculi measuring up to 4 mm. Otherwise, the kidneys, adrenal glands and bladder are within normal limits. Stomach/Bowel: The colon is diffusely dilated and contains air-fluid levels. There is some mild circumferential wall thickening of the anterior and peripheral bubbly lucencies are seen in the ascending colon which may be related to fecal content; however, pneumatosis would be difficult to exclude. There is no free intraperitoneal air. The appendix, small bowel, and stomach are within normal limits. Vascular/Lymphatic: Aortic atherosclerosis. No enlarged abdominal or pelvic lymph nodes. Reproductive: Prostate is unremarkable. Other: There is a small fat containing umbilical hernia. Musculoskeletal: No acute or significant osseous findings.  The IMPRESSION: 1. Diffuse dilatation of the colon with air-fluid levels worrisome for colonic ileus the. Mild wall thickening of the  transverse compatible with colitis cannot exclude pneumatosis in the ascending colon. Correlate clinically for ischemia. 2. No free intraperitoneal air. 3. Nonobstructing bilateral renal calculi. 4. Patchy ground-glass opacities in the bilateral lower lobes worrisome for infection or inflammation. 5. Aortic atherosclerosis. Aortic Atherosclerosis (ICD10-I70.0). Electronically Signed   By: Tyron Gallon M.D.   On: 02/23/2024 20:02     Principal Problem:   Ileus (HCC) Active Problems:   Colitis   Hypokalemia   AKI (acute kidney injury) (HCC)   History of intracranial hemorrhage   Hyperlipidemia   History of CVA (cerebrovascular accident)   Essential hypertension   Antiphospholipid syndrome (HCC)    Assessment and Plan: Ileus Colitis -Patient has been presented to emergency department from nursing home with complaining of no bowel movement for 3 days, abdominal distention and generalized abdominal pain.  Patient denies any nausea, vomiting, bloody bowel movement and diarrhea. - At presentation to ED patient is hemodynamically stable - CBC showing leukocytosis 15.9 - CMP showing evidence of AKI and hypokalemia. - CT abdomen pelvis showed diffuse  dilatation of the colon with air-fluid levels worrisome for colonic ileus the. Mild wall thickening of the transverse compatible with colitis cannot exclude pneumatosis in the ascending colon. Correlate clinically for ischemia.No free intraperitoneal air.  -ED physician discussed case with general surgery Dr. Elvan Hamel.  Per general surgery it is a nonsurgical case, in the setting of ileus recommended conservative management and check lactic acid level. If any indication/emergency arises can inform general surgery for formal consult. -Given patient is having colitis in the ED started on IV Zosyn . - Pending lactic acid level. - Checking blood culture and C. difficile panel - Replating with IV KCl goal to keep potassium above 4. - Keeping patient  NPO.  Continue bowel rest and aggressive hydration with LR 150 cc/h. -If patient develops worsening abdominal pain in that case need to obtain stat CT abdomen.  Or need to obtain CT abdomen in 12 to 24 hours if no clinical improvement. -All oral medications on hold except  continue baclofen  and gabapentin . -Continue serial abdominal exam. Addendum - Requested to check lactic acid while patient in the ED almost 3 hours ago however it has not been drawn yet.  Requested floor nurse called the lab and check the lactic acid level immediately.   Hypokalemia -Low potassium level 3.  Repleted with IV KCl 40 mEq in the ED.  Acute kidney injury on CKD stage II -Elevated creatinine 1.32.  Prerenal acute kidney injury in the setting of ileus and poor oral intake and dehydration. - Checking UA, urine creatinine and sodium. -Continue maintenance fluid LR at 150 cc/h.  Continue to monitor renal function.  History of intracranial hemorrhage History of severe encephalopathy History of CVA/TIA/progressive leukoencephalopathy and chronic left ICA stenosis -Patient has history of severe progressive encephalopathy and intracranial hemorrhage.  Previously was on aspirin , Eliquis and Plavix  everything has been discontinued after patient developed intracranial hemorrhage in 2024.  Chronic bedbound status Peripheral neuropathy - Continue baclofen  and gabapentin .   History of cortical blindness -Per chart review patient has proprioception of light bilaterally.  In 06/21/2023 during hospitalization patient has serial imaging and exam which consistent findings of cortical blindness.  History of antiphospholipid syndrome -Due to the history of intracranial bleeding not on any anticoagulation currently.  Essential hypertension -Normotensive.  Holding any blood pressure regimen.  Hyperlipidemia -Lipitor  on hold as patient is n.p.o. in the setting of ileus   DVT prophylaxis:  SCDs.  Deferring pharmacological  DVT prophylaxis given history of intracranial bleeding Code Status:  Full Code Diet: Currently n.p.o. Family Communication: Currently no family member at bedside Disposition Plan: Pending lactic acid level.  Need to repeat CT abdomen in next 12 to 24 hours if no clinical improvement. Consults: General surgery Admission status:   Inpatient, Telemetry bed  Severity of Illness: The appropriate patient status for this patient is INPATIENT. Inpatient status is judged to be reasonable and necessary in order to provide the required intensity of service to ensure the patient's safety. The patient's presenting symptoms, physical exam findings, and initial radiographic and laboratory data in the context of their chronic comorbidities is felt to place them at high risk for further clinical deterioration. Furthermore, it is not anticipated that the patient will be medically stable for discharge from the hospital within 2 midnights of admission.   * I certify that at the point of admission it is my clinical judgment that the patient will require inpatient hospital care spanning beyond 2 midnights from the point of admission due to high intensity of service, high risk for further deterioration and high frequency of surveillance required.Aaron Aas    Lamya Lausch, MD [facility]  How to contact the [facility] Attending or Consulting provider 7A - 7P or covering provider during after hours 7P -7A, for this patient.  Check the care team in [facility] and look for a) attending/consulting [facility] provider listed and b) the [facility] team listed Log into [email or web address] and use [place]'s universal password to access. If you do not have the password, please contact the hospital operator. Locate the [facility] provider you are looking for under [facility] and page to a number that you can be directly reached. If you still have difficulty reaching the provider, please page the [facility] (Director on Call) for the Hospitalists listed on amion  for assistance.  02/23/2024, 10:57 PM

## 2024-02-23 NOTE — ED Notes (Signed)
 Transport called to take PT upstairs.

## 2024-02-23 NOTE — ED Notes (Signed)
 Transport called for PT (2nd)

## 2024-02-23 NOTE — ED Provider Notes (Signed)
 [place] EMERGENCY DEPARTMENT AT [facility] Provider Note   CSN: [id number] Arrival date & time: 02/23/24  1227     History {Add pertinent medical, surgical, social history, OB history to HPI:1} Chief Complaint  Patient presents with   Abdominal Pain   Constipation    Daniel Stuart is a 58 y.o. male.  He has a prior history of stroke/intracranial hemorrhage.  He is currently at [facility] for rehab.  He said he has had a few days of generalized abdominal pain and distention.  Has not moved his bowels in 3 days.  Not really eating.  No nausea or vomiting.  He said they tried to give him a laxative from above and below without any improvement.  He is bedbound nonambulatory at baseline to his prior neurologic deficits.  No fevers or chills.  The history is provided by the patient.  Abdominal Pain Pain location:  Generalized Pain quality: aching   Pain severity:  Moderate Onset quality:  Gradual Duration:  3 days Timing:  Constant Progression:  Worsening Chronicity:  New Relieved by:  Nothing Associated symptoms: constipation   Associated symptoms: no chest pain, no cough, no diarrhea, no dysuria, no fever, no hematemesis, no nausea, no shortness of breath, no sore throat and no vomiting        Home Medications Prior to Admission medications   Medication Sig Start Date End Date Taking? Authorizing Provider  acetaminophen  (TYLENOL ) 325 MG tablet Take 2 tablets (650 mg total) by mouth every 6 (six) hours as needed for mild pain, moderate pain, fever or headache (or temp > 37.5 C (99.5 F)). 06/22/23  Yes Hongalgi, Anand D, MD  atorvastatin  (LIPITOR ) 80 MG tablet TAKE 1 TABLET (80 MG TOTAL) BY MOUTH EVERY EVENING. Patient taking differently: Take 80 mg by mouth daily. 02/22/22 02/23/24 Yes Passmore, Ramona Burner I, NP  baclofen  (LIORESAL ) 10 MG tablet Take 10 mg by mouth 3 (three) times daily. 05/25/23  Yes [provider]  cyanocobalamin  1000 MCG tablet Take 1 tablet  (1,000 mcg total) by mouth daily. 09/11/22  Yes Gwendalyn Lemma, MD  gabapentin  (NEURONTIN ) 300 MG capsule TAKE 1 CAPSULE BY MOUTH BY MOUTH TWO TIMES DAILY Patient taking differently: Take 300 mg by mouth 2 (two) times daily. 04/22/22 02/23/24 Yes Jaffe, Adam R, DO  pantoprazole  (PROTONIX ) 40 MG tablet Take 1 tablet (40 mg total) by mouth at bedtime. 06/22/23  Yes Hongalgi, Anand D, MD  senna-docusate (SENOKOT-S) 8.6-50 MG tablet Take 1 tablet by mouth 2 (two) times daily. 06/22/23  Yes Hongalgi, Anand D, MD  cholecalciferol (VITAMIN D3) 25 MCG (1000 UNIT) tablet Take 1,000 Units by mouth daily. Patient not taking: Reported on 02/23/2024    [provider]  citalopram  (CELEXA ) 10 MG tablet Take 1 tablet (10 mg total) by mouth daily. Patient not taking: Reported on 02/23/2024 11/08/22   Lisabeth Rider, MD  nicotine  (NICODERM CQ  - DOSED IN MG/24 HOURS) 14 mg/24hr patch Place 1 patch (14 mg total) onto the skin daily. Patient not taking: Reported on 08/02/2023 06/23/23   Hongalgi, Anand D, MD  oxybutynin  (DITROPAN -XL) 10 MG 24 hr tablet Take 10 mg by mouth at bedtime. Patient not taking: Reported on 02/23/2024    [provider]  thiamine  (VITAMIN B1) 100 MG tablet Take 1 tablet (100 mg total) by mouth daily. Patient not taking: Reported on 02/23/2024 09/10/22   Gwendalyn Lemma, MD      Allergies    Patient has no known allergies.  Review of Systems   Review of Systems  Constitutional:  Negative for fever.  HENT:  Negative for sore throat.   Respiratory:  Negative for cough and shortness of breath.   Cardiovascular:  Negative for chest pain.  Gastrointestinal:  Positive for abdominal pain and constipation. Negative for diarrhea, hematemesis, nausea, rectal pain and vomiting.  Genitourinary:  Negative for dysuria.  Skin:  Negative for rash.  Neurological:  Positive for weakness.    Physical Exam Updated Vital Signs BP 129/82   Pulse 100   Temp 98.3 F (36.8 C) (Oral)   Resp  20   Ht 5\' 6"  (1.676 m)   Wt 60 kg   SpO2 95%   BMI 21.35 kg/m  Physical Exam Vitals and nursing note reviewed.  Constitutional:      General: He is not in acute distress.    Appearance: He is well-developed.  HENT:     Head: Normocephalic and atraumatic.  Eyes:     Conjunctiva/sclera: Conjunctivae normal.  Cardiovascular:     Rate and Rhythm: Normal rate and regular rhythm.     Heart sounds: No murmur heard. Pulmonary:     Effort: Pulmonary effort is normal. No respiratory distress.     Breath sounds: Normal breath sounds.  Abdominal:     General: Abdomen is protuberant. There is distension.     Palpations: Abdomen is soft.     Tenderness: There is generalized abdominal tenderness.  Musculoskeletal:        General: No swelling.     Cervical back: Neck supple.  Skin:    General: Skin is warm and dry.     Capillary Refill: Capillary refill takes less than 2 seconds.  Neurological:     Mental Status: He is alert.     Comments: He has some use of his upper and lower extremities although it limited.  He is awake and alert.     ED Results / Procedures / Treatments   Labs (all labs ordered are listed, but only abnormal results are displayed) Labs Reviewed  CBC - Abnormal; Notable for the following components:      Result Value   WBC 15.9 (*)    All other components within normal limits  COMPREHENSIVE METABOLIC PANEL WITH GFR - Abnormal; Notable for the following components:   Potassium 3.0 (*)    Creatinine, Ser 1.32 (*)    Calcium  8.8 (*)    Albumin 3.4 (*)    All other components within normal limits  URINALYSIS, ROUTINE W REFLEX MICROSCOPIC    EKG None  Radiology No results found.  Procedures Procedures  {Document cardiac monitor, telemetry assessment procedure when appropriate:1}  Medications Ordered in ED Medications  lactated ringers  bolus 500 mL (500 mLs Intravenous New Bag/Given 02/23/24 1619)    ED Course/ Medical Decision Making/ A&P   {    Click here for ABCD2, HEART and other calculatorsREFRESH Note before signing :1}                              Medical Decision Making  This patient complains of ***; this involves an extensive number of treatment Options and is a complaint that carries with it a high risk of complications and morbidity. The differential includes ***  I ordered, reviewed and interpreted labs, which included *** I ordered medication *** and reviewed PMP when indicated. I ordered imaging studies which included *** and I independently  visualized and interpreted imaging which showed *** Additional history obtained from *** Previous records obtained and reviewed *** I consulted *** and discussed lab and imaging findings and discussed disposition.  Cardiac monitoring reviewed, *** Social determinants considered, *** Critical Interventions: ***  After the interventions stated above, I reevaluated the patient and found *** Admission and further testing considered, ***   {Document critical care time when appropriate:1} {Document review of labs and clinical decision tools ie heart score, Chads2Vasc2 etc:1}  {Document your independent review of radiology images, and any outside records:1} {Document your discussion with family members, caretakers, and with consultants:1} {Document social determinants of health affecting pt's care:1} {Document your decision making why or why not admission, treatments were needed:1} Final Clinical Impression(s) / ED Diagnoses Final diagnoses:  None    Rx / DC Orders ED Discharge Orders     None

## 2024-02-23 NOTE — ED Notes (Signed)
 Patient transported to CT

## 2024-02-23 NOTE — ED Triage Notes (Signed)
 Pt from [facility] with [facility] c.o abd distention, last BM 3 days ago. Pt has not eaten in 3 days. Facility concerned for bowel obstruction. Pt bedbound. VSS

## 2024-02-23 NOTE — ED Provider Triage Note (Signed)
 Emergency Medicine Provider Triage Evaluation Note  PRINCESTON BLIZZARD , a 58 y.o. male  was evaluated in triage.  Here from facility with history of higher intraventricular hemorrhage no longer on anticoagulants pt complains of abdominal pain patient says he has had abdominal pain for 2 to 3 days.  He reports inability to take p.o. due to increased pain and nausea.  He is unable to tell me when he had his last bowel movement.  He denies any urinary tract infection symptoms he denies having any similar symptoms in the past..  Review of Systems  Positive: Abdominal pain nausea Negative: Vomiting  Physical Exam  BP (!) 121/91   Pulse (!) 103   Temp 98.3 F (36.8 C) (Oral)   Resp 20   SpO2 90%  Gen:   Awake, no distress Resp:  Normal effort  MSK:   Patient with chronic extremity deformities Other:  Abdomen is diffusely distended with no bowel sounds palpable and moderate diffuse tenderness to palpation  Medical Decision Making  Medically screening exam initiated at 2:22 PM.  Appropriate orders placed.  ACE BERGFELD was informed that the remainder of the evaluation will be completed by another provider, this initial triage assessment does not replace that evaluation, and the importance of remaining in the ED until their evaluation is complete.  Plan labs, IV fluids, CT scan abdomen   Auston Blush, MD 02/23/24 1424

## 2024-02-23 NOTE — ED Notes (Signed)
 Called and placed PT on monitor with CCMD.

## 2024-02-23 NOTE — ED Notes (Signed)
 PT had a bowel movement where lots of dry bowel was cleaned up off of him. PT also has a small dime sized open wound on his upper buttocks.

## 2024-02-24 ENCOUNTER — Inpatient Hospital Stay (HOSPITAL_COMMUNITY)

## 2024-02-24 ENCOUNTER — Encounter (HOSPITAL_COMMUNITY): Payer: Self-pay | Admitting: Internal Medicine

## 2024-02-24 DIAGNOSIS — N179 Acute kidney failure, unspecified: Secondary | ICD-10-CM | POA: Diagnosis not present

## 2024-02-24 DIAGNOSIS — K567 Ileus, unspecified: Secondary | ICD-10-CM

## 2024-02-24 DIAGNOSIS — R1084 Generalized abdominal pain: Secondary | ICD-10-CM | POA: Diagnosis not present

## 2024-02-24 DIAGNOSIS — Z8673 Personal history of transient ischemic attack (TIA), and cerebral infarction without residual deficits: Secondary | ICD-10-CM

## 2024-02-24 DIAGNOSIS — K529 Noninfective gastroenteritis and colitis, unspecified: Secondary | ICD-10-CM | POA: Diagnosis not present

## 2024-02-24 DIAGNOSIS — L8990 Pressure ulcer of unspecified site, unspecified stage: Secondary | ICD-10-CM

## 2024-02-24 DIAGNOSIS — I10 Essential (primary) hypertension: Secondary | ICD-10-CM

## 2024-02-24 DIAGNOSIS — E876 Hypokalemia: Secondary | ICD-10-CM

## 2024-02-24 DIAGNOSIS — Z8679 Personal history of other diseases of the circulatory system: Secondary | ICD-10-CM

## 2024-02-24 LAB — COMPREHENSIVE METABOLIC PANEL WITH GFR
ALT: 11 U/L (ref 0–44)
AST: 11 U/L — ABNORMAL LOW (ref 15–41)
Albumin: 2.6 g/dL — ABNORMAL LOW (ref 3.5–5.0)
Alkaline Phosphatase: 63 U/L (ref 38–126)
Anion gap: 12 (ref 5–15)
BUN: 9 mg/dL (ref 6–20)
CO2: 21 mmol/L — ABNORMAL LOW (ref 22–32)
Calcium: 8.1 mg/dL — ABNORMAL LOW (ref 8.9–10.3)
Chloride: 107 mmol/L (ref 98–111)
Creatinine, Ser: 1.24 mg/dL (ref 0.61–1.24)
GFR, Estimated: >60 mL/min (ref >60)
Glucose, Bld: 97 mg/dL (ref 70–99)
Potassium: 2.9 mmol/L — ABNORMAL LOW (ref 3.5–5.1)
Sodium: 140 mmol/L (ref 135–145)
Total Bilirubin: 0.9 mg/dL (ref 0.0–1.2)
Total Protein: 5.9 g/dL — ABNORMAL LOW (ref 6.5–8.1)

## 2024-02-24 LAB — URINALYSIS, ROUTINE W REFLEX MICROSCOPIC
Bilirubin Urine: NEGATIVE
Glucose, UA: NEGATIVE mg/dL
Hgb urine dipstick: NEGATIVE
Ketones, ur: NEGATIVE mg/dL
Leukocytes,Ua: NEGATIVE
Nitrite: NEGATIVE
Protein, ur: NEGATIVE mg/dL
Specific Gravity, Urine: 1.035 — ABNORMAL HIGH (ref 1.005–1.030)
pH: 5 (ref 5.0–8.0)

## 2024-02-24 LAB — MRSA NEXT GEN BY PCR, NASAL: MRSA by PCR Next Gen: DETECTED — AB

## 2024-02-24 LAB — C DIFFICILE QUICK SCREEN W PCR REFLEX
C Diff antigen: NEGATIVE
C Diff interpretation: NOT DETECTED
C Diff toxin: NEGATIVE

## 2024-02-24 LAB — CBC
HCT: 37.9 % — ABNORMAL LOW (ref 39.0–52.0)
Hemoglobin: 13 g/dL (ref 13.0–17.0)
MCH: 28.5 pg (ref 26.0–34.0)
MCHC: 34.3 g/dL (ref 30.0–36.0)
MCV: 83.1 fL (ref 80.0–100.0)
Platelets: 189 10*3/uL (ref 150–400)
RBC: 4.56 MIL/uL (ref 4.22–5.81)
RDW: 13.5 % (ref 11.5–15.5)
WBC: 11.7 10*3/uL — ABNORMAL HIGH (ref 4.0–10.5)
nRBC: 0 % (ref 0.0–0.2)

## 2024-02-24 LAB — SODIUM, URINE, RANDOM: Sodium, Ur: 36 mmol/L

## 2024-02-24 LAB — MAGNESIUM: Magnesium: 1.9 mg/dL (ref 1.7–2.4)

## 2024-02-24 LAB — CREATININE, URINE, RANDOM: Creatinine, Urine: 112 mg/dL

## 2024-02-24 LAB — LACTIC ACID, PLASMA: Lactic Acid, Venous: 1.6 mmol/L (ref 0.5–1.9)

## 2024-02-24 MED ORDER — CHLORHEXIDINE GLUCONATE CLOTH 2 % EX PADS
6.0000 | MEDICATED_PAD | Freq: Every day | CUTANEOUS | Status: AC
  Administered 2024-02-24 – 2024-02-28 (×5): 6 via TOPICAL

## 2024-02-24 MED ORDER — POTASSIUM CHLORIDE 10 MEQ/100ML IV SOLN
10.0000 meq | INTRAVENOUS | Status: AC
  Administered 2024-02-24 (×4): 10 meq via INTRAVENOUS
  Filled 2024-02-24 (×4): qty 100

## 2024-02-24 MED ORDER — ORAL CARE MOUTH RINSE: 15.0000 mL | OROMUCOSAL | Status: DC | PRN

## 2024-02-24 MED ORDER — MUPIROCIN 2 % EX OINT
1.0000 | TOPICAL_OINTMENT | Freq: Two times a day (BID) | CUTANEOUS | Status: AC
  Administered 2024-02-24 – 2024-02-28 (×10): 1 via NASAL
  Filled 2024-02-24 (×3): qty 22

## 2024-02-24 MED ORDER — POTASSIUM CHLORIDE 10 MEQ/100ML IV SOLN
10.0000 meq | INTRAVENOUS | Status: AC
  Administered 2024-02-24 (×3): 10 meq via INTRAVENOUS
  Filled 2024-02-24 (×3): qty 100

## 2024-02-24 MED ORDER — ORAL CARE MOUTH RINSE
15.0000 mL | OROMUCOSAL | Status: DC
  Administered 2024-02-24 – 2024-03-09 (×56): 15 mL via OROMUCOSAL

## 2024-02-24 NOTE — Consult Note (Signed)
 CC: unable to obtain  Requesting provider: Dr Randal Bury  HPI: Daniel Stuart is an 58 y.o. male who was brought here from skilled nursing facility/[facility] for evaluation of abdominal distention and abdominal pain.  He has a prior history of stroke and intercranial hemorrhage and is bedbound.  Reportedly he had not had a bowel movement for 3 days and had decreased eating.  Reportedly no nausea or vomiting.  In the ER he was found to have a large bowel movement in the bed.  history of TIA/stroke/progressive leukoencephalopathy/chronic left ICA stenosis, cognitive impairment, cortical blindness, hyperlipidemia, peripheral vascular disease, chronic smoking cigarette, essential hypertension, chronic smoking cigarette, and antiphospholipid syndrome   Family member provides most of the history.  Also obtain history from the medical record.  Patient mainly just answers yes or no for me    Past Medical History:  Diagnosis Date   Anxiety    Arrhythmia as indication for cardiac pacemaker replacement 03/2020   Frequent falls 03/2020   History of loop recorder 04/2017   Stroke (cerebrum) (HCC)    Tobacco use    Urinary frequency 09/2019   Vitamin D  deficiency     Past Surgical History:  Procedure Laterality Date   FOOT SURGERY     LOOP RECORDER INSERTION N/A 04/13/2017   Procedure: Loop Recorder Insertion;  Surgeon: Tammie Fall, MD;  Location: [facility] INVASIVE CV LAB;  Service: Cardiovascular;  Laterality: N/A;   TEE WITHOUT CARDIOVERSION N/A 04/13/2017   Procedure: TRANSESOPHAGEAL ECHOCARDIOGRAM (TEE);  Surgeon: Hugh Madura, MD;  Location: [facility] ENDOSCOPY;  Service: Cardiovascular;  Laterality: N/A;    Family History  Problem Relation Age of Onset   Diabetes Mother    COPD Mother    Diabetes Father    Heart failure Father    Cancer Sister     Social:  reports that he has been smoking cigarettes. He started smoking about 46 years ago. He has a 46.3 pack-year smoking history. He has  never used smokeless tobacco. He reports that he does not drink alcohol and does not use drugs.  Allergies: No Known Allergies  Medications: I have reviewed the patient's current medications.   ROS - all of the below systems have been reviewed with the patient and positives are indicated with bold text General: chills, fever or night sweats Eyes: blurry vision or double vision ENT: epistaxis or sore throat Allergy/Immunology: itchy/watery eyes or nasal congestion Hematologic/Lymphatic: bleeding problems, blood clots or swollen lymph nodes Endocrine: temperature intolerance or unexpected weight changes Breast: new or changing breast lumps or nipple discharge Resp: cough, shortness of breath, or wheezing CV: chest pain or dyspnea on exertion GI: as per HPI GU: dysuria, trouble voiding, or hematuria MSK: joint pain or joint stiffness Neuro: TIA or stroke symptoms Derm: pruritus and skin lesion changes Psych: anxiety and depression  PE Blood pressure 117/87, pulse 100, temperature 98.2 F (36.8 C), temperature source Oral, resp. rate 16, height 5\' 6"  (1.676 m), weight 60 kg, SpO2 99%. Constitutional: NAD; not conversant; no deformities Eyes: Moist conjunctiva; no lid lag; anicteric; PERRL Neck: Trachea midline; no thyromegaly Lungs: Normal respiratory effort; no tactile fremitus CV: RRR; no palpable thrills; no pitting edema GI: Abd distended soft, some TTP rt side; no palpable hepatosplenomegaly MSK:  no clubbing/cyanosis Psychiatric: awake and alert Lymphatic: No palpable cervical or axillary lymphadenopathy Skin:no rash  Results for orders placed or performed during the hospital encounter of 02/23/24 (from the past 48 hours)  CBC  Status: Abnormal   Collection Time: 02/23/24  2:19 PM  Result Value Ref Range   WBC 15.9 (H) 4.0 - 10.5 K/uL   RBC 5.12 4.22 - 5.81 MIL/uL   Hemoglobin 14.7 13.0 - 17.0 g/dL   HCT 16.1 09.6 - 04.5 %   MCV 85.2 80.0 - 100.0 fL   MCH 28.7 26.0  - 34.0 pg   MCHC 33.7 30.0 - 36.0 g/dL   RDW 40.9 81.1 - 91.4 %   Platelets 219 150 - 400 K/uL   nRBC 0.0 0.0 - 0.2 %    Comment: Performed at [facility], [address], [place], [place] [address]  Comprehensive metabolic panel     Status: Abnormal   Collection Time: 02/23/24  2:19 PM  Result Value Ref Range   Sodium 139 135 - 145 mmol/L   Potassium 3.0 (L) 3.5 - 5.1 mmol/L   Chloride 102 98 - 111 mmol/L   CO2 24 22 - 32 mmol/L   Glucose, Bld 98 70 - 99 mg/dL    Comment: Glucose reference range applies only to samples taken after fasting for at least 8 hours.   BUN 9 6 - 20 mg/dL   Creatinine, Ser 6.21 (H) 0.61 - 1.24 mg/dL   Calcium  8.8 (L) 8.9 - 10.3 mg/dL   Total Protein 7.6 6.5 - 8.1 g/dL   Albumin 3.4 (L) 3.5 - 5.0 g/dL   AST 15 15 - 41 U/L   ALT 13 0 - 44 U/L   Alkaline Phosphatase 88 38 - 126 U/L   Total Bilirubin 1.0 0.0 - 1.2 mg/dL   GFR, Estimated >30 >86 mL/min    Comment: (NOTE) Calculated using the CKD-EPI Creatinine Equation (2021)    Anion gap 13 5 - 15    Comment: Performed at [facility], [address] [place], [place] [address]    CT ABDOMEN PELVIS W CONTRAST Result Date: 02/23/2024 CLINICAL DATA:  Acute abdominal pain. EXAM: CT ABDOMEN AND PELVIS WITH CONTRAST TECHNIQUE: Multidetector CT imaging of the abdomen and pelvis was performed using the standard protocol following bolus administration of intravenous contrast. RADIATION DOSE REDUCTION: This exam was performed according to the departmental dose-optimization program which includes automated exposure control, adjustment of the mA and/or kV according to patient size and/or use of iterative reconstruction technique. CONTRAST:  75mL OMNIPAQUE  IOHEXOL  350 MG/ML SOLN COMPARISON:  None Available. FINDINGS: Lower chest: There are patchy ground-glass opacities in the bilateral lower lobes. Hepatobiliary: No focal liver abnormality is seen. No gallstones, gallbladder wall thickening, or biliary  dilatation. Pancreas: Unremarkable. No pancreatic ductal dilatation or surrounding inflammatory changes. Spleen: Normal in size without focal abnormality. Adrenals/Urinary Tract: There a are punctate bilateral renal calculi measuring up to 4 mm. Otherwise, the kidneys, adrenal glands and bladder are within normal limits. Stomach/Bowel: The colon is diffusely dilated and contains air-fluid levels. There is some mild circumferential wall thickening of the anterior and peripheral bubbly lucencies are seen in the ascending colon which may be related to fecal content; however, pneumatosis would be difficult to exclude. There is no free intraperitoneal air. The appendix, small bowel, and stomach are within normal limits. Vascular/Lymphatic: Aortic atherosclerosis. No enlarged abdominal or pelvic lymph nodes. Reproductive: Prostate is unremarkable. Other: There is a small fat containing umbilical hernia. Musculoskeletal: No acute or significant osseous findings.  The IMPRESSION: 1. Diffuse dilatation of the colon with air-fluid levels worrisome for colonic ileus the. Mild wall thickening of the transverse compatible with colitis  cannot exclude pneumatosis in the ascending colon. Correlate clinically for ischemia. 2. No free intraperitoneal air. 3. Nonobstructing bilateral renal calculi. 4. Patchy ground-glass opacities in the bilateral lower lobes worrisome for infection or inflammation. 5. Aortic atherosclerosis. Aortic Atherosclerosis (ICD10-I70.0). Electronically Signed   By: Tyron Gallon M.D.   On: 02/23/2024 20:02    Imaging: Personally reviewed  A/P: INIKO ROBLES is an 58 y.o. male with  Abdominal distension Colonic ileus Possible pneumatosis Hypokalemia Elevated Cr   The patient does have abdominal distention and some tenderness on exam.  I am not sure if this is pneumatosis/in the bowel wall or intraluminal gas.  His baseline hemoglobin is around 12 and his hemoglobin on arrival to the ER was  14.7.  So it is possible he may be hemoconcentrated and his elevated white count may actually be due to hemoconcentration  A lactate level was requested several hours ago but is yet to be drawn.  He is resting comfortably.  He is not ill-appearing.  He is afebrile.  He is mostly not tachycardic.  He is not an extremis.  He does not have guarding or peritonitis.  I do not believe he needs urgent surgical exploration.  Recommend bowel rest, Correct electrolyte abnormalities especially potassium IV fluids Trend lactate Repeat abdominal exam in morning  Data reviewed I reviewed labs from April 25 June 2023, reviewed ED notes, reviewed hospitalist H&P reviewed hospital discharge summary from August 2024, echocardiogram from August 2024  Marianna Shirk. Elvan Hamel, MD, FACS General, Bariatric, & Minimally Invasive Surgery [facility]

## 2024-02-24 NOTE — Subjective & Objective (Addendum)
 Pt seen and examined.  Started on TPN yesterday. K has improved some. Slightly hypernatremia today. Abd is marginally less distended with NG tube to intermittent suction. Pt wants something to drink.  Otherwise no complaints of pain.  Large liquid BM recorded this AM.

## 2024-02-24 NOTE — Assessment & Plan Note (Addendum)
 02-24-2024 chronic. Resolved. Not a candidate for systemic anticoagulation due to prior ICH while taking Eliquis.  02-26-2024 chronic.  02-27-2024 chronic.  02-28-2024 stable.

## 2024-02-24 NOTE — Progress Notes (Signed)
 Stool sample sent for C-diff and results negative. MD notified and d/c enteric precautions.  MRSA nasal swab sent and waiting for results.

## 2024-02-24 NOTE — Progress Notes (Signed)
 Admission notes  New pt came from ED. Pt is A&O to self. Confused. VSS, on room air. Sinus rhythm on the tele monitor.  Abdomen distended and c/o pain. Aldean Hummingbird, MD came at bedside to evaluate.   Medications given as ordered and labs done as ordered.   Pt is poor historian and pt's sister- Arthurine Lass was at bedside and admission questions /information obtained from sister. 2 RN skin assessment done with Irean Manner, Charge RN  Educated pt on falls and safety precautions, call bell and plan of care, pt needs reinforcement.

## 2024-02-24 NOTE — Hospital Course (Addendum)
 Mr. Colaluca is a 58 yo male with PMH ICH with residual right weakness and bedbound, progressive leukoencephalopathy, PAD/left ICA stenosis, cortical blindness, HLD, HTN, antiphospholipid syndrome.  Presented to the hospital with complaints of abdominal pain and distention.  Treated for colonic ileus with pseudoobstruction.  GI was consulted.  General surgery was consulted as well.  Conservatively managed.  Was on TPN.  Now diet advanced.  Also continues to have poor p.o. intake.  Assessment and Plan: Pseudoobstruction of colon Presented with abdominal pain and distention. CT scan shows evidence of colonic distention without any obstruction.  Concern for ileus. General surgery was consulted.  NG tube was inserted. Treated conservatively with IV fluid and bowel rest.  Was given TPN while he had bowel rest. GI was consulted as well.   As the ileus resolved patient was gradually advanced Smog enema ordered and tolerated well Patient has been chronically on baclofen .  There is a concern that that can lead to intestinal obstruction as well as ileus.  Recommended patient to taper this medication off (done) - Currently on full liquid diet. Tolerating well, can ADAT at discharge - Repeat abdominal x-ray on 03/07/2024 shows stable gaseous distention.  No vomiting reported - bowel regimen continued at discharge; outpt followup with GI planned as well  - overall, despite abd remaining distended with hyperpitched sounds, he is asymptomatic and tolerating diet with regular BM. Discussed with GI, and felt to be stable for discharge and if were to worsen again, would need to be considered for re-admission, but at this time he remains stable and improved   Right upper extremity edema. Doppler negative. Monitor  Low-grade fever -resolved Intermittently low-grade temp No further fever Monitor clinically  Mild renal dysfunction.  No AKI. Hypokalemia Hypomagnesemia. Baseline creatinine appears to be around  1.2 Serum creatinine on admission was 1.4 Not meeting AKI criteria Treated with IV hydration Potassium and magnesium  have been replaced  Hyperglycemia Resolved Hemoglobin A1c 5.8  Hypophosphatemia Corrected  HLD On statin.  Chronic spasticity On gabapentin  Tapering of baclofen  baclofen  dose due to with concern for ileus  Cerebral myeloid angiopathy History of intracerebral hemorrhage Progressive leukoencephalopathy Spastic hemiparesis of right side Sees neurology outpatient

## 2024-02-24 NOTE — Assessment & Plan Note (Signed)
 Pressure Injury 02/24/24 Buttocks Left Stage 2 -  Partial thickness loss of dermis presenting as a shallow open injury with a red, pink wound bed without slough. open and red (Active)  02/24/24 0000  Location: Buttocks  Location Orientation: Left  Staging: Stage 2 -  Partial thickness loss of dermis presenting as a shallow open injury with a red, pink wound bed without slough.  Wound Description (Comments): open and red  Present on Admission: Yes     Pressure Injury 02/24/24 Ankle Posterior;Right;Other (Comment) Stage 2 -  Partial thickness loss of dermis presenting as a shallow open injury with a red, pink wound bed without slough. black and appears healing pressure injury (Active)  02/24/24 0000  Location: Ankle  Location Orientation: Posterior;Right;Other (Comment)  Staging: Stage 2 -  Partial thickness loss of dermis presenting as a shallow open injury with a red, pink wound bed without slough.  Wound Description (Comments): black and appears healing pressure injury  Present on Admission: Yes

## 2024-02-24 NOTE — Assessment & Plan Note (Addendum)
 02-24-2024 resolved with IVF.  02-25-2024 continue with IVF.  02-26-2024 continue with IVF.  02-27-2024 improved with TPN.  02-28-2024 resolved.

## 2024-02-24 NOTE — Progress Notes (Signed)
  Lactic acid level 1.6 within normal range.  Will continue to trend lactic acid and serial abdominal exam.  There is no concern for mesenteric ischemia at this time.   Lashonda Sonneborn, MD [facility] 02/24/2024, 3:13 AM

## 2024-02-24 NOTE — Assessment & Plan Note (Addendum)
 02-24-2024  continue with IVF, IV ABX. General surgery consulted. *update. Discussed with general surgery. No need for further IV ABX. Will stop IV Zosyn .  02-25-2024 discussed with surgery yesterday. No role for abx as surgery does not think he has colitis.

## 2024-02-24 NOTE — Consult Note (Addendum)
 Consultation  Referring Provider:    Dr. Farrel Hones  Primary Care Physician:  Paseda, Folashade R, [name] Primary Gastroenterologist: Bernardine Bridegroom primary Reason for Consultation:   Ileus         HPI:   Daniel Stuart is a 58 y.o. 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    At time of presentation patient reported not have a bowel for 3 days and is not eating well.  He had no nausea or vomiting.  He had tried taking a laxative without any improvement.  At baseline is bedbound nonambulatory due to prior neurological deficit.    Today, patient does not answer many of my questions when asked.  He tells me he is not having any abdominal pain, he is not sure if he is passing gas or not.  Tells me it has been a while since he had a bowel movement, unsure how long.  When asked about an NG tube he tells me he did rather not have this done.  No other complaints for the patient today.    Denies fever, chills, weight loss or blood in his stool.  ED course: CBC with leukocytosis of 15.9, normal RBC, hemoglobin and platelet count, CMP showing low potassium at 3; CTAP showed diffuse dilation of the colon with air-fluid levels worrisome for colonic ileus with mild wall thickening of the transverse colon compatible with colitis could not exclude pneumatosis in the ascending colon, correlate clinically for ischemia; surgery was consulted and recommended conservative management-started on Zosyn   Hospital course: Patient being followed by surgical team, was passing loose stool this morning but still distended,  recommended continued bowel rest, no need for surgery now but question need for NG tube, recommended abdominal films-x-rays today with stable diffuse colonic dilation noted most consistent with ileus, potassium is still low at 2.9  GI history: None  Past Medical History:  Diagnosis Date   Anxiety    Arrhythmia as indication for cardiac pacemaker replacement 03/2020   Frequent falls 03/2020   History of loop recorder 04/2017   ICH (intracerebral hemorrhage) (HCC) 06/19/2023   Stroke (cerebrum) (HCC)    Tobacco use    Urinary frequency 09/2019   Vitamin D  deficiency     Past Surgical History:  Procedure Laterality Date   FOOT SURGERY     LOOP RECORDER INSERTION N/A 04/13/2017   Procedure: Loop Recorder Insertion;  Surgeon: Tammie Fall, MD;  Location: [facility];  Service: Cardiovascular;  Laterality: N/A;   TEE WITHOUT CARDIOVERSION N/A 04/13/2017   Procedure: TRANSESOPHAGEAL ECHOCARDIOGRAM (TEE);  Surgeon: Hugh Madura, MD;  Location: [facility] ENDOSCOPY;  Service: Cardiovascular;  Laterality: N/A;    Family History  Problem Relation Age of Onset   Diabetes Mother    COPD Mother    Diabetes Father    Heart failure Father    Cancer Sister     Social History   Tobacco Use   Smoking status: Every Day    Current packs/day: 1.00    Average packs/day: 1 pack/day for 46.3 years (46.3 ttl pk-yrs)    Types: Cigarettes    Start date: 11/01/1977   Smokeless tobacco:  Never  Vaping Use   Vaping status: Never Used  Substance Use Topics   Alcohol use: No   Drug use: No    Prior to Admission medications   Medication Sig Start Date End Date Taking? Authorizing Provider  acetaminophen  (TYLENOL ) 325 MG tablet Take 2 tablets (650 mg total) by mouth every 6 (six) hours as needed for mild pain, moderate pain, fever or headache (or temp > 37.5 C (99.5 F)). 06/22/23  Yes Hongalgi, Anand D, MD  atorvastatin  (LIPITOR ) 80 MG tablet TAKE 1 TABLET (80 MG TOTAL) BY MOUTH EVERY  EVENING. Patient taking differently: Take 80 mg by mouth daily. 02/22/22 02/23/24 Yes Passmore, Ramona Burner I, NP  baclofen  (LIORESAL ) 10 MG tablet Take 10 mg by mouth 3 (three) times daily. 05/25/23  Yes [provider]  cyanocobalamin  1000 MCG tablet Take 1 tablet (1,000 mcg total) by mouth daily. 09/11/22  Yes Gwendalyn Lemma, MD  gabapentin  (NEURONTIN ) 300 MG capsule TAKE 1 CAPSULE BY MOUTH BY MOUTH TWO TIMES DAILY Patient taking differently: Take 300 mg by mouth 2 (two) times daily. 04/22/22 02/23/24 Yes Jaffe, Adam R, DO  pantoprazole  (PROTONIX ) 40 MG tablet Take 1 tablet (40 mg total) by mouth at bedtime. 06/22/23  Yes Hongalgi, Anand D, MD  senna-docusate (SENOKOT-S) 8.6-50 MG tablet Take 1 tablet by mouth 2 (two) times daily. 06/22/23  Yes Hongalgi, Anand D, MD  cholecalciferol (VITAMIN D3) 25 MCG (1000 UNIT) tablet Take 1,000 Units by mouth daily. Patient not taking: Reported on 02/23/2024    [provider]  citalopram  (CELEXA ) 10 MG tablet Take 1 tablet (10 mg total) by mouth daily. Patient not taking: Reported on 02/23/2024 11/08/22   Lisabeth Rider, MD  nicotine  (NICODERM CQ  - DOSED IN MG/24 HOURS) 14 mg/24hr patch Place 1 patch (14 mg total) onto the skin daily. Patient not taking: Reported on 08/02/2023 06/23/23   Casey Clay, MD  oxybutynin  (DITROPAN -XL) 10 MG 24 hr tablet Take 10 mg by mouth at bedtime. Patient not taking: Reported on 02/23/2024    [provider]  thiamine  (VITAMIN B1) 100 MG tablet Take 1 tablet (100 mg total) by mouth daily. Patient not taking: Reported on 02/23/2024 09/10/22   Gwendalyn Lemma, MD    Current Facility-Administered Medications  Medication Dose Route Frequency Provider Last Rate Last Admin   0.9 %  sodium chloride  infusion  250 mL Intravenous PRN Sundil, Subrina, MD       acetaminophen  (TYLENOL ) tablet 650 mg  650 mg Oral Q6H PRN Sundil, Subrina, MD       Or   acetaminophen  (TYLENOL ) suppository 650 mg  650 mg Rectal Q6H PRN  Sundil, Subrina, MD       baclofen  (LIORESAL ) tablet 10 mg  10 mg Oral TID Sundil, Subrina, MD   10 mg at 02/24/24 1443   Chlorhexidine  Gluconate Cloth 2 % PADS 6 each  6 each Topical Daily Unk Garb, DO       gabapentin  (NEURONTIN ) capsule 300 mg  300 mg Oral BID Sundil, Subrina, MD   300 mg at 02/24/24 1610   lactated ringers  infusion   Intravenous Continuous Unk Garb, DO 150 mL/hr at 02/24/24 0218 New Bag at 02/24/24 0218   mupirocin  ointment (BACTROBAN ) 2 % 1 Application  1 Application Nasal BID Unk Garb, DO   1 Application at 02/24/24 1204   Oral care mouth rinse  15 mL Mouth Rinse 4 times per day Sundil, Subrina, MD   15 mL at 02/24/24 1443  Oral care mouth rinse  15 mL Mouth Rinse PRN Sundil, Subrina, MD       prochlorperazine  (COMPAZINE ) injection 10 mg  10 mg Intravenous Q6H PRN Ulice Gamer, Subrina, MD       sodium chloride  flush (NS) 0.9 % injection 3 mL  3 mL Intravenous Q12H Sundil, Subrina, MD   3 mL at 02/24/24 0908   sodium chloride  flush (NS) 0.9 % injection 3 mL  3 mL Intravenous PRN Sundil, Subrina, MD        Allergies as of 02/23/2024   (No Known Allergies)     Review of Systems:    Constitutional: No weight loss, fever or chills Skin: No rash  Cardiovascular: No chest pain Respiratory: No SOB  Gastrointestinal: See HPI and otherwise negative Genitourinary: No dysuria Neurological: No headache, dizziness or syncope Musculoskeletal: No new muscle or joint pain Hematologic: No bleeding Psychiatric: No history of depression or anxiety    Physical Exam:  Vital signs in last 24 hours: Temp:  [98.2 F (36.8 C)-99.3 F (37.4 C)] 99.3 F (37.4 C) (04/25 1533) Pulse Rate:  [83-104] 84 (04/25 1533) Resp:  [13-17] 17 (04/25 1533) BP: (99-129)/(66-87) 99/66 (04/25 1533) SpO2:  [88 %-99 %] 88 % (04/25 1533) Weight:  [60 kg] 60 kg (04/24 1612) Last BM Date :  (unknown) General:   Pleasant AA male appears to be in NAD, Well developed, Well nourished, alert and  cooperative Head:  Normocephalic and atraumatic. Eyes:   PEERL, EOMI. No icterus. Conjunctiva pink. Ears:  Normal auditory acuity. Neck:  Supple Throat: Oral cavity and pharynx without inflammation, swelling or lesion. Teeth in good condition. Lungs: Respirations even and unlabored. Lungs clear to auscultation bilaterally.   No wheezes, crackles, or rhonchi.  Heart: Normal S1, S2. No MRG. Regular rate and rhythm. No peripheral edema, cyanosis or pallor.  Abdomen:  Soft, moderate to marked distention, nontender. No rebound or guarding.  Hypertympanic, tinkling bowel sounds in all 4 quadrants. No appreciable masses or hepatomegaly. Rectal:  Not performed.  Msk:  Symmetrical without gross deformities. Peripheral pulses intact.  Extremities:  Without edema, no deformity or joint abnormality. Normal ROM, normal sensation. Neurologic:  Alert and  oriented x4;  grossly normal neurologically. CN II-XII intact.  Skin:   Dry and intact without significant lesions or rashes. Psychiatric: Demonstrates good judgement and reason without abnormal affect or behaviors.   LAB RESULTS: Recent Labs    02/23/24 1419 02/24/24 0706  WBC 15.9* 11.7*  HGB 14.7 13.0  HCT 43.6 37.9*  PLT 219 189   BMET Recent Labs    02/23/24 1419 02/24/24 0706  NA 139 140  K 3.0* 2.9*  CL 102 107  CO2 24 21*  GLUCOSE 98 97  BUN 9 9  CREATININE 1.32* 1.24  CALCIUM  8.8* 8.1*   LFT Recent Labs    02/24/24 0706  PROT 5.9*  ALBUMIN 2.6*  AST 11*  ALT 11  ALKPHOS 63  BILITOT 0.9   STUDIES: DG Abd Portable 1V Result Date: 02/24/2024 CLINICAL DATA:  Ileus. EXAM: PORTABLE ABDOMEN - 1 VIEW COMPARISON:  February 23, 2024. FINDINGS: Stable diffuse colonic dilatation is noted most consistent with ileus. No definite small bowel dilatation is noted. Bilateral nephrolithiasis is again noted. IMPRESSION: Stable diffuse colonic dilatation most consistent with ileus. Electronically Signed   By: Rosalene Colon M.D.   On:  02/24/2024 14:42   CT ABDOMEN PELVIS W CONTRAST Result Date: 02/23/2024 CLINICAL DATA:  Acute abdominal pain. EXAM: CT  ABDOMEN AND PELVIS WITH CONTRAST TECHNIQUE: Multidetector CT imaging of the abdomen and pelvis was performed using the standard protocol following bolus administration of intravenous contrast. RADIATION DOSE REDUCTION: This exam was performed according to the departmental dose-optimization program which includes automated exposure control, adjustment of the mA and/or kV according to patient size and/or use of iterative reconstruction technique. CONTRAST:  75mL OMNIPAQUE  IOHEXOL  350 MG/ML SOLN COMPARISON:  None Available. FINDINGS: Lower chest: There are patchy ground-glass opacities in the bilateral lower lobes. Hepatobiliary: No focal liver abnormality is seen. No gallstones, gallbladder wall thickening, or biliary dilatation. Pancreas: Unremarkable. No pancreatic ductal dilatation or surrounding inflammatory changes. Spleen: Normal in size without focal abnormality. Adrenals/Urinary Tract: There a are punctate bilateral renal calculi measuring up to 4 mm. Otherwise, the kidneys, adrenal glands and bladder are within normal limits. Stomach/Bowel: The colon is diffusely dilated and contains air-fluid levels. There is some mild circumferential wall thickening of the anterior and peripheral bubbly lucencies are seen in the ascending colon which may be related to fecal content; however, pneumatosis would be difficult to exclude. There is no free intraperitoneal air. The appendix, small bowel, and stomach are within normal limits. Vascular/Lymphatic: Aortic atherosclerosis. No enlarged abdominal or pelvic lymph nodes. Reproductive: Prostate is unremarkable. Other: There is a small fat containing umbilical hernia. Musculoskeletal: No acute or significant osseous findings.  The IMPRESSION: 1. Diffuse dilatation of the colon with air-fluid levels worrisome for colonic ileus the. Mild wall thickening of  the transverse compatible with colitis cannot exclude pneumatosis in the ascending colon. Correlate clinically for ischemia. 2. No free intraperitoneal air. 3. Nonobstructing bilateral renal calculi. 4. Patchy ground-glass opacities in the bilateral lower lobes worrisome for infection or inflammation. 5. Aortic atherosclerosis. Aortic Atherosclerosis (ICD10-I70.0). Electronically Signed   By: Tyron Gallon M.D.   On: 02/23/2024 20:02    Impression / Plan:   Impression: 1.  Ileus/colitis: Presented to the ED from assisted living facility with no bowel movement for 3 days and abdominal distention generalized abdominal pain, no nausea or vomiting, hemodynamically stable with a leukocytosis of 15.9 evidence of AKI and hypokalemia, CT with dilation of the colon and air-fluid levels worrisome for colonic ileus with mild wall thickening of the transverse colon compatible with colitis but could not exclude pneumatosis in the ascending colon, repeat x-ray today with stable ileus; consider relation to hypokalemia +/- chronic constipation (unable to get this history from him) 2.  Hypokalemia: Low potassium level initially at 3--> 2.9 overnight 3.  AKI on CKD stage II 4.  History of severe encephalopathy and intracranial hemorrhage as well as CVA/TIA and progressive leukoencephalopathy: Previously on aspirin , Eliquis and Plavix  but everything was discontinued after patient developed intracranial hemorrhage 5.  Chronically bedbound 6.  History of cortical blindness 7.  History of antiphospholipid syndrome  Plan: 1.  Correct potassium-will leave this to the hospitalist team, magnesium pending 2.  Patient being bedbound does not help the situation 3.  Continue n.p.o. for now.   4.  NG tube discussed with sister who is his POA (unofficially).  She would like us  to proceed with placing this after discussion.  I have ordered. 5.  Please await further recommendations from Dr. Rosaline Coma.  Thank you for your kind  consultation, we will continue to follow.  Kathy Parker [facility]  02/24/2024, 3:59 PM

## 2024-02-24 NOTE — Assessment & Plan Note (Addendum)
 02-24-2024 po statin on hold for now.  02-26-2024 continue to hold statin due to NPO status.  02-27-2024 continue to hold statin. NPO.  02-28-2024 restart statin when taking po solids.

## 2024-02-24 NOTE — Assessment & Plan Note (Addendum)
 02-24-2024 pt does not have SBO. He has colonic ileus. Pt being followed by general surgery. Remains on IVF.  02-25-2024 GI wants him to have NG for gastric decompression. RN unable to place at bedside. Will order NG tube by radiology.  02-26-2024 NG tube placed by daytime RN yesterday. On intermittent suction. NG tube with old blood. Will start IV protonix  40 mg bid.  Repeat KUB today shows continue large bowel dilatation. Not significantly changed on my read even after NG tube placement.  Had discussed with RD yesterday. Pt without significant nutrition for nearly 1 week. No evidence of systemic infection. Will place PICC and start TPN   02-27-2024 awaiting Gi to f/u on patient. Has had over 48 hours of NG tube decompression with only marginal improvement in colonic distension/ileus. Remains on TPN for nutrition.  Pt had large type 7 liquid BM today. Will repeat KUB.  02-28-2024 seems to have resolved. Will DC NG tube and finish out TPN bag today and stop it.  Continue with clear liquids today. Continue with BID miralax.  Maybe adv to full liquids today if he continues with Bms.

## 2024-02-24 NOTE — Progress Notes (Addendum)
 PROGRESS NOTE    Daniel Stuart  WUJ:[id number] DOB: July 05, 1966 DOA: 02/23/2024 PCP: Paseda, Folashade R, [name]  Subjective: Pt seen and examined. Pt is awake and alert. Abd is distended. He does not know if he has passed any flatus or stool yet. On IVF.   Hospital Course: HPI: Daniel Stuart is a 58 y.o. 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  Reported patient did not have any bowel movement for 3 days and not eating well.  Denies any nausea and vomiting.  Patient stating that he has tried laxative without any improvement.  At the baseline patient is bedbound nonambulatory due to prior neurological deficit. Denies any fever chills, nausea, vomiting, and diarrhea.     ED Course:  At presentation to ED patient is hemodynamically stable CBC showing leukocytosis 15.9.  Normal RBC, hemoglobin and platelet count. CMP showing low potassium 3, elevated creatinine 1.3, low albumin 3.4.  Pending lactic acid level.   CT abdomen pelvis: 1. Diffuse dilatation of the colon with air-fluid levels worrisome for colonic ileus the. Mild wall thickening of the transverse compatible with colitis cannot exclude pneumatosis in the ascending colon. Correlate clinically for ischemia. 2. No free intraperitoneal air. 3. Nonobstructing bilateral renal calculi. 4. Patchy ground-glass opacities in the bilateral lower lobes worrisome for infection or inflammation. 5. Aortic atherosclerosis.   Dr. Guss Legacy last consulted and spoke with general surgery  Dr. Aldean Hummingbird.  Per general surgery in the setting of ileus recommended conservative management, trend lactic acid and there is no need for surgery to involved in this case. However if something emergently arises need to inform surgery to assess the patient.     Due to colitis in the ED patient has been started on Zosyn .  Due to hypokalemia repleted with IV KCl 40 mEq.  Lactic acid has been ordered.  Patient also received 500 mL of LR bolus.     Hospitalist has been consulted for further evaluation and management of ileus, colitis and AKI.  Significant Events: Admitted 02/23/2024 for ileus   Significant Labs: WBC 15.9, HgB 14.7, plt 219 Na 139, K 3.0, CO2 of 24, BUN 9, Scr 1.32, glu 98 UA negative C. Diff negative antigen, negative toxin  Significant Imaging Studies: CT abd/pelvis Diffuse dilatation of the colon with air-fluid levels worrisome for colonic ileus the. Mild wall thickening of the transverse compatible with colitis cannot exclude pneumatosis in the ascending colon. Correlate clinically for ischemia. 2. No free intraperitoneal air.  3. Nonobstructing bilateral renal calculi. 4. Patchy ground-glass opacities in the bilateral lower lobes worrisome for infection or  inflammation. 5. Aortic atherosclerosis.  Antibiotic Therapy: Anti-infectives (From admission, onward)    Start     Dose/Rate Route Frequency Ordered Stop   02/24/24 0600  piperacillin -tazobactam (ZOSYN ) IVPB 3.375 g        3.375 g 12.5 mL/hr over 240 Minutes Intravenous Every 8 hours 02/23/24 2127     02/23/24 2015  piperacillin -tazobactam (ZOSYN ) IVPB 3.375 g        3.375 g 100 mL/hr over 30 Minutes Intravenous  Once 02/23/24 2010 02/23/24 2224       Procedures:   Consultants:     Assessment and Plan: *  Ileus (HCC) 02-24-2024 pt does not have SBO. He has colonic ileus. Pt being followed by general surgery. Remains on IVF.  Colitis 02-24-2024  continue with IVF, IV ABX. General surgery  consulted. *update. Discussed with general surgery. No need for further IV ABX. Will stop IV Zosyn .  AKI (acute kidney injury) (HCC) 02-24-2024 resolved with IVF.  Hypokalemia 02-24-2024 continue with IV Kcl replacement.  Pressure injury of skin Pressure Injury 02/24/24 Buttocks Left Stage 2 -  Partial thickness loss of dermis presenting as a shallow open injury with a red, pink wound bed without slough. open and red (Active)  02/24/24 0000  Location: Buttocks  Location Orientation: Left  Staging: Stage 2 -  Partial thickness loss of dermis presenting as a shallow open injury with a red, pink wound bed without slough.  Wound Description (Comments): open and red  Present on Admission: Yes     Pressure Injury 02/24/24 Ankle Posterior;Right;Other (Comment) Stage 2 -  Partial thickness loss of dermis presenting as a shallow open injury with a red, pink wound bed without slough. black and appears healing pressure injury (Active)  02/24/24 0000  Location: Ankle  Location Orientation: Posterior;Right;Other (Comment)  Staging: Stage 2 -  Partial thickness loss of dermis presenting as a shallow open injury with a red, pink wound bed without slough.  Wound Description (Comments): black and appears healing pressure injury  Present on Admission: Yes      Essential hypertension 02-24-2024 stable.   History of intracranial hemorrhage 02-24-2024 chronic. Resolved. Not a candidate for systemic anticoagulation due to prior ICH while taking Eliquis.  History of CVA (cerebrovascular accident) 02-24-2024 chronic. Resolved. Not a candidate for systemic anticoagulation due to prior ICH while taking Eliquis.  Hyperlipidemia 02-24-2024 po statin on hold for now.  DVT prophylaxis: SCDs Start: 02/23/24 2056 Place TED hose Start: 02/23/24 2056    Code Status: Full Code Family Communication: no family at bedside Disposition Plan: return to SNF Reason for continuing need for hospitalization:  remains NPO. On IV ABX, IVF.  Objective: Vitals:   02/23/24 2215 02/23/24 2230 02/23/24 2347 02/24/24 0442  BP: 116/84 111/84 117/87 99/69  Pulse: 99 (!) 103 100 83  Resp: 13 14 16 16   Temp:   98.2 F (36.8 C) 98.2 F (36.8 C)  TempSrc:   Oral Axillary  SpO2: (!) 89% 91% 99% 95%  Weight:      Height:        Intake/Output Summary (Last 24 hours) at 02/24/2024 1137 Last data filed at 02/24/2024 0630 Gross per 24 hour  Intake 1955.01 ml  Output 500 ml  Net 1455.01 ml   Filed Weights   02/23/24 1612  Weight: 60 kg    Examination:  Physical Exam Vitals and nursing note reviewed.  Constitutional:      General: He is not in acute distress.    Appearance: He is not toxic-appearing.     Comments: Chronically ill appearing Awake and alert  HENT:     Head: Normocephalic and atraumatic.  Eyes:     General: No scleral icterus. Cardiovascular:     Rate and Rhythm: Normal rate and regular rhythm.  Pulmonary:     Effort: Pulmonary effort is normal.     Breath sounds: Normal breath sounds.  Abdominal:     General: Bowel sounds are decreased.     Tenderness: There is abdominal tenderness.     Comments: Hypoactive BS Distended Non-tender  Skin:    General: Skin is warm and dry.  Capillary Refill: Capillary refill takes less than 2 seconds.  Neurological:     Comments: dysarthria     Data Reviewed: I have personally reviewed following labs and imaging studies  CBC: Recent Labs  Lab 02/23/24 1419 02/24/24 0706  WBC 15.9* 11.7*  HGB 14.7 13.0  HCT 43.6 37.9*  MCV 85.2 83.1  PLT 219 189   Basic Metabolic Panel: Recent Labs  Lab 02/23/24 0055 02/23/24 1419 02/24/24 0706  NA  --  139 140  K  --  3.0* 2.9*  CL  --  102 107  CO2  --  24 21*  GLUCOSE  --  98 97  BUN  --  9 9  CREATININE  --  1.32* 1.24  CALCIUM   --  8.8* 8.1*  MG 1.9  --   --    GFR: Estimated Creatinine Clearance: 54.4 mL/min (by C-G formula based on SCr of 1.24 mg/dL). Liver Function  Tests: Recent Labs  Lab 02/23/24 1419 02/24/24 0706  AST 15 11*  ALT 13 11  ALKPHOS 88 63  BILITOT 1.0 0.9  PROT 7.6 5.9*  ALBUMIN 3.4* 2.6*   Sepsis Labs: Recent Labs  Lab 02/24/24 0038  LATICACIDVEN 1.6    Recent Results (from the past 240 hours)  C Difficile Quick Screen w PCR reflex     Status: None   Collection Time: 02/24/24  2:20 AM   Specimen: STOOL  Result Value Ref Range Status   C Diff antigen NEGATIVE NEGATIVE Final   C Diff toxin NEGATIVE NEGATIVE Final   C Diff interpretation No C. difficile detected.  Final    Comment: Performed at [facility], [address] [place], [place] [address]  MRSA Next Gen by PCR, Nasal     Status: Abnormal   Collection Time: 02/24/24  4:45 AM   Specimen: Nasal Mucosa; Nasal Swab  Result Value Ref Range Status   MRSA by PCR Next Gen DETECTED (A) NOT DETECTED Final    Comment: RESULT CALLED TO, READ BACK BY AND VERIFIED WITH: M YOUNG,RN@0656  02/24/24 MK (NOTE) The GeneXpert MRSA Assay (FDA approved for NASAL specimens only), is one component of a comprehensive MRSA colonization surveillance program. It is not intended to diagnose MRSA infection nor to guide or monitor treatment for MRSA infections. Test performance is not FDA approved in patients less than 40 years old. Performed at [facility], [address] [place], [place] [address]      Radiology Studies: CT ABDOMEN PELVIS W CONTRAST Result Date: 02/23/2024 CLINICAL DATA:  Acute abdominal pain. EXAM: CT ABDOMEN AND PELVIS WITH CONTRAST TECHNIQUE: Multidetector CT imaging of the abdomen and pelvis was performed using the standard protocol following bolus administration of intravenous contrast. RADIATION DOSE REDUCTION: This exam was performed according to the departmental dose-optimization program which includes automated exposure control, adjustment of the mA and/or kV according to patient size and/or use of iterative reconstruction technique. CONTRAST:   75mL OMNIPAQUE  IOHEXOL  350 MG/ML SOLN COMPARISON:  None Available. FINDINGS: Lower chest: There are patchy ground-glass opacities in the bilateral lower lobes. Hepatobiliary: No focal liver abnormality is seen. No gallstones, gallbladder wall thickening, or biliary dilatation. Pancreas: Unremarkable. No pancreatic ductal dilatation or surrounding inflammatory changes. Spleen: Normal in size without focal abnormality. Adrenals/Urinary Tract: There a are punctate bilateral renal calculi measuring up to 4 mm. Otherwise, the kidneys, adrenal glands and bladder are within normal limits. Stomach/Bowel: The colon is diffusely dilated and contains air-fluid levels. There is some  mild circumferential wall thickening of the anterior and peripheral bubbly lucencies are seen in the ascending colon which may be related to fecal content; however, pneumatosis would be difficult to exclude. There is no free intraperitoneal air. The appendix, small bowel, and stomach are within normal limits. Vascular/Lymphatic: Aortic atherosclerosis. No enlarged abdominal or pelvic lymph nodes. Reproductive: Prostate is unremarkable. Other: There is a small fat containing umbilical hernia. Musculoskeletal: No acute or significant osseous findings.  The IMPRESSION: 1. Diffuse dilatation of the colon with air-fluid levels worrisome for colonic ileus the. Mild wall thickening of the transverse compatible with colitis cannot exclude pneumatosis in the ascending colon. Correlate clinically for ischemia. 2. No free intraperitoneal air. 3. Nonobstructing bilateral renal calculi. 4. Patchy ground-glass opacities in the bilateral lower lobes worrisome for infection or inflammation. 5. Aortic atherosclerosis. Aortic Atherosclerosis (ICD10-I70.0). Electronically Signed   By: Tyron Gallon M.D.   On: 02/23/2024 20:02    Scheduled Meds:  baclofen   10 mg Oral TID   Chlorhexidine  Gluconate Cloth  6 each Topical Daily   gabapentin   300 mg Oral BID    mupirocin  ointment  1 Application Nasal BID   mouth rinse  15 mL Mouth Rinse 4 times per day   sodium chloride  flush  3 mL Intravenous Q12H   Continuous Infusions:  sodium chloride      lactated ringers  150 mL/hr at 02/24/24 0865   piperacillin -tazobactam (ZOSYN )  IV 3.375 g (02/24/24 0608)   potassium chloride        LOS: 1 day   Time spent: 45 minutes  Unk Garb, DO  [facility]  02/24/2024, 11:37 AM

## 2024-02-24 NOTE — Assessment & Plan Note (Addendum)
 02-24-2024 stable.   02-25-2024 BP low. BP meds on hold.  02-26-2024 chronic. BP stable off HTN meds.  02-27-2024 BP stable. HR slightly elevated. Continue to monitor.  02-28-2024 according to med rec, pt not receiving any HTN meds at SNF.

## 2024-02-24 NOTE — Consult Note (Signed)
 WOC Nurse Consult Note: Reason for Consult: buttocks and R ankle wounds present on admission  Wound type: 1.  Deep Tissue Pressure Injury R ankle  2.  Deep Tissue Pressure Injury that has evolved to Stage 2 Pressure Injury L buttock, Deep purple maroon discoloration to R buttock with skin intact  Pressure Injury POA: Yes Measurement: see nursing flowsheet  Wound bed: dark purple discoloration to R ankle; L buttock Stage 2 PI red moist with surrounding dark skin; R buttock skin intact  Drainage (amount, consistency, odor) see nursing flowsheet  Periwound: intact  Dressing procedure/placement/frequency:  Cleanse R ankle with soap and water, dry and apply Xeroform gauze ([place] [phone]) to wound bed daily, cover with silicone foam or dry gauze and Kerlix roll gauze whichever is preferred.   Cleanse B buttocks with soap and water, dry and apply Xeroform gauze [employer] [phone]) to open areas and area of dark discoloration.  Cover with silicone foam or ABD pad and tape whichever is preferred.  POC discussed with bedside nurse. WOC team will not follow. Re-consult if further needs arise.   Thank you,    Ronni Colace MSN, RN-BC, [employer] [phone]

## 2024-02-24 NOTE — Progress Notes (Signed)
  Patient's RN reported that physical exam findings showed stage II of the left buttock and healing stage of stage II right ankle pressure ulcer. -Will consult wound care.

## 2024-02-24 NOTE — Plan of Care (Signed)

## 2024-02-24 NOTE — Progress Notes (Addendum)
 Subjective/Chief Complaint: Distended, some loose stool   Objective: Vital signs in last 24 hours: Temp:  [98.2 F (36.8 C)-98.3 F (36.8 C)] 98.2 F (36.8 C) (04/25 0442) Pulse Rate:  [83-104] 83 (04/25 0442) Resp:  [13-20] 16 (04/25 0442) BP: (99-129)/(69-91) 99/69 (04/25 0442) SpO2:  [89 %-99 %] 95 % (04/25 0442) Weight:  [60 kg] 60 kg (04/24 1612) Last BM Date :  (unknown)  Intake/Output from previous day: 04/24 0701 - 04/25 0700 In: 1955 [I.V.:944.4; IV Piggyback:1010.6] Out: 500 [Urine:500] Intake/Output this shift: No intake/output data recorded.  Ab soft mildly tender right side  Lab Results:  Recent Labs    02/23/24 1419 02/24/24 0706  WBC 15.9* 11.7*  HGB 14.7 13.0  HCT 43.6 37.9*  PLT 219 189   BMET Recent Labs    02/23/24 1419 02/24/24 0706  NA 139 140  K 3.0* 2.9*  CL 102 107  CO2 24 21*  GLUCOSE 98 97  BUN 9 9  CREATININE 1.32* 1.24  CALCIUM  8.8* 8.1*   PT/INR No results for input(s): "LABPROT", "INR" in the last 72 hours. ABG No results for input(s): "PHART", "HCO3" in the last 72 hours.  Invalid input(s): "PCO2", "PO2"  Studies/Results: CT ABDOMEN PELVIS W CONTRAST Result Date: 02/23/2024 CLINICAL DATA:  Acute abdominal pain. EXAM: CT ABDOMEN AND PELVIS WITH CONTRAST TECHNIQUE: Multidetector CT imaging of the abdomen and pelvis was performed using the standard protocol following bolus administration of intravenous contrast. RADIATION DOSE REDUCTION: This exam was performed according to the departmental dose-optimization program which includes automated exposure control, adjustment of the mA and/or kV according to patient size and/or use of iterative reconstruction technique. CONTRAST:  75mL OMNIPAQUE  IOHEXOL  350 MG/ML SOLN COMPARISON:  None Available. FINDINGS: Lower chest: There are patchy ground-glass opacities in the bilateral lower lobes. Hepatobiliary: No focal liver abnormality is seen. No gallstones, gallbladder wall thickening,  or biliary dilatation. Pancreas: Unremarkable. No pancreatic ductal dilatation or surrounding inflammatory changes. Spleen: Normal in size without focal abnormality. Adrenals/Urinary Tract: There a are punctate bilateral renal calculi measuring up to 4 mm. Otherwise, the kidneys, adrenal glands and bladder are within normal limits. Stomach/Bowel: The colon is diffusely dilated and contains air-fluid levels. There is some mild circumferential wall thickening of the anterior and peripheral bubbly lucencies are seen in the ascending colon which may be related to fecal content; however, pneumatosis would be difficult to exclude. There is no free intraperitoneal air. The appendix, small bowel, and stomach are within normal limits. Vascular/Lymphatic: Aortic atherosclerosis. No enlarged abdominal or pelvic lymph nodes. Reproductive: Prostate is unremarkable. Other: There is a small fat containing umbilical hernia. Musculoskeletal: No acute or significant osseous findings.  The IMPRESSION: 1. Diffuse dilatation of the colon with air-fluid levels worrisome for colonic ileus the. Mild wall thickening of the transverse compatible with colitis cannot exclude pneumatosis in the ascending colon. Correlate clinically for ischemia. 2. No free intraperitoneal air. 3. Nonobstructing bilateral renal calculi. 4. Patchy ground-glass opacities in the bilateral lower lobes worrisome for infection or inflammation. 5. Aortic atherosclerosis. Aortic Atherosclerosis (ICD10-I70.0). Electronically Signed   By: Tyron Gallon M.D.   On: 02/23/2024 20:02    Anti-infectives: Anti-infectives (From admission, onward)    Start     Dose/Rate Route Frequency Ordered Stop   02/24/24 0600  piperacillin -tazobactam (ZOSYN ) IVPB 3.375 g        3.375 g 12.5 mL/hr over 240 Minutes Intravenous Every 8 hours 02/23/24 2127     02/23/24 2015  piperacillin -tazobactam (ZOSYN )  IVPB 3.375 g        3.375 g 100 mL/hr over 30 Minutes Intravenous  Once  02/23/24 2010 02/23/24 2224       Assessment/Plan: Colonic ileus, possible pneumatosis -he is having bowel function but distended -continue bowel rest -I don't think needs surgery right now but question need for ng tube -will check ab film now -wbc was better this am, lactic acid normal -pharm dvt prophylaxis fine -we will follow  I reviewed hospitalist notes, last 24 h vitals and pain scores, last 24 h labs and trends, and last 24 h imaging results.   It looks like he has colonic psuedoobstruction and I think a GI consult would be indicated.    Enid Harry 02/24/2024

## 2024-02-24 NOTE — Assessment & Plan Note (Addendum)
 02-24-2024 continue with IV Kcl replacement.  02-25-2024 replete with IV Kcl and IV magnesium.  02-26-2024 continue with aggressive IV Kcl replacement. Will ask pharmacist to add electrolytes to TPN.  02-27-2024 pharmacy to adjust electrolytes in TPN.  02-28-2024 improved and stable after replacement.

## 2024-02-25 ENCOUNTER — Inpatient Hospital Stay (HOSPITAL_COMMUNITY)

## 2024-02-25 DIAGNOSIS — I10 Essential (primary) hypertension: Secondary | ICD-10-CM | POA: Diagnosis not present

## 2024-02-25 DIAGNOSIS — N179 Acute kidney failure, unspecified: Secondary | ICD-10-CM | POA: Diagnosis not present

## 2024-02-25 DIAGNOSIS — K5981 Ogilvie syndrome: Secondary | ICD-10-CM

## 2024-02-25 DIAGNOSIS — Z8673 Personal history of transient ischemic attack (TIA), and cerebral infarction without residual deficits: Secondary | ICD-10-CM | POA: Diagnosis not present

## 2024-02-25 LAB — BASIC METABOLIC PANEL WITH GFR
Anion gap: 13 (ref 5–15)
BUN: 10 mg/dL (ref 6–20)
CO2: 21 mmol/L — ABNORMAL LOW (ref 22–32)
Calcium: 8.4 mg/dL — ABNORMAL LOW (ref 8.9–10.3)
Chloride: 108 mmol/L (ref 98–111)
Creatinine, Ser: 1.4 mg/dL — ABNORMAL HIGH (ref 0.61–1.24)
GFR, Estimated: 58 mL/min — ABNORMAL LOW (ref >60)
Glucose, Bld: 74 mg/dL (ref 70–99)
Potassium: 2.6 mmol/L — CL (ref 3.5–5.1)
Sodium: 142 mmol/L (ref 135–145)

## 2024-02-25 LAB — MAGNESIUM: Magnesium: 1.7 mg/dL (ref 1.7–2.4)

## 2024-02-25 MED ORDER — MAGNESIUM SULFATE 2 GM/50ML IV SOLN
2.0000 g | Freq: Once | INTRAVENOUS | Status: AC
  Administered 2024-02-25: 2 g via INTRAVENOUS
  Filled 2024-02-25: qty 50

## 2024-02-25 MED ORDER — POTASSIUM CHLORIDE 10 MEQ/100ML IV SOLN
10.0000 meq | INTRAVENOUS | Status: AC
  Administered 2024-02-25 (×5): 10 meq via INTRAVENOUS
  Filled 2024-02-25 (×5): qty 100

## 2024-02-25 NOTE — Progress Notes (Signed)
 PROGRESS NOTE    Daniel Stuart  FAO:[id number] DOB: 1965/11/28 DOA: 02/23/2024 PCP: Paseda, Folashade R, [name]  Subjective: Pt seen and examined.  Pt seen by both GI and general surgery consults yesterday. RN having difficulty with bedside NG tube placement. Orders placed for NG tube placement in radiology.  K still low today.   Hospital Course: HPI: Daniel Stuart is a 58 y.o. 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  Reported patient did not have any bowel movement for 3 days and not eating well.  Denies any nausea and vomiting.  Patient stating that he has tried laxative without any improvement.  At the baseline patient is bedbound nonambulatory due to prior neurological deficit. Denies any fever chills, nausea, vomiting, and diarrhea.     ED Course:  At presentation to ED patient is hemodynamically stable CBC showing leukocytosis 15.9.  Normal RBC, hemoglobin and platelet count. CMP showing low potassium 3, elevated creatinine 1.3, low albumin 3.4.  Pending lactic acid level.   CT abdomen pelvis: 1. Diffuse dilatation of the colon with air-fluid levels worrisome for colonic ileus the. Mild wall thickening of the transverse compatible with colitis cannot exclude pneumatosis in the ascending colon. Correlate clinically for ischemia. 2. No free intraperitoneal air. 3. Nonobstructing bilateral renal calculi. 4. Patchy ground-glass opacities in the bilateral lower lobes worrisome for infection or inflammation. 5. Aortic  atherosclerosis.   Dr. Guss Legacy last consulted and spoke with general surgery Dr. Aldean Hummingbird.  Per general surgery in the setting of ileus recommended conservative management, trend lactic acid and there is no need for surgery to involved in this case. However if something emergently arises need to inform surgery to assess the patient.     Due to colitis in the ED patient has been started on Zosyn .  Due to hypokalemia repleted with IV KCl 40 mEq.  Lactic acid has been ordered.  Patient also received 500 mL of LR bolus.     Hospitalist has been consulted for further evaluation and management of ileus, colitis and AKI.  Significant Events: Admitted 02/23/2024 for ileus   Significant Labs: WBC 15.9, HgB 14.7, plt 219 Na 139, K 3.0, CO2 of 24, BUN 9, Scr 1.32, glu 98 UA negative C. Diff negative antigen, negative toxin  Significant Imaging Studies: CT abd/pelvis Diffuse dilatation of the colon with air-fluid levels worrisome for colonic ileus the. Mild wall thickening of the transverse compatible with colitis cannot exclude pneumatosis in the ascending colon. Correlate clinically for ischemia. 2. No free intraperitoneal air.  3. Nonobstructing bilateral renal calculi. 4. Patchy ground-glass opacities in the bilateral lower lobes worrisome for infection or  inflammation. 5. Aortic atherosclerosis.  Antibiotic Therapy: Anti-infectives (From admission, onward)    Start     Dose/Rate Route Frequency Ordered Stop   02/24/24 0600  piperacillin -tazobactam (ZOSYN ) IVPB 3.375 g        3.375 g 12.5 mL/hr over 240 Minutes Intravenous Every 8 hours 02/23/24 2127     02/23/24 2015  piperacillin -tazobactam (ZOSYN ) IVPB 3.375 g        3.375 g 100 mL/hr over 30 Minutes Intravenous  Once 02/23/24 2010 02/23/24 2224       Procedures:  Consultants:     Assessment and Plan: * Colonic pseudoobstruction 02-24-2024 pt does not have SBO. He has colonic ileus. Pt being followed by general surgery.  Remains on IVF.  02-25-2024 GI wants him to have NG for gastric decompression. RN unable to place at bedside. Will order NG tube by radiology.  Colitis 02-24-2024  continue with IVF, IV ABX. General surgery consulted. *update. Discussed with general surgery. No need for further IV ABX. Will stop IV Zosyn .  02-25-2024 discussed with surgery yesterday. No role for abx as surgery does not think he has colitis.  AKI (acute kidney injury) (HCC) 02-24-2024 resolved with IVF.  02-25-2024 continue with IVF.  Hypokalemia 02-24-2024 continue with IV Kcl replacement.  02-25-2024 replete with IV Kcl and IV magnesium.  Pressure injury of skin Pressure Injury 02/24/24 Buttocks Left Stage 2 -  Partial thickness loss of dermis presenting as a shallow open injury with a red, pink wound bed without slough. open and red (Active)  02/24/24 0000  Location: Buttocks  Location Orientation: Left  Staging: Stage 2 -  Partial thickness loss of dermis presenting as a shallow open injury with a red, pink wound bed without slough.  Wound Description (Comments): open and red  Present on Admission: Yes     Pressure Injury 02/24/24 Ankle Posterior;Right;Other (Comment) Stage 2 -  Partial thickness loss of dermis presenting as a shallow open injury with a red, pink wound bed without slough. black and appears healing pressure injury (Active)  02/24/24 0000  Location: Ankle  Location Orientation: Posterior;Right;Other (Comment)  Staging: Stage 2 -  Partial thickness loss of dermis presenting as a shallow open injury with a red, pink wound bed without slough.  Wound Description (Comments): black and appears healing pressure injury  Present on Admission: Yes      Essential hypertension 02-24-2024 stable.   02-25-2024 BP low. BP meds on hold.  History of intracranial hemorrhage 02-24-2024 chronic. Resolved. Not a candidate for systemic anticoagulation due to prior ICH while taking Eliquis.  History of CVA  (cerebrovascular accident) 02-24-2024 chronic. Resolved. Not a candidate for systemic anticoagulation due to prior ICH while taking Eliquis.  Hyperlipidemia 02-24-2024 po statin on hold for now.  Bedbound 02-25-2024 chronically bedbound at SNF due to multiple prior CVAs.  DVT prophylaxis: SCDs Start: 02/23/24 2056 Place TED hose Start: 02/23/24 2056    Code Status: Full Code Family Communication: no family at bedside Disposition Plan: return to SNF Reason for continuing need for hospitalization: remains on IVF. NG tube ordered for gastric decompression. Pt remains NPO.  Objective: Vitals:   02/24/24 1957 02/25/24 0016 02/25/24 0418 02/25/24 0803  BP: 112/74 99/75 103/70 105/77  Pulse: 86 85 78 93  Resp: 18 17 18 16   Temp: 98.8 F (37.1 C) 99.1 F (37.3 C) 99.4 F (37.4 C) 98.3 F (36.8 C)  TempSrc: Oral Oral Oral Oral  SpO2: 95% 96% 95% 92%  Weight:      Height:        Intake/Output Summary (Last 24 hours) at 02/25/2024 1011 Last data filed at 02/25/2024 0500 Gross per 24 hour  Intake 0 ml  Output 300 ml  Net -300 ml   Filed Weights   02/23/24 1612  Weight: 60 kg    Examination:  Physical Exam Vitals and nursing note reviewed.  Constitutional:      Comments: awake  HENT:     Head: Normocephalic and atraumatic.  Cardiovascular:     Rate and Rhythm: Normal rate and regular  rhythm.  Pulmonary:     Effort: Pulmonary effort is normal.     Breath sounds: Normal breath sounds.  Abdominal:     General: There is distension.     Comments: Tinkling BS in LLQ Abd distended, non-tender  Skin:    Capillary Refill: Capillary refill takes less than 2 seconds.  Neurological:     Comments: +dysarthria     Data Reviewed: I have personally reviewed following labs and imaging studies  CBC: Recent Labs  Lab 02/23/24 1419 02/24/24 0706  WBC 15.9* 11.7*  HGB 14.7 13.0  HCT 43.6 37.9*  MCV 85.2 83.1  PLT 219 189   Basic Metabolic Panel: Recent Labs  Lab  02/23/24 0055 02/23/24 1419 02/24/24 0706 02/25/24 0823  NA  --  139 140 142  K  --  3.0* 2.9* 2.6*  CL  --  102 107 108  CO2  --  24 21* 21*  GLUCOSE  --  98 97 74  BUN  --  9 9 10   CREATININE  --  1.32* 1.24 1.40*  CALCIUM   --  8.8* 8.1* 8.4*  MG 1.9  --   --  1.7   GFR: Estimated Creatinine Clearance: 48.2 mL/min (A) (by C-G formula based on SCr of 1.4 mg/dL (H)). Liver Function Tests: Recent Labs  Lab 02/23/24 1419 02/24/24 0706  AST 15 11*  ALT 13 11  ALKPHOS 88 63  BILITOT 1.0 0.9  PROT 7.6 5.9*  ALBUMIN 3.4* 2.6*   Sepsis Labs: Recent Labs  Lab 02/24/24 0038  LATICACIDVEN 1.6    Recent Results (from the past 240 hours)  Culture, blood (Routine X 2) w Reflex to ID Panel     Status: None (Preliminary result)   Collection Time: 02/24/24 12:38 AM   Specimen: BLOOD LEFT ARM  Result Value Ref Range Status   Specimen Description BLOOD LEFT ARM  Final   Special Requests   Final    BOTTLES DRAWN AEROBIC AND ANAEROBIC Blood Culture adequate volume   Culture   Final    NO GROWTH 1 DAY Performed at [facility], [address], [place], [place] [address]    Report Status PENDING  Incomplete  Culture, blood (Routine X 2) w Reflex to ID Panel     Status: None (Preliminary result)   Collection Time: 02/24/24 12:40 AM   Specimen: BLOOD LEFT HAND  Result Value Ref Range Status   Specimen Description BLOOD LEFT HAND  Final   Special Requests   Final    BOTTLES DRAWN AEROBIC AND ANAEROBIC Blood Culture adequate volume   Culture   Final    NO GROWTH 1 DAY Performed at [facility], [address], [place], [place] [address]    Report Status PENDING  Incomplete  C Difficile Quick Screen w PCR reflex     Status: None   Collection Time: 02/24/24  2:20 AM   Specimen: STOOL  Result Value Ref Range Status   C Diff antigen NEGATIVE NEGATIVE Final   C Diff toxin NEGATIVE NEGATIVE Final   C Diff interpretation No C. difficile detected.  Final    Comment:  Performed at [facility], [address], [place], [place] [address]  MRSA Next Gen by PCR, Nasal     Status: Abnormal   Collection Time: 02/24/24  4:45 AM   Specimen: Nasal Mucosa; Nasal Swab  Result Value Ref Range Status   MRSA by PCR Next Gen DETECTED (A) NOT  DETECTED Final    Comment: RESULT CALLED TO, READ BACK BY AND VERIFIED WITH: M YOUNG,RN@0656  02/24/24 MK (NOTE) The GeneXpert MRSA Assay (FDA approved for NASAL specimens only), is one component of a comprehensive MRSA colonization surveillance program. It is not intended to diagnose MRSA infection nor to guide or monitor treatment for MRSA infections. Test performance is not FDA approved in patients less than 8 years old. Performed at [facility], [address] [place], [place] [address]      Radiology Studies: DG Abd Portable 1V Result Date: 02/24/2024 CLINICAL DATA:  Ileus. EXAM: PORTABLE ABDOMEN - 1 VIEW COMPARISON:  February 23, 2024. FINDINGS: Stable diffuse colonic dilatation is noted most consistent with ileus. No definite small bowel dilatation is noted. Bilateral nephrolithiasis is again noted. IMPRESSION: Stable diffuse colonic dilatation most consistent with ileus. Electronically Signed   By: Rosalene Colon M.D.   On: 02/24/2024 14:42   CT ABDOMEN PELVIS W CONTRAST Result Date: 02/23/2024 CLINICAL DATA:  Acute abdominal pain. EXAM: CT ABDOMEN AND PELVIS WITH CONTRAST TECHNIQUE: Multidetector CT imaging of the abdomen and pelvis was performed using the standard protocol following bolus administration of intravenous contrast. RADIATION DOSE REDUCTION: This exam was performed according to the departmental dose-optimization program which includes automated exposure control, adjustment of the mA and/or kV according to patient size and/or use of iterative reconstruction technique. CONTRAST:  75mL OMNIPAQUE  IOHEXOL  350 MG/ML SOLN COMPARISON:  None Available. FINDINGS: Lower chest: There are patchy ground-glass  opacities in the bilateral lower lobes. Hepatobiliary: No focal liver abnormality is seen. No gallstones, gallbladder wall thickening, or biliary dilatation. Pancreas: Unremarkable. No pancreatic ductal dilatation or surrounding inflammatory changes. Spleen: Normal in size without focal abnormality. Adrenals/Urinary Tract: There a are punctate bilateral renal calculi measuring up to 4 mm. Otherwise, the kidneys, adrenal glands and bladder are within normal limits. Stomach/Bowel: The colon is diffusely dilated and contains air-fluid levels. There is some mild circumferential wall thickening of the anterior and peripheral bubbly lucencies are seen in the ascending colon which may be related to fecal content; however, pneumatosis would be difficult to exclude. There is no free intraperitoneal air. The appendix, small bowel, and stomach are within normal limits. Vascular/Lymphatic: Aortic atherosclerosis. No enlarged abdominal or pelvic lymph nodes. Reproductive: Prostate is unremarkable. Other: There is a small fat containing umbilical hernia. Musculoskeletal: No acute or significant osseous findings.  The IMPRESSION: 1. Diffuse dilatation of the colon with air-fluid levels worrisome for colonic ileus the. Mild wall thickening of the transverse compatible with colitis cannot exclude pneumatosis in the ascending colon. Correlate clinically for ischemia. 2. No free intraperitoneal air. 3. Nonobstructing bilateral renal calculi. 4. Patchy ground-glass opacities in the bilateral lower lobes worrisome for infection or inflammation. 5. Aortic atherosclerosis. Aortic Atherosclerosis (ICD10-I70.0). Electronically Signed   By: Tyron Gallon M.D.   On: 02/23/2024 20:02    Scheduled Meds:  baclofen   10 mg Oral TID   Chlorhexidine  Gluconate Cloth  6 each Topical Daily   gabapentin   300 mg Oral BID   mupirocin  ointment  1 Application Nasal BID   mouth rinse  15 mL Mouth Rinse 4 times per day   sodium chloride  flush  3 mL  Intravenous Q12H   Continuous Infusions:  lactated ringers  100 mL/hr at 02/25/24 0831   magnesium sulfate bolus IVPB     potassium chloride        LOS: 2 days   Time spent: 45 minutes  Unk Garb, DO  [facility]  02/25/2024, 10:11 AM

## 2024-02-25 NOTE — Progress Notes (Signed)
 16 FR NGT placed without difficulty.patient tolerated procedure well. KUB ordered by MD.

## 2024-02-25 NOTE — Progress Notes (Signed)
 Initial Nutrition Assessment  DOCUMENTATION CODES:   Not applicable   INTERVENTION:  Recommend TPN for nutrition support as he has been unable to eat x5 days and at nutrition risk Monitor for diet advancement Monitor magnesium, potassium, and phosphorus daily for at least 3 days, MD to replete as needed Add Thiamine  100 mg daily for 5 days   NUTRITION DIAGNOSIS:  Inadequate oral intake related to inability to eat as evidenced by NPO status.  GOAL:  Patient will meet greater than or equal to 90% of their needs  MONITOR:  Diet advancement, Skin, Labs  REASON FOR ASSESSMENT:   Consult Other (Comment), Assessment of nutrition requirement/status (ileus, colitis, AKI)  ASSESSMENT:   Pt is from [facility]. C/o abdominal distention and lack of BM x3 days. No oral intake in three days. Facility concerned for bowel obstruction and sent pt via [facility] for assessment. PMH significant for: ICH (06/2023), TIA/stroke/progressive leukoencephalopathy/chronic left ICA stenosis, cognitive impairment, HLD, PVD, HTN.  4/24 admitted 4/24 CT abd: diffuse dilation of colon w/ air-fluid levels worrisome for colonic ileus, mild wall thickening of transverse compatible w/ colitis, ischemia?; patchy ground-glass opacities in bilateral lower lobes worrisome for infection or inflammation 4/26 NGT placed for decompression  Surgery currently recommending conservative management of ileus and to trend lactic acid. No indication for surgery at this time.  At baseline patient is non-ambulatory and bed bound d/t prior neurologic deficit.  RD working remotely. Chart review performed to collect pertinent information. Pt has not consumed anything by mouth for three days prior to admission. He has been a total of 5 days without oral intake. Ileus not subsiding. Recommend TPN support soon given his nutrition risk and prolonged time without oral intake as well as skin integrity issues that are progressing. Spoke  with MD via secure chat, who is amicable to this.  Spoke with patient sister who endorses UBW around 129lbs for last year. He had no problems chewing or swallowing at baseline and was consuming >50% of his meals at baseline. Would be offered 2-3 options for his meals at ALF. No oral nutrition supplements were in place to augment intake. No significant N/V at baseline.    Admit/Current Weight: 60kg - ? accuracy, appears pulled forward from August 2024 weight collection  Weight history difficult to discern via chart review. Last weight collected in August 2024 and over one year ago prior to that. UBW does appear to range from 56-60kg at baseline. Some BLE edema documented, although not significant.   Large BM noted while in ED. He is now having some bowel function, however remains significantly distended. Sister is unofficial HCPOA and agreed to placement of NGT for decompression. NGT placed earlier today.  Drains/Lines: OGT (67cm - gastric) placed 4/26 UOP: x24 hours  He has required potassium and magnesium repletion. Trending lactic acid. At significant risk for refeeding. Recommend thiamine  and MVI be added to TPN, if/when initiated and regular monitoring of magnesium, phosphorus, and potassium w/ repletion, as indicated.  Meds: gabapentin   Drips: 2g Mg sulfate x1 10mEq KCl x5 Lactated ringers  @100ml /hr  Labs: Na+ 142 (wdl) K+ 3.0>2.9>2.6 (L) Mg 1.7 (wdl) WBC 15.9>11.7 (H) Lactic Acid 1.6 (wdl)   NUTRITION - FOCUSED PHYSICAL EXAM: Deferred until in-person assessment can be completed  Diet Order:   Diet Order             Diet NPO time specified Except for: Sips with Meds  Diet effective now  EDUCATION NEEDS:   Not appropriate for education at this time  Skin:  Skin Assessment: Skin Integrity Issues: Skin Integrity Issues:: Stage II Stage II: L buttocks  Last BM:  4/25 - type 7 x2  Height:  Ht Readings from Last 1 Encounters:  02/23/24 5\' 6"   (1.676 m)   Weight:  Wt Readings from Last 1 Encounters:  02/23/24 60 kg   Ideal Body Weight:     BMI:  Body mass index is 21.35 kg/m.  Estimated Nutritional Needs:   Kcal:  1600-1800kcal  Protein:  75-90g  Fluid:  >1.6L/day  Con Decant MS, RD, LDN Registered Dietitian Clinical Nutrition RD Inpatient Contact Info in Amion

## 2024-02-25 NOTE — Progress Notes (Signed)
 Subjective: No acute events.  Objective: Vital signs in last 24 hours: Temp:  [98.3 F (36.8 C)-99.4 F (37.4 C)] 98.3 F (36.8 C) (04/26 0803) Pulse Rate:  [78-93] 93 (04/26 0803) Resp:  [16-18] 16 (04/26 0803) BP: (99-112)/(66-77) 105/77 (04/26 0803) SpO2:  [88 %-96 %] 92 % (04/26 0803) Last BM Date : 02/24/24  Intake/Output from previous day: 04/25 0701 - 04/26 0700 In: 0  Out: 300 [Urine:300] Intake/Output this shift: No intake/output data recorded.  General appearance: alert and no distress GI: soft, distended, tympanic, + BS  Lab Results: Recent Labs    02/23/24 1419 02/24/24 0706  WBC 15.9* 11.7*  HGB 14.7 13.0  HCT 43.6 37.9*  PLT 219 189   BMET Recent Labs    02/23/24 1419 02/24/24 0706  NA 139 140  K 3.0* 2.9*  CL 102 107  CO2 24 21*  GLUCOSE 98 97  BUN 9 9  CREATININE 1.32* 1.24  CALCIUM  8.8* 8.1*   LFT Recent Labs    02/24/24 0706  PROT 5.9*  ALBUMIN 2.6*  AST 11*  ALT 11  ALKPHOS 63  BILITOT 0.9   PT/INR No results for input(s): "LABPROT", "INR" in the last 72 hours. Hepatitis Panel No results for input(s): "HEPBSAG", "HCVAB", "HEPAIGM", "HEPBIGM" in the last 72 hours. C-Diff Recent Labs    02/24/24 0220  CDIFFTOX NEGATIVE   Fecal Lactopherrin No results for input(s): "FECLLACTOFRN" in the last 72 hours.  Studies/Results: DG Abd Portable 1V Result Date: 02/24/2024 CLINICAL DATA:  Ileus. EXAM: PORTABLE ABDOMEN - 1 VIEW COMPARISON:  February 23, 2024. FINDINGS: Stable diffuse colonic dilatation is noted most consistent with ileus. No definite small bowel dilatation is noted. Bilateral nephrolithiasis is again noted. IMPRESSION: Stable diffuse colonic dilatation most consistent with ileus. Electronically Signed   By: Rosalene Colon M.D.   On: 02/24/2024 14:42   CT ABDOMEN PELVIS W CONTRAST Result Date: 02/23/2024 CLINICAL DATA:  Acute abdominal pain. EXAM: CT ABDOMEN AND PELVIS WITH CONTRAST TECHNIQUE: Multidetector CT imaging of  the abdomen and pelvis was performed using the standard protocol following bolus administration of intravenous contrast. RADIATION DOSE REDUCTION: This exam was performed according to the departmental dose-optimization program which includes automated exposure control, adjustment of the mA and/or kV according to patient size and/or use of iterative reconstruction technique. CONTRAST:  75mL OMNIPAQUE  IOHEXOL  350 MG/ML SOLN COMPARISON:  None Available. FINDINGS: Lower chest: There are patchy ground-glass opacities in the bilateral lower lobes. Hepatobiliary: No focal liver abnormality is seen. No gallstones, gallbladder wall thickening, or biliary dilatation. Pancreas: Unremarkable. No pancreatic ductal dilatation or surrounding inflammatory changes. Spleen: Normal in size without focal abnormality. Adrenals/Urinary Tract: There a are punctate bilateral renal calculi measuring up to 4 mm. Otherwise, the kidneys, adrenal glands and bladder are within normal limits. Stomach/Bowel: The colon is diffusely dilated and contains air-fluid levels. There is some mild circumferential wall thickening of the anterior and peripheral bubbly lucencies are seen in the ascending colon which may be related to fecal content; however, pneumatosis would be difficult to exclude. There is no free intraperitoneal air. The appendix, small bowel, and stomach are within normal limits. Vascular/Lymphatic: Aortic atherosclerosis. No enlarged abdominal or pelvic lymph nodes. Reproductive: Prostate is unremarkable. Other: There is a small fat containing umbilical hernia. Musculoskeletal: No acute or significant osseous findings.  The IMPRESSION: 1. Diffuse dilatation of the colon with air-fluid levels worrisome for colonic ileus the. Mild wall thickening of the transverse compatible with colitis cannot exclude pneumatosis  in the ascending colon. Correlate clinically for ischemia. 2. No free intraperitoneal air. 3. Nonobstructing bilateral renal  calculi. 4. Patchy ground-glass opacities in the bilateral lower lobes worrisome for infection or inflammation. 5. Aortic atherosclerosis. Aortic Atherosclerosis (ICD10-I70.0). Electronically Signed   By: Tyron Gallon M.D.   On: 02/23/2024 20:02    Medications: Scheduled:  baclofen   10 mg Oral TID   Chlorhexidine  Gluconate Cloth  6 each Topical Daily   gabapentin   300 mg Oral BID   mupirocin  ointment  1 Application Nasal BID   mouth rinse  15 mL Mouth Rinse 4 times per day   sodium chloride  flush  3 mL Intravenous Q12H   Continuous:  lactated ringers  100 mL/hr at 02/25/24 0831    Assessment/Plan: 1) Ileus - colonic 2) Hypokalemia at 2.9 (02/24/2024).   No NG tube was placed, however, there was approval for the tube, per sign out.  The order history  was reviewed and the NG tube was ordered.  The potassium is low, but no new values for today.  He is s/p IV infusion yesterday.  Plan: 1) Agree with NG tube to low intermittent suctioning. 2) Follow K+ and Mg+ and replete as necessary.  LOS: 2 days   Angelin Cutrone D 02/25/2024, 8:34 AM

## 2024-02-25 NOTE — Progress Notes (Signed)
 Critical Lab value Potassium 2.6, MD made aware.

## 2024-02-25 NOTE — Assessment & Plan Note (Signed)
 02-25-2024 chronically bedbound at SNF due to multiple prior CVAs.  02-27-2024 chronic.  02-28-2024 chronic.

## 2024-02-26 ENCOUNTER — Inpatient Hospital Stay (HOSPITAL_COMMUNITY)

## 2024-02-26 ENCOUNTER — Other Ambulatory Visit: Payer: Self-pay

## 2024-02-26 DIAGNOSIS — Z7401 Bed confinement status: Secondary | ICD-10-CM | POA: Diagnosis not present

## 2024-02-26 DIAGNOSIS — E876 Hypokalemia: Secondary | ICD-10-CM | POA: Diagnosis not present

## 2024-02-26 DIAGNOSIS — N179 Acute kidney failure, unspecified: Secondary | ICD-10-CM | POA: Diagnosis not present

## 2024-02-26 DIAGNOSIS — K5981 Ogilvie syndrome: Secondary | ICD-10-CM | POA: Diagnosis not present

## 2024-02-26 LAB — BASIC METABOLIC PANEL WITH GFR
Anion gap: 14 (ref 5–15)
BUN: 7 mg/dL (ref 6–20)
CO2: 19 mmol/L — ABNORMAL LOW (ref 22–32)
Calcium: 8 mg/dL — ABNORMAL LOW (ref 8.9–10.3)
Chloride: 110 mmol/L (ref 98–111)
Creatinine, Ser: 1.32 mg/dL — ABNORMAL HIGH (ref 0.61–1.24)
GFR, Estimated: >60 mL/min (ref >60)
Glucose, Bld: 67 mg/dL — ABNORMAL LOW (ref 70–99)
Potassium: 2.9 mmol/L — ABNORMAL LOW (ref 3.5–5.1)
Sodium: 143 mmol/L (ref 135–145)

## 2024-02-26 LAB — MAGNESIUM: Magnesium: 2.1 mg/dL (ref 1.7–2.4)

## 2024-02-26 LAB — GLUCOSE, CAPILLARY
Glucose-Capillary: 70 mg/dL (ref 70–99)
Glucose-Capillary: 97 mg/dL (ref 70–99)

## 2024-02-26 LAB — PHOSPHORUS: Phosphorus: 2.6 mg/dL (ref 2.5–4.6)

## 2024-02-26 LAB — TRIGLYCERIDES: Triglycerides: 91 mg/dL (ref <150)

## 2024-02-26 LAB — POTASSIUM: Potassium: 3.1 mmol/L — ABNORMAL LOW (ref 3.5–5.1)

## 2024-02-26 MED ORDER — TRAVASOL 10 % IV SOLN
INTRAVENOUS | Status: AC
  Filled 2024-02-26: qty 316.8

## 2024-02-26 MED ORDER — SODIUM CHLORIDE 0.9% FLUSH: 10.0000 mL | INTRAVENOUS | Status: DC | PRN

## 2024-02-26 MED ORDER — POTASSIUM CHLORIDE 10 MEQ/50ML IV SOLN
10.0000 meq | INTRAVENOUS | Status: AC
  Administered 2024-02-26 – 2024-02-27 (×6): 10 meq via INTRAVENOUS
  Filled 2024-02-26 (×6): qty 50

## 2024-02-26 MED ORDER — PANTOPRAZOLE SODIUM 40 MG IV SOLR
40.0000 mg | Freq: Two times a day (BID) | INTRAVENOUS | Status: DC
  Administered 2024-02-26 – 2024-02-28 (×5): 40 mg via INTRAVENOUS
  Filled 2024-02-26 (×5): qty 10

## 2024-02-26 MED ORDER — INSULIN ASPART 100 UNIT/ML IJ SOLN
0.0000 [IU] | Freq: Four times a day (QID) | INTRAMUSCULAR | Status: DC
  Administered 2024-02-27: 2 [IU] via SUBCUTANEOUS
  Administered 2024-02-28: 1 [IU] via SUBCUTANEOUS
  Administered 2024-02-28: 3 [IU] via SUBCUTANEOUS
  Administered 2024-02-29: 1 [IU] via SUBCUTANEOUS

## 2024-02-26 MED ORDER — POTASSIUM CHLORIDE 10 MEQ/100ML IV SOLN
10.0000 meq | INTRAVENOUS | Status: AC
Start: 2024-02-26 — End: 2024-02-26
  Administered 2024-02-26 (×4): 10 meq via INTRAVENOUS
  Filled 2024-02-26 (×4): qty 100

## 2024-02-26 NOTE — Progress Notes (Addendum)
  Pharmacy Electrolyte Replacement   Recent Labs:  K = 3.1   Low Critical Values (K </= 2.5, Phos </= 1, Mg </= 1) Present: None   MD Contacted: n/a   Plan: Supp K with K runs 10 meq x 6 doses. Repeat K with AM labs.   Joanell Mowers, Davey Erp, BCCP Clinical Pharmacist  02/26/2024       9:34 PM              [facility] pharmacy phone numbers are listed on [email or web address]

## 2024-02-26 NOTE — Plan of Care (Signed)

## 2024-02-26 NOTE — Progress Notes (Signed)
 PHARMACY - TOTAL PARENTERAL NUTRITION CONSULT NOTE   Indication:  colonic ileus  Patient Measurements: Height: 5\' 6"  (167.6 cm) Weight: 60 kg (132 lb 4.4 oz) IBW/kg (Calculated) : 63.8 TPN AdjBW (KG): 60 Body mass index is 21.35 kg/m. Usual Weight: 129 lb (58 kg) per sister  Assessment: 25 yom with cognitive impairment s/p ICH admitted from SNF with abdominal distension and lack of BM x3 days. Surgery suspected colonic ileus with possible pneumatosis and recommended conservative management with bowel rest. GI consulted and NGT placed on 4/26. Per RD note, patient has not consumed anything PO for ~5-6 days. He is having some bowel function but remains distended. Pharmacy consulted to begin TPN.  Glucose / Insulin: preDM, A1c 6.1 (06/2023) Electrolytes: K 2.9 s/p 50 mEq IV (goal >=4), Mg 2.1 s/p 2 g (goal >=2), Phos 2.6, other WNL Renal: SCr 1.32 (baseline ~1-1.1) Hepatic: LFTs, Alk Phos / Tbili / TG WNL, albumin 2.6 Intake / Output; MIVF: UOP 550 ml, LR 100 ml/hr, LBM 4/26 GI Imaging: 4/24 CT A/P: diffuse dilatation of the colon with air-fluid levels that is concerning for colonic ileus with mild wall thickening of the transverse colon compatible with colitis  4/25 Abd XR: stable diffuse colonic dilatation most consistent with ileus  4/26 Abd XR: slight decompression of dilated loops of small bowel  4/27 Abd XR: pending GI Surgeries / Procedures:   Central access: PICC to be placed 4/27 per IV team TPN start date: 4/27  Nutritional Goals: Goal TPN rate is 75 mL/hr (provides 79 g of protein and 1670 kcals per day)  RD Assessment: (4/26) Estimated Needs Total Energy Estimated Needs: 1600-1800kcal Total Protein Estimated Needs: 75-90g Total Fluid Estimated Needs: >1.6L/day  Current Nutrition:  NPO and TPN  Plan:  Start TPN at 9mL/hr at 1800 TPN provides 31 g of protein and 669 kcals per day meeting ~41% needs Electrolytes in TPN: Na 31mEq/L, increase K 75mEq/L (=54 mEq), Ca  69mEq/L, Mg 7mEq/L, and Phos 15mmol/L. Cl:Ac 1:1 Add standard MVI and trace elements to TPN Add thiamine  100 mg to TPN daily for 5 days (4/27 >> 5/1) Initiate Sensitive q6h SSI and adjust as needed  KCl 10 mEq IV q1h x4 ordered, check K this evening Monitor TPN labs on Mon/Thurs, daily until stable  Thank you for involving pharmacy in this patient's care.  Caroline Cinnamon, PharmD, BCPS Clinical Pharmacist Clinical phone for 02/26/2024 is [phone] 02/26/2024 9:10 AM

## 2024-02-26 NOTE — Progress Notes (Signed)
 PROGRESS NOTE    Daniel Stuart  WGN:[id number] DOB: Aug 08, 1966 DOA: 02/23/2024 PCP: Paseda, Folashade R, [name]  Subjective: Pt seen and examined.   K still low today despite aggressive IV Kcl replacement.  Had discussed with RD yesterday. Pt without significant nutrition for nearly 1 week. No evidence of systemic infection. Will place PICC and start TPN  KUB shows continue large bowel dilatation.  NG tube with old blood. Will start IV protonix .  Pt without any complaints of pain.   Hospital Course: HPI: Daniel Stuart is a 58 y.o. 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  Reported patient did not have any bowel movement for 3 days and not eating well.  Denies any nausea and vomiting.  Patient stating that he has tried laxative without any improvement.  At the baseline patient is bedbound nonambulatory due to prior neurological deficit. Denies any fever chills, nausea, vomiting, and diarrhea.     ED Course:  At presentation to ED patient is hemodynamically stable CBC showing leukocytosis 15.9.  Normal RBC, hemoglobin and platelet count. CMP showing low potassium 3, elevated creatinine 1.3, low albumin 3.4.  Pending lactic acid level.   CT abdomen pelvis: 1. Diffuse dilatation of the colon with air-fluid levels worrisome for colonic ileus the. Mild wall thickening of the transverse compatible with colitis cannot exclude pneumatosis in the ascending colon. Correlate clinically for ischemia. 2. No free intraperitoneal  air. 3. Nonobstructing bilateral renal calculi. 4. Patchy ground-glass opacities in the bilateral lower lobes worrisome for infection or inflammation. 5. Aortic atherosclerosis.   Dr. Guss Legacy last consulted and spoke with general surgery Dr. Aldean Hummingbird.  Per general surgery in the setting of ileus recommended conservative management, trend lactic acid and there is no need for surgery to involved in this case. However if something emergently arises need to inform surgery to assess the patient.     Due to colitis in the ED patient has been started on Zosyn .  Due to hypokalemia repleted with IV KCl 40 mEq.  Lactic acid has been ordered.  Patient also received 500 mL of LR bolus.     Hospitalist has been consulted for further evaluation and management of ileus, colitis and AKI.  Significant Events: Admitted 02/23/2024 for ileus   Significant Labs: WBC 15.9, HgB 14.7, plt 219 Na 139, K 3.0, CO2 of 24, BUN 9, Scr 1.32, glu 98 UA negative C. Diff negative antigen, negative toxin  Significant Imaging Studies: CT abd/pelvis Diffuse dilatation of the colon with air-fluid levels worrisome for colonic ileus the. Mild wall thickening of the transverse compatible with colitis cannot exclude pneumatosis in the ascending colon. Correlate clinically for ischemia. 2. No free intraperitoneal air.  3. Nonobstructing bilateral renal calculi. 4. Patchy ground-glass opacities in the bilateral lower lobes worrisome for infection or  inflammation. 5. Aortic atherosclerosis.  Antibiotic Therapy: Anti-infectives (From admission, onward)    Start     Dose/Rate Route Frequency Ordered Stop   02/24/24 0600  piperacillin -tazobactam (ZOSYN ) IVPB 3.375 g        3.375 g 12.5 mL/hr over 240 Minutes Intravenous Every 8 hours 02/23/24 2127     02/23/24 2015  piperacillin -tazobactam (ZOSYN ) IVPB 3.375 g  3.375 g 100 mL/hr over 30 Minutes Intravenous  Once 02/23/24 2010 02/23/24 2224        Procedures:   Consultants:     Assessment and Plan: * Colonic pseudoobstruction 02-24-2024 pt does not have SBO. He has colonic ileus. Pt being followed by general surgery. Remains on IVF.  02-25-2024 GI wants him to have NG for gastric decompression. RN unable to place at bedside. Will order NG tube by radiology.  02-26-2024 NG tube placed by daytime RN yesterday. On intermittent suction. NG tube with old blood. Will start IV protonix  40 mg bid.  Repeat KUB today shows continue large bowel dilatation. Not significantly changed on my read even after NG tube placement.  Had discussed with RD yesterday. Pt without significant nutrition for nearly 1 week. No evidence of systemic infection. Will place PICC and start TPN   Colitis 02-24-2024  continue with IVF, IV ABX. General surgery consulted. *update. Discussed with general surgery. No need for further IV ABX. Will stop IV Zosyn .  02-25-2024 discussed with surgery yesterday. No role for abx as surgery does not think he has colitis.  AKI (acute kidney injury) (HCC) 02-24-2024 resolved with IVF.  02-25-2024 continue with IVF.  02-26-2024 continue with IVF.  Hypokalemia 02-24-2024 continue with IV Kcl replacement.  02-25-2024 replete with IV Kcl and IV magnesium.  02-26-2024 continue with aggressive IV Kcl replacement. Will ask pharmacist to add electrolytes to TPN.  Pressure injury of skin Pressure Injury 02/24/24 Buttocks Left Stage 2 -  Partial thickness loss of dermis presenting as a shallow open injury with a red, pink wound bed without slough. open and red (Active)  02/24/24 0000  Location: Buttocks  Location Orientation: Left  Staging: Stage 2 -  Partial thickness loss of dermis presenting as a shallow open injury with a red, pink wound bed without slough.  Wound Description (Comments): open and red  Present on Admission: Yes     Pressure Injury 02/24/24 Ankle Posterior;Right;Other (Comment) Stage 2 -  Partial  thickness loss of dermis presenting as a shallow open injury with a red, pink wound bed without slough. black and appears healing pressure injury (Active)  02/24/24 0000  Location: Ankle  Location Orientation: Posterior;Right;Other (Comment)  Staging: Stage 2 -  Partial thickness loss of dermis presenting as a shallow open injury with a red, pink wound bed without slough.  Wound Description (Comments): black and appears healing pressure injury  Present on Admission: Yes      Essential hypertension 02-24-2024 stable.   02-25-2024 BP low. BP meds on hold.  02-26-2024 chronic. BP stable off HTN meds.  History of intracranial hemorrhage 02-24-2024 chronic. Resolved. Not a candidate for systemic anticoagulation due to prior ICH while taking Eliquis.  02-26-2024 chronic.   History of CVA (cerebrovascular accident) 02-24-2024 chronic. Resolved. Not a candidate for systemic anticoagulation due to prior ICH while taking Eliquis.  02-26-2024 chronic.  Hyperlipidemia 02-24-2024 po statin on hold for now.  02-26-2024 continue to hold statin due to NPO status.  Bedbound 02-25-2024 chronically bedbound at SNF due to multiple prior CVAs.   DVT prophylaxis: SCDs Start: 02/23/24 2056 Place TED hose Start: 02/23/24 2056    Code Status: Full Code Family Communication: no family at bedside Disposition Plan: return to SNF Reason for continuing need for hospitalization: remains NPO. NG tube to low wall intermittent suction. Starting TPN today. Colonic ileus not resolved.  Objective: Vitals:   02/25/24 1656 02/25/24 2006 02/26/24 0508 02/26/24 0835  BP: 108/75 118/76 113/79  116/72  Pulse: 90 93 83 93  Resp: 16 16 16 18   Temp: 98.2 F (36.8 C) 98.7 F (37.1 C) 98.9 F (37.2 C) 98 F (36.7 C)  TempSrc: Oral Oral Oral Oral  SpO2: 94% 100% 92% 91%  Weight:      Height:        Intake/Output Summary (Last 24 hours) at 02/26/2024 0850 Last data filed at 02/25/2024 2000 Gross per 24  hour  Intake 0 ml  Output 550 ml  Net -550 ml   Filed Weights   02/23/24 1612  Weight: 60 kg    Examination:  Physical Exam Vitals and nursing note reviewed.  Constitutional:      General: He is not in acute distress.    Appearance: He is not toxic-appearing.     Comments: Chronically ill appearing  HENT:     Head: Normocephalic and atraumatic.  Cardiovascular:     Rate and Rhythm: Normal rate and regular rhythm.  Pulmonary:     Effort: Pulmonary effort is normal. No respiratory distress.  Abdominal:     General: There is distension.     Tenderness: There is no abdominal tenderness.     Comments: Hypoactive BS. +NG tube in place  Musculoskeletal:     Right lower leg: No edema.     Left lower leg: No edema.  Skin:    General: Skin is warm and dry.     Capillary Refill: Capillary refill takes less than 2 seconds.  Neurological:     Mental Status: He is alert.     Data Reviewed: I have personally reviewed following labs and imaging studies  CBC: Recent Labs  Lab 02/23/24 1419 02/24/24 0706  WBC 15.9* 11.7*  HGB 14.7 13.0  HCT 43.6 37.9*  MCV 85.2 83.1  PLT 219 189   Basic Metabolic Panel: Recent Labs  Lab 02/23/24 0055 02/23/24 1419 02/24/24 0706 02/25/24 0823 02/26/24 0615  NA  --  139 140 142 143  K  --  3.0* 2.9* 2.6* 2.9*  CL  --  102 107 108 110  CO2  --  24 21* 21* 19*  GLUCOSE  --  98 97 74 67*  BUN  --  9 9 10 7   CREATININE  --  1.32* 1.24 1.40* 1.32*  CALCIUM   --  8.8* 8.1* 8.4* 8.0*  MG 1.9  --   --  1.7 2.1   GFR: Estimated Creatinine Clearance: 51.1 mL/min (A) (by C-G formula based on SCr of 1.32 mg/dL (H)). Liver Function Tests: Recent Labs  Lab 02/23/24 1419 02/24/24 0706  AST 15 11*  ALT 13 11  ALKPHOS 88 63  BILITOT 1.0 0.9  PROT 7.6 5.9*  ALBUMIN 3.4* 2.6*   Sepsis Labs: Recent Labs  Lab 02/24/24 0038  LATICACIDVEN 1.6    Recent Results (from the past 240 hours)  Culture, blood (Routine X 2) w Reflex to ID Panel      Status: None (Preliminary result)   Collection Time: 02/24/24 12:38 AM   Specimen: BLOOD LEFT ARM  Result Value Ref Range Status   Specimen Description BLOOD LEFT ARM  Final   Special Requests   Final    BOTTLES DRAWN AEROBIC AND ANAEROBIC Blood Culture adequate volume   Culture   Final    NO GROWTH 1 DAY Performed at [facility], [address], [place], [place] [address]    Report Status PENDING  Incomplete  Culture, blood (Routine X 2) w Reflex  to ID Panel     Status: None (Preliminary result)   Collection Time: 02/24/24 12:40 AM   Specimen: BLOOD LEFT HAND  Result Value Ref Range Status   Specimen Description BLOOD LEFT HAND  Final   Special Requests   Final    BOTTLES DRAWN AEROBIC AND ANAEROBIC Blood Culture adequate volume   Culture   Final    NO GROWTH 1 DAY Performed at [facility], [address] [place], [place] [address]    Report Status PENDING  Incomplete  C Difficile Quick Screen w PCR reflex     Status: None   Collection Time: 02/24/24  2:20 AM   Specimen: STOOL  Result Value Ref Range Status   C Diff antigen NEGATIVE NEGATIVE Final   C Diff toxin NEGATIVE NEGATIVE Final   C Diff interpretation No C. difficile detected.  Final    Comment: Performed at [facility], [address], [place], [place] [address]  MRSA Next Gen by PCR, Nasal     Status: Abnormal   Collection Time: 02/24/24  4:45 AM   Specimen: Nasal Mucosa; Nasal Swab  Result Value Ref Range Status   MRSA by PCR Next Gen DETECTED (A) NOT DETECTED Final    Comment: RESULT CALLED TO, READ BACK BY AND VERIFIED WITH: M YOUNG,RN@0656  02/24/24 MK (NOTE) The GeneXpert MRSA Assay (FDA approved for NASAL specimens only), is one component of a comprehensive MRSA colonization surveillance program. It is not intended to diagnose MRSA infection nor to guide or monitor treatment for MRSA infections. Test performance is not FDA approved in patients less than 59 years old. Performed at  [facility], [address] [place], [place] [address]      Radiology Studies: US  EKG SITE RITE Result Date: 02/26/2024 If Site Rite image not attached, placement could not be confirmed due to current cardiac rhythm.  DG Abd 1 View Result Date: 02/25/2024 CLINICAL DATA:  NG tube placement. EXAM: ABDOMEN - 1 VIEW COMPARISON:  One-view abdomen 02/24/2024 FINDINGS: Side port of the NG tube is in the fundus the stomach. Slight decompression of dilated loops of small bowel noted. Implanted loop recorder again noted. IMPRESSION: 1. Side port of the NG tube is in the fundus the stomach. 2. Slight decompression of dilated loops of small bowel. Electronically Signed   By: Audree Leas M.D.   On: 02/25/2024 11:46   DG Abd Portable 1V Result Date: 02/24/2024 CLINICAL DATA:  Ileus. EXAM: PORTABLE ABDOMEN - 1 VIEW COMPARISON:  February 23, 2024. FINDINGS: Stable diffuse colonic dilatation is noted most consistent with ileus. No definite small bowel dilatation is noted. Bilateral nephrolithiasis is again noted. IMPRESSION: Stable diffuse colonic dilatation most consistent with ileus. Electronically Signed   By: Rosalene Colon M.D.   On: 02/24/2024 14:42    Scheduled Meds:  baclofen   10 mg Oral TID   Chlorhexidine  Gluconate Cloth  6 each Topical Daily   gabapentin   300 mg Oral BID   mupirocin  ointment  1 Application Nasal BID   mouth rinse  15 mL Mouth Rinse 4 times per day   pantoprazole  (PROTONIX ) IV  40 mg Intravenous Q12H   sodium chloride  flush  3 mL Intravenous Q12H   Continuous Infusions:  lactated ringers  100 mL/hr at 02/25/24 2246   potassium chloride        LOS: 3 days   Time spent: 40 minutes  Unk Garb, DO  [facility]  02/26/2024, 8:50 AM

## 2024-02-26 NOTE — Progress Notes (Signed)
 Subjective: No acute events..  Objective: Vital signs in last 24 hours: Temp:  [98 F (36.7 C)-98.9 F (37.2 C)] 98 F (36.7 C) (04/27 0835) Pulse Rate:  [83-93] 93 (04/27 0835) Resp:  [16-18] 18 (04/27 0835) BP: (108-118)/(72-79) 116/72 (04/27 0835) SpO2:  [91 %-100 %] 91 % (04/27 0835) Last BM Date : 02/26/24  Intake/Output from previous day: 04/26 0701 - 04/27 0700 In: 0  Out: 550 [Urine:550] Intake/Output this shift: Total I/O In: -  Out: 550 [Urine:550]  General appearance: alert and no distress GI: distended, soft, tympanic, no bowel sounds  Lab Results: Recent Labs    02/24/24 0706  WBC 11.7*  HGB 13.0  HCT 37.9*  PLT 189   BMET Recent Labs    02/24/24 0706 02/25/24 0823 02/26/24 0615  NA 140 142 143  K 2.9* 2.6* 2.9*  CL 107 108 110  CO2 21* 21* 19*  GLUCOSE 97 74 67*  BUN 9 10 7   CREATININE 1.24 1.40* 1.32*  CALCIUM  8.1* 8.4* 8.0*   LFT Recent Labs    02/24/24 0706  PROT 5.9*  ALBUMIN 2.6*  AST 11*  ALT 11  ALKPHOS 63  BILITOT 0.9   PT/INR No results for input(s): "LABPROT", "INR" in the last 72 hours. Hepatitis Panel No results for input(s): "HEPBSAG", "HCVAB", "HEPAIGM", "HEPBIGM" in the last 72 hours. C-Diff Recent Labs    02/24/24 0220  CDIFFTOX NEGATIVE   Fecal Lactopherrin No results for input(s): "FECLLACTOFRN" in the last 72 hours.  Studies/Results: DG Abd 1 View Result Date: 02/26/2024 CLINICAL DATA:  Abdominal pain and possible constipation EXAM: ABDOMEN - 1 VIEW COMPARISON:  02/25/2024 FINDINGS: Scattered large and small bowel gas is noted. This may represent a colonic ileus. No significant small bowel dilatation is seen. Gastric catheter remains in the stomach. No free air is noted. IMPRESSION: Findings suspicious for colonic ileus. Electronically Signed   By: Violeta Grey M.D.   On: 02/26/2024 10:37   US  EKG SITE RITE Result Date: 02/26/2024 If Site Rite image not attached, placement could not be confirmed due to  current cardiac rhythm.  DG Abd 1 View Result Date: 02/25/2024 CLINICAL DATA:  NG tube placement. EXAM: ABDOMEN - 1 VIEW COMPARISON:  One-view abdomen 02/24/2024 FINDINGS: Side port of the NG tube is in the fundus the stomach. Slight decompression of dilated loops of small bowel noted. Implanted loop recorder again noted. IMPRESSION: 1. Side port of the NG tube is in the fundus the stomach. 2. Slight decompression of dilated loops of small bowel. Electronically Signed   By: Audree Leas M.D.   On: 02/25/2024 11:46    Medications: Scheduled:  baclofen   10 mg Oral TID   Chlorhexidine  Gluconate Cloth  6 each Topical Daily   gabapentin   300 mg Oral BID   insulin aspart  0-9 Units Subcutaneous Q6H   mupirocin  ointment  1 Application Nasal BID   mouth rinse  15 mL Mouth Rinse 4 times per day   pantoprazole  (PROTONIX ) IV  40 mg Intravenous Q12H   sodium chloride  flush  3 mL Intravenous Q12H   Continuous:  TPN ADULT (ION)      Assessment/Plan: 1) Colonic ileus. 2) Hypokalemia despite repletion. 3) Cognitive impairment. 4) Malnutrition.   The patient's abdominal examination does not reveal any bowel sounds.  He does not appear uncomfortable.  Review of his KUB does not show any significant improvement.  With his cognitive impairment it is difficult to know if he is spontaneously passing  any flatus.    Plan: 1) Continue to replete K+. 2) Turn from side to side every couple of hours, if possible. 3) Agree with TPN. 4) Maintain NG tube for now.  LOS: 3 days   Lundyn Coste D 02/26/2024, 2:46 PM

## 2024-02-26 NOTE — Progress Notes (Signed)
 Peripherally Inserted Central Catheter Placement  The IV Nurse has discussed with the patient and/or persons authorized to consent for the patient, the purpose of this procedure and the potential benefits and risks involved with this procedure.  The benefits include less needle sticks, lab draws from the catheter, and the patient may be discharged home with the catheter. Risks include, but not limited to, infection, bleeding, blood clot (thrombus formation), and puncture of an artery; nerve damage and irregular heartbeat and possibility to perform a PICC exchange if needed/ordered by physician.  Alternatives to this procedure were also discussed.  Bard Power PICC patient education guide, fact sheet on infection prevention and patient information card has been provided to patient /or left at bedside. Obtained telephone consent from sister.    PICC Placement Documentation  PICC Double Lumen 02/26/24 Left Basilic 41 cm 2 cm (Active)  Indication for Insertion or Continuance of Line Administration of hyperosmolar/irritating solutions (i.e. TPN, Vancomycin, etc.) 02/26/24 1635  Exposed Catheter (cm) 2 cm 02/26/24 1635  Site Assessment Clean, Dry, Intact 02/26/24 1635  Lumen #1 Status Flushed;Saline locked;Blood return noted 02/26/24 1635  Lumen #2 Status Flushed;Saline locked;Blood return noted 02/26/24 1635  Dressing Type Transparent;Securing device 02/26/24 1635  Dressing Status Antimicrobial disc/dressing in place;Clean, Dry, Intact 02/26/24 1635  Line Care Connections checked and tightened 02/26/24 1635  Line Adjustment (NICU/IV Team Only) No 02/26/24 1635  Dressing Intervention New dressing 02/26/24 1635  Dressing Change Due 03/04/24 02/26/24 1635       Lavonna Lampron Haywood Lisle 02/26/2024, 4:36 PM

## 2024-02-27 ENCOUNTER — Inpatient Hospital Stay (HOSPITAL_COMMUNITY)

## 2024-02-27 DIAGNOSIS — N179 Acute kidney failure, unspecified: Secondary | ICD-10-CM | POA: Diagnosis not present

## 2024-02-27 DIAGNOSIS — E876 Hypokalemia: Secondary | ICD-10-CM | POA: Diagnosis not present

## 2024-02-27 DIAGNOSIS — I10 Essential (primary) hypertension: Secondary | ICD-10-CM | POA: Diagnosis not present

## 2024-02-27 DIAGNOSIS — K5981 Ogilvie syndrome: Secondary | ICD-10-CM | POA: Diagnosis not present

## 2024-02-27 DIAGNOSIS — E46 Unspecified protein-calorie malnutrition: Secondary | ICD-10-CM | POA: Insufficient documentation

## 2024-02-27 DIAGNOSIS — R933 Abnormal findings on diagnostic imaging of other parts of digestive tract: Secondary | ICD-10-CM

## 2024-02-27 LAB — RENAL FUNCTION PANEL
Albumin: 2.8 g/dL — ABNORMAL LOW (ref 3.5–5.0)
Anion gap: 11 (ref 5–15)
BUN: 5 mg/dL — ABNORMAL LOW (ref 6–20)
CO2: 18 mmol/L — ABNORMAL LOW (ref 22–32)
Calcium: 8.4 mg/dL — ABNORMAL LOW (ref 8.9–10.3)
Chloride: 117 mmol/L — ABNORMAL HIGH (ref 98–111)
Creatinine, Ser: 1.16 mg/dL (ref 0.61–1.24)
GFR, Estimated: >60 mL/min (ref >60)
Glucose, Bld: 135 mg/dL — ABNORMAL HIGH (ref 70–99)
Phosphorus: 1.6 mg/dL — ABNORMAL LOW (ref 2.5–4.6)
Potassium: 3.5 mmol/L (ref 3.5–5.1)
Sodium: 146 mmol/L — ABNORMAL HIGH (ref 135–145)

## 2024-02-27 LAB — MAGNESIUM
Magnesium: 2 mg/dL (ref 1.7–2.4)
Magnesium: 2 mg/dL (ref 1.7–2.4)

## 2024-02-27 LAB — COMPREHENSIVE METABOLIC PANEL WITH GFR
ALT: 16 U/L (ref 0–44)
AST: 18 U/L (ref 15–41)
Albumin: 2.8 g/dL — ABNORMAL LOW (ref 3.5–5.0)
Alkaline Phosphatase: 66 U/L (ref 38–126)
Anion gap: 10 (ref 5–15)
BUN: 5 mg/dL — ABNORMAL LOW (ref 6–20)
CO2: 17 mmol/L — ABNORMAL LOW (ref 22–32)
Calcium: 8.4 mg/dL — ABNORMAL LOW (ref 8.9–10.3)
Chloride: 119 mmol/L — ABNORMAL HIGH (ref 98–111)
Creatinine, Ser: 1.21 mg/dL (ref 0.61–1.24)
GFR, Estimated: >60 mL/min (ref >60)
Glucose, Bld: 120 mg/dL — ABNORMAL HIGH (ref 70–99)
Potassium: 3.3 mmol/L — ABNORMAL LOW (ref 3.5–5.1)
Sodium: 146 mmol/L — ABNORMAL HIGH (ref 135–145)
Total Bilirubin: 1 mg/dL (ref 0.0–1.2)
Total Protein: 6.7 g/dL (ref 6.5–8.1)

## 2024-02-27 LAB — PHOSPHORUS: Phosphorus: <1 mg/dL — CL (ref 2.5–4.6)

## 2024-02-27 LAB — GLUCOSE, CAPILLARY
Glucose-Capillary: 113 mg/dL — ABNORMAL HIGH (ref 70–99)
Glucose-Capillary: 90 mg/dL (ref 70–99)
Glucose-Capillary: 154 mg/dL — ABNORMAL HIGH (ref 70–99)

## 2024-02-27 MED ORDER — POTASSIUM CHLORIDE 10 MEQ/50ML IV SOLN
10.0000 meq | INTRAVENOUS | Status: AC
  Administered 2024-02-27 – 2024-02-28 (×4): 10 meq via INTRAVENOUS
  Filled 2024-02-27 (×4): qty 50

## 2024-02-27 MED ORDER — POTASSIUM CHLORIDE 10 MEQ/50ML IV SOLN
10.0000 meq | INTRAVENOUS | Status: DC
  Filled 2024-02-27 (×4): qty 50

## 2024-02-27 MED ORDER — POLYETHYLENE GLYCOL 3350 17 G PO PACK
34.0000 g | PACK | Freq: Two times a day (BID) | ORAL | Status: DC
  Administered 2024-02-27: 34 g
  Filled 2024-02-27: qty 2

## 2024-02-27 MED ORDER — POLYETHYLENE GLYCOL 3350 17 G PO PACK
34.0000 g | PACK | Freq: Two times a day (BID) | ORAL | Status: DC
  Administered 2024-02-27 – 2024-02-28 (×3): 34 g via ORAL
  Filled 2024-02-27 (×3): qty 2

## 2024-02-27 MED ORDER — POTASSIUM PHOSPHATES 15 MMOLE/5ML IV SOLN
30.0000 mmol | Freq: Once | INTRAVENOUS | Status: AC
  Administered 2024-02-27: 30 mmol via INTRAVENOUS
  Filled 2024-02-27: qty 10

## 2024-02-27 MED ORDER — POTASSIUM CHLORIDE 10 MEQ/50ML IV SOLN
10.0000 meq | INTRAVENOUS | Status: AC
Start: 2024-02-27 — End: 2024-02-27
  Administered 2024-02-27 (×4): 10 meq via INTRAVENOUS
  Filled 2024-02-27 (×4): qty 50

## 2024-02-27 MED ORDER — MAGNESIUM FOR TPN: INJECTION | INTRAVENOUS | Status: DC

## 2024-02-27 MED ORDER — POTASSIUM & SODIUM PHOSPHATES 280-160-250 MG PO PACK: 1.0000 | PACK | Freq: Three times a day (TID) | ORAL | Status: DC

## 2024-02-27 MED ORDER — TRAVASOL 10 % IV SOLN
INTRAVENOUS | Status: AC
Start: 2024-02-27 — End: 2024-02-28
  Filled 2024-02-27: qty 316.8

## 2024-02-27 MED ORDER — POTASSIUM PHOSPHATES 15 MMOLE/5ML IV SOLN: 30.0000 mmol | Freq: Once | INTRAVENOUS | Status: DC

## 2024-02-27 MED ORDER — POTASSIUM CHLORIDE 10 MEQ/50ML IV SOLN
10.0000 meq | INTRAVENOUS | Status: AC
  Administered 2024-02-27: 10 meq via INTRAVENOUS
  Filled 2024-02-27 (×2): qty 50

## 2024-02-27 MED ORDER — POTASSIUM CHLORIDE 10 MEQ/50ML IV SOLN
10.0000 meq | Freq: Once | INTRAVENOUS | Status: AC
  Administered 2024-02-27: 10 meq via INTRAVENOUS
  Filled 2024-02-27: qty 50

## 2024-02-27 MED ORDER — TRAVASOL 10 % IV SOLN: INTRAVENOUS | Status: DC

## 2024-02-27 NOTE — Plan of Care (Signed)

## 2024-02-27 NOTE — Progress Notes (Signed)
 Hello, Pt R/arm is swollen and tender to touch. I was asking the nurse tech who had him yesturday stated and she stated it was not like that yesturday.   Notified Unk Garb, DO

## 2024-02-27 NOTE — Progress Notes (Signed)
  Pharmacy Electrolyte Replacement   Recent Labs:  K = 3.5; Phos = 1.6   Low Critical Values (K </= 2.5, Phos </= 1, Mg </= 1) Present: None   MD Contacted: n/a   Plan: Supp K with K runs 10 meq x 4 doses + 30 mmol K phos (contains 40 meq Kcl) Repeat lytes with AM labs.   Joanell Mowers, Davey Erp, BCCP Clinical Pharmacist  02/26/2024       9:34 PM              [facility] pharmacy phone numbers are listed on [email or web address]

## 2024-02-27 NOTE — Progress Notes (Signed)
 PHARMACY - TOTAL PARENTERAL NUTRITION CONSULT NOTE   Indication:  colonic ileus  Patient Measurements: Height: 5\' 6"  (167.6 cm) Weight: 60 kg (132 lb 4.4 oz) IBW/kg (Calculated) : 63.8 TPN AdjBW (KG): 60 Body mass index is 21.35 kg/m. Usual Weight: 129 lb (58 kg) per sister  Assessment: 12 yom with cognitive impairment s/p ICH admitted from SNF with abdominal distension and lack of BM x3 days. Surgery suspected colonic ileus with possible pneumatosis and recommended conservative management with bowel rest, signed off 4/28. GI consulted and NGT placed on 4/26. Per RD note, patient has not consumed anything PO for ~5-6 days. He is having some bowel function but remains distended. Pharmacy consulted to begin TPN.  Glucose / Insulin: preDM, A1c 6.1 (06/2023); BG < 120 Electrolytes: K 3.3 s/p 100 mEq IV (goal >= 4), Mg 2.0 (goal >= 2), Phos < 1.0, Cl 119, CO2 17 Renal: SCr 1.21 (baseline ~1-1.1), BUN 5 Hepatic: LFTs, Alk Phos / Tbili / TG WNL, albumin 2.8 Intake / Output; MIVF: UOP 1.7 mL/kg/h (2500 mL), LBM 4/28 4/28 Has had 48h NGT decompression with marginal improvement  GI Imaging: 4/24 CT A/P: diffuse dilatation of the colon with air-fluid levels that is concerning for colonic ileus with mild wall thickening of the transverse colon compatible with colitis  4/25 KUB: stable diffuse colonic dilatation most consistent with ileus  4/26 KUB: slight decompression of dilated loops of small bowel  4/27 KUB: colonic ileus 4/28 KUB:  GI Surgeries / Procedures:   Central access: PICC to be placed 4/27 per IV team TPN start date: 4/27  Nutritional Goals: Goal TPN rate is 75 mL/hr (provides 79 g of protein and 1670 kcals per day)  RD Assessment: (4/26) Estimated Needs Total Energy Estimated Needs: 1600-1800kcal Total Protein Estimated Needs: 75-90g Total Fluid Estimated Needs: >1.6L/day  Current Nutrition:  NPO and TPN  Plan:  Continue TPN at 30 mL/hr at 1800 (provides 31 g of protein  and 669 kcals per day meeting ~41% needs) Electrolytes in TPN: remove Na 4/28, K 75 mEq/L (= 54 mEq), Ca 5 mEq/L, Mg 5 mEq/L, and Phos 15 mmol/L. Max Acetate KPhos 30 mmol IV x1 and KCl 10 mEq IV x2 outside of TPN per overnight team Additional KCl 10 mEq IV x4 outside of TPN Add standard MVI, chromium, and trace elements to TPN Add thiamine  100 mg to TPN daily for 5 days (4/27 >> 5/1) Initiate Sensitive q6h SSI and adjust as needed  Monitor TPN labs on Mon/Thurs, daily until stable - check RFP & Mg 4/28 at 1800 given severe refeeding  Thank you for involving pharmacy in this patient's care.  Heddy Liverpool, PharmD PGY2 Critical Care Pharmacy Resident 02/27/2024 7:44 AM

## 2024-02-27 NOTE — Progress Notes (Signed)
 Daily Progress Note  DOA: 02/23/2024 Hospital Day: 5   Chief Complaint:  Colonic ileus  ASSESSMENT    58 y.o. year old bed bound male with multiple medical problems not limited to intracranial hemorrhage, antiphospholipid syndrome, cortical blindness, CVA, PVD, HTN, CKD 2 . Admitted 4/25 after presenting with constipation and colonic ileus. See GI consult on 4/25.   Colonic ileus Possible pneumatosis Mild thickening of transverse colon CT scan on admission showed diffuse dilatation of the colon with air-fluid levels concerning for colonic ileus with mild wall thickening of the transverse colon.  Today:  Having BMs ( had large liquid BM this am). Abdomen is soft but distended and tympanitic. Today's KUB is pending. Not sure what to make of the transverse colon thickening on CT scan. He wasn't having symptoms of colitis on admission but rather constipated. General Surgery signed off today.   Persistent  electrolyte abnormalities K+ better but still low at 3.3.  critically low Phos  Undergoing electrolyte replacement   Principal Problem:   Colonic pseudoobstruction Active Problems:   Hyperlipidemia   History of CVA (cerebrovascular accident)   Bedbound   Colitis   Hypokalemia   AKI (acute kidney injury) (HCC)   History of intracranial hemorrhage   Essential hypertension   Pressure injury of skin   Generalized abdominal pain   Protein calorie malnutrition (HCC)   PLAN   --Continue TNA  --Electrolyte replacement in progress.  --Turn patient side to side every couple of hours if possible ( discussed with RN) .  --Clamp NGT. There has been about 600 ml out today but looks like water ( has gotten ice chips).  --Awaiting today's KUB results and will repeat in am.  --Primary team has ordered trial of clear. If doesn't tolerate then can resume intermittent suction --Continue Miralax BID --If persistent colonic dilation then will consider neostigmine.    Subjective    No complaints. Sleepy. Doesn't engage in conversation  Objective   GI Studies:   C. difficile negative.   Recent Labs    02/25/24 0823 02/26/24 0615 02/26/24 2112 02/27/24 0438  NA 142 143  --  146*  K 2.6* 2.9* 3.1* 3.3*  CL 108 110  --  119*  CO2 21* 19*  --  17*  GLUCOSE 74 67*  --  120*  BUN 10 7  --  5*  CREATININE 1.40* 1.32*  --  1.21  CALCIUM  8.4* 8.0*  --  8.4*   Recent Labs    02/27/24 0438  PROT 6.7  ALBUMIN 2.8*  AST 18  ALT 16  ALKPHOS 66  BILITOT 1.0   Imaging:  DG CHEST PORT 1 VIEW CLINICAL DATA:  PICC line placement  EXAM: PORTABLE CHEST 1 VIEW  COMPARISON:  09/08/2022  FINDINGS: Left upper extremity PICC line tip seen within the superior cavoatrial junction. Nasogastric tube extends into the upper abdomen within the expected gastric cardia. Bibasilar atelectasis is present. Lung volumes are small but are symmetric. No pneumothorax or pleural effusion. Cardiac size is within normal limits when accounting for poor pulmonary insufflation. Implanted loop recorder noted. Pulmonary vascularity is normal. No acute.  IMPRESSION: 1. Left upper extremity PICC line tip within the superior cavoatrial junction. 2. Pulmonary hypoinflation.  Electronically Signed   By: Worthy Heads M.D.   On: 02/26/2024 19:26 DG Abd 1 View CLINICAL DATA:  Abdominal pain and possible constipation  EXAM: ABDOMEN - 1 VIEW  COMPARISON:  02/25/2024  FINDINGS: Scattered large and small  bowel gas is noted. This may represent a colonic ileus. No significant small bowel dilatation is seen. Gastric catheter remains in the stomach. No free air is noted.  IMPRESSION: Findings suspicious for colonic ileus.  Electronically Signed   By: Violeta Grey M.D.   On: 02/26/2024 10:37 US  EKG SITE RITE If Site Rite image not attached, placement could not be confirmed due to  current cardiac rhythm.     Scheduled inpatient medications:   baclofen   10 mg Oral TID    Chlorhexidine  Gluconate Cloth  6 each Topical Daily   gabapentin   300 mg Oral BID   insulin aspart  0-9 Units Subcutaneous Q6H   mupirocin  ointment  1 Application Nasal BID   mouth rinse  15 mL Mouth Rinse 4 times per day   pantoprazole  (PROTONIX ) IV  40 mg Intravenous Q12H   polyethylene glycol  34 g Oral BID   sodium chloride  flush  3 mL Intravenous Q12H   Continuous inpatient infusions:   potassium chloride  10 mEq (02/27/24 1402)   potassium PHOSPHATE IVPB (in mmol) 30 mmol (02/27/24 0947)   TPN ADULT (ION) 30 mL/hr at 02/26/24 2123   TPN ADULT (ION)     PRN inpatient medications: acetaminophen  **OR** acetaminophen , mouth rinse, prochlorperazine , sodium chloride  flush, sodium chloride  flush  Vital signs in last 24 hours: Temp:  [97.8 F (36.6 C)-100.4 F (38 C)] 97.8 F (36.6 C) (04/28 1235) Pulse Rate:  [101-116] 115 (04/28 1235) Resp:  [18-19] 18 (04/28 1235) BP: (108-149)/(58-82) 118/80 (04/28 1235) SpO2:  [90 %-95 %] 90 % (04/28 1235) Last BM Date :  (pt unable to state)  Intake/Output Summary (Last 24 hours) at 02/27/2024 1451 Last data filed at 02/27/2024 0600 Gross per 24 hour  Intake 917.81 ml  Output 1950 ml  Net -1032.19 ml    Intake/Output from previous day: 04/27 0701 - 04/28 0700 In: 917.8 [I.V.:256.8; IV Piggyback:661] Out: 2500 [Urine:2500] Intake/Output this shift: No intake/output data recorded.   Physical Exam:  General: Alert male in NAD Heart:  Regular rate and rhythm.  Pulmonary: Normal respiratory effort Abdomen: Soft, moderately distended, tympanitic, nontender. Hypoactive bowel sounds. Neurologic: Alert and oriented Psych: Pleasant. Cooperative     LOS: 4 days   Mai Schwalbe ,NP 02/27/2024, 2:51 PM

## 2024-02-27 NOTE — Assessment & Plan Note (Signed)
 Nutrition Status: Nutrition Problem: Inadequate oral intake Etiology: inability to eat Signs/Symptoms: NPO status Interventions: Refer to RD note for recommendations, TPN, MVI

## 2024-02-27 NOTE — Care Management Important Message (Signed)
 Important Message  Patient Details  Name: KACI LASSLEY MRN: [id number] Date of Birth: [birth year]   Important Message Given:  Yes - Medicare IM     Wynonia Hedges 02/27/2024, 3:41 PM

## 2024-02-27 NOTE — Progress Notes (Signed)
   Discussed case with pt's sister [name]. Updated on patient condition. Potential for DC back to SNF on Wednesday.  Unk Garb, DO [facility]

## 2024-02-27 NOTE — Progress Notes (Signed)
  X-cover Note: notified by nurse that patient has a area of erythema near the right arm, peripheral iv access. Obtain upper extremity ultrasound throughout DVT.   Unk Garb, DO [facility]

## 2024-02-27 NOTE — Progress Notes (Addendum)
 PROGRESS NOTE    Daniel Stuart  WUJ:[id number] DOB: 12-23-1965 DOA: 02/23/2024 PCP: Paseda, Folashade R, [name]  Subjective: Pt seen and examined.  Started on TPN yesterday. K has improved some. Slightly hypernatremia today. Abd is marginally less distended with NG tube to intermittent suction. Pt wants something to drink.  Otherwise no complaints of pain.  Large liquid BM recorded this AM.   Hospital Course: HPI: Daniel Stuart is a 58 y.o. 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  Reported patient did not have any bowel movement for 3 days and not eating well.  Denies any nausea and vomiting.  Patient stating that he has tried laxative without any improvement.  At the baseline patient is bedbound nonambulatory due to prior neurological deficit. Denies any fever chills, nausea, vomiting, and diarrhea.     ED Course:  At presentation to ED patient is hemodynamically stable CBC showing leukocytosis 15.9.  Normal RBC, hemoglobin and platelet count. CMP showing low potassium 3, elevated creatinine 1.3, low albumin 3.4.  Pending lactic acid level.   CT abdomen pelvis: 1. Diffuse dilatation of the colon with air-fluid levels worrisome for colonic ileus the. Mild wall thickening of the transverse compatible with colitis cannot exclude pneumatosis in the ascending colon. Correlate clinically for ischemia. 2. No free intraperitoneal air. 3. Nonobstructing bilateral renal calculi. 4. Patchy ground-glass opacities in the bilateral  lower lobes worrisome for infection or inflammation. 5. Aortic atherosclerosis.   Dr. Guss Legacy last consulted and spoke with general surgery Dr. Aldean Hummingbird.  Per general surgery in the setting of ileus recommended conservative management, trend lactic acid and there is no need for surgery to involved in this case. However if something emergently arises need to inform surgery to assess the patient.     Due to colitis in the ED patient has been started on Zosyn .  Due to hypokalemia repleted with IV KCl 40 mEq.  Lactic acid has been ordered.  Patient also received 500 mL of LR bolus.     Hospitalist has been consulted for further evaluation and management of ileus, colitis and AKI.  Significant Events: Admitted 02/23/2024 for ileus   Significant Labs: WBC 15.9, HgB 14.7, plt 219 Na 139, K 3.0, CO2 of 24, BUN 9, Scr 1.32, glu 98 UA negative C. Diff negative antigen, negative toxin  Significant Imaging Studies: CT abd/pelvis Diffuse dilatation of the colon with air-fluid levels worrisome for colonic ileus the. Mild wall thickening of the transverse compatible with colitis cannot exclude pneumatosis in the ascending colon. Correlate clinically for ischemia. 2. No free intraperitoneal air.  3. Nonobstructing bilateral renal calculi. 4. Patchy ground-glass opacities in the bilateral lower lobes worrisome for infection or  inflammation. 5. Aortic atherosclerosis.  Antibiotic Therapy: Anti-infectives (From admission, onward)    Start     Dose/Rate Route Frequency Ordered Stop   02/24/24 0600  piperacillin -tazobactam (ZOSYN ) IVPB 3.375 g        3.375 g 12.5 mL/hr over 240 Minutes Intravenous Every 8 hours 02/23/24 2127     02/23/24 2015  piperacillin -tazobactam (ZOSYN ) IVPB 3.375 g        3.375 g 100 mL/hr over 30 Minutes Intravenous  Once 02/23/24 2010 02/23/24 2224  Procedures:   Consultants:     Assessment and Plan: * Colonic pseudoobstruction 02-24-2024 pt does not have  SBO. He has colonic ileus. Pt being followed by general surgery. Remains on IVF.  02-25-2024 GI wants him to have NG for gastric decompression. RN unable to place at bedside. Will order NG tube by radiology.  02-26-2024 NG tube placed by daytime RN yesterday. On intermittent suction. NG tube with old blood. Will start IV protonix  40 mg bid.  Repeat KUB today shows continue large bowel dilatation. Not significantly changed on my read even after NG tube placement.  Had discussed with RD yesterday. Pt without significant nutrition for nearly 1 week. No evidence of systemic infection. Will place PICC and start TPN   02-27-2024 awaiting Gi to f/u on patient. Has had over 48 hours of NG tube decompression with only marginal improvement in colonic distension/ileus. Remains on TPN for nutrition.  Pt had large type 7 liquid BM today. Will repeat KUB.  Colitis 02-24-2024  continue with IVF, IV ABX. General surgery consulted. *update. Discussed with general surgery. No need for further IV ABX. Will stop IV Zosyn .  02-25-2024 discussed with surgery yesterday. No role for abx as surgery does not think he has colitis.  AKI (acute kidney injury) (HCC) 02-24-2024 resolved with IVF.  02-25-2024 continue with IVF.  02-26-2024 continue with IVF.  02-27-2024 improved with TPN.  Hypokalemia 02-24-2024 continue with IV Kcl replacement.  02-25-2024 replete with IV Kcl and IV magnesium.  02-26-2024 continue with aggressive IV Kcl replacement. Will ask pharmacist to add electrolytes to TPN.  02-27-2024 pharmacy to adjust electrolytes in TPN.  Pressure injury of skin Pressure Injury 02/24/24 Buttocks Left Stage 2 -  Partial thickness loss of dermis presenting as a shallow open injury with a red, pink wound bed without slough. open and red (Active)  02/24/24 0000  Location: Buttocks  Location Orientation: Left  Staging: Stage 2 -  Partial thickness loss of dermis presenting as a shallow open injury with a  red, pink wound bed without slough.  Wound Description (Comments): open and red  Present on Admission: Yes     Pressure Injury 02/24/24 Ankle Posterior;Right;Other (Comment) Stage 2 -  Partial thickness loss of dermis presenting as a shallow open injury with a red, pink wound bed without slough. black and appears healing pressure injury (Active)  02/24/24 0000  Location: Ankle  Location Orientation: Posterior;Right;Other (Comment)  Staging: Stage 2 -  Partial thickness loss of dermis presenting as a shallow open injury with a red, pink wound bed without slough.  Wound Description (Comments): black and appears healing pressure injury  Present on Admission: Yes      Essential hypertension 02-24-2024 stable.   02-25-2024 BP low. BP meds on hold.  02-26-2024 chronic. BP stable off HTN meds.  02-27-2024 BP stable. HR slightly elevated. Continue to monitor.  History of intracranial hemorrhage 02-24-2024 chronic. Resolved. Not a candidate for systemic anticoagulation due to prior ICH while taking Eliquis.  02-26-2024 chronic.   02-27-2024 chronic.  History of CVA (cerebrovascular accident) 02-24-2024 chronic. Resolved. Not a candidate for systemic anticoagulation due to prior ICH while taking Eliquis.  02-26-2024 chronic.  02-27-2024 chronic.  Hyperlipidemia 02-24-2024 po statin on hold for now.  02-26-2024 continue to hold statin due to NPO status.  02-27-2024 continue to hold statin. NPO.  Protein calorie malnutrition (HCC) Nutrition Status: Nutrition Problem: Inadequate oral intake Etiology: inability to eat Signs/Symptoms: NPO status Interventions: Refer to RD note for recommendations, TPN,  MVI    Bedbound 02-25-2024 chronically bedbound at SNF due to multiple prior CVAs.  02-27-2024 chronic.   DVT prophylaxis: Place and maintain sequential compression device Start: 02/26/24 1011 SCDs Start: 02/23/24 2056 Place TED hose Start: 02/23/24 2056    Code Status:  Full Code Family Communication: no family at bedside Disposition Plan: return to SNF Reason for continuing need for hospitalization:  remains NPO. Had NG to low intermittent suction. On TPN.  Objective: Vitals:   02/26/24 2043 02/26/24 2346 02/27/24 0414 02/27/24 0808  BP: 108/82 (!) 119/58 120/78 117/73  Pulse: (!) 110 (!) 115 (!) 116 (!) 101  Resp: 18 18 18 18   Temp: 98.2 F (36.8 C) (!) 100.4 F (38 C) 98.8 F (37.1 C) 98.3 F (36.8 C)  TempSrc: Oral Oral Oral   SpO2: 93% 91% 92% 95%  Weight:      Height:        Intake/Output Summary (Last 24 hours) at 02/27/2024 1123 Last data filed at 02/27/2024 0600 Gross per 24 hour  Intake 917.81 ml  Output 1950 ml  Net -1032.19 ml   Filed Weights   02/23/24 1612  Weight: 60 kg    Examination:  Physical Exam Vitals and nursing note reviewed.  Constitutional:      General: He is not in acute distress.    Appearance: He is not toxic-appearing or diaphoretic.     Comments: Chronically ill  HENT:     Head: Normocephalic and atraumatic.     Nose:     Comments: +NG tube. Old blood in NG tubing Eyes:     General: No scleral icterus. Cardiovascular:     Rate and Rhythm: Normal rate and regular rhythm.  Pulmonary:     Effort: Pulmonary effort is normal.     Breath sounds: Normal breath sounds.  Abdominal:     General: There is distension.     Comments: Marginally softer today Hypoactive BS.  Skin:    General: Skin is warm and dry.     Capillary Refill: Capillary refill takes less than 2 seconds.  Neurological:     Mental Status: He is alert.     Data Reviewed: I have personally reviewed following labs and imaging studies  CBC: Recent Labs  Lab 02/23/24 1419 02/24/24 0706  WBC 15.9* 11.7*  HGB 14.7 13.0  HCT 43.6 37.9*  MCV 85.2 83.1  PLT 219 189   Basic Metabolic Panel: Recent Labs  Lab 02/23/24 0055 02/23/24 1419 02/23/24 1419 02/24/24 0706 02/25/24 0823 02/26/24 0615 02/26/24 2112 02/27/24 0438   NA  --  139  --  140 142 143  --  146*  K  --  3.0*   < > 2.9* 2.6* 2.9* 3.1* 3.3*  CL  --  102  --  107 108 110  --  119*  CO2  --  24  --  21* 21* 19*  --  17*  GLUCOSE  --  98  --  97 74 67*  --  120*  BUN  --  9  --  9 10 7   --  5*  CREATININE  --  1.32*  --  1.24 1.40* 1.32*  --  1.21  CALCIUM   --  8.8*  --  8.1* 8.4* 8.0*  --  8.4*  MG 1.9  --   --   --  1.7 2.1  --  2.0  PHOS  --   --   --   --   --  2.6  --  <1.0*   < > = values in this interval not displayed.   GFR: Estimated Creatinine Clearance: 55.8 mL/min (by C-G formula based on SCr of 1.21 mg/dL). Liver Function Tests: Recent Labs  Lab 02/23/24 1419 02/24/24 0706 02/27/24 0438  AST 15 11* 18  ALT 13 11 16   ALKPHOS 88 63 66  BILITOT 1.0 0.9 1.0  PROT 7.6 5.9* 6.7  ALBUMIN 3.4* 2.6* 2.8*   CBG: Recent Labs  Lab 02/26/24 1906 02/26/24 2348 02/27/24 0414  GLUCAP 70 97 113*   Lipid Profile: Recent Labs    02/26/24 0615  TRIG 91   Sepsis Labs: Recent Labs  Lab 02/24/24 0038  LATICACIDVEN 1.6    Recent Results (from the past 240 hours)  Culture, blood (Routine X 2) w Reflex to ID Panel     Status: None (Preliminary result)   Collection Time: 02/24/24 12:38 AM   Specimen: BLOOD LEFT ARM  Result Value Ref Range Status   Specimen Description BLOOD LEFT ARM  Final   Special Requests   Final    BOTTLES DRAWN AEROBIC AND ANAEROBIC Blood Culture adequate volume   Culture   Final    NO GROWTH 3 DAYS Performed at [facility], [address], [place], [place] [address]    Report Status PENDING  Incomplete  Culture, blood (Routine X 2) w Reflex to ID Panel     Status: None (Preliminary result)   Collection Time: 02/24/24 12:40 AM   Specimen: BLOOD LEFT HAND  Result Value Ref Range Status   Specimen Description BLOOD LEFT HAND  Final   Special Requests   Final    BOTTLES DRAWN AEROBIC AND ANAEROBIC Blood Culture adequate volume   Culture   Final    NO GROWTH 3 DAYS Performed at [facility], [address] [place], [place] [address]    Report Status PENDING  Incomplete  C Difficile Quick Screen w PCR reflex     Status: None   Collection Time: 02/24/24  2:20 AM   Specimen: STOOL  Result Value Ref Range Status   C Diff antigen NEGATIVE NEGATIVE Final   C Diff toxin NEGATIVE NEGATIVE Final   C Diff interpretation No C. difficile detected.  Final    Comment: Performed at [facility], [address], [place], [place] [address]  MRSA Next Gen by PCR, Nasal     Status: Abnormal   Collection Time: 02/24/24  4:45 AM   Specimen: Nasal Mucosa; Nasal Swab  Result Value Ref Range Status   MRSA by PCR Next Gen DETECTED (A) NOT DETECTED Final    Comment: RESULT CALLED TO, READ BACK BY AND VERIFIED WITH: M YOUNG,RN@0656  02/24/24 MK (NOTE) The GeneXpert MRSA Assay (FDA approved for NASAL specimens only), is one component of a comprehensive MRSA colonization surveillance program. It is not intended to diagnose MRSA infection nor to guide or monitor treatment for MRSA infections. Test performance is not FDA approved in patients less than 64 years old. Performed at [facility], [address] [place], [place] [address]      Radiology Studies: DG CHEST PORT 1 VIEW Result Date: 02/26/2024 CLINICAL DATA:  PICC line placement EXAM: PORTABLE CHEST 1 VIEW COMPARISON:  09/08/2022 FINDINGS: Left upper extremity PICC line tip seen within the superior cavoatrial junction. Nasogastric tube extends into the upper abdomen within the expected gastric cardia. Bibasilar atelectasis is present. Lung volumes are small but are symmetric.  No pneumothorax or pleural effusion. Cardiac size is within normal limits when accounting for poor pulmonary insufflation. Implanted loop recorder noted. Pulmonary vascularity is normal. No acute. IMPRESSION: 1. Left upper extremity PICC line tip within the superior cavoatrial junction. 2. Pulmonary hypoinflation. Electronically Signed   By: Worthy Heads M.D.   On: 02/26/2024 19:26   DG Abd 1 View Result Date: 02/26/2024 CLINICAL DATA:  Abdominal pain and possible constipation EXAM: ABDOMEN - 1 VIEW COMPARISON:  02/25/2024 FINDINGS: Scattered large and small bowel gas is noted. This may represent a colonic ileus. No significant small bowel dilatation is seen. Gastric catheter remains in the stomach. No free air is noted. IMPRESSION: Findings suspicious for colonic ileus. Electronically Signed   By: Violeta Grey M.D.   On: 02/26/2024 10:37   US  EKG SITE RITE Result Date: 02/26/2024 If Site Rite image not attached, placement could not be confirmed due to current cardiac rhythm.   Scheduled Meds:  baclofen   10 mg Oral TID   Chlorhexidine  Gluconate Cloth  6 each Topical Daily   gabapentin   300 mg Oral BID   insulin aspart  0-9 Units Subcutaneous Q6H   mupirocin  ointment  1 Application Nasal BID   mouth rinse  15 mL Mouth Rinse 4 times per day   pantoprazole  (PROTONIX ) IV  40 mg Intravenous Q12H   sodium chloride  flush  3 mL Intravenous Q12H   Continuous Infusions:  potassium chloride  10 mEq (02/27/24 1114)   potassium chloride      potassium PHOSPHATE IVPB (in mmol) 30 mmol (02/27/24 0947)   TPN ADULT (ION) 30 mL/hr at 02/26/24 2123   TPN ADULT (ION)       LOS: 4 days   Time spent: 45 minutes  Unk Garb, DO  [facility]  02/27/2024, 11:23 AM

## 2024-02-27 NOTE — Progress Notes (Signed)
   Abd xrays improvement in colonic gas. Will change NG tube to gravity and allow pt to have clear liquids. Repeat abd xray in AM and potentially remove NG tube and allow solid food tomorrow.  Unk Garb, DO [facility]

## 2024-02-27 NOTE — Progress Notes (Signed)
  Pharmacy Electrolyte Replacement   Recent Labs:  K = 3.3   Low Critical Values (K </= 2.5, Phos </= 1, Mg </= 1) Present:  Phos <1   MD Contacted: n/a   Plan:  Potassium Phosphate 30 mmol IV x 1 KCL 10meq IV Q1H x 2 doses   Silvestre Drum, PharmD, BCPS Clinical Pharmacist Phone: [phone]

## 2024-02-27 NOTE — Progress Notes (Signed)
 Subjective/Chief Complaint: States he has bowel functoin   Objective: Vital signs in last 24 hours: Temp:  [98.2 F (36.8 C)-100.4 F (38 C)] 98.3 F (36.8 C) (04/28 0808) Pulse Rate:  [101-116] 101 (04/28 0808) Resp:  [18-19] 18 (04/28 0808) BP: (108-149)/(58-82) 117/73 (04/28 0808) SpO2:  [91 %-95 %] 95 % (04/28 0808) Last BM Date : 02/26/24  Intake/Output from previous day: 04/27 0701 - 04/28 0700 In: 917.8 [I.V.:256.8; IV Piggyback:661] Out: 2500 [Urine:2500] Intake/Output this shift: No intake/output data recorded.  Ab much softer, nontender  Lab Results:  No results for input(s): "WBC", "HGB", "HCT", "PLT" in the last 72 hours. BMET Recent Labs    02/26/24 0615 02/26/24 2112 02/27/24 0438  NA 143  --  146*  K 2.9* 3.1* 3.3*  CL 110  --  119*  CO2 19*  --  17*  GLUCOSE 67*  --  120*  BUN 7  --  5*  CREATININE 1.32*  --  1.21  CALCIUM  8.0*  --  8.4*   PT/INR No results for input(s): "LABPROT", "INR" in the last 72 hours. ABG No results for input(s): "PHART", "HCO3" in the last 72 hours.  Invalid input(s): "PCO2", "PO2"  Studies/Results: DG CHEST PORT 1 VIEW Result Date: 02/26/2024 CLINICAL DATA:  PICC line placement EXAM: PORTABLE CHEST 1 VIEW COMPARISON:  09/08/2022 FINDINGS: Left upper extremity PICC line tip seen within the superior cavoatrial junction. Nasogastric tube extends into the upper abdomen within the expected gastric cardia. Bibasilar atelectasis is present. Lung volumes are small but are symmetric. No pneumothorax or pleural effusion. Cardiac size is within normal limits when accounting for poor pulmonary insufflation. Implanted loop recorder noted. Pulmonary vascularity is normal. No acute. IMPRESSION: 1. Left upper extremity PICC line tip within the superior cavoatrial junction. 2. Pulmonary hypoinflation. Electronically Signed   By: Worthy Heads M.D.   On: 02/26/2024 19:26   DG Abd 1 View Result Date: 02/26/2024 CLINICAL DATA:   Abdominal pain and possible constipation EXAM: ABDOMEN - 1 VIEW COMPARISON:  02/25/2024 FINDINGS: Scattered large and small bowel gas is noted. This may represent a colonic ileus. No significant small bowel dilatation is seen. Gastric catheter remains in the stomach. No free air is noted. IMPRESSION: Findings suspicious for colonic ileus. Electronically Signed   By: Violeta Grey M.D.   On: 02/26/2024 10:37   US  EKG SITE RITE Result Date: 02/26/2024 If Site Rite image not attached, placement could not be confirmed due to current cardiac rhythm.  DG Abd 1 View Result Date: 02/25/2024 CLINICAL DATA:  NG tube placement. EXAM: ABDOMEN - 1 VIEW COMPARISON:  One-view abdomen 02/24/2024 FINDINGS: Side port of the NG tube is in the fundus the stomach. Slight decompression of dilated loops of small bowel noted. Implanted loop recorder again noted. IMPRESSION: 1. Side port of the NG tube is in the fundus the stomach. 2. Slight decompression of dilated loops of small bowel. Electronically Signed   By: Audree Leas M.D.   On: 02/25/2024 11:46    Anti-infectives: Anti-infectives (From admission, onward)    Start     Dose/Rate Route Frequency Ordered Stop   02/24/24 0600  piperacillin -tazobactam (ZOSYN ) IVPB 3.375 g  Status:  Discontinued        3.375 g 12.5 mL/hr over 240 Minutes Intravenous Every 8 hours 02/23/24 2127 02/24/24 1138   02/23/24 2015  piperacillin -tazobactam (ZOSYN ) IVPB 3.375 g        3.375 g 100 mL/hr over 30 Minutes Intravenous  Once 02/23/24 2010 02/23/24 2224       Assessment/Plan: Colonic ileus -no indication for surgery for him -will sign off  Daniel Stuart 02/27/2024

## 2024-02-28 ENCOUNTER — Inpatient Hospital Stay (HOSPITAL_COMMUNITY)

## 2024-02-28 DIAGNOSIS — R600 Localized edema: Secondary | ICD-10-CM

## 2024-02-28 DIAGNOSIS — N179 Acute kidney failure, unspecified: Secondary | ICD-10-CM | POA: Diagnosis not present

## 2024-02-28 DIAGNOSIS — Z8679 Personal history of other diseases of the circulatory system: Secondary | ICD-10-CM | POA: Diagnosis not present

## 2024-02-28 DIAGNOSIS — E46 Unspecified protein-calorie malnutrition: Secondary | ICD-10-CM

## 2024-02-28 DIAGNOSIS — K5981 Ogilvie syndrome: Secondary | ICD-10-CM | POA: Diagnosis not present

## 2024-02-28 DIAGNOSIS — M7989 Other specified soft tissue disorders: Secondary | ICD-10-CM

## 2024-02-28 LAB — GLUCOSE, CAPILLARY
Glucose-Capillary: 119 mg/dL — ABNORMAL HIGH (ref 70–99)
Glucose-Capillary: 119 mg/dL — ABNORMAL HIGH (ref 70–99)
Glucose-Capillary: 121 mg/dL — ABNORMAL HIGH (ref 70–99)
Glucose-Capillary: 125 mg/dL — ABNORMAL HIGH (ref 70–99)
Glucose-Capillary: 201 mg/dL — ABNORMAL HIGH (ref 70–99)
Glucose-Capillary: 95 mg/dL (ref 70–99)

## 2024-02-28 LAB — BASIC METABOLIC PANEL WITH GFR
Anion gap: 9 (ref 5–15)
BUN: 5 mg/dL — ABNORMAL LOW (ref 6–20)
CO2: 19 mmol/L — ABNORMAL LOW (ref 22–32)
Calcium: 8.2 mg/dL — ABNORMAL LOW (ref 8.9–10.3)
Chloride: 116 mmol/L — ABNORMAL HIGH (ref 98–111)
Creatinine, Ser: 1.19 mg/dL (ref 0.61–1.24)
GFR, Estimated: >60 mL/min (ref >60)
Glucose, Bld: 120 mg/dL — ABNORMAL HIGH (ref 70–99)
Potassium: 3.8 mmol/L (ref 3.5–5.1)
Sodium: 144 mmol/L (ref 135–145)

## 2024-02-28 LAB — MAGNESIUM: Magnesium: 1.9 mg/dL (ref 1.7–2.4)

## 2024-02-28 LAB — PHOSPHORUS: Phosphorus: 4.1 mg/dL (ref 2.5–4.6)

## 2024-02-28 MED ORDER — BOOST / RESOURCE BREEZE PO LIQD CUSTOM
1.0000 | Freq: Three times a day (TID) | ORAL | Status: DC
  Administered 2024-02-28 – 2024-02-29 (×4): 1 via ORAL

## 2024-02-28 MED ORDER — POTASSIUM CHLORIDE 10 MEQ/50ML IV SOLN
10.0000 meq | INTRAVENOUS | Status: AC
Start: 2024-02-28 — End: 2024-02-28
  Administered 2024-02-28 (×4): 10 meq via INTRAVENOUS
  Filled 2024-02-28 (×4): qty 50

## 2024-02-28 MED ORDER — PANTOPRAZOLE SODIUM 40 MG PO TBEC
40.0000 mg | DELAYED_RELEASE_TABLET | Freq: Every day | ORAL | Status: DC
  Administered 2024-02-28 – 2024-03-09 (×11): 40 mg via ORAL
  Filled 2024-02-28 (×11): qty 1

## 2024-02-28 MED ORDER — ENSURE ENLIVE PO LIQD
237.0000 mL | Freq: Two times a day (BID) | ORAL | Status: DC
  Administered 2024-02-29 – 2024-03-05 (×10): 237 mL via ORAL

## 2024-02-28 MED ORDER — TRAVASOL 10 % IV SOLN
INTRAVENOUS | Status: AC
  Filled 2024-02-28: qty 580.8

## 2024-02-28 NOTE — Progress Notes (Signed)
TED hose placed.

## 2024-02-28 NOTE — TOC Initial Note (Signed)
 Transition of Care [facility] [place] [facility]) - Initial/Assessment Note    Patient Details  Name: Daniel Stuart MRN: [id number] Date of Birth: 1966-04-14  [facility]) CM/SW Contact:    Daniel Lie A [place], LCSW Phone Number: 02/28/2024, 11:29 AM  Clinical Narrative:                  CSW contacted pt's sister Daniel Stuart to complete assessment as pt is only oriented x2. She informed CSW pt is from [facility] for long term care and will return when stable for DC.  CSW followed up with [facility], confirmed pt is to return when stable.    TOC will continue to follow.        Patient Goals and CMS Choice            Expected Discharge Plan and Services                                              Prior Living Arrangements/Services                       Activities of Daily Living   ADL Screening (condition at time of admission) Independently performs ADLs?: No Does the patient have a NEW difficulty with bathing/dressing/toileting/self-feeding that is expected to last >3 days?: No Does the patient have a NEW difficulty with getting in/out of bed, walking, or climbing stairs that is expected to last >3 days?: No Does the patient have a NEW difficulty with communication that is expected to last >3 days?: No Is the patient deaf or have difficulty hearing?: No Does the patient have difficulty seeing, even when wearing glasses/contacts?: Yes Does the patient have difficulty concentrating, remembering, or making decisions?: Yes  Permission Sought/Granted                  Emotional Assessment              Admission diagnosis:  [facility]) [K56.7] Patient Active Problem List   Diagnosis Date Noted   Edema of right upper arm 02/28/2024   Protein calorie malnutrition (HCC) 02/27/2024   Abnormal CT scan, colon 02/27/2024   Pressure injury of skin 02/24/2024   Generalized abdominal pain 02/24/2024   Colonic pseudoobstruction 02/23/2024   Colitis 02/23/2024    Hypokalemia 02/23/2024   AKI (acute kidney injury) (HCC) 02/23/2024   History of intracranial hemorrhage 02/23/2024   Essential hypertension 02/23/2024   Antiphospholipid syndrome (HCC) 02/23/2024   Left carotid artery occlusion 09/09/2022   Weakness of both lower extremities 09/07/2022   Bedbound 08/25/2022   PAD (peripheral artery disease) (HCC) 08/25/2022   Arrhythmia as indication for cardiac pacemaker replacement 03/11/2020   Urinary frequency 03/11/2020   Vitamin D  deficiency 09/13/2019   History of CVA (cerebrovascular accident) 03/12/2019   Right sided weakness 03/12/2019   Anxiety 03/12/2019   Hyperlipidemia    Tobacco abuse 04/12/2017   Dyslipidemia 04/12/2017   PVD (peripheral vascular disease) (HCC) 04/12/2017   Carotid arterial disease (HCC) 04/12/2017   CVA (cerebral vascular accident) (HCC) 04/11/2017   PCP:  Paseda, Daniel R, [name] Pharmacy:   [facility] - [facility] [address], [address] [place] [address] Phone: [phone] Fax: [phone]  [facility] (Now [facility]) - [address] [place], [place] - [phone]  [place] [address] Phone: [phone] Fax: [phone]     Social Drivers of Health (SDOH) Social History: SDOH Screenings   Food Insecurity: Patient Unable To Answer (02/24/2024)  Housing: Patient Unable To Answer (02/24/2024)  Transportation Needs: Patient Unable To Answer (02/24/2024)  Utilities: Patient Unable To Answer (02/24/2024)  Alcohol Screen: Low Risk  (07/31/2021)  Depression (PHQ2-9): Low Risk  (04/13/2022)  Financial Resource Strain: Medium Risk (07/31/2021)  Physical Activity: Inactive (07/31/2021)  Social Connections: Socially Isolated (07/31/2021)  Stress: No Stress Concern Present (07/31/2021)  Tobacco Use: High Risk (02/23/2024)   SDOH Interventions:     Readmission Risk Interventions     No data to display

## 2024-02-28 NOTE — Progress Notes (Signed)
 PROGRESS NOTE    Daniel Stuart  ZOX:[id number] DOB: 08-17-1966 DOA: 02/23/2024 PCP: Paseda, Folashade R, [name]  Subjective: Pt seen and examined.  Had another liquid BM recorded this AM at 0553.  Repeat abd XR this AM shows continued non-obstructive gas pattern. NG was clamped/gravity drain yesterday.  Will remove NG today.  Stop TPN and continue with clear liquids.  Pt with swollen right arm yesterday. U/S right UE ordered last night. Not completed yet. Pt not on systemic anticoagulation or chemical DVT prophylaxis due to prior large ICH last year while on Eliquis and having blood in NG tube while it was on suction.   Hospital Course: HPI: Daniel Stuart is a 58 y.o. 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  Reported patient did not have any bowel movement for 3 days and not eating well.  Denies any nausea and vomiting.  Patient stating that he has tried laxative without any improvement.  At the baseline patient is bedbound nonambulatory due to prior neurological deficit. Denies any fever chills, nausea, vomiting, and diarrhea.     ED Course:  At presentation to ED patient is hemodynamically stable CBC showing leukocytosis 15.9.  Normal RBC, hemoglobin and platelet count. CMP showing low potassium 3, elevated creatinine 1.3, low albumin 3.4.  Pending lactic acid level.   CT abdomen pelvis: 1. Diffuse dilatation of the colon with air-fluid levels worrisome for colonic ileus the. Mild wall thickening of the  transverse compatible with colitis cannot exclude pneumatosis in the ascending colon. Correlate clinically for ischemia. 2. No free intraperitoneal air. 3. Nonobstructing bilateral renal calculi. 4. Patchy ground-glass opacities in the bilateral lower lobes worrisome for infection or inflammation. 5. Aortic atherosclerosis.   Dr. Guss Legacy last consulted and spoke with general surgery Dr. Aldean Hummingbird.  Per general surgery in the setting of ileus recommended conservative management, trend lactic acid and there is no need for surgery to involved in this case. However if something emergently arises need to inform surgery to assess the patient.     Due to colitis in the ED patient has been started on Zosyn .  Due to hypokalemia repleted with IV KCl 40 mEq.  Lactic acid has been ordered.  Patient also received 500 mL of LR bolus.     Hospitalist has been consulted for further evaluation and management of ileus, colitis and AKI.  Significant Events: Admitted 02/23/2024 for ileus   Significant Labs: WBC 15.9, HgB 14.7, plt 219 Na 139, K 3.0, CO2 of 24, BUN 9, Scr 1.32, glu 98 UA negative C. Diff negative antigen, negative toxin  Significant Imaging Studies: CT abd/pelvis Diffuse dilatation of the colon with air-fluid levels worrisome for colonic ileus the. Mild wall thickening of the transverse compatible with colitis cannot exclude pneumatosis in the ascending colon. Correlate clinically for ischemia. 2. No free intraperitoneal air.  3. Nonobstructing bilateral renal calculi. 4. Patchy ground-glass opacities in the bilateral lower lobes worrisome for infection or  inflammation. 5. Aortic atherosclerosis.  Antibiotic Therapy: Anti-infectives (From admission, onward)    Start     Dose/Rate Route Frequency Ordered Stop   02/24/24 0600  piperacillin -tazobactam (ZOSYN ) IVPB 3.375 g        3.375 g  12.5 mL/hr over 240 Minutes Intravenous Every 8 hours 02/23/24 2127     02/23/24 2015   piperacillin -tazobactam (ZOSYN ) IVPB 3.375 g        3.375 g 100 mL/hr over 30 Minutes Intravenous  Once 02/23/24 2010 02/23/24 2224       Procedures:   Consultants:     Assessment and Plan: * Colonic pseudoobstruction 02-24-2024 pt does not have SBO. He has colonic ileus. Pt being followed by general surgery. Remains on IVF.  02-25-2024 GI wants him to have NG for gastric decompression. RN unable to place at bedside. Will order NG tube by radiology.  02-26-2024 NG tube placed by daytime RN yesterday. On intermittent suction. NG tube with old blood. Will start IV protonix  40 mg bid.  Repeat KUB today shows continue large bowel dilatation. Not significantly changed on my read even after NG tube placement.  Had discussed with RD yesterday. Pt without significant nutrition for nearly 1 week. No evidence of systemic infection. Will place PICC and start TPN   02-27-2024 awaiting Gi to f/u on patient. Has had over 48 hours of NG tube decompression with only marginal improvement in colonic distension/ileus. Remains on TPN for nutrition.  Pt had large type 7 liquid BM today. Will repeat KUB.  02-28-2024 seems to have resolved. Will DC NG tube and finish out TPN bag today and stop it.  Continue with clear liquids today. Continue with BID miralax.  Maybe adv to full liquids today if he continues with Bms.  Edema of right upper arm 02-28-2024 right UE U/S ordered. Right UE PIV removed yesterday.  Colitis 02-24-2024  continue with IVF, IV ABX. General surgery consulted. *update. Discussed with general surgery. No need for further IV ABX. Will stop IV Zosyn .  02-25-2024 discussed with surgery yesterday. No role for abx as surgery does not think he has colitis.  AKI (acute kidney injury) (HCC) 02-24-2024 resolved with IVF.  02-25-2024 continue with IVF.  02-26-2024 continue with IVF.  02-27-2024 improved with TPN.  02-28-2024 resolved.  Hypokalemia 02-24-2024 continue with IV Kcl  replacement.  02-25-2024 replete with IV Kcl and IV magnesium.  02-26-2024 continue with aggressive IV Kcl replacement. Will ask pharmacist to add electrolytes to TPN.  02-27-2024 pharmacy to adjust electrolytes in TPN.  02-28-2024 improved and stable after replacement.  Pressure injury of skin Pressure Injury 02/24/24 Buttocks Left Stage 2 -  Partial thickness loss of dermis presenting as a shallow open injury with a red, pink wound bed without slough. open and red (Active)  02/24/24 0000  Location: Buttocks  Location Orientation: Left  Staging: Stage 2 -  Partial thickness loss of dermis presenting as a shallow open injury with a red, pink wound bed without slough.  Wound Description (Comments): open and red  Present on Admission: Yes     Pressure Injury 02/24/24 Ankle Posterior;Right;Other (Comment) Stage 2 -  Partial thickness loss of dermis presenting as a shallow open injury with a red, pink wound bed without slough. black and appears healing pressure injury (Active)  02/24/24 0000  Location: Ankle  Location Orientation: Posterior;Right;Other (Comment)  Staging: Stage 2 -  Partial thickness loss of dermis presenting as a shallow open injury with a red, pink wound bed without slough.  Wound Description (Comments): black and appears healing pressure injury  Present on Admission: Yes      Essential hypertension 02-24-2024 stable.   02-25-2024 BP low. BP meds on hold.  02-26-2024 chronic. BP stable off HTN meds.  02-27-2024  BP stable. HR slightly elevated. Continue to monitor.  02-28-2024 according to med rec, pt not receiving any HTN meds at SNF.  History of intracranial hemorrhage 02-24-2024 chronic. Resolved. Not a candidate for systemic anticoagulation due to prior ICH while taking Eliquis.  02-26-2024 chronic.   02-27-2024 chronic.  02-28-2024 stable.   History of CVA (cerebrovascular accident) 02-24-2024 chronic. Resolved. Not a candidate for systemic  anticoagulation due to prior ICH while taking Eliquis.  02-26-2024 chronic.  02-27-2024 chronic.  02-28-2024 stable.  Hyperlipidemia 02-24-2024 po statin on hold for now.  02-26-2024 continue to hold statin due to NPO status.  02-27-2024 continue to hold statin. NPO.  02-28-2024 restart statin when taking po solids.  Protein calorie malnutrition (HCC) Nutrition Status: Nutrition Problem: Inadequate oral intake Etiology: inability to eat Signs/Symptoms: NPO status Interventions: Refer to RD note for recommendations, TPN, MVI    Bedbound 02-25-2024 chronically bedbound at SNF due to multiple prior CVAs.  02-27-2024 chronic.  02-28-2024 chronic.      DVT prophylaxis: Place and maintain sequential compression device Start: 02/26/24 1011 SCDs Start: 02/23/24 2056 Place TED hose Start: 02/23/24 2056     Code Status: Full Code Family Communication: discussed with pt's sister donna yesterday Disposition Plan: return to SNF Reason for continuing need for hospitalization: removing NG tube today. On clear liquids. Awaiting right UE U/S.  Objective: Vitals:   02/27/24 2005 02/27/24 2202 02/27/24 2345 02/28/24 0450  BP: 101/72  111/81 103/73  Pulse: (!) 118  (!) 113 (!) 109  Resp: 18  18 18   Temp: (!) 102.7 F (39.3 C) 100 F (37.8 C) 99.2 F (37.3 C) 98.7 F (37.1 C)  TempSrc: Oral Oral Oral Oral  SpO2: 91%  92% 91%  Weight:      Height:        Intake/Output Summary (Last 24 hours) at 02/28/2024 0930 Last data filed at 02/28/2024 0553 Gross per 24 hour  Intake 1948.58 ml  Output 1225 ml  Net 723.58 ml   Filed Weights   02/23/24 1612  Weight: 60 kg    Examination:  Physical Exam Vitals and nursing note reviewed.  Constitutional:      Comments: Chronically ill appearing  HENT:     Head: Normocephalic and atraumatic.     Nose:     Comments: + NG tube in place Cardiovascular:     Rate and Rhythm: Regular rhythm. Tachycardia present.  Pulmonary:      Effort: Pulmonary effort is normal. No respiratory distress.     Breath sounds: Normal breath sounds.  Abdominal:     Comments: Abd is soft, slightly distended/round. Normal BS.  Musculoskeletal:     Right upper arm: Swelling and edema present.     Comments: + right UE edema/swelling. Prior right forearm PIV has been removed.  Skin:    General: Skin is warm and dry.     Data Reviewed: I have personally reviewed following labs and imaging studies  CBC: Recent Labs  Lab 02/23/24 1419 02/24/24 0706  WBC 15.9* 11.7*  HGB 14.7 13.0  HCT 43.6 37.9*  MCV 85.2 83.1  PLT 219 189   Basic Metabolic Panel: Recent Labs  Lab 02/25/24 0823 02/26/24 0615 02/26/24 2112 02/27/24 0438 02/27/24 1755 02/28/24 0357  NA 142 143  --  146* 146* 144  K 2.6* 2.9* 3.1* 3.3* 3.5 3.8  CL 108 110  --  119* 117* 116*  CO2 21* 19*  --  17* 18* 19*  GLUCOSE 74 67*  --  120* 135* 120*  BUN 10 7  --  5* 5* 5*  CREATININE 1.40* 1.32*  --  1.21 1.16 1.19  CALCIUM  8.4* 8.0*  --  8.4* 8.4* 8.2*  MG 1.7 2.1  --  2.0 2.0 1.9  PHOS  --  2.6  --  <1.0* 1.6* 4.1   GFR: Estimated Creatinine Clearance: 56.7 mL/min (by C-G formula based on SCr of 1.19 mg/dL). Liver Function Tests: Recent Labs  Lab 02/23/24 1419 02/24/24 0706 02/27/24 0438 02/27/24 1755  AST 15 11* 18  --   ALT 13 11 16   --   ALKPHOS 88 63 66  --   BILITOT 1.0 0.9 1.0  --   PROT 7.6 5.9* 6.7  --   ALBUMIN 3.4* 2.6* 2.8* 2.8*   CBG: Recent Labs  Lab 02/27/24 1253 02/27/24 1730 02/27/24 2346 02/28/24 0455 02/28/24 0903  GLUCAP 90 154* 121* 125* 119*   Lipid Profile: Recent Labs    02/26/24 0615  TRIG 91   Sepsis Labs: Recent Labs  Lab 02/24/24 0038  LATICACIDVEN 1.6    Recent Results (from the past 240 hours)  Culture, blood (Routine X 2) w Reflex to ID Panel     Status: None (Preliminary result)   Collection Time: 02/24/24 12:38 AM   Specimen: BLOOD LEFT ARM  Result Value Ref Range Status   Specimen  Description BLOOD LEFT ARM  Final   Special Requests   Final    BOTTLES DRAWN AEROBIC AND ANAEROBIC Blood Culture adequate volume   Culture   Final    NO GROWTH 4 DAYS Performed at [facility], [address] [place], [place] [address]    Report Status PENDING  Incomplete  Culture, blood (Routine X 2) w Reflex to ID Panel     Status: None (Preliminary result)   Collection Time: 02/24/24 12:40 AM   Specimen: BLOOD LEFT HAND  Result Value Ref Range Status   Specimen Description BLOOD LEFT HAND  Final   Special Requests   Final    BOTTLES DRAWN AEROBIC AND ANAEROBIC Blood Culture adequate volume   Culture   Final    NO GROWTH 4 DAYS Performed at [facility], [address] [place], [place] [address]    Report Status PENDING  Incomplete  C Difficile Quick Screen w PCR reflex     Status: None   Collection Time: 02/24/24  2:20 AM   Specimen: STOOL  Result Value Ref Range Status   C Diff antigen NEGATIVE NEGATIVE Final   C Diff toxin NEGATIVE NEGATIVE Final   C Diff interpretation No C. difficile detected.  Final    Comment: Performed at [facility], [address], [place], [place] [address]  MRSA Next Gen by PCR, Nasal     Status: Abnormal   Collection Time: 02/24/24  4:45 AM   Specimen: Nasal Mucosa; Nasal Swab  Result Value Ref Range Status   MRSA by PCR Next Gen DETECTED (A) NOT DETECTED Final    Comment: RESULT CALLED TO, READ BACK BY AND VERIFIED WITH: M YOUNG,RN@0656  02/24/24 MK (NOTE) The GeneXpert MRSA Assay (FDA approved for NASAL specimens only), is one component of a comprehensive MRSA colonization surveillance program. It is not intended to diagnose MRSA infection nor to guide or monitor treatment for MRSA infections. Test performance is not FDA approved in patients less than 2 years old. Performed at [facility], [address], [place], [place] [address]  Radiology Studies: DG Abd 1 View Result Date: 02/28/2024 CLINICAL DATA:   Colonic dilatation, possible pseudo-obstruction EXAM: ABDOMEN - 1 VIEW COMPARISON:  Film from the previous day. FINDINGS: Scattered large and small bowel gas is noted. No obstructive changes are seen. No free air is noted. Degenerative changes of lumbar spine are seen. IMPRESSION: No obstructive pattern is noted. Electronically Signed   By: Violeta Grey M.D.   On: 02/28/2024 08:25   DG Abd Portable 1V Result Date: 02/27/2024 CLINICAL DATA:  Colonic pseudo-obstruction.  Abdominal pain. EXAM: PORTABLE ABDOMEN - 1 VIEW COMPARISON:  Abdominal radiograph dated 02/24/2024. FINDINGS: Enteric tube in similar position. No bowel dilatation or evidence of obstruction. Resolution of the previously seen dilated colon. No free air or radiopaque calculi. The osseous structures are intact. The soft tissues are unremarkable. IMPRESSION: Nonobstructive bowel gas pattern. Electronically Signed   By: Angus Bark M.D.   On: 02/27/2024 15:31   DG CHEST PORT 1 VIEW Result Date: 02/26/2024 CLINICAL DATA:  PICC line placement EXAM: PORTABLE CHEST 1 VIEW COMPARISON:  09/08/2022 FINDINGS: Left upper extremity PICC line tip seen within the superior cavoatrial junction. Nasogastric tube extends into the upper abdomen within the expected gastric cardia. Bibasilar atelectasis is present. Lung volumes are small but are symmetric. No pneumothorax or pleural effusion. Cardiac size is within normal limits when accounting for poor pulmonary insufflation. Implanted loop recorder noted. Pulmonary vascularity is normal. No acute. IMPRESSION: 1. Left upper extremity PICC line tip within the superior cavoatrial junction. 2. Pulmonary hypoinflation. Electronically Signed   By: Worthy Heads M.D.   On: 02/26/2024 19:26    Scheduled Meds:  baclofen   10 mg Oral TID   Chlorhexidine  Gluconate Cloth  6 each Topical Daily   gabapentin   300 mg Oral BID   insulin aspart  0-9 Units Subcutaneous Q6H   mupirocin  ointment  1 Application Nasal BID    mouth rinse  15 mL Mouth Rinse 4 times per day   pantoprazole  (PROTONIX ) IV  40 mg Intravenous Q12H   polyethylene glycol  34 g Oral BID   sodium chloride  flush  3 mL Intravenous Q12H   Continuous Infusions:  TPN ADULT (ION) 30 mL/hr at 02/28/24 0124     LOS: 5 days   Time spent: 45 minutes  Unk Garb, DO  [facility]  02/28/2024, 9:30 AM

## 2024-02-28 NOTE — Progress Notes (Signed)
 PHARMACY - TOTAL PARENTERAL NUTRITION CONSULT NOTE   Indication:  colonic ileus  Patient Measurements: Height: 5\' 6"  (167.6 cm) Weight: 60 kg (132 lb 4.4 oz) IBW/kg (Calculated) : 63.8 TPN AdjBW (KG): 60 Body mass index is 21.35 kg/m. Usual Weight: 129 lb (58 kg) per sister  Assessment: 4 yom with cognitive impairment s/p ICH admitted from SNF with abdominal distension and lack of BM x3 days. Surgery suspected colonic ileus with possible pneumatosis and recommended conservative management with bowel rest, signed off 4/28. GI consulted and NGT placed on 4/26. Per RD note, patient did not consume anything PO for ~5-6 days. He is having some bowel function but remains distended. Pharmacy consulted to begin TPN.  Glucose / Insulin: preDM, A1c 6.1 (06/2023); BG 90 - 154 with 3u SSI past 24h Electrolytes: Na 144 (from 146, none in TPN), K 3.8 (s/p 100 mEq IV and ~88 mEq from KPhos, goal >= 4), Mg 1.9 (goal >= 2), Phos 4.1 (s/p 60 mmol KPhos), Cl 116, CO2 19 Renal: SCr 1.19 (baseline ~1-1.1), BUN 5 Hepatic: LFTs, Alk Phos / Tbili / TG WNL, albumin 2.8 Intake / Output; MIVF: UOP 0.4 mL/kg/h (600 mL), 625 mL emesis/NGT output, LBM 4/28 4/28 had 48h NGT decompression with marginal improvement  GI Imaging: 4/24 CT A/P: diffuse dilatation of the colon with air-fluid levels that is concerning for colonic ileus with mild wall thickening of the transverse colon compatible with colitis  4/25 KUB: stable diffuse colonic dilatation most consistent with ileus  4/26 KUB: slight decompression of dilated loops of small bowel  4/27 KUB: colonic ileus 4/28-29 KUB: nonobstructive gas pattern GI Surgeries / Procedures:   Central access: PICC to be placed 4/27 per IV team TPN start date: 4/27  Nutritional Goals: Goal TPN rate is 75 mL/hr (provides 79 g of protein and 1670 kcals per day)  RD Assessment: (4/26) Estimated Needs Total Energy Estimated Needs: 1600-1800kcal Total Protein Estimated Needs:  75-90g Total Fluid Estimated Needs: >1.6L/day  Current Nutrition:  NPO and TPN  Plan:  Advance TPN to 55 mL/hr at 1800  Electrolytes in TPN: remove Na 4/28, K 60 mEq/L (54 > 79 mEq), Ca 5 mEq/L, Mg 5 mEq/L (total 3.6 > 6.6), and Phos 15 mmol/L. Max Acetate Add standard MVI and trace elements to TPN, no chromium due to shortage/backorder Give KCl 10 mEq IV x4 Add thiamine  100 mg to TPN daily for 5 days (4/27 >> 5/1) Initiate Sensitive q6h SSI and adjust as needed  Monitor TPN labs on Mon/Thurs, daily until stable - next labs 4/30 AM  Thank you for involving pharmacy in this patient's care.  Heddy Liverpool, PharmD PGY2 Critical Care Pharmacy Resident 02/28/2024 8:08 AM

## 2024-02-28 NOTE — Plan of Care (Signed)
  Problem: Clinical Measurements: Goal: Ability to maintain clinical measurements within normal limits will improve Outcome: Progressing Goal: Respiratory complications will improve Outcome: Progressing Goal: Cardiovascular complication will be avoided Outcome: Progressing   Problem: Coping: Goal: Level of anxiety will decrease Outcome: Progressing   Problem: Elimination: Goal: Will not experience complications related to bowel motility Outcome: Progressing Goal: Will not experience complications related to urinary retention Outcome: Progressing   Problem: Pain Managment: Goal: General experience of comfort will improve and/or be controlled Outcome: Progressing   Problem: Safety: Goal: Ability to remain free from injury will improve Outcome: Progressing   Problem: Coping: Goal: Ability to adjust to condition or change in health will improve Outcome: Progressing   Problem: Fluid Volume: Goal: Ability to maintain a balanced intake and output will improve Outcome: Progressing   Problem: Tissue Perfusion: Goal: Adequacy of tissue perfusion will improve Outcome: Progressing

## 2024-02-28 NOTE — Plan of Care (Signed)
  Problem: Clinical Measurements: Goal: Ability to maintain clinical measurements within normal limits will improve Outcome: Progressing Goal: Will remain free from infection Outcome: Progressing Goal: Diagnostic test results will improve Outcome: Progressing Goal: Respiratory complications will improve Outcome: Progressing   Problem: Activity: Goal: Risk for activity intolerance will decrease Outcome: Progressing   Problem: Nutrition: Goal: Adequate nutrition will be maintained Outcome: Progressing   Problem: Elimination: Goal: Will not experience complications related to bowel motility Outcome: Progressing Goal: Will not experience complications related to urinary retention Outcome: Progressing   Problem: Pain Managment: Goal: General experience of comfort will improve and/or be controlled Outcome: Progressing   Problem: Safety: Goal: Ability to remain free from injury will improve Outcome: Progressing   Problem: Skin Integrity: Goal: Risk for impaired skin integrity will decrease Outcome: Progressing

## 2024-02-28 NOTE — Progress Notes (Signed)
 Nasogastric tube removed

## 2024-02-28 NOTE — Assessment & Plan Note (Signed)
 02-28-2024 right UE U/S ordered. Right UE PIV removed yesterday.

## 2024-02-28 NOTE — Progress Notes (Addendum)
   Discussed with bedside RN. Pt had another BM around 9-10 AM. Was liquid. Pt does not want to advance to solid food. Will change to full liquid diet in AM.  Continue with Bid miralax.  Right UE U/S negative for DVT.  Unk Garb, DO [facility]

## 2024-02-29 DIAGNOSIS — K5981 Ogilvie syndrome: Secondary | ICD-10-CM | POA: Diagnosis not present

## 2024-02-29 LAB — CULTURE, BLOOD (ROUTINE X 2)
Culture: NO GROWTH
Culture: NO GROWTH
Special Requests: ADEQUATE
Special Requests: ADEQUATE

## 2024-02-29 LAB — GLUCOSE, CAPILLARY
Glucose-Capillary: 116 mg/dL — ABNORMAL HIGH (ref 70–99)
Glucose-Capillary: 128 mg/dL — ABNORMAL HIGH (ref 70–99)
Glucose-Capillary: 135 mg/dL — ABNORMAL HIGH (ref 70–99)
Glucose-Capillary: 160 mg/dL — ABNORMAL HIGH (ref 70–99)
Glucose-Capillary: 206 mg/dL — ABNORMAL HIGH (ref 70–99)
Glucose-Capillary: 237 mg/dL — ABNORMAL HIGH (ref 70–99)

## 2024-02-29 LAB — BASIC METABOLIC PANEL WITH GFR
Anion gap: 9 (ref 5–15)
BUN: 9 mg/dL (ref 6–20)
CO2: 18 mmol/L — ABNORMAL LOW (ref 22–32)
Calcium: 8 mg/dL — ABNORMAL LOW (ref 8.9–10.3)
Chloride: 111 mmol/L (ref 98–111)
Creatinine, Ser: 1.12 mg/dL (ref 0.61–1.24)
GFR, Estimated: >60 mL/min (ref >60)
Glucose, Bld: 113 mg/dL — ABNORMAL HIGH (ref 70–99)
Potassium: 3.8 mmol/L (ref 3.5–5.1)
Sodium: 138 mmol/L (ref 135–145)

## 2024-02-29 LAB — PHOSPHORUS: Phosphorus: 2.8 mg/dL (ref 2.5–4.6)

## 2024-02-29 LAB — MAGNESIUM: Magnesium: 1.8 mg/dL (ref 1.7–2.4)

## 2024-02-29 MED ORDER — SIMETHICONE 40 MG/0.6ML PO SUSP
80.0000 mg | Freq: Four times a day (QID) | ORAL | Status: DC
  Administered 2024-02-29 – 2024-03-01 (×7): 80 mg via ORAL
  Filled 2024-02-29 (×10): qty 1.2

## 2024-02-29 MED ORDER — MAGNESIUM SULFATE 2 GM/50ML IV SOLN
2.0000 g | Freq: Once | INTRAVENOUS | Status: AC
  Administered 2024-02-29: 2 g via INTRAVENOUS
  Filled 2024-02-29: qty 50

## 2024-02-29 MED ORDER — INSULIN ASPART 100 UNIT/ML IJ SOLN
0.0000 [IU] | Freq: Three times a day (TID) | INTRAMUSCULAR | Status: DC
  Administered 2024-03-01 (×2): 2 [IU] via SUBCUTANEOUS
  Administered 2024-03-02: 1 [IU] via SUBCUTANEOUS
  Administered 2024-03-04 – 2024-03-07 (×2): 2 [IU] via SUBCUTANEOUS
  Administered 2024-03-08 – 2024-03-09 (×2): 1 [IU] via SUBCUTANEOUS

## 2024-02-29 MED ORDER — SODIUM CHLORIDE 0.9% FLUSH
10.0000 mL | Freq: Two times a day (BID) | INTRAVENOUS | Status: DC
  Administered 2024-02-29 – 2024-03-07 (×13): 10 mL
  Administered 2024-03-07: 20 mL
  Administered 2024-03-08 (×2): 10 mL

## 2024-02-29 MED ORDER — INSULIN ASPART 100 UNIT/ML IJ SOLN
0.0000 [IU] | Freq: Every day | INTRAMUSCULAR | Status: DC
  Administered 2024-02-29: 2 [IU] via SUBCUTANEOUS
  Administered 2024-03-01: 3 [IU] via SUBCUTANEOUS

## 2024-02-29 MED ORDER — SODIUM CHLORIDE 0.9% FLUSH: 10.0000 mL | INTRAVENOUS | Status: DC | PRN

## 2024-02-29 MED ORDER — POTASSIUM CHLORIDE CRYS ER 20 MEQ PO TBCR: 40.0000 meq | EXTENDED_RELEASE_TABLET | Freq: Once | ORAL | Status: DC

## 2024-02-29 MED ORDER — CHLORHEXIDINE GLUCONATE CLOTH 2 % EX PADS
6.0000 | MEDICATED_PAD | Freq: Every day | CUTANEOUS | Status: DC
  Administered 2024-02-29 – 2024-03-09 (×11): 6 via TOPICAL

## 2024-02-29 MED ORDER — POTASSIUM CHLORIDE 20 MEQ PO PACK
60.0000 meq | PACK | Freq: Once | ORAL | Status: AC
  Administered 2024-02-29: 60 meq via ORAL
  Filled 2024-02-29: qty 3

## 2024-02-29 NOTE — Progress Notes (Addendum)
 Nutrition Follow-up  DOCUMENTATION CODES:   Not applicable  INTERVENTION:  Continue Boost Breeze po TID, each supplement provides 250 kcal and 9 grams of protein  Order [employer] with meals, each supplement provides 290 kcal and 9 grams of protein  Multivitamin with minerals.  NUTRITION DIAGNOSIS:   Inadequate oral intake related to inability to eat as evidenced by NPO status. *improved, diet advanced  GOAL:   Patient will meet greater than or equal to 90% of their needs *met through TPN  MONITOR:   Diet advancement, Skin, Labs  REASON FOR ASSESSMENT:   Consult Other (Comment), Assessment of nutrition requirement/status (ileus, colitis, AKI)  ASSESSMENT:   Pt is from [facility]. C/o abdominal distention and lack of BM x3 days. No oral intake in three days. Facility concerned for bowel obstruction and sent pt via [facility] for assessment. PMH significant for: ICH (06/2023), TIA/stroke/progressive leukoencephalopathy/chronic left ICA stenosis, cognitive impairment, HLD, PVD, HTN.  Chart notes reveal liquid multiple liquid Bms. NG removed yesterday. TPN to expire today. Diet advanced to soft diet today. Pt reports eating adequately with FLD. Pt reports not liking ensure but RD observed boost breeze with 100% completion at bedside.   Medications reviewed and include: Miralax BID  Labs reviewed: CBGs 95-201 x 24h   Intake/Output Summary (Last 24 hours) at 02/29/2024 1535 Last data filed at 02/29/2024 1440 Gross per 24 hour  Intake 1684.36 ml  Output 550 ml  Net 1134.36 ml    NUTRITION - FOCUSED PHYSICAL EXAM:  Flowsheet Row Most Recent Value  Orbital Region No depletion  Upper Arm Region No depletion  Thoracic and Lumbar Region No depletion  Buccal Region No depletion  Temple Region No depletion  Clavicle Bone Region No depletion  Clavicle and Acromion Bone Region No depletion  Scapular Bone Region No depletion  Dorsal Hand No depletion  Patellar Region  No depletion  Anterior Thigh Region No depletion  Posterior Calf Region No depletion  Edema (RD Assessment) Moderate  Hair Reviewed  [dry flaky scalp]  Eyes Reviewed  Mouth Reviewed  Skin Reviewed  Nails Reviewed       Diet Order:   Diet Order             DIET SOFT Room service appropriate? Yes; Fluid consistency: Thin  Diet effective now                   EDUCATION NEEDS:   Not appropriate for education at this time  Skin:  Skin Assessment: Skin Integrity Issues: Skin Integrity Issues:: Stage II Stage II: L buttocks  Last BM:  4/29 T7x3  Height:   Ht Readings from Last 1 Encounters:  02/23/24 5\' 6"  (1.676 m)    Weight:   Wt Readings from Last 1 Encounters:  02/23/24 60 kg   BMI:  Body mass index is 21.35 kg/m.  Estimated Nutritional Needs:   Kcal:  1600-1800kcal  Protein:  75-90g  Fluid:  >1.6L/day  Laren Player, MPH, RD, LDN Clinical Dietitian Contact information can be found at [facility].

## 2024-02-29 NOTE — Progress Notes (Addendum)
 [facility] Progress Note Patient: Daniel Stuart [id number]:[id number] DOB: [birth year] DOA: 02/23/2024  DOS: the patient was seen and examined on 02/29/2024  Brief Hospital Course: Patient with PMH of ICH with residual weakness and bedbound, progressive leukoencephalopathy, PAD/left ICA stenosis, cortical blindness, HLD, HTN, antiphospholipid syndrome.  Presented the hospital with complaints of abdominal pain and distention.  Treated for colonic ileus with pseudoobstruction.  GI was consulted.  General surgery was consulted as well.  Conservatively managed.  Was on TPN.  Now diet advanced.  Assessment and Plan: Pseudoobstruction of colon. Presented with abdominal pain and distention.  CT scan shows evidence of colonic distention without any obstruction.  Concern for ileus.  General surgery was consulted.  NG tube was inserted.  Treated conservatively with IV fluid and bowel rest.  Was given TPN. GI was consulted as well.  As the ileus resolved patient was gradually advance to soft diet right now. GI has signed off.  General surgery has signed off.  Will monitor for improvement in oral intake. Patient has been chronically on baclofen .  There is a concern that that can lead to intestinal obstruction as well as ileus.  We discussed with regards to gradual reduction of the medication.  Right upper extremity edema. Doppler negative.  Monitor.  Fever. Patient had a temp of 100.7 on 4/28.  Continues to have some low-grade temp.  Tmax was 100.2 in last 24 hours. Initially there was some concern for colitis and patient given 1 dose of Zosyn . For now we will monitor but if fever reoccurs would have low threshold to initiate antibiotic and repeat abdominal imaging.  Mild renal dysfunction.  No AKI. Mild hypokalemia potential mild hypomagnesemia. Baseline creatinine appears to be around 1.2.  Serum creatinine on admission was 1.4.  Not meeting AKI criteria.  Treated with IV hydration. Potassium and  magnesium have been replaced.  Maintain K more than 4, mag more than 2.  Hyperglycemia. Seen incidentally while checking CBG for TPN. Will check A1c and monitor.  Hypophosphatemia Corrected.  Monitor.  Hyponatremia. Corrected.  Monitor.  HLD. On statin.  Currently on hold.  Chronic spasticity. On baclofen .  On gabapentin . Currently continued.   Subjective: No nausea no vomiting.  Wants to know when he can go home.  So far 1 BM today.  Physical Exam: General: in Mild distress, No Rash Cardiovascular: S1 and S2 Present, No Murmur Respiratory: Good respiratory effort, Bilateral Air entry present. No Crackles, No wheezes Abdomen: Bowel Sound present, distended, no tenderness Extremities: No edema Neuro: Alert and oriented x3, no new focal deficit  Data Reviewed: I have Reviewed nursing notes, Vitals, and Lab results. Since last encounter, pertinent lab results CBC and BMP   . I have ordered test including CBC and CMP  .   Disposition: Status is: Inpatient Remains inpatient appropriate because: Monitor for improvement in abdominal distention and oral intake.  Place and maintain sequential compression device Start: 02/26/24 1011 SCDs Start: 02/23/24 2056 Place TED hose Start: 02/23/24 2056   Family Communication: No one at bedside Level of care: Med-Surg   Vitals:   02/29/24 0532 02/29/24 0747 02/29/24 1150 02/29/24 1610  BP: 109/77 102/71 109/74 127/81  Pulse: (!) 108 (!) 106 (!) 105 (!) 110  Resp: 18 19 18 19   Temp: 100.2 F (37.9 C) 100 F (37.8 C) 98.6 F (37 C) 98 F (36.7 C)  TempSrc:   Oral Oral  SpO2: 94% 92% 93% 96%  Weight:      Height:  Author: Charlean Congress, MD 02/29/2024 7:23 PM  Please look on [email or web address] to find out who is on call.

## 2024-02-29 NOTE — Plan of Care (Signed)
  Problem: Clinical Measurements: Goal: Cardiovascular complication will be avoided Outcome: Progressing   Problem: Nutrition: Goal: Adequate nutrition will be maintained Outcome: Progressing   Problem: Coping: Goal: Level of anxiety will decrease Outcome: Progressing   Problem: Elimination: Goal: Will not experience complications related to bowel motility Outcome: Progressing Goal: Will not experience complications related to urinary retention Outcome: Progressing   Problem: Pain Managment: Goal: General experience of comfort will improve and/or be controlled Outcome: Progressing   Problem: Safety: Goal: Ability to remain free from injury will improve Outcome: Progressing   Problem: Skin Integrity: Goal: Risk for impaired skin integrity will decrease Outcome: Progressing   Problem: Tissue Perfusion: Goal: Adequacy of tissue perfusion will improve Outcome: Progressing

## 2024-02-29 NOTE — Plan of Care (Signed)

## 2024-03-01 ENCOUNTER — Inpatient Hospital Stay (HOSPITAL_COMMUNITY)

## 2024-03-01 DIAGNOSIS — K5981 Ogilvie syndrome: Secondary | ICD-10-CM | POA: Diagnosis not present

## 2024-03-01 LAB — CBC WITH DIFFERENTIAL/PLATELET
Abs Immature Granulocytes: 0.03 10*3/uL (ref 0.00–0.07)
Basophils Absolute: 0 10*3/uL (ref 0.0–0.1)
Basophils Relative: 0 %
Eosinophils Absolute: 0.2 10*3/uL (ref 0.0–0.5)
Eosinophils Relative: 2 %
HCT: 35.2 % — ABNORMAL LOW (ref 39.0–52.0)
Hemoglobin: 11.8 g/dL — ABNORMAL LOW (ref 13.0–17.0)
Immature Granulocytes: 0 %
Lymphocytes Relative: 17 %
Lymphs Abs: 1.6 10*3/uL (ref 0.7–4.0)
MCH: 27.9 pg (ref 26.0–34.0)
MCHC: 33.5 g/dL (ref 30.0–36.0)
MCV: 83.2 fL (ref 80.0–100.0)
Monocytes Absolute: 0.7 10*3/uL (ref 0.1–1.0)
Monocytes Relative: 8 %
Neutro Abs: 7 10*3/uL (ref 1.7–7.7)
Neutrophils Relative %: 73 %
Platelets: 175 10*3/uL (ref 150–400)
RBC: 4.23 MIL/uL (ref 4.22–5.81)
RDW: 13.2 % (ref 11.5–15.5)
WBC: 9.6 10*3/uL (ref 4.0–10.5)
nRBC: 0 % (ref 0.0–0.2)

## 2024-03-01 LAB — COMPREHENSIVE METABOLIC PANEL WITH GFR
ALT: 61 U/L — ABNORMAL HIGH (ref 0–44)
AST: 56 U/L — ABNORMAL HIGH (ref 15–41)
Albumin: 2.4 g/dL — ABNORMAL LOW (ref 3.5–5.0)
Alkaline Phosphatase: 51 U/L (ref 38–126)
Anion gap: 4 — ABNORMAL LOW (ref 5–15)
BUN: 10 mg/dL (ref 6–20)
CO2: 22 mmol/L (ref 22–32)
Calcium: 8.2 mg/dL — ABNORMAL LOW (ref 8.9–10.3)
Chloride: 112 mmol/L — ABNORMAL HIGH (ref 98–111)
Creatinine, Ser: 1.03 mg/dL (ref 0.61–1.24)
GFR, Estimated: >60 mL/min (ref >60)
Glucose, Bld: 169 mg/dL — ABNORMAL HIGH (ref 70–99)
Potassium: 4.2 mmol/L (ref 3.5–5.1)
Sodium: 138 mmol/L (ref 135–145)
Total Bilirubin: 0.6 mg/dL (ref 0.0–1.2)
Total Protein: 6.1 g/dL — ABNORMAL LOW (ref 6.5–8.1)

## 2024-03-01 LAB — HEMOGLOBIN A1C
Hgb A1c MFr Bld: 5.8 % — ABNORMAL HIGH (ref 4.8–5.6)
Mean Plasma Glucose: 119.76 mg/dL

## 2024-03-01 LAB — GLUCOSE, CAPILLARY
Glucose-Capillary: 123 mg/dL — ABNORMAL HIGH (ref 70–99)
Glucose-Capillary: 153 mg/dL — ABNORMAL HIGH (ref 70–99)
Glucose-Capillary: 162 mg/dL — ABNORMAL HIGH (ref 70–99)
Glucose-Capillary: 292 mg/dL — ABNORMAL HIGH (ref 70–99)

## 2024-03-01 LAB — MAGNESIUM: Magnesium: 2 mg/dL (ref 1.7–2.4)

## 2024-03-01 LAB — PHOSPHORUS: Phosphorus: 2.7 mg/dL (ref 2.5–4.6)

## 2024-03-01 MED ORDER — BACLOFEN 10 MG PO TABS
5.0000 mg | ORAL_TABLET | Freq: Three times a day (TID) | ORAL | Status: DC
  Administered 2024-03-01 – 2024-03-04 (×10): 5 mg via ORAL
  Filled 2024-03-01 (×10): qty 1

## 2024-03-01 MED ORDER — POLYETHYLENE GLYCOL 3350 17 G PO PACK
17.0000 g | PACK | Freq: Every day | ORAL | Status: DC
  Administered 2024-03-01 – 2024-03-04 (×2): 17 g via ORAL
  Filled 2024-03-01 (×2): qty 1

## 2024-03-01 MED ORDER — METOCLOPRAMIDE HCL 5 MG PO TABS
5.0000 mg | ORAL_TABLET | Freq: Three times a day (TID) | ORAL | Status: DC
  Administered 2024-03-01 – 2024-03-09 (×26): 5 mg via ORAL
  Filled 2024-03-01 (×26): qty 1

## 2024-03-01 NOTE — Plan of Care (Signed)
  Problem: Clinical Measurements: Goal: Ability to maintain clinical measurements within normal limits will improve Outcome: Progressing Goal: Respiratory complications will improve Outcome: Progressing Goal: Cardiovascular complication will be avoided Outcome: Progressing   Problem: Clinical Measurements: Goal: Respiratory complications will improve Outcome: Progressing   Problem: Clinical Measurements: Goal: Cardiovascular complication will be avoided Outcome: Progressing   Problem: Nutrition: Goal: Adequate nutrition will be maintained Outcome: Progressing   Problem: Coping: Goal: Level of anxiety will decrease Outcome: Progressing   Problem: Elimination: Goal: Will not experience complications related to bowel motility Outcome: Progressing Goal: Will not experience complications related to urinary retention Outcome: Progressing   Problem: Pain Managment: Goal: General experience of comfort will improve and/or be controlled Outcome: Progressing   Problem: Safety: Goal: Ability to remain free from injury will improve Outcome: Progressing   Problem: Skin Integrity: Goal: Risk for impaired skin integrity will decrease Outcome: Progressing

## 2024-03-01 NOTE — Plan of Care (Signed)

## 2024-03-01 NOTE — Progress Notes (Signed)
 [facility] Progress Note Patient: Daniel Stuart [id number]:[id number] DOB: 1965-12-01 DOA: 02/23/2024  DOS: the patient was seen and examined on 03/01/2024  Brief Hospital Course: Patient with PMH of ICH with residual weakness and bedbound, progressive leukoencephalopathy, PAD/left ICA stenosis, cortical blindness, HLD, HTN, antiphospholipid syndrome.  Presented the hospital with complaints of abdominal pain and distention.  Treated for colonic ileus with pseudoobstruction.  GI was consulted.  General surgery was consulted as well.  Conservatively managed.  Was on TPN.  Now diet advanced.  Assessment and Plan: Pseudoobstruction of colon. Presented with abdominal pain and distention. CT scan shows evidence of colonic distention without any obstruction.  Concern for ileus. General surgery was consulted.  NG tube was inserted.  Treated conservatively with IV fluid and bowel rest.  Was given TPN. GI was consulted as well.  As the ileus resolved patient was gradually advance to soft diet right now. GI has signed off.  General surgery has signed off. Patient has been chronically on baclofen .  There is a concern that that can lead to intestinal obstruction as well as ileus.  Will reduce the dose.  Right upper extremity edema. Doppler negative.  Monitor.  Low-grade fever. Intermittently low-grade temp.  Tmax rarely above 101.  Was 102.7 on 4/28. Initially there was some concern for colitis and patient given 1 dose of Zosyn . For now we will monitor but if fever reoccurs would have low threshold to initiate antibiotic and repeat abdominal imaging.  Mild renal dysfunction.  No AKI. Mild hypokalemia mild hypomagnesemia. Baseline creatinine appears to be around 1.2.  Serum creatinine on admission was 1.4.  Not meeting AKI criteria.  Treated with IV hydration. Potassium and magnesium  have been replaced.  Maintain K more than 4, mag more than 2.  Hyperglycemia. Seen incidentally while checking CBG  for TPN. Hemoglobin A1c 5.8.  Naiping diagnosis of type II DM.   Continue sliding scale insulin .  Hypophosphatemia Corrected.  Monitor.  HLD. On statin.  Currently on hold.  Chronic spasticity. On baclofen .  On gabapentin . Currently continued.  Baclofen  dose reduced with concern for ileus.   Subjective: No nausea or vomiting.  Continues to have BM.  Oral intake is improving.  Physical Exam: General: in Mild distress, No Rash Cardiovascular: S1 and S2 Present, No Murmur Respiratory: Good respiratory effort, Bilateral Air entry present. No Crackles, No wheezes Abdomen: Bowel Sound present, abdomen softer than yesterday, no tenderness Extremities: No edema Neuro: Alert and oriented x3, no new focal deficit  Data Reviewed: I have Reviewed nursing notes, Vitals, and Lab results. Since last encounter, pertinent lab results CBC and BMP   . I have ordered test including CBC and BMP  .  Disposition: Status is: Inpatient Remains inpatient appropriate because: Medically stable.  Awaiting improvement in oral intake  Place and maintain sequential compression device Start: 02/26/24 1011 SCDs Start: 02/23/24 2056 Place TED hose Start: 02/23/24 2056   Family Communication: No one at bedside Level of care: Med-Surg   Vitals:   02/29/24 2041 03/01/24 0441 03/01/24 0812 03/01/24 1638  BP: 113/64 102/66 110/75 116/83  Pulse: (!) 110 95 93   Resp: 20 16 17 18   Temp: 100.3 F (37.9 C) 98 F (36.7 C) 98.7 F (37.1 C) 98 F (36.7 C)  TempSrc: Oral Oral    SpO2: 100% 93% 97% 97%  Weight:      Height:         Author: Charlean Congress, MD 03/01/2024 7:23 PM  Please look on [email or web address]  to find out who is on call.

## 2024-03-02 ENCOUNTER — Encounter (HOSPITAL_COMMUNITY): Payer: Self-pay | Admitting: Internal Medicine

## 2024-03-02 ENCOUNTER — Other Ambulatory Visit: Payer: Self-pay

## 2024-03-02 DIAGNOSIS — K5981 Ogilvie syndrome: Secondary | ICD-10-CM | POA: Diagnosis not present

## 2024-03-02 LAB — COMPREHENSIVE METABOLIC PANEL WITH GFR
ALT: 133 U/L — ABNORMAL HIGH (ref 0–44)
AST: 102 U/L — ABNORMAL HIGH (ref 15–41)
Albumin: 2.5 g/dL — ABNORMAL LOW (ref 3.5–5.0)
Alkaline Phosphatase: 61 U/L (ref 38–126)
Anion gap: 8 (ref 5–15)
BUN: 12 mg/dL (ref 6–20)
CO2: 21 mmol/L — ABNORMAL LOW (ref 22–32)
Calcium: 8.2 mg/dL — ABNORMAL LOW (ref 8.9–10.3)
Chloride: 106 mmol/L (ref 98–111)
Creatinine, Ser: 1.12 mg/dL (ref 0.61–1.24)
GFR, Estimated: >60 mL/min (ref >60)
Glucose, Bld: 274 mg/dL — ABNORMAL HIGH (ref 70–99)
Potassium: 4.2 mmol/L (ref 3.5–5.1)
Sodium: 135 mmol/L (ref 135–145)
Total Bilirubin: 0.4 mg/dL (ref 0.0–1.2)
Total Protein: 6.7 g/dL (ref 6.5–8.1)

## 2024-03-02 LAB — CBC WITH DIFFERENTIAL/PLATELET
Abs Immature Granulocytes: 0.04 10*3/uL (ref 0.00–0.07)
Basophils Absolute: 0 10*3/uL (ref 0.0–0.1)
Basophils Relative: 0 %
Eosinophils Absolute: 0.2 10*3/uL (ref 0.0–0.5)
Eosinophils Relative: 2 %
HCT: 35.6 % — ABNORMAL LOW (ref 39.0–52.0)
Hemoglobin: 12.1 g/dL — ABNORMAL LOW (ref 13.0–17.0)
Immature Granulocytes: 0 %
Lymphocytes Relative: 15 %
Lymphs Abs: 1.5 10*3/uL (ref 0.7–4.0)
MCH: 28 pg (ref 26.0–34.0)
MCHC: 34 g/dL (ref 30.0–36.0)
MCV: 82.4 fL (ref 80.0–100.0)
Monocytes Absolute: 0.8 10*3/uL (ref 0.1–1.0)
Monocytes Relative: 8 %
Neutro Abs: 7.3 10*3/uL (ref 1.7–7.7)
Neutrophils Relative %: 75 %
Platelets: 205 10*3/uL (ref 150–400)
RBC: 4.32 MIL/uL (ref 4.22–5.81)
RDW: 13.3 % (ref 11.5–15.5)
WBC: 9.8 10*3/uL (ref 4.0–10.5)
nRBC: 0 % (ref 0.0–0.2)

## 2024-03-02 LAB — MAGNESIUM: Magnesium: 2 mg/dL (ref 1.7–2.4)

## 2024-03-02 LAB — GLUCOSE, CAPILLARY
Glucose-Capillary: 149 mg/dL — ABNORMAL HIGH (ref 70–99)
Glucose-Capillary: 99 mg/dL (ref 70–99)
Glucose-Capillary: 124 mg/dL — ABNORMAL HIGH (ref 70–99)
Glucose-Capillary: 160 mg/dL — ABNORMAL HIGH (ref 70–99)

## 2024-03-02 MED ORDER — ENSURE ENLIVE PO LIQD
237.0000 mL | Freq: Two times a day (BID) | ORAL | 12 refills | Status: AC
  Filled 2024-03-02: qty 237, 1d supply, fill #0

## 2024-03-02 MED ORDER — SIMETHICONE 80 MG PO CHEW
80.0000 mg | CHEWABLE_TABLET | Freq: Three times a day (TID) | ORAL | 0 refills | Status: AC
  Filled 2024-03-02: qty 30, 8d supply, fill #0

## 2024-03-02 MED ORDER — METOCLOPRAMIDE HCL 5 MG PO TABS: 5.0000 mg | ORAL_TABLET | Freq: Three times a day (TID) | ORAL | 0 refills | Status: DC

## 2024-03-02 MED ORDER — SIMETHICONE 80 MG PO CHEW
80.0000 mg | CHEWABLE_TABLET | Freq: Three times a day (TID) | ORAL | Status: DC
  Administered 2024-03-02 – 2024-03-09 (×30): 80 mg via ORAL
  Filled 2024-03-02 (×30): qty 1

## 2024-03-02 MED ORDER — BACLOFEN 5 MG PO TABS: ORAL_TABLET | ORAL | 0 refills | Status: DC

## 2024-03-02 MED ORDER — POLYETHYLENE GLYCOL 3350 17 G PO PACK
17.0000 g | PACK | Freq: Every day | ORAL | 0 refills | Status: DC
  Filled 2024-03-02: qty 14, 14d supply, fill #0

## 2024-03-02 NOTE — Evaluation (Signed)
 Physical Therapy Evaluation Patient Details Name: Daniel Stuart MRN: [id number] DOB: 18-Jan-1966 Today's Date: 03/02/2024  History of Present Illness  58 y.o. resident at [facility] admitted 02/23/24 with abdominal pain and distention. Workup for ileus; plan for conservative management. PMH includes ICH (06/2023 w/ residual weakness, bedbound), CVA, chronic spasticity, progressive leukoencephalopathy, cortical blindness, HTN, HLD, CAD, PAD.   Clinical Impression  Pt presents with an overall decrease in functional mobility secondary to above. PTA, pt resident at [facility]; per facility and pt, working with therapies on standing transfers. Today, pt requiring max-totalA for bed mobility, tolerating prolonged sitting EOB with CGA-maxA to maintain balance. Pt demonstrates significant weakness, RLE spasticity, poor balance strategies, decreased activity tolerance and impaired cognition. Difficult to determine extent of true cognitive impairment vs fatigue vs decreased participation/communication (pt states, "I don't talk this much"). Pt would benefit from continued acute PT services to maximize functional mobility and decrease caregiver burden with return to SNF.       If plan is discharge home, recommend the following: Two people to help with walking and/or transfers;Two people to help with bathing/dressing/bathroom;Assistance with feeding;Assistance with cooking/housework   Can travel by private vehicle   No    Equipment Recommendations None recommended by PT (return to SNF)  Recommendations for Other Services       Functional Status Assessment Patient has had a recent decline in their functional status and demonstrates the ability to make significant improvements in function in a reasonable and predictable amount of time.     Precautions / Restrictions Precautions Precautions: Fall;Other (comment) Precaution/Restrictions Comments: h/o cortical blindness, spasticity      Mobility  Bed  Mobility Overal bed mobility: Needs Assistance Bed Mobility: Rolling, Supine to Sit, Sit to Supine Rolling: Max assist   Supine to sit: Max assist Sit to supine: Max assist, Total assist   General bed mobility comments: max verbal/tactile cues and increased time given for movement initiation, maxA for BLE managementr and trunk elevation; max-totalA for return to supine. maxA to roll R/L, pt able to maintain grip when RUE assisted to L bed rail requiring maxA to maintain sidelying    Transfers Overall transfer level: Needs assistance                 General transfer comment: will require lift equipment for OOB transfer    Ambulation/Gait                  Stairs            Wheelchair Mobility     Tilt Bed    Modified Rankin (Stroke Patients Only)       Balance Overall balance assessment: Needs assistance   Sitting balance-Leahy Scale: Poor Sitting balance - Comments: initial mod-maxA for static sitting balance, totalA to position BLEs/hips; brief bouts of static sitting with CGA, does not use BUEs to maintain balance. posterior LOB when perform seated ADL tasks (washing face)                                     Pertinent Vitals/Pain Pain Assessment Pain Assessment: Faces Faces Pain Scale: Hurts little more Pain Location: RLE; grimaces with other movements but does not localize pain when asked Pain Descriptors / Indicators: Grimacing, Guarding Pain Intervention(s): Monitored during session, Limited activity within patient's tolerance, Repositioned    Home Living Family/patient expects to be discharged to:: Skilled nursing facility  Additional Comments: reports being in SNF  for ~1.5 years    Prior Function Prior Level of Function : Needs assist;Patient poor historian/Family not available             Mobility Comments: pt reports he works with PT every day and stands with assist; pt later told OT he  requires totalA for mobility, lift for transfers. per pt's facility, pt working with therapies "to advance his transfers" and was standing in parallel bars ADLs Comments: pt reports he cannot feed self, requires assist for bed-level ADLs; pt later tells OT he can feed himself     Extremity/Trunk Assessment   Upper Extremity Assessment Upper Extremity Assessment: RUE deficits/detail;LUE deficits/detail;Difficult to assess due to impaired cognition RUE Deficits / Details: BUE decreased muscle tone, noted atrophy (including hands) with functional observed strength <3/5; active shoulder flex/abd <90'; h/o CVA and ICH RUE Coordination: decreased fine motor;decreased gross motor LUE Deficits / Details: BUE decreased muscle tone, noted atrophy (including hands) with functional observed strength <3/5; active shoulder flex/abd <90'; h/o CVA and ICH LUE Coordination: decreased fine motor;decreased gross motor    Lower Extremity Assessment Lower Extremity Assessment: RLE deficits/detail;LLE deficits/detail;Difficult to assess due to impaired cognition RLE Deficits / Details: RLE spasticity and tone, especially into R hip/knee flex, able to achieve partial RLE ext with PROM/AAROM; functional observed strength <3-/5 LLE Deficits / Details: functional observed strength <3/5       Communication   Communication Communication: Impaired Factors Affecting Communication: Reduced clarity of speech    Cognition Arousal: Alert Behavior During Therapy: Flat affect   PT - Cognitive impairments: History of cognitive impairments, Difficult to assess, Awareness, Attention, Initiation, Problem solving, Safety/Judgement, Memory Difficult to assess due to: Impaired communication                     PT - Cognition Comments: inconsistent PLOF reported and presentation of cognitive deficits compared to OT Evaluation. difficult to determine extent of true cognitive deficit vs desire to participate; pt states,  "I don't talk much" Following commands: Impaired Following commands impaired: Follows one step commands with increased time     Cueing Cueing Techniques: Verbal cues, Gestural cues, Tactile cues     General Comments General comments (skin integrity, edema, etc.): pt left in partial sidelying to L-side with pillow placed for pressure relief to buttocks/sacrum    Exercises     Assessment/Plan    PT Assessment Patient needs continued PT services  PT Problem List Decreased strength;Decreased range of motion;Decreased activity tolerance;Decreased balance;Decreased mobility;Decreased coordination;Decreased cognition;Decreased knowledge of use of DME;Impaired tone       PT Treatment Interventions DME instruction;Functional mobility training;Therapeutic activities;Therapeutic exercise;Balance training;Patient/family education;Wheelchair mobility training;Neuromuscular re-education    PT Goals (Current goals can be found in the Care Plan section)  Acute Rehab PT Goals Patient Stated Goal: none stated PT Goal Formulation: With patient Time For Goal Achievement: 03/16/24 Potential to Achieve Goals: Fair    Frequency Min 1X/week     Co-evaluation               AM-PAC PT "6 Clicks" Mobility  Outcome Measure Help needed turning from your back to your side while in a flat bed without using bedrails?: Total Help needed moving from lying on your back to sitting on the side of a flat bed without using bedrails?: Total Help needed moving to and from a bed to a chair (including a wheelchair)?: Total Help needed standing up from a chair  using your arms (e.g., wheelchair or bedside chair)?: Total Help needed to walk in hospital room?: Total Help needed climbing 3-5 steps with a railing? : Total 6 Click Score: 6    End of Session   Activity Tolerance: Other (comment) (limited by impaired cognition(?), also endorses fatigue) Patient left: in bed;with call bell/phone within reach;with  bed alarm set Nurse Communication: Mobility status;Other (comment) (NT present stating she will assist pt to eat breakfast; also requested pt have wash up) PT Visit Diagnosis: Other abnormalities of gait and mobility (R26.89);Muscle weakness (generalized) (M62.81);Other symptoms and signs involving the nervous system (R29.898)    Time: 1610-9604 PT Time Calculation (min) (ACUTE ONLY): 33 min   Charges:   PT Evaluation $PT Eval Moderate Complexity: 1 Mod PT Treatments $Therapeutic Activity: 8-22 mins PT General Charges $$ ACUTE PT VISIT: 1 Visit        Blase Bur, PT, DPT Acute Rehabilitation Services  Personal: Secure [facility] Rehab Office: [phone]  Albino Hum 03/02/2024, 3:00 PM

## 2024-03-02 NOTE — Progress Notes (Signed)
 [facility] Progress Note Patient: Daniel Stuart [id number]:[id number] DOB: [birth year] DOA: 02/23/2024  DOS: the patient was seen and examined on 03/02/2024  Brief Hospital Course: Patient with PMH of ICH with residual weakness and bedbound, progressive leukoencephalopathy, PAD/left ICA stenosis, cortical blindness, HLD, HTN, antiphospholipid syndrome.  Presented the hospital with complaints of abdominal pain and distention.  Treated for colonic ileus with pseudoobstruction.  GI was consulted.  General surgery was consulted as well.  Conservatively managed.  Was on TPN.  Now diet advanced. Continues to have minimal oral intake.  Assessment and Plan: Pseudoobstruction of colon. Presented with abdominal pain and distention. CT scan shows evidence of colonic distention without any obstruction.  Concern for ileus. General surgery was consulted.  NG tube was inserted.  Treated conservatively with IV fluid and bowel rest.  Was given TPN. GI was consulted as well.  As the ileus resolved patient was gradually advance to soft diet right now. GI has signed off.  General surgery has signed off. Patient has been chronically on baclofen .  There is a concern that that can lead to intestinal obstruction as well as ileus.  Will reduce the dose.  With recommendation to gradually taper it off over the next 2 weeks. Continues to have minimal oral intake therefore we will continue to monitor in the hospital.  LFT elevation. Etiology not clear. For now monitor.  Right upper extremity edema. Doppler negative.  Monitor.  Low-grade fever. Intermittently low-grade temp.  Tmax rarely above 101.  Was 102.7 on 4/28. Initially there was some concern for colitis and patient given 1 dose of Zosyn . For now we will monitor but if fever reoccurs would have low threshold to initiate antibiotic and repeat abdominal imaging.  Mild renal dysfunction.  No AKI. Mild hypokalemia mild hypomagnesemia. Baseline creatinine  appears to be around 1.2.  Serum creatinine on admission was 1.4.  Not meeting AKI criteria.  Treated with IV hydration. Potassium and magnesium  have been replaced.  Maintain K more than 4, mag more than 2.  Hyperglycemia. Seen incidentally while checking CBG for TPN. Hemoglobin A1c 5.8.  Naiping diagnosis of type II DM.   Continue sliding scale insulin .  Hypophosphatemia Corrected.  Monitor.  HLD. On statin.  Currently on hold.  Chronic spasticity. On baclofen .  On gabapentin . Currently continued.  Baclofen  dose reduced with concern for ileus.   Subjective: Denies any nausea or vomiting.  Frustrated of being in the hospital.  Although continues to have minimal oral intake.  Had a BM.  Physical Exam: In mild distress. S1-S2 present Generalized weakness and lethargy. Abdomen distended but not tense.  Nontender.  Bowel sound present.  Tympanic.  Data Reviewed: I have Reviewed nursing notes, Vitals, and Lab results. Reviewed CBC and CMP.  Reviewed her CBC and CMP.  Disposition: Status is: Inpatient Remains inpatient appropriate because: Monitor for improvement in oral intake.  Place and maintain sequential compression device Start: 02/26/24 1011 SCDs Start: 02/23/24 2056 Place TED hose Start: 02/23/24 2056   Family Communication: No one at bedside Level of care: Med-Surg   Vitals:   03/01/24 2023 03/02/24 1000 03/02/24 1551 03/02/24 1922  BP: 110/80 93/78 93/78  100/75  Pulse: (!) 105 93 (!) 102 99  Resp: 20 18 18 20   Temp: 98.8 F (37.1 C) 97.7 F (36.5 C) 98.4 F (36.9 C) 98.5 F (36.9 C)  TempSrc: Oral Oral Oral Oral  SpO2: 94%  98% 95%  Weight:      Height:  Author: Charlean Congress, MD 03/02/2024 7:36 PM  Please look on [email or web address] to find out who is on call.

## 2024-03-02 NOTE — Evaluation (Signed)
 Occupational Therapy Evaluation Patient Details Name: Daniel Stuart MRN: [id number] DOB: 1966-06-25 Today's Date: 03/02/2024   History of Present Illness   Daniel Stuart is a 58 y.o. male with medical history significant of intracranial hemorrhage in 06/2023 due to severe leukoencephalopathy, history of TIA/stroke/progressive leukoencephalopathy/chronic left ICA stenosis, cognitive impairment, cortical blindness, hyperlipidemia, peripheral vascular disease, chronic smoking cigarette, essential hypertension, chronic smoking cigarette, and antiphospholipid syndrome presented to emergency department from the complaining about generalized abdominal pain and distention.  Reported patient did not have any bowel movement for 3 days and not eating well.  Denies any nausea and vomiting. At the baseline patient is bedbound nonambulatory due to prior neurological deficit     Clinical Impressions PTA pt lives at a SNF as a LTC resident and reports that he requires total A with ADLs at baseline, except that he can feed himself and that staff uses lift to get him OOB. Pt currently requires min A with self feeding due to decreased strength, tone and coordination in hands so uncertain if pt report of Ind self feeding is accurate (if not then staff most likely assists him at SNF), max-total A for bed mobility/sup-sit with fluctuating max - min A for sitting balance, total A with bathing  dressing and toileting, total A to return to supine in bed and for positioning. Pt slow to respond to answer questions and follow commands. Pt is most likely at baseline level of function with ADLs/selfcare and no further acute OT services are indicated at this time. Acute OT services will sign off and defer any further OT intervention to his SNF upon return.      If plan is discharge home, recommend the following:   A lot of help with bathing/dressing/bathroom;Direct supervision/assist for medications management;Assistance  with feeding     Functional Status Assessment   Patient has not had a recent decline in their functional status     Equipment Recommendations   Hoyer lift;Wheelchair (measurements OT);Wheelchair cushion (measurements OT)     Recommendations for Other Services         Precautions/Restrictions   Precautions Precautions: Fall Restrictions Weight Bearing Restrictions Per Provider Order: No Other Position/Activity Restrictions: pt bedbound and non ambulatory at baseline per chart, lives at SNF     Mobility Bed Mobility Overal bed mobility: Needs Assistance Bed Mobility: Rolling, Supine to Sit, Sit to Supine Rolling: Max assist   Supine to sit: Total assist Sit to supine: Total assist   General bed mobility comments: cues for initiation, assist with LE mgt and trunk elevation. Pt with pain indicated in R knee. Pt required min - max A fluctuating for balance seated EOB.    Transfers                   General transfer comment: not addresses, pt reports SNF staff using lift for mobility/transfers      Balance Overall balance assessment: Needs assistance   Sitting balance-Leahy Scale: Poor Sitting balance - Comments: max - min A fluctuating seated EOB                                   ADL either performed or assessed with clinical judgement   ADL Overall ADL's : Needs assistance/impaired Eating/Feeding: Minimal assistance Eating/Feeding Details (indicate cue type and reason): to initiate grasp pf utensils, sequencing. Pt reports Ind with self feeding at SNF but uncertain this is accurate  Grooming: Supervision/safety;Wash/dry hands;Wash/dry face   Upper Body Bathing: Total assistance   Lower Body Bathing: Total assistance   Upper Body Dressing : Total assistance   Lower Body Dressing: Total assistance       Toileting- Clothing Manipulation and Hygiene: Total assistance;Bed level         General ADL Comments: Pt reports that he  requires total care with ADLs at baseline but that he does feed himself, SNF staff uses lift to get him OOB     Vision Baseline Vision/History: 1 Wears glasses Ability to See in Adequate Light: 0 Adequate Patient Visual Report: No change from baseline       Perception         Praxis         Pertinent Vitals/Pain Pain Assessment Pain Assessment: Faces Faces Pain Scale: Hurts a little bit Pain Location: generalized with bed mobility and R knee, did not state a specific location when asked multiple times Pain Descriptors / Indicators: Grimacing, Guarding Pain Intervention(s): Monitored during session, Limited activity within patient's tolerance, Repositioned     Extremity/Trunk Assessment Upper Extremity Assessment Upper Extremity Assessment: Generalized weakness;RUE deficits/detail;LUE deficits/detail RUE Deficits / Details: impaired muscle tone, atrophy in hands, hx CVA RUE Coordination: decreased fine motor;decreased gross motor LUE Deficits / Details: impaired muscle tone, atrophy in hands, hx CVA LUE Coordination: decreased fine motor;decreased gross motor   Lower Extremity Assessment Lower Extremity Assessment: Defer to PT evaluation       Communication Communication Communication: Impaired Factors Affecting Communication: Reduced clarity of speech   Cognition Arousal: Alert Behavior During Therapy: Flat affect Cognition: History of cognitive impairments, No family/caregiver present to determine baseline             OT - Cognition Comments: slow to respond to questions                 Following commands: Impaired Following commands impaired: Follows multi-step commands with increased time     Cueing  General Comments   Cueing Techniques: Verbal cues;Gestural cues;Tactile cues      Exercises     Shoulder Instructions      Home Living Family/patient expects to be discharged to:: Skilled nursing facility                                  Additional Comments: reports being in SNF  for ~1.5 years      Prior Functioning/Environment Prior Level of Function : Needs assist;Patient poor historian/Family not available             Mobility Comments: reports needing total A with use of lift for transfers ADLs Comments: reosts total A with bathing, dressing, toileting, able to feed self    OT Problem List: Decreased strength;Impaired balance (sitting and/or standing);Decreased cognition;Impaired tone;Pain;Decreased coordination;Decreased activity tolerance;Decreased range of motion;Decreased knowledge of use of DME or AE   OT Treatment/Interventions:        OT Goals(Current goals can be found in the care plan section)   Acute Rehab OT Goals Patient Stated Goal: none stated   OT Frequency:       Co-evaluation              AM-PAC OT "6 Clicks" Daily Activity     Outcome Measure Help from another person eating meals?: A Little Help from another person taking care of personal grooming?: A Little Help from another person toileting, which includes  using toliet, bedpan, or urinal?: Total Help from another person bathing (including washing, rinsing, drying)?: Total Help from another person to put on and taking off regular upper body clothing?: Total Help from another person to put on and taking off regular lower body clothing?: Total 6 Click Score: 10   End of Session Nurse Communication: Mobility status  Activity Tolerance: Patient limited by fatigue Patient left: in bed  OT Visit Diagnosis: Other abnormalities of gait and mobility (R26.89);Muscle weakness (generalized) (M62.81);Pain;Other symptoms and signs involving cognitive function Pain - Right/Left: Right Pain - part of body: Knee                Time: 1610-9604 OT Time Calculation (min): 19 min Charges:  OT General Charges $OT Visit: 1 Visit OT Evaluation $OT Eval Moderate Complexity: 1 Mod    Alfred Ann 03/02/2024, 12:29  PM

## 2024-03-02 NOTE — Inpatient Diabetes Management (Addendum)
 Inpatient Diabetes Program Recommendations  AACE/ADA: New Consensus Statement on Inpatient Glycemic Control (2015)  Target Ranges:  Prepandial:   less than 140 mg/dL      Peak postprandial:   less than 180 mg/dL (1-2 hours)      Critically ill patients:  140 - 180 mg/dL   Lab Results  Component Value Date   GLUCAP 149 (H) 03/02/2024   HGBA1C 5.8 (H) 03/01/2024    Review of Glycemic Control  Latest Reference Range & Units 03/01/24 08:13 03/01/24 12:13 03/01/24 16:38 03/01/24 20:25 03/02/24 08:41  Glucose-Capillary 70 - 99 mg/dL 010 (H) 272 (H) 536 (H) 292 (H) 149 (H)   Diabetes history: A1c Pre-Diabetes range 5.8% on 5/1  Current orders for Inpatient glycemic control:  Novolog  0-9 units tid + hs  Ensure Enlive bid between meals (40 grams of carbohydrates)  Inpatient Diabetes Program Recommendations:    Glucose trends increase after PO intake probably due to Ensure -    consider adding Novolog  2 units tid meal coverage if eating >50% of meals/supplements  Will speak with pt regarding monitoring at home and follow up.  Spoke with pt at bedside regarding A1c level. Encouraged pt to follow up with PCP and to monitor his glucose trends every so often. Informed him his glucose trends were higher in the hospital potentially for many reasons ad we will see if he needs an oral agent at time of discharge.  Thanks,  Eloise Hake RN, MSN, BC-ADM Inpatient Diabetes Coordinator Team Pager [phone] (8a-5p)

## 2024-03-02 NOTE — Progress Notes (Signed)
 Psychosocial Progressive/Outcome: ANOx2/3, calm and cooperative   Pain/Comfort Progression/Outcome: Pt in pain when turning in bed   Clinical Progression/Outcome: Poor diet intake  Turned and repositioned during shift  Voids to condom cath Had BM Dressings changed - D/C/I Slept in between care Maintained safety

## 2024-03-02 NOTE — Progress Notes (Signed)
 Pt has been drinking ensure drinks but has been refusing to eat meals. Ordered pt a dinner tray. Offered to feed pt and pt stated that he was not hungry. Pt stated that he always feel full but is not having any pain or discomfort in abdomen. Abdomen is distended and firm when palpating. Pt does not show any signs of pain when palpating abdomen. Educated the pt on the importance of eating.

## 2024-03-03 ENCOUNTER — Inpatient Hospital Stay (HOSPITAL_COMMUNITY)

## 2024-03-03 DIAGNOSIS — K5981 Ogilvie syndrome: Secondary | ICD-10-CM | POA: Diagnosis not present

## 2024-03-03 LAB — CBC WITH DIFFERENTIAL/PLATELET
Abs Immature Granulocytes: 0.06 10*3/uL (ref 0.00–0.07)
Basophils Absolute: 0 10*3/uL (ref 0.0–0.1)
Basophils Relative: 0 %
Eosinophils Absolute: 0.2 10*3/uL (ref 0.0–0.5)
Eosinophils Relative: 2 %
HCT: 35.4 % — ABNORMAL LOW (ref 39.0–52.0)
Hemoglobin: 11.8 g/dL — ABNORMAL LOW (ref 13.0–17.0)
Immature Granulocytes: 1 %
Lymphocytes Relative: 21 %
Lymphs Abs: 2 10*3/uL (ref 0.7–4.0)
MCH: 27.5 pg (ref 26.0–34.0)
MCHC: 33.3 g/dL (ref 30.0–36.0)
MCV: 82.5 fL (ref 80.0–100.0)
Monocytes Absolute: 0.7 10*3/uL (ref 0.1–1.0)
Monocytes Relative: 7 %
Neutro Abs: 6.6 10*3/uL (ref 1.7–7.7)
Neutrophils Relative %: 69 %
Platelets: 219 10*3/uL (ref 150–400)
RBC: 4.29 MIL/uL (ref 4.22–5.81)
RDW: 13.5 % (ref 11.5–15.5)
WBC: 9.5 10*3/uL (ref 4.0–10.5)
nRBC: 0 % (ref 0.0–0.2)

## 2024-03-03 LAB — COMPREHENSIVE METABOLIC PANEL WITH GFR
ALT: 118 U/L — ABNORMAL HIGH (ref 0–44)
AST: 72 U/L — ABNORMAL HIGH (ref 15–41)
Albumin: 2.5 g/dL — ABNORMAL LOW (ref 3.5–5.0)
Alkaline Phosphatase: 63 U/L (ref 38–126)
Anion gap: 10 (ref 5–15)
BUN: 11 mg/dL (ref 6–20)
CO2: 23 mmol/L (ref 22–32)
Calcium: 8.3 mg/dL — ABNORMAL LOW (ref 8.9–10.3)
Chloride: 103 mmol/L (ref 98–111)
Creatinine, Ser: 1.18 mg/dL (ref 0.61–1.24)
GFR, Estimated: >60 mL/min (ref >60)
Glucose, Bld: 114 mg/dL — ABNORMAL HIGH (ref 70–99)
Potassium: 4.1 mmol/L (ref 3.5–5.1)
Sodium: 136 mmol/L (ref 135–145)
Total Bilirubin: 0.6 mg/dL (ref 0.0–1.2)
Total Protein: 6.7 g/dL (ref 6.5–8.1)

## 2024-03-03 LAB — GLUCOSE, CAPILLARY
Glucose-Capillary: 101 mg/dL — ABNORMAL HIGH (ref 70–99)
Glucose-Capillary: 112 mg/dL — ABNORMAL HIGH (ref 70–99)
Glucose-Capillary: 93 mg/dL (ref 70–99)
Glucose-Capillary: 165 mg/dL — ABNORMAL HIGH (ref 70–99)

## 2024-03-03 LAB — MAGNESIUM: Magnesium: 1.9 mg/dL (ref 1.7–2.4)

## 2024-03-03 NOTE — Plan of Care (Signed)

## 2024-03-03 NOTE — Progress Notes (Signed)
 [facility] Progress Note Patient: Daniel Stuart YQM:[id number] DOB: 08-30-1966 DOA: 02/23/2024  DOS: the patient was seen and examined on 03/03/2024  Brief Hospital Course: As per prior documentation: "Patient with PMH of ICH with residual weakness and bedbound, progressive leukoencephalopathy, PAD/left ICA stenosis, cortical blindness, HLD, HTN, antiphospholipid syndrome.  Presented the hospital with complaints of abdominal pain and distention.  Treated for colonic ileus with pseudoobstruction.  GI was consulted.  General surgery was consulted as well.  Conservatively managed.  Was on TPN.  Now diet advanced. Continues to have minimal oral intake".  03/03/2024: Patient seen.  Patient is not a particularly good historian.  Discussed with patient's nurse extensively.  Small oral intake.  Will proceed with caloric count.  Will consult dietary team.  Gaseous distention of the abdomen noted.  Will proceed with abdominal x-ray.  Assessment and Plan: Pseudoobstruction of colon. Presented with abdominal pain and distention. CT scan shows evidence of colonic distention without any obstruction.  Concern for ileus. General surgery was consulted.  NG tube was inserted.  Treated conservatively with IV fluid and bowel rest.  Was given TPN. GI was consulted as well.  As the ileus resolved patient was gradually advance to soft diet right now. GI has signed off.  General surgery has signed off. Patient has been chronically on baclofen .  There is a concern that that can lead to intestinal obstruction as well as ileus.  Will reduce the dose.  With recommendation to gradually taper it off over the next 2 weeks. Continues to have minimal oral intake therefore we will continue to monitor in the hospital. 03/03/2024: Proceed with calorie count.  Dietary consult.  Abdominal x-ray.   LFT elevation. Etiology not clear. For now monitor.  Right upper extremity edema. Doppler negative.  Monitor.  Low-grade  fever. Intermittently low-grade temp.  Tmax rarely above 101.  Was 102.7 on 4/28. Initially there was some concern for colitis and patient given 1 dose of Zosyn . For now we will monitor but if fever reoccurs would have low threshold to initiate antibiotic and repeat abdominal imaging. 03/03/2024: Resolved.  Mild renal dysfunction.  No AKI. Mild hypokalemia mild hypomagnesemia. Baseline creatinine appears to be around 1.2.  Serum creatinine on admission was 1.4.  Not meeting AKI criteria.  Treated with IV hydration. Potassium and magnesium  have been replaced.  Maintain K more than 4, mag more than 2. 03/03/2024: Potassium is 4.1 today.  Serum creatinine is 1.18.  AST of 72 and ALT of 118.  eGFR is greater than 60 mL/min per 1.73 m.  Hyperglycemia. Seen incidentally while checking CBG for TPN. Hemoglobin A1c 5.8.  Naiping diagnosis of type II DM.   Continue sliding scale insulin . 03/03/2024: Blood sugars controlled.  Hypophosphatemia Corrected.  Monitor. 03/03/2024: Last phosphorus of 2.7.  Repeat phosphorus in the morning.  HLD. On statin.  Currently on hold.  Chronic spasticity. On baclofen .  On gabapentin . Currently continued.  Baclofen  dose reduced with concern for ileus.   Subjective: Patient is a poor historian. Physical Exam: General Condition: Chronically ill looking.  Not in any distress.   Abdomen: Distended and tense.  Mildly hyperactive bowel sounds. CVS: S1-S2 present   Data Reviewed: I have Reviewed nursing notes, Vitals, and Lab results. Reviewed CBC and CMP.  Reviewed her CBC and CMP.  Disposition: Status is: Inpatient Remains inpatient appropriate because: Monitor for improvement in oral intake.  Place and maintain sequential compression device Start: 02/26/24 1011 SCDs Start: 02/23/24 2056 Place TED hose Start:  02/23/24 2056   Family Communication: No one at bedside Level of care: Med-Surg   Vitals:   03/02/24 1551 03/02/24 1922 03/03/24 0500 03/03/24 0918   BP: 93/78 100/75 94/69 92/72   Pulse: (!) 102 99 91 88  Resp: 18 20 19 17   Temp: 98.4 F (36.9 C) 98.5 F (36.9 C) 98 F (36.7 C) 98.1 F (36.7 C)  TempSrc: Oral Oral Oral Oral  SpO2: 98% 95% 95% 93%  Weight:      Height:         Author: Doroteo Gasmen, MD 03/03/2024 11:58 AM  Please look on [email or web address] to find out who is on call.

## 2024-03-03 NOTE — NC FL2 (Signed)
 [place]  MEDICAID FL2 LEVEL OF CARE FORM     IDENTIFICATION  Patient Name: Daniel Stuart Birthdate: 20-Jun-1966 Sex: male Admission Date (Current Location): 02/23/2024  [place] [facility] and [place] Number:  Producer, television/film/video and Address:  [facility]. [facility], [address] [place], [place] [address]      Provider Number: [id number]  Attending Physician Name and Address:  Doroteo Gasmen, MD  Relative Name and Phone Number:  Tory Freiberg (Sister) [phone]    Current Level of Care: Hospital Recommended Level of Care: Skilled Nursing Facility Prior Approval Number:    Date Approved/Denied:   PASRR Number: [id number]  Discharge Plan: SNF    Current Diagnoses: Patient Active Problem List   Diagnosis Date Noted   Edema of right upper arm 02/28/2024   Protein calorie malnutrition (HCC) 02/27/2024   Abnormal CT scan, colon 02/27/2024   Pressure injury of skin 02/24/2024   Generalized abdominal pain 02/24/2024   Colonic pseudoobstruction 02/23/2024   Colitis 02/23/2024   Hypokalemia 02/23/2024   AKI (acute kidney injury) (HCC) 02/23/2024   History of intracranial hemorrhage 02/23/2024   Essential hypertension 02/23/2024   Antiphospholipid syndrome (HCC) 02/23/2024   Left carotid artery occlusion 09/09/2022   Weakness of both lower extremities 09/07/2022   Bedbound 08/25/2022   PAD (peripheral artery disease) (HCC) 08/25/2022   Arrhythmia as indication for cardiac pacemaker replacement 03/11/2020   Urinary frequency 03/11/2020   Vitamin D  deficiency 09/13/2019   History of CVA (cerebrovascular accident) 03/12/2019   Right sided weakness 03/12/2019   Anxiety 03/12/2019   Hyperlipidemia    Tobacco abuse 04/12/2017   Dyslipidemia 04/12/2017   PVD (peripheral vascular disease) (HCC) 04/12/2017   Carotid arterial disease (HCC) 04/12/2017   CVA (cerebral vascular accident) (HCC) 04/11/2017    Orientation RESPIRATION BLADDER Height & Weight      Self, Place  Normal Incontinent, External catheter Weight: 132 lb 4.4 oz (60 kg) Height:  5\' 6"  (167.6 cm)  BEHAVIORAL SYMPTOMS/MOOD NEUROLOGICAL BOWEL NUTRITION STATUS      Continent Diet (See dc summary)  AMBULATORY STATUS COMMUNICATION OF NEEDS Skin   Extensive Assist Verbally PU Stage and Appropriate Care (Stage II on buttocks and ankle; MASD on scrotum)                       Personal Care Assistance Level of Assistance  Bathing, Feeding, Dressing Bathing Assistance: Maximum assistance Feeding assistance: Limited assistance Dressing Assistance: Maximum assistance     Functional Limitations Info  Sight Sight Info: Impaired Hearing Info: Adequate Speech Info: Adequate    SPECIAL CARE FACTORS FREQUENCY  PT (By licensed PT), OT (By licensed OT)     PT Frequency: Eval and treat OT Frequency: Eval and treat            Contractures Contractures Info: Not present    Additional Factors Info  Code Status, Allergies, Insulin  Sliding Scale Code Status Info: Full Allergies Info: NKA   Insulin  Sliding Scale Info: See dc summary       Current Medications (03/03/2024):  This is the current hospital active medication list Current Facility-Administered Medications  Medication Dose Route Frequency Provider Last Rate Last Admin   acetaminophen  (TYLENOL ) tablet 650 mg  650 mg Oral Q6H PRN Sundil, Subrina, MD   650 mg at 02/28/24 8413   Or   acetaminophen  (TYLENOL ) suppository 650 mg  650 mg Rectal Q6H PRN Sundil, Subrina, MD       baclofen  (LIORESAL )  tablet 5 mg  5 mg Oral TID Patel, Pranav M, MD   5 mg at 03/03/24 1610   Chlorhexidine  Gluconate Cloth 2 % PADS 6 each  6 each Topical Daily Unk Garb, DO   6 each at 03/03/24 0934   feeding supplement (ENSURE ENLIVE / ENSURE PLUS) liquid 237 mL  237 mL Oral BID BM Unk Garb, DO   237 mL at 03/03/24 9604   gabapentin  (NEURONTIN ) capsule 300 mg  300 mg Oral BID Sundil, Subrina, MD   300 mg at 03/03/24 5409   insulin  aspart  (novoLOG ) injection 0-5 Units  0-5 Units Subcutaneous QHS Patel, Pranav M, MD   3 Units at 03/01/24 2259   insulin  aspart (novoLOG ) injection 0-9 Units  0-9 Units Subcutaneous TID WC Patel, Pranav M, MD   1 Units at 03/02/24 1033   metoCLOPramide  (REGLAN ) tablet 5 mg  5 mg Oral TID AC Patel, Pranav M, MD   5 mg at 03/03/24 8119   Oral care mouth rinse  15 mL Mouth Rinse 4 times per day Sundil, Subrina, MD   15 mL at 03/03/24 1478   Oral care mouth rinse  15 mL Mouth Rinse PRN Sundil, Subrina, MD       pantoprazole  (PROTONIX ) EC tablet 40 mg  40 mg Oral Daily Unk Garb, DO   40 mg at 03/03/24 0928   polyethylene glycol (MIRALAX  / GLYCOLAX ) packet 17 g  17 g Oral Daily Patel, Pranav M, MD   17 g at 03/01/24 1016   simethicone  (MYLICON) chewable tablet 80 mg  80 mg Oral TID AC & HS Patel, Pranav M, MD   80 mg at 03/03/24 2956   sodium chloride  flush (NS) 0.9 % injection 10-40 mL  10-40 mL Intracatheter Q12H Unk Garb, DO   10 mL at 03/03/24 2130   sodium chloride  flush (NS) 0.9 % injection 10-40 mL  10-40 mL Intracatheter PRN Unk Garb, DO       sodium chloride  flush (NS) 0.9 % injection 3 mL  3 mL Intravenous Q12H Sundil, Subrina, MD   3 mL at 03/03/24 8657   sodium chloride  flush (NS) 0.9 % injection 3 mL  3 mL Intravenous PRN Sundil, Subrina, MD   3 mL at 02/29/24 8469     Discharge Medications: Please see discharge summary for a list of discharge medications.  Relevant Imaging Results:  Relevant Lab Results:   Additional Information SSN: [id number]  Newell Wafer S Chriss Mannan, LCSW

## 2024-03-04 DIAGNOSIS — K5981 Ogilvie syndrome: Secondary | ICD-10-CM | POA: Diagnosis not present

## 2024-03-04 LAB — GLUCOSE, CAPILLARY
Glucose-Capillary: 99 mg/dL (ref 70–99)
Glucose-Capillary: 161 mg/dL — ABNORMAL HIGH (ref 70–99)
Glucose-Capillary: 85 mg/dL (ref 70–99)
Glucose-Capillary: 113 mg/dL — ABNORMAL HIGH (ref 70–99)

## 2024-03-04 MED ORDER — POLYETHYLENE GLYCOL 3350 17 G PO PACK
17.0000 g | PACK | Freq: Two times a day (BID) | ORAL | Status: DC
  Administered 2024-03-04 – 2024-03-09 (×10): 17 g via ORAL
  Filled 2024-03-04 (×10): qty 1

## 2024-03-04 MED ORDER — BACLOFEN 10 MG PO TABS
5.0000 mg | ORAL_TABLET | Freq: Two times a day (BID) | ORAL | Status: AC
  Administered 2024-03-05 (×2): 5 mg via ORAL
  Filled 2024-03-04 (×3): qty 1

## 2024-03-04 NOTE — Plan of Care (Signed)

## 2024-03-04 NOTE — Progress Notes (Signed)
 [facility] Progress Note Patient: Daniel Stuart DTO:[id number] DOB: [birth year] DOA: 02/23/2024  DOS: the patient was seen and examined on 03/04/2024  Brief Hospital Course: Patient with PMH of ICH with residual weakness and bedbound, progressive leukoencephalopathy, PAD/left ICA stenosis, cortical blindness, HLD, HTN, antiphospholipid syndrome.  Presented the hospital with complaints of abdominal pain and distention.  Treated for colonic ileus with pseudoobstruction.  GI was consulted.  General surgery was consulted as well.  Conservatively managed.  Was on TPN.  Now diet advanced.  Also continues to have poor p.o. intake.  Assessment and Plan: Pseudoobstruction of colon. Presented with abdominal pain and distention. CT scan shows evidence of colonic distention without any obstruction.  Concern for ileus. General surgery was consulted.  NG tube was inserted. Treated conservatively with IV fluid and bowel rest.   Was given TPN while he had bowel rest. GI was consulted as well.   As the ileus resolved patient was gradually advance to soft diet right now. GI has signed off.  General surgery has signed off. Patient has been chronically on baclofen .  There is a concern that that can lead to intestinal obstruction as well as ileus.  Recommended patient to gradually taper this medication off.  Right upper extremity edema. Doppler negative.  Monitor.  Low-grade fever. Intermittently low-grade temp. Tmax 102.7 on 4/28. No further fever in the last 48 hours. Monitor clinically.  Mild renal dysfunction.  No AKI. Mild hypokalemia mild hypomagnesemia. Baseline creatinine appears to be around 1.2. Serum creatinine on admission was 1.4. Not meeting AKI criteria. Treated with IV hydration. Potassium and magnesium  have been replaced.   Maintain K more than 4, mag more than 2.  Hyperglycemia. Resolved. Hemoglobin A1c 5.8.   Hypophosphatemia Corrected.  Monitor.  HLD. On statin.   Currently on hold.  Chronic spasticity. On baclofen .  On gabapentin . Continue gabapentin . Tapering of baclofen  baclofen  dose due to with concern for ileus.  Cerebral myeloid angiopathy. History of intracerebral hemorrhage. Progressive leukoencephalopathy. Spastic hemiparesis of right side Sees neurology outpatient. Awaiting anticoagulation, antiplatelet medication recommending good blood pressure control.  Goal below 130/90.   Subjective: No nausea no vomiting no fever no chills.  Abdomen more distended today than prior.  No BM so far.  Physical Exam: General: in Mild distress, No Rash Cardiovascular: S1 and S2 Present, No Murmur Respiratory: Good respiratory effort, Bilateral Air entry present. No Crackles, No wheezes Abdomen: Bowel Sound tympanic, distended without any tenderness Extremities: No edema Neuro: Alert and oriented x3, no new focal deficit  Data Reviewed: I have Reviewed nursing notes, Vitals, and Lab results. I have ordered test including BMP and magnesium   .   Disposition: Status is: Inpatient Remains inpatient appropriate because: Monitor for improvement in abdominal distention and oral intake  Place and maintain sequential compression device Start: 02/26/24 1011 SCDs Start: 02/23/24 2056 Place TED hose Start: 02/23/24 2056   Family Communication: No one at bedside Level of care: Med-Surg   Vitals:   03/03/24 1933 03/04/24 0357 03/04/24 0741 03/04/24 1630  BP: 95/74 103/75 116/69 93/64  Pulse: 88 81 89 89  Resp: 16 16 17 17   Temp: 98.2 F (36.8 C) 98 F (36.7 C)    TempSrc: Oral Oral    SpO2: 96% 94% 98% 93%  Weight:      Height:         Author: Charlean Congress, MD 03/04/2024 5:04 PM  Please look on [email or web address] to find out who is on call.

## 2024-03-05 DIAGNOSIS — K567 Ileus, unspecified: Principal | ICD-10-CM

## 2024-03-05 DIAGNOSIS — K5981 Ogilvie syndrome: Secondary | ICD-10-CM | POA: Diagnosis not present

## 2024-03-05 DIAGNOSIS — R140 Abdominal distension (gaseous): Secondary | ICD-10-CM

## 2024-03-05 LAB — CBC
HCT: 36.8 % — ABNORMAL LOW (ref 39.0–52.0)
Hemoglobin: 12.3 g/dL — ABNORMAL LOW (ref 13.0–17.0)
MCH: 27.6 pg (ref 26.0–34.0)
MCHC: 33.4 g/dL (ref 30.0–36.0)
MCV: 82.7 fL (ref 80.0–100.0)
Platelets: 291 10*3/uL (ref 150–400)
RBC: 4.45 MIL/uL (ref 4.22–5.81)
RDW: 13.4 % (ref 11.5–15.5)
WBC: 11.4 10*3/uL — ABNORMAL HIGH (ref 4.0–10.5)
nRBC: 0 % (ref 0.0–0.2)

## 2024-03-05 LAB — BASIC METABOLIC PANEL WITH GFR
Anion gap: 11 (ref 5–15)
BUN: 14 mg/dL (ref 6–20)
CO2: 23 mmol/L (ref 22–32)
Calcium: 8.5 mg/dL — ABNORMAL LOW (ref 8.9–10.3)
Chloride: 100 mmol/L (ref 98–111)
Creatinine, Ser: 1.11 mg/dL (ref 0.61–1.24)
GFR, Estimated: >60 mL/min (ref >60)
Glucose, Bld: 101 mg/dL — ABNORMAL HIGH (ref 70–99)
Potassium: 3.6 mmol/L (ref 3.5–5.1)
Sodium: 134 mmol/L — ABNORMAL LOW (ref 135–145)

## 2024-03-05 LAB — GLUCOSE, CAPILLARY
Glucose-Capillary: 101 mg/dL — ABNORMAL HIGH (ref 70–99)
Glucose-Capillary: 164 mg/dL — ABNORMAL HIGH (ref 70–99)
Glucose-Capillary: 116 mg/dL — ABNORMAL HIGH (ref 70–99)
Glucose-Capillary: 91 mg/dL (ref 70–99)

## 2024-03-05 LAB — PHOSPHORUS: Phosphorus: 3.6 mg/dL (ref 2.5–4.6)

## 2024-03-05 LAB — MAGNESIUM: Magnesium: 2.1 mg/dL (ref 1.7–2.4)

## 2024-03-05 MED ORDER — POTASSIUM CHLORIDE 10 MEQ/100ML IV SOLN
10.0000 meq | INTRAVENOUS | Status: AC
  Administered 2024-03-05 (×4): 10 meq via INTRAVENOUS
  Filled 2024-03-05 (×4): qty 100

## 2024-03-05 MED ORDER — SMOG ENEMA
400.0000 mL | Freq: Every day | RECTAL | Status: DC
  Administered 2024-03-06: 400 mL via RECTAL
  Filled 2024-03-05 (×2): qty 960

## 2024-03-05 NOTE — Plan of Care (Signed)

## 2024-03-05 NOTE — Progress Notes (Addendum)
 Patient ID: Daniel Stuart, male   DOB: 1966-01-10, 58 y.o.   MRN: [id number]     Attending physician's note   I have taken a history, reviewed the chart, and examined the patient. I performed a substantive portion of this encounter, including complete performance of at least one of the key components, in conjunction with the APP. I agree with the APP's note, impression, and recommendations with my edits.  Recurrent abdominal distention with concern for colonic ileus.  Exam today with distended, tympanic abdomen but no TTP. +BS.  -Previous CT 4/24 with concern for ischemic injury near hepatic flexure.  Plan for repeat CT for reevaluation - Trial smog enema - Depending on response, may consider Relistor.  Although not a defined contraindication, would consider neostigmine cautiously given his advanced CNS history  - Daily electrolytes with repletion per protocol - GI service to continue to follow  Karter Haire, DO, FACG [phone] office          Progress Note   Subjective  Day # 11 CC; we are asked to reconsult on this patient regarding persistent colonic ileus.  Patient with cerebral myeloid angiopathy, history of intracranial hemorrhage, progressive leukoencephalopathy and spastic right hemiparesis on the right side-on chronic baclofen  for spasticity Also with peripheral arterial disease/left ICA stenosis, cortical blindness and antiphospholipid syndrome Last seen by GI on 4/28-at that time felt to be improving as he had had a large bowel movement He apparently has had a persistently distended abdomen since that time with some concern for gradual worsening, and poor p.o. intake. This admission he required an NG tube and TPN, now on a regular diet but says he does not have any appetite. He denies any abdominal pain or sensation of tightness, no nausea or vomiting, per nursing just had a smear of stool within the past 24 hours  Last KUB 03/03/2024 shows diffuse gaseous  distention of the colon CT abdomen and pelvis on 4/25-showed diffusely dilated colon with air-fluid levels and some mild circumferential wall thickening of the ascending colon with anterior and peripheral bubbly lucencies in the ascending colon question related to fecal content could not rule out pneumatosis, no free air  Labs today WBC 11.4/hemoglobin 12.3/hematocrit 36.8 Potassium 3.6/BUN 14/creatinine 1.11   Objective   Vital signs in last 24 hours: Temp:  [98.5 F (36.9 C)-98.8 F (37.1 C)] 98.5 F (36.9 C) (05/05 0414) Pulse Rate:  [78-89] 84 (05/05 0806) Resp:  [16-20] 17 (05/05 0806) BP: (93-108)/(64-81) 108/81 (05/05 0806) SpO2:  [88 %-93 %] 88 % (05/05 0806) Last BM Date : 03/05/24 (smear) General: Older African-American male in NAD speech appropriate but slow Heart:  Regular rate and rhythm; no murmurs Lungs: Respirations even and unlabored, lungs CTA bilaterally Abdomen: Very distended and tympanitic bowel sounds are active, no focal tenderness Extremities:  Without edema. Neurologic:  Alert and orientedx2    Psych:  Cooperative. Normal mood and affect.  Intake/Output from previous day: 05/04 0701 - 05/05 0700 In: 537 [P.O.:537] Out: 1150 [Urine:1150] Intake/Output this shift: Total I/O In: 480 [P.O.:480] Out: -   Lab Results: Recent Labs    03/03/24 0355 03/05/24 0222  WBC 9.5 11.4*  HGB 11.8* 12.3*  HCT 35.4* 36.8*  PLT 219 291   BMET Recent Labs    03/03/24 0355 03/05/24 0222  NA 136 134*  K 4.1 3.6  CL 103 100  CO2 23 23  GLUCOSE 114* 101*  BUN 11 14  CREATININE 1.18 1.11  CALCIUM  8.3* 8.5*  LFT Recent Labs    03/03/24 0355  PROT 6.7  ALBUMIN 2.5*  AST 72*  ALT 118*  ALKPHOS 63  BILITOT 0.6   PT/INR No results for input(s): "LABPROT", "INR" in the last 72 hours.       Assessment / Plan:    #44 58 year old African-American male with previous history of intracranial hemorrhage with residual right-sided weakness, bedbound and  with progressive leukoencephalopathy, cerebral myeloid angiopathy.  #2 persistent colonic ileus/question pseudoobstruction-seen earlier this admission by our service, had felt to be improving and last seen 4/28 Apparently has had persistent abdominal distention since that time.  His diet has been able to be advanced but has had poor oral intake, no vomiting No good bowel movements over the past few days..  Patient does not have sense of abdominal pain  CT on admission with some concern for wall thickening in the ascending colon with associated bubbles/lucencies  #3 malnutrition/multifactoral  Plan; changed back to clear liquid diet CT abdomen and pelvis with contrast today needs reassessed for colonic wall thickening/ischemia Have ordered smog enema x 1 this afternoon and 1 in a.m. Depending on results of CT will determine if appropriate candidate for Relistor  or if decompression indicated Continue potassium add magnesium  replacement to keep levels within normal range. GI will follow with you        Principal Problem:   Colonic pseudoobstruction Active Problems:   Hyperlipidemia   History of CVA (cerebrovascular accident)   Bedbound   Colitis   Hypokalemia   AKI (acute kidney injury) (HCC)   History of intracranial hemorrhage   Essential hypertension   Pressure injury of skin   Generalized abdominal pain   Protein calorie malnutrition (HCC)   Abnormal CT scan, colon   Edema of right upper arm     LOS: 11 days   Amy Esterwood PA-C 03/05/2024, 3:21 PM

## 2024-03-05 NOTE — Progress Notes (Incomplete)
 Nutrition Follow-up  DOCUMENTATION CODES:   Not applicable  INTERVENTION:   Encourage PO intake House trays until patient's mental status improves, still seemed confused on visit Feeding assistance  Boost Breeze po TID, each supplement provides 250 kcal and 9 grams of protein Prosource Plus BID, each supplement provides 100 kcal and 15 grams of protein  Multivitamin with minerals. 1000 units vitamin D  daily for 6-8 weeks for noted deficiency 1000 mcg Vitamin B-12 daily for noted deficiency   If patient remains on CLD, recommend restarting TPN   When diet advanced to FLD or above recommend: Ensure Enlive po BID, each supplement provides 350 kcal and 20 grams of protein. Magic Cup with meals, each supplement provides 290 kcal and 9 grams of protein  NUTRITION DIAGNOSIS:   Inadequate oral intake related to inability to eat as evidenced by NPO status.  - Progressing, on SOFT diet   GOAL:   Patient will meet greater than or equal to 90% of their needs  - Ongoing   MONITOR:   Diet advancement, Skin, Labs  REASON FOR ASSESSMENT:   Consult Other (Comment), Assessment of nutrition requirement/status (ileus, colitis, AKI)  ASSESSMENT:   Pt is from [facility]. C/o abdominal distention and lack of BM x3 days. No oral intake in three days. Facility concerned for bowel obstruction and sent pt via [facility] for assessment. PMH significant for: ICH (06/2023), TIA/stroke/progressive leukoencephalopathy/chronic left ICA stenosis, cognitive impairment, HLD, PVD, HTN.  4/24 admitted 4/24 CT abd: diffuse dilation of colon w/ air-fluid levels worrisome for colonic ileus, mild wall thickening of transverse compatible w/ colitis, ischemia?; patchy ground-glass opacities in bilateral lower lobes worrisome for infection or inflammation 4/26 NGT placed for decompression 4/29 - NGT removed 4/30 - TPN discontinued, FLD started  5/2 - Soft diet, thin liquids  5/5 - CLD 5/6 - CT scan  for colonic wall thickening/ischemia   Per RN patient has been improving in his mental status and PO intake however, he is still a poor historian. Patient denies any abdominal pain, nausea, or vomiting. Does report  having a poor appetite. On exam abdomen still feels distended. Per nursing, night shift RN gave enema and had large type 7 BM during the night.  Calorie count ordered per MD, patient was meeting 50-60% of his estimated needs while on a soft diet. Now on CLD due to concern for colonic wall thickening/ischemia. Per GI pending on CT scan may be candidate for Relistor or may need decompression again.   RN provided updated bed weight of 72.3 kg. Question accuracy of most recent weights as weight on admission seems to be carried over from prior admission and abdominal distention may be effecting current weight.  Since patient on CLD will switch to Boost breeze and add Prosource Plus. If patient continues to be on CLD recommend restarting TPN.   Calorie Count Note  48 hour calorie count ordered.  Diet: Soft diet, thin liquids  Supplements: Ensure Enlive, Magic cups   5/4: Refused all meals Breakfast: 0% Lunch: 0% Dinner: 0% Snacks: 180 kcal, 2 gm protein  Supplements: 2 Ensures, 700 kcal, 40 gm protein   Total intake: 880 kcal (55% of minimum estimated needs)  42 gm protein (56% of minimum estimated needs)  5/5: Breakfast: 50%, 300 kcal, 10 gm protein  Lunch: 50%, 325 kcal, 15 gm protein  Dinner: 0% Supplements: 1 Ensures, 350 kcal, 20 gm protein   Total intake: 975 kcal (61% of minimum estimated needs)  45 gm protein (  60% of minimum estimated needs)  48 hour average: Total intake: 927 kcal (58% of minimum estimated needs)  43 gm protein (57% of minimum estimated needs)  Admit weight: 60 kg - Carried over from prior admission? Current weight: 72.3 kg  Average Meal Intake: 5/1-5/5: 26% intake x 8 recorded meals  Nutritionally Relevant Medications: Scheduled Meds:   (feeding supplement) PROSource Plus  30 mL Oral BID BM   Chlorhexidine  Gluconate Cloth  6 each Topical Daily   feeding supplement  1 Container Oral TID BM   gabapentin   300 mg Oral BID   insulin  aspart  0-5 Units Subcutaneous QHS   insulin  aspart  0-9 Units Subcutaneous TID WC   metoCLOPramide   5 mg Oral TID AC   mouth rinse  15 mL Mouth Rinse 4 times per day   pantoprazole   40 mg Oral Daily   polyethylene glycol  17 g Oral BID   simethicone   80 mg Oral TID AC & HS   sodium chloride  flush  10-40 mL Intracatheter Q12H   sodium chloride  flush  3 mL Intravenous Q12H   SMOG  400 mL Rectal Daily   Labs Reviewed: Potassium 3.3, Calcium  8.5, AST 72, ALT 118, Vitamin D  26.6, Vitamin B-12  CBG ranges from 81-164 mg/dL over the last 24 hours HgbA1c 5.8  Diet Order:   Diet Order             Diet clear liquid Room service appropriate? No; Fluid consistency: Thin  Diet effective now           DIET SOFT                   EDUCATION NEEDS:   Not appropriate for education at this time  Skin:  Skin Assessment: Skin Integrity Issues: Skin Integrity Issues:: Stage II Stage II: L buttocks, R ankle  Last BM:  03/06/2024 - type 7, large  Height:   Ht Readings from Last 1 Encounters:  02/23/24 5\' 6"  (1.676 m)    Weight:   Wt Readings from Last 1 Encounters:  03/06/24 72.3 kg    Ideal Body Weight:  64.5 kg  BMI:  Body mass index is 25.73 kg/m.  Estimated Nutritional Needs:   Kcal:  1700-1900 kcal  Protein:  90-110 gm  Fluid:  >1.7L/day   Frederik Jansky, RD Registered Dietitian  See Amion for more information

## 2024-03-05 NOTE — Progress Notes (Signed)
 [facility] Progress Note Patient: Daniel Stuart:[id number] DOB: June 04, 1966 DOA: 02/23/2024  DOS: the patient was seen and examined on 03/05/2024  Brief Hospital Course: Patient with PMH of ICH with residual weakness and bedbound, progressive leukoencephalopathy, PAD/left ICA stenosis, cortical blindness, HLD, HTN, antiphospholipid syndrome.  Presented the hospital with complaints of abdominal pain and distention.  Treated for colonic ileus with pseudoobstruction.  GI was consulted.  General surgery was consulted as well.  Conservatively managed.  Was on TPN.  Now diet advanced.  Also continues to have poor p.o. intake.  Assessment and Plan: Pseudoobstruction of colon. Presented with abdominal pain and distention. CT scan shows evidence of colonic distention without any obstruction.  Concern for ileus. General surgery was consulted.  NG tube was inserted. Treated conservatively with IV fluid and bowel rest.   Was given TPN while he had bowel rest. GI was consulted as well.   As the ileus resolved patient was gradually advance to soft diet right now. GI has signed off.  General surgery has signed off. GI reconsulted.  Small enema ordered.  Repeat CT recommended.  Monitor for Patient has been chronically on baclofen .  There is a concern that that can lead to intestinal obstruction as well as ileus.  Recommended patient to gradually taper this medication off.  Right upper extremity edema. Doppler negative.  Monitor.  Low-grade fever. Intermittently low-grade temp. Tmax 102.7 on 4/28. No further fever in the last 48 hours. Monitor clinically.  Mild renal dysfunction.  No AKI. Mild hypokalemia mild hypomagnesemia. Baseline creatinine appears to be around 1.2. Serum creatinine on admission was 1.4. Not meeting AKI criteria. Treated with IV hydration. Potassium and magnesium  have been replaced.   Maintain K more than 4, mag more than 2.  Hyperglycemia. Resolved. Hemoglobin  A1c 5.8.   Hypophosphatemia Corrected.  Monitor.  HLD. On statin.  Currently on hold.  Chronic spasticity. On baclofen .  On gabapentin . Continue gabapentin . Tapering of baclofen  baclofen  dose due to with concern for ileus.  Cerebral myeloid angiopathy. History of intracerebral hemorrhage. Progressive leukoencephalopathy. Spastic hemiparesis of right side Sees neurology outpatient. Awaiting anticoagulation, antiplatelet medication recommending good blood pressure control.  Goal below 130/90.   Subjective: No nausea or vomiting.  Ate well.  But abdomen remains distended.  No vomiting.  Physical Exam: General: in Mild distress, No Rash Cardiovascular: S1 and S2 Present, No Murmur Respiratory: Good respiratory effort, Bilateral Air entry present. No Crackles, No wheezes Abdomen: Bowel Sound present, distended, no significant tenderness Extremities: No edema Neuro: Alert and oriented x3, no new focal deficit  Data Reviewed: I have Reviewed nursing notes, Vitals, and Lab results. Since last encounter, pertinent lab results CBC and BMP   . I have ordered test including BMP and magnesium   . I have discussed pt's care plan and test results with GI  .   Disposition: Status is: Inpatient Remains inpatient appropriate because: Monitor for improvement in abdominal distention  Place and maintain sequential compression device Start: 02/26/24 1011 SCDs Start: 02/23/24 2056 Place TED hose Start: 02/23/24 2056   Family Communication: No one at bedside Level of care: Med-Surg   Vitals:   03/05/24 0414 03/05/24 0806 03/05/24 1715 03/05/24 1920  BP: 96/67 108/81 99/78 98/77   Pulse: 78 84 89 93  Resp: 16 17 17 18   Temp: 98.5 F (36.9 C)   99.2 F (37.3 C)  TempSrc: Oral   Oral  SpO2: 92% (!) 88% 91% 92%  Weight:      Height:  Author: Charlean Congress, MD 03/05/2024 7:58 PM  Please look on [email or web address] to find out who is on call.

## 2024-03-05 NOTE — Progress Notes (Signed)
 Administration of iohexol  started at 2155.

## 2024-03-06 ENCOUNTER — Inpatient Hospital Stay (HOSPITAL_COMMUNITY)

## 2024-03-06 DIAGNOSIS — K567 Ileus, unspecified: Secondary | ICD-10-CM | POA: Diagnosis not present

## 2024-03-06 DIAGNOSIS — R140 Abdominal distension (gaseous): Secondary | ICD-10-CM | POA: Diagnosis not present

## 2024-03-06 DIAGNOSIS — R933 Abnormal findings on diagnostic imaging of other parts of digestive tract: Secondary | ICD-10-CM | POA: Diagnosis not present

## 2024-03-06 DIAGNOSIS — K5981 Ogilvie syndrome: Secondary | ICD-10-CM | POA: Diagnosis not present

## 2024-03-06 LAB — BASIC METABOLIC PANEL WITH GFR
Anion gap: 10 (ref 5–15)
BUN: 9 mg/dL (ref 6–20)
CO2: 22 mmol/L (ref 22–32)
Calcium: 8.5 mg/dL — ABNORMAL LOW (ref 8.9–10.3)
Chloride: 103 mmol/L (ref 98–111)
Creatinine, Ser: 1.11 mg/dL (ref 0.61–1.24)
GFR, Estimated: >60 mL/min (ref >60)
Glucose, Bld: 105 mg/dL — ABNORMAL HIGH (ref 70–99)
Potassium: 3.3 mmol/L — ABNORMAL LOW (ref 3.5–5.1)
Sodium: 135 mmol/L (ref 135–145)

## 2024-03-06 LAB — GLUCOSE, CAPILLARY
Glucose-Capillary: 81 mg/dL (ref 70–99)
Glucose-Capillary: 73 mg/dL (ref 70–99)
Glucose-Capillary: 94 mg/dL (ref 70–99)
Glucose-Capillary: 92 mg/dL (ref 70–99)

## 2024-03-06 LAB — MAGNESIUM: Magnesium: 2.1 mg/dL (ref 1.7–2.4)

## 2024-03-06 MED ORDER — VITAMIN B-12 1000 MCG PO TABS
1000.0000 ug | ORAL_TABLET | Freq: Every day | ORAL | Status: DC
  Administered 2024-03-06 – 2024-03-09 (×4): 1000 ug via ORAL
  Filled 2024-03-06 (×4): qty 1

## 2024-03-06 MED ORDER — IOHEXOL 350 MG/ML SOLN
75.0000 mL | Freq: Once | INTRAVENOUS | Status: AC | PRN
  Administered 2024-03-06: 75 mL via INTRAVENOUS

## 2024-03-06 MED ORDER — PROSOURCE PLUS PO LIQD
30.0000 mL | Freq: Two times a day (BID) | ORAL | Status: DC
  Administered 2024-03-06 – 2024-03-07 (×4): 30 mL via ORAL
  Filled 2024-03-06 (×4): qty 30

## 2024-03-06 MED ORDER — SMOG ENEMA
400.0000 mL | Freq: Two times a day (BID) | RECTAL | Status: AC
  Filled 2024-03-06 (×2): qty 960

## 2024-03-06 MED ORDER — VITAMIN D 25 MCG (1000 UNIT) PO TABS
1000.0000 [IU] | ORAL_TABLET | Freq: Every day | ORAL | Status: DC
  Administered 2024-03-06 – 2024-03-09 (×4): 1000 [IU] via ORAL
  Filled 2024-03-06 (×4): qty 1

## 2024-03-06 MED ORDER — POTASSIUM CHLORIDE 10 MEQ/100ML IV SOLN
10.0000 meq | INTRAVENOUS | Status: AC
  Administered 2024-03-06 (×6): 10 meq via INTRAVENOUS
  Filled 2024-03-06 (×6): qty 100

## 2024-03-06 MED ORDER — METHYLNALTREXONE BROMIDE 12 MG/0.6ML ~~LOC~~ SOLN
12.0000 mg | Freq: Once | SUBCUTANEOUS | Status: DC
  Filled 2024-03-06 (×2): qty 0.6

## 2024-03-06 MED ORDER — BOOST / RESOURCE BREEZE PO LIQD CUSTOM
1.0000 | Freq: Three times a day (TID) | ORAL | Status: DC
  Administered 2024-03-06 – 2024-03-07 (×5): 1 via ORAL

## 2024-03-06 NOTE — Progress Notes (Signed)
Patient transported down to CT

## 2024-03-06 NOTE — Progress Notes (Addendum)
 Patient ID: Daniel Stuart, male   DOB: [birth year], 58 y.o.   MRN: [id number]     Attending physician's note   I have taken a history, reviewed the chart, and examined the patient. I performed a substantive portion of this encounter, including complete performance of at least one of the key components, in conjunction with the APP. I agree with the APP's note, impression, and recommendations with my edits.   Abdominal exam much improved today.  Less distended, soft, less tympanic.  Tolerating full liquids without issue.  Reviewed CT findings which show distention of the colon consistent with colonic ileus, but no evidence of the previously noted colonic pneumatosis.  - Repeat smog enema this evening and in the morning given good response - Will trial [employer] - Continue daily electrolytes with repletion per protocol  Daniel Vida, DO, FACG [phone] office         Progress Note   Subjective   Day # 13 CC; persistent Colonic ileus  CT abd/pelvis yesterday -moderate gaseous distention of the colon diffusely with scattered air-fluid levels, stomach and small bowel decompressed.  Overall gaseous distention of the colon prior to similar study previously seen pneumatosis in the ascending colon no longer visualized.  Patient says his abdomen feels less distended today, he was not having pain previously but seems to feel more comfortable, did get enema this morning and says he passed stool and gas.  He is tolerating liquids and says he is not really hungry, no nausea or vomiting    Objective   Vital signs in last 24 hours: Temp:  [99.2 F (37.3 C)-99.5 F (37.5 C)] 99.5 F (37.5 C) (05/06 0413) Pulse Rate:  [76-93] 78 (05/06 0744) Resp:  [16-18] 16 (05/06 0413) BP: (98-105)/(65-78) 105/69 (05/06 0744) SpO2:  [91 %-97 %] 97 % (05/06 0744) Weight:  [72.3 kg] 72.3 kg (05/06 0902) Last BM Date : 03/06/24 General:    Older African-American male in NAD Heart:  Regular rate  and rhythm; no murmurs Lungs: Respirations even and unlabored, lungs CTA bilaterally Abdomen: Still quite distended but softer than yesterday, bowel sounds more normoactive, no focal tenderness, still tympanitic.  Extremities:  Without edema. Neurologic:  Alert and oriented,  grossly normal neurologically. Psych:  Cooperative. Normal mood and affect.  Intake/Output from previous day: 05/05 0701 - 05/06 0700 In: 1600 [P.O.:1600] Out: 800 [Urine:800] Intake/Output this shift: No intake/output data recorded.  Lab Results: Recent Labs    03/05/24 0222  WBC 11.4*  HGB 12.3*  HCT 36.8*  PLT 291   BMET Recent Labs    03/05/24 0222 03/06/24 0434  NA 134* 135  K 3.6 3.3*  CL 100 103  CO2 23 22  GLUCOSE 101* 105*  BUN 14 9  CREATININE 1.11 1.11  CALCIUM  8.5* 8.5*   LFT No results for input(s): "PROT", "ALBUMIN", "AST", "ALT", "ALKPHOS", "BILITOT", "BILIDIR", "IBILI" in the last 72 hours. PT/INR No results for input(s): "LABPROT", "INR" in the last 72 hours.  Studies/Results: CT ABDOMEN PELVIS W WO CONTRAST Result Date: 03/06/2024 CLINICAL DATA:  Abdominal pain, distention. Bowel obstruction suspected persistent ileus , compare to prior Ct EXAM: CT ABDOMEN AND PELVIS WITHOUT AND WITH CONTRAST TECHNIQUE: Multidetector CT imaging of the abdomen and pelvis was performed following the standard protocol before and following the bolus administration of intravenous contrast. RADIATION DOSE REDUCTION: This exam was performed according to the departmental dose-optimization program which includes automated exposure control, adjustment of the mA and/or kV according to  patient size and/or use of iterative reconstruction technique. CONTRAST:  75mL OMNIPAQUE  IOHEXOL  350 MG/ML SOLN COMPARISON:  02/23/2024 FINDINGS: Lower chest: Bibasilar patchy airspace opacities, similar to prior study. No effusions. Hepatobiliary: No focal hepatic abnormality. Gallbladder unremarkable. Pancreas: No focal  abnormality or ductal dilatation. Spleen: No focal abnormality.  Normal size. Adrenals/Urinary Tract: Bilateral nephrolithiasis. No ureteral stones or hydronephrosis. Urinary bladder and adrenal glands unremarkable. Stomach/Bowel: Moderate gaseous distension of the colon diffusely with scattered air-fluid levels. Stomach and small bowel decompressed. Appendix is normal. Overall gaseous distention of the colon is similar to prior study. The previously seen pneumatosis in the ascending colon no longer visualized. Vascular/Lymphatic: Aortic atherosclerosis. No evidence of aneurysm or adenopathy. Reproductive: No visible focal abnormality. Other: No free fluid or free air. Musculoskeletal: No acute bony abnormality. IMPRESSION: Continued gaseous distention of the colon with scattered air-fluid levels, similar prior study, most consistent with colonic ileus. Resolution of the previously seen ascending colonic pneumatosis. Nonobstructing bilateral renal stones. Aortic atherosclerosis. Stable patchy ground-glass opacities in the lower lobes bilaterally concerning for pneumonia. Electronically Signed   By: Daniel Stuart M.D.   On: 03/06/2024 01:05       Assessment / Plan:    #29 58 year old African-American male with previous history of intracranial hemorrhage with residual right-sided weakness, bedbound, and with progressive leukoencephalopathy/cerebral myeloid angiopathy.  #2 persistent colonic ileus Repeat  CT yesterday shows persistent diffuse colonic ileus but no critically dilated bowel and previously noted pneumatosis on prior CT has resolved(he may have had a mild ischemic injury)  Is responding to smog enemas, abdomen softer today and bowel sounds more normal active  #3 malnutrition/multifactoral #4 hypokalemia-needs replaced and need to keep potassium -in normal range to optimize gut function  Plan; advance to full liquid diet Give another smog enema this evening and again in a.m. Start trial of  Relistor  Continue potassium replacement per primary service     Principal Problem:   Colonic pseudoobstruction Active Problems:   Hyperlipidemia   History of CVA (cerebrovascular accident)   Bedbound   Colitis   Hypokalemia   AKI (acute kidney injury) (HCC)   History of intracranial hemorrhage   Essential hypertension   Pressure injury of skin   Generalized abdominal pain   Protein calorie malnutrition (HCC)   Abnormal CT scan, colon   Edema of right upper arm   Ileus (HCC)   Abdominal distention     LOS: 12 days   Amy EsterwoodPA-C  03/06/2024, 3:21 PM

## 2024-03-06 NOTE — Plan of Care (Signed)

## 2024-03-06 NOTE — Progress Notes (Signed)
 Patient returned from CT

## 2024-03-06 NOTE — Progress Notes (Signed)
 [facility] Progress Note Patient: Daniel Stuart UJW:[id number] DOB: 19-Jul-1966 DOA: 02/23/2024  DOS: the patient was seen and examined on 03/06/2024  Brief Hospital Course: Patient with PMH of ICH with residual weakness and bedbound, progressive leukoencephalopathy, PAD/left ICA stenosis, cortical blindness, HLD, HTN, antiphospholipid syndrome.  Presented the hospital with complaints of abdominal pain and distention.  Treated for colonic ileus with pseudoobstruction.  GI was consulted.  General surgery was consulted as well.  Conservatively managed.  Was on TPN.  Diet was advanced although continues to have significant colonic distention and therefore GI was reconsulted.  Currently undergoing smog enema and Relistor with treatment.  Assessment and Plan: Pseudoobstruction of colon. Presented with abdominal pain and distention. CT is "significant colonic distention concerning for ileus. General surgery was consulted.  NG tube was inserted. Treated conservatively with IV fluid and bowel rest.   Was given TPN while he had bowel rest. GI was consulted as well.   As the ileus resolved patient was gradually advance to soft diet.  GI has signed off.  General surgery has signed off. GI reconsulted due to persistent abdominal distention. Initiated on enema again. Repeat CT scan does not show any acute abnormality. Will stop baclofen  as it can cause colonic ileus and obstruction. Initiated on Relistor. Was also treated with Reglan .  Right upper extremity edema. Doppler negative.  Monitor.  Low-grade fever. Intermittently low-grade temp. Tmax 102.7 on 4/28. No further fever in the last 48 hours. Monitor clinically.  Mild renal dysfunction.  No AKI. Mild hypokalemia mild hypomagnesemia. Baseline creatinine appears to be around 1.2. Serum creatinine on admission was 1.4. Not meeting AKI criteria. Treated with IV hydration. Potassium and magnesium  have been replaced.   Maintain K more  than 4, mag more than 2.  Hyperglycemia. Resolved. Hemoglobin A1c 5.8.   Hypophosphatemia Corrected.  Monitor.  HLD. On statin.  Currently on hold.  Chronic spasticity. On baclofen .  On gabapentin . Continue gabapentin . Tapering of baclofen  baclofen  dose due to with concern for ileus.  Cerebral myeloid angiopathy. History of intracerebral hemorrhage. Progressive leukoencephalopathy. Spastic hemiparesis of right side Sees neurology outpatient. Awaiting anticoagulation, antiplatelet medication recommending good blood pressure control.  Goal below 130/90.   Subjective: No nausea no vomiting.  No fever no chills.  Abdominal appears to be less distended.  Physical Exam: General: in Mild distress, No Rash Cardiovascular: S1 and S2 Present, No Murmur Respiratory: Good respiratory effort, Bilateral Air entry present. No Crackles, No wheezes Abdomen: Bowel Sound present, distended, nontympanic sound, no tenderness Extremities: No edema Neuro: Alert and oriented x3, no new focal deficit  Data Reviewed: I have Reviewed nursing notes, Vitals, and Lab results. Since last encounter, pertinent lab results CBC and BMP   . I have ordered test including BMP and magnesium   .   Disposition: Status is: Inpatient Remains inpatient appropriate because: Monitor improvement in abdominal distention.  Place and maintain sequential compression device Start: 02/26/24 1011 SCDs Start: 02/23/24 2056 Place TED hose Start: 02/23/24 2056   Family Communication: No one at bedside Level of care: Med-Surg   Vitals:   03/06/24 0413 03/06/24 0744 03/06/24 0902 03/06/24 1649  BP: 100/65 105/69  100/67  Pulse: 76 78  79  Resp: 16     Temp: 99.5 F (37.5 C)   98 F (36.7 C)  TempSrc: Oral   Oral  SpO2: 93% 97%  94%  Weight:   72.3 kg   Height:         Author: Charlean Congress,  MD 03/06/2024 6:50 PM  Please look on [email or web address] to find out who is on call.

## 2024-03-06 NOTE — Progress Notes (Signed)
 Patient completed both bottles of iohexol  at 23:47. CT notified

## 2024-03-07 ENCOUNTER — Inpatient Hospital Stay (HOSPITAL_COMMUNITY)

## 2024-03-07 DIAGNOSIS — K567 Ileus, unspecified: Secondary | ICD-10-CM | POA: Diagnosis not present

## 2024-03-07 DIAGNOSIS — E46 Unspecified protein-calorie malnutrition: Secondary | ICD-10-CM | POA: Diagnosis not present

## 2024-03-07 DIAGNOSIS — K5981 Ogilvie syndrome: Secondary | ICD-10-CM | POA: Diagnosis not present

## 2024-03-07 LAB — GLUCOSE, CAPILLARY
Glucose-Capillary: 106 mg/dL — ABNORMAL HIGH (ref 70–99)
Glucose-Capillary: 97 mg/dL (ref 70–99)
Glucose-Capillary: 166 mg/dL — ABNORMAL HIGH (ref 70–99)
Glucose-Capillary: 163 mg/dL — ABNORMAL HIGH (ref 70–99)

## 2024-03-07 LAB — BASIC METABOLIC PANEL WITH GFR
Anion gap: 7 (ref 5–15)
BUN: 9 mg/dL (ref 6–20)
CO2: 23 mmol/L (ref 22–32)
Calcium: 8.3 mg/dL — ABNORMAL LOW (ref 8.9–10.3)
Chloride: 107 mmol/L (ref 98–111)
Creatinine, Ser: 1.08 mg/dL (ref 0.61–1.24)
GFR, Estimated: >60 mL/min (ref >60)
Glucose, Bld: 102 mg/dL — ABNORMAL HIGH (ref 70–99)
Potassium: 3.8 mmol/L (ref 3.5–5.1)
Sodium: 137 mmol/L (ref 135–145)

## 2024-03-07 LAB — MAGNESIUM: Magnesium: 2 mg/dL (ref 1.7–2.4)

## 2024-03-07 MED ORDER — ALTEPLASE 2 MG IJ SOLR
2.0000 mg | Freq: Once | INTRAMUSCULAR | Status: AC
  Administered 2024-03-07: 2 mg
  Filled 2024-03-07: qty 2

## 2024-03-07 MED ORDER — METHYLNALTREXONE BROMIDE 12 MG/0.6ML ~~LOC~~ SOLN
12.0000 mg | Freq: Once | SUBCUTANEOUS | Status: AC
  Administered 2024-03-07: 12 mg via SUBCUTANEOUS

## 2024-03-07 MED ORDER — POTASSIUM CHLORIDE 20 MEQ PO PACK
40.0000 meq | PACK | Freq: Once | ORAL | Status: AC
  Administered 2024-03-07: 40 meq via ORAL
  Filled 2024-03-07: qty 2

## 2024-03-07 NOTE — Progress Notes (Signed)
 Physical Therapy Treatment Patient Details Name: Daniel Stuart MRN: [id number] DOB: May 28, 1966 Today's Date: 03/07/2024   History of Present Illness 58 y.o. resident at [facility] admitted 02/23/24 with abdominal pain and distention. Workup for ileus; plan for conservative management. PMH includes ICH (06/2023 w/ residual weakness, bedbound), CVA, chronic spasticity, progressive leukoencephalopathy, cortical blindness, HTN, HLD, CAD, PAD.    PT Comments  Pt anxious with any movement. Likely will do better in familiar environment of facility. Recommend return to facility at prior level of care.     If plan is discharge home, recommend the following: Two people to help with walking and/or transfers;Two people to help with bathing/dressing/bathroom;Assistance with feeding;Assistance with cooking/housework   Can travel by private vehicle     No  Equipment Recommendations  None recommended by PT    Recommendations for Other Services       Precautions / Restrictions Precautions Precautions: Fall;Other (comment) Recall of Precautions/Restrictions: Impaired Precaution/Restrictions Comments: h/o cortical blindness, spasticity Restrictions Weight Bearing Restrictions Per Provider Order: No Other Position/Activity Restrictions: pt bedbound and non ambulatory at baseline per chart, lives at SNF and working with therapy on standing     Mobility  Bed Mobility Overal bed mobility: Needs Assistance Bed Mobility: Supine to Sit, Sit to Supine     Supine to sit: +2 for physical assistance, Total assist Sit to supine: +2 for physical assistance, Total assist   General bed mobility comments: Assist for all aspects    Transfers                   General transfer comment: Attempted with Stedy but unable to attempt stand due to leaning in sitting.    Ambulation/Gait                   Stairs             Wheelchair Mobility     Tilt Bed    Modified Rankin  (Stroke Patients Only)       Balance Overall balance assessment: Needs assistance Sitting-balance support: Single extremity supported, No upper extremity supported Sitting balance-Leahy Scale: Poor Sitting balance - Comments: Mod to max for static sitting due to posterior  and lt lean. Pt does not put hands down on bed for support. Postural control: Posterior lean, Left lateral lean                                  Communication Communication Communication: Impaired Factors Affecting Communication: Difficulty expressing self;Reduced clarity of speech  Cognition Arousal: Alert Behavior During Therapy: Anxious   PT - Cognitive impairments: History of cognitive impairments, Difficult to assess, Awareness, Attention, Initiation, Problem solving, Safety/Judgement, Memory                       PT - Cognition Comments: Pt with limited verbalizations Following commands: Impaired Following commands impaired: Follows one step commands inconsistently, Follows one step commands with increased time    Cueing Cueing Techniques: Verbal cues, Gestural cues, Tactile cues  Exercises      General Comments        Pertinent Vitals/Pain Pain Assessment Pain Assessment: Faces Faces Pain Scale: Hurts even more Pain Location: cries out with movements. Initially denied pain and then reported pain but didn't localize. Pain Descriptors / Indicators: Guarding, Grimacing, Moaning Pain Intervention(s): Limited activity within patient's tolerance, Repositioned, Monitored during session  Home Living                          Prior Function            PT Goals (current goals can now be found in the care plan section) Acute Rehab PT Goals Patient Stated Goal: none stated Progress towards PT goals: Not progressing toward goals - comment    Frequency    Min 1X/week      PT Plan      Co-evaluation              AM-PAC PT "6 Clicks" Mobility    Outcome Measure  Help needed turning from your back to your side while in a flat bed without using bedrails?: Total Help needed moving from lying on your back to sitting on the side of a flat bed without using bedrails?: Total Help needed moving to and from a bed to a chair (including a wheelchair)?: Total Help needed standing up from a chair using your arms (e.g., wheelchair or bedside chair)?: Total Help needed to walk in hospital room?: Total Help needed climbing 3-5 steps with a railing? : Total 6 Click Score: 6    End of Session   Activity Tolerance: Patient limited by pain Patient left: in bed;with call bell/phone within reach;with bed alarm set   PT Visit Diagnosis: Other abnormalities of gait and mobility (R26.89);Muscle weakness (generalized) (M62.81);Other symptoms and signs involving the nervous system (R29.898)     Time: 1610-9604 PT Time Calculation (min) (ACUTE ONLY): 16 min  Charges:    $Therapeutic Activity: 8-22 mins PT General Charges $$ ACUTE PT VISIT: 1 Visit                     [facility] PT Acute Rehabilitation Services Office [phone]    Pura Browns [facility] 03/07/2024, 1:53 PM

## 2024-03-07 NOTE — Plan of Care (Signed)

## 2024-03-07 NOTE — Progress Notes (Addendum)
 Patient ID: Daniel Stuart, male   DOB: 1966-09-16, 58 y.o.   MRN: [id number]     Attending physician's note   I have taken a history, reviewed the chart, and examined the patient. I performed a substantive portion of this encounter, including complete performance of at least one of the key components, in conjunction with the APP. I agree with the APP's note, impression, and recommendations with my edits.   Overall improving clinically. Has had several looser stools since starting scheduled smog enemas. Placed order for Relistor yesterday, but apparently that was not approved by pharmacy, so never administered.  - Abdominal x-ray today - Will hold smog enemas for the time being - Continue MiraLAX  - Would ideally like to give Relistor to facilitate continued bowel movements  [name], DO, FACG [phone] office          Progress Note   Subjective   Day # 13 CC; persistent colonic ileus  Relistor x 1 yesterday  Patient says his abdomen is feeling better, he denies any abdominal pain or discomfort no nausea.  Currently on a full liquid diet, tray at bedside he is unable to feed himself. He has been getting smog enemas twice daily and has had good result  Labs this a.m.-potassium 3.8/BUN 9/creatinine 1.08 Magnesium  2.0   Objective   Vital signs in last 24 hours: Temp:  [97.6 F (36.4 C)-98.2 F (36.8 C)] 97.6 F (36.4 C) (05/07 0810) Pulse Rate:  [79-93] 93 (05/07 0810) Resp:  [17-18] 18 (05/07 0810) BP: (93-113)/(67-76) 113/73 (05/07 0810) SpO2:  [94 %-100 %] 95 % (05/07 0810) Last BM Date : 03/06/24 General:    Older African-American male in NAD alert and appropriate but responses slow Heart:  Regular rate and rhythm; no murmurs Lungs: Respirations even and unlabored, lungs CTA bilaterally Abdomen: Softer, stools tympanitic though less distended, nontender, bowel sounds more normoactive Extremities:  Without edema. Neurologic:  Alert and oriented,  right-sided weakness Psych:  Cooperative. Normal mood and affect.  Intake/Output from previous day: 05/06 0701 - 05/07 0700 In: 555.6 [IV Piggyback:555.6] Out: -  Intake/Output this shift: No intake/output data recorded.  Lab Results: Recent Labs    03/05/24 0222  WBC 11.4*  HGB 12.3*  HCT 36.8*  PLT 291   BMET Recent Labs    03/05/24 0222 03/06/24 0434 03/07/24 0409  NA 134* 135 137  K 3.6 3.3* 3.8  CL 100 103 107  CO2 23 22 23   GLUCOSE 101* 105* 102*  BUN 14 9 9   CREATININE 1.11 1.11 1.08  CALCIUM  8.5* 8.5* 8.3*   LFT No results for input(s): "PROT", "ALBUMIN", "AST", "ALT", "ALKPHOS", "BILITOT", "BILIDIR", "IBILI" in the last 72 hours. PT/INR No results for input(s): "LABPROT", "INR" in the last 72 hours.  Studies/Results: CT ABDOMEN PELVIS W WO CONTRAST Result Date: 03/06/2024 CLINICAL DATA:  Abdominal pain, distention. Bowel obstruction suspected persistent ileus , compare to prior Ct EXAM: CT ABDOMEN AND PELVIS WITHOUT AND WITH CONTRAST TECHNIQUE: Multidetector CT imaging of the abdomen and pelvis was performed following the standard protocol before and following the bolus administration of intravenous contrast. RADIATION DOSE REDUCTION: This exam was performed according to the departmental dose-optimization program which includes automated exposure control, adjustment of the mA and/or kV according to patient size and/or use of iterative reconstruction technique. CONTRAST:  75mL OMNIPAQUE  IOHEXOL  350 MG/ML SOLN COMPARISON:  02/23/2024 FINDINGS: Lower chest: Bibasilar patchy airspace opacities, similar to prior study. No effusions. Hepatobiliary: No focal hepatic abnormality. Gallbladder  unremarkable. Pancreas: No focal abnormality or ductal dilatation. Spleen: No focal abnormality.  Normal size. Adrenals/Urinary Tract: Bilateral nephrolithiasis. No ureteral stones or hydronephrosis. Urinary bladder and adrenal glands unremarkable. Stomach/Bowel: Moderate gaseous  distension of the colon diffusely with scattered air-fluid levels. Stomach and small bowel decompressed. Appendix is normal. Overall gaseous distention of the colon is similar to prior study. The previously seen pneumatosis in the ascending colon no longer visualized. Vascular/Lymphatic: Aortic atherosclerosis. No evidence of aneurysm or adenopathy. Reproductive: No visible focal abnormality. Other: No free fluid or free air. Musculoskeletal: No acute bony abnormality. IMPRESSION: Continued gaseous distention of the colon with scattered air-fluid levels, similar prior study, most consistent with colonic ileus. Resolution of the previously seen ascending colonic pneumatosis. Nonobstructing bilateral renal stones. Aortic atherosclerosis. Stable patchy ground-glass opacities in the lower lobes bilaterally concerning for pneumonia. Electronically Signed   By: Janeece Mechanic M.D.   On: 03/06/2024 01:05       Assessment / Plan:    #73 58 year old African-American male with prior history of intracranial hemorrhage with residual right-sided weakness, currently bedbound and with progressive leukoencephalopathy/cerebral myeloid angiopathy  #2 persistent colonic ileus present since admission Repeat CT on 03/05/2024 did show persistent diffuse colonic ileus, no critically dilated bowel and previously noted pneumatosis from CT earlier this admission had resolved.  He is currently on MiraLAX  twice daily, and has been receiving smog enemas twice daily over the past 2 days. He definitely has had improvement in his abdominal distention  Added Relistor yesterday x 1  #3 hypokalemia corrected #4 malnutrition-multifactoral  Plan will check plain abdominal films today Stop smog enemas Continue MiraLAX  twice daily Give another dose of Relistor today If abdominal films are improved may be able to advance him back up to a solid diet.    Principal Problem:   Colonic pseudoobstruction Active Problems:    Hyperlipidemia   History of CVA (cerebrovascular accident)   Bedbound   Colitis   Hypokalemia   AKI (acute kidney injury) (HCC)   History of intracranial hemorrhage   Essential hypertension   Pressure injury of skin   Generalized abdominal pain   Protein calorie malnutrition (HCC)   Abnormal CT scan, colon   Edema of right upper arm   Ileus (HCC)   Abdominal distention     LOS: 13 days   Amy EsterwoodPA-C  03/07/2024, 12:43 PM

## 2024-03-07 NOTE — Progress Notes (Signed)
 Progress Note    Daniel Stuart   OVF:[id number]  DOB: [birth year]  DOA: 02/23/2024     13 PCP: Jacquetta Mattocks, [name]  Initial CC: abd pain/distension   Hospital Course: [name] is a 58 yo male with PMH ICH with residual right weakness and bedbound, progressive leukoencephalopathy, PAD/left ICA stenosis, cortical blindness, HLD, HTN, antiphospholipid syndrome.  Presented to the hospital with complaints of abdominal pain and distention.  Treated for colonic ileus with pseudoobstruction.  GI was consulted.  General surgery was consulted as well.  Conservatively managed.  Was on TPN.  Now diet advanced.  Also continues to have poor p.o. intake.  Assessment and Plan: Pseudoobstruction of colon Presented with abdominal pain and distention. CT scan shows evidence of colonic distention without any obstruction.  Concern for ileus. General surgery was consulted.  NG tube was inserted. Treated conservatively with IV fluid and bowel rest.   Was given TPN while he had bowel rest. GI was consulted as well.   As the ileus resolved patient was gradually advanced but then reduced again GI reconsulted.  Smog enema ordered.  Repeat CT recommended.  Monitor for Patient has been chronically on baclofen .  There is a concern that that can lead to intestinal obstruction as well as ileus.  Recommended patient to taper this medication off (done) - Currently on full liquid diet.  Follow-up repeat abdominal x-ray being ordered today per GI  Right upper extremity edema. Doppler negative.  Monitor.  Low-grade fever -resolved Intermittently low-grade temp. No further fever Monitor clinically.  Mild renal dysfunction.  No AKI. Hypokalemia Hypomagnesemia. Baseline creatinine appears to be around 1.2. Serum creatinine on admission was 1.4. Not meeting AKI criteria. Treated with IV hydration. Potassium and magnesium  have been replaced.   Maintain K more than 4, mag more than  2.  Hyperglycemia Resolved Hemoglobin A1c 5.8  Hypophosphatemia Corrected  HLD On statin.  Currently on hold.  Chronic spasticity On gabapentin . Tapering of baclofen  baclofen  dose due to with concern for ileus  Cerebral myeloid angiopathy. History of intracerebral hemorrhage. Progressive leukoencephalopathy. Spastic hemiparesis of right side Sees neurology outpatient  Interval History:  No events overnight.  Resting comfortably in bed.  Denies any nausea.  Old records reviewed in assessment of this patient  Antimicrobials:   DVT prophylaxis:  Place and maintain sequential compression device Start: 02/26/24 1011 SCDs Start: 02/23/24 2056 Place TED hose Start: 02/23/24 2056   Code Status:   Code Status: Full Code  Mobility Assessment (Last 72 Hours)     Mobility Assessment     Row Name 03/07/24 1344 03/07/24 1200 03/06/24 2144 03/06/24 2035 03/06/24 0946   Does patient have an order for bedrest or is patient medically unstable -- No - Continue assessment No - Continue assessment No - Continue assessment No - Continue assessment   What is the highest level of mobility based on the progressive mobility assessment? Level 1 (Bedfast) - Unable to balance while sitting on edge of bed Level 1 (Bedfast) - Unable to balance while sitting on edge of bed Level 1 (Bedfast) - Unable to balance while sitting on edge of bed Level 1 (Bedfast) - Unable to balance while sitting on edge of bed Level 1 (Bedfast) - Unable to balance while sitting on edge of bed   Is the above level different from baseline mobility prior to current illness? -- No - Consider discontinuing PT/OT Yes - Recommend PT order Yes - Recommend PT order No - Consider discontinuing PT/OT  Row Name 03/05/24 1920 03/05/24 0700 03/04/24 2000       Does patient have an order for bedrest or is patient medically unstable No - Continue assessment Yes- Bedfast (Level 1) - Complete Yes- Bedfast (Level 1) - Complete     What  is the highest level of mobility based on the progressive mobility assessment? Level 1 (Bedfast) - Unable to balance while sitting on edge of bed Level 1 (Bedfast) - Unable to balance while sitting on edge of bed Level 1 (Bedfast) - Unable to balance while sitting on edge of bed     Is the above level different from baseline mobility prior to current illness? No - Consider discontinuing PT/OT Yes - Recommend PT order No - Consider discontinuing PT/OT              Barriers to discharge: None Disposition Plan: SNF Status is: Inpatient  Objective: Blood pressure 113/73, pulse 93, temperature 97.6 F (36.4 C), temperature source Oral, resp. rate 18, height 5\' 6"  (1.676 m), weight 72.3 kg, SpO2 95%.  Examination:  Physical Exam Constitutional:      General: He is not in acute distress.    Appearance: Normal appearance.  HENT:     Head: Normocephalic and atraumatic.     Mouth/Throat:     Mouth: Mucous membranes are moist.  Eyes:     Extraocular Movements: Extraocular movements intact.  Cardiovascular:     Rate and Rhythm: Normal rate and regular rhythm.  Pulmonary:     Effort: Pulmonary effort is normal. No respiratory distress.     Breath sounds: Normal breath sounds. No wheezing.  Abdominal:     General: Bowel sounds are normal. There is no distension.     Palpations: Abdomen is soft.     Tenderness: There is no abdominal tenderness.  Musculoskeletal:        General: Normal range of motion.     Cervical back: Normal range of motion and neck supple.  Skin:    General: Skin is warm and dry.  Neurological:     Mental Status: He is alert.     Comments: RLE 2-3/5 strength  Psychiatric:        Mood and Affect: Mood normal.        Behavior: Behavior normal.      Consultants:  GI Surgery - signed off  Procedures:    Data Reviewed: Results for orders placed or performed during the hospital encounter of 02/23/24 (from the past 24 hours)  Glucose, capillary     Status:  None   Collection Time: 03/06/24  4:56 PM  Result Value Ref Range   Glucose-Capillary 94 70 - 99 mg/dL  Glucose, capillary     Status: None   Collection Time: 03/06/24  8:29 PM  Result Value Ref Range   Glucose-Capillary 92 70 - 99 mg/dL  Basic metabolic panel with GFR     Status: Abnormal   Collection Time: 03/07/24  4:09 AM  Result Value Ref Range   Sodium 137 135 - 145 mmol/L   Potassium 3.8 3.5 - 5.1 mmol/L   Chloride 107 98 - 111 mmol/L   CO2 23 22 - 32 mmol/L   Glucose, Bld 102 (H) 70 - 99 mg/dL   BUN 9 6 - 20 mg/dL   Creatinine, Ser 1.61 0.61 - 1.24 mg/dL   Calcium  8.3 (L) 8.9 - 10.3 mg/dL   GFR, Estimated >09 >60 mL/min   Anion gap 7 5 - 15  Magnesium   Status: None   Collection Time: 03/07/24  4:09 AM  Result Value Ref Range   Magnesium  2.0 1.7 - 2.4 mg/dL  Glucose, capillary     Status: Abnormal   Collection Time: 03/07/24  8:06 AM  Result Value Ref Range   Glucose-Capillary 106 (H) 70 - 99 mg/dL  Glucose, capillary     Status: None   Collection Time: 03/07/24 12:22 PM  Result Value Ref Range   Glucose-Capillary 97 70 - 99 mg/dL    I have reviewed pertinent nursing notes, vitals, labs, and images as necessary. I have ordered labwork to follow up on as indicated.  I have reviewed the last notes from staff over past 24 hours. I have discussed patient's care plan and test results with nursing staff, CM/SW, and other staff as appropriate.  Time spent: Greater than 50% of the 55 minute visit was spent in counseling/coordination of care for the patient as laid out in the A&P.   LOS: 13 days   Faith Homes, MD [facility] 03/07/2024, 2:50 PM

## 2024-03-08 DIAGNOSIS — E46 Unspecified protein-calorie malnutrition: Secondary | ICD-10-CM | POA: Diagnosis not present

## 2024-03-08 DIAGNOSIS — K5981 Ogilvie syndrome: Secondary | ICD-10-CM | POA: Diagnosis not present

## 2024-03-08 DIAGNOSIS — K567 Ileus, unspecified: Secondary | ICD-10-CM | POA: Diagnosis not present

## 2024-03-08 LAB — BASIC METABOLIC PANEL WITH GFR
Anion gap: 8 (ref 5–15)
BUN: 16 mg/dL (ref 6–20)
CO2: 24 mmol/L (ref 22–32)
Calcium: 8.8 mg/dL — ABNORMAL LOW (ref 8.9–10.3)
Chloride: 105 mmol/L (ref 98–111)
Creatinine, Ser: 1.11 mg/dL (ref 0.61–1.24)
GFR, Estimated: >60 mL/min (ref >60)
Glucose, Bld: 118 mg/dL — ABNORMAL HIGH (ref 70–99)
Potassium: 3.6 mmol/L (ref 3.5–5.1)
Sodium: 137 mmol/L (ref 135–145)

## 2024-03-08 LAB — GLUCOSE, CAPILLARY
Glucose-Capillary: 100 mg/dL — ABNORMAL HIGH (ref 70–99)
Glucose-Capillary: 114 mg/dL — ABNORMAL HIGH (ref 70–99)
Glucose-Capillary: 125 mg/dL — ABNORMAL HIGH (ref 70–99)
Glucose-Capillary: 123 mg/dL — ABNORMAL HIGH (ref 70–99)

## 2024-03-08 MED ORDER — JUVEN PO PACK
1.0000 | PACK | Freq: Two times a day (BID) | ORAL | Status: DC
  Administered 2024-03-08 – 2024-03-09 (×4): 1 via ORAL
  Filled 2024-03-08 (×4): qty 1

## 2024-03-08 MED ORDER — METHYLNALTREXONE BROMIDE 12 MG/0.6ML ~~LOC~~ SOLN: 12.0000 mg | Freq: Once | SUBCUTANEOUS | Status: DC

## 2024-03-08 MED ORDER — SMOG ENEMA
400.0000 mL | Freq: Once | RECTAL | Status: AC
Start: 2024-03-08 — End: 2024-03-08
  Administered 2024-03-08: 400 mL via RECTAL
  Filled 2024-03-08: qty 960

## 2024-03-08 MED ORDER — ENSURE ENLIVE PO LIQD
237.0000 mL | Freq: Two times a day (BID) | ORAL | Status: DC
  Administered 2024-03-08 – 2024-03-09 (×4): 237 mL via ORAL

## 2024-03-08 NOTE — Plan of Care (Signed)

## 2024-03-08 NOTE — Progress Notes (Addendum)
 Patient ID: Daniel Stuart, male   DOB: 1966-04-29, 58 y.o.   MRN: [id number]     Attending physician's note   I have taken a history, reviewed the chart, and examined the patient. I performed a substantive portion of this encounter, including complete performance of at least one of the key components, in conjunction with the APP. I agree with the APP's note, impression, and recommendations with my edits.   Daniel Bonaventure, DO, FACG [phone] office          Progress Note   Subjective  Day #15 CC; persistent colonic ileus  Patient did receive 1 dose of Relistor yesterday, has not received any further enemas Not complaining of abdominal pain today, however on exam he seems a bit more distended and tympanitic today bowel sounds high-pitched  Abdominal films 03/07/2024 mildly distended and air-filled colon suggesting ileus  New labs today   Objective   Vital signs in last 24 hours: Temp:  [98.7 F (37.1 C)-98.9 F (37.2 C)] 98.7 F (37.1 C) (05/08 0300) Pulse Rate:  [87-92] 87 (05/08 0300) Resp:  [18] 18 (05/08 0300) BP: (109-123)/(71-78) 123/78 (05/08 0300) SpO2:  [93 %-100 %] 100 % (05/08 0300) Last BM Date : 03/07/24 General: Older chronically ill-appearing African-American male in NAD Heart:  Regular rate and rhythm; no murmurs Lungs: Respirations even and unlabored, lungs CTA bilaterally Abdomen: Protuberant and distended but not tense, nontender, bowel sounds high-pitched and tinkling. Extremities:  Without edema. Neurologic:  Alert and oriented,  grossly normal neurologically. Psych:  Cooperative. Normal mood and affect.  Intake/Output from previous day: 05/07 0701 - 05/08 0700 In: -  Out: 750 [Urine:750] Intake/Output this shift: Total I/O In: 118 [P.O.:118] Out: -   Lab Results: No results for input(s): "WBC", "HGB", "HCT", "PLT" in the last 72 hours. BMET Recent Labs    03/06/24 0434 03/07/24 0409  NA 135 137  K 3.3* 3.8  CL 103 107  CO2  22 23  GLUCOSE 105* 102*  BUN 9 9  CREATININE 1.11 1.08  CALCIUM  8.5* 8.3*   LFT No results for input(s): "PROT", "ALBUMIN", "AST", "ALT", "ALKPHOS", "BILITOT", "BILIDIR", "IBILI" in the last 72 hours. PT/INR No results for input(s): "LABPROT", "INR" in the last 72 hours.  Studies/Results: DG Abd 2 Views Result Date: 03/07/2024 CLINICAL DATA:  Ileus. EXAM: ABDOMEN - 2 VIEW COMPARISON:  Mar 03, 2024. FINDINGS: Mildly distended and air-filled colon is noted suggesting ileus. No small bowel dilatation is noted. IMPRESSION: Grossly stable gaseous distention of the colon. Electronically Signed   By: Rosalene Colon M.D.   On: 03/07/2024 17:28       Assessment / Plan:    #3  58 year old male with previous history of intracranial hemorrhage with residual right-sided weakness, currently bedbound with progressive leukoencephalopathy/cerebral myeloid angiopathy  #2 patient has had a persistent colonic ileus since admission-Ashley had some evidence of pneumatosis on the right side of the bowel on CT but repeat CT earlier this week showed that had resolved  Continues on twice daily MiraLAX  Did have improvement with twice daily smog enemas but when those were stopped yesterday he has developed some increase in distention again today he does not have pain and says he is able to eat. Did get 1 dose of Relistor yesterday  Plan; repeat BMP and magnesium  in a.m. Give another smog enema this evening Repeat 1 more dose of Relistor  He is going to be difficult to manage moving forward especially out of the  hospital, will benefit from trial of Motegrity which we cannot get here and may need to keep getting at least 1 tapwater enema per day to help him vent his colon upon discharge from the hospital.     Principal Problem:   Colonic pseudoobstruction Active Problems:   Hyperlipidemia   History of CVA (cerebrovascular accident)   Bedbound   Colitis   Hypokalemia   AKI (acute kidney injury) (HCC)    History of intracranial hemorrhage   Essential hypertension   Pressure injury of skin   Generalized abdominal pain   Protein calorie malnutrition (HCC)   Abnormal CT scan, colon   Edema of right upper arm   Ileus (HCC)   Abdominal distention     LOS: 14 days   Amy EsterwoodPA-C  03/08/2024, 4:16 PM

## 2024-03-08 NOTE — Progress Notes (Signed)
 Progress Note    Daniel Stuart   ZOX:[id number]  DOB: July 20, 1966  DOA: 02/23/2024     14 PCP: Jacquetta Mattocks, [name]  Initial CC: abd pain/distension   Hospital Course: [name] is a 57 yo male with PMH ICH with residual right weakness and bedbound, progressive leukoencephalopathy, PAD/left ICA stenosis, cortical blindness, HLD, HTN, antiphospholipid syndrome.  Presented to the hospital with complaints of abdominal pain and distention.  Treated for colonic ileus with pseudoobstruction.  GI was consulted.  General surgery was consulted as well.  Conservatively managed.  Was on TPN.  Now diet advanced.  Also continues to have poor p.o. intake.  Assessment and Plan: Pseudoobstruction of colon Presented with abdominal pain and distention. CT scan shows evidence of colonic distention without any obstruction.  Concern for ileus. General surgery was consulted.  NG tube was inserted. Treated conservatively with IV fluid and bowel rest.   Was given TPN while he had bowel rest. GI was consulted as well.   As the ileus resolved patient was gradually advanced but then reduced again GI reconsulted.  Smog enema ordered.  Repeat CT recommended.  Monitor for Patient has been chronically on baclofen .  There is a concern that that can lead to intestinal obstruction as well as ileus.  Recommended patient to taper this medication off (done) - Currently on full liquid diet.  - Repeat abdominal x-ray on 03/07/2024 shows stable gaseous distention.  No vomiting reported  Right upper extremity edema. Doppler negative. Monitor  Low-grade fever -resolved Intermittently low-grade temp No further fever Monitor clinically  Mild renal dysfunction.  No AKI. Hypokalemia Hypomagnesemia. Baseline creatinine appears to be around 1.2 Serum creatinine on admission was 1.4 Not meeting AKI criteria Treated with IV hydration Potassium and magnesium  have been replaced Maintain K more than 4, mag more than  2  Hyperglycemia Resolved Hemoglobin A1c 5.8  Hypophosphatemia Corrected  HLD On statin. Currently on hold  Chronic spasticity On gabapentin  Tapering of baclofen  baclofen  dose due to with concern for ileus  Cerebral myeloid angiopathy History of intracerebral hemorrhage Progressive leukoencephalopathy Spastic hemiparesis of right side Sees neurology outpatient  Interval History:  No events overnight.  Denies any vomiting.  Seems to be tolerating liquids fairly well.  Abdomen remains distended with tinkling sounds but still having bowel movements.  Old records reviewed in assessment of this patient  Antimicrobials:   DVT prophylaxis:  Place and maintain sequential compression device Start: 02/26/24 1011 SCDs Start: 02/23/24 2056 Place TED hose Start: 02/23/24 2056   Code Status:   Code Status: Full Code  Mobility Assessment (Last 72 Hours)     Mobility Assessment     Row Name 03/08/24 1020 03/07/24 2000 03/07/24 1344 03/07/24 1200 03/06/24 2144   Does patient have an order for bedrest or is patient medically unstable No - Continue assessment No - Continue assessment -- No - Continue assessment No - Continue assessment   What is the highest level of mobility based on the progressive mobility assessment? Level 1 (Bedfast) - Unable to balance while sitting on edge of bed Level 1 (Bedfast) - Unable to balance while sitting on edge of bed Level 1 (Bedfast) - Unable to balance while sitting on edge of bed Level 1 (Bedfast) - Unable to balance while sitting on edge of bed Level 1 (Bedfast) - Unable to balance while sitting on edge of bed   Is the above level different from baseline mobility prior to current illness? No - Consider discontinuing PT/OT  No - Consider discontinuing PT/OT -- No - Consider discontinuing PT/OT Yes - Recommend PT order    Row Name 03/06/24 2035 03/06/24 0946 03/05/24 1920       Does patient have an order for bedrest or is patient medically unstable  No - Continue assessment No - Continue assessment No - Continue assessment     What is the highest level of mobility based on the progressive mobility assessment? Level 1 (Bedfast) - Unable to balance while sitting on edge of bed Level 1 (Bedfast) - Unable to balance while sitting on edge of bed Level 1 (Bedfast) - Unable to balance while sitting on edge of bed     Is the above level different from baseline mobility prior to current illness? Yes - Recommend PT order No - Consider discontinuing PT/OT No - Consider discontinuing PT/OT              Barriers to discharge: None Disposition Plan: SNF Status is: Inpatient  Objective: Blood pressure 123/78, pulse 87, temperature 98.7 F (37.1 C), temperature source Oral, resp. rate 18, height 5\' 6"  (1.676 m), weight 72.3 kg, SpO2 100%.  Examination:  Physical Exam Constitutional:      General: He is not in acute distress.    Appearance: Normal appearance.  HENT:     Head: Normocephalic and atraumatic.     Mouth/Throat:     Mouth: Mucous membranes are moist.  Eyes:     Extraocular Movements: Extraocular movements intact.  Cardiovascular:     Rate and Rhythm: Normal rate and regular rhythm.  Pulmonary:     Effort: Pulmonary effort is normal. No respiratory distress.     Breath sounds: Normal breath sounds. No wheezing.  Abdominal:     General: Abdomen is protuberant. There is distension.     Palpations: Abdomen is soft.     Tenderness: There is no abdominal tenderness.     Comments: Tinkling bowel sounds  Musculoskeletal:        General: Normal range of motion.     Cervical back: Normal range of motion and neck supple.  Skin:    General: Skin is warm and dry.  Neurological:     Mental Status: He is alert.     Comments: RLE 2-3/5 strength  Psychiatric:        Mood and Affect: Mood normal.        Behavior: Behavior normal.      Consultants:  GI Surgery - signed off  Procedures:    Data Reviewed: Results for orders  placed or performed during the hospital encounter of 02/23/24 (from the past 24 hours)  Glucose, capillary     Status: Abnormal   Collection Time: 03/07/24  4:14 PM  Result Value Ref Range   Glucose-Capillary 166 (H) 70 - 99 mg/dL  Glucose, capillary     Status: Abnormal   Collection Time: 03/07/24  8:09 PM  Result Value Ref Range   Glucose-Capillary 163 (H) 70 - 99 mg/dL  Glucose, capillary     Status: Abnormal   Collection Time: 03/08/24  7:54 AM  Result Value Ref Range   Glucose-Capillary 100 (H) 70 - 99 mg/dL  Glucose, capillary     Status: Abnormal   Collection Time: 03/08/24 11:46 AM  Result Value Ref Range   Glucose-Capillary 114 (H) 70 - 99 mg/dL    I have reviewed pertinent nursing notes, vitals, labs, and images as necessary. I have ordered labwork to follow up on as indicated.  I have reviewed the last notes from staff over past 24 hours. I have discussed patient's care plan and test results with nursing staff, CM/SW, and other staff as appropriate.  Time spent: Greater than 50% of the 55 minute visit was spent in counseling/coordination of care for the patient as laid out in the A&P.   LOS: 14 days   Faith Homes, MD [facility] 03/08/2024, 3:33 PM

## 2024-03-08 NOTE — Plan of Care (Signed)
  Problem: Education: Goal: Knowledge of General Education information will improve Description: Including pain rating scale, medication(s)/side effects and non-pharmacologic comfort measures Outcome: Progressing   Problem: Nutrition: Goal: Adequate nutrition will be maintained Outcome: Progressing   Problem: Elimination: Goal: Will not experience complications related to bowel motility Outcome: Progressing   Problem: Pain Managment: Goal: General experience of comfort will improve and/or be controlled Outcome: Progressing   Problem: Skin Integrity: Goal: Risk for impaired skin integrity will decrease Outcome: Progressing

## 2024-03-09 DIAGNOSIS — K5981 Ogilvie syndrome: Secondary | ICD-10-CM | POA: Diagnosis not present

## 2024-03-09 LAB — BASIC METABOLIC PANEL WITH GFR
Anion gap: 13 (ref 5–15)
BUN: 11 mg/dL (ref 6–20)
CO2: 20 mmol/L — ABNORMAL LOW (ref 22–32)
Calcium: 8.6 mg/dL — ABNORMAL LOW (ref 8.9–10.3)
Chloride: 105 mmol/L (ref 98–111)
Creatinine, Ser: 1.13 mg/dL (ref 0.61–1.24)
GFR, Estimated: >60 mL/min (ref >60)
Glucose, Bld: 91 mg/dL (ref 70–99)
Potassium: 3.8 mmol/L (ref 3.5–5.1)
Sodium: 138 mmol/L (ref 135–145)

## 2024-03-09 LAB — MAGNESIUM: Magnesium: 2 mg/dL (ref 1.7–2.4)

## 2024-03-09 LAB — GLUCOSE, CAPILLARY: Glucose-Capillary: 97 mg/dL (ref 70–99)

## 2024-03-09 MED ORDER — VITAMIN D3 25 MCG PO TABS: 1000.0000 [IU] | ORAL_TABLET | Freq: Every day | ORAL | Status: DC

## 2024-03-09 MED ORDER — SMOG ENEMA
960.0000 mL | RECTAL | Status: AC
Start: 2024-03-10 — End: ?

## 2024-03-09 MED ORDER — POLYETHYLENE GLYCOL 3350 17 G PO PACK: 17.0000 g | PACK | Freq: Two times a day (BID) | ORAL | Status: AC

## 2024-03-09 MED ORDER — JUVEN PO PACK: 1.0000 | PACK | Freq: Two times a day (BID) | ORAL | Status: AC

## 2024-03-09 NOTE — TOC Transition Note (Signed)
 Transition of Care [facility]) - Discharge Note   Patient Details  Name: Daniel Stuart MRN: [id number] Date of Birth: Jun 17, 1966  [facility] CM/SW Contact:  Sanai Frick A [place], LCSW Phone Number: 03/09/2024, 1:44 PM   Clinical Narrative:     Patient will DC to: [facility]  Anticipated DC date: 03/09/24  Family notified: Tory Freiberg  Transport by: Lyna Sandhoff      Per MD patient ready for DC to [facility]. RN, patient, patient's family, and facility notified of DC. Discharge Summary and FL2 sent to facility. RN to call report prior to discharge [phone]). DC packet on chart. Ambulance transport requested for patient.     CSW will sign off for now as social work intervention is no longer needed. Please consult us  again if new needs arise.   Final next level of care: Skilled Nursing Facility Barriers to Discharge: Barriers Resolved   Patient Goals and CMS Choice            Discharge Placement              Patient chooses bed at: [facility] Patient to be transferred to facility by: [facility] Name of family member notified: Tory Freiberg Patient and family notified of of transfer: 03/09/24  Discharge Plan and Services Additional resources added to the After Visit Summary for                                       Social Drivers of Health (SDOH) Interventions SDOH Screenings   Food Insecurity: Patient Unable To Answer (02/24/2024)  Housing: Patient Unable To Answer (02/24/2024)  Transportation Needs: Patient Unable To Answer (02/24/2024)  Utilities: Patient Unable To Answer (02/24/2024)  Alcohol Screen: Low Risk  (07/31/2021)  Depression (PHQ2-9): Low Risk  (04/13/2022)  Financial Resource Strain: Medium Risk (07/31/2021)  Physical Activity: Inactive (07/31/2021)  Social Connections: Socially Isolated (07/31/2021)  Stress: No Stress Concern Present (07/31/2021)  Tobacco Use: High Risk (03/02/2024)     Readmission Risk Interventions      No data to display

## 2024-03-09 NOTE — Discharge Summary (Signed)
 Physician Discharge Summary   Daniel Stuart WGN:[id number] DOB: 05-19-1966 DOA: 02/23/2024  PCP: Paseda, Folashade R, [name]  Admit date: 02/23/2024 Discharge date: 03/09/2024  Admitted From: [facility] Disposition:  [facility] Discharging physician: Faith Homes, MD Barriers to discharge: none  Recommendations at discharge: Follow up with GI Advance diet as tolerated Continue wound care Monitor bowel movements and may need to increase enemas or laxatives to achieve daily BM   Discharge Condition: stable CODE STATUS: Full  Diet recommendation:  Diet Orders (From admission, onward)     Start     Ordered   03/06/24 1545  Diet full liquid Room service appropriate? Yes with Assist; Fluid consistency: Thin  Diet effective now       Question Answer Comment  Room service appropriate? Yes with Assist   Fluid consistency: Thin      03/06/24 1544   03/02/24 0000  DIET SOFT        03/02/24 0845            Hospital Course: Daniel Stuart is a 58 yo male with PMH ICH with residual right weakness and bedbound, progressive leukoencephalopathy, PAD/left ICA stenosis, cortical blindness, HLD, HTN, antiphospholipid syndrome.  Presented to the hospital with complaints of abdominal pain and distention.  Treated for colonic ileus with pseudoobstruction.  GI was consulted.  General surgery was consulted as well.  Conservatively managed.  Was on TPN.  Now diet advanced.  Also continues to have poor p.o. intake.  Assessment and Plan: Pseudoobstruction of colon Presented with abdominal pain and distention. CT scan shows evidence of colonic distention without any obstruction.  Concern for ileus. General surgery was consulted.  NG tube was inserted. Treated conservatively with IV fluid and bowel rest.  Was given TPN while he had bowel rest. GI was consulted as well.   As the ileus resolved patient was gradually advanced Smog enema ordered and tolerated well Patient has been chronically on  baclofen .  There is a concern that that can lead to intestinal obstruction as well as ileus.  Recommended patient to taper this medication off (done) - Currently on full liquid diet. Tolerating well, can ADAT at discharge - Repeat abdominal x-ray on 03/07/2024 shows stable gaseous distention.  No vomiting reported - bowel regimen continued at discharge; outpt followup with GI planned as well  - overall, despite abd remaining distended with hyperpitched sounds, he is asymptomatic and tolerating diet with regular BM. Discussed with GI, and felt to be stable for discharge and if were to worsen again, would need to be considered for re-admission, but at this time he remains stable and improved   Right upper extremity edema. Doppler negative. Monitor  Low-grade fever -resolved Intermittently low-grade temp No further fever Monitor clinically  Mild renal dysfunction.  No AKI. Hypokalemia Hypomagnesemia. Baseline creatinine appears to be around 1.2 Serum creatinine on admission was 1.4 Not meeting AKI criteria Treated with IV hydration Potassium and magnesium  have been replaced  Hyperglycemia Resolved Hemoglobin A1c 5.8  Hypophosphatemia Corrected  HLD On statin.  Chronic spasticity On gabapentin  Tapering of baclofen  baclofen  dose due to with concern for ileus  Cerebral myeloid angiopathy History of intracerebral hemorrhage Progressive leukoencephalopathy Spastic hemiparesis of right side Sees neurology outpatient   The patient's acute and chronic medical conditions were treated accordingly. On day of discharge, patient was felt deemed stable for discharge. Patient/family member advised to call PCP or come back to ER if needed.   Principal Diagnosis: Colonic pseudoobstruction  Discharge Diagnoses: Active Hospital  Problems   Diagnosis Date Noted   Colonic pseudoobstruction 02/23/2024    Priority: High   Edema of right upper arm 02/28/2024    Priority: High   Colitis  02/23/2024    Priority: High   Hypokalemia 02/23/2024    Priority: Medium    AKI (acute kidney injury) (HCC) 02/23/2024    Priority: Medium    Pressure injury of skin 02/24/2024    Priority: Low   History of intracranial hemorrhage 02/23/2024    Priority: Low   Essential hypertension 02/23/2024    Priority: Low   History of CVA (cerebrovascular accident) 03/12/2019    Priority: Low   Hyperlipidemia     Priority: Low   Ileus (HCC) 03/05/2024   Abdominal distention 03/05/2024   Protein calorie malnutrition (HCC) 02/27/2024   Abnormal CT scan, colon 02/27/2024   Generalized abdominal pain 02/24/2024   Bedbound 08/25/2022    Resolved Hospital Problems  No resolved problems to display.     Discharge Instructions     Advance diet as tolerated   Complete by: As directed    Advance from: Diet full liquid   Advance to: Diet regular   Fluid consistency: Thin   DIET SOFT   Complete by: As directed    Discharge wound care:   Complete by: As directed    Cleanse R ankle with soap and water, dry and apply Xeroform gauze to wound bed daily, cover with silicone foam or dry gauze and Kerlix roll gauze whichever is preferred.   Cleanse B buttocks with soap and water, dry and apply Xeroform gauze to open areas and area of dark discoloration.  Cover with silicone foam or ABD pad and tape whichever is preferred   Discharge wound care:   Complete by: As directed    Cleanse R ankle with soap and water, dry and apply Xeroform gauze Timm Foot [phone]) to wound bed daily, cover with silicone foam or dry gauze and Kerlix roll gauze whichever is preferred.   2.         Cleanse B buttocks with soap and water, dry and apply Xeroform gauze Timm Foot [phone]) to open areas and area of dark discoloration.  Cover with silicone foam or ABD pad and tape whichever is preferred   Increase activity slowly   Complete by: As directed    Increase activity slowly   Complete by: As directed       Allergies as of  03/09/2024   No Known Allergies      Medication List     STOP taking these medications    atorvastatin  80 MG tablet Commonly known as: LIPITOR    baclofen  10 MG tablet Commonly known as: LIORESAL    citalopram  10 MG tablet Commonly known as: CeleXA    oxybutynin  10 MG 24 hr tablet Commonly known as: DITROPAN -XL   senna-docusate 8.6-50 MG tablet Commonly known as: Senokot-S       TAKE these medications    acetaminophen  325 MG tablet Commonly known as: TYLENOL  Take 2 tablets (650 mg total) by mouth every 6 (six) hours as needed for mild pain, moderate pain, fever or headache (or temp > 37.5 C (99.5 F)).   cyanocobalamin  1000 MCG tablet Take 1 tablet (1,000 mcg total) by mouth daily.   feeding supplement Liqd Take 237 mLs by mouth 2 (two) times daily between meals.   nutrition supplement (JUVEN) Pack Take 1 packet by mouth 2 (two) times daily between meals.   gabapentin  300 MG capsule Commonly known as: NEURONTIN   TAKE 1 CAPSULE BY MOUTH BY MOUTH TWO TIMES DAILY What changed:  how much to take how to take this when to take this   ipratropium-albuterol 0.5-2.5 (3) MG/3ML Soln Commonly known as: DUONEB Take 3 mLs by nebulization every 6 (six) hours as needed (SOB, wheezing).   metoCLOPramide  5 MG tablet Commonly known as: REGLAN  Take 1 tablet (5 mg total) by mouth 3 (three) times daily before meals for 7 days.   pantoprazole  40 MG tablet Commonly known as: PROTONIX  Take 1 tablet (40 mg total) by mouth at bedtime.   polyethylene glycol 17 g packet Commonly known as: MIRALAX  / GLYCOLAX  Take 17 g by mouth 2 (two) times daily.   simethicone  80 MG chewable tablet Commonly known as: MYLICON Chew 1 tablet (80 mg total) by mouth 4 (four) times daily -  before meals and at bedtime.   SMOG 960 mL Soln enema Place 960 mLs rectally every other day. Start taking on: Mar 10, 2024   vitamin D3 25 MCG tablet Commonly known as: CHOLECALCIFEROL  Take 1 tablet (1,000  Units total) by mouth daily.               Discharge Care Instructions  (From admission, onward)           Start     Ordered   03/09/24 0000  Discharge wound care:       Comments: Cleanse R ankle with soap and water, dry and apply Xeroform gauze Timm Foot [phone]) to wound bed daily, cover with silicone foam or dry gauze and Kerlix roll gauze whichever is preferred.   2.         Cleanse B buttocks with soap and water, dry and apply Xeroform gauze Timm Foot [phone]) to open areas and area of dark discoloration.  Cover with silicone foam or ABD pad and tape whichever is preferred   03/09/24 1106   03/02/24 0000  Discharge wound care:       Comments: Cleanse R ankle with soap and water, dry and apply Xeroform gauze to wound bed daily, cover with silicone foam or dry gauze and Kerlix roll gauze whichever is preferred.   Cleanse B buttocks with soap and water, dry and apply Xeroform gauze to open areas and area of dark discoloration.  Cover with silicone foam or ABD pad and tape whichever is preferred   03/02/24 0845            Contact information for follow-up providers     Paseda, Folashade R, [name]. Schedule an appointment as soon as possible for a visit in 1 week(s).   Specialty: Nurse Practitioner Contact information: [address] [place] [address] [phone]              Contact information for after-discharge care     Destination     [facility] [place] [place] Preferred SNF .   Service: Skilled Nursing Contact information: [address] [place]  [address] [phone]                    No Known Allergies  Consultations: GI  Procedures:   Discharge Exam: BP 114/79 (BP Location: Right Arm)   Pulse (!) 106   Temp 97.9 F (36.6 C) (Oral)   Resp 18   Ht 5\' 6"  (1.676 m)   Wt 72.3 kg Comment: Bed weight  SpO2 99%   BMI 25.73 kg/m  Physical Exam Constitutional:      General: He is  not in  acute distress.    Appearance: Normal appearance.  HENT:     Head: Normocephalic and atraumatic.     Mouth/Throat:     Mouth: Mucous membranes are moist.  Eyes:     Extraocular Movements: Extraocular movements intact.  Cardiovascular:     Rate and Rhythm: Normal rate and regular rhythm.  Pulmonary:     Effort: Pulmonary effort is normal. No respiratory distress.     Breath sounds: Normal breath sounds. No wheezing.  Abdominal:     General: Abdomen is protuberant. There is distension.     Palpations: Abdomen is soft.     Tenderness: There is no abdominal tenderness.     Comments: Tinkling bowel sounds  Musculoskeletal:        General: Normal range of motion.     Cervical back: Normal range of motion and neck supple.  Skin:    General: Skin is warm and dry.  Neurological:     Mental Status: He is alert.     Comments: RLE 2-3/5 strength  Psychiatric:        Mood and Affect: Mood normal.        Behavior: Behavior normal.      The results of significant diagnostics from this hospitalization (including imaging, microbiology, ancillary and laboratory) are listed below for reference.   Microbiology: No results found for this or any previous visit (from the past 240 hours).   Labs: BNP (last 3 results) No results for input(s): "BNP" in the last 8760 hours. Basic Metabolic Panel: Recent Labs  Lab 03/03/24 0355 03/05/24 0222 03/06/24 0434 03/07/24 0409 03/08/24 1700 03/09/24 0433 03/09/24 0846  NA 136 134* 135 137 137  --  138  K 4.1 3.6 3.3* 3.8 3.6  --  3.8  CL 103 100 103 107 105  --  105  CO2 23 23 22 23 24   --  20*  GLUCOSE 114* 101* 105* 102* 118*  --  91  BUN 11 14 9 9 16   --  11  CREATININE 1.18 1.11 1.11 1.08 1.11  --  1.13  CALCIUM  8.3* 8.5* 8.5* 8.3* 8.8*  --  8.6*  MG 1.9 2.1 2.1 2.0  --  2.0  --   PHOS  --  3.6  --   --   --   --   --    Liver Function Tests: Recent Labs  Lab 03/03/24 0355  AST 72*  ALT 118*  ALKPHOS 63  BILITOT 0.6  PROT 6.7   ALBUMIN 2.5*   No results for input(s): "LIPASE", "AMYLASE" in the last 168 hours. No results for input(s): "AMMONIA" in the last 168 hours. CBC: Recent Labs  Lab 03/03/24 0355 03/05/24 0222  WBC 9.5 11.4*  NEUTROABS 6.6  --   HGB 11.8* 12.3*  HCT 35.4* 36.8*  MCV 82.5 82.7  PLT 219 291   Cardiac Enzymes: No results for input(s): "CKTOTAL", "CKMB", "CKMBINDEX", "TROPONINI" in the last 168 hours. BNP: Invalid input(s): "POCBNP" CBG: Recent Labs  Lab 03/07/24 2009 03/08/24 0754 03/08/24 1146 03/08/24 1715 03/08/24 2023  GLUCAP 163* 100* 114* 125* 123*   D-Dimer No results for input(s): "DDIMER" in the last 72 hours. Hgb A1c No results for input(s): "HGBA1C" in the last 72 hours. Lipid Profile No results for input(s): "CHOL", "HDL", "LDLCALC", "TRIG", "CHOLHDL", "LDLDIRECT" in the last 72 hours. Thyroid  function studies No results for input(s): "TSH", "T4TOTAL", "T3FREE", "THYROIDAB" in the last 72 hours.  Invalid input(s): "FREET3" Anemia  work up No results for input(s): "VITAMINB12", "FOLATE", "FERRITIN", "TIBC", "IRON", "RETICCTPCT" in the last 72 hours. Urinalysis    Component Value Date/Time   COLORURINE YELLOW 02/23/2024 2352   APPEARANCEUR CLEAR 02/23/2024 2352   LABSPEC 1.035 (H) 02/23/2024 2352   PHURINE 5.0 02/23/2024 2352   GLUCOSEU NEGATIVE 02/23/2024 2352   HGBUR NEGATIVE 02/23/2024 2352   BILIRUBINUR NEGATIVE 02/23/2024 2352   BILIRUBINUR negative 07/10/2021 1202   BILIRUBINUR neg 06/11/2020 0901   KETONESUR NEGATIVE 02/23/2024 2352   PROTEINUR NEGATIVE 02/23/2024 2352   UROBILINOGEN 0.2 07/10/2021 1202   UROBILINOGEN 0.2 11/30/2017 1030   NITRITE NEGATIVE 02/23/2024 2352   LEUKOCYTESUR NEGATIVE 02/23/2024 2352   Sepsis Labs Recent Labs  Lab 03/03/24 0355 03/05/24 0222  WBC 9.5 11.4*   Microbiology No results found for this or any previous visit (from the past 240 hours).  Procedures/Studies: DG Abd 2 Views Result Date:  03/07/2024 CLINICAL DATA:  Ileus. EXAM: ABDOMEN - 2 VIEW COMPARISON:  Mar 03, 2024. FINDINGS: Mildly distended and air-filled colon is noted suggesting ileus. No small bowel dilatation is noted. IMPRESSION: Grossly stable gaseous distention of the colon. Electronically Signed   By: Rosalene Colon M.D.   On: 03/07/2024 17:28   CT ABDOMEN PELVIS W WO CONTRAST Result Date: 03/06/2024 CLINICAL DATA:  Abdominal pain, distention. Bowel obstruction suspected persistent ileus , compare to prior Ct EXAM: CT ABDOMEN AND PELVIS WITHOUT AND WITH CONTRAST TECHNIQUE: Multidetector CT imaging of the abdomen and pelvis was performed following the standard protocol before and following the bolus administration of intravenous contrast. RADIATION DOSE REDUCTION: This exam was performed according to the departmental dose-optimization program which includes automated exposure control, adjustment of the mA and/or kV according to patient size and/or use of iterative reconstruction technique. CONTRAST:  75mL OMNIPAQUE  IOHEXOL  350 MG/ML SOLN COMPARISON:  02/23/2024 FINDINGS: Lower chest: Bibasilar patchy airspace opacities, similar to prior study. No effusions. Hepatobiliary: No focal hepatic abnormality. Gallbladder unremarkable. Pancreas: No focal abnormality or ductal dilatation. Spleen: No focal abnormality.  Normal size. Adrenals/Urinary Tract: Bilateral nephrolithiasis. No ureteral stones or hydronephrosis. Urinary bladder and adrenal glands unremarkable. Stomach/Bowel: Moderate gaseous distension of the colon diffusely with scattered air-fluid levels. Stomach and small bowel decompressed. Appendix is normal. Overall gaseous distention of the colon is similar to prior study. The previously seen pneumatosis in the ascending colon no longer visualized. Vascular/Lymphatic: Aortic atherosclerosis. No evidence of aneurysm or adenopathy. Reproductive: No visible focal abnormality. Other: No free fluid or free air. Musculoskeletal: No acute  bony abnormality. IMPRESSION: Continued gaseous distention of the colon with scattered air-fluid levels, similar prior study, most consistent with colonic ileus. Resolution of the previously seen ascending colonic pneumatosis. Nonobstructing bilateral renal stones. Aortic atherosclerosis. Stable patchy ground-glass opacities in the lower lobes bilaterally concerning for pneumonia. Electronically Signed   By: Janeece Mechanic M.D.   On: 03/06/2024 01:05   DG Abd 1 View Result Date: 03/03/2024 CLINICAL DATA:  Abdominal distension. EXAM: ABDOMEN - 1 VIEW COMPARISON:  Abdominal radiograph dated 03/01/2024. FINDINGS: Diffuse gas is distension of the colon. No small bowel dilatation. No free air or radiopaque calculi. The osseous structures are intact. The soft tissues are unremarkable. IMPRESSION: Diffuse gaseous distension of the colon. Electronically Signed   By: Angus Bark M.D.   On: 03/03/2024 18:16   DG Abd Portable 1V Result Date: 03/01/2024 CLINICAL DATA:  Follow-up colonic ileus EXAM: PORTABLE ABDOMEN - 1 VIEW COMPARISON:  02/28/2024 FINDINGS: Persistent gaseous distension of the colon is noted.  This is slightly increased when compared with the prior exam consistent with the given clinical history. No definitive obstructive lesion is seen. No free air is noted. IMPRESSION: Persistent and slightly worsened colonic ileus. Electronically Signed   By: Violeta Grey M.D.   On: 03/01/2024 09:22   VAS US  UPPER EXTREMITY VENOUS DUPLEX Result Date: 02/28/2024 UPPER VENOUS STUDY  Patient Name:  Daniel Stuart  Date of Exam:   02/28/2024 Medical Rec #: [id number]          Accession #:    [id number] Date of Birth: Mar 01, 1966          Patient Gender: M Patient Age:   58 years Exam [facility] Procedure:      VAS US  UPPER EXTREMITY VENOUS DUPLEX Referring Phys: ERIC CHEN --------------------------------------------------------------------------------  Indications: Swelling, and Edema Other  Indications: History of strokes/TIAs. Performing Technologist: Franky Ivanoff Sturdivant-Jones RDMS, RVT  Examination Guidelines: A complete evaluation includes B-mode imaging, spectral Doppler, color Doppler, and power Doppler as needed of all accessible portions of each vessel. Bilateral testing is considered an integral part of a complete examination. Limited examinations for reoccurring indications may be performed as noted.  Right Findings: +----------+------------+---------+-----------+----------+-------+ RIGHT     CompressiblePhasicitySpontaneousPropertiesSummary +----------+------------+---------+-----------+----------+-------+ IJV           Full       Yes       Yes                      +----------+------------+---------+-----------+----------+-------+ Subclavian    Full       Yes       Yes                      +----------+------------+---------+-----------+----------+-------+ Axillary      Full       Yes       Yes                      +----------+------------+---------+-----------+----------+-------+ Brachial      Full                                          +----------+------------+---------+-----------+----------+-------+ Radial        Full                                          +----------+------------+---------+-----------+----------+-------+ Ulnar         Full                                          +----------+------------+---------+-----------+----------+-------+ Cephalic      Full                                          +----------+------------+---------+-----------+----------+-------+ Basilic       Full                                          +----------+------------+---------+-----------+----------+-------+  Left Findings: +----------+------------+---------+-----------+----------+-------+  LEFT      CompressiblePhasicitySpontaneousPropertiesSummary +----------+------------+---------+-----------+----------+-------+ Subclavian                Yes       Yes                      +----------+------------+---------+-----------+----------+-------+  Summary:  Right: No evidence of deep vein thrombosis in the upper extremity. No evidence of superficial vein thrombosis in the upper extremity.  Left: No evidence of thrombosis in the subclavian.  *See table(s) above for measurements and observations.  Diagnosing physician: Angela Kell MD Electronically signed by Angela Kell MD on 02/28/2024 at 11:04:49 AM.    Final    DG Abd 1 View Result Date: 02/28/2024 CLINICAL DATA:  Colonic dilatation, possible pseudo-obstruction EXAM: ABDOMEN - 1 VIEW COMPARISON:  Film from the previous day. FINDINGS: Scattered large and small bowel gas is noted. No obstructive changes are seen. No free air is noted. Degenerative changes of lumbar spine are seen. IMPRESSION: No obstructive pattern is noted. Electronically Signed   By: Violeta Grey M.D.   On: 02/28/2024 08:25   DG Abd Portable 1V Result Date: 02/27/2024 CLINICAL DATA:  Colonic pseudo-obstruction.  Abdominal pain. EXAM: PORTABLE ABDOMEN - 1 VIEW COMPARISON:  Abdominal radiograph dated 02/24/2024. FINDINGS: Enteric tube in similar position. No bowel dilatation or evidence of obstruction. Resolution of the previously seen dilated colon. No free air or radiopaque calculi. The osseous structures are intact. The soft tissues are unremarkable. IMPRESSION: Nonobstructive bowel gas pattern. Electronically Signed   By: Angus Bark M.D.   On: 02/27/2024 15:31   DG CHEST PORT 1 VIEW Result Date: 02/26/2024 CLINICAL DATA:  PICC line placement EXAM: PORTABLE CHEST 1 VIEW COMPARISON:  09/08/2022 FINDINGS: Left upper extremity PICC line tip seen within the superior cavoatrial junction. Nasogastric tube extends into the upper abdomen within the expected gastric cardia. Bibasilar atelectasis is present. Lung volumes are small but are symmetric. No pneumothorax or pleural effusion. Cardiac size is within normal limits  when accounting for poor pulmonary insufflation. Implanted loop recorder noted. Pulmonary vascularity is normal. No acute. IMPRESSION: 1. Left upper extremity PICC line tip within the superior cavoatrial junction. 2. Pulmonary hypoinflation. Electronically Signed   By: Worthy Heads M.D.   On: 02/26/2024 19:26   DG Abd 1 View Result Date: 02/26/2024 CLINICAL DATA:  Abdominal pain and possible constipation EXAM: ABDOMEN - 1 VIEW COMPARISON:  02/25/2024 FINDINGS: Scattered large and small bowel gas is noted. This may represent a colonic ileus. No significant small bowel dilatation is seen. Gastric catheter remains in the stomach. No free air is noted. IMPRESSION: Findings suspicious for colonic ileus. Electronically Signed   By: Violeta Grey M.D.   On: 02/26/2024 10:37   US  EKG SITE RITE Result Date: 02/26/2024 If Site Rite image not attached, placement could not be confirmed due to current cardiac rhythm.  DG Abd 1 View Result Date: 02/25/2024 CLINICAL DATA:  NG tube placement. EXAM: ABDOMEN - 1 VIEW COMPARISON:  One-view abdomen 02/24/2024 FINDINGS: Side port of the NG tube is in the fundus the stomach. Slight decompression of dilated loops of small bowel noted. Implanted loop recorder again noted. IMPRESSION: 1. Side port of the NG tube is in the fundus the stomach. 2. Slight decompression of dilated loops of small bowel. Electronically Signed   By: Audree Leas M.D.   On: 02/25/2024 11:46   DG Abd Portable 1V Result Date: 02/24/2024 CLINICAL DATA:  Ileus. EXAM: PORTABLE ABDOMEN -  1 VIEW COMPARISON:  February 23, 2024. FINDINGS: Stable diffuse colonic dilatation is noted most consistent with ileus. No definite small bowel dilatation is noted. Bilateral nephrolithiasis is again noted. IMPRESSION: Stable diffuse colonic dilatation most consistent with ileus. Electronically Signed   By: Rosalene Colon M.D.   On: 02/24/2024 14:42   CT ABDOMEN PELVIS W CONTRAST Result Date: 02/23/2024 CLINICAL  DATA:  Acute abdominal pain. EXAM: CT ABDOMEN AND PELVIS WITH CONTRAST TECHNIQUE: Multidetector CT imaging of the abdomen and pelvis was performed using the standard protocol following bolus administration of intravenous contrast. RADIATION DOSE REDUCTION: This exam was performed according to the departmental dose-optimization program which includes automated exposure control, adjustment of the mA and/or kV according to patient size and/or use of iterative reconstruction technique. CONTRAST:  75mL OMNIPAQUE  IOHEXOL  350 MG/ML SOLN COMPARISON:  None Available. FINDINGS: Lower chest: There are patchy ground-glass opacities in the bilateral lower lobes. Hepatobiliary: No focal liver abnormality is seen. No gallstones, gallbladder wall thickening, or biliary dilatation. Pancreas: Unremarkable. No pancreatic ductal dilatation or surrounding inflammatory changes. Spleen: Normal in size without focal abnormality. Adrenals/Urinary Tract: There a are punctate bilateral renal calculi measuring up to 4 mm. Otherwise, the kidneys, adrenal glands and bladder are within normal limits. Stomach/Bowel: The colon is diffusely dilated and contains air-fluid levels. There is some mild circumferential wall thickening of the anterior and peripheral bubbly lucencies are seen in the ascending colon which may be related to fecal content; however, pneumatosis would be difficult to exclude. There is no free intraperitoneal air. The appendix, small bowel, and stomach are within normal limits. Vascular/Lymphatic: Aortic atherosclerosis. No enlarged abdominal or pelvic lymph nodes. Reproductive: Prostate is unremarkable. Other: There is a small fat containing umbilical hernia. Musculoskeletal: No acute or significant osseous findings.  The IMPRESSION: 1. Diffuse dilatation of the colon with air-fluid levels worrisome for colonic ileus the. Mild wall thickening of the transverse compatible with colitis cannot exclude pneumatosis in the ascending  colon. Correlate clinically for ischemia. 2. No free intraperitoneal air. 3. Nonobstructing bilateral renal calculi. 4. Patchy ground-glass opacities in the bilateral lower lobes worrisome for infection or inflammation. 5. Aortic atherosclerosis. Aortic Atherosclerosis (ICD10-I70.0). Electronically Signed   By: Tyron Gallon M.D.   On: 02/23/2024 20:02     Time coordinating discharge: Over 30 minutes    Faith Homes, MD  [facility] 03/09/2024, 11:10 AM

## 2024-03-09 NOTE — Progress Notes (Signed)
 Call in report to [facility] facility and spoke with assigned nurse, Francena Infield, LPN. The assigned nurse was also notified pt need to schedule an appointment in 1 week with his PCP, Paseda, Folashade, [name].Aaron Aas

## 2024-03-12 ENCOUNTER — Other Ambulatory Visit: Payer: Self-pay

## 2024-03-13 ENCOUNTER — Other Ambulatory Visit: Payer: Self-pay

## 2024-04-05 ENCOUNTER — Ambulatory Visit: Admitting: Gastroenterology

## 2024-04-05 NOTE — Progress Notes (Deleted)
 HPI :  Impression: 1.  Ileus/colitis: Presented to the ED from assisted living facility with no bowel movement for 3 days and abdominal distention generalized abdominal pain, no nausea or vomiting, hemodynamically stable with a leukocytosis of 15.9 evidence of AKI and hypokalemia, CT with dilation of the colon and air-fluid levels worrisome for colonic ileus with mild wall thickening of the transverse colon compatible with colitis but could not exclude pneumatosis in the ascending colon, repeat x-ray today with stable ileus; consider relation to hypokalemia +/- chronic constipation (unable to get this history from him) 2.  Hypokalemia: Low potassium level initially at 3--> 2.9 overnight 3.  AKI on CKD stage II 4.  History of severe encephalopathy and intracranial hemorrhage as well as CVA/TIA and progressive leukoencephalopathy: Previously on aspirin , Eliquis and Plavix  but everything was discontinued after patient developed intracranial hemorrhage 5.  Chronically bedbound 6.  History of cortical blindness 7.  History of antiphospholipid syndrome   Plan: 1.  Correct potassium-will leave this to the hospitalist team, magnesium  pending 2.  Patient being bedbound does not help the situation 3.  Continue n.p.o. for now.   4.  NG tube discussed with sister who is his POA (unofficially).  She would like us  to proceed with placing this after discussion.  I have ordered. 5.  Please await further recommendations from Dr. Rosaline Coma.   58 year old male with history of intracranial hemorrhage 06/2023 due to severe leukoencephalopathy, TIA/CVA, cognitive impairment, PVD, APLA presented with abdominal pain and distention, found to have an ileus. He has not had a bowel movement for 3 days when he presented yesterday. CT shows diffuse dilatation of the colon with air-fluid levels that is concerning for colonic ileus with mild wall thickening of the transverse colon compatible with colitis. C. difficile was  negative. Recommend repletion of potassium and maintain above 4. Continue to trend magnesium  and maintain above 2. No recent opioid use. Patient is bedbound and as result will need to be rolled every 2 hours to aid with mobility. Keep n.p.o. for now. Surgery is not recommending any surgical intervention. Patient is not able to fully make medical decisions due to some cognitive impairment so we called the patient's sister who agreed to NG tube placement. Thus we will proceed with NG tube placement for gastric decompression. [facility] to take over service tomorrow    Day #15 CC; persistent colonic ileus   Patient did receive 1 dose of Relistor  yesterday, has not received any further enemas Not complaining of abdominal pain today, however on exam he seems a bit more distended and tympanitic today bowel sounds high-pitched   Abdominal films 03/07/2024 mildly distended and air-filled colon suggesting ileus   New labs today  #39  58 year old male with previous history of intracranial hemorrhage with residual right-sided weakness, currently bedbound with progressive leukoencephalopathy/cerebral myeloid angiopathy   #2 patient has had a persistent colonic ileus since admission-Ashley had some evidence of pneumatosis on the right side of the bowel on CT but repeat CT earlier this week showed that had resolved   Continues on twice daily MiraLAX  Did have improvement with twice daily smog enemas but when those were stopped yesterday he has developed some increase in distention again today he does not have pain and says he is able to eat. Did get 1 dose of Relistor  yesterday   Plan; repeat BMP and magnesium  in a.m. Give another smog enema this evening Repeat 1 more dose of Relistor    He is going to be difficult  to manage moving forward especially out of the hospital, will benefit from trial of Motegrity which we cannot get here and may need to keep getting at least 1 tapwater enema per day to help him vent  his colon upon discharge from the hospital.       Echo 06/19/23: 1. Limited study as pt would not continue after initial apical images;  all views not obtained.   2. Left ventricular ejection fraction, by estimation, is 60 to 65%. The  left ventricle has normal function. The left ventricle has no regional  wall motion abnormalities. Left ventricular diastolic function could not  be evaluated.   3. Right ventricular systolic function is normal. The right ventricular  size is normal.   4. The mitral valve is normal in structure. No evidence of mitral valve  regurgitation.   5. Tricuspid valve regurgitation not assessed.   6. The aortic valve is tricuspid. Aortic valve regurgitation is mild.  Aortic valve sclerosis/calcification is present, without any evidence of  aortic stenosis.    Mr. Oestreicher is a 58 yo male with PMH ICH with residual right weakness and bedbound, progressive leukoencephalopathy, PAD/left ICA stenosis, cortical blindness, HLD, HTN, antiphospholipid syndrome.  Presented to the hospital with complaints of abdominal pain and distention.  Treated for colonic ileus with pseudoobstruction.  GI was consulted.  General surgery was consulted as well.  Conservatively managed.  Was on TPN.  Now diet advanced.  Also continues to have poor p.o. intake.   Assessment and Plan: Pseudoobstruction of colon Presented with abdominal pain and distention. CT scan shows evidence of colonic distention without any obstruction.  Concern for ileus. General surgery was consulted.  NG tube was inserted. Treated conservatively with IV fluid and bowel rest.  Was given TPN while he had bowel rest. GI was consulted as well.   As the ileus resolved patient was gradually advanced Smog enema ordered and tolerated well Patient has been chronically on baclofen .  There is a concern that that can lead to intestinal obstruction as well as ileus.  Recommended patient to taper this medication off (done) -  Currently on full liquid diet. Tolerating well, can ADAT at discharge - Repeat abdominal x-ray on 03/07/2024 shows stable gaseous distention.  No vomiting reported - bowel regimen continued at discharge; outpt followup with GI planned as well  - overall, despite abd remaining distended with hyperpitched sounds, he is asymptomatic and tolerating diet with regular BM. Discussed with GI, and felt to be stable for discharge and if were to worsen again, would need to be considered for re-admission, but at this time he remains stable and improved    Right upper extremity edema. Doppler negative. Monitor   Low-grade fever -resolved Intermittently low-grade temp No further fever Monitor clinically   Mild renal dysfunction.  No AKI. Hypokalemia Hypomagnesemia. Baseline creatinine appears to be around 1.2 Serum creatinine on admission was 1.4 Not meeting AKI criteria Treated with IV hydration Potassium and magnesium  have been replaced   Hyperglycemia Resolved Hemoglobin A1c 5.8   Hypophosphatemia Corrected   HLD On statin.   Chronic spasticity On gabapentin  Tapering of baclofen  baclofen  dose due to with concern for ileus   Cerebral myeloid angiopathy History of intracerebral hemorrhage Progressive leukoencephalopathy Spastic hemiparesis of right side Sees neurology outpatient    Past Medical History:  Diagnosis Date   Anxiety    Arrhythmia as indication for cardiac pacemaker replacement 03/2020   Frequent falls 03/2020   History of loop recorder 04/2017  ICH (intracerebral hemorrhage) (HCC) 06/19/2023   Stroke (cerebrum) (HCC)    Tobacco use    Urinary frequency 09/2019   Vitamin D  deficiency      Past Surgical History:  Procedure Laterality Date   FOOT SURGERY     LOOP RECORDER INSERTION N/A 04/13/2017   Procedure: Loop Recorder Insertion;  Surgeon: Tammie Fall, MD;  Location: [facility] INVASIVE CV LAB;  Service: Cardiovascular;  Laterality: N/A;   TEE WITHOUT  CARDIOVERSION N/A 04/13/2017   Procedure: TRANSESOPHAGEAL ECHOCARDIOGRAM (TEE);  Surgeon: Hugh Madura, MD;  Location: [facility] ENDOSCOPY;  Service: Cardiovascular;  Laterality: N/A;   Family History  Problem Relation Age of Onset   Diabetes Mother    COPD Mother    Diabetes Father    Heart failure Father    Cancer Sister    Social History   Tobacco Use   Smoking status: Every Day    Current packs/day: 1.00    Average packs/day: 1 pack/day for 46.4 years (46.4 ttl pk-yrs)    Types: Cigarettes    Start date: 11/01/1977   Smokeless tobacco: Never  Vaping Use   Vaping status: Never Used  Substance Use Topics   Alcohol use: No   Drug use: No   Current Outpatient Medications  Medication Sig Dispense Refill   acetaminophen  (TYLENOL ) 325 MG tablet Take 2 tablets (650 mg total) by mouth every 6 (six) hours as needed for mild pain, moderate pain, fever or headache (or temp > 37.5 C (99.5 F)).     cholecalciferol  (CHOLECALCIFEROL ) 25 MCG tablet Take 1 tablet (1,000 Units total) by mouth daily.     cyanocobalamin  1000 MCG tablet Take 1 tablet (1,000 mcg total) by mouth daily.     feeding supplement (ENSURE ENLIVE / ENSURE PLUS) LIQD Take 237 mLs by mouth 2 (two) times daily between meals. 237 mL 12   gabapentin  (NEURONTIN ) 300 MG capsule TAKE 1 CAPSULE BY MOUTH BY MOUTH TWO TIMES DAILY (Patient taking differently: Take 300 mg by mouth 2 (two) times daily.) 180 capsule 3   ipratropium-albuterol (DUONEB) 0.5-2.5 (3) MG/3ML SOLN Take 3 mLs by nebulization every 6 (six) hours as needed (SOB, wheezing).     metoCLOPramide  (REGLAN ) 5 MG tablet Take 1 tablet (5 mg total) by mouth 3 (three) times daily before meals for 7 days. 21 tablet 0   nutrition supplement, JUVEN, (JUVEN) PACK Take 1 packet by mouth 2 (two) times daily between meals.     pantoprazole  (PROTONIX ) 40 MG tablet Take 1 tablet (40 mg total) by mouth at bedtime.     polyethylene glycol (MIRALAX  / GLYCOLAX ) 17 g packet Take 17 g by mouth 2  (two) times daily.     simethicone  (MYLICON) 80 MG chewable tablet Chew 1 tablet (80 mg total) by mouth 4 (four) times daily -  before meals and at bedtime. 30 tablet 0   Sorbitol  (SMOG) 960 mL SOLN enema Place 960 mLs rectally every other day.     No current facility-administered medications for this visit.   No Known Allergies   Review of Systems: All systems reviewed and negative except where noted in HPI.    DG Abd 2 Views Result Date: 03/07/2024 CLINICAL DATA:  Ileus. EXAM: ABDOMEN - 2 VIEW COMPARISON:  Mar 03, 2024. FINDINGS: Mildly distended and air-filled colon is noted suggesting ileus. No small bowel dilatation is noted. IMPRESSION: Grossly stable gaseous distention of the colon. Electronically Signed   By: Rosalene Colon M.D.   On:  03/07/2024 17:28    Physical Exam: There were no vitals taken for this visit. Constitutional: Pleasant,well-developed, ***male in no acute distress. HEENT: Normocephalic and atraumatic. Conjunctivae are normal. No scleral icterus. Neck supple.  Cardiovascular: Normal rate, regular rhythm.  Pulmonary/chest: Effort normal and breath sounds normal. No wheezing, rales or rhonchi. Abdominal: Soft, nondistended, nontender. Bowel sounds active throughout. There are no masses palpable. No hepatomegaly. Extremities: no edema Lymphadenopathy: No cervical adenopathy noted. Neurological: Alert and oriented to person place and time. Skin: Skin is warm and dry. No rashes noted. Psychiatric: Normal mood and affect. Behavior is normal.   ASSESSMENT: 58 y.o. male here for assessment of the following  No diagnosis found.  PLAN:   Paseda, Folashade R, [name]

## 2024-04-13 ENCOUNTER — Emergency Department (HOSPITAL_COMMUNITY)

## 2024-04-13 ENCOUNTER — Inpatient Hospital Stay (HOSPITAL_COMMUNITY)
Admission: EM | Admit: 2024-04-13 | Discharge: 2024-04-18 | DRG: 389 | Disposition: A | Discharge status: Other Acute Inpatient Hospital | Source: Skilled Nursing Facility | Attending: Internal Medicine | Admitting: Internal Medicine

## 2024-04-13 ENCOUNTER — Encounter (HOSPITAL_COMMUNITY): Payer: Self-pay | Admitting: Emergency Medicine

## 2024-04-13 ENCOUNTER — Other Ambulatory Visit: Payer: Self-pay

## 2024-04-13 DIAGNOSIS — I69151 Hemiplegia and hemiparesis following nontraumatic intracerebral hemorrhage affecting right dominant side: Secondary | ICD-10-CM

## 2024-04-13 DIAGNOSIS — D6861 Antiphospholipid syndrome: Secondary | ICD-10-CM | POA: Diagnosis present

## 2024-04-13 DIAGNOSIS — E785 Hyperlipidemia, unspecified: Secondary | ICD-10-CM | POA: Diagnosis present

## 2024-04-13 DIAGNOSIS — Z79899 Other long term (current) drug therapy: Secondary | ICD-10-CM

## 2024-04-13 DIAGNOSIS — L89519 Pressure ulcer of right ankle, unspecified stage: Secondary | ICD-10-CM | POA: Diagnosis present

## 2024-04-13 DIAGNOSIS — I10 Essential (primary) hypertension: Secondary | ICD-10-CM | POA: Diagnosis present

## 2024-04-13 DIAGNOSIS — R9431 Abnormal electrocardiogram [ECG] [EKG]: Secondary | ICD-10-CM | POA: Insufficient documentation

## 2024-04-13 DIAGNOSIS — Z8249 Family history of ischemic heart disease and other diseases of the circulatory system: Secondary | ICD-10-CM | POA: Diagnosis not present

## 2024-04-13 DIAGNOSIS — F17210 Nicotine dependence, cigarettes, uncomplicated: Secondary | ICD-10-CM | POA: Diagnosis present

## 2024-04-13 DIAGNOSIS — E876 Hypokalemia: Secondary | ICD-10-CM | POA: Diagnosis present

## 2024-04-13 DIAGNOSIS — R9389 Abnormal findings on diagnostic imaging of other specified body structures: Secondary | ICD-10-CM

## 2024-04-13 DIAGNOSIS — Z833 Family history of diabetes mellitus: Secondary | ICD-10-CM

## 2024-04-13 DIAGNOSIS — L8990 Pressure ulcer of unspecified site, unspecified stage: Secondary | ICD-10-CM | POA: Diagnosis present

## 2024-04-13 DIAGNOSIS — Z7401 Bed confinement status: Secondary | ICD-10-CM

## 2024-04-13 DIAGNOSIS — Z825 Family history of asthma and other chronic lower respiratory diseases: Secondary | ICD-10-CM

## 2024-04-13 DIAGNOSIS — I739 Peripheral vascular disease, unspecified: Secondary | ICD-10-CM | POA: Diagnosis present

## 2024-04-13 DIAGNOSIS — K567 Ileus, unspecified: Secondary | ICD-10-CM | POA: Diagnosis present

## 2024-04-13 DIAGNOSIS — Z8673 Personal history of transient ischemic attack (TIA), and cerebral infarction without residual deficits: Secondary | ICD-10-CM | POA: Diagnosis not present

## 2024-04-13 LAB — COMPREHENSIVE METABOLIC PANEL WITH GFR
ALT: 13 U/L (ref 0–44)
AST: 17 U/L (ref 15–41)
Albumin: 3 g/dL — ABNORMAL LOW (ref 3.5–5.0)
Alkaline Phosphatase: 108 U/L (ref 38–126)
Anion gap: 17 — ABNORMAL HIGH (ref 5–15)
BUN: 17 mg/dL (ref 6–20)
CO2: 25 mmol/L (ref 22–32)
Calcium: 8.7 mg/dL — ABNORMAL LOW (ref 8.9–10.3)
Chloride: 96 mmol/L — ABNORMAL LOW (ref 98–111)
Creatinine, Ser: 1.33 mg/dL — ABNORMAL HIGH (ref 0.61–1.24)
GFR, Estimated: >60 mL/min (ref >60)
Glucose, Bld: 95 mg/dL (ref 70–99)
Potassium: 2.5 mmol/L — CL (ref 3.5–5.1)
Sodium: 138 mmol/L (ref 135–145)
Total Bilirubin: 0.7 mg/dL (ref 0.0–1.2)
Total Protein: 7.6 g/dL (ref 6.5–8.1)

## 2024-04-13 LAB — URINALYSIS, ROUTINE W REFLEX MICROSCOPIC
Bilirubin Urine: NEGATIVE
Glucose, UA: NEGATIVE mg/dL
Hgb urine dipstick: NEGATIVE
Ketones, ur: NEGATIVE mg/dL
Leukocytes,Ua: NEGATIVE
Nitrite: NEGATIVE
Protein, ur: NEGATIVE mg/dL
Specific Gravity, Urine: 1.031 — ABNORMAL HIGH (ref 1.005–1.030)
pH: 6 (ref 5.0–8.0)

## 2024-04-13 LAB — CBC
HCT: 41.3 % (ref 39.0–52.0)
Hemoglobin: 13.4 g/dL (ref 13.0–17.0)
MCH: 27.3 pg (ref 26.0–34.0)
MCHC: 32.4 g/dL (ref 30.0–36.0)
MCV: 84.1 fL (ref 80.0–100.0)
Platelets: 329 10*3/uL (ref 150–400)
RBC: 4.91 MIL/uL (ref 4.22–5.81)
RDW: 14.2 % (ref 11.5–15.5)
WBC: 9.3 10*3/uL (ref 4.0–10.5)
nRBC: 0 % (ref 0.0–0.2)

## 2024-04-13 LAB — MAGNESIUM: Magnesium: 1.8 mg/dL (ref 1.7–2.4)

## 2024-04-13 LAB — PROCALCITONIN: Procalcitonin: <0.1 ng/mL

## 2024-04-13 LAB — TSH: TSH: 0.936 u[IU]/mL (ref 0.350–4.500)

## 2024-04-13 LAB — LACTIC ACID, PLASMA: Lactic Acid, Venous: 1.7 mmol/L (ref 0.5–1.9)

## 2024-04-13 MED ORDER — ONDANSETRON HCL 4 MG PO TABS: 4.0000 mg | ORAL_TABLET | Freq: Four times a day (QID) | ORAL | Status: DC | PRN

## 2024-04-13 MED ORDER — SODIUM CHLORIDE 0.9 % IV SOLN: INTRAVENOUS | Status: AC

## 2024-04-13 MED ORDER — LACTATED RINGERS IV BOLUS: 500.0000 mL | Freq: Once | INTRAVENOUS | Status: AC

## 2024-04-13 MED ORDER — AZITHROMYCIN 250 MG PO TABS
500.0000 mg | ORAL_TABLET | Freq: Once | ORAL | Status: DC
  Filled 2024-04-13: qty 2

## 2024-04-13 MED ORDER — POTASSIUM CHLORIDE 10 MEQ/100ML IV SOLN
10.0000 meq | INTRAVENOUS | Status: AC
  Filled 2024-04-13 (×4): qty 100

## 2024-04-13 MED ORDER — ONDANSETRON HCL 4 MG/2ML IJ SOLN: 4.0000 mg | Freq: Four times a day (QID) | INTRAMUSCULAR | Status: DC | PRN

## 2024-04-13 MED ORDER — IOHEXOL 300 MG/ML  SOLN: 100.0000 mL | Freq: Once | INTRAMUSCULAR | Status: AC | PRN

## 2024-04-13 MED ORDER — ACETAMINOPHEN 325 MG PO TABS
650.0000 mg | ORAL_TABLET | Freq: Four times a day (QID) | ORAL | Status: AC | PRN
Start: 2024-04-13 — End: ?
  Administered 2024-04-16: 650 mg via ORAL
  Filled 2024-04-13: qty 2

## 2024-04-13 MED ORDER — ACETAMINOPHEN 650 MG RE SUPP: 650.0000 mg | Freq: Four times a day (QID) | RECTAL | Status: DC | PRN

## 2024-04-13 MED ORDER — SODIUM CHLORIDE 0.9 % IV SOLN
1.0000 g | Freq: Once | INTRAVENOUS | Status: AC
  Filled 2024-04-13: qty 10

## 2024-04-13 NOTE — Assessment & Plan Note (Signed)
 No medication, monitor

## 2024-04-13 NOTE — Assessment & Plan Note (Signed)
 Right lateral malleolus with scabbed over pressure wound, about 2cm.  No evidence of infection. Healed over. Continue boot, turning

## 2024-04-13 NOTE — ED Triage Notes (Signed)
 Pt BIB EMS from [facility], no bowel movement x 3 days. Diagnosed with colonic ileus, 3/10 pain. Left sided weakness from previous stroke, facial droop at baseline.    BP 90/60 P 87 SpO2 98% CBG 118

## 2024-04-13 NOTE — ED Provider Notes (Signed)
 [place] EMERGENCY DEPARTMENT AT [facility] Provider Note   CSN: [id number] Arrival date & time: 04/13/24  1130     Patient presents with: No chief complaint on file.   Daniel Stuart is a 58 y.o. male.   HPI History limited by patient's aphasia  58 year old male history of CVA, PVD, chronically bedbound and in [facility] facility who presents today with no bowel movement for 3 days and increasing abdominal distention.  Patient complains of abdominal pain, decreased p.o. intake, and no bowel movement.  Prior to Admission medications   Medication Sig Start Date End Date Taking? Authorizing Provider  acetaminophen  (TYLENOL ) 325 MG tablet Take 2 tablets (650 mg total) by mouth every 6 (six) hours as needed for mild pain, moderate pain, fever or headache (or temp > 37.5 C (99.5 F)). 06/22/23   Hongalgi, Anand D, MD  cholecalciferol  (CHOLECALCIFEROL ) 25 MCG tablet Take 1 tablet (1,000 Units total) by mouth daily. 03/09/24 04/17/24  Faith Homes, MD  cyanocobalamin  1000 MCG tablet Take 1 tablet (1,000 mcg total) by mouth daily. 09/11/22   Gwendalyn Lemma, MD  feeding supplement (ENSURE ENLIVE / ENSURE PLUS) LIQD Take 237 mLs by mouth 2 (two) times daily between meals. 03/02/24   Kraig Peru, MD  gabapentin  (NEURONTIN ) 300 MG capsule TAKE 1 CAPSULE BY MOUTH BY MOUTH TWO TIMES DAILY Patient taking differently: Take 300 mg by mouth 2 (two) times daily. 04/22/22 02/23/24  Merriam Abbey, DO  ipratropium-albuterol (DUONEB) 0.5-2.5 (3) MG/3ML SOLN Take 3 mLs by nebulization every 6 (six) hours as needed (SOB, wheezing).    [provider]  metoCLOPramide  (REGLAN ) 5 MG tablet Take 1 tablet (5 mg total) by mouth 3 (three) times daily before meals for 7 days. 03/02/24 03/09/24  Patel, Pranav M, MD  nutrition supplement, JUVEN, (JUVEN) PACK Take 1 packet by mouth 2 (two) times daily between meals. 03/09/24   Faith Homes, MD  pantoprazole  (PROTONIX ) 40 MG tablet Take 1 tablet  (40 mg total) by mouth at bedtime. 06/22/23   Hongalgi, Anand D, MD  polyethylene glycol (MIRALAX  / GLYCOLAX ) 17 g packet Take 17 g by mouth 2 (two) times daily. 03/09/24   Faith Homes, MD  simethicone  (MYLICON) 80 MG chewable tablet Chew 1 tablet (80 mg total) by mouth 4 (four) times daily -  before meals and at bedtime. 03/02/24   Kraig Peru, MD  Sorbitol  (SMOG) 960 mL SOLN enema Place 960 mLs rectally every other day. 03/10/24   Faith Homes, MD    Allergies: Patient has no known allergies.    Review of Systems  Updated Vital Signs BP 97/71 (BP Location: Right Arm)   Pulse 79   Temp 98.3 F (36.8 C) (Oral)   Resp 17   SpO2 95%   Physical Exam Vitals reviewed.  HENT:     Head: Normocephalic.     Right Ear: External ear normal.     Left Ear: External ear normal.     Nose: Nose normal.     Mouth/Throat:     Comments: Because membranes are dry  Eyes:     Pupils: Pupils are equal, round, and reactive to light.    Cardiovascular:     Rate and Rhythm: Normal rate and regular rhythm.     Pulses: Normal pulses.  Pulmonary:     Effort: Pulmonary effort is normal.  Abdominal:     General: There is distension.     Tenderness: There is abdominal tenderness.  Musculoskeletal:     Cervical back: Normal range of motion.     Comments: Contractures noted   Skin:    General: Skin is warm and dry.     Capillary Refill: Capillary refill takes less than 2 seconds.   Neurological:     Mental Status: He is alert.     Comments: Patient with aphasia He has contractures greater of the right upper extremity and hand    (all labs ordered are listed, but only abnormal results are displayed) Labs Reviewed  COMPREHENSIVE METABOLIC PANEL WITH GFR - Abnormal; Notable for the following components:      Result Value   Potassium 2.5 (*)    Chloride 96 (*)    Creatinine, Ser 1.33 (*)    Calcium  8.7 (*)    Albumin 3.0 (*)    Anion gap 17 (*)    All other components within normal  limits  CBC  URINALYSIS, ROUTINE W REFLEX MICROSCOPIC  MAGNESIUM   PROCALCITONIN    EKG: None  Radiology: CT ABDOMEN PELVIS W CONTRAST Result Date: 04/13/2024 CLINICAL DATA:  Acute abdominal pain, acute nonlocalized. History of colonic ileus EXAM: CT ABDOMEN AND PELVIS WITH CONTRAST TECHNIQUE: Multidetector CT imaging of the abdomen and pelvis was performed using the standard protocol following bolus administration of intravenous contrast. RADIATION DOSE REDUCTION: This exam was performed according to the departmental dose-optimization program which includes automated exposure control, adjustment of the mA and/or kV according to patient size and/or use of iterative reconstruction technique. CONTRAST:  OMNIPAQUE  IOHEXOL  300 MG/ML  SOLN COMPARISON:  CT 03/06/2024 FINDINGS: Lower chest: Segmental consolidation lateral aspect of the RIGHT lower lobe (image 34/series 5 is new finding from prior. Hepatobiliary: No focal hepatic lesion. Normal gallbladder. No biliary duct dilatation. Common bile duct is normal. Pancreas: Pancreas is normal. No ductal dilatation. No pancreatic inflammation. Spleen: Normal spleen Adrenals/urinary tract: Adrenal glands normal. Nonobstructing calculi within the kidneys. Ureters and bladder normal. Stomach/Bowel: Stomach is decompressed. The small bowel is decompressed. No evidence small bowel obstruction. Terminal ileum is normal. Appendix normal. The ascending and transverse colon is gas-filled and distended. Transverse colon measures 7.5 cm diameter. There is fluid stool in the descending colon and rectosigmoid colon. The LEFT colon is ahaustral. Findings are similar to comparison exam with mild increase in gaseous distension. No pneumatosis. No intraperitoneal free air Vascular/Lymphatic: Abdominal aorta is normal caliber. No periportal or retroperitoneal adenopathy. No pelvic adenopathy. Reproductive: Unremarkable Other: No free fluid. Musculoskeletal: No aggressive  osseous lesion. IMPRESSION: 1. Persistent colonic ileus pattern. Gaseous distention of the colon slightly increased. Fluid stool in the a ahaustral left colon. 2. New airspace disease in lateral segment of the RIGHT lower lobe suggest pneumonia. Electronically Signed   By: Deboraha Fallow M.D.   On: 04/13/2024 15:34     Procedures   Medications Ordered in the ED  potassium chloride  10 mEq in 100 mL IVPB (10 mEq Intravenous New Bag/Given 04/13/24 1504)  cefTRIAXone (ROCEPHIN) 1 g in sodium chloride  0.9 % 100 mL IVPB (has no administration in time range)  azithromycin (ZITHROMAX) tablet 500 mg (has no administration in time range)  lactated ringers  bolus 500 mL (0 mLs Intravenous Stopped 04/13/24 1511)  iohexol  (OMNIPAQUE ) 300 MG/ML solution 100 mL (100 mLs Intravenous Contrast Given 04/13/24 1438)    Clinical Course as of 04/13/24 1622  Fri Apr 13, 2024  1447 Complete metabolic panel returned significant for severe hypokalemia with potassium 2.5 Runs of potassium ordered [DR]  1448 CBC reviewed  interpreted and within normal limits [DR]    Clinical Course User Index [DR] Auston Blush, MD                                 Medical Decision Making Amount and/or Complexity of Data Reviewed Labs: ordered. Radiology: ordered.  Risk Prescription drug management.   1 abdominal pain likely secondary to colonic ileus 2 significant hypokalemia being repleted 3 new pneumonia of right lower lobe treated with Rocephin and Zithromax Plan admission to hospitalist team Consult to general surgery Care discussed with Dr. Sullivan Endow, on-call for hospitalist he will see for admission    Final diagnoses:  Ileus [facility])  Hypokalemia    ED Discharge Orders     None          Auston Blush, MD 04/13/24 1622

## 2024-04-13 NOTE — Assessment & Plan Note (Signed)
 Check mangesium Repleted in ED Keep on tele and trend

## 2024-04-13 NOTE — Assessment & Plan Note (Signed)
 Optimize electrolytes Keep on telemetry Avoid qt prolonging drugs  Repeat ekg in AM

## 2024-04-13 NOTE — Assessment & Plan Note (Signed)
 CT abdomen/pelvis reads as new airspace disease in lateral segment of RLL suggests pneumonia; however, he has had no fever/chills, shortness of breath/coughing or other clinical signs/symptoms of pneumonia.  He has no SIRS criteria Denies any signs/symptoms of aspiration Given rocephin and azithromycin in ED Low suspicion of pneumonia.  Checking PCT and can re initiate abx if elevated

## 2024-04-13 NOTE — H&P (Signed)
 History and Physical    Patient: Daniel Stuart WUJ:[id number] DOB: 1966/10/02 DOA: 04/13/2024 DOS: the patient was seen and examined on 04/13/2024 PCP: Paseda, Folashade R, [name]  Patient coming from: SNF - [facility]. Bed bound/WC    Chief Complaint: abdominal distention   HPI: Daniel Stuart is a 58 y.o. male with medical history significant of ICH with residual right sided weakness and bedbound, progressive leukoencephalopathy, PAD/left ICA stenosis, cortical blindness,  PPM, hx of CVA, HTN, HLD, antiphospholipid syndrome and persistent colonic ileus who presented to ED with concerns for abdominal distention.   Sister gives history. When he was discharged from the hospital he has only been on a liquid diet. Discharge summary reports advance diet as tolerated, but the SNF has not done this. He had a GI follow up on 04/02/24, but missed this appointment due to facility taking him to wrong facility. Yesterday they noticed that his stomach was more distended and a KUB was taken. He was then sent to ED. He has had no abdominal pain, no N/V/D. Unsure about normal BM as he has been on liquid diet only. He is unsure about flatus.  He has had no complaints. No fever/chills, no shortness of breath or coughing.   Admitted in may 2025 for pseudo obstruction of colon and managed conservatively. Followed by [facility] for persistent colonic ileus.    Denies any fever/chills, vision changes/headaches, chest pain or palpitations, shortness of breath or cough, abdominal pain, N/V/D, dysuria or leg swelling. He has a wound on his ankle that facility has been treating.   He has not smoked in over a year and does not drink alcohol.   ER Course:  vitals: afebrile, bp: 104/81, HR :81, RR: 16, oxygen: 95% RA Pertinent labs: potassium: 2.5, creatinine: 1.33,  CT abdomen/pelvis: persistent colonic ileus pattern. Gaseous distention of colon slightly increased. New airspace disease in lateral segment of RLL  suggests pneumonia.  In ED: general surgery consulted. Given rocephin and azithromycin. 500cc IVF bolus, potassium. TRH asked to admit.    Review of Systems: As mentioned in the history of present illness. All other systems reviewed and are negative. Past Medical History:  Diagnosis Date   Anxiety    Arrhythmia as indication for cardiac pacemaker replacement 03/2020   Frequent falls 03/2020   History of loop recorder 04/2017   ICH (intracerebral hemorrhage) (HCC) 06/19/2023   Stroke (cerebrum) (HCC)    Tobacco use    Urinary frequency 09/2019   Vitamin D  deficiency    Past Surgical History:  Procedure Laterality Date   FOOT SURGERY     LOOP RECORDER INSERTION N/A 04/13/2017   Procedure: Loop Recorder Insertion;  Surgeon: Tammie Fall, MD;  Location: [facility]VASIVE CV [facility];  Service: Cardiovascular;  Laterality: N/A;   TEE WITHOUT CARDIOVERSION N/A 04/13/2017   Procedure: TRANSESOPHAGEAL ECHOCARDIOGRAM (TEE);  Surgeon: Hugh Madura, MD;  Location: [facility] ENDOSCOPY;  Service: Cardiovascular;  Laterality: N/A;   Social History:  reports that he has been smoking cigarettes. He started smoking about 46 years ago. He has a 46.4 pack-year smoking history. He has never used smokeless tobacco. He reports that he does not drink alcohol and does not use drugs.  No Known Allergies  Family History  Problem Relation Age of Onset   Diabetes Mother    COPD Mother    Diabetes Father    Heart failure Father    Cancer Sister     Prior to Admission medications   Medication Sig  Start Date End Date Taking? Authorizing Provider  acetaminophen  (TYLENOL ) 325 MG tablet Take 2 tablets (650 mg total) by mouth every 6 (six) hours as needed for mild pain, moderate pain, fever or headache (or temp > 37.5 C (99.5 F)). 06/22/23   Hongalgi, Anand D, MD  cholecalciferol  (CHOLECALCIFEROL ) 25 MCG tablet Take 1 tablet (1,000 Units total) by mouth daily. 03/09/24 04/17/24  Faith Homes, MD  cyanocobalamin  1000 MCG  tablet Take 1 tablet (1,000 mcg total) by mouth daily. 09/11/22   Gwendalyn Lemma, MD  feeding supplement (ENSURE ENLIVE / ENSURE PLUS) LIQD Take 237 mLs by mouth 2 (two) times daily between meals. 03/02/24   Kraig Peru, MD  gabapentin  (NEURONTIN ) 300 MG capsule TAKE 1 CAPSULE BY MOUTH BY MOUTH TWO TIMES DAILY Patient taking differently: Take 300 mg by mouth 2 (two) times daily. 04/22/22 02/23/24  Merriam Abbey, DO  ipratropium-albuterol (DUONEB) 0.5-2.5 (3) MG/3ML SOLN Take 3 mLs by nebulization every 6 (six) hours as needed (SOB, wheezing).    [provider]  metoCLOPramide  (REGLAN ) 5 MG tablet Take 1 tablet (5 mg total) by mouth 3 (three) times daily before meals for 7 days. 03/02/24 03/09/24  Patel, Pranav M, MD  nutrition supplement, JUVEN, (JUVEN) PACK Take 1 packet by mouth 2 (two) times daily between meals. 03/09/24   Faith Homes, MD  pantoprazole  (PROTONIX ) 40 MG tablet Take 1 tablet (40 mg total) by mouth at bedtime. 06/22/23   Hongalgi, Anand D, MD  polyethylene glycol (MIRALAX  / GLYCOLAX ) 17 g packet Take 17 g by mouth 2 (two) times daily. 03/09/24   Faith Homes, MD  simethicone  (MYLICON) 80 MG chewable tablet Chew 1 tablet (80 mg total) by mouth 4 (four) times daily -  before meals and at bedtime. 03/02/24   Kraig Peru, MD  Sorbitol  (SMOG) 960 mL SOLN enema Place 960 mLs rectally every other day. 03/10/24   Faith Homes, MD    Physical Exam: Vitals:   04/13/24 1718 04/13/24 1809 04/13/24 1913 04/13/24 2000  BP: 105/77 113/81 113/81 101/78  Pulse: 85 85 85 88  Resp: 12 16 16 18   Temp:  97.8 F (36.6 C) 97.8 F (36.6 C) 98.5 F (36.9 C)  TempSrc: Oral Oral Oral Oral  SpO2: 95% 98%  97%  Weight:   67.9 kg   Height:   5' 6 (1.676 m)    General:  Appears calm and comfortable and is in NAD Eyes:  PERRL, EOMI, normal lids, iris ENT:  grossly normal hearing, lips & tongue, dry mucous membranes; appropriate dentition Neck:  no LAD, masses or thyromegaly; no carotid  bruits Cardiovascular:  RRR, no m/r/g. No LE edema.  Respiratory:   CTA bilaterally with no wheezes/rales/rhonchi.  Normal respiratory effort. Abdomen:  distended abdomen with high pitched bowel sounds. Non tender to palpation  Back:   normal alignment, no CVAT Skin:  no rash or induration seen on limited exam. Scabbed over wound on right lateral malleolus. No erythema, drainage.  Musculoskeletal:  decreased strength of RUE and RLE, at baseline. Left UE with some contracture.  Lower extremity:  No LE edema.  Limited foot exam with no ulcerations.  2+ distal pulses. Scabbed over wound, about 2cm on right lateral malleolus  Psychiatric:  grossly normal mood and affect, speech fluent and appropriate, AOx3 Neurologic:  CN 2-12 grossly intact, moves all extremities in coordinated fashion, sensation intact   Radiological Exams on Admission: Independently reviewed - see discussion in A/P where applicable  CT ABDOMEN PELVIS W CONTRAST Result Date: 04/13/2024 CLINICAL DATA:  Acute abdominal pain, acute nonlocalized. History of colonic ileus EXAM: CT ABDOMEN AND PELVIS WITH CONTRAST TECHNIQUE: Multidetector CT imaging of the abdomen and pelvis was performed using the standard protocol following bolus administration of intravenous contrast. RADIATION DOSE REDUCTION: This exam was performed according to the departmental dose-optimization program which includes automated exposure control, adjustment of the mA and/or kV according to patient size and/or use of iterative reconstruction technique. CONTRAST:  OMNIPAQUE  IOHEXOL  300 MG/ML  SOLN COMPARISON:  CT 03/06/2024 FINDINGS: Lower chest: Segmental consolidation lateral aspect of the RIGHT lower lobe (image 34/series 5 is new finding from prior. Hepatobiliary: No focal hepatic lesion. Normal gallbladder. No biliary duct dilatation. Common bile duct is normal. Pancreas: Pancreas is normal. No ductal dilatation. No pancreatic inflammation. Spleen: Normal spleen  Adrenals/urinary tract: Adrenal glands normal. Nonobstructing calculi within the kidneys. Ureters and bladder normal. Stomach/Bowel: Stomach is decompressed. The small bowel is decompressed. No evidence small bowel obstruction. Terminal ileum is normal. Appendix normal. The ascending and transverse colon is gas-filled and distended. Transverse colon measures 7.5 cm diameter. There is fluid stool in the descending colon and rectosigmoid colon. The LEFT colon is ahaustral. Findings are similar to comparison exam with mild increase in gaseous distension. No pneumatosis. No intraperitoneal free air Vascular/Lymphatic: Abdominal aorta is normal caliber. No periportal or retroperitoneal adenopathy. No pelvic adenopathy. Reproductive: Unremarkable Other: No free fluid. Musculoskeletal: No aggressive osseous lesion. IMPRESSION: 1. Persistent colonic ileus pattern. Gaseous distention of the colon slightly increased. Fluid stool in the a ahaustral left colon. 2. New airspace disease in lateral segment of the RIGHT lower lobe suggest pneumonia. Electronically Signed   By: Deboraha Fallow M.D.   On: 04/13/2024 15:34    EKG: Independently reviewed.  NSR with rate 83; nonspecific ST changes with no evidence of acute ischemia. Prolonged QT    Labs on Admission: I have personally reviewed the available labs and imaging studies at the time of the admission.  Pertinent labs:   potassium: 2.5,  creatinine: 1.33,  Assessment and Plan: Principal Problem:   Ileus (HCC) Active Problems:   Hypokalemia   airspace disease   History of intracranial hemorrhage History of severe encephalopathy History of CVA/TIA/progressive leukoencephalopathy and chronic left ICA stenosis   Antiphospholipid syndrome (HCC)   Pressure injury of skin   Essential hypertension   Prolonged QT interval    Assessment and Plan: * Ileus (HCC) 58 year old bedbound male with history of persistent ileus presented to ED after being examined and  found to have increased abdominal distention with KUB findings of ileus.  -admit to tele -he has no abdominal pain, N/V/D or complaints  -CT abdomen/pelvis with persistent colonic ileus pattern. Slightly increased from previous imaging.  -potassium significantly low at 2.5, will need to replete and check magnesium   -GI consulted, f/u on recommendations  -lactic acid wnl. If abnormal place NG tube per GI  -NPO except for ice chips/meds for now  -encourage getting him up and out of bed  -gentle IVF  -would benefit from work on gut health/biome   Hypokalemia Check mangesium Repleted in ED Keep on tele and trend   airspace disease CT abdomen/pelvis reads as new airspace disease in lateral segment of RLL suggests pneumonia; however, he has had no fever/chills, shortness of breath/coughing or other clinical signs/symptoms of pneumonia.  He has no SIRS criteria Denies any signs/symptoms of aspiration Given rocephin and azithromycin in ED Low suspicion  of pneumonia.  Checking PCT and can re initiate abx if elevated   History of intracranial hemorrhage History of severe encephalopathy History of CVA/TIA/progressive leukoencephalopathy and chronic left ICA stenosis -Patient has history of severe progressive encephalopathy and intracranial hemorrhage.  Previously was on aspirin , Eliquis and Plavix  everything has been discontinued after patient developed intracranial hemorrhage in 2024.     Antiphospholipid syndrome (HCC) Off all AC s/p ICH in 2024   Pressure injury of skin Right lateral malleolus with scabbed over pressure wound, about 2cm.  No evidence of infection. Healed over. Continue boot, turning   Essential hypertension No medication, monitor   Prolonged QT interval Optimize electrolytes Keep on telemetry Avoid qt prolonging drugs  Repeat ekg in AM      Advance Care Planning:   Code Status: Full Code   Consults: general surgery/[facility]   DVT Prophylaxis: TED/SCDs    Family Communication: sister at bedside   Severity of Illness: The appropriate patient status for this patient is INPATIENT. Inpatient status is judged to be reasonable and necessary in order to provide the required intensity of service to ensure the patient's safety. The patient's presenting symptoms, physical exam findings, and initial radiographic and laboratory data in the context of their chronic comorbidities is felt to place them at high risk for further clinical deterioration. Furthermore, it is not anticipated that the patient will be medically stable for discharge from the hospital within 2 midnights of admission.   * I certify that at the point of admission it is my clinical judgment that the patient will require inpatient hospital care spanning beyond 2 midnights from the point of admission due to high intensity of service, high risk for further deterioration and high frequency of surveillance required.*  Author: Raymona Caldwell, MD 04/13/2024 11:29 PM  For on call review [email or web address].

## 2024-04-13 NOTE — Assessment & Plan Note (Addendum)
-  Patient has history of severe progressive encephalopathy and intracranial hemorrhage.  Previously was on aspirin , Eliquis and Plavix  everything has been discontinued after patient developed intracranial hemorrhage in 2024.

## 2024-04-13 NOTE — Assessment & Plan Note (Signed)
 Off all AC s/p ICH in 2024

## 2024-04-13 NOTE — Assessment & Plan Note (Addendum)
 58 year old bedbound male with history of persistent ileus presented to ED after being examined and found to have increased abdominal distention with KUB findings of ileus.  -admit to tele -he has no abdominal pain, N/V/D or complaints  -CT abdomen/pelvis with persistent colonic ileus pattern. Slightly increased from previous imaging.  -potassium significantly low at 2.5, will need to replete and check magnesium   -GI consulted, f/u on recommendations  -lactic acid wnl. If abnormal place NG tube per GI  -NPO except for ice chips/meds for now  -encourage getting him up and out of bed  -gentle IVF  -would benefit from work on gut health/biome

## 2024-04-14 ENCOUNTER — Inpatient Hospital Stay (HOSPITAL_COMMUNITY)

## 2024-04-14 DIAGNOSIS — K567 Ileus, unspecified: Secondary | ICD-10-CM | POA: Diagnosis not present

## 2024-04-14 LAB — COMPREHENSIVE METABOLIC PANEL WITH GFR
ALT: 11 U/L (ref 0–44)
AST: 14 U/L — ABNORMAL LOW (ref 15–41)
Albumin: 2.4 g/dL — ABNORMAL LOW (ref 3.5–5.0)
Alkaline Phosphatase: 86 U/L (ref 38–126)
Anion gap: 13 (ref 5–15)
BUN: 10 mg/dL (ref 6–20)
CO2: 20 mmol/L — ABNORMAL LOW (ref 22–32)
Calcium: 7.8 mg/dL — ABNORMAL LOW (ref 8.9–10.3)
Chloride: 106 mmol/L (ref 98–111)
Creatinine, Ser: 1.09 mg/dL (ref 0.61–1.24)
GFR, Estimated: >60 mL/min (ref >60)
Glucose, Bld: 83 mg/dL (ref 70–99)
Potassium: 2.8 mmol/L — ABNORMAL LOW (ref 3.5–5.1)
Sodium: 139 mmol/L (ref 135–145)
Total Bilirubin: 1 mg/dL (ref 0.0–1.2)
Total Protein: 6.2 g/dL — ABNORMAL LOW (ref 6.5–8.1)

## 2024-04-14 LAB — BASIC METABOLIC PANEL WITH GFR
Anion gap: 13 (ref 5–15)
BUN: 7 mg/dL (ref 6–20)
CO2: 19 mmol/L — ABNORMAL LOW (ref 22–32)
Calcium: 7.5 mg/dL — ABNORMAL LOW (ref 8.9–10.3)
Chloride: 110 mmol/L (ref 98–111)
Creatinine, Ser: 0.99 mg/dL (ref 0.61–1.24)
GFR, Estimated: >60 mL/min (ref >60)
Glucose, Bld: 96 mg/dL (ref 70–99)
Potassium: 3.1 mmol/L — ABNORMAL LOW (ref 3.5–5.1)
Sodium: 142 mmol/L (ref 135–145)

## 2024-04-14 LAB — HIV ANTIBODY (ROUTINE TESTING W REFLEX): HIV Screen 4th Generation wRfx: REACTIVE — AB

## 2024-04-14 LAB — PROCALCITONIN: Procalcitonin: <0.1 ng/mL

## 2024-04-14 LAB — CBC
HCT: 41.1 % (ref 39.0–52.0)
Hemoglobin: 12 g/dL — ABNORMAL LOW (ref 13.0–17.0)
MCH: 26.8 pg (ref 26.0–34.0)
MCHC: 29.2 g/dL — ABNORMAL LOW (ref 30.0–36.0)
MCV: 91.7 fL (ref 80.0–100.0)
Platelets: 284 10*3/uL (ref 150–400)
RBC: 4.48 MIL/uL (ref 4.22–5.81)
RDW: 14.7 % (ref 11.5–15.5)
WBC: 7.8 10*3/uL (ref 4.0–10.5)
nRBC: 0 % (ref 0.0–0.2)

## 2024-04-14 LAB — LACTIC ACID, PLASMA: Lactic Acid, Venous: 1 mmol/L (ref 0.5–1.9)

## 2024-04-14 MED ORDER — MAGNESIUM SULFATE 2 GM/50ML IV SOLN
2.0000 g | Freq: Once | INTRAVENOUS | Status: AC
  Filled 2024-04-14: qty 50

## 2024-04-14 MED ORDER — SODIUM CHLORIDE 0.9% FLUSH: 10.0000 mL | INTRAVENOUS | Status: DC | PRN

## 2024-04-14 MED ORDER — POTASSIUM CHLORIDE 10 MEQ/100ML IV SOLN
10.0000 meq | INTRAVENOUS | Status: AC
  Filled 2024-04-14 (×4): qty 100

## 2024-04-14 MED ORDER — POTASSIUM CHLORIDE 10 MEQ/100ML IV SOLN
10.0000 meq | INTRAVENOUS | Status: AC
  Filled 2024-04-14 (×6): qty 100

## 2024-04-14 NOTE — Consult Note (Signed)
 CROSS COVER [facility] Reason for Consult: Chronic ileus. Referring Physician: [facility] hospitalist  Daniel Stuart is an 58 y.o. male.  HPI: Daniel Stuart is a 58 year old black male with multiple medical problems listed below who has had a problem with chronic illness for several years now.  He has a history of an intracranial hemorrhage with residual right-sided weakness and is bedbound with progressive leukoencephalopathy peripheral artery disease left ICA stenosis cortical blindness hypertension hyperlipidemia antiphospholipid antibody syndrome who presented to the hospital with increasing abdominal distention and constipation. Fortunately he had a large bowel movement this morning and 3 BMs after admission last night [as per my discussion with his nurse].  He denies having abdominal pain nausea or vomiting at the present time.  Past Medical History:  Diagnosis Date   Anxiety    Arrhythmia as indication for cardiac pacemaker replacement 03/2020   Frequent falls 03/2020   History of loop recorder 04/2017   ICH (intracerebral hemorrhage) (HCC) 06/19/2023   Stroke (cerebrum) (HCC)    Tobacco use    Urinary frequency 09/2019   Vitamin D  deficiency     Past Surgical History:  Procedure Laterality Date   FOOT SURGERY     LOOP RECORDER INSERTION N/A 04/13/2017   Procedure: Loop Recorder Insertion;  Surgeon: Tammie Fall, MD;  Location: [facility];  Service: Cardiovascular;  Laterality: N/A;   TEE WITHOUT CARDIOVERSION N/A 04/13/2017   Procedure: TRANSESOPHAGEAL ECHOCARDIOGRAM (TEE);  Surgeon: Hugh Madura, MD;  Location: [facility] [place] ENDOSCOPY;  Service: Cardiovascular;  Laterality: N/A;    Family History  Problem Relation Age of Onset   Diabetes Mother    COPD Mother    Diabetes Father    Heart failure Father    Cancer Sister     Social History:  reports that he has been smoking cigarettes. He started smoking about 46 years ago. He has a 46.4 pack-year smoking history. He has never  used smokeless tobacco. He reports that he does not drink alcohol and does not use drugs.  Allergies: No Known Allergies  Medications: I have reviewed the patient's current medications. Prior to Admission:  Medications Prior to Admission  Medication Sig Dispense Refill Last Dose/Taking   acetaminophen  (TYLENOL ) 325 MG tablet Take 2 tablets (650 mg total) by mouth every 6 (six) hours as needed for mild pain, moderate pain, fever or headache (or temp > 37.5 C (99.5 F)).   Unknown   cholecalciferol  (CHOLECALCIFEROL ) 25 MCG tablet Take 1 tablet (1,000 Units total) by mouth daily.   04/13/2024   cyanocobalamin  1000 MCG tablet Take 1 tablet (1,000 mcg total) by mouth daily.   04/13/2024   feeding supplement (ENSURE ENLIVE / ENSURE PLUS) LIQD Take 237 mLs by mouth 2 (two) times daily between meals. (Patient taking differently: Take 237 mLs by mouth 3 (three) times daily between meals. Chocolate if available) 237 mL 12 04/13/2024   gabapentin  (NEURONTIN ) 300 MG capsule TAKE 1 CAPSULE BY MOUTH BY MOUTH TWO TIMES DAILY (Patient taking differently: Take 300 mg by mouth 2 (two) times daily.) 180 capsule 3 04/13/2024   ipratropium-albuterol (DUONEB) 0.5-2.5 (3) MG/3ML SOLN Take 3 mLs by nebulization every 6 (six) hours as needed (SOB, wheezing).   Unknown   metoCLOPramide  (REGLAN ) 5 MG tablet Take 1 tablet (5 mg total) by mouth 3 (three) times daily before meals for 7 days. (Patient taking differently: Take 5 mg by mouth every 6 (six) hours.) 21 tablet 0 04/13/2024   nutrition supplement, JUVEN, (JUVEN) PACK  Take 1 packet by mouth 2 (two) times daily between meals.   04/13/2024   pantoprazole  (PROTONIX ) 40 MG tablet Take 1 tablet (40 mg total) by mouth at bedtime.   Past Week   polyethylene glycol (MIRALAX  / GLYCOLAX ) 17 g packet Take 17 g by mouth 2 (two) times daily.   04/13/2024   simethicone  (MYLICON) 80 MG chewable tablet Chew 1 tablet (80 mg total) by mouth 4 (four) times daily -  before meals and at bedtime.  (Patient taking differently: Chew 80 mg by mouth 4 (four) times daily.) 30 tablet 0 04/13/2024   sodium phosphate (FLEET) ENEM Place 1 enema rectally every other day.   04/13/2024   [EXPIRED] doxycycline  (ADOXA) 100 MG tablet Take 100 mg by mouth 2 (two) times daily. (Patient not taking: Reported on 04/14/2024)   Not Taking   Sorbitol  (SMOG) 960 mL SOLN enema Place 960 mLs rectally every other day. (Patient not taking: Reported on 04/14/2024)   Not Taking   Scheduled:  azithromycin  500 mg Oral Once   Continuous:  sodium chloride  75 mL/hr at 04/14/24 0310   potassium chloride  10 mEq (04/14/24 1154)   PRN:acetaminophen  **OR** acetaminophen , ondansetron  **OR** ondansetron  (ZOFRAN ) IV  Results for orders placed or performed during the hospital encounter of 04/13/24 (from the past 48 hours)  CBC     Status: None   Collection Time: 04/13/24 12:53 PM  Result Value Ref Range   WBC 9.3 4.0 - 10.5 K/uL   RBC 4.91 4.22 - 5.81 MIL/uL   Hemoglobin 13.4 13.0 - 17.0 g/dL   HCT 16.1 09.6 - 04.5 %   MCV 84.1 80.0 - 100.0 fL   MCH 27.3 26.0 - 34.0 pg   MCHC 32.4 30.0 - 36.0 g/dL   RDW 40.9 81.1 - 91.4 %   Platelets 329 150 - 400 K/uL   nRBC 0.0 0.0 - 0.2 %    Comment: Performed at [facility], [address] [place], [place] [address]  Comprehensive metabolic panel     Status: Abnormal   Collection Time: 04/13/24 12:53 PM  Result Value Ref Range   Sodium 138 135 - 145 mmol/L   Potassium 2.5 (LL) 3.5 - 5.1 mmol/L    Comment: CRITICAL RESULT CALLED TO, READ BACK BY AND VERIFIED WITH [place], D RN @ [phone] 04/13/24. GILBERTL    Chloride 96 (L) 98 - 111 mmol/L   CO2 25 22 - 32 mmol/L   Glucose, Bld 95 70 - 99 mg/dL    Comment: Glucose reference range applies only to samples taken after fasting for at least 8 hours.   BUN 17 6 - 20 mg/dL   Creatinine, Ser 0.86 (H) 0.61 - 1.24 mg/dL   Calcium  8.7 (L) 8.9 - 10.3 mg/dL   Total Protein 7.6 6.5 - 8.1 g/dL   Albumin 3.0 (L) 3.5 -  5.0 g/dL   AST 17 15 - 41 U/L   ALT 13 0 - 44 U/L   Alkaline Phosphatase 108 38 - 126 U/L   Total Bilirubin 0.7 0.0 - 1.2 mg/dL   GFR, Estimated >57 >84 mL/min    Comment: (NOTE) Calculated using the CKD-EPI Creatinine Equation (2021)    Anion gap 17 (H) 5 - 15    Comment: Performed at [facility], [address] [place], [place] [address]  Urinalysis, Routine w reflex microscopic -Urine, Catheterized     Status: Abnormal   Collection Time: 04/13/24  4:13 PM  Result Value  Ref Range   Color, Urine STRAW (A) YELLOW   APPearance CLEAR CLEAR   Specific Gravity, Urine 1.031 (H) 1.005 - 1.030   pH 6.0 5.0 - 8.0   Glucose, UA NEGATIVE NEGATIVE mg/dL   Hgb urine dipstick NEGATIVE NEGATIVE   Bilirubin Urine NEGATIVE NEGATIVE   Ketones, ur NEGATIVE NEGATIVE mg/dL   Protein, ur NEGATIVE NEGATIVE mg/dL   Nitrite NEGATIVE NEGATIVE   Leukocytes,Ua NEGATIVE NEGATIVE    Comment: Performed at [facility], [address], [place], [place] [address]  Magnesium      Status: None   Collection Time: 04/13/24  6:56 PM  Result Value Ref Range   Magnesium  1.8 1.7 - 2.4 mg/dL    Comment: Performed at [facility], [address], [place], [place] [address]  Procalcitonin     Status: None   Collection Time: 04/13/24  6:56 PM  Result Value Ref Range   Procalcitonin <0.10 ng/mL    Comment:        Interpretation: PCT (Procalcitonin) <= 0.5 ng/mL: Systemic infection (sepsis) is not likely. Local bacterial infection is possible. (NOTE)       Sepsis PCT Algorithm           Lower Respiratory Tract                                      Infection PCT Algorithm    ----------------------------     ----------------------------         PCT < 0.25 ng/mL                PCT < 0.10 ng/mL          Strongly encourage             Strongly discourage   discontinuation of antibiotics    initiation of antibiotics    ----------------------------      -----------------------------       PCT 0.25 - 0.50 ng/mL            PCT 0.10 - 0.25 ng/mL               OR       >80% decrease in PCT            Discourage initiation of                                            antibiotics      Encourage discontinuation           of antibiotics    ----------------------------     -----------------------------         PCT >= 0.50 ng/mL              PCT 0.26 - 0.50 ng/mL               AND        <80% decrease in PCT             Encourage initiation of                                             antibiotics  Encourage continuation           of antibiotics    ----------------------------     -----------------------------        PCT >= 0.50 ng/mL                  PCT > 0.50 ng/mL               AND         increase in PCT                  Strongly encourage                                      initiation of antibiotics    Strongly encourage escalation           of antibiotics                                     -----------------------------                                           PCT <= 0.25 ng/mL                                                 OR                                        > 80% decrease in PCT                                      Discontinue / Do not initiate                                             antibiotics  Performed at [facility], [address], [place], [place] [address]   HIV Antibody (routine testing w rflx)     Status: Abnormal   Collection Time: 04/13/24  6:56 PM  Result Value Ref Range   HIV Screen 4th Generation wRfx Reactive (A) Non Reactive    Comment: (NOTE) Reactive result does not distinguish HIV-1 p24 antigen, HIV-1  antibody, HIV-2 antibody, and HIV-1 group O antibody. Results  reactive by HIV Antigen/Antibody EIA must be confirmed. Sent for  confirmation. Performed at [facility], [address], [place], [place] [address]   TSH     Status: None   Collection Time:  04/13/24  6:56 PM  Result Value Ref Range   TSH 0.936 0.350 - 4.500 uIU/mL    Comment: Performed by a 3rd Generation assay with a functional sensitivity of <=0.01 uIU/mL. Performed at [facility], [address], [place], [place] [address]   Lactic acid, plasma     Status: None   Collection Time: 04/13/24  9:34 PM  Result Value Ref Range   Lactic Acid, Venous 1.7 0.5 - 1.9 mmol/L    Comment: Performed at [facility], [address] [place], [place] [address]  Lactic acid, plasma     Status: None   Collection Time: 04/13/24 11:37 PM  Result Value Ref Range   Lactic Acid, Venous 1.0 0.5 - 1.9 mmol/L    Comment: Performed at [facility], [address] [place], [place] [address]  Comprehensive metabolic panel     Status: Abnormal   Collection Time: 04/14/24  5:44 AM  Result Value Ref Range   Sodium 139 135 - 145 mmol/L   Potassium 2.8 (L) 3.5 - 5.1 mmol/L   Chloride 106 98 - 111 mmol/L   CO2 20 (L) 22 - 32 mmol/L   Glucose, Bld 83 70 - 99 mg/dL    Comment: Glucose reference range applies only to samples taken after fasting for at least 8 hours.   BUN 10 6 - 20 mg/dL   Creatinine, Ser 4.74 0.61 - 1.24 mg/dL   Calcium  7.8 (L) 8.9 - 10.3 mg/dL   Total Protein 6.2 (L) 6.5 - 8.1 g/dL   Albumin 2.4 (L) 3.5 - 5.0 g/dL   AST 14 (L) 15 - 41 U/L   ALT 11 0 - 44 U/L   Alkaline Phosphatase 86 38 - 126 U/L   Total Bilirubin 1.0 0.0 - 1.2 mg/dL   GFR, Estimated >25 >95 mL/min    Comment: (NOTE) Calculated using the CKD-EPI Creatinine Equation (2021)    Anion gap 13 5 - 15    Comment: Performed at [facility], [address], [place], [place] [address]    CT ABDOMEN PELVIS W CONTRAST Result Date: 04/13/2024 CLINICAL DATA:  Acute abdominal pain, acute nonlocalized. History of colonic ileus EXAM: CT ABDOMEN AND PELVIS WITH CONTRAST TECHNIQUE: Multidetector CT imaging of the abdomen and pelvis was performed using  the standard protocol following bolus administration of intravenous contrast. RADIATION DOSE REDUCTION: This exam was performed according to the departmental dose-optimization program which includes automated exposure control, adjustment of the mA and/or kV according to patient size and/or use of iterative reconstruction technique. CONTRAST:  OMNIPAQUE  IOHEXOL  300 MG/ML  SOLN COMPARISON:  CT 03/06/2024 FINDINGS: Lower chest: Segmental consolidation lateral aspect of the RIGHT lower lobe (image 34/series 5 is new finding from prior. Hepatobiliary: No focal hepatic lesion. Normal gallbladder. No biliary duct dilatation. Common bile duct is normal. Pancreas: Pancreas is normal. No ductal dilatation. No pancreatic inflammation. Spleen: Normal spleen Adrenals/urinary tract: Adrenal glands normal. Nonobstructing calculi within the kidneys. Ureters and bladder normal. Stomach/Bowel: Stomach is decompressed. The small bowel is decompressed. No evidence small bowel obstruction. Terminal ileum is normal. Appendix normal. The ascending and transverse colon is gas-filled and distended. Transverse colon measures 7.5 cm diameter. There is fluid stool in the descending colon and rectosigmoid colon. The LEFT colon is ahaustral. Findings are similar to comparison exam with mild increase in gaseous distension. No pneumatosis. No intraperitoneal free air Vascular/Lymphatic: Abdominal aorta is normal caliber. No periportal or retroperitoneal adenopathy. No pelvic adenopathy. Reproductive: Unremarkable Other: No free fluid. Musculoskeletal: No aggressive osseous lesion. IMPRESSION: 1. Persistent colonic ileus pattern. Gaseous distention of the colon slightly increased. Fluid stool in the a ahaustral left colon. 2. New airspace disease in lateral segment of the RIGHT lower lobe suggest pneumonia. Electronically Signed   By: Deboraha Fallow M.D.   On: 04/13/2024 15:34  Review of Systems Blood pressure 106/79, pulse 94,  temperature 98.3 F (36.8 C), temperature source Oral, resp. rate 18, height 5' 6 (1.676 m), weight 67.9 kg, SpO2 95%. Physical Exam Constitutional:      General: He is not in acute distress.    Appearance: He is ill-appearing.  HENT:     Head: Normocephalic and atraumatic.     Mouth/Throat:     Mouth: Mucous membranes are dry.   Eyes:     Extraocular Movements: Extraocular movements intact.     Pupils: Pupils are equal, round, and reactive to light.    Cardiovascular:     Rate and Rhythm: Normal rate and regular rhythm.  Pulmonary:     Effort: Pulmonary effort is normal.     Breath sounds: Normal breath sounds.  Abdominal:     General: There is distension.     Tenderness: There is no abdominal tenderness. There is no guarding.     Comments: Patient has no bowel sounds but abdomen is nontender on palpation   Musculoskeletal:     Cervical back: Neck supple.   Skin:    General: Skin is warm and dry.   Assessment/Plan: 1) Chronic ileus secondary to immobility with worsening distention on recent scan-seems to have improved clinically today with more BMs today.  Monitor electrolytes and replete as needed.  Would help to change the patient's position every 2-3 hours in bed.  Liberal fluid intake would also be helpful 2) hypokalemia-needs repletion ASAP.Aaron Aas 3) airspace disease noted on CT of the abdomen pelvis the question right lower lobe pneumonia which is not clinically apparent at this time. 4) History of intracranial hemorrhage with severe encephalopathy/CVA/chronic left ICA stenosis. 5) Antiphospholipid antibody syndrome. 6) HTN.   Tami Falcon 04/14/2024, 10:20 AM

## 2024-04-14 NOTE — Plan of Care (Signed)

## 2024-04-14 NOTE — Plan of Care (Signed)

## 2024-04-14 NOTE — Progress Notes (Signed)
 Progress Note    XZAVIER SWINGER  NFA:[id number] DOB: 19-Apr-1966  DOA: 04/13/2024 PCP: Paseda, Folashade R, [name]    Brief Narrative:      Daniel Stuart is an 58 y.o. male  with medical history significant of ICH with residual right sided weakness and bedbound, progressive leukoencephalopathy, PAD/left ICA stenosis, cortical blindness,  PPM, hx of CVA, HTN, HLD, antiphospholipid syndrome and persistent colonic ileus who presented to ED with concerns for abdominal distention.    Sister gives history. When he was discharged from the hospital he has only been on a liquid diet. Discharge summary reports advance diet as tolerated, but the SNF has not done this. He had a GI follow up on 04/02/24, but missed this appointment due to facility taking him to wrong facility. Yesterday they noticed that his stomach was more distended and a KUB was taken. He was then sent to ED. He has had no abdominal pain, no N/V/D. Unsure about normal BM as he has been on liquid diet only. He is unsure about flatus.  He has had no complaints. No fever/chills, no shortness of breath or coughing.    Admitted in may 2025 for pseudo obstruction of colon and managed conservatively. Followed by [facility] for persistent colonic ileus.     Assessment/Plan:   Principal Problem:   Ileus (HCC) Active Problems:   Hypokalemia   airspace disease   History of intracranial hemorrhage History of severe encephalopathy History of CVA/TIA/progressive leukoencephalopathy and chronic left ICA stenosis   Antiphospholipid syndrome (HCC)   Pressure injury of skin   Essential hypertension   Prolonged QT interval    Ileus (HCC) 58 year old bedbound male with history of persistent ileus presented to ED after being examined and found to have increased abdominal distention  -he has no abdominal pain, N/V/D or complaints  -CT abdomen/pelvis with persistent colonic ileus pattern. Slightly increased from previous imaging.  -  Aggressively replete potassium and magnesium  -GI consulted, f/u on recommendations  -lactic acid wnl. -NPO except for ice chips/meds for now  - Out of bed  Hypokalemia -Aggressively replete   airspace disease suggestive of pneumonia CT abdomen/pelvis reads as new airspace disease in lateral segment of RLL suggests pneumonia; however, he has had no fever/chills, shortness of breath/coughing or other clinical signs/symptoms of pneumonia.  He has no SIRS criteria Denies any signs/symptoms of aspiration Given rocephin and azithromycin in ED Low suspicion of pneumonia and procalcitonin negative   History of intracranial hemorrhage History of severe encephalopathy History of CVA/TIA/progressive leukoencephalopathy and chronic left ICA stenosis -Patient has history of severe progressive encephalopathy and intracranial hemorrhage.  Previously was on aspirin , Eliquis and Plavix  everything has been discontinued after patient developed intracranial hemorrhage in 2024.     Antiphospholipid syndrome (HCC) Off all AC s/p ICH in 2024    Pressure injury of skin Right lateral malleolus with scabbed over pressure wound, about 2cm.  No evidence of infection. Healed over. Continue boot, turning    Essential hypertension No medication, monitor    Prolonged QT interval Optimize electrolytes Keep on telemetry Avoid qt prolonging drugs        Family Communication/Anticipated D/C date and plan/Code Status   DVT prophylaxis: Lovenox  ordered. Code Status: Full Code.  Family Communication:  Disposition Plan: Status is: Inpatient      Medical Consultants:   GI  Subjective:   Having bowel movements in bed   Objective:    Vitals:   04/13/24 1809 04/13/24  1913 04/13/24 2000 04/14/24 0440  BP: 113/81 113/81 101/78 106/79  Pulse: 85 85 88 94  Resp: 16 16 18    Temp: 97.8 F (36.6 C) 97.8 F (36.6 C) 98.5 F (36.9 C) 98.3 F (36.8 C)  TempSrc: Oral Oral Oral Oral  SpO2: 98%  97%  95%  Weight:  67.9 kg    Height:  5' 6 (1.676 m)      Intake/Output Summary (Last 24 hours) at 04/14/2024 1056 Last data filed at 04/14/2024 0803 Gross per 24 hour  Intake 1052.62 ml  Output 1150 ml  Net -97.38 ml   Filed Weights   04/13/24 1913  Weight: 67.9 kg    Exam:  General: Appearance:    Well developed, well nourished male in no acute distress     Lungs:      respirations unlabored  Heart:    Normal heart rate.   MS:   All extremities are intact.   Neurologic:   Awake, alert     Data Reviewed:   I have personally reviewed following labs and imaging studies:  Labs: Labs show the following:   Basic Metabolic Panel: Recent Labs  Lab 04/13/24 1253 04/13/24 1856 04/14/24 0544  NA 138  --  139  K 2.5*  --  2.8*  CL 96*  --  106  CO2 25  --  20*  GLUCOSE 95  --  83  BUN 17  --  10  CREATININE 1.33*  --  1.09  CALCIUM  8.7*  --  7.8*  MG  --  1.8  --    GFR Estimated Creatinine Clearance: 65.8 mL/min (by C-G formula based on SCr of 1.09 mg/dL). Liver Function Tests: Recent Labs  Lab 04/13/24 1253 04/14/24 0544  AST 17 14*  ALT 13 11  ALKPHOS 108 86  BILITOT 0.7 1.0  PROT 7.6 6.2*  ALBUMIN 3.0* 2.4*   No results for input(s): LIPASE, AMYLASE in the last 168 hours. No results for input(s): AMMONIA in the last 168 hours. Coagulation profile No results for input(s): INR, PROTIME in the last 168 hours.  CBC: Recent Labs  Lab 04/13/24 1253  WBC 9.3  HGB 13.4  HCT 41.3  MCV 84.1  PLT 329   Cardiac Enzymes: No results for input(s): CKTOTAL, CKMB, CKMBINDEX, TROPONINI in the last 168 hours. BNP (last 3 results) No results for input(s): PROBNP in the last 8760 hours. CBG: No results for input(s): GLUCAP in the last 168 hours. D-Dimer: No results for input(s): DDIMER in the last 72 hours. Hgb A1c: No results for input(s): HGBA1C in the last 72 hours. Lipid Profile: No results for input(s): CHOL, HDL, LDLCALC,  TRIG, CHOLHDL, LDLDIRECT in the last 72 hours. Thyroid  function studies: Recent Labs    04/13/24 1856  TSH 0.936   Anemia work up: No results for input(s): VITAMINB12, FOLATE, FERRITIN, TIBC, IRON, RETICCTPCT in the last 72 hours. Sepsis Labs: Recent Labs  Lab 04/13/24 1253 04/13/24 1856 04/13/24 2134 04/13/24 2337  PROCALCITON  --  <0.10  --   --   WBC 9.3  --   --   --   LATICACIDVEN  --   --  1.7 1.0    Microbiology No results found for this or any previous visit (from the past 240 hours).  Procedures and diagnostic studies:  CT ABDOMEN PELVIS W CONTRAST Result Date: 04/13/2024 CLINICAL DATA:  Acute abdominal pain, acute nonlocalized. History of colonic ileus EXAM: CT ABDOMEN AND PELVIS WITH CONTRAST TECHNIQUE:  Multidetector CT imaging of the abdomen and pelvis was performed using the standard protocol following bolus administration of intravenous contrast. RADIATION DOSE REDUCTION: This exam was performed according to the departmental dose-optimization program which includes automated exposure control, adjustment of the mA and/or kV according to patient size and/or use of iterative reconstruction technique. CONTRAST:  OMNIPAQUE  IOHEXOL  300 MG/ML  SOLN COMPARISON:  CT 03/06/2024 FINDINGS: Lower chest: Segmental consolidation lateral aspect of the RIGHT lower lobe (image 34/series 5 is new finding from prior. Hepatobiliary: No focal hepatic lesion. Normal gallbladder. No biliary duct dilatation. Common bile duct is normal. Pancreas: Pancreas is normal. No ductal dilatation. No pancreatic inflammation. Spleen: Normal spleen Adrenals/urinary tract: Adrenal glands normal. Nonobstructing calculi within the kidneys. Ureters and bladder normal. Stomach/Bowel: Stomach is decompressed. The small bowel is decompressed. No evidence small bowel obstruction. Terminal ileum is normal. Appendix normal. The ascending and transverse colon is gas-filled and distended. Transverse  colon measures 7.5 cm diameter. There is fluid stool in the descending colon and rectosigmoid colon. The LEFT colon is ahaustral. Findings are similar to comparison exam with mild increase in gaseous distension. No pneumatosis. No intraperitoneal free air Vascular/Lymphatic: Abdominal aorta is normal caliber. No periportal or retroperitoneal adenopathy. No pelvic adenopathy. Reproductive: Unremarkable Other: No free fluid. Musculoskeletal: No aggressive osseous lesion. IMPRESSION: 1. Persistent colonic ileus pattern. Gaseous distention of the colon slightly increased. Fluid stool in the a ahaustral left colon. 2. New airspace disease in lateral segment of the RIGHT lower lobe suggest pneumonia. Electronically Signed   By: Deboraha Fallow M.D.   On: 04/13/2024 15:34    Medications:    azithromycin  500 mg Oral Once   Continuous Infusions:  sodium chloride  75 mL/hr at 04/14/24 0310   potassium chloride  10 mEq (04/14/24 0955)     LOS: 1 day   Enrigue Harvard  [facility]   How to contact the [facility] Attending or Consulting provider 7A - 7P or covering provider during after hours 7P -7A, for this patient?  Check the care team in [facility] and look for a) attending/consulting [facility] provider listed and b) the [facility] team listed Log into [email or web address] and use [facility]'s universal password to access. If you do not have the password, please contact the hospital operator. Locate the [facility] provider you are looking for under [facility] and page to a number that you can be directly reached. If you still have difficulty reaching the provider, please page the [facility] (Director on Call) for the Hospitalists listed on amion for assistance.  04/14/2024, 10:56 AM

## 2024-04-15 DIAGNOSIS — K567 Ileus, unspecified: Secondary | ICD-10-CM | POA: Diagnosis not present

## 2024-04-15 LAB — CBC
HCT: 34 % — ABNORMAL LOW (ref 39.0–52.0)
Hemoglobin: 11 g/dL — ABNORMAL LOW (ref 13.0–17.0)
MCH: 26.7 pg (ref 26.0–34.0)
MCHC: 32.4 g/dL (ref 30.0–36.0)
MCV: 82.5 fL (ref 80.0–100.0)
Platelets: 267 10*3/uL (ref 150–400)
RBC: 4.12 MIL/uL — ABNORMAL LOW (ref 4.22–5.81)
RDW: 14.4 % (ref 11.5–15.5)
WBC: 7.4 10*3/uL (ref 4.0–10.5)
nRBC: 0 % (ref 0.0–0.2)

## 2024-04-15 LAB — BASIC METABOLIC PANEL WITH GFR
Anion gap: 10 (ref 5–15)
BUN: <5 mg/dL — ABNORMAL LOW (ref 6–20)
CO2: 19 mmol/L — ABNORMAL LOW (ref 22–32)
Calcium: 7.2 mg/dL — ABNORMAL LOW (ref 8.9–10.3)
Chloride: 111 mmol/L (ref 98–111)
Creatinine, Ser: 1.01 mg/dL (ref 0.61–1.24)
GFR, Estimated: >60 mL/min (ref >60)
Glucose, Bld: 96 mg/dL (ref 70–99)
Potassium: 3.1 mmol/L — ABNORMAL LOW (ref 3.5–5.1)
Sodium: 140 mmol/L (ref 135–145)

## 2024-04-15 MED ORDER — POTASSIUM CHLORIDE 20 MEQ PO PACK
40.0000 meq | PACK | Freq: Once | ORAL | Status: AC
  Administered 2024-04-15: 40 meq via ORAL
  Filled 2024-04-15: qty 2

## 2024-04-15 MED ORDER — POTASSIUM CHLORIDE 10 MEQ/100ML IV SOLN
10.0000 meq | INTRAVENOUS | Status: AC
  Administered 2024-04-15 (×6): 10 meq via INTRAVENOUS
  Filled 2024-04-15 (×3): qty 100

## 2024-04-15 MED ORDER — RISAQUAD PO CAPS
2.0000 | ORAL_CAPSULE | Freq: Every day | ORAL | Status: DC
  Administered 2024-04-15 – 2024-04-18 (×4): 2 via ORAL
  Filled 2024-04-15 (×4): qty 2

## 2024-04-15 MED ORDER — POTASSIUM CHLORIDE CRYS ER 20 MEQ PO TBCR
40.0000 meq | EXTENDED_RELEASE_TABLET | Freq: Once | ORAL | Status: DC
  Filled 2024-04-15: qty 2

## 2024-04-15 NOTE — Progress Notes (Signed)
 [facility] Subjective: Since I last evaluated the patient, he has done remarkably well. He has had 2 bowel movements a day and continues to have regular bowel movements with passage of flatus.  He denies having abdominal pain nausea or vomiting.  Objective: Vital signs in last 24 hours: Temp:  [98.1 F (36.7 C)-98.6 F (37 C)] 98.5 F (36.9 C) (06/15 0420) Pulse Rate:  [78-89] 78 (06/15 0420) Resp:  [18-20] 18 (06/15 0420) BP: (95-128)/(64-92) 95/64 (06/15 0420) SpO2:  [93 %-98 %] 93 % (06/15 0420) Last BM Date : 04/14/24  Intake/Output from previous day: 06/14 0701 - 06/15 0700 In: 1059.6 [P.O.:660; IV Piggyback:399.6] Out: 2600 [Urine:2600] Intake/Output this shift: No intake/output data recorded.  General appearance: fatigued and no distress Resp: clear to auscultation bilaterally Cardio: regular rate and rhythm, S1, S2 normal, no murmur, click, rub or gallop GI: soft, distented but soft non-tender; bowel sounds normal; no masses,  no organomegaly  Lab Results: Recent Labs    04/13/24 1253 04/14/24 0544 04/15/24 0335  WBC 9.3 7.8 7.4  HGB 13.4 12.0* 11.0*  HCT 41.3 41.1 34.0*  PLT 329 284 267   BMET Recent Labs    04/14/24 0544 04/14/24 1719 04/15/24 0335  NA 139 142 140  K 2.8* 3.1* 3.1*  CL 106 110 111  CO2 20* 19* 19*  GLUCOSE 83 96 96  BUN 10 7 <5*  CREATININE 1.09 0.99 1.01  CALCIUM  7.8* 7.5* 7.2*   LFT Recent Labs    04/14/24 0544  PROT 6.2*  ALBUMIN 2.4*  AST 14*  ALT 11  ALKPHOS 86  BILITOT 1.0   No results for input(s): FECLLACTOFRN in the last 72 hours.  Studies/Results: DG Abd Portable 1V Result Date: 04/14/2024 CLINICAL DATA:  [id number] [facility]) [id number] EXAM: PORTABLE ABDOMEN - 1 VIEW COMPARISON:  April 13, 2024 FINDINGS: Persistent gaseous dilation of the colon. Extent of dilation is favored similar to minimally improved since prior CT. Evaluation for free air is limited due to supine positioning. Bilateral nonobstructing  nephrolithiasis. IMPRESSION: Persistent gaseous dilation of the colon consistent with reported colonic ileus. Extent of dilation is favored similar to minimally improved since prior CT. Electronically Signed   By: Clancy Crimes M.D.   On: 04/14/2024 13:32   CT ABDOMEN PELVIS W CONTRAST Result Date: 04/13/2024 CLINICAL DATA:  Acute abdominal pain, acute nonlocalized. History of colonic ileus EXAM: CT ABDOMEN AND PELVIS WITH CONTRAST TECHNIQUE: Multidetector CT imaging of the abdomen and pelvis was performed using the standard protocol following bolus administration of intravenous contrast. RADIATION DOSE REDUCTION: This exam was performed according to the departmental dose-optimization program which includes automated exposure control, adjustment of the mA and/or kV according to patient size and/or use of iterative reconstruction technique. CONTRAST:  OMNIPAQUE  IOHEXOL  300 MG/ML  SOLN COMPARISON:  CT 03/06/2024 FINDINGS: Lower chest: Segmental consolidation lateral aspect of the RIGHT lower lobe (image 34/series 5 is new finding from prior. Hepatobiliary: No focal hepatic lesion. Normal gallbladder. No biliary duct dilatation. Common bile duct is normal. Pancreas: Pancreas is normal. No ductal dilatation. No pancreatic inflammation. Spleen: Normal spleen Adrenals/urinary tract: Adrenal glands normal. Nonobstructing calculi within the kidneys. Ureters and bladder normal. Stomach/Bowel: Stomach is decompressed. The small bowel is decompressed. No evidence small bowel obstruction. Terminal ileum is normal. Appendix normal. The ascending and transverse colon is gas-filled and distended. Transverse colon measures 7.5 cm diameter. There is fluid stool in the descending colon and rectosigmoid colon. The LEFT colon is ahaustral.  Findings are similar to comparison exam with mild increase in gaseous distension. No pneumatosis. No intraperitoneal free air Vascular/Lymphatic: Abdominal aorta is normal caliber. No  periportal or retroperitoneal adenopathy. No pelvic adenopathy. Reproductive: Unremarkable Other: No free fluid. Musculoskeletal: No aggressive osseous lesion. IMPRESSION: 1. Persistent colonic ileus pattern. Gaseous distention of the colon slightly increased. Fluid stool in the a ahaustral left colon. 2. New airspace disease in lateral segment of the RIGHT lower lobe suggest pneumonia. Electronically Signed   By: Deboraha Fallow M.D.   On: 04/13/2024 15:34    Medications: I have reviewed the patient's current medications. Prior to Admission:  Medications Prior to Admission  Medication Sig Dispense Refill Last Dose/Taking   acetaminophen  (TYLENOL ) 325 MG tablet Take 2 tablets (650 mg total) by mouth every 6 (six) hours as needed for mild pain, moderate pain, fever or headache (or temp > 37.5 C (99.5 F)).   Unknown   cholecalciferol  (CHOLECALCIFEROL ) 25 MCG tablet Take 1 tablet (1,000 Units total) by mouth daily.   04/13/2024   cyanocobalamin  1000 MCG tablet Take 1 tablet (1,000 mcg total) by mouth daily.   04/13/2024   feeding supplement (ENSURE ENLIVE / ENSURE PLUS) LIQD Take 237 mLs by mouth 2 (two) times daily between meals. (Patient taking differently: Take 237 mLs by mouth 3 (three) times daily between meals. Chocolate if available) 237 mL 12 04/13/2024   gabapentin  (NEURONTIN ) 300 MG capsule TAKE 1 CAPSULE BY MOUTH BY MOUTH TWO TIMES DAILY (Patient taking differently: Take 300 mg by mouth 2 (two) times daily.) 180 capsule 3 04/13/2024   ipratropium-albuterol (DUONEB) 0.5-2.5 (3) MG/3ML SOLN Take 3 mLs by nebulization every 6 (six) hours as needed (SOB, wheezing).   Unknown   metoCLOPramide  (REGLAN ) 5 MG tablet Take 1 tablet (5 mg total) by mouth 3 (three) times daily before meals for 7 days. (Patient taking differently: Take 5 mg by mouth every 6 (six) hours.) 21 tablet 0 04/13/2024   nutrition supplement, JUVEN, (JUVEN) PACK Take 1 packet by mouth 2 (two) times daily between meals.   04/13/2024    pantoprazole  (PROTONIX ) 40 MG tablet Take 1 tablet (40 mg total) by mouth at bedtime.   Past Week   polyethylene glycol (MIRALAX  / GLYCOLAX ) 17 g packet Take 17 g by mouth 2 (two) times daily.   04/13/2024   simethicone  (MYLICON) 80 MG chewable tablet Chew 1 tablet (80 mg total) by mouth 4 (four) times daily -  before meals and at bedtime. (Patient taking differently: Chew 80 mg by mouth 4 (four) times daily.) 30 tablet 0 04/13/2024   sodium phosphate (FLEET) ENEM Place 1 enema rectally every other day.   04/13/2024   [EXPIRED] doxycycline  (ADOXA) 100 MG tablet Take 100 mg by mouth 2 (two) times daily. (Patient not taking: Reported on 04/14/2024)   Not Taking   Sorbitol  (SMOG) 960 mL SOLN enema Place 960 mLs rectally every other day. (Patient not taking: Reported on 04/14/2024)   Not Taking   Scheduled:  acidophilus  2 capsule Oral Daily   azithromycin  500 mg Oral Once   Continuous:  potassium chloride  10 mEq (04/15/24 1000)    Assessment/Plan: 1) Chronic ileus secondary to immobility with worsening distention on recent scan-seems to have improved clinically today with more BMs today.  Monitor electrolytes and replete as needed. Would help to change the patient's position every 2-3 hours in bed.  Liberal fluid intake would also be helpful 2) hypokalemia-needs repletion aggressively.Aaron Aas 3) airspace disease noted on CT  of the abdomen pelvis the question right lower lobe pneumonia which is not clinically apparent at this time. 4) History of intracranial hemorrhage with severe encephalopathy/CVA/chronic left ICA stenosis. 5) Antiphospholipid antibody syndrome. 6) HTN. Will sign off now please call if further assistance needed  LOS: 2 days   Tami Falcon 04/15/2024, 10:23 AM

## 2024-04-15 NOTE — Progress Notes (Signed)
 Progress Note    Daniel Stuart  JXB:[id number] DOB: 04-04-1966  DOA: 04/13/2024 PCP: Paseda, Folashade R, [name]    Brief Narrative:      Daniel Stuart is an 58 y.o. male  with medical history significant of ICH with residual right sided weakness and bedbound, progressive leukoencephalopathy, PAD/left ICA stenosis, cortical blindness,  PPM, hx of CVA, HTN, HLD, antiphospholipid syndrome and persistent colonic ileus who presented to ED with concerns for abdominal distention.    Sister gives history. When he was discharged from the hospital he has only been on a liquid diet. Discharge summary reports advance diet as tolerated, but the SNF has not done this. He had a GI follow up on 04/02/24, but missed this appointment due to facility taking him to wrong facility. Yesterday they noticed that his stomach was more distended and a KUB was taken. He was then sent to ED. He has had no abdominal pain, no N/V/D. Unsure about normal BM as he has been on liquid diet only. He is unsure about flatus.  He has had no complaints. No fever/chills, no shortness of breath or coughing.    Admitted in may 2025 for pseudo obstruction of colon and managed conservatively. Followed by [facility] for persistent colonic ileus.     Assessment/Plan:   Principal Problem:   Ileus (HCC) Active Problems:   Hypokalemia   airspace disease   History of intracranial hemorrhage History of severe encephalopathy History of CVA/TIA/progressive leukoencephalopathy and chronic left ICA stenosis   Antiphospholipid syndrome (HCC)   Pressure injury of skin   Essential hypertension   Prolonged QT interval    Ileus (HCC) 58 year old bedbound male with history of persistent ileus presented to ED after being examined and found to have increased abdominal distention  -he has no abdominal pain, N/V/D or complaints  -CT abdomen/pelvis with persistent colonic ileus pattern. Slightly increased from previous imaging.  The patient  has since improved with multiple bowel movements - Aggressively replete potassium and magnesium  -GI consulted, f/u on recommendations-they have signed off -lactic acid wnl. - Advance diet as tolerated - Moving bed  Hypokalemia -Aggressively replete   airspace disease suggestive of pneumonia CT abdomen/pelvis reads as new airspace disease in lateral segment of RLL suggests pneumonia; however, he has had no fever/chills, shortness of breath/coughing or other clinical signs/symptoms of pneumonia.  He has no SIRS criteria Denies any signs/symptoms of aspiration Given rocephin and azithromycin in ED Low suspicion of pneumonia and procalcitonin negative   History of intracranial hemorrhage History of severe encephalopathy History of CVA/TIA/progressive leukoencephalopathy and chronic left ICA stenosis -Patient has history of severe progressive encephalopathy and intracranial hemorrhage.  Previously was on aspirin , Eliquis and Plavix  everything has been discontinued after patient developed intracranial hemorrhage in 2024.     Antiphospholipid syndrome (HCC) Off all AC s/p ICH in 2024    Pressure injury of skin Right lateral malleolus with scabbed over pressure wound, about 2cm.  No evidence of infection. Healed over. Continue boot, turning    Essential hypertension No medication, monitor    Prolonged QT interval Optimize electrolytes Keep on telemetry Avoid qt prolonging drugs     Back to facility in 24 to 48 hours    Family Communication/Anticipated D/C date and plan/Code Status   DVT prophylaxis: Lovenox  ordered. Code Status: Full Code.  Family Communication:  Disposition Plan: Status is: Inpatient      Medical Consultants:   GI  Subjective:   Tolerating diet  Objective:    Vitals:   04/14/24 0440 04/14/24 1227 04/14/24 1934 04/15/24 0420  BP: 106/79 (!) 128/92 107/74 95/64  Pulse: 94 89 86 78  Resp:  20 18 18   Temp: 98.3 F (36.8 C) 98.1 F (36.7 C)  98.6 F (37 C) 98.5 F (36.9 C)  TempSrc: Oral Oral Oral Oral  SpO2: 95% 98% 95% 93%  Weight:      Height:        Intake/Output Summary (Last 24 hours) at 04/15/2024 1104 Last data filed at 04/15/2024 0500 Gross per 24 hour  Intake 1059.6 ml  Output 2200 ml  Net -1140.4 ml   Filed Weights   04/13/24 1913  Weight: 67.9 kg    Exam:  General: Appearance:    Well developed, well nourished male in no acute distress     Lungs:      respirations unlabored  Heart:    Normal heart rate.   MS:   All extremities are intact.   Neurologic:   Awake, alert     Data Reviewed:   I have personally reviewed following labs and imaging studies:  Labs: Labs show the following:   Basic Metabolic Panel: Recent Labs  Lab 04/13/24 1253 04/13/24 1856 04/14/24 0544 04/14/24 1719 04/15/24 0335  NA 138  --  139 142 140  K 2.5*  --  2.8* 3.1* 3.1*  CL 96*  --  106 110 111  CO2 25  --  20* 19* 19*  GLUCOSE 95  --  83 96 96  BUN 17  --  10 7 <5*  CREATININE 1.33*  --  1.09 0.99 1.01  CALCIUM  8.7*  --  7.8* 7.5* 7.2*  MG  --  1.8  --   --   --    GFR Estimated Creatinine Clearance: 71.1 mL/min (by C-G formula based on SCr of 1.01 mg/dL). Liver Function Tests: Recent Labs  Lab 04/13/24 1253 04/14/24 0544  AST 17 14*  ALT 13 11  ALKPHOS 108 86  BILITOT 0.7 1.0  PROT 7.6 6.2*  ALBUMIN 3.0* 2.4*   No results for input(s): LIPASE, AMYLASE in the last 168 hours. No results for input(s): AMMONIA in the last 168 hours. Coagulation profile No results for input(s): INR, PROTIME in the last 168 hours.  CBC: Recent Labs  Lab 04/13/24 1253 04/14/24 0544 04/15/24 0335  WBC 9.3 7.8 7.4  HGB 13.4 12.0* 11.0*  HCT 41.3 41.1 34.0*  MCV 84.1 91.7 82.5  PLT 329 284 267   Cardiac Enzymes: No results for input(s): CKTOTAL, CKMB, CKMBINDEX, TROPONINI in the last 168 hours. BNP (last 3 results) No results for input(s): PROBNP in the last 8760 hours. CBG: No results  for input(s): GLUCAP in the last 168 hours. D-Dimer: No results for input(s): DDIMER in the last 72 hours. Hgb A1c: No results for input(s): HGBA1C in the last 72 hours. Lipid Profile: No results for input(s): CHOL, HDL, LDLCALC, TRIG, CHOLHDL, LDLDIRECT in the last 72 hours. Thyroid  function studies: Recent Labs    04/13/24 1856  TSH 0.936   Anemia work up: No results for input(s): VITAMINB12, FOLATE, FERRITIN, TIBC, IRON, RETICCTPCT in the last 72 hours. Sepsis Labs: Recent Labs  Lab 04/13/24 1253 04/13/24 1856 04/13/24 2134 04/13/24 2337 04/14/24 0544 04/15/24 0335  PROCALCITON  --  <0.10  --   --  <0.10  --   WBC 9.3  --   --   --  7.8 7.4  LATICACIDVEN  --   --  1.7 1.0  --   --     Microbiology No results found for this or any previous visit (from the past 240 hours).  Procedures and diagnostic studies:  DG Abd Portable 1V Result Date: 04/14/2024 CLINICAL DATA:  [id number] Ileus (HCC) [id number] EXAM: PORTABLE ABDOMEN - 1 VIEW COMPARISON:  April 13, 2024 FINDINGS: Persistent gaseous dilation of the colon. Extent of dilation is favored similar to minimally improved since prior CT. Evaluation for free air is limited due to supine positioning. Bilateral nonobstructing nephrolithiasis. IMPRESSION: Persistent gaseous dilation of the colon consistent with reported colonic ileus. Extent of dilation is favored similar to minimally improved since prior CT. Electronically Signed   By: Clancy Crimes M.D.   On: 04/14/2024 13:32   CT ABDOMEN PELVIS W CONTRAST Result Date: 04/13/2024 CLINICAL DATA:  Acute abdominal pain, acute nonlocalized. History of colonic ileus EXAM: CT ABDOMEN AND PELVIS WITH CONTRAST TECHNIQUE: Multidetector CT imaging of the abdomen and pelvis was performed using the standard protocol following bolus administration of intravenous contrast. RADIATION DOSE REDUCTION: This exam was performed according to the departmental dose-optimization  program which includes automated exposure control, adjustment of the mA and/or kV according to patient size and/or use of iterative reconstruction technique. CONTRAST:  OMNIPAQUE  IOHEXOL  300 MG/ML  SOLN COMPARISON:  CT 03/06/2024 FINDINGS: Lower chest: Segmental consolidation lateral aspect of the RIGHT lower lobe (image 34/series 5 is new finding from prior. Hepatobiliary: No focal hepatic lesion. Normal gallbladder. No biliary duct dilatation. Common bile duct is normal. Pancreas: Pancreas is normal. No ductal dilatation. No pancreatic inflammation. Spleen: Normal spleen Adrenals/urinary tract: Adrenal glands normal. Nonobstructing calculi within the kidneys. Ureters and bladder normal. Stomach/Bowel: Stomach is decompressed. The small bowel is decompressed. No evidence small bowel obstruction. Terminal ileum is normal. Appendix normal. The ascending and transverse colon is gas-filled and distended. Transverse colon measures 7.5 cm diameter. There is fluid stool in the descending colon and rectosigmoid colon. The LEFT colon is ahaustral. Findings are similar to comparison exam with mild increase in gaseous distension. No pneumatosis. No intraperitoneal free air Vascular/Lymphatic: Abdominal aorta is normal caliber. No periportal or retroperitoneal adenopathy. No pelvic adenopathy. Reproductive: Unremarkable Other: No free fluid. Musculoskeletal: No aggressive osseous lesion. IMPRESSION: 1. Persistent colonic ileus pattern. Gaseous distention of the colon slightly increased. Fluid stool in the a ahaustral left colon. 2. New airspace disease in lateral segment of the RIGHT lower lobe suggest pneumonia. Electronically Signed   By: Deboraha Fallow M.D.   On: 04/13/2024 15:34    Medications:    acidophilus  2 capsule Oral Daily   azithromycin  500 mg Oral Once   Continuous Infusions:  potassium chloride  10 mEq (04/15/24 1000)     LOS: 2 days   Enrigue Harvard  [facility]   How to contact  the [facility] Attending or Consulting provider 7A - 7P or covering provider during after hours 7P -7A, for this patient?  Check the care team in [facility] and look for a) attending/consulting TRH provider listed and b) the TRH team listed Log into [email or web address] and use [place]'s universal password to access. If you do not have the password, please contact the hospital operator. Locate the TRH provider you are looking for under [facility] and page to a number that you can be directly reached. If you still have difficulty reaching the provider, please page the [facility] (Director on Call) for the Hospitalists listed on amion for assistance.  04/15/2024, 11:04 AM

## 2024-04-16 DIAGNOSIS — K567 Ileus, unspecified: Secondary | ICD-10-CM | POA: Diagnosis not present

## 2024-04-16 LAB — BASIC METABOLIC PANEL WITH GFR
Anion gap: 9 (ref 5–15)
BUN: <5 mg/dL — ABNORMAL LOW (ref 6–20)
CO2: 17 mmol/L — ABNORMAL LOW (ref 22–32)
Calcium: 7.2 mg/dL — ABNORMAL LOW (ref 8.9–10.3)
Chloride: 111 mmol/L (ref 98–111)
Creatinine, Ser: 1.03 mg/dL (ref 0.61–1.24)
GFR, Estimated: >60 mL/min (ref >60)
Glucose, Bld: 93 mg/dL (ref 70–99)
Potassium: 3.3 mmol/L — ABNORMAL LOW (ref 3.5–5.1)
Sodium: 137 mmol/L (ref 135–145)

## 2024-04-16 LAB — MAGNESIUM: Magnesium: 2 mg/dL (ref 1.7–2.4)

## 2024-04-16 MED ORDER — POTASSIUM CHLORIDE 20 MEQ PO PACK
40.0000 meq | PACK | Freq: Two times a day (BID) | ORAL | Status: DC
  Administered 2024-04-16 – 2024-04-18 (×5): 40 meq via ORAL
  Filled 2024-04-16 (×5): qty 2

## 2024-04-16 NOTE — TOC Initial Note (Signed)
 Transition of Care [facility]) - Initial/Assessment Note    Patient Details  Name: Daniel Stuart MRN: [id number] Date of Birth: 1966/09/24  [facility]) CM/SW Contact:    Marty Sleet, LCSW Phone Number: 04/16/2024, 10:38 AM  Clinical Narrative:                 Pt from [facility]. Spoke with pt's sister, [name] via t/c and confirmed plan for pt to return to [facility] at discharge. Pt's sister shares that pt is mainly bedbound however, will also be up and about in a wheelchair at times. Confirmed w/ Tanya at [facility] that pt is able to return when medically stable.    Barriers to Discharge: No Barriers Identified   Patient Goals and CMS Choice Patient states their goals for this hospitalization and ongoing recovery are:: For pt to return to [facility].gov Compare Post Acute Care list provided to:: Patient Represenative (must comment) Choice offered to / list presented to : Sibling Crawford ownership interest in [facility].provided to:: Sibling    Expected Discharge Plan and Services In-house Referral: Clinical Social Work Discharge Planning Services: NA Post Acute Care Choice: Skilled Nursing Facility, Resumption of Svcs/PTA Provider Living arrangements for the past 2 months: Skilled Nursing Facility                 DME Arranged: N/A DME Agency: NA                  Prior Living Arrangements/Services Living arrangements for the past 2 months: Skilled Nursing Facility Lives with:: Facility Resident Patient language and need for interpreter reviewed:: Yes Do you feel safe going back to the place where you live?: Yes      Need for Family Participation in Patient Care: Yes (Comment) Care giver support system in place?: Yes (comment) Current home services: DME (wheelchair) Criminal Activity/Legal Involvement Pertinent to Current Situation/Hospitalization: No - Comment as needed  Activities of Daily Living   ADL Screening  (condition at time of admission) Independently performs ADLs?: No Does the patient have a NEW difficulty with bathing/dressing/toileting/self-feeding that is expected to last >3 days?: No Does the patient have a NEW difficulty with getting in/out of bed, walking, or climbing stairs that is expected to last >3 days?: No Does the patient have a NEW difficulty with communication that is expected to last >3 days?: No Is the patient deaf or have difficulty hearing?: No Does the patient have difficulty seeing, even when wearing glasses/contacts?: No Does the patient have difficulty concentrating, remembering, or making decisions?: Yes  Permission Sought/Granted Permission sought to share information with : Facility Medical sales representative, Family Supports Permission granted to share information with : Yes, Verbal Permission Granted  Share Information with NAME: Tory Freiberg (Sister)  [phone]  Permission granted to share info w AGENCY: [facility]        Emotional Assessment Appearance:: Appears older than stated age Attitude/Demeanor/Rapport: Unable to Assess Affect (typically observed): Unable to Assess Orientation: : Oriented to Self, Oriented to Place, Oriented to  Time, Oriented to Situation Alcohol / Substance Use: Not Applicable Psych Involvement: No (comment)  Admission diagnosis:  Hypokalemia [E87.6] Ileus (HCC) [K56.7] Patient Active Problem List   Diagnosis Date Noted   airspace disease 04/13/2024   Prolonged QT interval 04/13/2024   Ileus (HCC) 03/05/2024   Edema of right upper arm 02/28/2024   Protein calorie malnutrition (HCC) 02/27/2024   Pressure injury of skin 02/24/2024   Colonic pseudoobstruction 02/23/2024  Colitis 02/23/2024   Hypokalemia 02/23/2024   History of intracranial hemorrhage 02/23/2024   Essential hypertension 02/23/2024   Antiphospholipid syndrome (HCC) 02/23/2024   Left carotid artery occlusion 09/09/2022   Weakness of both lower extremities  09/07/2022   Bedbound 08/25/2022   PAD (peripheral artery disease) (HCC) 08/25/2022   Arrhythmia as indication for cardiac pacemaker replacement 03/11/2020   Vitamin D  deficiency 09/13/2019   History of intracranial hemorrhage History of severe encephalopathy History of CVA/TIA/progressive leukoencephalopathy and chronic left ICA stenosis 03/12/2019   Right sided weakness 03/12/2019   Anxiety 03/12/2019   PVD (peripheral vascular disease) (HCC) 04/12/2017   Carotid arterial disease (HCC) 04/12/2017   PCP:  Paseda, Folashade R, [name] Pharmacy:   [facility] - [place], [place] - [address] [place] [address] Phone: [phone] Fax: [phone]     Social Drivers of Health (SDOH) Social History: SDOH Screenings   Food Insecurity: No Food Insecurity (04/13/2024)  Housing: Low Risk  (04/13/2024)  Transportation Needs: No Transportation Needs (04/13/2024)  Utilities: Patient Declined (04/13/2024)  Alcohol Screen: Low Risk  (07/31/2021)  Depression (PHQ2-9): Low Risk  (04/13/2022)  Financial Resource Strain: Medium Risk (07/31/2021)  Physical Activity: Inactive (07/31/2021)  Social Connections: Socially Isolated (07/31/2021)  Stress: No Stress Concern Present (07/31/2021)  Tobacco Use: High Risk (04/13/2024)   SDOH Interventions: Social Connections Interventions: Intervention Not Indicated, Inpatient TOC   Readmission Risk Interventions    04/16/2024   10:19 AM  Readmission Risk Prevention Plan  Transportation Screening Complete  PCP or Specialist Appt within 5-7 Days Complete  Home Care Screening Complete  Medication Review (RN CM) Complete

## 2024-04-16 NOTE — Plan of Care (Signed)

## 2024-04-16 NOTE — Progress Notes (Signed)
 Progress Note    Daniel Stuart  XBJ:[id number] DOB: April 09, 1966  DOA: 04/13/2024 PCP: Paseda, Folashade R, [name]    Brief Narrative:      Daniel Stuart is an 58 y.o. male  with medical history significant of ICH with residual right sided weakness and bedbound, progressive leukoencephalopathy, PAD/left ICA stenosis, cortical blindness,  PPM, hx of CVA, HTN, HLD, antiphospholipid syndrome and persistent colonic ileus who presented to ED with concerns for abdominal distention.    Sister gives history. When he was discharged from the hospital he has only been on a liquid diet. Discharge summary reports advance diet as tolerated, but the SNF has not done this. He had a GI follow up on 04/02/24, but missed this appointment due to facility taking him to wrong facility. Yesterday they noticed that his stomach was more distended and a KUB was taken. He was then sent to ED. He has had no abdominal pain, no N/V/D. Unsure about normal BM as he has been on liquid diet only. He is unsure about flatus.  He has had no complaints. No fever/chills, no shortness of breath or coughing.    Admitted in may 2025 for pseudo obstruction of colon and managed conservatively. Followed by [facility] for persistent colonic ileus.     Assessment/Plan:   Principal Problem:   Ileus (HCC) Active Problems:   Hypokalemia   airspace disease   History of intracranial hemorrhage History of severe encephalopathy History of CVA/TIA/progressive leukoencephalopathy and chronic left ICA stenosis   Antiphospholipid syndrome (HCC)   Pressure injury of skin   Essential hypertension   Prolonged QT interval    Ileus (HCC) 58 year old bedbound male with history of persistent ileus presented to ED after being examined and found to have increased abdominal distention  -he has no abdominal pain, N/V/D or complaints  -CT abdomen/pelvis with persistent colonic ileus pattern. Slightly increased from previous imaging.  The patient  has since improved with multiple bowel movements - Aggressively replete potassium and magnesium  -GI consulted, f/u on recommendations-they have signed off -lactic acid wnl. - Advance diet as tolerated - Moving bed  Hypokalemia -Aggressively replete - For discharge needs to be stable greater than 3.5   airspace disease suggestive of pneumonia CT abdomen/pelvis reads as new airspace disease in lateral segment of RLL suggests pneumonia; however, he has had no fever/chills, shortness of breath/coughing or other clinical signs/symptoms of pneumonia.  He has no SIRS criteria Denies any signs/symptoms of aspiration Given rocephin and azithromycin in ED Low suspicion of pneumonia and procalcitonin negative   History of intracranial hemorrhage History of severe encephalopathy History of CVA/TIA/progressive leukoencephalopathy and chronic left ICA stenosis -Patient has history of severe progressive encephalopathy and intracranial hemorrhage.  Previously was on aspirin , Eliquis and Plavix  everything has been discontinued after patient developed intracranial hemorrhage in 2024.     Antiphospholipid syndrome (HCC) Off all AC s/p ICH in 2024    Pressure injury of skin Right lateral malleolus with scabbed over pressure wound, about 2cm.  No evidence of infection. Healed over. Continue boot, turning    Essential hypertension No medication, monitor    Prolonged QT interval Optimize electrolytes Keep on telemetry Avoid qt prolonging drugs     Back to facility in 24 to 48 hours    Family Communication/Anticipated D/C date and plan/Code Status   DVT prophylaxis: Lovenox  ordered. Code Status: Full Code.  Family Communication:  Disposition Plan: Status is: Inpatient      Medical  Consultants:   GI  Subjective:   Tolerating diet, potassium improved but still low   Objective:    Vitals:   04/15/24 0420 04/15/24 1338 04/15/24 1950 04/16/24 0533  BP: 95/64 96/74 114/80 101/71   Pulse: 78 87 85 77  Resp: 18 20 16 15   Temp: 98.5 F (36.9 C) 98.2 F (36.8 C) 98.1 F (36.7 C) 98.1 F (36.7 C)  TempSrc: Oral Oral Oral Oral  SpO2: 93% 99% 94% 97%  Weight:      Height:        Intake/Output Summary (Last 24 hours) at 04/16/2024 1138 Last data filed at 04/16/2024 0133 Gross per 24 hour  Intake 840 ml  Output 1500 ml  Net -660 ml   Filed Weights   04/13/24 1913  Weight: 67.9 kg    Exam:  General: Appearance:    Well developed, well nourished male in no acute distress     Lungs:      respirations unlabored  Heart:    Normal heart rate.   MS:   All extremities are intact.   Neurologic:   Awake, alert     Data Reviewed:   I have personally reviewed following labs and imaging studies:  Labs: Labs show the following:   Basic Metabolic Panel: Recent Labs  Lab 04/13/24 1253 04/13/24 1856 04/14/24 0544 04/14/24 1719 04/15/24 0335 04/16/24 0213  NA 138  --  139 142 140 137  K 2.5*  --  2.8* 3.1* 3.1* 3.3*  CL 96*  --  106 110 111 111  CO2 25  --  20* 19* 19* 17*  GLUCOSE 95  --  83 96 96 93  BUN 17  --  10 7 <5* <5*  CREATININE 1.33*  --  1.09 0.99 1.01 1.03  CALCIUM  8.7*  --  7.8* 7.5* 7.2* 7.2*  MG  --  1.8  --   --   --  2.0   GFR Estimated Creatinine Clearance: 68.8 mL/min (by C-G formula based on SCr of 1.03 mg/dL). Liver Function Tests: Recent Labs  Lab 04/13/24 1253 04/14/24 0544  AST 17 14*  ALT 13 11  ALKPHOS 108 86  BILITOT 0.7 1.0  PROT 7.6 6.2*  ALBUMIN 3.0* 2.4*   No results for input(s): LIPASE, AMYLASE in the last 168 hours. No results for input(s): AMMONIA in the last 168 hours. Coagulation profile No results for input(s): INR, PROTIME in the last 168 hours.  CBC: Recent Labs  Lab 04/13/24 1253 04/14/24 0544 04/15/24 0335  WBC 9.3 7.8 7.4  HGB 13.4 12.0* 11.0*  HCT 41.3 41.1 34.0*  MCV 84.1 91.7 82.5  PLT 329 284 267   Cardiac Enzymes: No results for input(s): CKTOTAL, CKMB, CKMBINDEX,  TROPONINI in the last 168 hours. BNP (last 3 results) No results for input(s): PROBNP in the last 8760 hours. CBG: No results for input(s): GLUCAP in the last 168 hours. D-Dimer: No results for input(s): DDIMER in the last 72 hours. Hgb A1c: No results for input(s): HGBA1C in the last 72 hours. Lipid Profile: No results for input(s): CHOL, HDL, LDLCALC, TRIG, CHOLHDL, LDLDIRECT in the last 72 hours. Thyroid  function studies: Recent Labs    04/13/24 1856  TSH 0.936   Anemia work up: No results for input(s): VITAMINB12, FOLATE, FERRITIN, TIBC, IRON, RETICCTPCT in the last 72 hours. Sepsis Labs: Recent Labs  Lab 04/13/24 1253 04/13/24 1856 04/13/24 2134 04/13/24 2337 04/14/24 0544 04/15/24 0335  PROCALCITON  --  <0.10  --   --  <  0.10  --   WBC 9.3  --   --   --  7.8 7.4  LATICACIDVEN  --   --  1.7 1.0  --   --     Microbiology No results found for this or any previous visit (from the past 240 hours).  Procedures and diagnostic studies:  No results found.   Medications:    acidophilus  2 capsule Oral Daily   azithromycin  500 mg Oral Once   potassium chloride   40 mEq Oral BID   Continuous Infusions:     LOS: 3 days   Enrigue Harvard  [facility]   How to contact the [facility]H Attending or Consulting provider 7A - 7P or covering provider during after hours 7P -7A, for this patient?  Check the care team in [facility] and look for a) attending/consulting TRH provider listed and b) the TRH team listed Log into [email or web address] and use [place]'s universal password to access. If you do not have the password, please contact the hospital operator. Locate the TRH provider you are looking for under [facility] and page to a number that you can be directly reached. If you still have difficulty reaching the provider, please page the [facility] (Director on Call) for the Hospitalists listed on amion for assistance.  04/16/2024, 11:38 AM

## 2024-04-16 NOTE — [place] FL2 (Signed)
 [place]  MEDICAID FL2 LEVEL OF CARE FORM     IDENTIFICATION  Patient Name: Daniel Stuart Birthdate: [birth year] Sex: male Admission Date (Current Location): 04/13/2024  [facility] and [place] Number:  Producer, television/film/video and Address:  [facility],  [address] [place], [place] [address]      Provider Number: [id number]  Attending Physician Name and Address:  Enrigue Harvard, DO  Relative Name and Phone Number:  Tory Freiberg (Sister)  [phone]    Current Level of Care: Hospital Recommended Level of Care: Nursing Facility Prior Approval Number:    Date Approved/Denied:   PASRR Number: [id number]  Discharge Plan: SNF    Current Diagnoses: Patient Active Problem List   Diagnosis Date Noted   airspace disease 04/13/2024   Prolonged QT interval 04/13/2024   Ileus (HCC) 03/05/2024   Edema of right upper arm 02/28/2024   Protein calorie malnutrition (HCC) 02/27/2024   Pressure injury of skin 02/24/2024   Colonic pseudoobstruction 02/23/2024   Colitis 02/23/2024   Hypokalemia 02/23/2024   History of intracranial hemorrhage 02/23/2024   Essential hypertension 02/23/2024   Antiphospholipid syndrome (HCC) 02/23/2024   Left carotid artery occlusion 09/09/2022   Weakness of both lower extremities 09/07/2022   Bedbound 08/25/2022   PAD (peripheral artery disease) (HCC) 08/25/2022   Arrhythmia as indication for cardiac pacemaker replacement 03/11/2020   Vitamin D  deficiency 09/13/2019   History of intracranial hemorrhage History of severe encephalopathy History of CVA/TIA/progressive leukoencephalopathy and chronic left ICA stenosis 03/12/2019   Right sided weakness 03/12/2019   Anxiety 03/12/2019   PVD (peripheral vascular disease) (HCC) 04/12/2017   Carotid arterial disease (HCC) 04/12/2017    Orientation RESPIRATION BLADDER Height & Weight     Self, Place  Normal Incontinent Weight: 149 lb 11.1 oz (67.9 kg) Height:  5' 6 (167.6 cm)  BEHAVIORAL  SYMPTOMS/MOOD NEUROLOGICAL BOWEL NUTRITION STATUS      Incontinent Diet (See DC Summary)  AMBULATORY STATUS COMMUNICATION OF NEEDS Skin   Total Care Verbally Normal                       Personal Care Assistance Level of Assistance  Bathing, Feeding, Dressing Bathing Assistance: Maximum assistance Feeding assistance: Limited assistance Dressing Assistance: Maximum assistance     Functional Limitations Info  Sight, Hearing, Speech Sight Info: Impaired Hearing Info: Adequate Speech Info: Adequate    SPECIAL CARE FACTORS FREQUENCY                       Contractures Contractures Info: Not present    Additional Factors Info  Code Status, Allergies Code Status Info: FULL Allergies Info: No Known Allergies           Current Medications (04/16/2024):  This is the current hospital active medication list Current Facility-Administered Medications  Medication Dose Route Frequency Provider Last Rate Last Admin   acetaminophen  (TYLENOL ) tablet 650 mg  650 mg Oral Q6H PRN Raymona Caldwell, MD   650 mg at 04/16/24 0131   Or   acetaminophen  (TYLENOL ) suppository 650 mg  650 mg Rectal Q6H PRN Raymona Caldwell, MD       acidophilus (RISAQUAD) capsule 2 capsule  2 capsule Oral Daily Vann, Jessica U, DO   2 capsule at 04/16/24 1006   azithromycin (ZITHROMAX) tablet 500 mg  500 mg Oral Once Auston Blush, MD       ondansetron  (ZOFRAN ) tablet 4 mg  4 mg Oral Q6H PRN Raymona Caldwell,  MD       Or   ondansetron  (ZOFRAN ) injection 4 mg  4 mg Intravenous Q6H PRN Raymona Caldwell, MD       potassium chloride  (KLOR-CON ) packet 40 mEq  40 mEq Oral BID Vann, Jessica U, DO   40 mEq at 04/16/24 1006   sodium chloride  flush (NS) 0.9 % injection 10-40 mL  10-40 mL Intracatheter PRN Vann, Jessica U, DO         Discharge Medications: Please see discharge summary for a list of discharge medications.  Relevant Imaging Results:  Relevant Lab Results:   Additional Information SSN:  [id number]  Marty Sleet, LCSW

## 2024-04-17 DIAGNOSIS — K567 Ileus, unspecified: Secondary | ICD-10-CM | POA: Diagnosis not present

## 2024-04-17 LAB — BASIC METABOLIC PANEL WITH GFR
Anion gap: 11 (ref 5–15)
BUN: <5 mg/dL — ABNORMAL LOW (ref 6–20)
CO2: 16 mmol/L — ABNORMAL LOW (ref 22–32)
Calcium: 7.8 mg/dL — ABNORMAL LOW (ref 8.9–10.3)
Chloride: 112 mmol/L — ABNORMAL HIGH (ref 98–111)
Creatinine, Ser: 1.07 mg/dL (ref 0.61–1.24)
GFR, Estimated: >60 mL/min (ref >60)
Glucose, Bld: 157 mg/dL — ABNORMAL HIGH (ref 70–99)
Potassium: 3.2 mmol/L — ABNORMAL LOW (ref 3.5–5.1)
Sodium: 139 mmol/L (ref 135–145)

## 2024-04-17 MED ORDER — POLYETHYLENE GLYCOL 3350 17 G PO PACK
17.0000 g | PACK | Freq: Every day | ORAL | Status: DC
  Administered 2024-04-17 – 2024-04-18 (×2): 17 g via ORAL
  Filled 2024-04-17 (×2): qty 1

## 2024-04-17 MED ORDER — SENNOSIDES-DOCUSATE SODIUM 8.6-50 MG PO TABS
1.0000 | ORAL_TABLET | Freq: Every day | ORAL | Status: DC
  Administered 2024-04-17: 1 via ORAL
  Filled 2024-04-17: qty 1

## 2024-04-17 MED ORDER — BISACODYL 10 MG RE SUPP: 10.0000 mg | Freq: Every day | RECTAL | Status: DC | PRN

## 2024-04-17 NOTE — Plan of Care (Signed)

## 2024-04-17 NOTE — Plan of Care (Signed)

## 2024-04-17 NOTE — Progress Notes (Signed)
 Progress Note    Daniel Stuart  JXB:[id number] DOB: 01-12-1966  DOA: 04/13/2024 PCP: Paseda, Folashade R, [name]    Brief Narrative:      Daniel Stuart is an 58 y.o. male  with medical history significant of ICH with residual right sided weakness and bedbound, progressive leukoencephalopathy, PAD/left ICA stenosis, cortical blindness,  PPM, hx of CVA, HTN, HLD, antiphospholipid syndrome and persistent colonic ileus who presented to ED with concerns for abdominal distention.    Sister gives history. When he was discharged from the hospital he has only been on a liquid diet. Discharge summary reports advance diet as tolerated, but the SNF has not done this. He had a GI follow up on 04/02/24, but missed this appointment due to facility taking him to wrong place. Yesterday they noticed that his stomach was more distended and a KUB was taken. He was then sent to ED.    Admitted in may 2025 for pseudo obstruction of colon and managed conservatively. Followed by [facility] for persistent colonic ileus.     Assessment/Plan:   Principal Problem:   Ileus (HCC) Active Problems:   Hypokalemia   airspace disease   History of intracranial hemorrhage History of severe encephalopathy History of CVA/TIA/progressive leukoencephalopathy and chronic left ICA stenosis   Antiphospholipid syndrome (HCC)   Pressure injury of skin   Essential hypertension   Prolonged QT interval    Ileus (HCC) 58 year old bedbound male with history of persistent ileus presented to ED after being examined and found to have increased abdominal distention  -he has no abdominal pain, N/V/D or complaints --6/17 patient not taking in much by mouth as compared to prior - Aggressively replete potassium and magnesium -labs pending from 617 still -GI consulted, f/u on recommendations-they have signed off-per Dr. Tova Fresh slowly advance diet -lactic acid wnl. -Move in bed  Hypokalemia -Aggressively replete - For discharge  needs to be stable around 4   airspace disease suggestive of pneumonia CT abdomen/pelvis reads as new airspace disease in lateral segment of RLL suggests pneumonia; however, he has had no fever/chills, shortness of breath/coughing or other clinical signs/symptoms of pneumonia.  He has no SIRS criteria Denies any signs/symptoms of aspiration Given rocephin and azithromycin in ED Low suspicion of pneumonia and procalcitonin negative so will not continue any further antibiotics   History of intracranial hemorrhage History of severe encephalopathy History of CVA/TIA/progressive leukoencephalopathy and chronic left ICA stenosis -Patient has history of severe progressive encephalopathy and intracranial hemorrhage.  Previously was on aspirin , Eliquis and Plavix  everything has been discontinued after patient developed intracranial hemorrhage in 2024.     Antiphospholipid syndrome (HCC) Off all AC s/p ICH in 2024    Pressure injury of skin Right lateral malleolus with scabbed over pressure wound, about 2cm.  No evidence of infection. Healed over. Continue boot, turning    Essential hypertension No medication, monitor    Prolonged QT interval Optimize electrolytes Keep on telemetry Avoid qt prolonging drugs     Back to facility once p.o. intake stable and potassium within normal limits    Family Communication/Anticipated D/C date and plan/Code Status   DVT prophylaxis: Lovenox  ordered. Code Status: Full Code.  Disposition Plan: Status is: Inpatient      Medical Consultants:   GI  Subjective:   Not eating much this morning, only wanting juice  Objective:    Vitals:   04/16/24 0533 04/16/24 1238 04/16/24 1927 04/17/24 0406  BP: 101/71 113/87 111/76 103/72  Pulse: 77 79 84 84  Resp: 15 16 20 18   Temp: 98.1 F (36.7 C) 98 F (36.7 C) 98.4 F (36.9 C) 98.1 F (36.7 C)  TempSrc: Oral Oral    SpO2: 97% 98% 98% 96%  Weight:      Height:        Intake/Output Summary  (Last 24 hours) at 04/17/2024 1041 Last data filed at 04/17/2024 0836 Gross per 24 hour  Intake 859 ml  Output 1750 ml  Net -891 ml   Filed Weights   04/13/24 1913  Weight: 67.9 kg    Exam:  General: Appearance:  Chronically ill male in no acute distress   Bowel sounds decreased today  Lungs:      respirations unlabored  Heart:    Normal heart rate.   MS:   All extremities are intact.   Neurologic:   Awake, alert     Data Reviewed:   I have personally reviewed following labs and imaging studies:  Labs: Labs show the following:   Basic Metabolic Panel: Recent Labs  Lab 04/13/24 1253 04/13/24 1856 04/14/24 0544 04/14/24 1719 04/15/24 0335 04/16/24 0213  NA 138  --  139 142 140 137  K 2.5*  --  2.8* 3.1* 3.1* 3.3*  CL 96*  --  106 110 111 111  CO2 25  --  20* 19* 19* 17*  GLUCOSE 95  --  83 96 96 93  BUN 17  --  10 7 <5* <5*  CREATININE 1.33*  --  1.09 0.99 1.01 1.03  CALCIUM  8.7*  --  7.8* 7.5* 7.2* 7.2*  MG  --  1.8  --   --   --  2.0   GFR Estimated Creatinine Clearance: 68.8 mL/min (by C-G formula based on SCr of 1.03 mg/dL). Liver Function Tests: Recent Labs  Lab 04/13/24 1253 04/14/24 0544  AST 17 14*  ALT 13 11  ALKPHOS 108 86  BILITOT 0.7 1.0  PROT 7.6 6.2*  ALBUMIN 3.0* 2.4*   No results for input(s): LIPASE, AMYLASE in the last 168 hours. No results for input(s): AMMONIA in the last 168 hours. Coagulation profile No results for input(s): INR, PROTIME in the last 168 hours.  CBC: Recent Labs  Lab 04/13/24 1253 04/14/24 0544 04/15/24 0335  WBC 9.3 7.8 7.4  HGB 13.4 12.0* 11.0*  HCT 41.3 41.1 34.0*  MCV 84.1 91.7 82.5  PLT 329 284 267   Cardiac Enzymes: No results for input(s): CKTOTAL, CKMB, CKMBINDEX, TROPONINI in the last 168 hours. BNP (last 3 results) No results for input(s): PROBNP in the last 8760 hours. CBG: No results for input(s): GLUCAP in the last 168 hours. D-Dimer: No results for input(s):  DDIMER in the last 72 hours. Hgb A1c: No results for input(s): HGBA1C in the last 72 hours. Lipid Profile: No results for input(s): CHOL, HDL, LDLCALC, TRIG, CHOLHDL, LDLDIRECT in the last 72 hours. Thyroid  function studies: No results for input(s): TSH, T4TOTAL, T3FREE, THYROIDAB in the last 72 hours.  Invalid input(s): FREET3  Anemia work up: No results for input(s): VITAMINB12, FOLATE, FERRITIN, TIBC, IRON, RETICCTPCT in the last 72 hours. Sepsis Labs: Recent Labs  Lab 04/13/24 1253 04/13/24 1856 04/13/24 2134 04/13/24 2337 04/14/24 0544 04/15/24 0335  PROCALCITON  --  <0.10  --   --  <0.10  --   WBC 9.3  --   --   --  7.8 7.4  LATICACIDVEN  --   --  1.7 1.0  --   --  Microbiology No results found for this or any previous visit (from the past 240 hours).  Procedures and diagnostic studies:  No results found.   Medications:    acidophilus  2 capsule Oral Daily   azithromycin  500 mg Oral Once   polyethylene glycol  17 g Oral Daily   potassium chloride   40 mEq Oral BID   senna-docusate  1 tablet Oral QHS   Continuous Infusions:     LOS: 4 days   Enrigue Harvard  [facility]   How to contact the [facility] Attending or Consulting provider 7A - 7P or covering provider during after hours 7P -7A, for this patient?  Check the care team in [facility] and look for a) attending/consulting [facility] provider listed and b) the [facility] team listed Log into [email or web address] and use [place]'s universal password to access. If you do not have the password, please contact the hospital operator. Locate the [facility] provider you are looking for under [facility] and page to a number that you can be directly reached. If you still have difficulty reaching the provider, please page the [facility] (Director on Call) for the Hospitalists listed on amion for assistance.  04/17/2024, 10:41 AM

## 2024-04-18 ENCOUNTER — Ambulatory Visit: Payer: Self-pay | Admitting: Nurse Practitioner

## 2024-04-18 DIAGNOSIS — E876 Hypokalemia: Secondary | ICD-10-CM

## 2024-04-18 DIAGNOSIS — I10 Essential (primary) hypertension: Secondary | ICD-10-CM

## 2024-04-18 DIAGNOSIS — R9431 Abnormal electrocardiogram [ECG] [EKG]: Secondary | ICD-10-CM

## 2024-04-18 DIAGNOSIS — Z8673 Personal history of transient ischemic attack (TIA), and cerebral infarction without residual deficits: Secondary | ICD-10-CM

## 2024-04-18 DIAGNOSIS — D6861 Antiphospholipid syndrome: Secondary | ICD-10-CM

## 2024-04-18 DIAGNOSIS — K567 Ileus, unspecified: Secondary | ICD-10-CM | POA: Diagnosis not present

## 2024-04-18 LAB — HIV-1/2 AB - DIFFERENTIATION
HIV 1 Ab: NONREACTIVE
HIV 2 Ab: NONREACTIVE
Note: NEGATIVE

## 2024-04-18 LAB — BASIC METABOLIC PANEL WITH GFR
Anion gap: 7 (ref 5–15)
BUN: <5 mg/dL — ABNORMAL LOW (ref 6–20)
CO2: 17 mmol/L — ABNORMAL LOW (ref 22–32)
Calcium: 7.8 mg/dL — ABNORMAL LOW (ref 8.9–10.3)
Chloride: 111 mmol/L (ref 98–111)
Creatinine, Ser: 0.95 mg/dL (ref 0.61–1.24)
GFR, Estimated: >60 mL/min (ref >60)
Glucose, Bld: 97 mg/dL (ref 70–99)
Potassium: 3.8 mmol/L (ref 3.5–5.1)
Sodium: 135 mmol/L (ref 135–145)

## 2024-04-18 LAB — HIV-1/HIV-2 QUALITATIVE RNA
Final Interpretation: NEGATIVE
HIV-1 RNA, Qualitative: NONREACTIVE
HIV-2 RNA, Qualitative: NONREACTIVE

## 2024-04-18 LAB — CBC
HCT: 38.4 % — ABNORMAL LOW (ref 39.0–52.0)
Hemoglobin: 12.1 g/dL — ABNORMAL LOW (ref 13.0–17.0)
MCH: 27.1 pg (ref 26.0–34.0)
MCHC: 31.5 g/dL (ref 30.0–36.0)
MCV: 86.1 fL (ref 80.0–100.0)
Platelets: 207 10*3/uL (ref 150–400)
RBC: 4.46 MIL/uL (ref 4.22–5.81)
RDW: 15.1 % (ref 11.5–15.5)
WBC: 8.2 10*3/uL (ref 4.0–10.5)
nRBC: 0 % (ref 0.0–0.2)

## 2024-04-18 MED ORDER — BISACODYL 10 MG RE SUPP: 10.0000 mg | Freq: Every day | RECTAL | Status: AC | PRN

## 2024-04-18 MED ORDER — POTASSIUM CHLORIDE 20 MEQ PO PACK: 40.0000 meq | PACK | Freq: Two times a day (BID) | ORAL | Status: DC

## 2024-04-18 MED ORDER — SENNOSIDES-DOCUSATE SODIUM 8.6-50 MG PO TABS: 1.0000 | ORAL_TABLET | Freq: Every day | ORAL | Status: AC

## 2024-04-18 MED ORDER — RISAQUAD PO CAPS: 2.0000 | ORAL_CAPSULE | Freq: Every day | ORAL | Status: DC

## 2024-04-18 MED ORDER — ONDANSETRON HCL 4 MG PO TABS: 4.0000 mg | ORAL_TABLET | Freq: Four times a day (QID) | ORAL | Status: AC | PRN

## 2024-04-18 MED ORDER — VITAMIN D3 25 MCG PO TABS: 1000.0000 [IU] | ORAL_TABLET | Freq: Every day | ORAL | Status: AC

## 2024-04-18 NOTE — Plan of Care (Signed)

## 2024-04-18 NOTE — Discharge Summary (Addendum)
 Physician Discharge Summary   Patient: Daniel Stuart MRN: [id number] DOB: [birth year]  Admit date:     04/13/2024  Discharge date: 04/18/24  Discharge Physician: Bertram Brocks, MD    PCP: Paseda, Folashade R, [name]   Recommendations at discharge:   Continue good bowel regimen with MiraLAX  twice daily, Senokot-S, Dulcolax suppository, enema as needed every other day PT OT, increase mobility, change patient's position every 2-3 hours in bed Liberalize fluid intake. Need GI follow-up, has appointment with [facility], Brigitte Canard, NP on 05/11/2024 at 2:10 PM.  Follow labs, BMET, magnesium  on Friday, 04/20/2024, keep potassium around 4, magnesium  above 2  Discharge Diagnoses:  Chronic ileus (HCC)   Hypokalemia   History of intracranial hemorrhage History of severe encephalopathy History of CVA/TIA/progressive leukoencephalopathy and chronic left ICA stenosis   Antiphospholipid syndrome (HCC)   Pressure injury of skin   Essential hypertension   Prolonged QT interval    Hospital Course:  Per admission H&P by Dr Francesco Inks on 04/13/2024 Daniel Stuart is an 58 y.o. male  with medical history significant of ICH with residual right sided weakness and bedbound, progressive leukoencephalopathy, PAD/left ICA stenosis, cortical blindness,  PPM, hx of CVA, HTN, HLD, antiphospholipid syndrome and persistent colonic ileus who presented to ED with concerns for abdominal distention.    Sister gives history. When he was discharged from the hospital he has only been on a liquid diet. Discharge summary reports advance diet as tolerated, but the SNF has not done this. He had a GI follow up on 04/02/24, but missed this appointment due to facility taking him to wrong place. Yesterday they noticed that his stomach was more distended and a KUB was taken. He was then sent to ED.    Admitted in may 2025 for pseudo obstruction of colon and managed conservatively. Followed by [facility] for persistent  colonic ileus.    Assessment and Plan:  Ileus (HCC) -History of chronic ileus for several years, bedbound with progressive leukoencephalopathy, followed by [facility] gastroenterology.  Has recurrent issues with ileus, abdominal distention. - Admitted again with concerns for abdominal distention.  GI was consulted, seen by Dr. Tova Fresh.  Patient reported no abdominal pain nausea or vomiting - Fluid intake was liberalized, recommended good bowel regimen, keep potassium~4, magnesium ~2 - Currently no nausea vomiting, abdominal pain or distention.  Had a BM this morning.  Diet advanced to soft solids.   Hypokalemia -Aggressively replete - Continue potassium 40 meq twice daily, follow BMET, Mag labs on 04/20/2024   Airspace disease suggestive of pneumonia CT abdomen/pelvis reads as new airspace disease in lateral segment of RLL suggests pneumonia; however, he has had no fever/chills, shortness of breath/coughing or other clinical signs/symptoms of pneumonia.  He has no SIRS criteria Denied any signs/symptoms of aspiration, received IV Rocephin and Zithromax in ED. Low suspicion of pneumonia and procalcitonin negative so will not continue any further antibiotics   History of intracranial hemorrhage History of severe encephalopathy History of CVA/TIA/progressive leukoencephalopathy and chronic left ICA stenosis -Patient has history of severe progressive encephalopathy and intracranial hemorrhage.  Previously was on aspirin , Eliquis and Plavix  everything has been discontinued after patient developed intracranial hemorrhage in 2024.     Antiphospholipid syndrome (HCC) Off all AC s/p ICH in 2024    Pressure injury of skin Right lateral malleolus with scabbed over pressure wound, about 2cm.  No evidence of infection. Healed over. Continue boot, turning    Essential hypertension No medication, monitor    Prolonged QT  interval Optimize electrolytes.  Reglan  has been held. Avoid qt prolonging drugs              Pain control - [place]  Controlled Substance Reporting System database was reviewed. and patient was instructed, not to drive, operate heavy machinery, perform activities at heights, swimming or participation in water activities or provide baby-sitting services while on Pain, Sleep and Anxiety Medications; until their outpatient Physician has advised to do so again. Also recommended to not to take more than prescribed Pain, Sleep and Anxiety Medications.  Consultants: Gastroenterology, Dr. Tova Fresh Procedures performed: None Disposition: Skilled nursing facility Diet recommendation: SOFT diet   DISCHARGE MEDICATION: Allergies as of 04/18/2024   No Known Allergies      Medication List     STOP taking these medications    metoCLOPramide  5 MG tablet Commonly known as: REGLAN        TAKE these medications    acetaminophen  325 MG tablet Commonly known as: TYLENOL  Take 2 tablets (650 mg total) by mouth every 6 (six) hours as needed for mild pain, moderate pain, fever or headache (or temp > 37.5 C (99.5 F)).   acidophilus Caps capsule Take 2 capsules by mouth daily.   bisacodyl 10 MG suppository Commonly known as: DULCOLAX Place 1 suppository (10 mg total) rectally daily as needed for mild constipation.   cyanocobalamin  1000 MCG tablet Take 1 tablet (1,000 mcg total) by mouth daily.   feeding supplement Liqd Take 237 mLs by mouth 2 (two) times daily between meals. What changed:  when to take this additional instructions   nutrition supplement (JUVEN) Pack Take 1 packet by mouth 2 (two) times daily between meals. What changed: Another medication with the same name was changed. Make sure you understand how and when to take each.   gabapentin  300 MG capsule Commonly known as: NEURONTIN  TAKE 1 CAPSULE BY MOUTH BY MOUTH TWO TIMES DAILY What changed:  how much to take how to take this when to take this   ipratropium-albuterol 0.5-2.5 (3) MG/3ML  Soln Commonly known as: DUONEB Take 3 mLs by nebulization every 6 (six) hours as needed (SOB, wheezing).   ondansetron  4 MG tablet Commonly known as: ZOFRAN  Take 1 tablet (4 mg total) by mouth every 6 (six) hours as needed for nausea.   pantoprazole  40 MG tablet Commonly known as: PROTONIX  Take 1 tablet (40 mg total) by mouth at bedtime.   polyethylene glycol 17 g packet Commonly known as: MIRALAX  / GLYCOLAX  Take 17 g by mouth 2 (two) times daily.   potassium chloride  20 MEQ packet Commonly known as: KLOR-CON  Take 40 mEq by mouth 2 (two) times daily.   senna-docusate 8.6-50 MG tablet Commonly known as: Senokot-S Take 1 tablet by mouth at bedtime.   simethicone  80 MG chewable tablet Commonly known as: MYLICON Chew 1 tablet (80 mg total) by mouth 4 (four) times daily -  before meals and at bedtime. What changed: when to take this   sodium phosphate Enem Place 1 enema rectally every other day.   vitamin D3 25 MCG tablet Commonly known as: CHOLECALCIFEROL  Take 1 tablet (1,000 Units total) by mouth daily.        Follow-up Information     Paseda, Folashade R, [name]. Schedule an appointment as soon as possible for a visit in 2 week(s).   Specialty: Nurse Practitioner Why: for hospital follow-up Contact information: [address] [place] [address] [phone]  Discharge Exam: Filed Weights   04/13/24 1913  Weight: 67.9 kg   S: States had a BM this morning, no nausea vomiting, abdominal pain or distention.  Feels better.  Tolerating diet   BP 117/78 (BP Location: Right Arm)   Pulse 83   Temp 98.5 F (36.9 C)   Resp 18   Ht 5' 6 (1.676 m)   Wt 67.9 kg   SpO2 96%   BMI 24.16 kg/m   Physical Exam General: Alert and oriented x 3, NAD Cardiovascular: S1 S2 clear, RRR.  Respiratory: CTAB, no wheezing Gastrointestinal: Soft, NT, ND, NBS  Ext: no pedal edema bilaterally Psych: awake and alert   Condition at  discharge: fair  The results of significant diagnostics from this hospitalization (including imaging, microbiology, ancillary and laboratory) are listed below for reference.   Imaging Studies: DG Abd Portable 1V Result Date: 04/14/2024 CLINICAL DATA:  [id number] Ileus [facility]) [id number] EXAM: PORTABLE ABDOMEN - 1 VIEW COMPARISON:  April 13, 2024 FINDINGS: Persistent gaseous dilation of the colon. Extent of dilation is favored similar to minimally improved since prior CT. Evaluation for free air is limited due to supine positioning. Bilateral nonobstructing nephrolithiasis. IMPRESSION: Persistent gaseous dilation of the colon consistent with reported colonic ileus. Extent of dilation is favored similar to minimally improved since prior CT. Electronically Signed   By: Clancy Crimes M.D.   On: 04/14/2024 13:32   CT ABDOMEN PELVIS W CONTRAST Result Date: 04/13/2024 CLINICAL DATA:  Acute abdominal pain, acute nonlocalized. History of colonic ileus EXAM: CT ABDOMEN AND PELVIS WITH CONTRAST TECHNIQUE: Multidetector CT imaging of the abdomen and pelvis was performed using the standard protocol following bolus administration of intravenous contrast. RADIATION DOSE REDUCTION: This exam was performed according to the departmental dose-optimization program which includes automated exposure control, adjustment of the mA and/or kV according to patient size and/or use of iterative reconstruction technique. CONTRAST:  OMNIPAQUE  IOHEXOL  300 MG/ML  SOLN COMPARISON:  CT 03/06/2024 FINDINGS: Lower chest: Segmental consolidation lateral aspect of the RIGHT lower lobe (image 34/series 5 is new finding from prior. Hepatobiliary: No focal hepatic lesion. Normal gallbladder. No biliary duct dilatation. Common bile duct is normal. Pancreas: Pancreas is normal. No ductal dilatation. No pancreatic inflammation. Spleen: Normal spleen Adrenals/urinary tract: Adrenal glands normal. Nonobstructing calculi within the kidneys. Ureters and  bladder normal. Stomach/Bowel: Stomach is decompressed. The small bowel is decompressed. No evidence small bowel obstruction. Terminal ileum is normal. Appendix normal. The ascending and transverse colon is gas-filled and distended. Transverse colon measures 7.5 cm diameter. There is fluid stool in the descending colon and rectosigmoid colon. The LEFT colon is ahaustral. Findings are similar to comparison exam with mild increase in gaseous distension. No pneumatosis. No intraperitoneal free air Vascular/Lymphatic: Abdominal aorta is normal caliber. No periportal or retroperitoneal adenopathy. No pelvic adenopathy. Reproductive: Unremarkable Other: No free fluid. Musculoskeletal: No aggressive osseous lesion. IMPRESSION: 1. Persistent colonic ileus pattern. Gaseous distention of the colon slightly increased. Fluid stool in the a ahaustral left colon. 2. New airspace disease in lateral segment of the RIGHT lower lobe suggest pneumonia. Electronically Signed   By: Deboraha Fallow M.D.   On: 04/13/2024 15:34    Microbiology: Results for orders placed or performed during the hospital encounter of 02/23/24  Culture, blood (Routine X 2) w Reflex to ID Panel     Status: None   Collection Time: 02/24/24 12:38 AM   Specimen: BLOOD LEFT ARM  Result Value Ref Range Status   Specimen Description  BLOOD LEFT ARM  Final   Special Requests   Final    BOTTLES DRAWN AEROBIC AND ANAEROBIC Blood Culture adequate volume   Culture   Final    NO GROWTH 5 DAYS Performed at [facility], [address], [place], [place] [address]    Report Status 02/29/2024 FINAL  Final  Culture, blood (Routine X 2) w Reflex to ID Panel     Status: None   Collection Time: 02/24/24 12:40 AM   Specimen: BLOOD LEFT HAND  Result Value Ref Range Status   Specimen Description BLOOD LEFT HAND  Final   Special Requests   Final    BOTTLES DRAWN AEROBIC AND ANAEROBIC Blood Culture adequate volume   Culture   Final    NO GROWTH 5  DAYS Performed at [facility], [address], [place], [place] [address]    Report Status 02/29/2024 FINAL  Final  C Difficile Quick Screen w PCR reflex     Status: None   Collection Time: 02/24/24  2:20 AM   Specimen: STOOL  Result Value Ref Range Status   C Diff antigen NEGATIVE NEGATIVE Final   C Diff toxin NEGATIVE NEGATIVE Final   C Diff interpretation No C. difficile detected.  Final    Comment: Performed at [facility], [address], [place], [place] [address]  MRSA Next Gen by PCR, Nasal     Status: Abnormal   Collection Time: 02/24/24  4:45 AM   Specimen: Nasal Mucosa; Nasal Swab  Result Value Ref Range Status   MRSA by PCR Next Gen DETECTED (A) NOT DETECTED Final    Comment: RESULT CALLED TO, READ BACK BY AND VERIFIED WITH: M YOUNG,RN@[phone]  02/24/24 MK (NOTE) The GeneXpert MRSA Assay (FDA approved for NASAL specimens only), is one component of a comprehensive MRSA colonization surveillance program. It is not intended to diagnose MRSA infection nor to guide or monitor treatment for MRSA infections. Test performance is not FDA approved in patients less than 1 years old. Performed at [facility], [address], [place], [place] [address]     Labs: CBC: Recent Labs  Lab 04/13/24 1253 04/14/24 0544 04/15/24 0335 04/18/24 0317  WBC 9.3 7.8 7.4 8.2  HGB 13.4 12.0* 11.0* 12.1*  HCT 41.3 41.1 34.0* 38.4*  MCV 84.1 91.7 82.5 86.1  PLT 329 284 267 207   Basic Metabolic Panel: Recent Labs  Lab 04/13/24 1856 04/14/24 0544 04/14/24 1719 04/15/24 0335 04/16/24 0213 04/17/24 1001 04/18/24 0317  NA  --    < > 142 140 137 139 135  K  --    < > 3.1* 3.1* 3.3* 3.2* 3.8  CL  --    < > 110 111 111 112* 111  CO2  --    < > 19* 19* 17* 16* 17*  GLUCOSE  --    < > 96 96 93 157* 97  BUN  --    < > 7 <5* <5* <5* <5*  CREATININE  --    < > 0.99 1.01 1.03 1.07 0.95  CALCIUM   --    < > 7.5* 7.2* 7.2* 7.8* 7.8*  MG 1.8  --   --   --  2.0  --   --     < > = values in this interval not displayed.   Liver Function Tests: Recent Labs  Lab 04/13/24 1253 04/14/24 0544  AST 17 14*  ALT 13 11  ALKPHOS 108  86  BILITOT 0.7 1.0  PROT 7.6 6.2*  ALBUMIN 3.0* 2.4*   CBG: No results for input(s): GLUCAP in the last 168 hours.  Discharge time spent: greater than 30 minutes.  Signed: Bertram Brocks, MD [facility] 04/18/2024

## 2024-04-18 NOTE — TOC Transition Note (Signed)
 Transition of Care [facility]) - Discharge Note   Patient Details  Name: Daniel Stuart MRN: [id number] Date of Birth: 10/06/1966  Transition of Care [facility]) CM/SW Contact:  Marty Sleet, LCSW Phone Number: 04/18/2024, 10:49 AM   Clinical Narrative:    Pt to return to his LTC facility at [facility] today. Pt will be going to room 201. RN to call report to [phone]. DC packet placed at RN station. Confirmed DC plans with pt's sister, [name]. PTAR called at 11:39am for transportation.    Final next level of care: Long Term Nursing Home Barriers to Discharge: No Barriers Identified   Patient Goals and CMS Choice Patient states their goals for this hospitalization and ongoing recovery are:: For pt to return to [facility].gov Compare Post Acute Care list provided to:: Patient Represenative (must comment) Choice offered to / list presented to : Sibling [place] ownership interest in [facility].provided to:: Sibling    Discharge Placement              Patient [facility] bed at: [facility] Patient to be transferred to facility by: PTAR Name of family member notified: Sister, [name] Patient and family notified of of transfer: 04/18/24  Discharge Plan and Services Additional resources added to the After Visit Summary for   In-house Referral: Clinical Social Work Discharge Planning Services: NA Post Acute Care Choice: Skilled Nursing Facility, Resumption of Svcs/PTA Provider          DME Arranged: N/A DME Agency: NA                  Social Drivers of Health (SDOH) Interventions SDOH Screenings   Food Insecurity: No Food Insecurity (04/13/2024)  Housing: Low Risk  (04/13/2024)  Transportation Needs: No Transportation Needs (04/13/2024)  Utilities: Patient Declined (04/13/2024)  Alcohol Screen: Low Risk  (07/31/2021)  Depression (PHQ2-9): Low Risk  (04/13/2022)  Financial Resource Strain: Medium Risk (07/31/2021)  Physical Activity:  Inactive (07/31/2021)  Social Connections: Socially Isolated (07/31/2021)  Stress: No Stress Concern Present (07/31/2021)  Tobacco Use: High Risk (04/13/2024)     Readmission Risk Interventions    04/16/2024   10:19 AM  Readmission Risk Prevention Plan  Transportation Screening Complete  PCP or Specialist Appt within 5-7 Days Complete  Home Care Screening Complete  Medication Review (RN CM) Complete

## 2024-05-07 ENCOUNTER — Emergency Department (HOSPITAL_COMMUNITY)

## 2024-05-07 ENCOUNTER — Other Ambulatory Visit: Payer: Self-pay

## 2024-05-07 ENCOUNTER — Encounter (HOSPITAL_COMMUNITY)

## 2024-05-07 ENCOUNTER — Inpatient Hospital Stay (HOSPITAL_COMMUNITY)
Admission: EM | Admit: 2024-05-07 | Discharge: 2024-05-11 | DRG: 299 | Disposition: A | Discharge status: Skilled Nursing Facility | Source: Skilled Nursing Facility | Attending: Internal Medicine | Admitting: Internal Medicine

## 2024-05-07 ENCOUNTER — Encounter (HOSPITAL_COMMUNITY): Payer: Self-pay | Admitting: Emergency Medicine

## 2024-05-07 DIAGNOSIS — R9431 Abnormal electrocardiogram [ECG] [EKG]: Secondary | ICD-10-CM | POA: Diagnosis present

## 2024-05-07 DIAGNOSIS — F17210 Nicotine dependence, cigarettes, uncomplicated: Secondary | ICD-10-CM | POA: Diagnosis present

## 2024-05-07 DIAGNOSIS — I82409 Acute embolism and thrombosis of unspecified deep veins of unspecified lower extremity: Secondary | ICD-10-CM | POA: Diagnosis present

## 2024-05-07 DIAGNOSIS — G9349 Other encephalopathy: Secondary | ICD-10-CM | POA: Diagnosis present

## 2024-05-07 DIAGNOSIS — Z8249 Family history of ischemic heart disease and other diseases of the circulatory system: Secondary | ICD-10-CM

## 2024-05-07 DIAGNOSIS — E669 Obesity, unspecified: Secondary | ICD-10-CM | POA: Diagnosis present

## 2024-05-07 DIAGNOSIS — R0902 Hypoxemia: Secondary | ICD-10-CM | POA: Diagnosis not present

## 2024-05-07 DIAGNOSIS — M24561 Contracture, right knee: Secondary | ICD-10-CM | POA: Diagnosis present

## 2024-05-07 DIAGNOSIS — L89514 Pressure ulcer of right ankle, stage 4: Secondary | ICD-10-CM | POA: Diagnosis present

## 2024-05-07 DIAGNOSIS — Z86718 Personal history of other venous thrombosis and embolism: Secondary | ICD-10-CM

## 2024-05-07 DIAGNOSIS — D6861 Antiphospholipid syndrome: Secondary | ICD-10-CM | POA: Diagnosis present

## 2024-05-07 DIAGNOSIS — I80292 Phlebitis and thrombophlebitis of other deep vessels of left lower extremity: Secondary | ICD-10-CM

## 2024-05-07 DIAGNOSIS — I69119 Unspecified symptoms and signs involving cognitive functions following nontraumatic intracerebral hemorrhage: Secondary | ICD-10-CM

## 2024-05-07 DIAGNOSIS — Z6824 Body mass index (BMI) 24.0-24.9, adult: Secondary | ICD-10-CM

## 2024-05-07 DIAGNOSIS — I82422 Acute embolism and thrombosis of left iliac vein: Principal | ICD-10-CM | POA: Diagnosis present

## 2024-05-07 DIAGNOSIS — Z825 Family history of asthma and other chronic lower respiratory diseases: Secondary | ICD-10-CM

## 2024-05-07 DIAGNOSIS — I513 Intracardiac thrombosis, not elsewhere classified: Secondary | ICD-10-CM | POA: Diagnosis present

## 2024-05-07 DIAGNOSIS — E876 Hypokalemia: Secondary | ICD-10-CM | POA: Diagnosis present

## 2024-05-07 DIAGNOSIS — K567 Ileus, unspecified: Principal | ICD-10-CM | POA: Diagnosis present

## 2024-05-07 DIAGNOSIS — E538 Deficiency of other specified B group vitamins: Secondary | ICD-10-CM | POA: Diagnosis present

## 2024-05-07 DIAGNOSIS — G629 Polyneuropathy, unspecified: Secondary | ICD-10-CM | POA: Diagnosis present

## 2024-05-07 DIAGNOSIS — I2782 Chronic pulmonary embolism: Secondary | ICD-10-CM | POA: Diagnosis present

## 2024-05-07 DIAGNOSIS — K625 Hemorrhage of anus and rectum: Secondary | ICD-10-CM | POA: Diagnosis present

## 2024-05-07 DIAGNOSIS — E872 Acidosis, unspecified: Secondary | ICD-10-CM | POA: Diagnosis not present

## 2024-05-07 DIAGNOSIS — Z7401 Bed confinement status: Secondary | ICD-10-CM | POA: Diagnosis not present

## 2024-05-07 DIAGNOSIS — Z5986 Financial insecurity: Secondary | ICD-10-CM

## 2024-05-07 DIAGNOSIS — Z809 Family history of malignant neoplasm, unspecified: Secondary | ICD-10-CM

## 2024-05-07 DIAGNOSIS — E559 Vitamin D deficiency, unspecified: Secondary | ICD-10-CM | POA: Diagnosis present

## 2024-05-07 DIAGNOSIS — I69151 Hemiplegia and hemiparesis following nontraumatic intracerebral hemorrhage affecting right dominant side: Secondary | ICD-10-CM

## 2024-05-07 DIAGNOSIS — Z833 Family history of diabetes mellitus: Secondary | ICD-10-CM

## 2024-05-07 DIAGNOSIS — Z79899 Other long term (current) drug therapy: Secondary | ICD-10-CM

## 2024-05-07 DIAGNOSIS — I82412 Acute embolism and thrombosis of left femoral vein: Secondary | ICD-10-CM | POA: Diagnosis present

## 2024-05-07 DIAGNOSIS — Z950 Presence of cardiac pacemaker: Secondary | ICD-10-CM

## 2024-05-07 DIAGNOSIS — I10 Essential (primary) hypertension: Secondary | ICD-10-CM | POA: Diagnosis present

## 2024-05-07 DIAGNOSIS — I82442 Acute embolism and thrombosis of left tibial vein: Secondary | ICD-10-CM | POA: Diagnosis present

## 2024-05-07 DIAGNOSIS — I82432 Acute embolism and thrombosis of left popliteal vein: Secondary | ICD-10-CM | POA: Diagnosis present

## 2024-05-07 DIAGNOSIS — E785 Hyperlipidemia, unspecified: Secondary | ICD-10-CM | POA: Diagnosis present

## 2024-05-07 DIAGNOSIS — E8721 Acute metabolic acidosis: Secondary | ICD-10-CM | POA: Diagnosis present

## 2024-05-07 LAB — CBC WITH DIFFERENTIAL/PLATELET
Abs Immature Granulocytes: 0.03 K/uL (ref 0.00–0.07)
Basophils Absolute: 0.1 K/uL (ref 0.0–0.1)
Basophils Relative: 1 %
Eosinophils Absolute: 0.1 K/uL (ref 0.0–0.5)
Eosinophils Relative: 2 %
HCT: 43.1 % (ref 39.0–52.0)
Hemoglobin: 13.9 g/dL (ref 13.0–17.0)
Immature Granulocytes: 0 %
Lymphocytes Relative: 26 %
Lymphs Abs: 2.4 K/uL (ref 0.7–4.0)
MCH: 26.8 pg (ref 26.0–34.0)
MCHC: 32.3 g/dL (ref 30.0–36.0)
MCV: 83 fL (ref 80.0–100.0)
Monocytes Absolute: 0.6 K/uL (ref 0.1–1.0)
Monocytes Relative: 6 %
Neutro Abs: 6 K/uL (ref 1.7–7.7)
Neutrophils Relative %: 65 %
Platelets: 188 K/uL (ref 150–400)
RBC: 5.19 MIL/uL (ref 4.22–5.81)
RDW: 15.3 % (ref 11.5–15.5)
WBC: 9.1 K/uL (ref 4.0–10.5)
nRBC: 0 % (ref 0.0–0.2)

## 2024-05-07 LAB — COMPREHENSIVE METABOLIC PANEL WITH GFR
ALT: 16 U/L (ref 0–44)
AST: 16 U/L (ref 15–41)
Albumin: 3.3 g/dL — ABNORMAL LOW (ref 3.5–5.0)
Alkaline Phosphatase: 141 U/L — ABNORMAL HIGH (ref 38–126)
Anion gap: 12 (ref 5–15)
BUN: 13 mg/dL (ref 6–20)
CO2: 19 mmol/L — ABNORMAL LOW (ref 22–32)
Calcium: 8.7 mg/dL — ABNORMAL LOW (ref 8.9–10.3)
Chloride: 104 mmol/L (ref 98–111)
Creatinine, Ser: 1.1 mg/dL (ref 0.61–1.24)
GFR, Estimated: >60 mL/min (ref >60)
Glucose, Bld: 98 mg/dL (ref 70–99)
Potassium: 3 mmol/L — ABNORMAL LOW (ref 3.5–5.1)
Sodium: 135 mmol/L (ref 135–145)
Total Bilirubin: 0.7 mg/dL (ref 0.0–1.2)
Total Protein: 7.9 g/dL (ref 6.5–8.1)

## 2024-05-07 LAB — TYPE AND SCREEN
ABO/RH(D): B POS
Antibody Screen: NEGATIVE

## 2024-05-07 LAB — MAGNESIUM: Magnesium: 1.8 mg/dL (ref 1.7–2.4)

## 2024-05-07 LAB — POC OCCULT BLOOD, ED: Fecal Occult Bld: NEGATIVE

## 2024-05-07 MED ORDER — SODIUM CHLORIDE (PF) 0.9 % IJ SOLN
INTRAMUSCULAR | Status: AC
  Filled 2024-05-07: qty 50

## 2024-05-07 MED ORDER — POTASSIUM CHLORIDE CRYS ER 20 MEQ PO TBCR
40.0000 meq | EXTENDED_RELEASE_TABLET | ORAL | Status: AC
  Administered 2024-05-07: 40 meq via ORAL
  Filled 2024-05-07: qty 2

## 2024-05-07 MED ORDER — IOHEXOL 350 MG/ML SOLN
75.0000 mL | Freq: Once | INTRAVENOUS | Status: AC | PRN
  Administered 2024-05-07: 75 mL via INTRAVENOUS

## 2024-05-07 MED ORDER — PANTOPRAZOLE SODIUM 40 MG IV SOLR
40.0000 mg | Freq: Two times a day (BID) | INTRAVENOUS | Status: DC
  Administered 2024-05-07: 40 mg via INTRAVENOUS
  Filled 2024-05-07: qty 10

## 2024-05-07 MED ORDER — PANTOPRAZOLE SODIUM 40 MG IV SOLR
40.0000 mg | INTRAVENOUS | Status: AC
  Administered 2024-05-07 (×2): 40 mg via INTRAVENOUS
  Filled 2024-05-07 (×2): qty 10

## 2024-05-07 MED ORDER — POTASSIUM CHLORIDE 10 MEQ/100ML IV SOLN
10.0000 meq | INTRAVENOUS | Status: AC
  Administered 2024-05-07 (×2): 10 meq via INTRAVENOUS
  Filled 2024-05-07 (×2): qty 100

## 2024-05-07 MED ORDER — IOHEXOL 350 MG/ML SOLN
100.0000 mL | Freq: Once | INTRAVENOUS | Status: AC | PRN
  Administered 2024-05-07: 100 mL via INTRAVENOUS

## 2024-05-07 NOTE — Progress Notes (Signed)
 VASCULAR LAB    Bilateral lower extremity venous duplex has been performed.  See CV proc for preliminary results.  Gave verbal report to Dr. Simon LIS, [facility], RVT 05/07/2024, 6:36 PM

## 2024-05-07 NOTE — ED Notes (Signed)
 Patient transported to CT

## 2024-05-07 NOTE — ED Triage Notes (Signed)
 Patient presents via EMS from [facility] due to abdominal distention and dark bloody stools. Symptoms began about 2-3 months ago post advancing from a clear liquid diet. Denies pain, constipation, weakness   HX Stroke (left side deficits)   EMS vitals: 109/73 BP 84 HR 98% SPO2 on room air 78 CBG

## 2024-05-07 NOTE — ED Provider Notes (Signed)
 Daniel Stuart EMERGENCY DEPARTMENT AT [facility] Provider Note   CSN: [id number] Arrival date & time: 05/07/24  1143     Patient presents with: Rectal Bleeding   Daniel Stuart is a 58 y.o. male.   58 year old male with a history of ICH with right-sided weakness and contractures and chronic ileus who presents emergency department with abdominal distention and blood in the stool.  History obtained from patient's and his facility.  Started having abdominal distention last night with painless bloody stools.  Has had 4 loose stools.  No nausea or vomiting.  No fevers.  No abdominal surgeries.  Not on blood thinners.       Prior to Admission medications   Medication Sig Start Date End Date Taking? Authorizing Provider  acetaminophen  (TYLENOL ) 325 MG tablet Take 2 tablets (650 mg total) by mouth every 6 (six) hours as needed for mild pain, moderate pain, fever or headache (or temp > 37.5 C (99.5 F)). 06/22/23   Hongalgi, Anand D, MD  acidophilus (RISAQUAD) CAPS capsule Take 2 capsules by mouth daily. 04/18/24   Rai, Nydia POUR, MD  bisacodyl  (DULCOLAX) 10 MG suppository Place 1 suppository (10 mg total) rectally daily as needed for mild constipation. 04/18/24   Rai, Nydia POUR, MD  cyanocobalamin  1000 MCG tablet Take 1 tablet (1,000 mcg total) by mouth daily. 09/11/22   Antoinette Doe, MD  feeding supplement (ENSURE ENLIVE / ENSURE PLUS) LIQD Take 237 mLs by mouth 2 (two) times daily between meals. Patient taking differently: Take 237 mLs by mouth 3 (three) times daily between meals. Chocolate if available 03/02/24   Tobie Yetta HERO, MD  gabapentin  (NEURONTIN ) 300 MG capsule TAKE 1 CAPSULE BY MOUTH BY MOUTH TWO TIMES DAILY Patient taking differently: Take 300 mg by mouth 2 (two) times daily. 04/22/22 04/14/24  Skeet Juliene SAUNDERS, DO  ipratropium-albuterol  (DUONEB) 0.5-2.5 (3) MG/3ML SOLN Take 3 mLs by nebulization every 6 (six) hours as needed (SOB, wheezing).    [provider]   nutrition supplement, JUVEN, (JUVEN) PACK Take 1 packet by mouth 2 (two) times daily between meals. 03/09/24   Patsy Lenis, MD  ondansetron  (ZOFRAN ) 4 MG tablet Take 1 tablet (4 mg total) by mouth every 6 (six) hours as needed for nausea. 04/18/24   Rai, Nydia POUR, MD  pantoprazole  (PROTONIX ) 40 MG tablet Take 1 tablet (40 mg total) by mouth at bedtime. 06/22/23   Hongalgi, Anand D, MD  polyethylene glycol (MIRALAX  / GLYCOLAX ) 17 g packet Take 17 g by mouth 2 (two) times daily. 03/09/24   Patsy Lenis, MD  potassium chloride  (KLOR-CON ) 20 MEQ packet Take 40 mEq by mouth 2 (two) times daily. 04/18/24   Rai, Ripudeep K, MD  senna-docusate (SENOKOT-S) 8.6-50 MG tablet Take 1 tablet by mouth at bedtime. 04/18/24   Rai, Nydia POUR, MD  simethicone  (MYLICON) 80 MG chewable tablet Chew 1 tablet (80 mg total) by mouth 4 (four) times daily -  before meals and at bedtime. Patient taking differently: Chew 80 mg by mouth 4 (four) times daily. 03/02/24   Tobie Yetta HERO, MD  sodium phosphate (FLEET) ENEM Place 1 enema rectally every other day.    [provider]  vitamin D3 (CHOLECALCIFEROL ) 25 MCG tablet Take 1 tablet (1,000 Units total) by mouth daily. 04/18/24 05/27/24  Davia Nydia POUR, MD    Allergies: Patient has no known allergies.    Review of Systems  Updated Vital Signs BP 119/87   Pulse (!) 105  Temp 98.6 F (37 C) (Oral)   Resp 16   SpO2 100%   Physical Exam Vitals and nursing note reviewed.  Constitutional:      General: He is not in acute distress.    Appearance: He is well-developed.  HENT:     Head: Normocephalic and atraumatic.     Right Ear: External ear normal.     Left Ear: External ear normal.     Nose: Nose normal.  Eyes:     Extraocular Movements: Extraocular movements intact.     Conjunctiva/sclera: Conjunctivae normal.     Pupils: Pupils are equal, round, and reactive to light.  Pulmonary:     Effort: Pulmonary effort is normal. No respiratory distress.   Abdominal:     General: There is distension.     Palpations: Abdomen is soft. There is no mass.     Tenderness: There is no abdominal tenderness. There is no guarding.  Genitourinary:    Comments: Chaperoned by patient's RN [name]. No external hemorrhoids or fissures noted on external inspection. No internal masses or hemorroids noted. Normal rectal tone. Brown stool in rectal vault. No melena or gross blood.  Musculoskeletal:     Cervical back: Normal range of motion and neck supple.  Skin:    General: Skin is warm and dry.  Neurological:     Mental Status: He is alert. Mental status is at baseline.  Psychiatric:        Mood and Affect: Mood normal.        Behavior: Behavior normal.     (all labs ordered are listed, but only abnormal results are displayed) Labs Reviewed  COMPREHENSIVE METABOLIC PANEL WITH GFR - Abnormal; Notable for the following components:      Result Value   Potassium 3.0 (*)    CO2 19 (*)    Calcium  8.7 (*)    Albumin 3.3 (*)    Alkaline Phosphatase 141 (*)    All other components within normal limits  CBC WITH DIFFERENTIAL/PLATELET  MAGNESIUM   MAGNESIUM   POC OCCULT BLOOD, ED  TYPE AND SCREEN  ABO/RH    EKG: None  Radiology: CT Head Wo Contrast Addendum Date: 05/07/2024 ADDENDUM REPORT: 05/07/2024 20:40 ADDENDUM: These results were called by telephone at the time of interpretation on 05/07/2024 at 8:40 pm to provider [facility] , who verbally acknowledged these results. Electronically Signed   By: Morgane  Naveau M.D.   On: 05/07/2024 20:40   Result Date: 05/07/2024 CLINICAL DATA:  eval for ICH EXAM: CT HEAD WITHOUT CONTRAST TECHNIQUE: Contiguous axial images were obtained from the base of the skull through the vertex without intravenous contrast. RADIATION DOSE REDUCTION: This exam was performed according to the departmental dose-optimization program which includes automated exposure control, adjustment of the mA and/or kV according to patient size  and/or use of iterative reconstruction technique. COMPARISON:  CT head 06/19/2023 FINDINGS: Brain: Redemonstration of marked bilateral cerebral encephalomalacia. Interval development of hyperdensity conforming to the left occipital gyri. No mass lesion. No extra-axial collection. No mass effect or midline shift. No hydrocephalus. Basilar cisterns are patent. Vascular: No hyperdense vessel. Skull: No acute fracture or focal lesion. Sinuses/Orbits: Paranasal sinuses and mastoid air cells are clear. The orbits are unremarkable. Other: None. IMPRESSION: 1. Interval development of hyperdensity conforming to the left occipital gyri. Finding could be related to an acute hemorrhage. Differential diagnosis includes contrast staining versus infarction with blood brain barrier breakdown. Recommend MRI without contrast for further evaluation. 2. Redemonstration of marked bilateral  cerebral encephalomalacia. Electronically Signed: By: Morgane  Naveau M.D. On: 05/07/2024 20:36   VAS US  LOWER EXTREMITY VENOUS (DVT) (ONLY [facility] & [facility]) Result Date: 05/07/2024  Lower Venous DVT Study Patient Name:  Daniel Stuart  Date of Exam:   05/07/2024 Medical Rec #: [id number]          Accession #:    [id number] Date of Birth: September 01, 1966          Patient Gender: M Patient Age:   16 years Exam Location:  [facility] Procedure:      VAS US  LOWER EXTREMITY VENOUS (DVT) Referring Phys: LAMAR Janayla Marik --------------------------------------------------------------------------------  Indications: Extensive DVT noted in the left lower extremity and IVC by CT.  Risk Factors: Possible antiphospholipid syndrome. Immobility (bed bound at nursing facility). Limitations: Right leg contraction, patient moving leg away from probe, significant abdominal distention and hardness,. Comparison Study: No prior LEV on file Performing Technologist: Alberta Lis RVS  Examination Guidelines: A complete evaluation includes B-mode imaging, spectral Doppler, color  Doppler, and power Doppler as needed of all accessible portions of each vessel. Bilateral testing is considered an integral part of a complete examination. Limited examinations for reoccurring indications may be performed as noted. The reflux portion of the exam is performed with the patient in reverse Trendelenburg.  +---------+---------------+---------+-----------+----------+-------------------+ RIGHT    CompressibilityPhasicitySpontaneityPropertiesThrombus Aging      +---------+---------------+---------+-----------+----------+-------------------+ CFV      Full           Yes      Yes                                      +---------+---------------+---------+-----------+----------+-------------------+ SFJ      Full                                                             +---------+---------------+---------+-----------+----------+-------------------+ FV Prox  Full           Yes      Yes                                      +---------+---------------+---------+-----------+----------+-------------------+ FV Mid   Full           Yes      Yes                                      +---------+---------------+---------+-----------+----------+-------------------+ FV DistalFull           Yes      Yes                                      +---------+---------------+---------+-----------+----------+-------------------+ PFV      Full                                                             +---------+---------------+---------+-----------+----------+-------------------+  POP      Full           Yes      Yes                                      +---------+---------------+---------+-----------+----------+-------------------+ PTV                                                   Not well visualized +---------+---------------+---------+-----------+----------+-------------------+ PERO                                                  Not well visualized  +---------+---------------+---------+-----------+----------+-------------------+   +---------+---------------+---------+-----------+----------+-------------------+ LEFT     CompressibilityPhasicitySpontaneityPropertiesThrombus Aging      +---------+---------------+---------+-----------+----------+-------------------+ CFV      None           Yes      Yes                  Subacute            +---------+---------------+---------+-----------+----------+-------------------+ SFJ      Partial        Yes      Yes                  Subacute            +---------+---------------+---------+-----------+----------+-------------------+ FV Prox  None           No       No                   Age Indeterminate   +---------+---------------+---------+-----------+----------+-------------------+ FV Mid   None           No       No                   Age Indeterminate   +---------+---------------+---------+-----------+----------+-------------------+ FV DistalNone           No       No                   Age Indeterminate   +---------+---------------+---------+-----------+----------+-------------------+ PFV      None           No       No                   Age Indeterminate   +---------+---------------+---------+-----------+----------+-------------------+ POP      None           No       No                   Age Indeterminate   +---------+---------------+---------+-----------+----------+-------------------+ PTV      None                                         Age Indeterminate   +---------+---------------+---------+-----------+----------+-------------------+ PERO  Not well visualized +---------+---------------+---------+-----------+----------+-------------------+ EIV                                                   appears patent in                                                         this limited exam    +---------+---------------+---------+-----------+----------+-------------------+ Unable to visualize iliacs or IVC secondary to abdominal distention and hardness.    Summary: RIGHT: - There is no evidence of deep vein thrombosis in the lower extremity. However, portions of this examination were limited- see technologist comments above.  - Ultrasound characteristics of enlarged lymph nodes are noted in the groin.  LEFT: - Findings consistent with subacute deep vein thrombosis involving the left common femoral vein, and SF junction.  - Findings consistent with age indeterminate deep vein thrombosis involving the left femoral vein, left proximal profunda vein, left popliteal vein, and left posterior tibial veins.  - Ultrasound characteristics of enlarged lymph nodes noted in the groin.  *See table(s) above for measurements and observations.    Preliminary    CT Angio Chest PE W and/or Wo Contrast Result Date: 05/07/2024 CLINICAL DATA:  Abdominal imaging revealed large DVT involving the IVC, left common iliac, left external iliac, and left common femoral vein. EXAM: CT ANGIOGRAPHY CHEST WITH CONTRAST TECHNIQUE: Multidetector CT imaging of the chest was performed using the standard protocol during bolus administration of intravenous contrast. Multiplanar CT image reconstructions and MIPs were obtained to evaluate the vascular anatomy. RADIATION DOSE REDUCTION: This exam was performed according to the departmental dose-optimization program which includes automated exposure control, adjustment of the mA and/or kV according to patient size and/or use of iterative reconstruction technique. CONTRAST:  75mL OMNIPAQUE  IOHEXOL  350 MG/ML SOLN COMPARISON:  CT abdomen/pelvis from 05/07/2024 FINDINGS: Cardiovascular: Suspected chronic embolus in the right middle lobe pulmonary arterial branches, with very small caliber feeders potentially reflecting mild recanalization chronic embolus for example on images 151-163 of series 10.  The right middle lobe demonstrates chronic volume loss and chronic airspace opacity. Suspicion for anterior mural thrombus along the right ventricular outflow tract just proximal to the pulmonic valve, for example on image 101 series 8, consider echocardiographic confirmation. Mediastinum/Nodes: Small type 1 hiatal hernia. Lungs/Pleura: Emphysema. Chronic volume loss and consolidation in the right middle lobe. Mild dependent atelectasis in both lower lobes. Hazy ground-glass density in the lungs, cannot exclude mild edema or alveolitis. No pleural effusion. Upper Abdomen: Prominently dilated upper abdominal large bowel as shown on CT abdomen. Musculoskeletal: Unremarkable Review of the MIP images confirms the above findings. IMPRESSION: 1. Suspected chronic embolus in the right middle lobe pulmonary arterial branches, with very small caliber feeders potentially reflecting mild recanalization of chronic embolus. This is not a characteristic appearance for acute pulmonary embolus. The right middle lobe demonstrates chronic volume loss and chronic airspace opacity. 2. Suspicion for anterior mural thrombus along the right ventricular outflow tract just proximal to the pulmonic valve. Echocardiography could be utilized for confirmation. 3. Hazy ground-glass density in the lungs, cannot exclude mild edema or alveolitis. 4. Prominently dilated upper abdominal large bowel as shown on CT abdomen. 5.  Small type 1 hiatal hernia. 6.  Emphysema (ICD10-J43.9). Electronically Signed   By: Ryan Salvage M.D.   On: 05/07/2024 17:24   CT ANGIO GI BLEED Addendum Date: 05/07/2024 ADDENDUM REPORT: 05/07/2024 15:16 ADDENDUM: Chest CT recommended to exclude pulmonary embolism. Electronically Signed   By: Vanetta Chou M.D.   On: 05/07/2024 15:16   Result Date: 05/07/2024 CLINICAL DATA:  Abdominal distension and rectal bleeding. EXAM: CTA ABDOMEN AND PELVIS WITHOUT AND WITH CONTRAST TECHNIQUE: Multidetector CT imaging of the  abdomen and pelvis was performed using the standard protocol during bolus administration of intravenous contrast. Multiplanar reconstructed images and MIPs were obtained and reviewed to evaluate the vascular anatomy. RADIATION DOSE REDUCTION: This exam was performed according to the departmental dose-optimization program which includes automated exposure control, adjustment of the mA and/or kV according to patient size and/or use of iterative reconstruction technique. CONTRAST:  OMNIPAQUE  IOHEXOL  350 MG/ML SOLN COMPARISON:  CT abdomen pelvis dated 04/13/2024. FINDINGS: VASCULAR Aorta: Mild calcified and noncalcified plaque of the abdominal aorta. No aneurysmal dilatation or dissection. No periaortic fluid collection. Celiac: Patent without evidence of aneurysm, dissection, vasculitis or significant stenosis. SMA: Patent without evidence of aneurysm, dissection, vasculitis or significant stenosis. Renals: Both renal arteries are patent without evidence of aneurysm, dissection, vasculitis, fibromuscular dysplasia or significant stenosis. IMA: Patent without evidence of aneurysm, dissection, vasculitis or significant stenosis. Inflow: Mild atherosclerotic calcification. No aneurysmal dilatation or dissection. The iliac arteries are patent. Proximal Outflow: The visualized proximal outflow is patent. Veins: Large clot extends from the visualized left femoral and deep femoral vein with proximal extension to the IVC at the level of the renal vein. This has significantly progressed since the prior CT. The SMV, splenic vein, and main portal vein are patent. No portal venous gas. Review of the MIP images confirms the above findings. NON-VASCULAR Lower chest: Diffuse chronic interstitial coarsening. Right middle lobe atelectasis. Pneumonia is not excluded. No intra-abdominal free air or free fluid. Hepatobiliary: The liver is unremarkable. No biliary dilatation. The gallbladder is unremarkable. Pancreas: Unremarkable.  No pancreatic ductal dilatation or surrounding inflammatory changes. Spleen: Normal in size without focal abnormality. Adrenals/Urinary Tract: The adrenal glands unremarkable. Multiple nonobstructing bilateral renal calculi measure up to 6 mm. No hydronephrosis or obstructing stone. The visualized ureters and urinary bladder appear unremarkable. Stomach/Bowel: Mild thickened appearance of the rectum may represent proctitis. Clinical correlation recommended. There is loose stool throughout the colon consistent with diarrheal state. Retained oral contrast noted in the colon. Diffuse colonic dilatation similar or slightly progressed since the prior CT consistent with colonic ileus. No evidence of small-bowel obstruction. The appendix is normal. Lymphatic: No adenopathy. Reproductive: The prostate and seminal vesicles are grossly unremarkable. Other: None Musculoskeletal: No acute osseous pathology. IMPRESSION: 1. No evidence of active GI bleed. 2. Large DVT extends from the left lower extremity veins into the IVC. This has significantly progressed since the prior CT. 3. Colonic ileus. No evidence of small-bowel obstruction. Normal appendix. 4. Multiple nonobstructing bilateral renal calculi measure up to 6 mm. No hydronephrosis or obstructing stone. 5. Right middle lobe atelectasis. Pneumonia is not excluded. These results were called by telephone at the time of interpretation on 05/07/2024 at 3:08 pm to provider [facility] , who verbally acknowledged these results. Electronically Signed: By: Vanetta Chou M.D. On: 05/07/2024 15:09     Procedures   Medications Ordered in the ED  pantoprazole  (PROTONIX ) injection 40 mg (40 mg Intravenous Given 05/07/24 1336)    Followed by  pantoprazole  (PROTONIX ) injection 40 mg (has no administration in time range)  potassium chloride  10 mEq in 100 mL IVPB (10 mEq Intravenous New Bag/Given 05/07/24 2109)  potassium chloride  SA (KLOR-CON  M) CR tablet 40 mEq (40 mEq Oral  Given 05/07/24 1508)  iohexol  (OMNIPAQUE ) 350 MG/ML injection 100 mL (100 mLs Intravenous Contrast Given 05/07/24 1411)  iohexol  (OMNIPAQUE ) 350 MG/ML injection 75 mL (75 mLs Intravenous Contrast Given 05/07/24 1603)    Clinical Course as of 05/07/24 2207  Mon May 07, 2024  1630 Signed out to Dr Lavonia [RP]  1801 ICH - Right parieto-occipital ICH with IVH, etiology: 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   CT head   30cc acute hematoma right parieto-occipital region with small volume intraventricular extension and subarachnoid extension.   Extensive encephalomalacia of the bilateral cerebral cortex  CTA head & neck   Absent left ICA with robust intracranial reconstitution. Small carotid canal suggests occlusion early in life.   [TL]  2058 Needs MRI brain w/wo  [TL]    Clinical Course User Index [RP] Yolande Lamar BROCKS, MD [TL] Simon Lavonia SAILOR, MD                                 Medical Decision Making Amount and/or Complexity of Data Reviewed Labs: ordered. Radiology: ordered.  Risk Prescription drug management.   58 year old male with a history of ICH with right-sided weakness and contractures and chronic ileus who presents emergency department with abdominal distention and blood in the stool.  Initial Ddx:  Ileus, SBO, GI bleed, symptomatic anemia, colitis  MDM/Course:  Patient presents to the emergency department with abdominal distention and reports of blood in the stool.  On exam is overall chronically ill-appearing but not in acute distress.  Does have some old contractures from the prior hemorrhagic stroke.  Does have some abdominal distention but does have a history of chronic ileus.  His stool is brown and Hemoccult negative.  He had a CTA GI bleed protocol of his abdomen pelvis which showed a chronic ileus but did not appear to show a very large blood clot in the left lower extremity extending to the IVC.  Radiology called and is  requesting a CTA of the chest at this time since they are concerned that he may have embolized due to the size.  Not currently having any symptoms of a PE and is satting well on room air.  Will obtain ultrasound of his leg as well.  Unfortunately with his history of ICH do not feel he is a good anticoagulant candidate and so will likely require an IVC filter.  Upon re-evaluation patient remained stable.  Signed out to the oncoming physician awaiting further imaging.  This patient presents to the ED for concern of complaints listed in HPI, this involves an extensive number of treatment options, and is a complaint that carries with it a high risk of complications and morbidity. Disposition including potential need for admission considered.   Dispo: Pending remainder of workup  Additional history obtained from Nursing Home/Care Facility Records reviewed Outpatient Clinic Notes The following labs were independently interpreted: CBC and show no acute findings I personally reviewed and interpreted the pt's EKG: see above for interpretation  I have reviewed the patients home medications and made adjustments as needed Social Determinants of health:  Geriatric  Portions of this note were generated with Scientist, clinical (histocompatibility and immunogenetics). Dictation errors  may occur despite best attempts at proofreading.     Final diagnoses:  Ileus (HCC)  Rectal bleeding  Deep vein thrombosis (DVT) of iliac vein of left lower extremity, unspecified chronicity [facility])    ED Discharge Orders     None          Yolande Lamar BROCKS, MD 05/07/24 2208

## 2024-05-07 NOTE — ED Provider Notes (Signed)
  Physical Exam  BP 100/85   Pulse 98   Temp 98.6 F (37 C) (Oral)   Resp 13   SpO2 97%   Physical Exam Neurological:     Mental Status: He is alert.     Comments: Chronic right sided deficits both upper and lower. Contracted.   Patient alert to self.        Procedures  Procedures  ED Course / MDM   Clinical Course as of 05/07/24 2152  [facility] May 07, 2024  1630 Signed out to Dr Lavonia [RP]  1801 ICH - Right parieto-occipital ICH with IVH, etiology: 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   CT head   30cc acute hematoma right parieto-occipital region with small volume intraventricular extension and subarachnoid extension.   Extensive encephalomalacia of the bilateral cerebral cortex  CTA head & neck   Absent left ICA with robust intracranial reconstitution. Small carotid canal suggests occlusion early in life.   [TL]  2058 Needs MRI brain w/wo  [TL]    Clinical Course User Index [RP] Yolande Lamar BROCKS, MD [TL] Simon Lavonia SAILOR, MD   Medical Decision Making Amount and/or Complexity of Data Reviewed Labs: ordered. Radiology: ordered.  Risk Prescription drug management.   Signout: history of ICH August 2024, Right sided weakness. Chronic ileus. Abd distension. Blood in stool. Large DVT. Hemodynamically stable. CTA GI bleed. Did not show GI bleed. Showed chronic ileus. Large DVT to IVC. Rads recommended scanning chest for PE. Ulstrasound legs pending.   TA resume: Chronic embolus in the right middle lobe PE.  Also suspicion for anterior mural thrombus along the right ventricular outflow tract just proximal to the pulmonic valve.   Per vascular surgery Dr. Silver: Recommend Heparin  drip.  Vascular surgery will see the patient in the morning.  Will need transfer to [facility] for further intervention. May perform thrombectomy.   Given history of cerebral hemorrhage, did speak to neurosurgery regarding anticoagulation and heparin .   They stated there is no contraindication to heparin  in the setting of previous ICH as long as there is no ongoing ICH at this point time.  Therefore, did obtain CT scan of patient's head.  There is hyperdense to the left occipital gyri.  Could be acute hemorrhage.  Could be contrast staining versus infarction.   Given this finding, I did speak to neurology.  They looked at the imaging.  Feel like this could be calcification from previous bleed but do recommend MRI brain with and without contrast to further assess this area before starting anticoagulation.  They want the patient transferred from ED to ED for MRI brain with and without contrast.  Once this is resulted, they would like to be notified so they can look at the image and help determine whether or not patient is a candidate for anticoagulation in setting of patient's known clot burden into his IVC as well as suspicion for anterior mural thrombus along the right ventricular outflow tract.  Per my exam, patient is still at his baseline. Chronic right sided deficits. No acute changes per nurse.   Patient transferred to [facility] with Dr. Randol as accepting       Simon Lavonia SAILOR, MD 05/07/24 2153

## 2024-05-08 ENCOUNTER — Inpatient Hospital Stay (HOSPITAL_COMMUNITY)

## 2024-05-08 ENCOUNTER — Other Ambulatory Visit (HOSPITAL_COMMUNITY): Payer: Self-pay

## 2024-05-08 ENCOUNTER — Emergency Department (HOSPITAL_COMMUNITY)

## 2024-05-08 ENCOUNTER — Telehealth (HOSPITAL_COMMUNITY): Payer: Self-pay | Admitting: Pharmacy Technician

## 2024-05-08 DIAGNOSIS — I82432 Acute embolism and thrombosis of left popliteal vein: Secondary | ICD-10-CM | POA: Diagnosis present

## 2024-05-08 DIAGNOSIS — K625 Hemorrhage of anus and rectum: Secondary | ICD-10-CM | POA: Diagnosis not present

## 2024-05-08 DIAGNOSIS — Z8249 Family history of ischemic heart disease and other diseases of the circulatory system: Secondary | ICD-10-CM | POA: Diagnosis not present

## 2024-05-08 DIAGNOSIS — E669 Obesity, unspecified: Secondary | ICD-10-CM | POA: Diagnosis present

## 2024-05-08 DIAGNOSIS — K567 Ileus, unspecified: Secondary | ICD-10-CM | POA: Diagnosis present

## 2024-05-08 DIAGNOSIS — I82412 Acute embolism and thrombosis of left femoral vein: Secondary | ICD-10-CM | POA: Diagnosis present

## 2024-05-08 DIAGNOSIS — Z86718 Personal history of other venous thrombosis and embolism: Secondary | ICD-10-CM | POA: Diagnosis not present

## 2024-05-08 DIAGNOSIS — R008 Other abnormalities of heart beat: Secondary | ICD-10-CM | POA: Diagnosis not present

## 2024-05-08 DIAGNOSIS — I82442 Acute embolism and thrombosis of left tibial vein: Secondary | ICD-10-CM | POA: Diagnosis present

## 2024-05-08 DIAGNOSIS — Z950 Presence of cardiac pacemaker: Secondary | ICD-10-CM | POA: Diagnosis not present

## 2024-05-08 DIAGNOSIS — E538 Deficiency of other specified B group vitamins: Secondary | ICD-10-CM | POA: Diagnosis present

## 2024-05-08 DIAGNOSIS — D6861 Antiphospholipid syndrome: Secondary | ICD-10-CM | POA: Diagnosis present

## 2024-05-08 DIAGNOSIS — I824Y2 Acute embolism and thrombosis of unspecified deep veins of left proximal lower extremity: Secondary | ICD-10-CM

## 2024-05-08 DIAGNOSIS — I69151 Hemiplegia and hemiparesis following nontraumatic intracerebral hemorrhage affecting right dominant side: Secondary | ICD-10-CM | POA: Diagnosis not present

## 2024-05-08 DIAGNOSIS — I2782 Chronic pulmonary embolism: Secondary | ICD-10-CM | POA: Diagnosis present

## 2024-05-08 DIAGNOSIS — Z6824 Body mass index (BMI) 24.0-24.9, adult: Secondary | ICD-10-CM | POA: Diagnosis not present

## 2024-05-08 DIAGNOSIS — G9349 Other encephalopathy: Secondary | ICD-10-CM | POA: Diagnosis present

## 2024-05-08 DIAGNOSIS — L89514 Pressure ulcer of right ankle, stage 4: Secondary | ICD-10-CM | POA: Diagnosis present

## 2024-05-08 DIAGNOSIS — I513 Intracardiac thrombosis, not elsewhere classified: Secondary | ICD-10-CM | POA: Diagnosis present

## 2024-05-08 DIAGNOSIS — E876 Hypokalemia: Secondary | ICD-10-CM | POA: Diagnosis present

## 2024-05-08 DIAGNOSIS — I82422 Acute embolism and thrombosis of left iliac vein: Secondary | ICD-10-CM | POA: Diagnosis present

## 2024-05-08 DIAGNOSIS — F17210 Nicotine dependence, cigarettes, uncomplicated: Secondary | ICD-10-CM | POA: Diagnosis present

## 2024-05-08 DIAGNOSIS — E785 Hyperlipidemia, unspecified: Secondary | ICD-10-CM | POA: Diagnosis present

## 2024-05-08 DIAGNOSIS — I10 Essential (primary) hypertension: Secondary | ICD-10-CM | POA: Diagnosis present

## 2024-05-08 DIAGNOSIS — Z9189 Other specified personal risk factors, not elsewhere classified: Secondary | ICD-10-CM

## 2024-05-08 DIAGNOSIS — E8721 Acute metabolic acidosis: Secondary | ICD-10-CM | POA: Diagnosis present

## 2024-05-08 DIAGNOSIS — Z7189 Other specified counseling: Secondary | ICD-10-CM | POA: Diagnosis not present

## 2024-05-08 DIAGNOSIS — Z515 Encounter for palliative care: Secondary | ICD-10-CM | POA: Diagnosis not present

## 2024-05-08 DIAGNOSIS — I82409 Acute embolism and thrombosis of unspecified deep veins of unspecified lower extremity: Secondary | ICD-10-CM | POA: Diagnosis present

## 2024-05-08 DIAGNOSIS — I69119 Unspecified symptoms and signs involving cognitive functions following nontraumatic intracerebral hemorrhage: Secondary | ICD-10-CM | POA: Diagnosis not present

## 2024-05-08 LAB — MAGNESIUM: Magnesium: 1.7 mg/dL (ref 1.7–2.4)

## 2024-05-08 LAB — ECHOCARDIOGRAM COMPLETE
Height: 66 in
S' Lateral: 2.5 cm
Weight: 2395.08 [oz_av]

## 2024-05-08 LAB — HEPARIN LEVEL (UNFRACTIONATED): Heparin Unfractionated: 0.5 [IU]/mL (ref 0.30–0.70)

## 2024-05-08 LAB — TYPE AND SCREEN
ABO/RH(D): B POS
Antibody Screen: NEGATIVE

## 2024-05-08 MED ORDER — SIMETHICONE 80 MG PO CHEW
80.0000 mg | CHEWABLE_TABLET | Freq: Three times a day (TID) | ORAL | Status: DC
  Administered 2024-05-08 – 2024-05-11 (×11): 80 mg via ORAL
  Filled 2024-05-08 (×11): qty 1

## 2024-05-08 MED ORDER — VITAMIN B-12 1000 MCG PO TABS
1000.0000 ug | ORAL_TABLET | Freq: Every day | ORAL | Status: DC
  Administered 2024-05-08 – 2024-05-11 (×4): 1000 ug via ORAL
  Filled 2024-05-08 (×4): qty 1

## 2024-05-08 MED ORDER — POLYETHYLENE GLYCOL 3350 17 G PO PACK: 17.0000 g | PACK | Freq: Every day | ORAL | Status: DC | PRN

## 2024-05-08 MED ORDER — OXYCODONE HCL 5 MG PO TABS
5.0000 mg | ORAL_TABLET | ORAL | Status: DC | PRN
  Administered 2024-05-09 – 2024-05-11 (×4): 5 mg via ORAL
  Filled 2024-05-08 (×4): qty 1

## 2024-05-08 MED ORDER — SENNOSIDES-DOCUSATE SODIUM 8.6-50 MG PO TABS
1.0000 | ORAL_TABLET | Freq: Every day | ORAL | Status: DC
  Filled 2024-05-08: qty 1

## 2024-05-08 MED ORDER — RISAQUAD PO CAPS: 2.0000 | ORAL_CAPSULE | Freq: Every day | ORAL | Status: DC

## 2024-05-08 MED ORDER — ALBUTEROL SULFATE (2.5 MG/3ML) 0.083% IN NEBU: 2.5000 mg | INHALATION_SOLUTION | RESPIRATORY_TRACT | Status: DC | PRN

## 2024-05-08 MED ORDER — ACETAMINOPHEN 650 MG RE SUPP: 650.0000 mg | Freq: Four times a day (QID) | RECTAL | Status: DC | PRN

## 2024-05-08 MED ORDER — GADOBUTROL 1 MMOL/ML IV SOLN
10.0000 mL | Freq: Once | INTRAVENOUS | Status: AC | PRN
  Administered 2024-05-08: 10 mL via INTRAVENOUS

## 2024-05-08 MED ORDER — PANTOPRAZOLE SODIUM 40 MG PO TBEC
40.0000 mg | DELAYED_RELEASE_TABLET | Freq: Every day | ORAL | Status: DC
  Administered 2024-05-08 – 2024-05-10 (×3): 40 mg via ORAL
  Filled 2024-05-08 (×3): qty 1

## 2024-05-08 MED ORDER — BISACODYL 10 MG RE SUPP: 10.0000 mg | Freq: Every day | RECTAL | Status: DC | PRN

## 2024-05-08 MED ORDER — GABAPENTIN 300 MG PO CAPS
300.0000 mg | ORAL_CAPSULE | Freq: Two times a day (BID) | ORAL | Status: DC
  Administered 2024-05-08 – 2024-05-11 (×7): 300 mg via ORAL
  Filled 2024-05-08 (×7): qty 1

## 2024-05-08 MED ORDER — ACETAMINOPHEN 325 MG PO TABS: 650.0000 mg | ORAL_TABLET | Freq: Four times a day (QID) | ORAL | Status: DC | PRN

## 2024-05-08 MED ORDER — POTASSIUM CHLORIDE IN NACL 20-0.9 MEQ/L-% IV SOLN
INTRAVENOUS | Status: AC
  Filled 2024-05-08 (×2): qty 1000

## 2024-05-08 MED ORDER — HEPARIN (PORCINE) 25000 UT/250ML-% IV SOLN
750.0000 [IU]/h | INTRAVENOUS | Status: DC
  Administered 2024-05-08: 1000 [IU]/h via INTRAVENOUS
  Filled 2024-05-08: qty 250

## 2024-05-08 MED ORDER — PERFLUTREN LIPID MICROSPHERE
1.0000 mL | INTRAVENOUS | Status: AC | PRN
  Administered 2024-05-08: 5 mL via INTRAVENOUS

## 2024-05-08 NOTE — ED Notes (Signed)
 CCMD contacted to admit the patient for cardiac monitoring.

## 2024-05-08 NOTE — Consult Note (Signed)
 Consultation Note Date: 05/08/2024   Patient Name: Daniel Stuart  DOB: [birth year]  MRN: [id number]  Age / Sex: 58 y.o., male  PCP: Paseda, Daniel R, [name] Referring Physician: [place], Daniel J, DO  Reason for Consultation: Establishing goals of care  HPI/Patient Profile: 58 y.o. male  with past medical history of ICH with residual right sided weakness (bedbound at baseline), aggressive leukoencephalopathy, PAD/left ICA stenosis, cortical blindness, HTN, HLD, antiphospholipid syndrome, persistent colonic ileus, s/p PPM admitted on 05/07/2024 from [facility] with abd distention and blood in stool - no active bleeding identified off of anticoagulation since 2024 due to ICH.   Clinical Assessment and Goals of Care: Consult received and extensive chart review completed. Noted concern now for extensive LLE DVT extending to IVC and intramural thrombus to RV outflow tract as well as chronic RML PE. Recently discharged 6/18 due to ileus and pneumonia. Main concern now for anticoagulation use since this previously lead to ICH and now with significant clot burden. No plans for mechanical thrombectomy due to chronic clot and low symptom burden - vascular surgery recommends transition to DOAC. However, neurology has previously recommended to remain off anticoagulation long term in the setting of cerebral amyloid angiopathy with high risk of recurrent bleeding/ICH.   I met today with Daniel Stuart and no family/visitors to bedside. He is awake and alert. He is unable to recall any conversation from the medical team about what is going on. When asked where he was living before coming to the hospital he tells me he had an apartment (notes indicate he has been living at [facility]). He is able to tell me that his sister, Daniel Stuart, is his main help and support. He agrees that I can call and speak with her.   I tried to explain simply to  Daniel Stuart that he has blood clots as well as risk of bleeding as evidenced by his previous ICH. We discussed the complicated situation of risks of clotting and bleeding and difficult to treat him without risks. I asked him what he thinks about this and he replies - seems complicated. Not sure that Daniel Stuart has good understanding. His responses are delayed and he has very poor recollection.   I called and left HIPAA compliant voicemail for sister Daniel Stuart. I discussed with my attending as well. Recommendations to consider hematology consult to assist with guidance for anticoagulation recommendations in complicated patient. Will wait to speak with sister further regarding GOC and expectations. I assisted him with ginger ale and turning television to [facility] - his preference. He denies further needs.    Primary Decision Maker NEXT OF KIN sister Daniel Stuart    SUMMARY OF RECOMMENDATIONS   - Will need further conversation with sister Daniel Stuart  Code Status/Advance Care Planning: Full code - need to discuss further   Symptom Management:  Per attending and consultant teams  Prognosis:  Overall prognosis poor with significant risk for fatal bleeding or clotting events.   Discharge Planning: To Be Determined      Primary Diagnoses: Present  on Admission:  Acute DVT (deep venous thrombosis) (HCC)   I have reviewed the medical record, interviewed the patient and family, and examined the patient. The following aspects are pertinent.  Past Medical History:  Diagnosis Date   Anxiety    Arrhythmia as indication for cardiac pacemaker replacement 03/2020   Frequent falls 03/2020   History of loop recorder 04/2017   ICH (intracerebral hemorrhage) (HCC) 06/19/2023   Stroke (cerebrum) (HCC)    Tobacco use    Urinary frequency 09/2019   Vitamin D  deficiency    Social History   Socioeconomic History   Marital status: Legally Separated    Spouse name: Not on file   Number of children: 1   Years of  education: Not on file   Highest education level: Some college, no degree  Occupational History   Occupation: disabled  Tobacco Use   Smoking status: Every Day    Current packs/day: 1.00    Average packs/day: 1 pack/day for 46.5 years (46.5 ttl pk-yrs)    Types: Cigarettes    Start date: 11/01/1977   Smokeless tobacco: Never  Vaping Use   Vaping status: Never Used  Substance and Sexual Activity   Alcohol use: No   Drug use: No   Sexual activity: Not Currently    Partners: Female  Other Topics Concern   Not on file  Social History Narrative   Patient is right-handed. He lives in a one level home alone  since. He drinks one cup of coffee and 2-3 sodas a day.   Social Drivers of Health   Financial Resource Strain: Medium Risk (07/31/2021)   Overall Financial Resource Strain (CARDIA)    Difficulty of Paying Living Expenses: Somewhat hard  Food Insecurity: No Food Insecurity (04/13/2024)   Hunger Vital Sign    Worried About Running Out of Food in the Last Year: Never true    Ran Out of Food in the Last Year: Never true  Transportation Needs: No Transportation Needs (04/13/2024)   PRAPARE - Administrator, Civil Service (Medical): No    Lack of Transportation (Non-Medical): No  Physical Activity: Inactive (07/31/2021)   Exercise Vital Sign    Days of Exercise per Week: 0 days    Minutes of Exercise per Session: 0 min  Stress: No Stress Concern Present (07/31/2021)   [employer] of Occupational Health - Occupational Stress Questionnaire    Feeling of Stress : Not at all  Social Connections: Socially Isolated (07/31/2021)   Social Connection and Isolation Panel    Frequency of Communication with Friends and Family: Three times a week    Frequency of Social Gatherings with Friends and Family: Never    Attends Religious Services: Never    Database administrator or Organizations: No    Attends Engineer, structural: Never    Marital Status: Separated   Family  History  Problem Relation Age of Onset   Diabetes Mother    COPD Mother    Diabetes Father    Heart failure Father    Cancer Sister    Scheduled Meds:  acidophilus  2 capsule Oral Daily   cyanocobalamin   1,000 mcg Oral Daily   gabapentin   300 mg Oral BID   pantoprazole   40 mg Oral QHS   senna-docusate  1 tablet Oral QHS   simethicone   80 mg Oral TID AC & HS   Continuous Infusions:  0.9 % NaCl with KCl 20 mEq / L 75 mL/hr at  05/08/24 0922   heparin  1,000 Units/hr (05/08/24 0828)   PRN Meds:.acetaminophen  **OR** acetaminophen , albuterol , bisacodyl , oxyCODONE , polyethylene glycol No Known Allergies Review of Systems  Unable to perform ROS: Other  Constitutional:        Poor memory/recall    Physical Exam Vitals and nursing note reviewed.  Constitutional:      General: He is awake. He is not in acute distress. Cardiovascular:     Rate and Rhythm: Normal rate.  Pulmonary:     Effort: No tachypnea, accessory muscle usage or respiratory distress.  Abdominal:     Palpations: Abdomen is soft.  Neurological:     Mental Status: He is alert.     Comments: Oriented to person; poor memory/recall; poor insight and understanding; delayed responses  Psychiatric:     Comments: Calm     Vital Signs: BP 109/70   Pulse 100   Temp 98 F (36.7 C) (Oral)   Resp 14   Ht 5' 6 (1.676 m)   Wt 67.9 kg Comment: wt from 04/13/2024  SpO2 100%   BMI 24.16 kg/m  Pain Scale: 0-10   Pain Score: 0-No pain   SpO2: SpO2: 100 % O2 Device:SpO2: 100 % O2 Flow Rate: .   IO: Intake/output summary: No intake or output data in the 24 hours ending 05/08/24 1011  LBM:   Baseline Weight: Weight: 67.9 kg (wt from 04/13/2024) Most recent weight: Weight: 67.9 kg (wt from 04/13/2024)     Palliative Assessment/Data:    Time Total: 75 min  Greater than 50%  of this time was spent counseling and coordinating care related to the above assessment and plan.  Signed by: [name], NP Palliative  Medicine Team Pager # [phone] (M-F 8a-5p) Team Phone # [phone] (Nights/Weekends)

## 2024-05-08 NOTE — Progress Notes (Addendum)
 ANTICOAGULATION CONSULT NOTE  Pharmacy Consult for Heparin  Indication: VTE  No Known Allergies  Patient Measurements: Height: 5' 6 (167.6 cm) Weight: 67.9 kg (149 lb 11.1 oz) (wt from 04/13/2024) IBW/kg (Calculated) : 63.8 Heparin  Dosing Weight: 67.9 kg  Vital Signs: Temp: 98.7 F (37.1 C) (07/08 1708) Temp Source: Oral (07/08 1708) BP: 107/78 (07/08 1700) Pulse Rate: 69 (07/08 1700)  Labs: Recent Labs    05/07/24 1308 05/08/24 1622  HGB 13.9  --   HCT 43.1  --   PLT 188  --   HEPARINUNFRC  --  0.50  CREATININE 1.10  --     Estimated Creatinine Clearance: 64.4 mL/min (by C-G formula based on SCr of 1.1 mg/dL).   Medical History: Past Medical History:  Diagnosis Date   Anxiety    Arrhythmia as indication for cardiac pacemaker replacement 03/2020   Frequent falls 03/2020   History of loop recorder 04/2017   ICH (intracerebral hemorrhage) (HCC) 06/19/2023   Stroke (cerebrum) (HCC)    Tobacco use    Urinary frequency 09/2019   Vitamin D  deficiency     Medications:  (Not in a hospital admission)  Scheduled:   cyanocobalamin   1,000 mcg Oral Daily   gabapentin   300 mg Oral BID   pantoprazole   40 mg Oral QHS   senna-docusate  1 tablet Oral QHS   simethicone   80 mg Oral TID AC & HS   Infusions:   0.9 % NaCl with KCl 20 mEq / L 75 mL/hr at 05/08/24 9077   heparin  1,000 Units/hr (05/08/24 0828)   PRN: acetaminophen  **OR** acetaminophen , albuterol , bisacodyl , oxyCODONE , polyethylene glycol  Assessment: 58 yoM presented to ED with abdominal distention and reports of blood in stool (stool has been brown, hemoccult negative). CTa shows very large blood clot in LLE extending to IVC, anterior mural thrombus, and suspected chronic embolus in the right middle lobe pulmonary arterial branches. Pharmacy consulted to dose heparin .   -Patient history includes: ICH in 06/2023 and has been off AC since then and has history of antiphospholipid syndrome -Head CT showing  development of hyperdensity conforming to the left occipital gyri (chronic and to be expected; neurosurgery believe to be calcification from previous ICH) -MRI shows remote right parietal and occipital hematoma   Patient was not on anticoagulation prior to arrival. Started on 1000 units/hr IV heparin  without an initial bolus. Resulting heparin  level is 0.5 which is borderline therapeutic.  No issues with infusion or bleeding per RN.  Hgb 13.9; plt 188  Goal of Therapy:  Heparin  level 0.3-0.5 units/ml Monitor platelets by anticoagulation protocol: Yes   Plan:  Given patient's bleeding risk. Prefer to not keep at high end of goal range Will slightly decrease heparin  infusion to 950 units/hr Check anti-Xa level at 0000 and daily while on heparin  Continue to monitor H&H and platelets  Dorn Buttner, PharmD, BCPS 05/08/2024 5:11 PM ED Clinical Pharmacist -  [phone]

## 2024-05-08 NOTE — Progress Notes (Addendum)
 PHARMACY - ANTICOAGULATION CONSULT NOTE  Pharmacy Consult for heparin  Indication: VTE  No Known Allergies  Patient Measurements: Height: 5' 6 (167.6 cm) Weight: 67.9 kg (149 lb 11.1 oz) (wt from 04/13/2024) IBW/kg (Calculated) : 63.8 HEPARIN  DW (KG): 67.9  Vital Signs: Temp: 98.1 F (36.7 C) (07/08 0538) Temp Source: Oral (07/08 0538) BP: 110/71 (07/08 0600) Pulse Rate: 89 (07/08 0600)  Labs: Recent Labs    05/07/24 1308  HGB 13.9  HCT 43.1  PLT 188  CREATININE 1.10    Estimated Creatinine Clearance: 64.4 mL/min (by C-G formula based on SCr of 1.1 mg/dL).  Medical History: Past Medical History:  Diagnosis Date   Anxiety    Arrhythmia as indication for cardiac pacemaker replacement 03/2020   Frequent falls 03/2020   History of loop recorder 04/2017   ICH (intracerebral hemorrhage) (HCC) 06/19/2023   Stroke (cerebrum) (HCC)    Tobacco use    Urinary frequency 09/2019   Vitamin D  deficiency    Assessment: 22 yoM presented to ED with abdominal distention and reports of blood in stool (stool has been brown, hemoccult negative). CTa shows very large blood clot in LLE extending to IVC, anterior mural thrombus, and suspected chronic embolus in the right middle lobe pulmonary arterial branches. Pharmacy consulted to dose heparin .  -Patient history includes: ICH in 06/2023 and has been off AC since then and has history of antiphospholipid syndrome -Head CT showing development of hyperdensity conforming to the left occipital gyri (chronic and to be expected; neurosurgery believe to be calcification from previous ICH) -MRI shows remote right parietal and occipital hematoma   -CBC stable -no PTA AC -per discussion with ED provider, will do no bolus, low goal  Goal of Therapy:  Heparin  level 0.3-0.5 units/ml Monitor platelets by anticoagulation protocol: Yes   Plan:  No bolus given history of ICH and parietal and occipital hematoma Start heparin  infusion at 1000  units/hr Check anti-Xa level in 6 hours and daily while on heparin  Continue to monitor H&H and platelets Will need thrombectomy and possible filter given bleed history Lynwood Poplar, PharmD, BCPS Clinical Pharmacist 05/08/2024 6:30 AM

## 2024-05-08 NOTE — Telephone Encounter (Signed)
 Patient Product/process development scientist completed.    The patient is insured through [employer]. Patient has Medicare and is not eligible for a copay card, but may be able to apply for patient assistance or Medicare RX Payment Plan (Patient Must reach out to their plan, if eligible for payment plan), if available.    Ran test claim for Eliquis 5 mg and the current 30 day co-pay is $0.00.  Ran test claim for Xarelto 20 mg and the current 30 day co-pay is $0.00.  This test claim was processed through [facility]- copay amounts may vary at other pharmacies due to pharmacy/plan contracts, or as the patient moves through the different stages of their insurance plan.     Roland Earl, CPHT Pharmacy Technician III Certified Patient Advocate [facility] Patient Advocate Team Direct Number: [phone]  Fax: [phone]

## 2024-05-08 NOTE — Progress Notes (Signed)
 Patient arrived to unit AAOx4. No c/o pain or SOB. Dual skin check with Rankin RN: DTI/unstagable to right lateral ankle (foam in place), MASD to coccyx (foam applied) and dry flaky skin to scalp and face.LLE cold and painful but with palpable pulse. Heparin  gtt running as ordered. Patient positioned onto left side. Bed alarm on and call bell in reach.

## 2024-05-08 NOTE — H&P (Addendum)
 History and Physical    Patient: Daniel Stuart FMW:[id number] DOB: Jun 23, 1966 DOA: 05/07/2024 DOS: the patient was seen and examined on 05/08/2024 PCP: Paseda, Folashade R, [name]  Patient coming from: SNF  Chief Complaint: Abdominal distention Chief Complaint  Patient presents with   Rectal Bleeding   HPI: Daniel Stuart is a 58 y.o. male with medical history significant of ICH with residual right sided weakness (bedbound at baseline), aggressive leukoencephalopathy, PAD/left ICA stenosis, cortical blindness, HTN, HLD, antiphospholipid syndrome, persistent colonic ileus, s/p PPM who presented to [facility] ED from [facility] via EMS with concerns for abdominal distention, concern for blood in stool.  Patient although with no complaints, no pain in his legs.  Although difficult to interpret history given his known history of encephalopathy.  In the ED, temperature 98.1 F, HR 88, RR 12, BP 107/92, SpO2 100% on room air.  WBC 9.1, hemoglobin 13.9, platelet count 188.  Sodium 135, potassium four 3.0, chloride 104, CO2 19, glucose 98, BUN 13, creat 1.10.  AST 16, ALT 16, total bilirubin 0.7.  FOBT negative.  CT head without contrast with interval development hyperdensity conforming to the left occipital gyri could be related to an acute hemorrhage, differential includes contrast staining versus infarction with blood-brain barrier breakdown, noted marked bilateral cerebral encephalomalacia.  MRI brain without and with contrast with no acute intracranial abnormality, remote right parietal occipital hematoma and cortical laminar necrosis, chronic confluent periventricular and subcortical white matter T2 hyperintensities similar to prior exam, chronic diffuse atrophy with proportional ventricles.  CT angiogram GI bleed study with no evidence of active GI bleed, large DVT extending from left lower extremity veins into the IVC, significant (prior CT, no colonic ileus with no evidence small bowel obstruction,  multiple nonobstructive bilateral renal calculi measuring up to 6 mm, without hydronephrosis or obstructing stone, right middle lobe atelectasis.  CT angiogram chest negative for PE with chronic embolus right middle lobe, questionable anterior mural thrombus right ventricular outflow track proximal to pulmonic valve.  EDP is discussed with neurology, Dr. Merrianne  who reviewed patient's imaging; and okay to start heparin  drip.  Vascular surgery was consulted.  Patient was transferred ED to ED for potential need of mechanical thrombectomy.  TRH consulted for admission.   Review of Systems: As mentioned in the history of present illness. All other systems reviewed and are negative. Past Medical History:  Diagnosis Date   Anxiety    Arrhythmia as indication for cardiac pacemaker replacement 03/2020   Frequent falls 03/2020   History of loop recorder 04/2017   ICH (intracerebral hemorrhage) (HCC) 06/19/2023   Stroke (cerebrum) (HCC)    Tobacco use    Urinary frequency 09/2019   Vitamin D  deficiency    Past Surgical History:  Procedure Laterality Date   FOOT SURGERY     LOOP RECORDER INSERTION N/A 04/13/2017   Procedure: Loop Recorder Insertion;  Surgeon: Waddell Danelle ORN, MD;  Location: [facility] INVASIVE CV LAB;  Service: Cardiovascular;  Laterality: N/A;   TEE WITHOUT CARDIOVERSION N/A 04/13/2017   Procedure: TRANSESOPHAGEAL ECHOCARDIOGRAM (TEE);  Surgeon: Jeffrie Oneil BROCKS, MD;  [facility];  Service: Cardiovascular;  Laterality: N/A;   Social History:  reports that he has been smoking cigarettes. He started smoking about 46 years ago. He has a 46.5 pack-year smoking history. He has never used smokeless tobacco. He reports that he does not drink alcohol and does not use drugs.  No Known Allergies  Family History  Problem Relation Age of Onset  Diabetes Mother    COPD Mother    Diabetes Father    Heart failure Father    Cancer Sister     Prior to Admission medications   Medication  Sig Start Date End Date Taking? Authorizing Provider  acetaminophen  (TYLENOL ) 325 MG tablet Take 2 tablets (650 mg total) by mouth every 6 (six) hours as needed for mild pain, moderate pain, fever or headache (or temp > 37.5 C (99.5 F)). 06/22/23   Hongalgi, Anand D, MD  acidophilus (RISAQUAD) CAPS capsule Take 2 capsules by mouth daily. 04/18/24   Rai, Nydia POUR, MD  bisacodyl  (DULCOLAX) 10 MG suppository Place 1 suppository (10 mg total) rectally daily as needed for mild constipation. 04/18/24   Rai, Ripudeep POUR, MD  cyanocobalamin  1000 MCG tablet Take 1 tablet (1,000 mcg total) by mouth daily. 09/11/22   Antoinette Doe, MD  feeding supplement (ENSURE ENLIVE / ENSURE PLUS) LIQD Take 237 mLs by mouth 2 (two) times daily between meals. Patient taking differently: Take 237 mLs by mouth 3 (three) times daily between meals. Chocolate if available 03/02/24   Tobie Yetta HERO, MD  gabapentin  (NEURONTIN ) 300 MG capsule TAKE 1 CAPSULE BY MOUTH BY MOUTH TWO TIMES DAILY Patient taking differently: Take 300 mg by mouth 2 (two) times daily. 04/22/22 04/14/24  Skeet Juliene SAUNDERS, DO  ipratropium-albuterol  (DUONEB) 0.5-2.5 (3) MG/3ML SOLN Take 3 mLs by nebulization every 6 (six) hours as needed (SOB, wheezing).    [provider]  nutrition supplement, JUVEN, (JUVEN) PACK Take 1 packet by mouth 2 (two) times daily between meals. 03/09/24   Patsy Lenis, MD  ondansetron  (ZOFRAN ) 4 MG tablet Take 1 tablet (4 mg total) by mouth every 6 (six) hours as needed for nausea. 04/18/24   Rai, Nydia POUR, MD  pantoprazole  (PROTONIX ) 40 MG tablet Take 1 tablet (40 mg total) by mouth at bedtime. 06/22/23   Hongalgi, Anand D, MD  polyethylene glycol (MIRALAX  / GLYCOLAX ) 17 g packet Take 17 g by mouth 2 (two) times daily. 03/09/24   Patsy Lenis, MD  potassium chloride  (KLOR-CON ) 20 MEQ packet Take 40 mEq by mouth 2 (two) times daily. 04/18/24   Rai, Ripudeep K, MD  senna-docusate (SENOKOT-S) 8.6-50 MG tablet Take 1 tablet by mouth at  bedtime. 04/18/24   Rai, Nydia POUR, MD  simethicone  (MYLICON) 80 MG chewable tablet Chew 1 tablet (80 mg total) by mouth 4 (four) times daily -  before meals and at bedtime. Patient taking differently: Chew 80 mg by mouth 4 (four) times daily. 03/02/24   Tobie Yetta HERO, MD  sodium phosphate (FLEET) ENEM Place 1 enema rectally every other day.    [provider]  vitamin D3 (CHOLECALCIFEROL ) 25 MCG tablet Take 1 tablet (1,000 Units total) by mouth daily. 04/18/24 05/27/24  Davia Nydia POUR, MD    Physical Exam: Vitals:   05/08/24 0500 05/08/24 0530 05/08/24 0538 05/08/24 0600  BP: 97/74 97/76  110/71  Pulse: 90 89  89  Resp: 20 17  12   Temp:   98.1 F (36.7 C)   TempSrc:   Oral   SpO2: 99% 99%  99%  Weight:    67.9 kg  Height:    5' 6 (1.676 m)   Physical Exam GEN: 58 yo male in NAD, alert and oriented x  2 ([facility], [facility]), but does not know year, chronically ill in appearance HEENT: NCAT, PERRL, EOMI, sclera clear, dry mucous membranes PULM: CTAB w/o wheezes/crackles, normal respiratory effort, on room air  CV: RRR w/o M/G/R GI: abd soft, NTND, NABS, no R/G/M MSK: Trace bilateral lower extremity peripheral edema, severe right lower extremity contracture, right-sided weakness NEURO: Noted right-sided weakness, RLE severe contracture, otherwise no other acute focal neurological deficit PSYCH: Flat affect Integumentary: No concerning rashes/lesions/wounds none exposed skin surfaces   Assessment and Plan:  Extensive left lower extremity DVT extending to IVC Concern for intramural thrombus to RV outflow tract Chronic right middle lobe pulmonary embolism Patient presenting with abdominal distention, concern for GI bleed and ileus versus bowel obstruction given his previous history.  Underwent CT angiogram GI bleeding study that was positive for a large DVT that was extensive to his left lower extremity into the IVC.  CT angiogram chest notable for chronic right middle lobe pulmonary  embolism as well as potential intramural thrombus to RV outflow tract.  MRI brain with no acute findings, reviewed by neurology, Dr. Merrianne who stated okay to start anticoagulation with heparin  drip.  Transferred to [facility] for evaluation by vascular surgery for potential need of mechanical thrombectomy.  Seen by vascular surgery 7/8, recommend conservative management with heparin  drip for 24 hours and transition to DOAC given his bedbound status, severe cognitive defects chronic weakness/contractures. -- Echocardiogram: Pending -- Continue heparin  drip x 24 hours, then consider transition to DOAC -- Given patient's history of intracranial hemorrhage while on antiplatelets/anticoagulants which were discontinued in 2024, will consult palliative care as overall prognosis for this patient given his comorbidities poor.  Concern for GI bleed, ruled out Staff at SNF reports dark stools in the setting of abdominal distention.  Currently not on anticoagulation outpatient which was discontinued in 2024 as below.  FOBT negative.  CT angiogram GI bleed study with no active bleeding.  Hemoglobin stable 13.9.  Hypokalemia Potassium 3.0, repleted. -- Continue IV fluids with potassium supplementation x 1 day -- Repeat electrolytes in a.m. to include magnesium   Ileus, chronic Patient with known history of chronic ileus for many years, complicated by his bedbound status with progressive leukoencephalopathy.  Follows with [facility] gastroenterology outpatient.  Patient denies any abdominal pain, no nausea/vomiting. -- Soft diet -- Bowel regimen  History of ICH  Hx CVA/TIA Progressive leukoencephalopathy History of antiphospholipid syndrome Previously was on aspirin , Eliquis , Plavix  which all have been discontinued due to development of intracranial hemorrhage in 2024.  Bedbound at baseline.  Residing at [facility]. -- Started on heparin  drip as above   HTN Currently not on medication  outpatient  Prolonged Qtc Avoid QT prolonging medications.    Advance Care Planning:   Code Status: Full Code   Consults: Vascular surgery, palliative care  Family Communication: No family present at bedside this morning  Severity of Illness: The appropriate patient status for this patient is INPATIENT. Inpatient status is judged to be reasonable and necessary in order to provide the required intensity of service to ensure the patient's safety. The patient's presenting symptoms, physical exam findings, and initial radiographic and laboratory data in the context of their chronic comorbidities is felt to place them at high risk for further clinical deterioration. Furthermore, it is not anticipated that the patient will be medically stable for discharge from the hospital within 2 midnights of admission.   * I certify that at the point of admission it is my clinical judgment that the patient will require inpatient hospital care spanning beyond 2 midnights from the point of admission due to high intensity of service, high risk for further deterioration and high frequency of surveillance required.*  Author: Camellia PARAS [place], DO 05/08/2024 7:46 AM  For on call review [email or web address].

## 2024-05-08 NOTE — Consult Note (Addendum)
 Hospital Consult    Reason for Consult: Extensive left lower extremity DVT extending into the IVC Requesting Physician: Hospital medicine MRN #:  [id number]  History of Present Illness: This is a 58 y.o. male with history outlined below presenting to [facility] as a transfer [facility] with extensive deep venous thrombosis extending to the level of the renal veins in the inferior vena cava.  This extends down through the left iliac system and into the left leg.  On exam, Daniel Stuart was resting comfortably.  He is bedbound at baseline due to previous intracranial bleed in 2024.  He has a contracture of the right knee to greater than 90 degrees.  He was able to answer questions with yes and no, described no pain in the left lower extremity, no significant edema, stated the left leg felt fine.    Past Medical History:  Diagnosis Date   Anxiety    Arrhythmia as indication for cardiac pacemaker replacement 03/2020   Frequent falls 03/2020   History of loop recorder 04/2017   ICH (intracerebral hemorrhage) (HCC) 06/19/2023   Stroke (cerebrum) (HCC)    Tobacco use    Urinary frequency 09/2019   Vitamin D  deficiency     Past Surgical History:  Procedure Laterality Date   FOOT SURGERY     LOOP RECORDER INSERTION N/A 04/13/2017   Procedure: Loop Recorder Insertion;  Surgeon: Waddell Danelle ORN, MD;  Location: [facility] INVASIVE CV LAB;  Service: Cardiovascular;  Laterality: N/A;   TEE WITHOUT CARDIOVERSION N/A 04/13/2017   Procedure: TRANSESOPHAGEAL ECHOCARDIOGRAM (TEE);  Surgeon: Jeffrie Oneil BROCKS, MD;  Location: [facility] ENDOSCOPY;  Service: Cardiovascular;  Laterality: N/A;    No Known Allergies  Prior to Admission medications   Medication Sig Start Date End Date Taking? Authorizing Provider  acetaminophen  (TYLENOL ) 325 MG tablet Take 2 tablets (650 mg total) by mouth every 6 (six) hours as needed for mild pain, moderate pain, fever or headache (or temp > 37.5 C (99.5 F)). 06/22/23   Hongalgi, Anand  D, MD  acidophilus (RISAQUAD) CAPS capsule Take 2 capsules by mouth daily. 04/18/24   Rai, Nydia POUR, MD  bisacodyl  (DULCOLAX) 10 MG suppository Place 1 suppository (10 mg total) rectally daily as needed for mild constipation. 04/18/24   Rai, Nydia POUR, MD  cyanocobalamin  1000 MCG tablet Take 1 tablet (1,000 mcg total) by mouth daily. 09/11/22   Antoinette Doe, MD  feeding supplement (ENSURE ENLIVE / ENSURE PLUS) LIQD Take 237 mLs by mouth 2 (two) times daily between meals. Patient taking differently: Take 237 mLs by mouth 3 (three) times daily between meals. Chocolate if available 03/02/24   Tobie Yetta HERO, MD  gabapentin  (NEURONTIN ) 300 MG capsule TAKE 1 CAPSULE BY MOUTH BY MOUTH TWO TIMES DAILY Patient taking differently: Take 300 mg by mouth 2 (two) times daily. 04/22/22 04/14/24  Skeet Juliene SAUNDERS, DO  ipratropium-albuterol  (DUONEB) 0.5-2.5 (3) MG/3ML SOLN Take 3 mLs by nebulization every 6 (six) hours as needed (SOB, wheezing).    [provider]  nutrition supplement, JUVEN, (JUVEN) PACK Take 1 packet by mouth 2 (two) times daily between meals. 03/09/24   Patsy Lenis, MD  ondansetron  (ZOFRAN ) 4 MG tablet Take 1 tablet (4 mg total) by mouth every 6 (six) hours as needed for nausea. 04/18/24   Rai, Nydia POUR, MD  pantoprazole  (PROTONIX ) 40 MG tablet Take 1 tablet (40 mg total) by mouth at bedtime. 06/22/23   Hongalgi, Anand D, MD  polyethylene glycol (MIRALAX  / GLYCOLAX ) 17  g packet Take 17 g by mouth 2 (two) times daily. 03/09/24   Patsy Lenis, MD  potassium chloride  (KLOR-CON ) 20 MEQ packet Take 40 mEq by mouth 2 (two) times daily. 04/18/24   Rai, Ripudeep K, MD  senna-docusate (SENOKOT-S) 8.6-50 MG tablet Take 1 tablet by mouth at bedtime. 04/18/24   Rai, Nydia POUR, MD  simethicone  (MYLICON) 80 MG chewable tablet Chew 1 tablet (80 mg total) by mouth 4 (four) times daily -  before meals and at bedtime. Patient taking differently: Chew 80 mg by mouth 4 (four) times daily. 03/02/24   Tobie Yetta HERO, MD  sodium phosphate (FLEET) ENEM Place 1 enema rectally every other day.    [provider]  vitamin D3 (CHOLECALCIFEROL ) 25 MCG tablet Take 1 tablet (1,000 Units total) by mouth daily. 04/18/24 05/27/24  Davia Nydia POUR, MD    Social History   Socioeconomic History   Marital status: Legally Separated    Spouse name: Not on file   Number of children: 1   Years of education: Not on file   Highest education level: Some college, no degree  Occupational History   Occupation: disabled  Tobacco Use   Smoking status: Every Day    Current packs/day: 1.00    Average packs/day: 1 pack/day for 46.5 years (46.5 ttl pk-yrs)    Types: Cigarettes    Start date: 11/01/1977   Smokeless tobacco: Never  Vaping Use   Vaping status: Never Used  Substance and Sexual Activity   Alcohol use: No   Drug use: No   Sexual activity: Not Currently    Partners: Female  Other Topics Concern   Not on file  Social History Narrative   Patient is right-handed. He lives in a one level home alone  since. He drinks one cup of coffee and 2-3 sodas a day.   Social Drivers of Health   Financial Resource Strain: Medium Risk (07/31/2021)   Overall Financial Resource Strain (CARDIA)    Difficulty of Paying Living Expenses: Somewhat hard  Food Insecurity: No Food Insecurity (04/13/2024)   Hunger Vital Sign    Worried About Running Out of Food in the Last Year: Never true    Ran Out of Food in the Last Year: Never true  Transportation Needs: No Transportation Needs (04/13/2024)   PRAPARE - Administrator, Civil Service (Medical): No    Lack of Transportation (Non-Medical): No  Physical Activity: Inactive (07/31/2021)   Exercise Vital Sign    Days of Exercise per Week: 0 days    Minutes of Exercise per Session: 0 min  Stress: No Stress Concern Present (07/31/2021)   [employer] of Occupational Health - Occupational Stress Questionnaire    Feeling of Stress : Not at all  Social  Connections: Socially Isolated (07/31/2021)   Social Connection and Isolation Panel    Frequency of Communication with Friends and Family: Three times a week    Frequency of Social Gatherings with Friends and Family: Never    Attends Religious Services: Never    Database administrator or Organizations: No    Attends Banker Meetings: Never    Marital Status: Separated  Intimate Partner Violence: Not At Risk (04/13/2024)   Humiliation, Afraid, Rape, and Kick questionnaire    Fear of Current or Ex-Partner: No    Emotionally Abused: No    Physically Abused: No    Sexually Abused: No   Family History  Problem Relation Age of Onset  Diabetes Mother    COPD Mother    Diabetes Father    Heart failure Father    Cancer Sister     ROS: Otherwise negative unless mentioned in HPI  Physical Examination  Vitals:   05/08/24 0538 05/08/24 0600  BP:  110/71  Pulse:  89  Resp:  12  Temp: 98.1 F (36.7 C)   SpO2:  99%   Body mass index is 24.16 kg/m.  General:  WDWN in NAD, cognitive deficit Gait: Not observed HENT: WNL, normocephalic Pulmonary: normal non-labored breathing,  Cardiac: regular, Abdomen: soft, NT/ND, no masses Skin: without rashes Vascular Exam/Pulses: 2+ distal pulses.  Right leg with severe contracture greater than 90 degrees no significant edema in the lower extremities Extremities: without ischemic changes, without Gangrene , without cellulitis; without open wounds;  Musculoskeletal: no muscle wasting or atrophy  Neurologic: Alert, delayed, right knee with severe contracture Psychiatric:  The pt has Normal affect, delay Lymph:  Unremarkable  CBC    Component Value Date/Time   WBC 9.1 05/07/2024 1308   RBC 5.19 05/07/2024 1308   HGB 13.9 05/07/2024 1308   HGB 15.3 06/11/2020 1041   HCT 43.1 05/07/2024 1308   HCT 44.9 06/11/2020 1041   PLT 188 05/07/2024 1308   PLT 230 06/11/2020 1041   MCV 83.0 05/07/2024 1308   MCV 93 06/11/2020 1041    MCH 26.8 05/07/2024 1308   MCHC 32.3 05/07/2024 1308   RDW 15.3 05/07/2024 1308   RDW 13.8 06/11/2020 1041   LYMPHSABS 2.4 05/07/2024 1308   LYMPHSABS 1.7 06/11/2020 1041   MONOABS 0.6 05/07/2024 1308   EOSABS 0.1 05/07/2024 1308   EOSABS 0.1 06/11/2020 1041   BASOSABS 0.1 05/07/2024 1308   BASOSABS 0.0 06/11/2020 1041    BMET    Component Value Date/Time   NA 135 05/07/2024 1308   NA 140 01/11/2022 1121   K 3.0 (L) 05/07/2024 1308   CL 104 05/07/2024 1308   CO2 19 (L) 05/07/2024 1308   GLUCOSE 98 05/07/2024 1308   BUN 13 05/07/2024 1308   BUN 5 (L) 01/11/2022 1121   CREATININE 1.10 05/07/2024 1308   CREATININE 1.24 04/18/2017 1012   CALCIUM  8.7 (L) 05/07/2024 1308   GFRNONAA >60 05/07/2024 1308   GFRNONAA 66 04/18/2017 1012   GFRAA 80 06/11/2020 1041   GFRAA 76 04/18/2017 1012    COAGS: Lab Results  Component Value Date   INR 1.2 06/19/2023     ASSESSMENT/PLAN: This is a 58 y.o. male with prior history of spontaneous ICH leaving him with severe cognitive deficits.  No longer ambulatory.  He has chronic right-sided weakness, right leg contracture, chronic ileus. Patient with antiphospholipid-hypercoagulable. No ongoing ICH at this time per MRI.  This clot is acute on chronic and was seen in May on his CTA. I think this explains his lack of symptoms.  On exam, he was asymptomatic.  With his current comorbidities, including right-sided deficits, nonambulatory state, and being that he is asymptomatic from his thrombus, I do not think that there is utility in percutaneous mechanical thrombectomy of his inferior vena cava, left side iliac system.  Recommend heparin  over the next 24 hours.  If no issues with bleeding, would move to DOAC.   Fonda FORBES Rim MD MS [facility] [phone] 05/08/2024  8:02 AM

## 2024-05-09 DIAGNOSIS — K625 Hemorrhage of anus and rectum: Secondary | ICD-10-CM | POA: Diagnosis not present

## 2024-05-09 DIAGNOSIS — I824Y2 Acute embolism and thrombosis of unspecified deep veins of left proximal lower extremity: Secondary | ICD-10-CM | POA: Diagnosis not present

## 2024-05-09 DIAGNOSIS — K567 Ileus, unspecified: Secondary | ICD-10-CM

## 2024-05-09 DIAGNOSIS — I82422 Acute embolism and thrombosis of left iliac vein: Secondary | ICD-10-CM | POA: Diagnosis not present

## 2024-05-09 LAB — BASIC METABOLIC PANEL WITH GFR
Anion gap: 11 (ref 5–15)
Anion gap: 12 (ref 5–15)
BUN: 5 mg/dL — ABNORMAL LOW (ref 6–20)
BUN: 6 mg/dL (ref 6–20)
CO2: 15 mmol/L — ABNORMAL LOW (ref 22–32)
CO2: 17 mmol/L — ABNORMAL LOW (ref 22–32)
Calcium: 8 mg/dL — ABNORMAL LOW (ref 8.9–10.3)
Calcium: 8 mg/dL — ABNORMAL LOW (ref 8.9–10.3)
Chloride: 111 mmol/L (ref 98–111)
Chloride: 111 mmol/L (ref 98–111)
Creatinine, Ser: 1.08 mg/dL (ref 0.61–1.24)
Creatinine, Ser: 1.15 mg/dL (ref 0.61–1.24)
GFR, Estimated: >60 mL/min (ref >60)
GFR, Estimated: >60 mL/min (ref >60)
Glucose, Bld: 89 mg/dL (ref 70–99)
Glucose, Bld: 94 mg/dL (ref 70–99)
Potassium: 2.7 mmol/L — CL (ref 3.5–5.1)
Potassium: 3.2 mmol/L — ABNORMAL LOW (ref 3.5–5.1)
Sodium: 137 mmol/L (ref 135–145)
Sodium: 140 mmol/L (ref 135–145)

## 2024-05-09 LAB — GLUCOSE, CAPILLARY: Glucose-Capillary: 79 mg/dL (ref 70–99)

## 2024-05-09 LAB — CBC
HCT: 37.2 % — ABNORMAL LOW (ref 39.0–52.0)
Hemoglobin: 12.3 g/dL — ABNORMAL LOW (ref 13.0–17.0)
MCH: 26.8 pg (ref 26.0–34.0)
MCHC: 33.1 g/dL (ref 30.0–36.0)
MCV: 81 fL (ref 80.0–100.0)
Platelets: 187 K/uL (ref 150–400)
RBC: 4.59 MIL/uL (ref 4.22–5.81)
RDW: 15.5 % (ref 11.5–15.5)
WBC: 8.6 K/uL (ref 4.0–10.5)
nRBC: 0 % (ref 0.0–0.2)

## 2024-05-09 LAB — HEPARIN LEVEL (UNFRACTIONATED)
Heparin Unfractionated: 0.36 [IU]/mL (ref 0.30–0.70)
Heparin Unfractionated: 0.7 [IU]/mL (ref 0.30–0.70)

## 2024-05-09 LAB — PHOSPHORUS: Phosphorus: 2.8 mg/dL (ref 2.5–4.6)

## 2024-05-09 LAB — MAGNESIUM: Magnesium: 1.6 mg/dL — ABNORMAL LOW (ref 1.7–2.4)

## 2024-05-09 MED ORDER — POLYETHYLENE GLYCOL 3350 17 G PO PACK
17.0000 g | PACK | Freq: Two times a day (BID) | ORAL | Status: DC
  Administered 2024-05-09 – 2024-05-10 (×2): 17 g via ORAL
  Filled 2024-05-09 (×4): qty 1

## 2024-05-09 MED ORDER — APIXABAN 5 MG PO TABS
5.0000 mg | ORAL_TABLET | Freq: Two times a day (BID) | ORAL | Status: DC
  Administered 2024-05-09 – 2024-05-11 (×5): 5 mg via ORAL
  Filled 2024-05-09 (×5): qty 1

## 2024-05-09 MED ORDER — POTASSIUM CHLORIDE 10 MEQ/100ML IV SOLN
10.0000 meq | INTRAVENOUS | Status: AC
  Administered 2024-05-09 (×4): 10 meq via INTRAVENOUS
  Filled 2024-05-09 (×4): qty 100

## 2024-05-09 MED ORDER — POTASSIUM CHLORIDE 20 MEQ PO PACK
40.0000 meq | PACK | Freq: Once | ORAL | Status: AC
  Administered 2024-05-09: 40 meq via ORAL
  Filled 2024-05-09: qty 2

## 2024-05-09 MED ORDER — LACTATED RINGERS IV BOLUS
500.0000 mL | Freq: Once | INTRAVENOUS | Status: AC
  Administered 2024-05-09: 500 mL via INTRAVENOUS

## 2024-05-09 MED ORDER — MAGNESIUM SULFATE 2 GM/50ML IV SOLN
2.0000 g | Freq: Once | INTRAVENOUS | Status: AC
  Administered 2024-05-09: 2 g via INTRAVENOUS
  Filled 2024-05-09: qty 50

## 2024-05-09 MED ORDER — SENNOSIDES-DOCUSATE SODIUM 8.6-50 MG PO TABS
2.0000 | ORAL_TABLET | Freq: Every day | ORAL | Status: DC
  Administered 2024-05-09 – 2024-05-10 (×2): 2 via ORAL
  Filled 2024-05-09 (×2): qty 2

## 2024-05-09 NOTE — Consult Note (Signed)
 WOC Nurse Consult Note: Reason for Consult:R ankle wound  Wound type: stage 4 pressure injury to R ankle- noted patient's leg is stiff and difficult to extend and turn, suspect contraction with constant pressure to wound site. Pressure Injury POA: Yes Measurement: 2 cm x 2 cm x 0.2 cm Wound bed: red, moist Drainage (amount, consistency, odor) serosanguinous, moderate  Periwound: intact Dressing procedure/placement/frequency: Cleanse wound with Vashe ([id number]) allow to air dry, place Aquacel Ag soila #[id number]) over wound and cover with foam dressing.  Change daily. Off load pressure from ankle, utilize pillows beneath the lower leg to lift ankle off bed.   WOC team will not follow at this time, please re-consult if new needs arise.  Thank you,  Doyal Polite, RN, MSN, [facility] WOC Team

## 2024-05-09 NOTE — Progress Notes (Signed)
 PHARMACY - ANTICOAGULATION CONSULT NOTE  Pharmacy Consult for heparin  Indication: VTE  Labs: Recent Labs    05/07/24 1308 05/08/24 1622 05/08/24 2354  HGB 13.9  --  12.3*  HCT 43.1  --  37.2*  PLT 188  --  187  HEPARINUNFRC  --  0.50 0.70  CREATININE 1.10  --  1.08   Assessment: 58yo male supratherapeutic on heparin  with higher level despite decreased rate; no infusion issues or signs of bleeding per RN.  Goal of Therapy:  Heparin  level 0.3-0.5 units/ml   Plan:  Decrease heparin  infusion by 3 units/kg/hr to 750 units/hr. Check level in 6 hours.   Marvetta Dauphin, PharmD, BCPS 05/09/2024 12:38 AM

## 2024-05-09 NOTE — Progress Notes (Signed)
 PROGRESS NOTE        PATIENT DETAILS Name: Daniel Stuart Age: 58 y.o. Sex: male Date of Birth: 02/28/1966 Admit Date: 05/07/2024 Admitting Physician Redia LOISE Cleaver, MD ERE:Ejdzij, Folashade R, [name]  Brief Summary: Patient is a 58 y.o.  male with history of multiple cryptogenic strokes, possible antiphospholipid syndrome-off all anticoagulants since ICH with right-sided weakness (August 2024)-resident of SNF-presented to [facility] ED from SNF with abdominal distention/possible dark stools-CT imaging done to evaluate abdominal distention/dark stools showed DVT in LLE extending to IVC.  Stools were brown and FOBT negative in the ED.  He was started on IV heparin -vascular surgery was consulted and patient was transferred to [facility] for further evaluation and treatment.  Significant events: 7/7>> admit to [facility]-transferred from [facility] to [facility] for vascular surgery evaluation given large clot burden.  Significant studies: 7/7>> CT angio GI bleed: No active GI bleeding seen, large DVT from LLE to IVC.  Colonic ileus. 7/7>> CTA chest: Suspected chronic embolus right middle lobe pulmonary artery.  Suspicion for anterior moral thrombus along the right ventricular outflow tract. 7/7>> B/L lower extremity Doppler: DVT involving left common femoral/SFA junction, age-indeterminate DVT in left femoral/left proximal profunda, left popliteal/, left posterior tibial. 7/8>> MRI brain: No acute intracranial abnormality. 7/8>> TTE: EF 65-70%.  Significant microbiology data: None  Procedures: None  Consults: [facility] Palliative care.  Subjective: Lying comfortably in bed-denies any chest pain or shortness of breath.  Objective: Vitals: Blood pressure 100/68, pulse 75, temperature 97.6 F (36.4 C), temperature source Oral, resp. rate 13, height 5' 6 (1.676 m), weight 67.9 kg, SpO2 93%.   Exam: Gen Exam:Alert awake-not in any distress HEENT:atraumatic, normocephalic Chest:  B/L clear to auscultation anteriorly CVS:S1S2 regular Abdomen:soft non tender, non distended Extremities:no edema Neurology: Right-sided hemiparesis-RLE weaker than RUE.   Skin: no rash  Pertinent Labs/Radiology:    Latest Ref Rng & Units 05/08/2024   11:54 PM 05/07/2024    1:08 PM 04/18/2024    3:17 AM  CBC  WBC 4.0 - 10.5 K/uL 8.6  9.1  8.2   Hemoglobin 13.0 - 17.0 g/dL 87.6  86.0  87.8   Hematocrit 39.0 - 52.0 % 37.2  43.1  38.4   Platelets 150 - 400 K/uL 187  188  207     Lab Results  Component Value Date   NA 137 05/09/2024   K 2.7 (LL) 05/09/2024   CL 111 05/09/2024   CO2 15 (L) 05/09/2024     Assessment/Plan: Extensive LLE DVT with extension to IVC-with pulmonary embolism and possible intramural RV thrombus History of possible antiphospholipid syndrome-(off Eliquis  since August 2021 secondary to ICH) Difficult situation-has history of possible antiphospholipid antibody syndrome-numerous prior cryptogenic ischemic strokes-previously on anticoagulation but this was discontinued following ICH August 2024.  His current extensive VTE is likely multifactorial-from underlying antiphospholipid syndrome and bedbound status.  He seems to have tolerated IV heparin  overnight (note admitting MD had discussed with neurology on-call-okay to start IV heparin ) Per vascular surgery-not a candidate for thrombectomy Since tolerated IV heparin -we can transition to Eliquis  today-and watch him overnight.  Hypomagnesemia/hypokalemia Continue to replete/recheck.  Chronic ileus Mild abdominal distention-no vomiting Maintain on bowel regimen Supportive care  History of numerous cryptogenic strokes-possible positive antiphospholipid syndrome antibodies. See above-now on IV heparin  MRI brain negative for acute abnormalities Appears to have chronic right-sided hemiparesis at baseline.  Per prior notes-bedbound and SNF resident.  History of ICH August 2024 See above-seems to have tolerated  anticoagulation well.  History of possible vascular dementia Progressive leukoencephalopathy Likely sequelae of numerous CVA/ICH Supportive care  Vitamin B12 deficiency Continue cyanocobalamin  Mild obesity anemia recheck repeat B12 levels at discretion  History of neuropathy Continue gabapentin .  Code status:   Code Status: Full Code   DVT Prophylaxis:IV Heparin >>eliquis   Family Communication: Sister-[phone]-left voicemail on 7/9   Disposition Plan: Status is: Inpatient Remains inpatient appropriate because: Severity of illness   Planned Discharge Destination:Skilled nursing facility   Diet: Diet Order             DIET SOFT Room service appropriate? Yes; Fluid consistency: Thin  Diet effective now                     Antimicrobial agents: Anti-infectives (From admission, onward)    None        MEDICATIONS: Scheduled Meds:  cyanocobalamin   1,000 mcg Oral Daily   gabapentin   300 mg Oral BID   pantoprazole   40 mg Oral QHS   senna-docusate  1 tablet Oral QHS   simethicone   80 mg Oral TID AC & HS   Continuous Infusions:  heparin  750 Units/hr (05/09/24 0041)   magnesium  sulfate bolus IVPB     potassium chloride      PRN Meds:.acetaminophen  **OR** acetaminophen , albuterol , bisacodyl , oxyCODONE , polyethylene glycol   I have personally reviewed following labs and imaging studies  LABORATORY DATA: CBC: Recent Labs  Lab 05/07/24 1308 05/08/24 2354  WBC 9.1 8.6  NEUTROABS 6.0  --   HGB 13.9 12.3*  HCT 43.1 37.2*  MCV 83.0 81.0  PLT 188 187    Basic Metabolic Panel: Recent Labs  Lab 05/07/24 1308 05/08/24 0129 05/08/24 2354 05/09/24 0747  NA 135  --  140 137  K 3.0*  --  3.2* 2.7*  CL 104  --  111 111  CO2 19*  --  17* 15*  GLUCOSE 98  --  94 89  BUN 13  --  6 5*  CREATININE 1.10  --  1.08 1.15  CALCIUM  8.7*  --  8.0* 8.0*  MG 1.8 1.7 1.6*  --   PHOS  --   --  2.8  --     GFR: Estimated Creatinine Clearance: 61.6  mL/min (by C-G formula based on SCr of 1.15 mg/dL).  Liver Function Tests: Recent Labs  Lab 05/07/24 1308  AST 16  ALT 16  ALKPHOS 141*  BILITOT 0.7  PROT 7.9  ALBUMIN 3.3*   No results for input(s): LIPASE, AMYLASE in the last 168 hours. No results for input(s): AMMONIA in the last 168 hours.  Coagulation Profile: No results for input(s): INR, PROTIME in the last 168 hours.  Cardiac Enzymes: No results for input(s): CKTOTAL, CKMB, CKMBINDEX, TROPONINI in the last 168 hours.  BNP (last 3 results) No results for input(s): PROBNP in the last 8760 hours.  Lipid Profile: No results for input(s): CHOL, HDL, LDLCALC, TRIG, CHOLHDL, LDLDIRECT in the last 72 hours.  Thyroid  Function Tests: No results for input(s): TSH, T4TOTAL, FREET4, T3FREE, THYROIDAB in the last 72 hours.  Anemia Panel: No results for input(s): VITAMINB12, FOLATE, FERRITIN, TIBC, IRON, RETICCTPCT in the last 72 hours.  Urine analysis:    Component Value Date/Time   COLORURINE STRAW (A) 04/13/2024 1613   APPEARANCEUR CLEAR 04/13/2024 1613   LABSPEC 1.031 (H) 04/13/2024 1613   PHURINE 6.0 04/13/2024 1613  GLUCOSEU NEGATIVE 04/13/2024 1613   HGBUR NEGATIVE 04/13/2024 1613   BILIRUBINUR NEGATIVE 04/13/2024 1613   BILIRUBINUR negative 07/10/2021 1202   BILIRUBINUR neg 06/11/2020 0901   KETONESUR NEGATIVE 04/13/2024 1613   PROTEINUR NEGATIVE 04/13/2024 1613   UROBILINOGEN 0.2 07/10/2021 1202   UROBILINOGEN 0.2 11/30/2017 1030   NITRITE NEGATIVE 04/13/2024 1613   LEUKOCYTESUR NEGATIVE 04/13/2024 1613    Sepsis Labs: Lactic Acid, Venous    Component Value Date/Time   LATICACIDVEN 1.0 04/13/2024 2337    MICROBIOLOGY: No results found for this or any previous visit (from the past 240 hours).  RADIOLOGY STUDIES/RESULTS: ECHOCARDIOGRAM COMPLETE Result Date: 05/08/2024    ECHOCARDIOGRAM REPORT   Patient Name:   Daniel Stuart Date of Exam:  05/08/2024 Medical Rec #:  [id number]         Height:       66.0 in Accession #:    [id number]        Weight:       149.7 lb Date of Birth:  [birth year]         BSA:          1.768 m Patient Age:    58 years          BP:           103/79 mmHg Patient Gender: M                 HR:           100 bpm. Exam Location:  Inpatient Procedure: 2D Echo, Cardiac Doppler, Color Doppler and Intracardiac            Opacification Agent (Both Spectral and Color Flow Doppler were            utilized during procedure). Indications:    other abnormalities of the heart  History:        Patient has prior history of Echocardiogram examinations, most                 recent 06/19/2023.  Sonographer:    Therisa Crouch Referring Phys: [id number] ERIC J [place] IMPRESSIONS  1. Left ventricular ejection fraction, by estimation, is 65 to 70%. The left ventricle has normal function. Left ventricular endocardial border not optimally defined to evaluate regional wall motion. Left ventricular diastolic parameters are indeterminate.  2. Right ventricular systolic function is normal. The right ventricular size is not well visualized.  3. The mitral valve is grossly normal. No evidence of mitral valve regurgitation.  4. The aortic valve was not well visualized. There is mild calcification of the aortic valve. Aortic valve regurgitation is not visualized. No aortic stenosis is present. Comparison(s): Prior images reviewed side by side. Both studies are technically difficult; this study is more complete than prior. FINDINGS  Left Ventricle: Left ventricular ejection fraction, by estimation, is 65 to 70%. The left ventricle has normal function. Left ventricular endocardial border not optimally defined to evaluate regional wall motion. The left ventricular internal cavity size was normal in size. There is no left ventricular hypertrophy. Left ventricular diastolic parameters are indeterminate. Right Ventricle: The right ventricular size is not well visualized.  Right vetricular wall thickness was not well visualized. Right ventricular systolic function is normal. Left Atrium: Left atrial size was not well visualized. Right Atrium: Right atrial size was not well visualized. Pericardium: There is no evidence of pericardial effusion. Mitral Valve: The mitral valve is grossly normal. No evidence of mitral valve regurgitation. Tricuspid Valve: The tricuspid  valve is not well visualized. Tricuspid valve regurgitation is not demonstrated. Aortic Valve: The aortic valve was not well visualized. There is mild calcification of the aortic valve. Aortic valve regurgitation is not visualized. No aortic stenosis is present. Pulmonic Valve: The pulmonic valve was normal in structure. Pulmonic valve regurgitation is not visualized. No evidence of pulmonic stenosis. Aorta: The aortic root is normal in size and structure. IAS/Shunts: The interatrial septum was not well visualized.  LEFT VENTRICLE PLAX 2D LVIDd:         3.20 cm LVIDs:         2.50 cm LV PW:         0.60 cm LV IVS:        0.70 cm  LEFT ATRIUM         Index LA diam:    2.10 cm 1.19 cm/m   AORTA Ao Root diam: 2.80 cm Ao Asc diam:  2.80 cm Stanly Leavens MD Electronically signed by Stanly Leavens MD Signature Date/Time: 05/08/2024/4:04:25 PM    Final    VAS US  LOWER EXTREMITY VENOUS (DVT) (ONLY [facility] & [facility]) Result Date: 05/08/2024  Lower Venous DVT Study Patient Name:  Daniel Stuart  Date of Exam:   05/07/2024 Medical Rec #: [id number]          Accession #:    [id number] Date of Birth: 1966/05/02          Patient Gender: M Patient Age:   49 years Exam Location:  [facility] Procedure:      VAS US  LOWER EXTREMITY VENOUS (DVT) Referring Phys: LAMAR PATERSON --------------------------------------------------------------------------------  Indications: Extensive DVT noted in the left lower extremity and IVC by CT.  Risk Factors: Possible antiphospholipid syndrome. Immobility (bed bound at nursing facility).  Limitations: Right leg contraction, patient moving leg away from probe, significant abdominal distention and hardness,. Comparison Study: No prior LEV on file Performing Technologist: Alberta Lis RVS  Examination Guidelines: A complete evaluation includes B-mode imaging, spectral Doppler, color Doppler, and power Doppler as needed of all accessible portions of each vessel. Bilateral testing is considered an integral part of a complete examination. Limited examinations for reoccurring indications may be performed as noted. The reflux portion of the exam is performed with the patient in reverse Trendelenburg.  +---------+---------------+---------+-----------+----------+-------------------+ RIGHT    CompressibilityPhasicitySpontaneityPropertiesThrombus Aging      +---------+---------------+---------+-----------+----------+-------------------+ CFV      Full           Yes      Yes                                      +---------+---------------+---------+-----------+----------+-------------------+ SFJ      Full                                                             +---------+---------------+---------+-----------+----------+-------------------+ FV Prox  Full           Yes      Yes                                      +---------+---------------+---------+-----------+----------+-------------------+ FV Mid   Full  Yes      Yes                                      +---------+---------------+---------+-----------+----------+-------------------+ FV DistalFull           Yes      Yes                                      +---------+---------------+---------+-----------+----------+-------------------+ PFV      Full                                                             +---------+---------------+---------+-----------+----------+-------------------+ POP      Full           Yes      Yes                                       +---------+---------------+---------+-----------+----------+-------------------+ PTV                                                   Not well visualized +---------+---------------+---------+-----------+----------+-------------------+ PERO                                                  Not well visualized +---------+---------------+---------+-----------+----------+-------------------+   +---------+---------------+---------+-----------+----------+-------------------+ LEFT     CompressibilityPhasicitySpontaneityPropertiesThrombus Aging      +---------+---------------+---------+-----------+----------+-------------------+ CFV      None           Yes      Yes                  Subacute            +---------+---------------+---------+-----------+----------+-------------------+ SFJ      Partial        Yes      Yes                  Subacute            +---------+---------------+---------+-----------+----------+-------------------+ FV Prox  None           No       No                   Age Indeterminate   +---------+---------------+---------+-----------+----------+-------------------+ FV Mid   None           No       No                   Age Indeterminate   +---------+---------------+---------+-----------+----------+-------------------+ FV DistalNone           No       No                   Age Indeterminate   +---------+---------------+---------+-----------+----------+-------------------+ PFV  None           No       No                   Age Indeterminate   +---------+---------------+---------+-----------+----------+-------------------+ POP      None           No       No                   Age Indeterminate   +---------+---------------+---------+-----------+----------+-------------------+ PTV      None                                         Age Indeterminate   +---------+---------------+---------+-----------+----------+-------------------+  PERO                                                  Not well visualized +---------+---------------+---------+-----------+----------+-------------------+ EIV                                                   appears patent in                                                         this limited exam   +---------+---------------+---------+-----------+----------+-------------------+ Unable to visualize iliacs or IVC secondary to abdominal distention and hardness.    Summary: RIGHT: - There is no evidence of deep vein thrombosis in the lower extremity. However, portions of this examination were limited- see technologist comments above.  - Ultrasound characteristics of enlarged lymph nodes are noted in the groin.  LEFT: - Findings consistent with subacute deep vein thrombosis involving the left common femoral vein, and SF junction.  - Findings consistent with age indeterminate deep vein thrombosis involving the left femoral vein, left proximal profunda vein, left popliteal vein, and left posterior tibial veins.  - Ultrasound characteristics of enlarged lymph nodes noted in the groin.  *See table(s) above for measurements and observations. Electronically signed by Penne Colorado MD on 05/08/2024 at 3:53:22 PM.    Final    MR Brain W and Wo Contrast Result Date: 05/08/2024 EXAM: MRI BRAIN WITH AND WITHOUT CONTRAST 05/08/2024 04:26:00 AM TECHNIQUE: Multiplanar multisequence MRI of the head/brain was performed with and without the administration of intravenous contrast (10mL gadobutrol  (GADAVIST ) 1 MMOL/ML). COMPARISON: CT head without contrast 05/07/2024. MR head without and with contrast 06/20/2023. MR head without contrast 09/08/2022. CLINICAL HISTORY: Eval lesion seen on CT scan. FINDINGS: BRAIN AND VENTRICLES: Remote right parietal and occipital hematoma is present. Cortical laminar necrosis is present. No acute hemorrhage or underlying mass lesion is present. Chronic confluent periventricular and  subcortical white matter T2 hyperintensity is similar to the prior exam. Chronic diffuse atrophy is present. The ventricles are proportional to the degree of atrophy. ORBITS: No acute abnormality. SINUSES: Minimal mucosal thickening is present along the inferior maxillary sinuses bilaterally. BONES AND SOFT TISSUES: Normal bone marrow signal and enhancement.  No acute soft tissue abnormality. LIMITATIONS/ARTIFACTS: This study is mildly degraded by patient motion. IMPRESSION: 1. No acute intracranial abnormality. 2. Remote right parietal and occipital hematoma and cortical laminar necrosis. The CT findings are chronic and expected. 3. Chronic confluent periventricular and subcortical white matter T2 hyperintensity, similar to prior exam. 4. Chronic diffuse atrophy with proportional ventricles. Electronically signed by: Lonni Necessary MD 05/08/2024 04:42 AM EDT RP Workstation: [id number]   CT Head Wo Contrast Addendum Date: 05/07/2024 ADDENDUM REPORT: 05/07/2024 20:40 ADDENDUM: These results were called by telephone at the time of interpretation on 05/07/2024 at 8:40 pm to provider [facility] , who verbally acknowledged these results. Electronically Signed   By: Morgane  Naveau M.D.   On: 05/07/2024 20:40   Result Date: 05/07/2024 CLINICAL DATA:  eval for ICH EXAM: CT HEAD WITHOUT CONTRAST TECHNIQUE: Contiguous axial images were obtained from the base of the skull through the vertex without intravenous contrast. RADIATION DOSE REDUCTION: This exam was performed according to the departmental dose-optimization program which includes automated exposure control, adjustment of the mA and/or kV according to patient size and/or use of iterative reconstruction technique. COMPARISON:  CT head 06/19/2023 FINDINGS: Brain: Redemonstration of marked bilateral cerebral encephalomalacia. Interval development of hyperdensity conforming to the left occipital gyri. No mass lesion. No extra-axial collection. No mass effect or  midline shift. No hydrocephalus. Basilar cisterns are patent. Vascular: No hyperdense vessel. Skull: No acute fracture or focal lesion. Sinuses/Orbits: Paranasal sinuses and mastoid air cells are clear. The orbits are unremarkable. Other: None. IMPRESSION: 1. Interval development of hyperdensity conforming to the left occipital gyri. Finding could be related to an acute hemorrhage. Differential diagnosis includes contrast staining versus infarction with blood brain barrier breakdown. Recommend MRI without contrast for further evaluation. 2. Redemonstration of marked bilateral cerebral encephalomalacia. Electronically Signed: By: Morgane  Naveau M.D. On: 05/07/2024 20:36   CT Angio Chest PE W and/or Wo Contrast Result Date: 05/07/2024 CLINICAL DATA:  Abdominal imaging revealed large DVT involving the IVC, left common iliac, left external iliac, and left common femoral vein. EXAM: CT ANGIOGRAPHY CHEST WITH CONTRAST TECHNIQUE: Multidetector CT imaging of the chest was performed using the standard protocol during bolus administration of intravenous contrast. Multiplanar CT image reconstructions and MIPs were obtained to evaluate the vascular anatomy. RADIATION DOSE REDUCTION: This exam was performed according to the departmental dose-optimization program which includes automated exposure control, adjustment of the mA and/or kV according to patient size and/or use of iterative reconstruction technique. CONTRAST:  75mL OMNIPAQUE  IOHEXOL  350 MG/ML SOLN COMPARISON:  CT abdomen/pelvis from 05/07/2024 FINDINGS: Cardiovascular: Suspected chronic embolus in the right middle lobe pulmonary arterial branches, with very small caliber feeders potentially reflecting mild recanalization chronic embolus for example on images 151-163 of series 10. The right middle lobe demonstrates chronic volume loss and chronic airspace opacity. Suspicion for anterior mural thrombus along the right ventricular outflow tract just proximal to the  pulmonic valve, for example on image 101 series 8, consider echocardiographic confirmation. Mediastinum/Nodes: Small type 1 hiatal hernia. Lungs/Pleura: Emphysema. Chronic volume loss and consolidation in the right middle lobe. Mild dependent atelectasis in both lower lobes. Hazy ground-glass density in the lungs, cannot exclude mild edema or alveolitis. No pleural effusion. Upper Abdomen: Prominently dilated upper abdominal large bowel as shown on CT abdomen. Musculoskeletal: Unremarkable Review of the MIP images confirms the above findings. IMPRESSION: 1. Suspected chronic embolus in the right middle lobe pulmonary arterial branches, with very small caliber feeders potentially reflecting mild recanalization of chronic embolus.  This is not a characteristic appearance for acute pulmonary embolus. The right middle lobe demonstrates chronic volume loss and chronic airspace opacity. 2. Suspicion for anterior mural thrombus along the right ventricular outflow tract just proximal to the pulmonic valve. Echocardiography could be utilized for confirmation. 3. Hazy ground-glass density in the lungs, cannot exclude mild edema or alveolitis. 4. Prominently dilated upper abdominal large bowel as shown on CT abdomen. 5. Small type 1 hiatal hernia. 6.  Emphysema (ICD10-J43.9). Electronically Signed   By: Ryan Salvage M.D.   On: 05/07/2024 17:24   CT ANGIO GI BLEED Addendum Date: 05/07/2024 ADDENDUM REPORT: 05/07/2024 15:16 ADDENDUM: Chest CT recommended to exclude pulmonary embolism. Electronically Signed   By: Vanetta Chou M.D.   On: 05/07/2024 15:16   Result Date: 05/07/2024 CLINICAL DATA:  Abdominal distension and rectal bleeding. EXAM: CTA ABDOMEN AND PELVIS WITHOUT AND WITH CONTRAST TECHNIQUE: Multidetector CT imaging of the abdomen and pelvis was performed using the standard protocol during bolus administration of intravenous contrast. Multiplanar reconstructed images and MIPs were obtained and reviewed to  evaluate the vascular anatomy. RADIATION DOSE REDUCTION: This exam was performed according to the departmental dose-optimization program which includes automated exposure control, adjustment of the mA and/or kV according to patient size and/or use of iterative reconstruction technique. CONTRAST:  OMNIPAQUE  IOHEXOL  350 MG/ML SOLN COMPARISON:  CT abdomen pelvis dated 04/13/2024. FINDINGS: VASCULAR Aorta: Mild calcified and noncalcified plaque of the abdominal aorta. No aneurysmal dilatation or dissection. No periaortic fluid collection. Celiac: Patent without evidence of aneurysm, dissection, vasculitis or significant stenosis. SMA: Patent without evidence of aneurysm, dissection, vasculitis or significant stenosis. Renals: Both renal arteries are patent without evidence of aneurysm, dissection, vasculitis, fibromuscular dysplasia or significant stenosis. IMA: Patent without evidence of aneurysm, dissection, vasculitis or significant stenosis. Inflow: Mild atherosclerotic calcification. No aneurysmal dilatation or dissection. The iliac arteries are patent. Proximal Outflow: The visualized proximal outflow is patent. Veins: Large clot extends from the visualized left femoral and deep femoral vein with proximal extension to the IVC at the level of the renal vein. This has significantly progressed since the prior CT. The SMV, splenic vein, and main portal vein are patent. No portal venous gas. Review of the MIP images confirms the above findings. NON-VASCULAR Lower chest: Diffuse chronic interstitial coarsening. Right middle lobe atelectasis. Pneumonia is not excluded. No intra-abdominal free air or free fluid. Hepatobiliary: The liver is unremarkable. No biliary dilatation. The gallbladder is unremarkable. Pancreas: Unremarkable. No pancreatic ductal dilatation or surrounding inflammatory changes. Spleen: Normal in size without focal abnormality. Adrenals/Urinary Tract: The adrenal glands unremarkable. Multiple  nonobstructing bilateral renal calculi measure up to 6 mm. No hydronephrosis or obstructing stone. The visualized ureters and urinary bladder appear unremarkable. Stomach/Bowel: Mild thickened appearance of the rectum may represent proctitis. Clinical correlation recommended. There is loose stool throughout the colon consistent with diarrheal state. Retained oral contrast noted in the colon. Diffuse colonic dilatation similar or slightly progressed since the prior CT consistent with colonic ileus. No evidence of small-bowel obstruction. The appendix is normal. Lymphatic: No adenopathy. Reproductive: The prostate and seminal vesicles are grossly unremarkable. Other: None Musculoskeletal: No acute osseous pathology. IMPRESSION: 1. No evidence of active GI bleed. 2. Large DVT extends from the left lower extremity veins into the IVC. This has significantly progressed since the prior CT. 3. Colonic ileus. No evidence of small-bowel obstruction. Normal appendix. 4. Multiple nonobstructing bilateral renal calculi measure up to 6 mm. No hydronephrosis or obstructing stone. 5. Right middle  lobe atelectasis. Pneumonia is not excluded. These results were called by telephone at the time of interpretation on 05/07/2024 at 3:08 pm to provider [facility] , who verbally acknowledged these results. Electronically Signed: By: Vanetta Chou M.D. On: 05/07/2024 15:09     LOS: 1 day   Donalda Applebaum, MD  [facility]    To contact the attending provider between 7A-7P or the covering provider during after hours 7P-7A, please log into the web site [email or web address] and access using universal [place] password for that web site. If you do not have the password, please call the hospital operator.  05/09/2024, 9:25 AM

## 2024-05-09 NOTE — Progress Notes (Signed)

## 2024-05-09 NOTE — NC FL2 (Signed)
 [employer]  MEDICAID [id number] LEVEL OF CARE FORM     IDENTIFICATION  Patient Name: Daniel Stuart Birthdate: Jan 26, 1966 Sex: male Admission Date (Current Location): 05/07/2024  [facility] and [place] Number:  Producer, television/film/video and Address:  [facility], [address], [place], [place] [address]      Provider Number: [id number]  Attending Physician Name and Address:  Daniel Donalda HERO, MD  Relative Name and Phone Number:       Current Level of Care: Hospital Recommended Level of Care: Skilled Nursing Facility Prior Approval Number:    Date Approved/Denied:   PASRR Number: [id number]  Discharge Plan: SNF    Current Diagnoses: Patient Active Problem List   Diagnosis Date Noted   Acute DVT (deep venous thrombosis) (HCC) 05/08/2024   airspace disease 04/13/2024   Prolonged QT interval 04/13/2024   Ileus (HCC) 03/05/2024   Edema of right upper arm 02/28/2024   Protein calorie malnutrition (HCC) 02/27/2024   Pressure injury of skin 02/24/2024   Colonic pseudoobstruction 02/23/2024   Colitis 02/23/2024   Hypokalemia 02/23/2024   History of intracranial hemorrhage 02/23/2024   Essential hypertension 02/23/2024   Antiphospholipid syndrome (HCC) 02/23/2024   Left carotid artery occlusion 09/09/2022   Weakness of both lower extremities 09/07/2022   Bedbound 08/25/2022   PAD (peripheral artery disease) (HCC) 08/25/2022   Arrhythmia as indication for cardiac pacemaker replacement 03/11/2020   Vitamin D  deficiency 09/13/2019   History of intracranial hemorrhage History of severe encephalopathy History of CVA/TIA/progressive leukoencephalopathy and chronic left ICA stenosis 03/12/2019   Right sided weakness 03/12/2019   Anxiety 03/12/2019   PVD (peripheral vascular disease) (HCC) 04/12/2017   Carotid arterial disease (HCC) 04/12/2017    Orientation RESPIRATION BLADDER Height & Weight     Self, Place  Normal Incontinent Weight: 149 lb 11.1 oz (67.9  kg) (wt from 04/13/2024) Height:  5' 6 (167.6 cm)  BEHAVIORAL SYMPTOMS/MOOD NEUROLOGICAL BOWEL NUTRITION STATUS      Incontinent Diet (See DC Summary)  AMBULATORY STATUS COMMUNICATION OF NEEDS Skin   Total Care Verbally Other (Comment) (Pressure injury on right ankle; irritant contact dermatitis perineum bilateral)                       Personal Care Assistance Level of Assistance  Bathing, Feeding, Dressing Bathing Assistance: Maximum assistance Feeding assistance: Limited assistance Dressing Assistance: Maximum assistance     Functional Limitations Info  Sight, Hearing, Speech Sight Info: Impaired Hearing Info: Adequate Speech Info: Adequate    SPECIAL CARE FACTORS FREQUENCY                       Contractures Contractures Info: Not present    Additional Factors Info  Code Status, Allergies Code Status Info: Full Allergies Info: No known allergies           Current Medications (05/09/2024):  This is the current hospital active medication list Current Facility-Administered Medications  Medication Dose Route Frequency Provider Last Rate Last Admin   acetaminophen  (TYLENOL ) tablet 650 mg  650 mg Oral Q6H PRN Stuart, Daniel J, DO       Or   acetaminophen  (TYLENOL ) suppository 650 mg  650 mg Rectal Q6H PRN Stuart, Daniel J, DO       albuterol  (PROVENTIL ) (2.5 MG/3ML) 0.083% nebulizer solution 2.5 mg  2.5 mg Nebulization Q4H PRN Stuart, Daniel J, DO       bisacodyl  (DULCOLAX) suppository 10 mg  10 mg Rectal Daily PRN Stuart, Daniel PARAS, DO       cyanocobalamin  (VITAMIN B12) tablet 1,000 mcg  1,000 mcg Oral Daily Stuart, Daniel J, DO   1,000 mcg at 05/09/24 9066   gabapentin  (NEURONTIN ) capsule 300 mg  300 mg Oral BID Stuart, Daniel J, DO   300 mg at 05/09/24 9066   oxyCODONE  (Oxy IR/ROXICODONE ) immediate release tablet 5 mg  5 mg Oral Q4H PRN Stuart, Daniel J, DO       pantoprazole  (PROTONIX ) EC tablet 40 mg  40 mg Oral QHS Stuart, Daniel J, DO   40 mg at 05/08/24 2210    polyethylene glycol (MIRALAX  / GLYCOLAX ) packet 17 g  17 g Oral BID Daniel Donalda HERO, MD       potassium chloride  10 mEq in 100 mL IVPB  10 mEq Intravenous Q1 Hr x 4 Stuart, Daniel M, MD 100 mL/hr at 05/09/24 1148 10 mEq at 05/09/24 1148   senna-docusate (Senokot-S) tablet 2 tablet  2 tablet Oral QHS Stuart, Donalda HERO, MD       simethicone  (MYLICON) chewable tablet 80 mg  80 mg Oral TID AC & HS Stuart, Daniel J, DO   80 mg at 05/09/24 9066     Discharge Medications: Please see discharge summary for a list of discharge medications.  Relevant Imaging Results:  Relevant Lab Results:   Additional Information SSN: [id number]  Daniel LITTIE Moose, LCSW

## 2024-05-09 NOTE — Progress Notes (Signed)
 Palliative:  HPI: 58 y.o. male  with past medical history of ICH with residual right sided weakness (bedbound at baseline), aggressive leukoencephalopathy, PAD/left ICA stenosis, cortical blindness, HTN, HLD, antiphospholipid syndrome, persistent colonic ileus, s/p PPM admitted on 05/07/2024 from [facility] with abd distention and blood in stool - no active bleeding identified off of anticoagulation since 2024 due to ICH.   I discussed with Dr. Raenelle. I met today with Daniel Stuart. He has just finished receiving bath and NT still at bedside. He has ginger ale at his bedside and I turned his television to Daniel Stuart as he likes. Daniel Stuart denies any concerns. Complained of right heel pain per NT. Will order boots to float heels and try prevent further skin break down. Daniel Stuart has no questions at this time. I explained that I was unable to reach [facility] yesterday but will try again today. I explained that I will reach out to Jasmine if unable to reach Daniel Stuart - he agrees with plan.   I was able to call and connect with Daniel Stuart. Daniel Stuart shares that she is currently out of town for work which is why she has not been here. She plans to return this evening. She was aware he was in the hospital but thought this was due to ileus. I spent time explaining clot burden and need for blood thinner therapy but that this comes with significant risks for Daniel Stuart and could result in a brain bleed. Unfortunately with and without blood thinners comes with significant risks. Daniel Stuart agrees with blood thinner therapy if this is what the medical team feels is best for Daniel Stuart at this time. I reassured her that we will monitor carefully. This was a lot for her to take in. We agree to follow up and speak further tomorrow after she returns back into town.   Updated Dr. Raenelle to conversation.   All questions/concerns addressed. Emotional support provided.   Exam: Alert, appropriate in conversation but underlying confusion and poor recall/poor  memory. No distress. Breathing regular, unlabored. Abd soft.   Plan: - Notified sister Daniel Stuart of situation - she agrees to proceed with anticoagulation therapy at this time. She will return back in town this evening. We will continue goals of care conversations tomorrow.   45 min  [name], NP Palliative Medicine Team Pager [phone] (Please see [email or web address] for schedule) Team Phone [phone]

## 2024-05-09 NOTE — Progress Notes (Addendum)
 ANTICOAGULATION CONSULT NOTE  Pharmacy Consult for Heparin  to Eliquis   Indication: VTE  No Known Allergies  Patient Measurements: Height: 5' 6 (167.6 cm) Weight: 67.9 kg (149 lb 11.1 oz) (wt from 04/13/2024) IBW/kg (Calculated) : 63.8 Heparin  Dosing Weight: 67.9 kg  Vital Signs: Temp: 97.6 F (36.4 C) (07/09 0822) Temp Source: Oral (07/09 0822) BP: 100/68 (07/09 0822) Pulse Rate: 75 (07/09 0822)  Labs: Recent Labs    05/07/24 1308 05/08/24 1622 05/08/24 2354 05/09/24 0747  HGB 13.9  --  12.3*  --   HCT 43.1  --  37.2*  --   PLT 188  --  187  --   HEPARINUNFRC  --  0.50 0.70 0.36  CREATININE 1.10  --  1.08 1.15    Estimated Creatinine Clearance: 61.6 mL/min (by C-G formula based on SCr of 1.15 mg/dL).   Assessment: 1 yoM presented to ED with abdominal distention and reports of blood in stool (stool has been brown, hemoccult negative). CTa shows very large blood clot in LLE extending to IVC, anterior mural thrombus, and suspected chronic embolus in the right middle lobe pulmonary arterial branches. Pharmacy consulted to dose heparin .   -Patient history includes: ICH in 06/2023 and has been off AC since then and has history of antiphospholipid syndrome -Head CT showing development of hyperdensity conforming to the left occipital gyri (chronic and to be expected; neurosurgery believe to be calcification from previous ICH) -MRI shows remote right parietal and occipital hematoma   Patient was not on anticoagulation prior to arrival.   Heparin  level therapeutic - now transitioning to Eliquis , no load given bleeding history   Goal of Therapy:  Heparin  level 0.3-0.5 units/ml Monitor platelets by anticoagulation protocol: Yes   Plan:  DC heparin  To transition to Eliquis  5 mg po BID  Thank you Olam Monte, PharmD 05/09/2024 9:33 AM

## 2024-05-10 DIAGNOSIS — I824Y2 Acute embolism and thrombosis of unspecified deep veins of left proximal lower extremity: Secondary | ICD-10-CM | POA: Diagnosis not present

## 2024-05-10 DIAGNOSIS — Z7189 Other specified counseling: Secondary | ICD-10-CM | POA: Diagnosis not present

## 2024-05-10 DIAGNOSIS — Z515 Encounter for palliative care: Secondary | ICD-10-CM | POA: Diagnosis not present

## 2024-05-10 DIAGNOSIS — Z9189 Other specified personal risk factors, not elsewhere classified: Secondary | ICD-10-CM | POA: Diagnosis not present

## 2024-05-10 LAB — BASIC METABOLIC PANEL WITH GFR
Anion gap: 13 (ref 5–15)
Anion gap: 10 (ref 5–15)
BUN: <5 mg/dL — ABNORMAL LOW (ref 6–20)
BUN: 5 mg/dL — ABNORMAL LOW (ref 6–20)
CO2: 15 mmol/L — ABNORMAL LOW (ref 22–32)
CO2: 16 mmol/L — ABNORMAL LOW (ref 22–32)
Calcium: 8.1 mg/dL — ABNORMAL LOW (ref 8.9–10.3)
Calcium: 8.2 mg/dL — ABNORMAL LOW (ref 8.9–10.3)
Chloride: 112 mmol/L — ABNORMAL HIGH (ref 98–111)
Chloride: 114 mmol/L — ABNORMAL HIGH (ref 98–111)
Creatinine, Ser: 1.05 mg/dL (ref 0.61–1.24)
Creatinine, Ser: 1.19 mg/dL (ref 0.61–1.24)
GFR, Estimated: >60 mL/min (ref >60)
GFR, Estimated: >60 mL/min (ref >60)
Glucose, Bld: 83 mg/dL (ref 70–99)
Glucose, Bld: 103 mg/dL — ABNORMAL HIGH (ref 70–99)
Potassium: 2.8 mmol/L — ABNORMAL LOW (ref 3.5–5.1)
Potassium: 3.1 mmol/L — ABNORMAL LOW (ref 3.5–5.1)
Sodium: 140 mmol/L (ref 135–145)
Sodium: 140 mmol/L (ref 135–145)

## 2024-05-10 LAB — CBC
HCT: 37.1 % — ABNORMAL LOW (ref 39.0–52.0)
Hemoglobin: 12.4 g/dL — ABNORMAL LOW (ref 13.0–17.0)
MCH: 27.1 pg (ref 26.0–34.0)
MCHC: 33.4 g/dL (ref 30.0–36.0)
MCV: 81 fL (ref 80.0–100.0)
Platelets: 201 K/uL (ref 150–400)
RBC: 4.58 MIL/uL (ref 4.22–5.81)
RDW: 15.3 % (ref 11.5–15.5)
WBC: 6.8 K/uL (ref 4.0–10.5)
nRBC: 0 % (ref 0.0–0.2)

## 2024-05-10 LAB — MAGNESIUM
Magnesium: 1.8 mg/dL (ref 1.7–2.4)
Magnesium: 2.1 mg/dL (ref 1.7–2.4)

## 2024-05-10 MED ORDER — POTASSIUM CHLORIDE 10 MEQ/100ML IV SOLN
10.0000 meq | INTRAVENOUS | Status: AC
  Administered 2024-05-10 (×4): 10 meq via INTRAVENOUS
  Filled 2024-05-10 (×4): qty 100

## 2024-05-10 MED ORDER — SODIUM BICARBONATE 650 MG PO TABS
650.0000 mg | ORAL_TABLET | Freq: Two times a day (BID) | ORAL | Status: DC
  Administered 2024-05-10 – 2024-05-11 (×3): 650 mg via ORAL
  Filled 2024-05-10 (×3): qty 1

## 2024-05-10 MED ORDER — SODIUM BICARBONATE 650 MG PO TABS
650.0000 mg | ORAL_TABLET | Freq: Two times a day (BID) | ORAL | Status: DC
Start: 2024-05-10 — End: 2024-06-21

## 2024-05-10 MED ORDER — POTASSIUM CHLORIDE 10 MEQ/100ML IV SOLN
10.0000 meq | Freq: Once | INTRAVENOUS | Status: AC
  Administered 2024-05-10: 10 meq via INTRAVENOUS
  Filled 2024-05-10: qty 100

## 2024-05-10 MED ORDER — MAGNESIUM SULFATE 2 GM/50ML IV SOLN
2.0000 g | Freq: Once | INTRAVENOUS | Status: AC
  Administered 2024-05-10: 2 g via INTRAVENOUS
  Filled 2024-05-10: qty 50

## 2024-05-10 MED ORDER — POTASSIUM CHLORIDE 10 MEQ/100ML IV SOLN: 10.0000 meq | INTRAVENOUS | Status: DC

## 2024-05-10 MED ORDER — POTASSIUM CHLORIDE 20 MEQ PO PACK
40.0000 meq | PACK | Freq: Once | ORAL | Status: AC
  Administered 2024-05-10: 40 meq via ORAL
  Filled 2024-05-10: qty 2

## 2024-05-10 MED ORDER — MIDODRINE HCL 5 MG PO TABS
2.5000 mg | ORAL_TABLET | Freq: Three times a day (TID) | ORAL | Status: DC
  Administered 2024-05-10 – 2024-05-11 (×3): 2.5 mg via ORAL
  Filled 2024-05-10 (×3): qty 1

## 2024-05-10 MED ORDER — LACTATED RINGERS IV BOLUS
1000.0000 mL | Freq: Once | INTRAVENOUS | Status: AC
  Administered 2024-05-10: 1000 mL via INTRAVENOUS

## 2024-05-10 MED ORDER — APIXABAN 5 MG PO TABS: 5.0000 mg | ORAL_TABLET | Freq: Two times a day (BID) | ORAL | Status: DC

## 2024-05-10 NOTE — Progress Notes (Deleted)
 Ellouise Console, PA-C [address] [place], [place]  [address] Phone: [phone]   Gastroenterology Consultation  Referring Provider:     Paseda, Folashade R, [name] Primary Care Physician:  Paseda, Folashade R, [name] Primary Gastroenterologist:  Ellouise Console, PA-C / *** Reason for Consultation:     ED F/U Abdominal Pain and Chronic Ileus        HPI:   Daniel Stuart is a 58 y.o. y/o male referred for consultation & management  by Paseda, Folashade R, [name].    Patient was hospitalized 05/07/2024 until 05/11/2024 from skilled nursing facility for chronic ileus and Acute DVT.  Patient is a 58 y.o.  male with history of multiple cryptogenic strokes, possible antiphospholipid syndrome-off all anticoagulants since ICH with right-sided weakness (August 2024)-resident of SNF-presented to [facility] ED from SNF with abdominal distention/possible dark stools-CT imaging done to evaluate abdominal distention/dark stools showed DVT in LLE extending to IVC.  Stools were brown and FOBT negative in the ED.  He was started on IV heparin -vascular surgery was consulted and patient was transferred to [facility] for further evaluation and treatment.   Significant events: 7/7>> admit to [facility]-transferred from [facility] to [facility] for vascular surgery evaluation given large clot burden.   Significant studies: 7/7>> CT angio GI bleed: No active GI bleeding seen, large DVT from LLE to IVC.  Colonic ileus. 7/7>> CTA chest: Suspected chronic embolus right middle lobe pulmonary artery.  Suspicion for anterior moral thrombus along the right ventricular outflow tract. 7/7>> B/L lower extremity Doppler: DVT involving left common femoral/SFA junction, age-indeterminate DVT in left femoral/left proximal profunda, left popliteal/, left posterior tibial. 7/8>> MRI brain: No acute intracranial abnormality. 7/8>> TTE: EF 65-70%.  Extensive LLE DVT with extension to IVC-with pulmonary embolism and possible intramural RV  thrombus History of possible antiphospholipid syndrome-(off Eliquis  since August 2021 secondary to ICH) Difficult situation-has history of possible antiphospholipid antibody syndrome-numerous prior cryptogenic ischemic strokes-previously on anticoagulation but this was discontinued following ICH August 2024.  His current extensive VTE is likely multifactorial-from underlying antiphospholipid syndrome and bedbound status.  Admitting MD discussed with neurology-okay to start anticoagulation-subsequently was started on IV heparin -which he tolerated and was subsequently switched to Eliquis .     Hypomagnesemia/hypokalemia Repleted-continues to have mild hypokalemia-this will be repleted prior to discharge-appears to be on chronic KCl supplementation at SNF-this will be continued and will   Chronic ileus Mild abdominal distention-no vomiting Maintain on bowel regimen-appears to have had a BM earlier this morning Supportive care   History of numerous cryptogenic strokes-possible positive antiphospholipid syndrome antibodies. See above-now on IV heparin  MRI brain negative for acute abnormalities Appears to have chronic right-sided hemiparesis at baseline.  Per prior notes-bedbound and SNF resident.   History of ICH August 2024 See above-seems to have tolerated anticoagulation well.   History of possible vascular dementia Progressive leukoencephalopathy Likely sequelae of numerous CVA/ICH Supportive care   Vitamin B12 deficiency Continue cyanocobalamin    History of neuropathy Continue gabapentin .   Mild normal anion gap metabolic acidosis Seems to be a chronic issue dating back to last month Asymptomatic Watch closely-start oral bicarb supplementation-follow-up electrolytes-repeat in 1 week at SNF.  Past Medical History:  Diagnosis Date   Anxiety    Arrhythmia as indication for cardiac pacemaker replacement 03/2020   Frequent falls 03/2020   History of loop recorder 04/2017   ICH  (intracerebral hemorrhage) (HCC) 06/19/2023   Stroke (cerebrum) (HCC)    Tobacco use    Urinary  frequency 09/2019   Vitamin D  deficiency     Past Surgical History:  Procedure Laterality Date   FOOT SURGERY     LOOP RECORDER INSERTION N/A 04/13/2017   Procedure: Loop Recorder Insertion;  Surgeon: Waddell Danelle ORN, MD;  Location: [facility] INVASIVE CV LAB;  Service: Cardiovascular;  Laterality: N/A;   TEE WITHOUT CARDIOVERSION N/A 04/13/2017   Procedure: TRANSESOPHAGEAL ECHOCARDIOGRAM (TEE);  Surgeon: Jeffrie Oneil BROCKS, MD;  Location: [facility] ENDOSCOPY;  Service: Cardiovascular;  Laterality: N/A;    Prior to Admission medications   Medication Sig Start Date End Date Taking? Authorizing Provider  acetaminophen  (TYLENOL ) 325 MG tablet Take 2 tablets (650 mg total) by mouth every 6 (six) hours as needed for mild pain, moderate pain, fever or headache (or temp > 37.5 C (99.5 F)). 06/22/23   Hongalgi, Anand D, MD  acidophilus (RISAQUAD) CAPS capsule Take 2 capsules by mouth daily. 04/18/24   Rai, Ripudeep MARLA, MD  apixaban  (ELIQUIS ) 5 MG TABS tablet Take 1 tablet (5 mg total) by mouth 2 (two) times daily. 05/10/24   Ghimire, Donalda HERO, MD  bisacodyl  (DULCOLAX) 10 MG suppository Place 1 suppository (10 mg total) rectally daily as needed for mild constipation. 04/18/24   Rai, Nydia MARLA, MD  cyanocobalamin  1000 MCG tablet Take 1 tablet (1,000 mcg total) by mouth daily. 09/11/22   Antoinette Doe, MD  feeding supplement (ENSURE ENLIVE / ENSURE PLUS) LIQD Take 237 mLs by mouth 2 (two) times daily between meals. Patient taking differently: Take 237 mLs by mouth 3 (three) times daily between meals. Chocolate if available 03/02/24   Tobie Yetta HERO, MD  gabapentin  (NEURONTIN ) 300 MG capsule TAKE 1 CAPSULE BY MOUTH BY MOUTH TWO TIMES DAILY Patient taking differently: Take 300 mg by mouth 2 (two) times daily. 04/22/22 05/08/24  Skeet Juliene SAUNDERS, DO  ipratropium-albuterol  (DUONEB) 0.5-2.5 (3) MG/3ML SOLN Take 3 mLs by nebulization every  6 (six) hours as needed (SOB, wheezing).    [provider]  ketoconazole (NIZORAL) 2 % shampoo Apply 1 Application topically 2 (two) times a week. Monday and Thursday    [provider]  liver oil-zinc oxide (DESITIN) 40 % ointment Apply 1 Application topically as needed for irritation (With each incontinent episode daily).    [provider]  nutrition supplement, JUVEN, (JUVEN) PACK Take 1 packet by mouth 2 (two) times daily between meals. 03/09/24   Patsy Lenis, MD  ondansetron  (ZOFRAN ) 4 MG tablet Take 1 tablet (4 mg total) by mouth every 6 (six) hours as needed for nausea. 04/18/24   Rai, Nydia MARLA, MD  pantoprazole  (PROTONIX ) 40 MG tablet Take 1 tablet (40 mg total) by mouth at bedtime. 06/22/23   Hongalgi, Anand D, MD  polyethylene glycol (MIRALAX  / GLYCOLAX ) 17 g packet Take 17 g by mouth 2 (two) times daily. 03/09/24   Patsy Lenis, MD  potassium chloride  (KLOR-CON ) 20 MEQ packet Take 40 mEq by mouth 2 (two) times daily. 04/18/24   Rai, Ripudeep K, MD  senna-docusate (SENOKOT-S) 8.6-50 MG tablet Take 1 tablet by mouth at bedtime. 04/18/24   Rai, Nydia MARLA, MD  simethicone  (MYLICON) 80 MG chewable tablet Chew 1 tablet (80 mg total) by mouth 4 (four) times daily -  before meals and at bedtime. Patient taking differently: Chew 80 mg by mouth 4 (four) times daily. 03/02/24   Tobie Yetta HERO, MD  sodium bicarbonate  650 MG tablet Take 1 tablet (650 mg total) by mouth 2 (two) times daily. 05/10/24   Ghimire,  Donalda HERO, MD  sodium phosphate (FLEET) ENEM Place 1 enema rectally every other day.    [provider]  vitamin D3 (CHOLECALCIFEROL ) 25 MCG tablet Take 1 tablet (1,000 Units total) by mouth daily. 04/18/24 05/27/24  Davia Nydia POUR, MD    Family History  Problem Relation Age of Onset   Diabetes Mother    COPD Mother    Diabetes Father    Heart failure Father    Cancer Sister      Social History   Tobacco Use   Smoking status: Every Day    Current  packs/day: 1.00    Average packs/day: 1 pack/day for 46.5 years (46.5 ttl pk-yrs)    Types: Cigarettes    Start date: 11/01/1977   Smokeless tobacco: Never  Vaping Use   Vaping status: Never Used  Substance Use Topics   Alcohol use: No   Drug use: No    Allergies as of 05/11/2024   (No Known Allergies)    Review of Systems:    All systems reviewed and negative except where noted in HPI.   Physical Exam:  There were no vitals taken for this visit. No LMP for male patient.  General:   Alert,  Well-developed, well-nourished, pleasant and cooperative in NAD Lungs:  Respirations even and unlabored.  Clear throughout to auscultation.   No wheezes, crackles, or rhonchi. No acute distress. Heart:  Regular rate and rhythm; no murmurs, clicks, rubs, or gallops. Abdomen:  Normal bowel sounds.  No bruits.  Soft, and non-distended without masses, hepatosplenomegaly or hernias noted.  No Tenderness.  No guarding or rebound tenderness.    Neurologic:  Alert and oriented x3;  grossly normal neurologically. Psych:  Alert and cooperative. Normal mood and affect.  Imaging Studies: ECHOCARDIOGRAM COMPLETE Result Date: 05/08/2024    ECHOCARDIOGRAM REPORT   Patient Name:   Daniel Stuart Date of Exam: 05/08/2024 Medical Rec #:  [id number]         Height:       66.0 in Accession #:    [id number]        Weight:       149.7 lb Date of Birth:  1966/06/08         BSA:          1.768 m Patient Age:    58 years          BP:           103/79 mmHg Patient Gender: M                 HR:           100 bpm. Exam Location:  Inpatient Procedure: 2D Echo, Cardiac Doppler, Color Doppler and Intracardiac            Opacification Agent (Both Spectral and Color Flow Doppler were            utilized during procedure). Indications:    other abnormalities of the heart  History:        Patient has prior history of Echocardiogram examinations, most                 recent 06/19/2023.  Sonographer:    Therisa Crouch Referring Phys:  [id number] ERIC J [place] IMPRESSIONS  1. Left ventricular ejection fraction, by estimation, is 65 to 70%. The left ventricle has normal function. Left ventricular endocardial border not optimally defined to evaluate regional wall motion. Left ventricular diastolic parameters are indeterminate.  2.  Right ventricular systolic function is normal. The right ventricular size is not well visualized.  3. The mitral valve is grossly normal. No evidence of mitral valve regurgitation.  4. The aortic valve was not well visualized. There is mild calcification of the aortic valve. Aortic valve regurgitation is not visualized. No aortic stenosis is present. Comparison(s): Prior images reviewed side by side. Both studies are technically difficult; this study is more complete than prior. FINDINGS  Left Ventricle: Left ventricular ejection fraction, by estimation, is 65 to 70%. The left ventricle has normal function. Left ventricular endocardial border not optimally defined to evaluate regional wall motion. The left ventricular internal cavity size was normal in size. There is no left ventricular hypertrophy. Left ventricular diastolic parameters are indeterminate. Right Ventricle: The right ventricular size is not well visualized. Right vetricular wall thickness was not well visualized. Right ventricular systolic function is normal. Left Atrium: Left atrial size was not well visualized. Right Atrium: Right atrial size was not well visualized. Pericardium: There is no evidence of pericardial effusion. Mitral Valve: The mitral valve is grossly normal. No evidence of mitral valve regurgitation. Tricuspid Valve: The tricuspid valve is not well visualized. Tricuspid valve regurgitation is not demonstrated. Aortic Valve: The aortic valve was not well visualized. There is mild calcification of the aortic valve. Aortic valve regurgitation is not visualized. No aortic stenosis is present. Pulmonic Valve: The pulmonic valve was normal in  structure. Pulmonic valve regurgitation is not visualized. No evidence of pulmonic stenosis. Aorta: The aortic root is normal in size and structure. IAS/Shunts: The interatrial septum was not well visualized.  LEFT VENTRICLE PLAX 2D LVIDd:         3.20 cm LVIDs:         2.50 cm LV PW:         0.60 cm LV IVS:        0.70 cm  LEFT ATRIUM         Index LA diam:    2.10 cm 1.19 cm/m   AORTA Ao Root diam: 2.80 cm Ao Asc diam:  2.80 cm Stanly Leavens MD Electronically signed by Stanly Leavens MD Signature Date/Time: 05/08/2024/4:04:25 PM    Final    VAS US  LOWER EXTREMITY VENOUS (DVT) (ONLY [facility]) Result Date: 05/08/2024  Lower Venous DVT Study Patient Name:  Daniel Stuart  Date of Exam:   05/07/2024 Medical Rec #: [id number]          Accession #:    [id number] Date of Birth: 10/24/1966          Patient Gender: M Patient Age:   60 years Exam Location:  [facility] Procedure:      VAS US  LOWER EXTREMITY VENOUS (DVT) Referring Phys: LAMAR PATERSON --------------------------------------------------------------------------------  Indications: Extensive DVT noted in the left lower extremity and IVC by CT.  Risk Factors: Possible antiphospholipid syndrome. Immobility (bed bound at nursing facility). Limitations: Right leg contraction, patient moving leg away from probe, significant abdominal distention and hardness,. Comparison Study: No prior LEV on file Performing Technologist: Alberta Lis RVS  Examination Guidelines: A complete evaluation includes B-mode imaging, spectral Doppler, color Doppler, and power Doppler as needed of all accessible portions of each vessel. Bilateral testing is considered an integral part of a complete examination. Limited examinations for reoccurring indications may be performed as noted. The reflux portion of the exam is performed with the patient in reverse Trendelenburg.  +---------+---------------+---------+-----------+----------+-------------------+ RIGHT     CompressibilityPhasicitySpontaneityPropertiesThrombus Aging      +---------+---------------+---------+-----------+----------+-------------------+  CFV      Full           Yes      Yes                                      +---------+---------------+---------+-----------+----------+-------------------+ SFJ      Full                                                             +---------+---------------+---------+-----------+----------+-------------------+ FV Prox  Full           Yes      Yes                                      +---------+---------------+---------+-----------+----------+-------------------+ FV Mid   Full           Yes      Yes                                      +---------+---------------+---------+-----------+----------+-------------------+ FV DistalFull           Yes      Yes                                      +---------+---------------+---------+-----------+----------+-------------------+ PFV      Full                                                             +---------+---------------+---------+-----------+----------+-------------------+ POP      Full           Yes      Yes                                      +---------+---------------+---------+-----------+----------+-------------------+ PTV                                                   Not well visualized +---------+---------------+---------+-----------+----------+-------------------+ PERO                                                  Not well visualized +---------+---------------+---------+-----------+----------+-------------------+   +---------+---------------+---------+-----------+----------+-------------------+ LEFT     CompressibilityPhasicitySpontaneityPropertiesThrombus Aging      +---------+---------------+---------+-----------+----------+-------------------+ CFV      None           Yes      Yes  Subacute             +---------+---------------+---------+-----------+----------+-------------------+ SFJ      Partial        Yes      Yes                  Subacute            +---------+---------------+---------+-----------+----------+-------------------+ FV Prox  None           No       No                   Age Indeterminate   +---------+---------------+---------+-----------+----------+-------------------+ FV Mid   None           No       No                   Age Indeterminate   +---------+---------------+---------+-----------+----------+-------------------+ FV DistalNone           No       No                   Age Indeterminate   +---------+---------------+---------+-----------+----------+-------------------+ PFV      None           No       No                   Age Indeterminate   +---------+---------------+---------+-----------+----------+-------------------+ POP      None           No       No                   Age Indeterminate   +---------+---------------+---------+-----------+----------+-------------------+ PTV      None                                         Age Indeterminate   +---------+---------------+---------+-----------+----------+-------------------+ PERO                                                  Not well visualized +---------+---------------+---------+-----------+----------+-------------------+ EIV                                                   appears patent in                                                         this limited exam   +---------+---------------+---------+-----------+----------+-------------------+ Unable to visualize iliacs or IVC secondary to abdominal distention and hardness.    Summary: RIGHT: - There is no evidence of deep vein thrombosis in the lower extremity. However, portions of this examination were limited- see technologist comments above.  - Ultrasound characteristics of enlarged lymph nodes are noted in the  groin.  LEFT: - Findings consistent with subacute deep vein thrombosis involving the left common femoral vein, and SF junction.  - Findings consistent with age indeterminate deep vein  thrombosis involving the left femoral vein, left proximal profunda vein, left popliteal vein, and left posterior tibial veins.  - Ultrasound characteristics of enlarged lymph nodes noted in the groin.  *See table(s) above for measurements and observations. Electronically signed by Penne Colorado MD on 05/08/2024 at 3:53:22 PM.    Final    MR Brain W and Wo Contrast Result Date: 05/08/2024 EXAM: MRI BRAIN WITH AND WITHOUT CONTRAST 05/08/2024 04:26:00 AM TECHNIQUE: Multiplanar multisequence MRI of the head/brain was performed with and without the administration of intravenous contrast (10mL gadobutrol  (GADAVIST ) 1 MMOL/ML). COMPARISON: CT head without contrast 05/07/2024. MR head without and with contrast 06/20/2023. MR head without contrast 09/08/2022. CLINICAL HISTORY: Eval lesion seen on CT scan. FINDINGS: BRAIN AND VENTRICLES: Remote right parietal and occipital hematoma is present. Cortical laminar necrosis is present. No acute hemorrhage or underlying mass lesion is present. Chronic confluent periventricular and subcortical white matter T2 hyperintensity is similar to the prior exam. Chronic diffuse atrophy is present. The ventricles are proportional to the degree of atrophy. ORBITS: No acute abnormality. SINUSES: Minimal mucosal thickening is present along the inferior maxillary sinuses bilaterally. BONES AND SOFT TISSUES: Normal bone marrow signal and enhancement. No acute soft tissue abnormality. LIMITATIONS/ARTIFACTS: This study is mildly degraded by patient motion. IMPRESSION: 1. No acute intracranial abnormality. 2. Remote right parietal and occipital hematoma and cortical laminar necrosis. The CT findings are chronic and expected. 3. Chronic confluent periventricular and subcortical white matter T2 hyperintensity, similar to  prior exam. 4. Chronic diffuse atrophy with proportional ventricles. Electronically signed by: Lonni Necessary MD 05/08/2024 04:42 AM EDT RP Workstation: [id number]   CT Head Wo Contrast Addendum Date: 05/07/2024 ADDENDUM REPORT: 05/07/2024 20:40 ADDENDUM: These results were called by telephone at the time of interpretation on 05/07/2024 at 8:40 pm to provider [facility] , who verbally acknowledged these results. Electronically Signed   By: Morgane  Naveau M.D.   On: 05/07/2024 20:40   Result Date: 05/07/2024 CLINICAL DATA:  eval for ICH EXAM: CT HEAD WITHOUT CONTRAST TECHNIQUE: Contiguous axial images were obtained from the base of the skull through the vertex without intravenous contrast. RADIATION DOSE REDUCTION: This exam was performed according to the departmental dose-optimization program which includes automated exposure control, adjustment of the mA and/or kV according to patient size and/or use of iterative reconstruction technique. COMPARISON:  CT head 06/19/2023 FINDINGS: Brain: Redemonstration of marked bilateral cerebral encephalomalacia. Interval development of hyperdensity conforming to the left occipital gyri. No mass lesion. No extra-axial collection. No mass effect or midline shift. No hydrocephalus. Basilar cisterns are patent. Vascular: No hyperdense vessel. Skull: No acute fracture or focal lesion. Sinuses/Orbits: Paranasal sinuses and mastoid air cells are clear. The orbits are unremarkable. Other: None. IMPRESSION: 1. Interval development of hyperdensity conforming to the left occipital gyri. Finding could be related to an acute hemorrhage. Differential diagnosis includes contrast staining versus infarction with blood brain barrier breakdown. Recommend MRI without contrast for further evaluation. 2. Redemonstration of marked bilateral cerebral encephalomalacia. Electronically Signed: By: Morgane  Naveau M.D. On: 05/07/2024 20:36   CT Angio Chest PE W and/or Wo Contrast Result Date:  05/07/2024 CLINICAL DATA:  Abdominal imaging revealed large DVT involving the IVC, left common iliac, left external iliac, and left common femoral vein. EXAM: CT ANGIOGRAPHY CHEST WITH CONTRAST TECHNIQUE: Multidetector CT imaging of the chest was performed using the standard protocol during bolus administration of intravenous contrast. Multiplanar CT image reconstructions and MIPs were obtained to evaluate the vascular anatomy. RADIATION DOSE REDUCTION: This  exam was performed according to the departmental dose-optimization program which includes automated exposure control, adjustment of the mA and/or kV according to patient size and/or use of iterative reconstruction technique. CONTRAST:  75mL OMNIPAQUE  IOHEXOL  350 MG/ML SOLN COMPARISON:  CT abdomen/pelvis from 05/07/2024 FINDINGS: Cardiovascular: Suspected chronic embolus in the right middle lobe pulmonary arterial branches, with very small caliber feeders potentially reflecting mild recanalization chronic embolus for example on images 151-163 of series 10. The right middle lobe demonstrates chronic volume loss and chronic airspace opacity. Suspicion for anterior mural thrombus along the right ventricular outflow tract just proximal to the pulmonic valve, for example on image 101 series 8, consider echocardiographic confirmation. Mediastinum/Nodes: Small type 1 hiatal hernia. Lungs/Pleura: Emphysema. Chronic volume loss and consolidation in the right middle lobe. Mild dependent atelectasis in both lower lobes. Hazy ground-glass density in the lungs, cannot exclude mild edema or alveolitis. No pleural effusion. Upper Abdomen: Prominently dilated upper abdominal large bowel as shown on CT abdomen. Musculoskeletal: Unremarkable Review of the MIP images confirms the above findings. IMPRESSION: 1. Suspected chronic embolus in the right middle lobe pulmonary arterial branches, with very small caliber feeders potentially reflecting mild recanalization of chronic embolus.  This is not a characteristic appearance for acute pulmonary embolus. The right middle lobe demonstrates chronic volume loss and chronic airspace opacity. 2. Suspicion for anterior mural thrombus along the right ventricular outflow tract just proximal to the pulmonic valve. Echocardiography could be utilized for confirmation. 3. Hazy ground-glass density in the lungs, cannot exclude mild edema or alveolitis. 4. Prominently dilated upper abdominal large bowel as shown on CT abdomen. 5. Small type 1 hiatal hernia. 6.  Emphysema (ICD10-J43.9). Electronically Signed   By: Ryan Salvage M.D.   On: 05/07/2024 17:24   CT ANGIO GI BLEED Addendum Date: 05/07/2024 ADDENDUM REPORT: 05/07/2024 15:16 ADDENDUM: Chest CT recommended to exclude pulmonary embolism. Electronically Signed   By: Vanetta Chou M.D.   On: 05/07/2024 15:16   Result Date: 05/07/2024 CLINICAL DATA:  Abdominal distension and rectal bleeding. EXAM: CTA ABDOMEN AND PELVIS WITHOUT AND WITH CONTRAST TECHNIQUE: Multidetector CT imaging of the abdomen and pelvis was performed using the standard protocol during bolus administration of intravenous contrast. Multiplanar reconstructed images and MIPs were obtained and reviewed to evaluate the vascular anatomy. RADIATION DOSE REDUCTION: This exam was performed according to the departmental dose-optimization program which includes automated exposure control, adjustment of the mA and/or kV according to patient size and/or use of iterative reconstruction technique. CONTRAST:  OMNIPAQUE  IOHEXOL  350 MG/ML SOLN COMPARISON:  CT abdomen pelvis dated 04/13/2024. FINDINGS: VASCULAR Aorta: Mild calcified and noncalcified plaque of the abdominal aorta. No aneurysmal dilatation or dissection. No periaortic fluid collection. Celiac: Patent without evidence of aneurysm, dissection, vasculitis or significant stenosis. SMA: Patent without evidence of aneurysm, dissection, vasculitis or significant stenosis. Renals: Both  renal arteries are patent without evidence of aneurysm, dissection, vasculitis, fibromuscular dysplasia or significant stenosis. IMA: Patent without evidence of aneurysm, dissection, vasculitis or significant stenosis. Inflow: Mild atherosclerotic calcification. No aneurysmal dilatation or dissection. The iliac arteries are patent. Proximal Outflow: The visualized proximal outflow is patent. Veins: Large clot extends from the visualized left femoral and deep femoral vein with proximal extension to the IVC at the level of the renal vein. This has significantly progressed since the prior CT. The SMV, splenic vein, and main portal vein are patent. No portal venous gas. Review of the MIP images confirms the above findings. NON-VASCULAR Lower chest: Diffuse chronic interstitial coarsening. Right  middle lobe atelectasis. Pneumonia is not excluded. No intra-abdominal free air or free fluid. Hepatobiliary: The liver is unremarkable. No biliary dilatation. The gallbladder is unremarkable. Pancreas: Unremarkable. No pancreatic ductal dilatation or surrounding inflammatory changes. Spleen: Normal in size without focal abnormality. Adrenals/Urinary Tract: The adrenal glands unremarkable. Multiple nonobstructing bilateral renal calculi measure up to 6 mm. No hydronephrosis or obstructing stone. The visualized ureters and urinary bladder appear unremarkable. Stomach/Bowel: Mild thickened appearance of the rectum may represent proctitis. Clinical correlation recommended. There is loose stool throughout the colon consistent with diarrheal state. Retained oral contrast noted in the colon. Diffuse colonic dilatation similar or slightly progressed since the prior CT consistent with colonic ileus. No evidence of small-bowel obstruction. The appendix is normal. Lymphatic: No adenopathy. Reproductive: The prostate and seminal vesicles are grossly unremarkable. Other: None Musculoskeletal: No acute osseous pathology. IMPRESSION: 1. No  evidence of active GI bleed. 2. Large DVT extends from the left lower extremity veins into the IVC. This has significantly progressed since the prior CT. 3. Colonic ileus. No evidence of small-bowel obstruction. Normal appendix. 4. Multiple nonobstructing bilateral renal calculi measure up to 6 mm. No hydronephrosis or obstructing stone. 5. Right middle lobe atelectasis. Pneumonia is not excluded. These results were called by telephone at the time of interpretation on 05/07/2024 at 3:08 pm to provider [facility] , who verbally acknowledged these results. Electronically Signed: By: Vanetta Chou M.D. On: 05/07/2024 15:09   DG Abd Portable 1V Result Date: 04/14/2024 CLINICAL DATA:  [id number] Ileus (HCC) [id number] EXAM: PORTABLE ABDOMEN - 1 VIEW COMPARISON:  April 13, 2024 FINDINGS: Persistent gaseous dilation of the colon. Extent of dilation is favored similar to minimally improved since prior CT. Evaluation for free air is limited due to supine positioning. Bilateral nonobstructing nephrolithiasis. IMPRESSION: Persistent gaseous dilation of the colon consistent with reported colonic ileus. Extent of dilation is favored similar to minimally improved since prior CT. Electronically Signed   By: Corean Salter M.D.   On: 04/14/2024 13:32   CT ABDOMEN PELVIS W CONTRAST Result Date: 04/13/2024 CLINICAL DATA:  Acute abdominal pain, acute nonlocalized. History of colonic ileus EXAM: CT ABDOMEN AND PELVIS WITH CONTRAST TECHNIQUE: Multidetector CT imaging of the abdomen and pelvis was performed using the standard protocol following bolus administration of intravenous contrast. RADIATION DOSE REDUCTION: This exam was performed according to the departmental dose-optimization program which includes automated exposure control, adjustment of the mA and/or kV according to patient size and/or use of iterative reconstruction technique. CONTRAST:  OMNIPAQUE  IOHEXOL  300 MG/ML  SOLN COMPARISON:  CT 03/06/2024 FINDINGS: Lower  chest: Segmental consolidation lateral aspect of the RIGHT lower lobe (image 34/series 5 is new finding from prior. Hepatobiliary: No focal hepatic lesion. Normal gallbladder. No biliary duct dilatation. Common bile duct is normal. Pancreas: Pancreas is normal. No ductal dilatation. No pancreatic inflammation. Spleen: Normal spleen Adrenals/urinary tract: Adrenal glands normal. Nonobstructing calculi within the kidneys. Ureters and bladder normal. Stomach/Bowel: Stomach is decompressed. The small bowel is decompressed. No evidence small bowel obstruction. Terminal ileum is normal. Appendix normal. The ascending and transverse colon is gas-filled and distended. Transverse colon measures 7.5 cm diameter. There is fluid stool in the descending colon and rectosigmoid colon. The LEFT colon is ahaustral. Findings are similar to comparison exam with mild increase in gaseous distension. No pneumatosis. No intraperitoneal free air Vascular/Lymphatic: Abdominal aorta is normal caliber. No periportal or retroperitoneal adenopathy. No pelvic adenopathy. Reproductive: Unremarkable Other: No free fluid. Musculoskeletal: No aggressive osseous lesion. IMPRESSION:  1. Persistent colonic ileus pattern. Gaseous distention of the colon slightly increased. Fluid stool in the a ahaustral left colon. 2. New airspace disease in lateral segment of the RIGHT lower lobe suggest pneumonia. Electronically Signed   By: Jackquline Boxer M.D.   On: 04/13/2024 15:34    Labs: CBC    Component Value Date/Time   WBC 6.8 05/10/2024 0620   RBC 4.58 05/10/2024 0620   HGB 12.4 (L) 05/10/2024 0620   HGB 15.3 06/11/2020 1041   HCT 37.1 (L) 05/10/2024 0620   HCT 44.9 06/11/2020 1041   PLT 201 05/10/2024 0620   PLT 230 06/11/2020 1041   MCV 81.0 05/10/2024 0620   MCV 93 06/11/2020 1041   MCH 27.1 05/10/2024 0620   MCHC 33.4 05/10/2024 0620   RDW 15.3 05/10/2024 0620   RDW 13.8 06/11/2020 1041   LYMPHSABS 2.4 05/07/2024 1308   LYMPHSABS  1.7 06/11/2020 1041   MONOABS 0.6 05/07/2024 1308   EOSABS 0.1 05/07/2024 1308   EOSABS 0.1 06/11/2020 1041   BASOSABS 0.1 05/07/2024 1308   BASOSABS 0.0 06/11/2020 1041    CMP     Component Value Date/Time   NA 140 05/10/2024 0620   NA 140 01/11/2022 1121   K 2.8 (L) 05/10/2024 0620   CL 112 (H) 05/10/2024 0620   CO2 15 (L) 05/10/2024 0620   GLUCOSE 83 05/10/2024 0620   BUN <5 (L) 05/10/2024 0620   BUN 5 (L) 01/11/2022 1121   CREATININE 1.05 05/10/2024 0620   CREATININE 1.24 04/18/2017 1012   CALCIUM  8.1 (L) 05/10/2024 0620   PROT 7.9 05/07/2024 1308   PROT 7.6 01/11/2022 1121   ALBUMIN 3.3 (L) 05/07/2024 1308   ALBUMIN 4.2 01/11/2022 1121   AST 16 05/07/2024 1308   ALT 16 05/07/2024 1308   ALKPHOS 141 (H) 05/07/2024 1308   BILITOT 0.7 05/07/2024 1308   BILITOT 0.4 01/11/2022 1121   GFRNONAA >60 05/10/2024 0620   GFRNONAA 66 04/18/2017 1012   GFRAA 80 06/11/2020 1041   GFRAA 76 04/18/2017 1012    Assessment and Plan:   Daniel Stuart is a 58 y.o. y/o male has been referred for   1.  Chronic abdominal pain, chronic ileus  2.  Extensive left lower extremity DVT with extension to IVC-with pulmonary embolism and possible intramural RV thrombus. History of possible antiphospholipid syndrome-(off Eliquis  since August 2021 secondary to ICH) Difficult situation-has history of possible antiphospholipid antibody syndrome-numerous prior cryptogenic ischemic strokes-previously on anticoagulation but this was discontinued following ICH August 2024.  His current extensive VTE is likely multifactorial-from underlying antiphospholipid syndrome and bedbound status.  Admitting MD discussed with neurology-okay to start anticoagulation-subsequently was started on IV heparin -which he tolerated and was subsequently switched to Eliquis .    Follow up ***  Ellouise Console, PA-C

## 2024-05-10 NOTE — Progress Notes (Signed)
 PROGRESS NOTE        PATIENT DETAILS Name: Daniel Stuart Age: 58 y.o. Sex: male Date of Birth: [birth year] Admit Date: 05/07/2024 Admitting Physician Redia LOISE Cleaver, MD ERE:Ejdzij, Folashade R, [name]  Brief Summary: Patient is a 58 y.o.  male with history of multiple cryptogenic strokes, possible antiphospholipid syndrome-off all anticoagulants since ICH with right-sided weakness (August 2024)-resident of SNF-presented to [facility] ED from SNF with abdominal distention/possible dark stools-CT imaging done to evaluate abdominal distention/dark stools showed DVT in LLE extending to IVC.  Stools were brown and FOBT negative in the ED.  He was started on IV heparin -vascular surgery was consulted and patient was transferred to [facility] for further evaluation and treatment.  Significant events: 7/7>> admit to [facility]-transferred from [facility] to [facility] for vascular surgery evaluation given large clot burden.  Significant studies: 7/7>> CT angio GI bleed: No active GI bleeding seen, large DVT from LLE to IVC.  Colonic ileus. 7/7>> CTA chest: Suspected chronic embolus right middle lobe pulmonary artery.  Suspicion for anterior moral thrombus along the right ventricular outflow tract. 7/7>> B/L lower extremity Doppler: DVT involving left common femoral/SFA junction, age-indeterminate DVT in left femoral/left proximal profunda, left popliteal/, left posterior tibial. 7/8>> MRI brain: No acute intracranial abnormality. 7/8>> TTE: EF 65-70%.  Significant microbiology data: None  Procedures: None  Consults: VVS Palliative care.  Subjective: No major issues overnight-lying comfortably in bed.  Objective: Vitals: Blood pressure 104/68, pulse 62, temperature 98.4 F (36.9 C), temperature source Oral, resp. rate 14, height 5' 6 (1.676 m), weight 67.9 kg, SpO2 99%.   Exam: Awake/alert Not in any distress Right-sided hemiparesis (RLE weaker than RUE)  Pertinent  Labs/Radiology:    Latest Ref Rng & Units 05/10/2024    6:20 AM 05/08/2024   11:54 PM 05/07/2024    1:08 PM  CBC  WBC 4.0 - 10.5 K/uL 6.8  8.6  9.1   Hemoglobin 13.0 - 17.0 g/dL 87.5  87.6  86.0   Hematocrit 39.0 - 52.0 % 37.1  37.2  43.1   Platelets 150 - 400 K/uL 201  187  188     Lab Results  Component Value Date   NA 140 05/10/2024   K 2.8 (L) 05/10/2024   CL 112 (H) 05/10/2024   CO2 15 (L) 05/10/2024     Assessment/Plan: Extensive LLE DVT with extension to IVC-with pulmonary embolism and possible intramural RV thrombus History of possible antiphospholipid syndrome-(off Eliquis  since August 2021 secondary to ICH) Difficult situation-has history of possible antiphospholipid antibody syndrome-numerous prior cryptogenic ischemic strokes-previously on anticoagulation but this was discontinued following ICH August 2024.  His current extensive VTE is likely multifactorial-from underlying antiphospholipid syndrome and bedbound status.  IV heparin -has been switched to Eliquis   (note admitting MD had discussed with neurology on-call-okay to start IV heparin /anticoagulation) Per vascular surgery-not a candidate for thrombectomy  Hypomagnesemia/hypokalemia Unfortunately-continues to have hypokalemia/hypomagnesemia-being repleted-will recheck later this afternoon.  Chronic ileus Mild abdominal distention-no vomiting Maintain on bowel regimen-appears to have had a BM earlier this morning Supportive care  History of numerous cryptogenic strokes-possible positive antiphospholipid syndrome antibodies. See above-now on IV heparin  MRI brain negative for acute abnormalities Appears to have chronic right-sided hemiparesis at baseline.  Per prior notes-bedbound and SNF resident.  History of ICH August 2024 See above-seems to have tolerated anticoagulation well.  History of possible vascular  dementia Progressive leukoencephalopathy Likely sequelae of numerous CVA/ICH Supportive care  Vitamin  B12 deficiency Continue cyanocobalamin  Mild obesity anemia recheck repeat B12 levels at discretion  History of neuropathy Continue gabapentin .  Code status:   Code Status: Full Code   DVT Prophylaxis:IV Heparin >>eliquis   Family Communication: Sister-[phone]-left voicemail on 7/9   Disposition Plan: Status is: Inpatient Remains inpatient appropriate because: Severity of illness   Planned Discharge Destination:Skilled nursing facility   Diet: Diet Order             DIET SOFT Room service appropriate? No; Fluid consistency: Thin  Diet effective now                     Antimicrobial agents: Anti-infectives (From admission, onward)    None        MEDICATIONS: Scheduled Meds:  apixaban   5 mg Oral BID   cyanocobalamin   1,000 mcg Oral Daily   gabapentin   300 mg Oral BID   midodrine   2.5 mg Oral TID WC   pantoprazole   40 mg Oral QHS   polyethylene glycol  17 g Oral BID   potassium chloride   40 mEq Oral Once   senna-docusate  2 tablet Oral QHS   simethicone   80 mg Oral TID AC & HS   sodium bicarbonate   650 mg Oral BID   Continuous Infusions:  potassium chloride  10 mEq (05/10/24 1109)   PRN Meds:.acetaminophen  **OR** acetaminophen , albuterol , bisacodyl , oxyCODONE    I have personally reviewed following labs and imaging studies  LABORATORY DATA: CBC: Recent Labs  Lab 05/07/24 1308 05/08/24 2354 05/10/24 0620  WBC 9.1 8.6 6.8  NEUTROABS 6.0  --   --   HGB 13.9 12.3* 12.4*  HCT 43.1 37.2* 37.1*  MCV 83.0 81.0 81.0  PLT 188 187 201    Basic Metabolic Panel: Recent Labs  Lab 05/07/24 1308 05/08/24 0129 05/08/24 2354 05/09/24 0747 05/10/24 0620  NA 135  --  140 137 140  K 3.0*  --  3.2* 2.7* 2.8*  CL 104  --  111 111 112*  CO2 19*  --  17* 15* 15*  GLUCOSE 98  --  94 89 83  BUN 13  --  6 5* <5*  CREATININE 1.10  --  1.08 1.15 1.05  CALCIUM  8.7*  --  8.0* 8.0* 8.1*  MG 1.8 1.7 1.6*  --  1.8  PHOS  --   --  2.8  --   --      GFR: Estimated Creatinine Clearance: 67.5 mL/min (by C-G formula based on SCr of 1.05 mg/dL).  Liver Function Tests: Recent Labs  Lab 05/07/24 1308  AST 16  ALT 16  ALKPHOS 141*  BILITOT 0.7  PROT 7.9  ALBUMIN 3.3*   No results for input(s): LIPASE, AMYLASE in the last 168 hours. No results for input(s): AMMONIA in the last 168 hours.  Coagulation Profile: No results for input(s): INR, PROTIME in the last 168 hours.  Cardiac Enzymes: No results for input(s): CKTOTAL, CKMB, CKMBINDEX, TROPONINI in the last 168 hours.  BNP (last 3 results) No results for input(s): PROBNP in the last 8760 hours.  Lipid Profile: No results for input(s): CHOL, HDL, LDLCALC, TRIG, CHOLHDL, LDLDIRECT in the last 72 hours.  Thyroid  Function Tests: No results for input(s): TSH, T4TOTAL, FREET4, T3FREE, THYROIDAB in the last 72 hours.  Anemia Panel: No results for input(s): VITAMINB12, FOLATE, FERRITIN, TIBC, IRON, RETICCTPCT in the last 72 hours.  Urine analysis:  Component Value Date/Time   COLORURINE STRAW (A) 04/13/2024 1613   APPEARANCEUR CLEAR 04/13/2024 1613   LABSPEC 1.031 (H) 04/13/2024 1613   PHURINE 6.0 04/13/2024 1613   GLUCOSEU NEGATIVE 04/13/2024 1613   HGBUR NEGATIVE 04/13/2024 1613   BILIRUBINUR NEGATIVE 04/13/2024 1613   BILIRUBINUR negative 07/10/2021 1202   BILIRUBINUR neg 06/11/2020 0901   KETONESUR NEGATIVE 04/13/2024 1613   PROTEINUR NEGATIVE 04/13/2024 1613   UROBILINOGEN 0.2 07/10/2021 1202   UROBILINOGEN 0.2 11/30/2017 1030   NITRITE NEGATIVE 04/13/2024 1613   LEUKOCYTESUR NEGATIVE 04/13/2024 1613    Sepsis Labs: Lactic Acid, Venous    Component Value Date/Time   LATICACIDVEN 1.0 04/13/2024 2337    MICROBIOLOGY: No results found for this or any previous visit (from the past 240 hours).  RADIOLOGY STUDIES/RESULTS: ECHOCARDIOGRAM COMPLETE Result Date: 05/08/2024    ECHOCARDIOGRAM REPORT    Patient Name:   PLATON AROCHO Date of Exam: 05/08/2024 Medical Rec #:  [id number]         Height:       66.0 in Accession #:    [id number]        Weight:       149.7 lb Date of Birth:  08-Sep-1966         BSA:          1.768 m Patient Age:    58 years          BP:           103/79 mmHg Patient Gender: M                 HR:           100 bpm. Exam Location:  Inpatient Procedure: 2D Echo, Cardiac Doppler, Color Doppler and Intracardiac            Opacification Agent (Both Spectral and Color Flow Doppler were            utilized during procedure). Indications:    other abnormalities of the heart  History:        Patient has prior history of Echocardiogram examinations, most                 recent 06/19/2023.  Sonographer:    Therisa Crouch Referring Phys: [id number] ERIC J [place] IMPRESSIONS  1. Left ventricular ejection fraction, by estimation, is 65 to 70%. The left ventricle has normal function. Left ventricular endocardial border not optimally defined to evaluate regional wall motion. Left ventricular diastolic parameters are indeterminate.  2. Right ventricular systolic function is normal. The right ventricular size is not well visualized.  3. The mitral valve is grossly normal. No evidence of mitral valve regurgitation.  4. The aortic valve was not well visualized. There is mild calcification of the aortic valve. Aortic valve regurgitation is not visualized. No aortic stenosis is present. Comparison(s): Prior images reviewed side by side. Both studies are technically difficult; this study is more complete than prior. FINDINGS  Left Ventricle: Left ventricular ejection fraction, by estimation, is 65 to 70%. The left ventricle has normal function. Left ventricular endocardial border not optimally defined to evaluate regional wall motion. The left ventricular internal cavity size was normal in size. There is no left ventricular hypertrophy. Left ventricular diastolic parameters are indeterminate. Right Ventricle: The  right ventricular size is not well visualized. Right vetricular wall thickness was not well visualized. Right ventricular systolic function is normal. Left Atrium: Left atrial size was not well visualized. Right Atrium: Right atrial  size was not well visualized. Pericardium: There is no evidence of pericardial effusion. Mitral Valve: The mitral valve is grossly normal. No evidence of mitral valve regurgitation. Tricuspid Valve: The tricuspid valve is not well visualized. Tricuspid valve regurgitation is not demonstrated. Aortic Valve: The aortic valve was not well visualized. There is mild calcification of the aortic valve. Aortic valve regurgitation is not visualized. No aortic stenosis is present. Pulmonic Valve: The pulmonic valve was normal in structure. Pulmonic valve regurgitation is not visualized. No evidence of pulmonic stenosis. Aorta: The aortic root is normal in size and structure. IAS/Shunts: The interatrial septum was not well visualized.  LEFT VENTRICLE PLAX 2D LVIDd:         3.20 cm LVIDs:         2.50 cm LV PW:         0.60 cm LV IVS:        0.70 cm  LEFT ATRIUM         Index LA diam:    2.10 cm 1.19 cm/m   AORTA Ao Root diam: 2.80 cm Ao Asc diam:  2.80 cm Stanly Leavens MD Electronically signed by Stanly Leavens MD Signature Date/Time: 05/08/2024/4:04:25 PM    Final      LOS: 2 days   Donalda Applebaum, MD  [facility]    To contact the attending provider between 7A-7P or the covering provider during after hours 7P-7A, please log into the web site [email or web address] and access using universal [place] password for that web site. If you do not have the password, please call the hospital operator.  05/10/2024, 11:48 AM

## 2024-05-10 NOTE — Progress Notes (Signed)
 Palliative:  HPI: 58 y.o. male  with past medical history of ICH with residual right sided weakness (bedbound at baseline), aggressive leukoencephalopathy, PAD/left ICA stenosis, cortical blindness, HTN, HLD, antiphospholipid syndrome, persistent colonic ileus, s/p PPM admitted on 05/07/2024 from [facility] with abd distention and blood in stool - no active bleeding identified off of anticoagulation since 2024 due to ICH.   Chart reviewed. I met today at Daniel Stuart's bedside. He appears uncomfortable and he is sitting in stool. He reports leg pain - RN to provide OxyIR dose. RN and NT to bedside to help clean. I do see gabapentin  he is receiving home dose - consider increasing if ongoing pain. Difficult to [place] to provide specifics and details.   I called and left voicemail for sister, [name], to continue goals of care conversation. Will await return call. They would benefit from ongoing goals of care conversations and outpatient palliative care.   Exam: Alert, appropriate in conversation but underlying confusion and poor recall/poor memory. No distress. Breathing regular, unlabored. Abd soft.   Plan: - Unable to connect with sister, [name], today - Recommend ongoing palliative care conversations as able  25 min  [name], NP Palliative Medicine Team Pager [phone] (Please see [email or web address] for schedule) Team Phone [phone]

## 2024-05-10 NOTE — Discharge Summary (Signed)
 PATIENT DETAILS Name: Daniel Stuart Age: 58 y.o. Sex: male Date of Birth: [birth year] MRN: [id number]. Admitting Physician: Redia LOISE Cleaver, MD ERE:Ejdzij, Folashade R, [name]  Admit Date: 05/07/2024 Discharge date: 05/11/2024  Recommendations for Outpatient Follow-up:  Follow up with PCP in 1-2 weeks Please obtain CMP/CBC in one week  Admitted From:  SNF  Disposition: Skilled nursing facility   Discharge Condition: good  CODE STATUS:   Code Status: Full Code   Diet recommendation:  Diet Order             Diet - low sodium heart healthy           DIET SOFT Room service appropriate? No; Fluid consistency: Thin  Diet effective now                    Brief Summary: Patient is a 58 y.o.  male with history of multiple cryptogenic strokes, possible antiphospholipid syndrome-off all anticoagulants since ICH with right-sided weakness (August 2024)-resident of SNF-presented to [facility] ED from SNF with abdominal distention/possible dark stools-CT imaging done to evaluate abdominal distention/dark stools showed DVT in LLE extending to IVC.  Stools were brown and FOBT negative in the ED.  He was started on IV heparin -vascular surgery was consulted and patient was transferred to [facility] for further evaluation and treatment.   Significant events: 7/7>> admit to [facility]-transferred from [facility] to [facility] for vascular surgery evaluation given large clot burden.   Significant studies: 7/7>> CT angio GI bleed: No active GI bleeding seen, large DVT from LLE to IVC.  Colonic ileus. 7/7>> CTA chest: Suspected chronic embolus right middle lobe pulmonary artery.  Suspicion for anterior moral thrombus along the right ventricular outflow tract. 7/7>> B/L lower extremity Doppler: DVT involving left common femoral/SFA junction, age-indeterminate DVT in left femoral/left proximal profunda, left popliteal/, left posterior tibial. 7/8>> MRI brain: No acute intracranial abnormality. 7/8>> TTE: EF  65-70%.   Significant microbiology data: None   Procedures: None   Consults: [facility] Palliative care.  Brief Hospital Course: Extensive LLE DVT with extension to IVC-with pulmonary embolism and possible intramural RV thrombus History of possible antiphospholipid syndrome-(off Eliquis  since August 2021 secondary to ICH) Difficult situation-has history of possible antiphospholipid antibody syndrome-numerous prior cryptogenic ischemic strokes-previously on anticoagulation but this was discontinued following ICH August 2024.  His current extensive VTE is likely multifactorial-from underlying antiphospholipid syndrome and bedbound status.  Admitting MD discussed with neurology-okay to start anticoagulation-subsequently was started on IV heparin -which he tolerated and was subsequently switched to Eliquis .     Hypomagnesemia/hypokalemia Repleted-continues to have mild hypokalemia-this will be repleted prior to discharge-appears to be on chronic KCl supplementation at SNF-this will be continued and will   Chronic ileus Mild abdominal distention-no vomiting Maintain on bowel regimen-appears to have had a BM earlier this morning Supportive care   History of numerous cryptogenic strokes-possible positive antiphospholipid syndrome antibodies. See above-now on IV heparin  MRI brain negative for acute abnormalities Appears to have chronic right-sided hemiparesis at baseline.  Per prior notes-bedbound and SNF resident.   History of ICH August 2024 See above-seems to have tolerated anticoagulation well.   History of possible vascular dementia Progressive leukoencephalopathy Likely sequelae of numerous CVA/ICH Supportive care   Vitamin B12 deficiency Continue cyanocobalamin   History of neuropathy Continue gabapentin .  Mild normal anion gap metabolic acidosis Seems to be a chronic issue dating back to last month Asymptomatic Watch closely-start oral bicarb supplementation-follow-up  electrolytes-repeat in 1 week at SNF.  Discharge Diagnoses:  Principal Problem:   Acute DVT (deep venous thrombosis) [facility])   Discharge Instructions:  Activity:  As tolerated with Full fall precautions use walker/cane & assistance as needed Discharge Instructions     Call MD for:  difficulty breathing, headache or visual disturbances   Complete by: As directed    Call MD for:  extreme fatigue   Complete by: As directed    Call MD for:  persistant dizziness or light-headedness   Complete by: As directed    Diet - low sodium heart healthy   Complete by: As directed    Discharge instructions   Complete by: As directed    Follow with Primary MD  Juanice Thomes SAUNDERS, [name] in 1-2 weeks  Please get a complete blood count and chemistry panel checked by your Primary MD at your next visit, and again as instructed by your Primary MD.  Get Medicines reviewed and adjusted: Please take all your medications with you for your next visit with your Primary MD  Laboratory/radiological data: Please request your Primary MD to go over all hospital tests and procedure/radiological results at the follow up, please ask your Primary MD to get all Hospital records sent to his/her office.  In some cases, they will be blood work, cultures and biopsy results pending at the time of your discharge. Please request that your primary care M.D. follows up on these results.  Also Note the following: If you experience worsening of your admission symptoms, develop shortness of breath, life threatening emergency, suicidal or homicidal thoughts you must seek medical attention immediately by calling 911 or calling your MD immediately  if symptoms less severe.  You must read complete instructions/literature along with all the possible adverse reactions/side effects for all the Medicines you take and that have been prescribed to you. Take any new Medicines after you have completely understood and accpet all the possible  adverse reactions/side effects.   Do not drive when taking Pain medications or sleeping medications (Benzodaizepines)  Do not take more than prescribed Pain, Sleep and Anxiety Medications. It is not advisable to combine anxiety,sleep and pain medications without talking with your primary care practitioner  Special Instructions: If you have smoked or chewed Tobacco  in the last 2 yrs please stop smoking, stop any regular Alcohol  and or any Recreational drug use.  Wear Seat belts while driving.  Please note: You were cared for by a hospitalist during your hospital stay. Once you are discharged, your primary care physician will handle any further medical issues. Please note that NO REFILLS for any discharge medications will be authorized once you are discharged, as it is imperative that you return to your primary care physician (or establish a relationship with a primary care physician if you do not have one) for your post hospital discharge needs so that they can reassess your need for medications and monitor your lab values.   Discharge wound care:   Complete by: As directed    Wound care  Every shift      Comments: Cleanse wound with Vashe (848841) allow to air dry, place Aquacel Ag (lawson #[id number]) over wound and cover with foam dressing.  Change daily. Off load pressure from ankle, utilize pillows beneath the lower leg to lift ankle off bed.   Increase activity slowly   Complete by: As directed       Allergies as of 05/11/2024   No Known Allergies      Medication List     TAKE these  medications    acetaminophen  325 MG tablet Commonly known as: TYLENOL  Take 2 tablets (650 mg total) by mouth every 6 (six) hours as needed for mild pain, moderate pain, fever or headache (or temp > 37.5 C (99.5 F)).   acidophilus Caps capsule Take 2 capsules by mouth daily.   apixaban  5 MG Tabs tablet Commonly known as: ELIQUIS  Take 1 tablet (5 mg total) by mouth 2 (two) times daily.    bisacodyl  10 MG suppository Commonly known as: DULCOLAX Place 1 suppository (10 mg total) rectally daily as needed for mild constipation.   cyanocobalamin  1000 MCG tablet Take 1 tablet (1,000 mcg total) by mouth daily.   feeding supplement Liqd Take 237 mLs by mouth 2 (two) times daily between meals. What changed:  when to take this additional instructions   nutrition supplement (JUVEN) Pack Take 1 packet by mouth 2 (two) times daily between meals. What changed: Another medication with the same name was changed. Make sure you understand how and when to take each.   gabapentin  300 MG capsule Commonly known as: NEURONTIN  TAKE 1 CAPSULE BY MOUTH BY MOUTH TWO TIMES DAILY What changed:  how much to take how to take this when to take this   ipratropium-albuterol  0.5-2.5 (3) MG/3ML Soln Commonly known as: DUONEB Take 3 mLs by nebulization every 6 (six) hours as needed (SOB, wheezing).   ketoconazole 2 % shampoo Commonly known as: NIZORAL Apply 1 Application topically 2 (two) times a week. Monday and Thursday   liver oil-zinc oxide 40 % ointment Commonly known as: DESITIN Apply 1 Application topically as needed for irritation (With each incontinent episode daily).   ondansetron  4 MG tablet Commonly known as: ZOFRAN  Take 1 tablet (4 mg total) by mouth every 6 (six) hours as needed for nausea.   pantoprazole  40 MG tablet Commonly known as: PROTONIX  Take 1 tablet (40 mg total) by mouth at bedtime.   polyethylene glycol 17 g packet Commonly known as: MIRALAX  / GLYCOLAX  Take 17 g by mouth 2 (two) times daily.   potassium chloride  20 MEQ packet Commonly known as: KLOR-CON  Take 40 mEq by mouth 2 (two) times daily.   senna-docusate 8.6-50 MG tablet Commonly known as: Senokot-S Take 1 tablet by mouth at bedtime.   simethicone  80 MG chewable tablet Commonly known as: MYLICON Chew 1 tablet (80 mg total) by mouth 4 (four) times daily -  before meals and at bedtime. What  changed: when to take this   sodium bicarbonate  650 MG tablet Take 1 tablet (650 mg total) by mouth 2 (two) times daily.   sodium phosphate Enem Place 1 enema rectally every other day.   vitamin D3 25 MCG tablet Commonly known as: CHOLECALCIFEROL  Take 1 tablet (1,000 Units total) by mouth daily.               Discharge Care Instructions  (From admission, onward)           Start     Ordered   05/10/24 0000  Discharge wound care:       Comments: Wound care  Every shift      Comments: Cleanse wound with Vashe (848841) allow to air dry, place Aquacel Ag soila #[id number]) over wound and cover with foam dressing.  Change daily. Off load pressure from ankle, utilize pillows beneath the lower leg to lift ankle off bed.   05/10/24 1252            Follow-up Information     Paseda,  Folashade R, [name]. Schedule an appointment as soon as possible for a visit in 1 week(s).   Specialty: Nurse Practitioner Contact information: [address] [place] [address] [phone]                No Known Allergies   Other Procedures/Studies: ECHOCARDIOGRAM COMPLETE Result Date: 05/08/2024    ECHOCARDIOGRAM REPORT   Patient Name:   YOUSSEF FOOTMAN Date of Exam: 05/08/2024 Medical Rec #:  [id number]         Height:       66.0 in Accession #:    [id number]        Weight:       149.7 lb Date of Birth:  [birth year]         BSA:          1.768 m Patient Age:    58 years          BP:           103/79 mmHg Patient Gender: M                 HR:           100 bpm. Exam Location:  Inpatient Procedure: 2D Echo, Cardiac Doppler, Color Doppler and Intracardiac            Opacification Agent (Both Spectral and Color Flow Doppler were            utilized during procedure). Indications:    other abnormalities of the heart  History:        Patient has prior history of Echocardiogram examinations, most                 recent 06/19/2023.  Sonographer:    Therisa Crouch Referring  Phys: [id number] ERIC J [place] IMPRESSIONS  1. Left ventricular ejection fraction, by estimation, is 65 to 70%. The left ventricle has normal function. Left ventricular endocardial border not optimally defined to evaluate regional wall motion. Left ventricular diastolic parameters are indeterminate.  2. Right ventricular systolic function is normal. The right ventricular size is not well visualized.  3. The mitral valve is grossly normal. No evidence of mitral valve regurgitation.  4. The aortic valve was not well visualized. There is mild calcification of the aortic valve. Aortic valve regurgitation is not visualized. No aortic stenosis is present. Comparison(s): Prior images reviewed side by side. Both studies are technically difficult; this study is more complete than prior. FINDINGS  Left Ventricle: Left ventricular ejection fraction, by estimation, is 65 to 70%. The left ventricle has normal function. Left ventricular endocardial border not optimally defined to evaluate regional wall motion. The left ventricular internal cavity size was normal in size. There is no left ventricular hypertrophy. Left ventricular diastolic parameters are indeterminate. Right Ventricle: The right ventricular size is not well visualized. Right vetricular wall thickness was not well visualized. Right ventricular systolic function is normal. Left Atrium: Left atrial size was not well visualized. Right Atrium: Right atrial size was not well visualized. Pericardium: There is no evidence of pericardial effusion. Mitral Valve: The mitral valve is grossly normal. No evidence of mitral valve regurgitation. Tricuspid Valve: The tricuspid valve is not well visualized. Tricuspid valve regurgitation is not demonstrated. Aortic Valve: The aortic valve was not well visualized. There is mild calcification of the aortic valve. Aortic valve regurgitation is not visualized. No aortic stenosis is present. Pulmonic Valve: The pulmonic valve was normal in  structure. Pulmonic valve  regurgitation is not visualized. No evidence of pulmonic stenosis. Aorta: The aortic root is normal in size and structure. IAS/Shunts: The interatrial septum was not well visualized.  LEFT VENTRICLE PLAX 2D LVIDd:         3.20 cm LVIDs:         2.50 cm LV PW:         0.60 cm LV IVS:        0.70 cm  LEFT ATRIUM         Index LA diam:    2.10 cm 1.19 cm/m   AORTA Ao Root diam: 2.80 cm Ao Asc diam:  2.80 cm Stanly Leavens MD Electronically signed by Stanly Leavens MD Signature Date/Time: 05/08/2024/4:04:25 PM    Final    VAS US  LOWER EXTREMITY VENOUS (DVT) (ONLY [facility] & [facility]) Result Date: 05/08/2024  Lower Venous DVT Study Patient Name:  RAPHEL STICKLES  Date of Exam:   05/07/2024 Medical Rec #: [id number]          Accession #:    [id number] Date of Birth: Feb 23, 1966          Patient Gender: M Patient Age:   54 years Exam Location:  [facility] Procedure:      VAS US  LOWER EXTREMITY VENOUS (DVT) Referring Phys: LAMAR PATERSON --------------------------------------------------------------------------------  Indications: Extensive DVT noted in the left lower extremity and IVC by CT.  Risk Factors: Possible antiphospholipid syndrome. Immobility (bed bound at nursing facility). Limitations: Right leg contraction, patient moving leg away from probe, significant abdominal distention and hardness,. Comparison Study: No prior LEV on file Performing Technologist: Alberta Lis RVS  Examination Guidelines: A complete evaluation includes B-mode imaging, spectral Doppler, color Doppler, and power Doppler as needed of all accessible portions of each vessel. Bilateral testing is considered an integral part of a complete examination. Limited examinations for reoccurring indications may be performed as noted. The reflux portion of the exam is performed with the patient in reverse Trendelenburg.  +---------+---------------+---------+-----------+----------+-------------------+ RIGHT     CompressibilityPhasicitySpontaneityPropertiesThrombus Aging      +---------+---------------+---------+-----------+----------+-------------------+ CFV      Full           Yes      Yes                                      +---------+---------------+---------+-----------+----------+-------------------+ SFJ      Full                                                             +---------+---------------+---------+-----------+----------+-------------------+ FV Prox  Full           Yes      Yes                                      +---------+---------------+---------+-----------+----------+-------------------+ FV Mid   Full           Yes      Yes                                      +---------+---------------+---------+-----------+----------+-------------------+  FV DistalFull           Yes      Yes                                      +---------+---------------+---------+-----------+----------+-------------------+ PFV      Full                                                             +---------+---------------+---------+-----------+----------+-------------------+ POP      Full           Yes      Yes                                      +---------+---------------+---------+-----------+----------+-------------------+ PTV                                                   Not well visualized +---------+---------------+---------+-----------+----------+-------------------+ PERO                                                  Not well visualized +---------+---------------+---------+-----------+----------+-------------------+   +---------+---------------+---------+-----------+----------+-------------------+ LEFT     CompressibilityPhasicitySpontaneityPropertiesThrombus Aging      +---------+---------------+---------+-----------+----------+-------------------+ CFV      None           Yes      Yes                  Subacute             +---------+---------------+---------+-----------+----------+-------------------+ SFJ      Partial        Yes      Yes                  Subacute            +---------+---------------+---------+-----------+----------+-------------------+ FV Prox  None           No       No                   Age Indeterminate   +---------+---------------+---------+-----------+----------+-------------------+ FV Mid   None           No       No                   Age Indeterminate   +---------+---------------+---------+-----------+----------+-------------------+ FV DistalNone           No       No                   Age Indeterminate   +---------+---------------+---------+-----------+----------+-------------------+ PFV      None           No       No                   Age Indeterminate   +---------+---------------+---------+-----------+----------+-------------------+  POP      None           No       No                   Age Indeterminate   +---------+---------------+---------+-----------+----------+-------------------+ PTV      None                                         Age Indeterminate   +---------+---------------+---------+-----------+----------+-------------------+ PERO                                                  Not well visualized +---------+---------------+---------+-----------+----------+-------------------+ EIV                                                   appears patent in                                                         this limited exam   +---------+---------------+---------+-----------+----------+-------------------+ Unable to visualize iliacs or IVC secondary to abdominal distention and hardness.    Summary: RIGHT: - There is no evidence of deep vein thrombosis in the lower extremity. However, portions of this examination were limited- see technologist comments above.  - Ultrasound characteristics of enlarged lymph nodes are noted in the  groin.  LEFT: - Findings consistent with subacute deep vein thrombosis involving the left common femoral vein, and SF junction.  - Findings consistent with age indeterminate deep vein thrombosis involving the left femoral vein, left proximal profunda vein, left popliteal vein, and left posterior tibial veins.  - Ultrasound characteristics of enlarged lymph nodes noted in the groin.  *See table(s) above for measurements and observations. Electronically signed by Penne Colorado MD on 05/08/2024 at 3:53:22 PM.    Final    MR Brain W and Wo Contrast Result Date: 05/08/2024 EXAM: MRI BRAIN WITH AND WITHOUT CONTRAST 05/08/2024 04:26:00 AM TECHNIQUE: Multiplanar multisequence MRI of the head/brain was performed with and without the administration of intravenous contrast (10mL gadobutrol  (GADAVIST ) 1 MMOL/ML). COMPARISON: CT head without contrast 05/07/2024. MR head without and with contrast 06/20/2023. MR head without contrast 09/08/2022. CLINICAL HISTORY: Eval lesion seen on CT scan. FINDINGS: BRAIN AND VENTRICLES: Remote right parietal and occipital hematoma is present. Cortical laminar necrosis is present. No acute hemorrhage or underlying mass lesion is present. Chronic confluent periventricular and subcortical white matter T2 hyperintensity is similar to the prior exam. Chronic diffuse atrophy is present. The ventricles are proportional to the degree of atrophy. ORBITS: No acute abnormality. SINUSES: Minimal mucosal thickening is present along the inferior maxillary sinuses bilaterally. BONES AND SOFT TISSUES: Normal bone marrow signal and enhancement. No acute soft tissue abnormality. LIMITATIONS/ARTIFACTS: This study is mildly degraded by patient motion. IMPRESSION: 1. No acute intracranial abnormality. 2. Remote right parietal and occipital hematoma and cortical laminar necrosis. The CT findings are chronic and expected. 3. Chronic confluent periventricular and  subcortical white matter T2 hyperintensity, similar to  prior exam. 4. Chronic diffuse atrophy with proportional ventricles. Electronically signed by: Lonni Necessary MD 05/08/2024 04:42 AM EDT RP Workstation: [id number]   CT Head Wo Contrast Addendum Date: 05/07/2024 ADDENDUM REPORT: 05/07/2024 20:40 ADDENDUM: These results were called by telephone at the time of interpretation on 05/07/2024 at 8:40 pm to provider [facility] , who verbally acknowledged these results. Electronically Signed   By: Morgane  Naveau M.D.   On: 05/07/2024 20:40   Result Date: 05/07/2024 CLINICAL DATA:  eval for ICH EXAM: CT HEAD WITHOUT CONTRAST TECHNIQUE: Contiguous axial images were obtained from the base of the skull through the vertex without intravenous contrast. RADIATION DOSE REDUCTION: This exam was performed according to the departmental dose-optimization program which includes automated exposure control, adjustment of the mA and/or kV according to patient size and/or use of iterative reconstruction technique. COMPARISON:  CT head 06/19/2023 FINDINGS: Brain: Redemonstration of marked bilateral cerebral encephalomalacia. Interval development of hyperdensity conforming to the left occipital gyri. No mass lesion. No extra-axial collection. No mass effect or midline shift. No hydrocephalus. Basilar cisterns are patent. Vascular: No hyperdense vessel. Skull: No acute fracture or focal lesion. Sinuses/Orbits: Paranasal sinuses and mastoid air cells are clear. The orbits are unremarkable. Other: None. IMPRESSION: 1. Interval development of hyperdensity conforming to the left occipital gyri. Finding could be related to an acute hemorrhage. Differential diagnosis includes contrast staining versus infarction with blood brain barrier breakdown. Recommend MRI without contrast for further evaluation. 2. Redemonstration of marked bilateral cerebral encephalomalacia. Electronically Signed: By: Morgane  Naveau M.D. On: 05/07/2024 20:36   CT Angio Chest PE W and/or Wo Contrast Result Date:  05/07/2024 CLINICAL DATA:  Abdominal imaging revealed large DVT involving the IVC, left common iliac, left external iliac, and left common femoral vein. EXAM: CT ANGIOGRAPHY CHEST WITH CONTRAST TECHNIQUE: Multidetector CT imaging of the chest was performed using the standard protocol during bolus administration of intravenous contrast. Multiplanar CT image reconstructions and MIPs were obtained to evaluate the vascular anatomy. RADIATION DOSE REDUCTION: This exam was performed according to the departmental dose-optimization program which includes automated exposure control, adjustment of the mA and/or kV according to patient size and/or use of iterative reconstruction technique. CONTRAST:  75mL OMNIPAQUE  IOHEXOL  350 MG/ML SOLN COMPARISON:  CT abdomen/pelvis from 05/07/2024 FINDINGS: Cardiovascular: Suspected chronic embolus in the right middle lobe pulmonary arterial branches, with very small caliber feeders potentially reflecting mild recanalization chronic embolus for example on images 151-163 of series 10. The right middle lobe demonstrates chronic volume loss and chronic airspace opacity. Suspicion for anterior mural thrombus along the right ventricular outflow tract just proximal to the pulmonic valve, for example on image 101 series 8, consider echocardiographic confirmation. Mediastinum/Nodes: Small type 1 hiatal hernia. Lungs/Pleura: Emphysema. Chronic volume loss and consolidation in the right middle lobe. Mild dependent atelectasis in both lower lobes. Hazy ground-glass density in the lungs, cannot exclude mild edema or alveolitis. No pleural effusion. Upper Abdomen: Prominently dilated upper abdominal large bowel as shown on CT abdomen. Musculoskeletal: Unremarkable Review of the MIP images confirms the above findings. IMPRESSION: 1. Suspected chronic embolus in the right middle lobe pulmonary arterial branches, with very small caliber feeders potentially reflecting mild recanalization of chronic embolus.  This is not a characteristic appearance for acute pulmonary embolus. The right middle lobe demonstrates chronic volume loss and chronic airspace opacity. 2. Suspicion for anterior mural thrombus along the right ventricular outflow tract just proximal to the pulmonic valve. Echocardiography could  be utilized for confirmation. 3. Hazy ground-glass density in the lungs, cannot exclude mild edema or alveolitis. 4. Prominently dilated upper abdominal large bowel as shown on CT abdomen. 5. Small type 1 hiatal hernia. 6.  Emphysema (ICD10-J43.9). Electronically Signed   By: Ryan Salvage M.D.   On: 05/07/2024 17:24   CT ANGIO GI BLEED Addendum Date: 05/07/2024 ADDENDUM REPORT: 05/07/2024 15:16 ADDENDUM: Chest CT recommended to exclude pulmonary embolism. Electronically Signed   By: Vanetta Chou M.D.   On: 05/07/2024 15:16   Result Date: 05/07/2024 CLINICAL DATA:  Abdominal distension and rectal bleeding. EXAM: CTA ABDOMEN AND PELVIS WITHOUT AND WITH CONTRAST TECHNIQUE: Multidetector CT imaging of the abdomen and pelvis was performed using the standard protocol during bolus administration of intravenous contrast. Multiplanar reconstructed images and MIPs were obtained and reviewed to evaluate the vascular anatomy. RADIATION DOSE REDUCTION: This exam was performed according to the departmental dose-optimization program which includes automated exposure control, adjustment of the mA and/or kV according to patient size and/or use of iterative reconstruction technique. CONTRAST:  OMNIPAQUE  IOHEXOL  350 MG/ML SOLN COMPARISON:  CT abdomen pelvis dated 04/13/2024. FINDINGS: VASCULAR Aorta: Mild calcified and noncalcified plaque of the abdominal aorta. No aneurysmal dilatation or dissection. No periaortic fluid collection. Celiac: Patent without evidence of aneurysm, dissection, vasculitis or significant stenosis. SMA: Patent without evidence of aneurysm, dissection, vasculitis or significant stenosis. Renals: Both  renal arteries are patent without evidence of aneurysm, dissection, vasculitis, fibromuscular dysplasia or significant stenosis. IMA: Patent without evidence of aneurysm, dissection, vasculitis or significant stenosis. Inflow: Mild atherosclerotic calcification. No aneurysmal dilatation or dissection. The iliac arteries are patent. Proximal Outflow: The visualized proximal outflow is patent. Veins: Large clot extends from the visualized left femoral and deep femoral vein with proximal extension to the IVC at the level of the renal vein. This has significantly progressed since the prior CT. The SMV, splenic vein, and main portal vein are patent. No portal venous gas. Review of the MIP images confirms the above findings. NON-VASCULAR Lower chest: Diffuse chronic interstitial coarsening. Right middle lobe atelectasis. Pneumonia is not excluded. No intra-abdominal free air or free fluid. Hepatobiliary: The liver is unremarkable. No biliary dilatation. The gallbladder is unremarkable. Pancreas: Unremarkable. No pancreatic ductal dilatation or surrounding inflammatory changes. Spleen: Normal in size without focal abnormality. Adrenals/Urinary Tract: The adrenal glands unremarkable. Multiple nonobstructing bilateral renal calculi measure up to 6 mm. No hydronephrosis or obstructing stone. The visualized ureters and urinary bladder appear unremarkable. Stomach/Bowel: Mild thickened appearance of the rectum may represent proctitis. Clinical correlation recommended. There is loose stool throughout the colon consistent with diarrheal state. Retained oral contrast noted in the colon. Diffuse colonic dilatation similar or slightly progressed since the prior CT consistent with colonic ileus. No evidence of small-bowel obstruction. The appendix is normal. Lymphatic: No adenopathy. Reproductive: The prostate and seminal vesicles are grossly unremarkable. Other: None Musculoskeletal: No acute osseous pathology. IMPRESSION: 1. No  evidence of active GI bleed. 2. Large DVT extends from the left lower extremity veins into the IVC. This has significantly progressed since the prior CT. 3. Colonic ileus. No evidence of small-bowel obstruction. Normal appendix. 4. Multiple nonobstructing bilateral renal calculi measure up to 6 mm. No hydronephrosis or obstructing stone. 5. Right middle lobe atelectasis. Pneumonia is not excluded. These results were called by telephone at the time of interpretation on 05/07/2024 at 3:08 pm to provider [facility] , who verbally acknowledged these results. Electronically Signed: By: Vanetta Chou M.D. On: 05/07/2024 15:09  DG Abd Portable 1V Result Date: 04/14/2024 CLINICAL DATA:  [id number] Ileus [facility]) [id number] EXAM: PORTABLE ABDOMEN - 1 VIEW COMPARISON:  April 13, 2024 FINDINGS: Persistent gaseous dilation of the colon. Extent of dilation is favored similar to minimally improved since prior CT. Evaluation for free air is limited due to supine positioning. Bilateral nonobstructing nephrolithiasis. IMPRESSION: Persistent gaseous dilation of the colon consistent with reported colonic ileus. Extent of dilation is favored similar to minimally improved since prior CT. Electronically Signed   By: Corean Salter M.D.   On: 04/14/2024 13:32   CT ABDOMEN PELVIS W CONTRAST Result Date: 04/13/2024 CLINICAL DATA:  Acute abdominal pain, acute nonlocalized. History of colonic ileus EXAM: CT ABDOMEN AND PELVIS WITH CONTRAST TECHNIQUE: Multidetector CT imaging of the abdomen and pelvis was performed using the standard protocol following bolus administration of intravenous contrast. RADIATION DOSE REDUCTION: This exam was performed according to the departmental dose-optimization program which includes automated exposure control, adjustment of the mA and/or kV according to patient size and/or use of iterative reconstruction technique. CONTRAST:  OMNIPAQUE  IOHEXOL  300 MG/ML  SOLN COMPARISON:  CT 03/06/2024 FINDINGS: Lower  chest: Segmental consolidation lateral aspect of the RIGHT lower lobe (image 34/series 5 is new finding from prior. Hepatobiliary: No focal hepatic lesion. Normal gallbladder. No biliary duct dilatation. Common bile duct is normal. Pancreas: Pancreas is normal. No ductal dilatation. No pancreatic inflammation. Spleen: Normal spleen Adrenals/urinary tract: Adrenal glands normal. Nonobstructing calculi within the kidneys. Ureters and bladder normal. Stomach/Bowel: Stomach is decompressed. The small bowel is decompressed. No evidence small bowel obstruction. Terminal ileum is normal. Appendix normal. The ascending and transverse colon is gas-filled and distended. Transverse colon measures 7.5 cm diameter. There is fluid stool in the descending colon and rectosigmoid colon. The LEFT colon is ahaustral. Findings are similar to comparison exam with mild increase in gaseous distension. No pneumatosis. No intraperitoneal free air Vascular/Lymphatic: Abdominal aorta is normal caliber. No periportal or retroperitoneal adenopathy. No pelvic adenopathy. Reproductive: Unremarkable Other: No free fluid. Musculoskeletal: No aggressive osseous lesion. IMPRESSION: 1. Persistent colonic ileus pattern. Gaseous distention of the colon slightly increased. Fluid stool in the a ahaustral left colon. 2. New airspace disease in lateral segment of the RIGHT lower lobe suggest pneumonia. Electronically Signed   By: Jackquline Boxer M.D.   On: 04/13/2024 15:34     TODAY-DAY OF DISCHARGE:  Subjective:   Garren Greenman today has no headache,no chest abdominal pain,no new weakness tingling or numbness, feels much better wants to go home today.   Objective:   Blood pressure 99/71, pulse 89, temperature 98.1 F (36.7 C), temperature source Oral, resp. rate 18, height 5' 6 (1.676 m), weight 67.9 kg, SpO2 96%.  Intake/Output Summary (Last 24 hours) at 05/11/2024 0754 Last data filed at 05/11/2024 0658 Gross per 24 hour  Intake --   Output 2450 ml  Net -2450 ml   Filed Weights   05/08/24 0600  Weight: 67.9 kg    Exam: Awake Alert, Oriented *3, No new F.N deficits, Normal affect [place].AT,PERRAL Supple Neck,No JVD, No cervical lymphadenopathy appriciated.  Symmetrical Chest wall movement, Good air movement bilaterally, CTAB RRR,No Gallops,Rubs or new Murmurs, No Parasternal Heave +ve B.Sounds, Abd Soft, Non tender, No organomegaly appriciated, No rebound -guarding or rigidity. No Cyanosis, Clubbing or edema, No new Rash or bruise   PERTINENT RADIOLOGIC STUDIES: No results found.    PERTINENT LAB RESULTS: CBC: Recent Labs    05/08/24 2354 05/10/24 0620  WBC 8.6 6.8  HGB  12.3* 12.4*  HCT 37.2* 37.1*  PLT 187 201   CMET CMP     Component Value Date/Time   NA 140 05/10/2024 2052   NA 140 01/11/2022 1121   K 3.1 (L) 05/10/2024 2052   CL 114 (H) 05/10/2024 2052   CO2 16 (L) 05/10/2024 2052   GLUCOSE 103 (H) 05/10/2024 2052   BUN 5 (L) 05/10/2024 2052   BUN 5 (L) 01/11/2022 1121   CREATININE 1.19 05/10/2024 2052   CREATININE 1.24 04/18/2017 1012   CALCIUM  8.2 (L) 05/10/2024 2052   PROT 7.9 05/07/2024 1308   PROT 7.6 01/11/2022 1121   ALBUMIN 3.3 (L) 05/07/2024 1308   ALBUMIN 4.2 01/11/2022 1121   AST 16 05/07/2024 1308   ALT 16 05/07/2024 1308   ALKPHOS 141 (H) 05/07/2024 1308   BILITOT 0.7 05/07/2024 1308   BILITOT 0.4 01/11/2022 1121   EGFR 80 01/11/2022 1121   GFRNONAA >60 05/10/2024 2052   GFRNONAA 66 04/18/2017 1012    GFR Estimated Creatinine Clearance: 59.6 mL/min (by C-G formula based on SCr of 1.19 mg/dL). No results for input(s): LIPASE, AMYLASE in the last 72 hours. No results for input(s): CKTOTAL, CKMB, CKMBINDEX, TROPONINI in the last 72 hours. Invalid input(s): POCBNP No results for input(s): DDIMER in the last 72 hours. No results for input(s): HGBA1C in the last 72 hours. No results for input(s): CHOL, HDL, LDLCALC, TRIG, CHOLHDL,  LDLDIRECT in the last 72 hours. No results for input(s): TSH, T4TOTAL, T3FREE, THYROIDAB in the last 72 hours.  Invalid input(s): FREET3 No results for input(s): VITAMINB12, FOLATE, FERRITIN, TIBC, IRON, RETICCTPCT in the last 72 hours. Coags: No results for input(s): INR in the last 72 hours.  Invalid input(s): PT Microbiology: No results found for this or any previous visit (from the past 240 hours).  FURTHER DISCHARGE INSTRUCTIONS:  Get Medicines reviewed and adjusted: Please take all your medications with you for your next visit with your Primary MD  Laboratory/radiological data: Please request your Primary MD to go over all hospital tests and procedure/radiological results at the follow up, please ask your Primary MD to get all Hospital records sent to his/her office.  In some cases, they will be blood work, cultures and biopsy results pending at the time of your discharge. Please request that your primary care M.D. goes through all the records of your hospital data and follows up on these results.  Also Note the following: If you experience worsening of your admission symptoms, develop shortness of breath, life threatening emergency, suicidal or homicidal thoughts you must seek medical attention immediately by calling 911 or calling your MD immediately  if symptoms less severe.  You must read complete instructions/literature along with all the possible adverse reactions/side effects for all the Medicines you take and that have been prescribed to you. Take any new Medicines after you have completely understood and accpet all the possible adverse reactions/side effects.   Do not drive when taking Pain medications or sleeping medications (Benzodaizepines)  Do not take more than prescribed Pain, Sleep and Anxiety Medications. It is not advisable to combine anxiety,sleep and pain medications without talking with your primary care practitioner  Special  Instructions: If you have smoked or chewed Tobacco  in the last 2 yrs please stop smoking, stop any regular Alcohol  and or any Recreational drug use.  Wear Seat belts while driving.  Please note: You were cared for by a hospitalist during your hospital stay. Once you are discharged, your primary care  physician will handle any further medical issues. Please note that NO REFILLS for any discharge medications will be authorized once you are discharged, as it is imperative that you return to your primary care physician (or establish a relationship with a primary care physician if you do not have one) for your post hospital discharge needs so that they can reassess your need for medications and monitor your lab values.  Total Time spent coordinating discharge including counseling, education and face to face time equals greater than 30 minutes.  SignedBETHA Donalda Applebaum 05/11/2024 7:54 AM

## 2024-05-10 NOTE — Plan of Care (Signed)

## 2024-05-11 ENCOUNTER — Ambulatory Visit: Admitting: Physician Assistant

## 2024-05-11 DIAGNOSIS — I82422 Acute embolism and thrombosis of left iliac vein: Secondary | ICD-10-CM | POA: Diagnosis not present

## 2024-05-11 DIAGNOSIS — K567 Ileus, unspecified: Secondary | ICD-10-CM | POA: Diagnosis not present

## 2024-05-11 DIAGNOSIS — K625 Hemorrhage of anus and rectum: Secondary | ICD-10-CM | POA: Diagnosis not present

## 2024-05-11 MED ORDER — POTASSIUM CHLORIDE 10 MEQ/100ML IV SOLN
10.0000 meq | INTRAVENOUS | Status: AC
  Administered 2024-05-11 (×3): 10 meq via INTRAVENOUS
  Filled 2024-05-11 (×3): qty 100

## 2024-05-11 MED ORDER — POTASSIUM CHLORIDE 20 MEQ PO PACK
40.0000 meq | PACK | Freq: Once | ORAL | Status: AC
  Administered 2024-05-11: 40 meq via ORAL
  Filled 2024-05-11: qty 2

## 2024-05-11 NOTE — Progress Notes (Signed)
 Called [facility] to give report. No answer

## 2024-05-11 NOTE — Care Management Important Message (Signed)
 Important Message  Patient Details  Name: Daniel Stuart MRN: [id number] Date of Birth: July 02, 1966   Important Message Given:  Yes - Medicare IM     Claretta Deed 05/11/2024, 4:33 PM

## 2024-05-11 NOTE — Progress Notes (Signed)
 Daily Progress Note   Patient Name: ANIKET PAYE       Date: 05/11/2024 DOB: 09-19-1966  Age: 58 y.o. MRN#: [id number] Attending Physician: Raenelle Donalda HERO, MD Primary Care Physician: Paseda, Folashade R, [name] Admit Date: 05/07/2024  Reason for Consultation/Follow-up: Establishing goals of care  Length of Stay: 3  Current Medications: Scheduled Meds:   apixaban   5 mg Oral BID   cyanocobalamin   1,000 mcg Oral Daily   gabapentin   300 mg Oral BID   midodrine   2.5 mg Oral TID WC   pantoprazole   40 mg Oral QHS   polyethylene glycol  17 g Oral BID   senna-docusate  2 tablet Oral QHS   simethicone   80 mg Oral TID AC & HS   sodium bicarbonate   650 mg Oral BID    Continuous Infusions:  potassium chloride  10 mEq (05/11/24 0937)    PRN Meds: acetaminophen  **OR** acetaminophen , albuterol , bisacodyl , oxyCODONE   Physical Exam Vitals reviewed.  Constitutional:      General: He is not in acute distress. HENT:     Head: Normocephalic and atraumatic.     Mouth/Throat:     Mouth: Mucous membranes are dry.  Cardiovascular:     Rate and Rhythm: Normal rate.  Pulmonary:     Effort: Pulmonary effort is normal.  Skin:    General: Skin is warm and dry.  Neurological:     Mental Status: He is alert.     Comments: Oriented to person, place, and situation but not time  Psychiatric:        Behavior: Behavior normal.             Vital Signs: BP 99/71 (BP Location: Right Arm)   Pulse 89   Temp 98 F (36.7 C) (Oral)   Resp 18   Ht 5' 6 (1.676 m)   Wt 67.9 kg Comment: wt from 04/13/2024  SpO2 96%   BMI 24.16 kg/m  SpO2: SpO2: 96 % O2 Device: O2 Device: Room Air O2 Flow Rate:      Patient Active Problem List   Diagnosis Date Noted   Acute DVT (deep venous thrombosis) (HCC)  05/08/2024   airspace disease 04/13/2024   Prolonged QT interval 04/13/2024   Ileus (HCC) 03/05/2024   Edema of right upper arm 02/28/2024   Protein calorie  malnutrition (HCC) 02/27/2024   Pressure injury of skin 02/24/2024   Colonic pseudoobstruction 02/23/2024   Colitis 02/23/2024   Hypokalemia 02/23/2024   History of intracranial hemorrhage 02/23/2024   Essential hypertension 02/23/2024   Antiphospholipid syndrome (HCC) 02/23/2024   Left carotid artery occlusion 09/09/2022   Weakness of both lower extremities 09/07/2022   Bedbound 08/25/2022   PAD (peripheral artery disease) (HCC) 08/25/2022   Arrhythmia as indication for cardiac pacemaker replacement 03/11/2020   Vitamin D  deficiency 09/13/2019   History of intracranial hemorrhage History of severe encephalopathy History of CVA/TIA/progressive leukoencephalopathy and chronic left ICA stenosis 03/12/2019   Right sided weakness 03/12/2019   Anxiety 03/12/2019   PVD (peripheral vascular disease) (HCC) 04/12/2017   Carotid arterial disease (HCC) 04/12/2017    Palliative Care Assessment & Plan   Patient Profile: 58 y.o. male  with past medical history of ICH with residual right sided weakness (bedbound at baseline), aggressive leukoencephalopathy, PAD/left ICA stenosis, cortical blindness, HTN, HLD, antiphospholipid syndrome, persistent colonic ileus, s/p PPM admitted on 05/07/2024 from [facility] with abd distention and blood in stool - no active bleeding identified off of anticoagulation since 2024 due to ICH.    Today's Discussion: Chart reviewed. Patient sitting up in bed with television on. He is alert and oriented to person, location, and situation-- He is not oriented to time but cannot see to read the clock/board for this information.He appears comfortable. Does not report pain. He does request a ginger ale. He shared that he has been living in SNF prior to admission and his sister helps him with healthcare decision making. I  tell him that I plan to call his sister to see if there is a time we can discuss GOC.   Discharge orders placed by attending team. Patient to discharge back to SNF. Attempted to call patient's sister [name] to discuss whether they were interested in outpatient palliative. No vmail set up. No further PMT needs.   Recommendations/Plan: Full code Full scope Patient discharging today to SNF  Code Status:    Code Status Orders  (From admission, onward)           Start     Ordered   05/08/24 0745  Full code  Continuous       Question:  By:  Answer:  Consent: discussion documented in EHR   05/08/24 0746         Extensive chart review has been completed prior to seeing the patient including labs, vital signs, imaging, progress/consult notes, orders, medications, and available advance directive documents.  Care plan was discussed with Dr. Raenelle  Time spent: 25 minutes  Thank you for allowing the Palliative Medicine Team to assist in the care of this patient.    Stephane CHRISTELLA Palin, NP  Please contact Palliative Medicine Team phone at [phone] for questions and concerns.

## 2024-05-11 NOTE — TOC Transition Note (Signed)
 Transition of Care [facility]) - Discharge Note   Patient Details  Name: Daniel Stuart MRN: [id number] Date of Birth: 1966-06-29  Transition of Care [facility]) CM/SW Contact:  [name], LCSW Phone Number: 05/11/2024, 12:36 PM   Clinical Narrative:    Patient will DC to: [facility] Anticipated DC date: 05/11/2024 Family notified: Yes Transport by: [facility]   Per MD patient ready for DC to . RN to call report prior to discharge [phone] . RN, patient, patient's family, and facility notified of DC. Discharge Summary and FL2 sent to facility. DC packet on chart. Ambulance transport requested for patient.   CSW will sign off for now as social work intervention is no longer needed. Please consult us  again if new needs arise.     Final next level of care: Skilled Nursing Facility     Patient Goals and CMS Choice            Discharge Placement   Existing PASRR number confirmed : 05/11/24          Patient chooses bed at: [facility] Patient to be transferred to facility by: [facility] Name of family member notified: Jasmine Patient and family notified of of transfer: 05/11/24  Discharge Plan and Services Additional resources added to the After Visit Summary for                                       Social Drivers of Health (SDOH) Interventions SDOH Screenings   Food Insecurity: No Food Insecurity (05/08/2024)  Housing: Low Risk  (05/08/2024)  Transportation Needs: No Transportation Needs (04/13/2024)  Utilities: Not At Risk (05/08/2024)  Alcohol Screen: Low Risk  (07/31/2021)  Depression (PHQ2-9): Low Risk  (04/13/2022)  Financial Resource Strain: Medium Risk (07/31/2021)  Physical Activity: Inactive (07/31/2021)  Social Connections: Socially Isolated (07/31/2021)  Stress: No Stress Concern Present (07/31/2021)  Tobacco Use: High Risk (05/07/2024)     Readmission Risk Interventions    04/16/2024   10:19 AM  Readmission Risk Prevention Plan   Transportation Screening Complete  PCP or Specialist Appt within 5-7 Days Complete  Home Care Screening Complete  Medication Review (RN CM) Complete

## 2024-05-11 NOTE — Progress Notes (Signed)
 Called [facility] x2 to give report. Call not received.

## 2024-05-11 NOTE — Progress Notes (Signed)
 Called to [facility] to give report. Was informed that they would call me back in 20 minutes.

## 2024-05-11 NOTE — Plan of Care (Signed)
 progressing

## 2024-06-18 ENCOUNTER — Emergency Department (HOSPITAL_COMMUNITY)

## 2024-06-18 ENCOUNTER — Observation Stay (HOSPITAL_COMMUNITY)

## 2024-06-18 ENCOUNTER — Inpatient Hospital Stay (HOSPITAL_COMMUNITY)
Admission: EM | Admit: 2024-06-18 | Discharge: 2024-06-21 | DRG: 064 | Disposition: A | Discharge status: Skilled Nursing Facility | Source: Skilled Nursing Facility | Attending: Internal Medicine | Admitting: Internal Medicine

## 2024-06-18 ENCOUNTER — Other Ambulatory Visit: Payer: Self-pay

## 2024-06-18 DIAGNOSIS — Z79899 Other long term (current) drug therapy: Secondary | ICD-10-CM

## 2024-06-18 DIAGNOSIS — D6861 Antiphospholipid syndrome: Secondary | ICD-10-CM | POA: Diagnosis present

## 2024-06-18 DIAGNOSIS — E876 Hypokalemia: Secondary | ICD-10-CM | POA: Diagnosis not present

## 2024-06-18 DIAGNOSIS — G629 Polyneuropathy, unspecified: Secondary | ICD-10-CM | POA: Diagnosis present

## 2024-06-18 DIAGNOSIS — G9389 Other specified disorders of brain: Secondary | ICD-10-CM | POA: Diagnosis present

## 2024-06-18 DIAGNOSIS — Z8673 Personal history of transient ischemic attack (TIA), and cerebral infarction without residual deficits: Secondary | ICD-10-CM

## 2024-06-18 DIAGNOSIS — F419 Anxiety disorder, unspecified: Secondary | ICD-10-CM | POA: Diagnosis present

## 2024-06-18 DIAGNOSIS — I69351 Hemiplegia and hemiparesis following cerebral infarction affecting right dominant side: Secondary | ICD-10-CM

## 2024-06-18 DIAGNOSIS — I63511 Cerebral infarction due to unspecified occlusion or stenosis of right middle cerebral artery: Secondary | ICD-10-CM | POA: Diagnosis not present

## 2024-06-18 DIAGNOSIS — R627 Adult failure to thrive: Secondary | ICD-10-CM | POA: Diagnosis present

## 2024-06-18 DIAGNOSIS — Z515 Encounter for palliative care: Secondary | ICD-10-CM

## 2024-06-18 DIAGNOSIS — Z8661 Personal history of infections of the central nervous system: Secondary | ICD-10-CM

## 2024-06-18 DIAGNOSIS — G9349 Other encephalopathy: Secondary | ICD-10-CM | POA: Diagnosis present

## 2024-06-18 DIAGNOSIS — L89613 Pressure ulcer of right heel, stage 3: Secondary | ICD-10-CM | POA: Diagnosis present

## 2024-06-18 DIAGNOSIS — H538 Other visual disturbances: Secondary | ICD-10-CM | POA: Diagnosis present

## 2024-06-18 DIAGNOSIS — I10 Essential (primary) hypertension: Secondary | ICD-10-CM | POA: Diagnosis present

## 2024-06-18 DIAGNOSIS — Z7901 Long term (current) use of anticoagulants: Secondary | ICD-10-CM

## 2024-06-18 DIAGNOSIS — Z833 Family history of diabetes mellitus: Secondary | ICD-10-CM

## 2024-06-18 DIAGNOSIS — E870 Hyperosmolality and hypernatremia: Secondary | ICD-10-CM | POA: Diagnosis present

## 2024-06-18 DIAGNOSIS — I639 Cerebral infarction, unspecified: Secondary | ICD-10-CM | POA: Diagnosis present

## 2024-06-18 DIAGNOSIS — Z8249 Family history of ischemic heart disease and other diseases of the circulatory system: Secondary | ICD-10-CM

## 2024-06-18 DIAGNOSIS — F0150 Vascular dementia without behavioral disturbance: Secondary | ICD-10-CM | POA: Diagnosis present

## 2024-06-18 DIAGNOSIS — Z7401 Bed confinement status: Secondary | ICD-10-CM

## 2024-06-18 DIAGNOSIS — Z1152 Encounter for screening for COVID-19: Secondary | ICD-10-CM

## 2024-06-18 DIAGNOSIS — Z86718 Personal history of other venous thrombosis and embolism: Secondary | ICD-10-CM

## 2024-06-18 DIAGNOSIS — R1310 Dysphagia, unspecified: Secondary | ICD-10-CM | POA: Diagnosis present

## 2024-06-18 DIAGNOSIS — K5981 Ogilvie syndrome: Secondary | ICD-10-CM | POA: Diagnosis present

## 2024-06-18 DIAGNOSIS — L89312 Pressure ulcer of right buttock, stage 2: Secondary | ICD-10-CM | POA: Diagnosis present

## 2024-06-18 DIAGNOSIS — R54 Age-related physical debility: Secondary | ICD-10-CM | POA: Diagnosis present

## 2024-06-18 DIAGNOSIS — Z716 Tobacco abuse counseling: Secondary | ICD-10-CM

## 2024-06-18 DIAGNOSIS — F17210 Nicotine dependence, cigarettes, uncomplicated: Secondary | ICD-10-CM | POA: Diagnosis present

## 2024-06-18 DIAGNOSIS — R29810 Facial weakness: Secondary | ICD-10-CM | POA: Diagnosis present

## 2024-06-18 DIAGNOSIS — Z825 Family history of asthma and other chronic lower respiratory diseases: Secondary | ICD-10-CM

## 2024-06-18 DIAGNOSIS — Z8679 Personal history of other diseases of the circulatory system: Secondary | ICD-10-CM

## 2024-06-18 DIAGNOSIS — K567 Ileus, unspecified: Secondary | ICD-10-CM | POA: Diagnosis present

## 2024-06-18 LAB — COMPREHENSIVE METABOLIC PANEL WITH GFR
ALT: 12 U/L (ref 0–44)
AST: 17 U/L (ref 15–41)
Albumin: 2.5 g/dL — ABNORMAL LOW (ref 3.5–5.0)
Alkaline Phosphatase: 99 U/L (ref 38–126)
Anion gap: 10 (ref 5–15)
BUN: 19 mg/dL (ref 6–20)
CO2: 24 mmol/L (ref 22–32)
Calcium: 8.6 mg/dL — ABNORMAL LOW (ref 8.9–10.3)
Chloride: 113 mmol/L — ABNORMAL HIGH (ref 98–111)
Creatinine, Ser: 1.31 mg/dL — ABNORMAL HIGH (ref 0.61–1.24)
GFR, Estimated: >60 mL/min (ref >60)
Glucose, Bld: 101 mg/dL — ABNORMAL HIGH (ref 70–99)
Potassium: 2.4 mmol/L — CL (ref 3.5–5.1)
Sodium: 147 mmol/L — ABNORMAL HIGH (ref 135–145)
Total Bilirubin: 0.5 mg/dL (ref 0.0–1.2)
Total Protein: 6.5 g/dL (ref 6.5–8.1)

## 2024-06-18 LAB — CBC WITH DIFFERENTIAL/PLATELET
Abs Immature Granulocytes: 0.03 K/uL (ref 0.00–0.07)
Basophils Absolute: 0 K/uL (ref 0.0–0.1)
Basophils Relative: 0 %
Eosinophils Absolute: 0.1 K/uL (ref 0.0–0.5)
Eosinophils Relative: 1 %
HCT: 39.8 % (ref 39.0–52.0)
Hemoglobin: 12.8 g/dL — ABNORMAL LOW (ref 13.0–17.0)
Immature Granulocytes: 0 %
Lymphocytes Relative: 23 %
Lymphs Abs: 1.9 K/uL (ref 0.7–4.0)
MCH: 27.3 pg (ref 26.0–34.0)
MCHC: 32.2 g/dL (ref 30.0–36.0)
MCV: 84.9 fL (ref 80.0–100.0)
Monocytes Absolute: 0.6 K/uL (ref 0.1–1.0)
Monocytes Relative: 7 %
Neutro Abs: 5.9 K/uL (ref 1.7–7.7)
Neutrophils Relative %: 69 %
Platelets: 254 K/uL (ref 150–400)
RBC: 4.69 MIL/uL (ref 4.22–5.81)
RDW: 16.2 % — ABNORMAL HIGH (ref 11.5–15.5)
WBC: 8.6 K/uL (ref 4.0–10.5)
nRBC: 0 % (ref 0.0–0.2)

## 2024-06-18 LAB — RESP PANEL BY RT-PCR (RSV, FLU A&B, COVID)  RVPGX2
Influenza A by PCR: NEGATIVE
Influenza B by PCR: NEGATIVE
Resp Syncytial Virus by PCR: NEGATIVE
SARS Coronavirus 2 by RT PCR: NEGATIVE

## 2024-06-18 LAB — AMMONIA: Ammonia: <13 umol/L (ref 9–35)

## 2024-06-18 MED ORDER — POTASSIUM CHLORIDE 20 MEQ PO PACK
40.0000 meq | PACK | Freq: Two times a day (BID) | ORAL | Status: DC
  Administered 2024-06-18 – 2024-06-19 (×2): 40 meq via ORAL
  Filled 2024-06-18 (×2): qty 2

## 2024-06-18 MED ORDER — SODIUM CHLORIDE 0.9 % IV BOLUS
1000.0000 mL | Freq: Once | INTRAVENOUS | Status: AC
  Administered 2024-06-18: 1000 mL via INTRAVENOUS

## 2024-06-18 MED ORDER — BISACODYL 10 MG RE SUPP: 10.0000 mg | Freq: Every day | RECTAL | Status: DC | PRN

## 2024-06-18 MED ORDER — SODIUM CHLORIDE 0.9% FLUSH: 3.0000 mL | INTRAVENOUS | Status: DC | PRN

## 2024-06-18 MED ORDER — POTASSIUM CHLORIDE 10 MEQ/100ML IV SOLN
10.0000 meq | INTRAVENOUS | Status: AC
  Administered 2024-06-18 – 2024-06-19 (×4): 10 meq via INTRAVENOUS
  Filled 2024-06-18 (×4): qty 100

## 2024-06-18 MED ORDER — JUVEN PO PACK
1.0000 | PACK | Freq: Two times a day (BID) | ORAL | Status: DC
  Administered 2024-06-19 – 2024-06-21 (×4): 1 via ORAL
  Filled 2024-06-18 (×4): qty 1

## 2024-06-18 MED ORDER — ACETAMINOPHEN 325 MG PO TABS
650.0000 mg | ORAL_TABLET | Freq: Four times a day (QID) | ORAL | Status: DC | PRN
  Administered 2024-06-20: 650 mg via ORAL
  Filled 2024-06-18: qty 2

## 2024-06-18 MED ORDER — APIXABAN 5 MG PO TABS
5.0000 mg | ORAL_TABLET | Freq: Two times a day (BID) | ORAL | Status: DC
  Administered 2024-06-18: 5 mg via ORAL
  Filled 2024-06-18: qty 1

## 2024-06-18 MED ORDER — IPRATROPIUM-ALBUTEROL 0.5-2.5 (3) MG/3ML IN SOLN: 3.0000 mL | Freq: Four times a day (QID) | RESPIRATORY_TRACT | Status: DC | PRN

## 2024-06-18 MED ORDER — VITAMIN B-12 1000 MCG PO TABS
1000.0000 ug | ORAL_TABLET | Freq: Every day | ORAL | Status: DC
  Administered 2024-06-20 – 2024-06-21 (×2): 1000 ug via ORAL
  Filled 2024-06-18 (×2): qty 1

## 2024-06-18 MED ORDER — POTASSIUM CHLORIDE 10 MEQ/100ML IV SOLN
10.0000 meq | INTRAVENOUS | Status: AC
  Administered 2024-06-18 (×2): 10 meq via INTRAVENOUS
  Filled 2024-06-18 (×3): qty 100

## 2024-06-18 MED ORDER — SIMETHICONE 80 MG PO CHEW
80.0000 mg | CHEWABLE_TABLET | Freq: Four times a day (QID) | ORAL | Status: DC
  Administered 2024-06-19 – 2024-06-21 (×6): 80 mg via ORAL
  Filled 2024-06-18 (×6): qty 1

## 2024-06-18 MED ORDER — IOHEXOL 350 MG/ML SOLN
75.0000 mL | Freq: Once | INTRAVENOUS | Status: AC | PRN
  Administered 2024-06-18: 75 mL via INTRAVENOUS

## 2024-06-18 MED ORDER — RISAQUAD PO CAPS
2.0000 | ORAL_CAPSULE | Freq: Every day | ORAL | Status: DC
  Administered 2024-06-20 – 2024-06-21 (×2): 2 via ORAL
  Filled 2024-06-18 (×2): qty 2

## 2024-06-18 MED ORDER — POTASSIUM CHLORIDE 10 MEQ/100ML IV SOLN
10.0000 meq | INTRAVENOUS | Status: AC
  Administered 2024-06-18: 10 meq via INTRAVENOUS

## 2024-06-18 MED ORDER — PANTOPRAZOLE SODIUM 40 MG PO TBEC
40.0000 mg | DELAYED_RELEASE_TABLET | Freq: Every day | ORAL | Status: DC
  Administered 2024-06-18 – 2024-06-20 (×3): 40 mg via ORAL
  Filled 2024-06-18 (×3): qty 1

## 2024-06-18 MED ORDER — SODIUM CHLORIDE 0.9 % IV SOLN: 250.0000 mL | INTRAVENOUS | Status: AC | PRN

## 2024-06-18 MED ORDER — SODIUM CHLORIDE 0.9% FLUSH
3.0000 mL | Freq: Two times a day (BID) | INTRAVENOUS | Status: DC
  Administered 2024-06-18 – 2024-06-20 (×5): 3 mL via INTRAVENOUS

## 2024-06-18 MED ORDER — SODIUM CHLORIDE 0.9 % IV SOLN: INTRAVENOUS | Status: AC

## 2024-06-18 MED ORDER — GADOBUTROL 1 MMOL/ML IV SOLN
6.0000 mL | Freq: Once | INTRAVENOUS | Status: AC | PRN
  Administered 2024-06-18: 6 mL via INTRAVENOUS

## 2024-06-18 MED ORDER — MAGNESIUM SULFATE 2 GM/50ML IV SOLN
2.0000 g | Freq: Once | INTRAVENOUS | Status: AC
  Administered 2024-06-18: 2 g via INTRAVENOUS
  Filled 2024-06-18: qty 50

## 2024-06-18 MED ORDER — ONDANSETRON HCL 4 MG PO TABS: 4.0000 mg | ORAL_TABLET | Freq: Four times a day (QID) | ORAL | Status: DC | PRN

## 2024-06-18 MED ORDER — GABAPENTIN 300 MG PO CAPS
300.0000 mg | ORAL_CAPSULE | Freq: Two times a day (BID) | ORAL | Status: DC
  Administered 2024-06-19 – 2024-06-21 (×4): 300 mg via ORAL
  Filled 2024-06-18 (×4): qty 1

## 2024-06-18 NOTE — ED Provider Notes (Signed)
 58 yo male here with complaint of vision change, blurred vision Medical history notes cortical blindness and occipital hematoma and infarct in July 2025   Hx of ascites, abdomen distended  Physical Exam  BP 121/88   Pulse 86   Temp 98.5 F (36.9 C) (Oral)   Resp 14   Ht 5' 6 (1.676 m)   Wt 68 kg   SpO2 100%   BMI 24.20 kg/m   Physical Exam  Procedures  .Critical Care  Performed by: Cottie Donnice PARAS, MD Authorized by: Cottie Donnice PARAS, MD   Critical care provider statement:    Critical care time (minutes):  30   Critical care time was exclusive of:  Separately billable procedures and treating other patients   Critical care was necessary to treat or prevent imminent or life-threatening deterioration of the following conditions:  Circulatory failure   Critical care was time spent personally by me on the following activities:  Ordering and performing treatments and interventions, ordering and review of laboratory studies, ordering and review of radiographic studies, pulse oximetry, review of old charts, examination of patient and evaluation of patient's response to treatment   Care discussed with: admitting provider   Comments:     IV K repletion   ED Course / MDM   Clinical Course as of 06/18/24 2325  Mon Jun 18, 2024  1758 Admitted to hospitalist - MRI brain ordered [MT]    Clinical Course User Index [MT] Cottie Donnice PARAS, MD   Medical Decision Making Amount and/or Complexity of Data Reviewed Labs: ordered. Radiology: ordered.  Risk Prescription drug management. Decision regarding hospitalization.   Patient is very poor historian with difficult ocular exam, tracks to motion but does not follow commands.  He speaks very slowly and seems to have a difficult time understanding what I'm saying to him.  Roughly speaking his visual bluriness is even in both eyes but he will not or cannot read visual acuity chart.    We are pending CT imaging, likely may need to  proceed with Mri to evaluate posterior brain.  He is outside the acute stroke window for tnk or thrombectomy.  My suspicion is that this is related to chronic hematoma or infarct, but difficult to discern.  Labs reviewed - Na 147, K 2.4, alb 2.5   CT imaging reviewed - continued ileus appearance (chronic likely per review of prior hospitalization records)  IVC and left femoral thrombus seen on prior imaging or noted on prior hospital discharge summary - likely related to APS.  Will continue on Eliquis  for now.  IV K repletion - plan to admit for this issue.  Inpatient team might consider repeat MRI; difficult to discern if bilateral vision change is truly new or related to prior stroke and progressive leukoencephalomalacia.      Cottie Donnice PARAS, MD 06/18/24 2325

## 2024-06-18 NOTE — ED Triage Notes (Signed)
 Patient bib EMS from [place] with complaints of visual changes. EMS states that this is a recurrent issue and that he was able to track their fingers. Patient states it is just blurry and that he is unsure when it started.

## 2024-06-18 NOTE — ED Notes (Signed)
 Pt had a BM. Cleaned him up, replaced bedding, and placed a new brief on.

## 2024-06-18 NOTE — H&P (Addendum)
 [facility] History and Physical  ACY ORSAK FMW:[id number] DOB: 1965-11-11 DOA: 06/18/2024  Referring physician: ED  PCP: Paseda, Folashade R, [name]   Patient is coming from: Skilled nursing facility  Chief Complaint: Blurred vision  HPI: Patient is a 58 years old male with past medical history of intracranial hemorrhage venous thromboembolism, presented hospital with complaints of blurry vision.  Patient extremely poor historian and not quite sure what was going on but states that he had more blurriness over the last 24 hours.  Unsure of whether it is in 1 side or both.  Denies any headache, nausea, vomiting, head trauma.  Denies any fever.  Denies any shortness of breath or dyspnea.  Denies any nausea or vomiting but has distended abdomen like before.    In the ED, labs were notable for no leukocytosis.  BMP showed significant hypokalemia with potassium of 2.4 and sodium elevated at 147.  Creatinine slightly elevated at 1.3.  Ammonia less than 39.  CT head scan showed no acute intracranial process, stable cortical atrophy encephalomalacia and chronic ischemic changes.  Patient was then considered for  observation  due to significant electrolyte imbalance and concerns for blurriness of the eyes.  Assessment and Plan Principal Problem:   Hypokalemia Active Problems:   Colonic pseudoobstruction   History of intracranial hemorrhage   History of intracranial hemorrhage History of severe encephalopathy History of CVA/TIA/progressive leukoencephalopathy and chronic left ICA stenosis   Antiphospholipid syndrome (HCC)   Essential hypertension  Blurry vision. Uncertain etiology but has history of intracranial hemorrhage, occipital lesions which could be contributing to it.  Had a history of blurry vision in last admission.  Patient might benefit from ophthalmology evaluation as outpatient to rule out intraocular conditions as well.  Patient is not a good antithrombotic candidate since he  is on Eliquis .  Will follow MRI brain that has been ordered.  Unlikely that any intervention could be done.  History of multiple CVA with progressive leukoencephalopathy.  Poor historian.  Uncertain about the blurriness of the eyes.  CT head scan with chronic atrophic findings.  No acute findings.  MRI brain has been ordered from the ED will follow  Significant hypokalemia.  Initial potassium of 2.4.  Will aggressively replace.  Will replace with 70 mEq of IV potassium today. Check levels in AM.   Mild hypernatremia.  Continue with IV hydration. Check BMP in AM.   History of chronic pseudoobstruction.  CT scan done showed similar findings as previous scan..  Has distended abdomen.  Will aggressively replenish potassium levels.  No abdominal issues as per the patient.  Per patient's niece, patient was supposed to be on some kind of soft or liquidy diet so we will put the patient on full liquids tonight.  History of antiphospholipid antibody syndrome, venous thromboembolism, recurrent CVA.  Currently on Eliquis .  Will continue.  Currently bedbound status with right-sided weakness and some contracture.  Will consult TOC PT OT.   DVT Prophylaxis: On Eliquis .  Review of Systems:  Very limited history was obtained.   Past Medical History:  Diagnosis Date   Anxiety    Arrhythmia as indication for cardiac pacemaker replacement 03/2020   Frequent falls 03/2020   History of loop recorder 04/2017   ICH (intracerebral hemorrhage) (HCC) 06/19/2023   Stroke (cerebrum) (HCC)    Tobacco use    Urinary frequency 09/2019   Vitamin D  deficiency    Past Surgical History:  Procedure Laterality Date   FOOT SURGERY  LOOP RECORDER INSERTION N/A 04/13/2017   Procedure: Loop Recorder Insertion;  Surgeon: Waddell Danelle ORN, MD;  Location: [facility] INVASIVE CV LAB;  Service: Cardiovascular;  Laterality: N/A;   TEE WITHOUT CARDIOVERSION N/A 04/13/2017   Procedure: TRANSESOPHAGEAL ECHOCARDIOGRAM (TEE);  Surgeon:  Jeffrie Oneil BROCKS, MD;  Location: [facility] ENDOSCOPY;  Service: Cardiovascular;  Laterality: N/A;    Social History:  reports that he has been smoking cigarettes. He started smoking about 46 years ago. He has a 46.6 pack-year smoking history. He has never used smokeless tobacco. He reports that he does not drink alcohol and does not use drugs.  No Known Allergies  Family History  Problem Relation Age of Onset   Diabetes Mother    COPD Mother    Diabetes Father    Heart failure Father    Cancer Sister      Prior to Admission medications   Medication Sig Start Date End Date Taking? Authorizing Provider  acetaminophen  (TYLENOL ) 325 MG tablet Take 2 tablets (650 mg total) by mouth every 6 (six) hours as needed for mild pain, moderate pain, fever or headache (or temp > 37.5 C (99.5 F)). 06/22/23   Hongalgi, Anand D, MD  acidophilus (RISAQUAD) CAPS capsule Take 2 capsules by mouth daily. 04/18/24   Rai, Nydia POUR, MD  apixaban  (ELIQUIS ) 5 MG TABS tablet Take 1 tablet (5 mg total) by mouth 2 (two) times daily. 05/10/24   Ghimire, Donalda HERO, MD  bisacodyl  (DULCOLAX) 10 MG suppository Place 1 suppository (10 mg total) rectally daily as needed for mild constipation. 04/18/24   Rai, Nydia POUR, MD  cyanocobalamin  1000 MCG tablet Take 1 tablet (1,000 mcg total) by mouth daily. 09/11/22   Antoinette Doe, MD  feeding supplement (ENSURE ENLIVE / ENSURE PLUS) LIQD Take 237 mLs by mouth 2 (two) times daily between meals. Patient taking differently: Take 237 mLs by mouth 3 (three) times daily between meals. Chocolate if available 03/02/24   Tobie Yetta HERO, MD  gabapentin  (NEURONTIN ) 300 MG capsule TAKE 1 CAPSULE BY MOUTH BY MOUTH TWO TIMES DAILY Patient taking differently: Take 300 mg by mouth 2 (two) times daily. 04/22/22 05/08/24  Skeet Juliene SAUNDERS, DO  ipratropium-albuterol  (DUONEB) 0.5-2.5 (3) MG/3ML SOLN Take 3 mLs by nebulization every 6 (six) hours as needed (SOB, wheezing).    [provider]  ketoconazole  (NIZORAL) 2 % shampoo Apply 1 Application topically 2 (two) times a week. Monday and Thursday    [provider]  liver oil-zinc  oxide (DESITIN) 40 % ointment Apply 1 Application topically as needed for irritation (With each incontinent episode daily).    [provider]  nutrition supplement, JUVEN, (JUVEN) PACK Take 1 packet by mouth 2 (two) times daily between meals. 03/09/24   Patsy Lenis, MD  ondansetron  (ZOFRAN ) 4 MG tablet Take 1 tablet (4 mg total) by mouth every 6 (six) hours as needed for nausea. 04/18/24   Rai, Nydia POUR, MD  pantoprazole  (PROTONIX ) 40 MG tablet Take 1 tablet (40 mg total) by mouth at bedtime. 06/22/23   Hongalgi, Anand D, MD  polyethylene glycol (MIRALAX  / GLYCOLAX ) 17 g packet Take 17 g by mouth 2 (two) times daily. 03/09/24   Patsy Lenis, MD  potassium chloride  (KLOR-CON ) 20 MEQ packet Take 40 mEq by mouth 2 (two) times daily. 04/18/24   Rai, Ripudeep K, MD  senna-docusate (SENOKOT-S) 8.6-50 MG tablet Take 1 tablet by mouth at bedtime. 04/18/24   Rai, Nydia POUR, MD  simethicone  (MYLICON) 80 MG chewable  tablet Chew 1 tablet (80 mg total) by mouth 4 (four) times daily -  before meals and at bedtime. Patient taking differently: Chew 80 mg by mouth 4 (four) times daily. 03/02/24   Tobie Yetta HERO, MD  sodium bicarbonate  650 MG tablet Take 1 tablet (650 mg total) by mouth 2 (two) times daily. 05/10/24   Ghimire, Donalda HERO, MD  sodium phosphate (FLEET) ENEM Place 1 enema rectally every other day.    [provider]    Physical Exam:  Vitals:   06/18/24 1715 06/18/24 1730 06/18/24 1745 06/18/24 1810  BP: 110/78 92/74 114/77   Pulse:      Resp: 12 10 12    Temp:    98.5 F (36.9 C)  TempSrc:    Oral  SpO2:      Weight:      Height:       Wt Readings from Last 3 Encounters:  06/18/24 68 kg  05/08/24 67.9 kg  04/13/24 67.9 kg   Body mass index is 24.2 kg/m.  General:  Average built, not in obvious distress, alert awake but minimally  conversive. HENT: Normocephalic, No scleral pallor or icterus noted. Oral mucosa is moist.  Able to track the fingers. Chest:  Clear breath sounds.  . No crackles or wheezes.  CVS: S1 &S2 heard. No murmur.  Regular rate and rhythm. Abdomen: Soft, distended and tympanic.  Nontender on palpation.  Bowel sounds are heard. No abdominal mass palpated Extremities: No cyanosis, clubbing or edema.  Peripheral pulses are palpable. Psych: Alert, awake and minimally communicative, CNS:  No cranial nerve deficits.  Right-sided weakness with some stiffness.  Noted. Skin: Warm and dry.  No rashes noted.  Labs on Admission:   CBC: Recent Labs  Lab 06/18/24 1511  WBC 8.6  NEUTROABS 5.9  HGB 12.8*  HCT 39.8  MCV 84.9  PLT 254    Basic Metabolic Panel: Recent Labs  Lab 06/18/24 1511  NA 147*  K 2.4*  CL 113*  CO2 24  GLUCOSE 101*  BUN 19  CREATININE 1.31*  CALCIUM  8.6*    Liver Function Tests: Recent Labs  Lab 06/18/24 1511  AST 17  ALT 12  ALKPHOS 99  BILITOT 0.5  PROT 6.5  ALBUMIN 2.5*   No results for input(s): LIPASE, AMYLASE in the last 168 hours. Recent Labs  Lab 06/18/24 1556  AMMONIA <13    Cardiac Enzymes: No results for input(s): CKTOTAL, CKMB, CKMBINDEX, TROPONINI in the last 168 hours.  BNP (last 3 results) No results for input(s): BNP in the last 8760 hours.  ProBNP (last 3 results) No results for input(s): PROBNP in the last 8760 hours.  CBG: No results for input(s): GLUCAP in the last 168 hours.  Lipase  No results found for: LIPASE   Urinalysis    Component Value Date/Time   COLORURINE STRAW (A) 04/13/2024 1613   APPEARANCEUR CLEAR 04/13/2024 1613   LABSPEC 1.031 (H) 04/13/2024 1613   PHURINE 6.0 04/13/2024 1613   GLUCOSEU NEGATIVE 04/13/2024 1613   HGBUR NEGATIVE 04/13/2024 1613   BILIRUBINUR NEGATIVE 04/13/2024 1613   BILIRUBINUR negative 07/10/2021 1202   BILIRUBINUR neg 06/11/2020 0901   KETONESUR NEGATIVE  04/13/2024 1613   PROTEINUR NEGATIVE 04/13/2024 1613   UROBILINOGEN 0.2 07/10/2021 1202   UROBILINOGEN 0.2 11/30/2017 1030   NITRITE NEGATIVE 04/13/2024 1613   LEUKOCYTESUR NEGATIVE 04/13/2024 1613     Drugs of Abuse     Component Value Date/Time   LABOPIA NONE DETECTED 06/19/2023  2259   COCAINSCRNUR NONE DETECTED 06/19/2023 2259   LABBENZ NONE DETECTED 06/19/2023 2259   AMPHETMU NONE DETECTED 06/19/2023 2259   THCU NONE DETECTED 06/19/2023 2259   LABBARB NONE DETECTED 06/19/2023 2259      Radiological Exams on Admission: CT ABDOMEN PELVIS W CONTRAST Result Date: 06/18/2024 CLINICAL DATA:  Abdominal distension. EXAM: CT ABDOMEN AND PELVIS WITH CONTRAST TECHNIQUE: Multidetector CT imaging of the abdomen and pelvis was performed using the standard protocol following bolus administration of intravenous contrast. RADIATION DOSE REDUCTION: This exam was performed according to the departmental dose-optimization program which includes automated exposure control, adjustment of the mA and/or kV according to patient size and/or use of iterative reconstruction technique. CONTRAST:  75mL OMNIPAQUE  IOHEXOL  350 MG/ML SOLN COMPARISON:  CT chest abdomen and pelvis 04/13/2024. FINDINGS: Lower chest: Patchy ground-glass opacities are seen in both lung bases. Airspace disease in the right middle lobe has decreased, but has not completely resolved. Hepatobiliary: No focal liver abnormality is seen. No gallstones, gallbladder wall thickening, or biliary dilatation. Pancreas: Unremarkable. No pancreatic ductal dilatation or surrounding inflammatory changes. Spleen: Normal in size without focal abnormality. Adrenals/Urinary Tract: There is scarring in the inferior pole the right kidney. There are punctate bilateral renal calculi measuring up to 5 mm. These are unchanged. There is no hydronephrosis or perinephric fluid. The adrenal glands and bladder are within normal limits. Stomach/Bowel: The colon is diffusely  dilated with air-fluid levels to the level the rectum measuring up to 8.7 cm. There is no twisting or mesenteric edema identified. No colonic wall thickening identified. The appendix appears within normal limits. Small bowel and stomach are within normal limits. No pneumatosis or free air. Vascular/Lymphatic: Aorta and IVC are normal in size per there are findings concerning for nonocclusive thrombus within the inferior vena cava left common femoral vein, and left external iliac vein. No enlarged lymph nodes are identified. Reproductive: Prostate is unremarkable. Other: No abdominal wall hernia or abnormality. No abdominopelvic ascites. Musculoskeletal: No fracture is seen. IMPRESSION: 1. Diffuse colonic dilatation with air-fluid levels to the level of the rectum. Findings are concerning for colonic ileus. No evidence for bowel obstruction. 2. Findings nonocclusive thrombus within the inferior vena cava, left common femoral vein, and left external iliac vein. 3. Nonobstructing bilateral renal calculi. 4. Patchy ground-glass opacities in both lung bases, likely infectious/inflammatory. Electronically Signed   By: Greig Pique M.D.   On: 06/18/2024 17:13   CT Head Wo Contrast Result Date: 06/18/2024 CLINICAL DATA:  Bilateral blurred vision for greater than 24 hours EXAM: CT HEAD WITHOUT CONTRAST TECHNIQUE: Contiguous axial images were obtained from the base of the skull through the vertex without intravenous contrast. RADIATION DOSE REDUCTION: This exam was performed according to the departmental dose-optimization program which includes automated exposure control, adjustment of the mA and/or kV according to patient size and/or use of iterative reconstruction technique. COMPARISON:  05/07/2024 FINDINGS: Brain: Stable diffuse cerebral atrophy. Stable chronic ischemic changes are again seen throughout the bilateral subcortical and periventricular white matter, with stable encephalomalacia in the right occipital  region. No evidence of acute infarct or hemorrhage. The lateral ventricles and midline structures are unremarkable. No acute extra-axial fluid collections. No mass effect. Vascular: No hyperdense vessel or unexpected calcification. Skull: Normal. Negative for fracture or focal lesion. Sinuses/Orbits: No acute finding. Other: None. IMPRESSION: 1. No acute intracranial process. 2. Stable cortical atrophy, encephalomalacia, and chronic ischemic changes as above. Electronically Signed   By: Ozell Daring M.D.   On: 06/18/2024 17:11  DG Chest Portable 1 View Result Date: 06/18/2024 CLINICAL DATA:  Visual changes EXAM: PORTABLE CHEST 1 VIEW COMPARISON:  02/26/2024 FINDINGS: Single frontal view of the chest demonstrates a stable cardiac silhouette. Stable bibasilar scarring. No airspace disease, effusion, or pneumothorax. No acute bony abnormalities. IMPRESSION: 1. No acute intrathoracic process. Electronically Signed   By: Ozell Daring M.D.   On: 06/18/2024 16:16    EKG: Personally reviewed by me which shows normal sinus rhythm   Consultant: None  Code Status: Full code  MicrobiologyNone  Antibiotics:none   Family Communication:  Patients' condition and plan of care including tests being ordered have been discussed with the patient and the patient's niece who indicate understanding and agree with the plan.   Status is: Observation The patient remains OBS appropriate and will d/c before 2 midnights.   Severity of Illness: The appropriate patient status for this patient is OBSERVATION. Observation status is judged to be reasonable and necessary in order to provide the required intensity of service to ensure the patient's safety. The patient's presenting symptoms, physical exam findings, and initial radiographic and laboratory data in the context of their medical condition is felt to place them at decreased risk for further clinical deterioration. Furthermore, it is anticipated that the patient  will be medically stable for discharge from the hospital within 2 midnights of admission.   Signed, Vernal Alstrom, MD [facility] 06/18/2024

## 2024-06-18 NOTE — ED Notes (Signed)
 Received report from [place], [place]

## 2024-06-18 NOTE — ED Notes (Signed)
 Patient transported to MRI

## 2024-06-18 NOTE — ED Notes (Signed)
 Pt back from MRI at this time

## 2024-06-18 NOTE — ED Provider Notes (Signed)
 [facility] Provider Note  CSN: [id number] Arrival date & time: 06/18/24 1338  Chief Complaint(s) Visual Field Change  HPI Daniel Stuart is a 58 y.o. male with past medical history as below, significant for ICH, ileus, htn, antiphospholipid syndrome  who presents to the ED with complaint of blurry vision  Patient is a very poor historian, he feels his blurry vision has been going on for at least 24 hours.  He is unsure if it is 1 eye or both eyes.  He denies any headache, ocular trauma, pain with eye movement, no diplopia, no fevers or chills.  No headache or head injury.  No neck pain.  No numbness or weakness to extremities.  He is from a SNF  Past Medical History Past Medical History:  Diagnosis Date   Anxiety    Arrhythmia as indication for cardiac pacemaker replacement 03/2020   Frequent falls 03/2020   History of loop recorder 04/2017   ICH (intracerebral hemorrhage) (HCC) 06/19/2023   Stroke (cerebrum) (HCC)    Tobacco use    Urinary frequency 09/2019   Vitamin D  deficiency    Patient Active Problem List   Diagnosis Date Noted   Acute DVT (deep venous thrombosis) (HCC) 05/08/2024   airspace disease 04/13/2024   Prolonged QT interval 04/13/2024   Ileus (HCC) 03/05/2024   Edema of right upper arm 02/28/2024   Protein calorie malnutrition (HCC) 02/27/2024   Pressure injury of skin 02/24/2024   Colonic pseudoobstruction 02/23/2024   Colitis 02/23/2024   Hypokalemia 02/23/2024   History of intracranial hemorrhage 02/23/2024   Essential hypertension 02/23/2024   Antiphospholipid syndrome (HCC) 02/23/2024   Left carotid artery occlusion 09/09/2022   Weakness of both lower extremities 09/07/2022   Bedbound 08/25/2022   PAD (peripheral artery disease) (HCC) 08/25/2022   Arrhythmia as indication for cardiac pacemaker replacement 03/11/2020   Vitamin D  deficiency 09/13/2019   History of intracranial hemorrhage History of  severe encephalopathy History of CVA/TIA/progressive leukoencephalopathy and chronic left ICA stenosis 03/12/2019   Right sided weakness 03/12/2019   Anxiety 03/12/2019   PVD (peripheral vascular disease) (HCC) 04/12/2017   Carotid arterial disease (HCC) 04/12/2017   Home Medication(s) Prior to Admission medications   Medication Sig Start Date End Date Taking? Authorizing Provider  acetaminophen  (TYLENOL ) 325 MG tablet Take 2 tablets (650 mg total) by mouth every 6 (six) hours as needed for mild pain, moderate pain, fever or headache (or temp > 37.5 C (99.5 F)). 06/22/23   Hongalgi, Anand D, MD  acidophilus (RISAQUAD) CAPS capsule Take 2 capsules by mouth daily. 04/18/24   Rai, Nydia POUR, MD  apixaban  (ELIQUIS ) 5 MG TABS tablet Take 1 tablet (5 mg total) by mouth 2 (two) times daily. 05/10/24   Ghimire, Donalda HERO, MD  bisacodyl  (DULCOLAX) 10 MG suppository Place 1 suppository (10 mg total) rectally daily as needed for mild constipation. 04/18/24   Rai, Nydia POUR, MD  cyanocobalamin  1000 MCG tablet Take 1 tablet (1,000 mcg total) by mouth daily. 09/11/22   Antoinette Doe, MD  feeding supplement (ENSURE ENLIVE / ENSURE PLUS) LIQD Take 237 mLs by mouth 2 (two) times daily between meals. Patient taking differently: Take 237 mLs by mouth 3 (three) times daily between meals. Chocolate if available 03/02/24   Tobie Yetta HERO, MD  gabapentin  (NEURONTIN ) 300 MG capsule TAKE 1 CAPSULE BY MOUTH BY MOUTH TWO TIMES DAILY Patient taking differently: Take 300 mg by mouth 2 (two) times daily. 04/22/22 05/08/24  Skeet Juliene SAUNDERS, DO  ipratropium-albuterol  (DUONEB) 0.5-2.5 (3) MG/3ML SOLN Take 3 mLs by nebulization every 6 (six) hours as needed (SOB, wheezing).    [provider]  ketoconazole (NIZORAL) 2 % shampoo Apply 1 Application topically 2 (two) times a week. Monday and Thursday    [provider]  liver oil-zinc  oxide (DESITIN) 40 % ointment Apply 1 Application topically as needed for irritation  (With each incontinent episode daily).    [provider]  nutrition supplement, JUVEN, (JUVEN) PACK Take 1 packet by mouth 2 (two) times daily between meals. 03/09/24   Patsy Lenis, MD  ondansetron  (ZOFRAN ) 4 MG tablet Take 1 tablet (4 mg total) by mouth every 6 (six) hours as needed for nausea. 04/18/24   Rai, Nydia POUR, MD  pantoprazole  (PROTONIX ) 40 MG tablet Take 1 tablet (40 mg total) by mouth at bedtime. 06/22/23   Hongalgi, Anand D, MD  polyethylene glycol (MIRALAX  / GLYCOLAX ) 17 g packet Take 17 g by mouth 2 (two) times daily. 03/09/24   Patsy Lenis, MD  potassium chloride  (KLOR-CON ) 20 MEQ packet Take 40 mEq by mouth 2 (two) times daily. 04/18/24   Rai, Ripudeep K, MD  senna-docusate (SENOKOT-S) 8.6-50 MG tablet Take 1 tablet by mouth at bedtime. 04/18/24   Rai, Nydia POUR, MD  simethicone  (MYLICON) 80 MG chewable tablet Chew 1 tablet (80 mg total) by mouth 4 (four) times daily -  before meals and at bedtime. Patient taking differently: Chew 80 mg by mouth 4 (four) times daily. 03/02/24   Tobie Yetta HERO, MD  sodium bicarbonate  650 MG tablet Take 1 tablet (650 mg total) by mouth 2 (two) times daily. 05/10/24   Ghimire, Donalda HERO, MD  sodium phosphate (FLEET) ENEM Place 1 enema rectally every other day.    [provider]                                                                                                                                    Past Surgical History Past Surgical History:  Procedure Laterality Date   FOOT SURGERY     LOOP RECORDER INSERTION N/A 04/13/2017   Procedure: Loop Recorder Insertion;  Surgeon: Waddell Danelle ORN, MD;  Location: [facility];  Service: Cardiovascular;  Laterality: N/A;   TEE WITHOUT CARDIOVERSION N/A 04/13/2017   Procedure: TRANSESOPHAGEAL ECHOCARDIOGRAM (TEE);  Surgeon: Jeffrie Oneil BROCKS, MD;  Location: [facility] ENDOSCOPY;  Service: Cardiovascular;  Laterality: N/A;   Family History Family History  Problem Relation Age of Onset    Diabetes Mother    COPD Mother    Diabetes Father    Heart failure Father    Cancer Sister     Social History Social History   Tobacco Use   Smoking status: Every Day    Current packs/day: 1.00    Average packs/day: 1 pack/day for 46.6 years (46.6 ttl pk-yrs)    Types: Cigarettes  Start date: 11/01/1977   Smokeless tobacco: Never  Vaping Use   Vaping status: Never Used  Substance Use Topics   Alcohol use: No   Drug use: No   Allergies Patient has no known allergies.  Review of Systems A thorough review of systems was obtained and all systems are negative except as noted in the HPI and PMH.   Physical Exam Vital Signs  I have reviewed the triage vital signs BP 100/72   Pulse (!) 103   Temp (!) 97.3 F (36.3 C) (Oral)   Resp 11   Ht 5' 6 (1.676 m)   Wt 68 kg   SpO2 99%   BMI 24.20 kg/m  Physical Exam Vitals and nursing note reviewed.  Constitutional:      General: He is not in acute distress.    Appearance: Normal appearance. He is well-developed. He is not ill-appearing.  HENT:     Head: Normocephalic and atraumatic.     Right Ear: External ear normal.     Left Ear: External ear normal.     Nose: Nose normal.     Mouth/Throat:     Mouth: Mucous membranes are moist.  Eyes:     General: No scleral icterus.       Right eye: No discharge.        Left eye: No discharge.     Pupils: Pupils are equal, round, and reactive to light.  Cardiovascular:     Rate and Rhythm: Normal rate.  Pulmonary:     Effort: Pulmonary effort is normal. No respiratory distress.     Breath sounds: No stridor.  Abdominal:     General: Abdomen is flat. There is distension.     Palpations: Abdomen is soft.     Tenderness: There is no abdominal tenderness. There is no guarding.  Musculoskeletal:        General: No deformity.     Cervical back: No rigidity.  Skin:    General: Skin is warm and dry.     Coloration: Skin is not cyanotic, jaundiced or pale.  Neurological:      Mental Status: He is alert and oriented to person, place, and time.     GCS: GCS eye subscore is 4. GCS verbal subscore is 5. GCS motor subscore is 6.     Cranial Nerves: Cranial nerves 2-12 are intact. No facial asymmetry.     Sensory: Sensation is intact. No sensory deficit.     Motor: Motor function is intact. No weakness or tremor.     Comments: Noncompliant coordination testing, not compliant with EOMI, will not allow me to check his eyes individually, not compliant with finger-to-nose testing   Gait testing deferred secondary to patient safety.  Strength 5/5 to BLUE/BLLE, equal and symmetric    Psychiatric:        Speech: Speech normal.        Behavior: Behavior normal. Behavior is cooperative.     ED Results and Treatments Labs (all labs ordered are listed, but only abnormal results are displayed) Labs Reviewed  CBC WITH DIFFERENTIAL/PLATELET - Abnormal; Notable for the following components:      Result Value   Hemoglobin 12.8 (*)    RDW 16.2 (*)    All other components within normal limits  COMPREHENSIVE METABOLIC PANEL WITH GFR  Radiology No results found.  Pertinent labs & imaging results that were available during my care of the patient were reviewed by me and considered in my medical decision making (see MDM for details).  Medications Ordered in ED Medications  sodium chloride  0.9 % bolus 1,000 mL (has no administration in time range)                                                                                                                                     Procedures Procedures  (including critical care time)  Medical Decision Making / ED Course    Medical Decision Making:    Daniel Stuart is a 58 y.o. male with past medical history as below, significant for ICH, ileus, htn, antiphospholipid syndrome  who presents to the ED  with complaint of blurry vision. The complaint involves an extensive differential diagnosis and also carries with it a high risk of complications and morbidity.  Serious etiology was considered. Ddx includes but is not limited to: Infection, infarction, hepatic encephalopathy, CVA,, lecture light derangement, psychosomatic, etc.  Complete initial physical exam performed, notably the patient was in no acute distress.    Reviewed and confirmed nursing documentation for past medical history, family history, social history.  Vital signs reviewed.    Blurry vision > - Denies diplopia, he is resistant to testing, appears to have EOMI but he is resisting evaluation of his eyes.  He has PERRLA.  No icteric sclera - get CT, likely to require MRI to further differentiate as pt is very poor historian  Distended abdomen> - no pain/fever - get CT - check ammonia   Hypokalemia > - 2.4 today, start replacement  Hypernatremia > - pt also has elev cr, appears dry on exam, give IVF      Handoff to incoming EDP pending imaging/labs/and likely admission               Additional history obtained: -Additional history obtained from {[email or web address]:[id number]} -External records from outside source obtained and reviewed including: Chart review including previous notes, labs, imaging, consultation notes including  ***   Lab Tests: -I ordered, reviewed, and interpreted labs.   The pertinent results include:   Labs Reviewed  CBC WITH DIFFERENTIAL/PLATELET - Abnormal; Notable for the following components:      Result Value   Hemoglobin 12.8 (*)    RDW 16.2 (*)    All other components within normal limits  COMPREHENSIVE METABOLIC PANEL WITH GFR    Notable for ***  EKG   EKG Interpretation Date/Time:    Ventricular Rate:    PR Interval:    QRS Duration:    QT Interval:    QTC Calculation:   R Axis:      Text Interpretation:           Imaging Studies ordered: I ordered  imaging studies including *** I independently visualized the following imaging with scope  of interpretation limited to determining acute life threatening conditions related to emergency care; findings noted above I agree with the radiologist interpretation If any imaging was obtained with contrast I closely monitored patient for any possible adverse reaction a/w contrast administration in the emergency department   Medicines ordered and prescription drug management: Meds ordered this encounter  Medications   sodium chloride  0.9 % bolus 1,000 mL    -I have reviewed the patients home medicines and have made adjustments as needed   Consultations Obtained: I requested consultation with the ***,  and discussed lab and imaging findings as well as pertinent plan - they recommend: ***   Cardiac Monitoring: The patient was maintained on a cardiac monitor.  I personally viewed and interpreted the cardiac monitored which showed an underlying rhythm of: *** Continuous pulse oximetry interpreted by myself, ***% on ***.    Social Determinants of Health:  Diagnosis or treatment significantly limited by social determinants of health: {wssoc:28071}   Reevaluation: After the interventions noted above, I reevaluated the patient and found that they have {resolved/improved/worsened:23923::improved}  Co morbidities that complicate the patient evaluation  Past Medical History:  Diagnosis Date   Anxiety    Arrhythmia as indication for cardiac pacemaker replacement 03/2020   Frequent falls 03/2020   History of loop recorder 04/2017   ICH (intracerebral hemorrhage) (HCC) 06/19/2023   Stroke (cerebrum) (HCC)    Tobacco use    Urinary frequency 09/2019   Vitamin D  deficiency       Dispostion: Disposition decision including need for hospitalization was considered, and patient {wsdispo:28070::discharged from emergency department.}    Final Clinical Impression(s) / ED Diagnoses Final  diagnoses:  None

## 2024-06-18 NOTE — ED Notes (Signed)
 Ginger Ale given to pt as requested

## 2024-06-18 NOTE — ED Notes (Signed)
 Potassium order was unclear when taken over care of pt. I called pharmacist for some clarifications, pr Dr's note pt was initially supposed to received 7 bags of k+, however upon verification, the dosages were not timed appropriately. Pharmacist reordered  the 4 doses that were not properly timed.

## 2024-06-19 ENCOUNTER — Encounter (HOSPITAL_COMMUNITY)

## 2024-06-19 ENCOUNTER — Observation Stay (HOSPITAL_COMMUNITY)

## 2024-06-19 ENCOUNTER — Encounter (HOSPITAL_COMMUNITY): Payer: Self-pay | Admitting: Internal Medicine

## 2024-06-19 DIAGNOSIS — I6389 Other cerebral infarction: Secondary | ICD-10-CM

## 2024-06-19 DIAGNOSIS — Z8249 Family history of ischemic heart disease and other diseases of the circulatory system: Secondary | ICD-10-CM | POA: Diagnosis not present

## 2024-06-19 DIAGNOSIS — E870 Hyperosmolality and hypernatremia: Secondary | ICD-10-CM | POA: Diagnosis present

## 2024-06-19 DIAGNOSIS — F0150 Vascular dementia without behavioral disturbance: Secondary | ICD-10-CM | POA: Diagnosis present

## 2024-06-19 DIAGNOSIS — L89613 Pressure ulcer of right heel, stage 3: Secondary | ICD-10-CM | POA: Diagnosis present

## 2024-06-19 DIAGNOSIS — F17210 Nicotine dependence, cigarettes, uncomplicated: Secondary | ICD-10-CM

## 2024-06-19 DIAGNOSIS — Z1152 Encounter for screening for COVID-19: Secondary | ICD-10-CM | POA: Diagnosis not present

## 2024-06-19 DIAGNOSIS — I69391 Dysphagia following cerebral infarction: Secondary | ICD-10-CM

## 2024-06-19 DIAGNOSIS — Z833 Family history of diabetes mellitus: Secondary | ICD-10-CM | POA: Diagnosis not present

## 2024-06-19 DIAGNOSIS — Z7189 Other specified counseling: Secondary | ICD-10-CM | POA: Diagnosis not present

## 2024-06-19 DIAGNOSIS — L89312 Pressure ulcer of right buttock, stage 2: Secondary | ICD-10-CM | POA: Diagnosis present

## 2024-06-19 DIAGNOSIS — I63511 Cerebral infarction due to unspecified occlusion or stenosis of right middle cerebral artery: Secondary | ICD-10-CM | POA: Diagnosis present

## 2024-06-19 DIAGNOSIS — D6861 Antiphospholipid syndrome: Secondary | ICD-10-CM | POA: Diagnosis present

## 2024-06-19 DIAGNOSIS — Z515 Encounter for palliative care: Secondary | ICD-10-CM | POA: Diagnosis not present

## 2024-06-19 DIAGNOSIS — G9349 Other encephalopathy: Secondary | ICD-10-CM | POA: Diagnosis present

## 2024-06-19 DIAGNOSIS — Z8679 Personal history of other diseases of the circulatory system: Secondary | ICD-10-CM | POA: Diagnosis not present

## 2024-06-19 DIAGNOSIS — I69351 Hemiplegia and hemiparesis following cerebral infarction affecting right dominant side: Secondary | ICD-10-CM

## 2024-06-19 DIAGNOSIS — I6340 Cerebral infarction due to embolism of unspecified cerebral artery: Secondary | ICD-10-CM

## 2024-06-19 DIAGNOSIS — I639 Cerebral infarction, unspecified: Secondary | ICD-10-CM | POA: Diagnosis not present

## 2024-06-19 DIAGNOSIS — Z825 Family history of asthma and other chronic lower respiratory diseases: Secondary | ICD-10-CM | POA: Diagnosis not present

## 2024-06-19 DIAGNOSIS — R29716 NIHSS score 16: Secondary | ICD-10-CM | POA: Diagnosis not present

## 2024-06-19 DIAGNOSIS — Z7901 Long term (current) use of anticoagulants: Secondary | ICD-10-CM

## 2024-06-19 DIAGNOSIS — I10 Essential (primary) hypertension: Secondary | ICD-10-CM | POA: Diagnosis present

## 2024-06-19 DIAGNOSIS — H47619 Cortical blindness, unspecified side of brain: Secondary | ICD-10-CM

## 2024-06-19 DIAGNOSIS — G9389 Other specified disorders of brain: Secondary | ICD-10-CM | POA: Diagnosis present

## 2024-06-19 DIAGNOSIS — R54 Age-related physical debility: Secondary | ICD-10-CM | POA: Diagnosis present

## 2024-06-19 DIAGNOSIS — R1310 Dysphagia, unspecified: Secondary | ICD-10-CM | POA: Diagnosis present

## 2024-06-19 DIAGNOSIS — Z7401 Bed confinement status: Secondary | ICD-10-CM | POA: Diagnosis not present

## 2024-06-19 DIAGNOSIS — R627 Adult failure to thrive: Secondary | ICD-10-CM | POA: Diagnosis present

## 2024-06-19 DIAGNOSIS — K567 Ileus, unspecified: Secondary | ICD-10-CM | POA: Diagnosis present

## 2024-06-19 DIAGNOSIS — H538 Other visual disturbances: Secondary | ICD-10-CM | POA: Diagnosis not present

## 2024-06-19 DIAGNOSIS — E876 Hypokalemia: Secondary | ICD-10-CM | POA: Diagnosis present

## 2024-06-19 DIAGNOSIS — I69398 Other sequelae of cerebral infarction: Secondary | ICD-10-CM

## 2024-06-19 LAB — URINALYSIS, ROUTINE W REFLEX MICROSCOPIC
Bilirubin Urine: NEGATIVE
Glucose, UA: NEGATIVE mg/dL
Hgb urine dipstick: NEGATIVE
Ketones, ur: NEGATIVE mg/dL
Leukocytes,Ua: NEGATIVE
Nitrite: NEGATIVE
Protein, ur: NEGATIVE mg/dL
Specific Gravity, Urine: 1.023 (ref 1.005–1.030)
pH: 6 (ref 5.0–8.0)

## 2024-06-19 LAB — LIPID PANEL
Cholesterol: 113 mg/dL (ref 0–200)
HDL: 31 mg/dL — ABNORMAL LOW (ref >40)
LDL Cholesterol: 68 mg/dL (ref 0–99)
Total CHOL/HDL Ratio: 3.6 ratio
Triglycerides: 70 mg/dL (ref <150)
VLDL: 14 mg/dL (ref 0–40)

## 2024-06-19 LAB — BASIC METABOLIC PANEL WITH GFR
Anion gap: 11 (ref 5–15)
BUN: 12 mg/dL (ref 6–20)
CO2: 22 mmol/L (ref 22–32)
Calcium: 7.9 mg/dL — ABNORMAL LOW (ref 8.9–10.3)
Chloride: 113 mmol/L — ABNORMAL HIGH (ref 98–111)
Creatinine, Ser: 1.22 mg/dL (ref 0.61–1.24)
GFR, Estimated: >60 mL/min (ref >60)
Glucose, Bld: 87 mg/dL (ref 70–99)
Potassium: 3 mmol/L — ABNORMAL LOW (ref 3.5–5.1)
Sodium: 146 mmol/L — ABNORMAL HIGH (ref 135–145)

## 2024-06-19 LAB — CBC
HCT: 38.4 % — ABNORMAL LOW (ref 39.0–52.0)
Hemoglobin: 12.5 g/dL — ABNORMAL LOW (ref 13.0–17.0)
MCH: 27.4 pg (ref 26.0–34.0)
MCHC: 32.6 g/dL (ref 30.0–36.0)
MCV: 84.2 fL (ref 80.0–100.0)
Platelets: 262 K/uL (ref 150–400)
RBC: 4.56 MIL/uL (ref 4.22–5.81)
RDW: 16.1 % — ABNORMAL HIGH (ref 11.5–15.5)
WBC: 9.2 K/uL (ref 4.0–10.5)
nRBC: 0 % (ref 0.0–0.2)

## 2024-06-19 LAB — MAGNESIUM
Magnesium: 1.8 mg/dL (ref 1.7–2.4)
Magnesium: 2 mg/dL (ref 1.7–2.4)

## 2024-06-19 LAB — PROTIME-INR
INR: 1.3 — ABNORMAL HIGH (ref 0.8–1.2)
Prothrombin Time: 17.2 s — ABNORMAL HIGH (ref 11.4–15.2)

## 2024-06-19 LAB — HEMOGLOBIN A1C
Hgb A1c MFr Bld: 5.6 % (ref 4.8–5.6)
Mean Plasma Glucose: 114.02 mg/dL

## 2024-06-19 LAB — MRSA NEXT GEN BY PCR, NASAL: MRSA by PCR Next Gen: DETECTED — AB

## 2024-06-19 MED ORDER — IOHEXOL 350 MG/ML SOLN
75.0000 mL | Freq: Once | INTRAVENOUS | Status: AC | PRN
  Administered 2024-06-19: 75 mL via INTRAVENOUS

## 2024-06-19 MED ORDER — MUPIROCIN 2 % EX OINT
1.0000 | TOPICAL_OINTMENT | Freq: Two times a day (BID) | CUTANEOUS | Status: DC
  Administered 2024-06-19 – 2024-06-21 (×4): 1 via NASAL
  Filled 2024-06-19: qty 22

## 2024-06-19 MED ORDER — VITAMIN D 25 MCG (1000 UNIT) PO TABS
1000.0000 [IU] | ORAL_TABLET | Freq: Every morning | ORAL | Status: DC
  Administered 2024-06-20 – 2024-06-21 (×2): 1000 [IU] via ORAL
  Filled 2024-06-19 (×2): qty 1

## 2024-06-19 MED ORDER — SODIUM BICARBONATE 650 MG PO TABS
650.0000 mg | ORAL_TABLET | Freq: Two times a day (BID) | ORAL | Status: DC
  Administered 2024-06-19 – 2024-06-20 (×3): 650 mg via ORAL
  Filled 2024-06-19 (×3): qty 1

## 2024-06-19 MED ORDER — POLYETHYLENE GLYCOL 3350 17 G PO PACK
17.0000 g | PACK | Freq: Two times a day (BID) | ORAL | Status: DC
  Administered 2024-06-19 – 2024-06-21 (×4): 17 g via ORAL
  Filled 2024-06-19 (×4): qty 1

## 2024-06-19 MED ORDER — POTASSIUM CHLORIDE 10 MEQ/100ML IV SOLN
10.0000 meq | Freq: Once | INTRAVENOUS | Status: AC
  Administered 2024-06-19: 10 meq via INTRAVENOUS

## 2024-06-19 MED ORDER — POTASSIUM CHLORIDE 10 MEQ/100ML IV SOLN
10.0000 meq | INTRAVENOUS | Status: AC
  Administered 2024-06-19 (×3): 10 meq via INTRAVENOUS
  Filled 2024-06-19 (×5): qty 100

## 2024-06-19 MED ORDER — ZINC OXIDE 40 % EX OINT
TOPICAL_OINTMENT | Freq: Two times a day (BID) | CUTANEOUS | Status: DC
  Filled 2024-06-19: qty 57

## 2024-06-19 MED ORDER — CHLORHEXIDINE GLUCONATE CLOTH 2 % EX PADS
6.0000 | MEDICATED_PAD | Freq: Every day | CUTANEOUS | Status: DC
  Administered 2024-06-19 – 2024-06-21 (×3): 6 via TOPICAL

## 2024-06-19 NOTE — Progress Notes (Signed)
 OT Cancellation Note  Patient Details Name: Daniel Stuart MRN: [id number] DOB: [birth year]   Cancelled Treatment:    Reason Eval/Treat Not Completed: Patient at procedure or test/ unavailable (CT at the time of eval attempt). Pt from SNF with return to SNF pending.   Ely Molt 06/19/2024, 1:25 PM

## 2024-06-19 NOTE — Evaluation (Signed)
 Clinical/Bedside Swallow Evaluation Patient Details  Name: Daniel Stuart MRN: [id number] Date of Birth: [birth year]  Today's Date: 06/19/2024 Time: SLP Start Time (ACUTE ONLY): 1445 SLP Stop Time (ACUTE ONLY): 1510 SLP Time Calculation (min) (ACUTE ONLY): 25 min  Past Medical History:  Past Medical History:  Diagnosis Date   Anxiety    Arrhythmia as indication for cardiac pacemaker replacement 03/2020   Frequent falls 03/2020   History of loop recorder 04/2017   ICH (intracerebral hemorrhage) (HCC) 06/19/2023   Stroke (cerebrum) (HCC)    Tobacco use    Urinary frequency 09/2019   Vitamin D  deficiency    Past Surgical History:  Past Surgical History:  Procedure Laterality Date   FOOT SURGERY     LOOP RECORDER INSERTION N/A 04/13/2017   Procedure: Loop Recorder Insertion;  Surgeon: Waddell Danelle ORN, MD;  Location: [facility];  Service: Cardiovascular;  Laterality: N/A;   TEE WITHOUT CARDIOVERSION N/A 04/13/2017   Procedure: TRANSESOPHAGEAL ECHOCARDIOGRAM (TEE);  Surgeon: Jeffrie Oneil BROCKS, MD;  Location: [facility] ENDOSCOPY;  Service: Cardiovascular;  Laterality: N/A;   HPI:  Patient is a 58 y.o. male with past medical history of intracranial hemorrhage venous thromboembolism,bedbound at baseline, chronic ileus, poor appetite. He presented hospital with complaints of blurry vision. CT head scan showed no acutre intracranial process, stable cortical atrophy encephalomalacia and chronic ischemic changes. Patient was considered for observation due to significant electrolyte imbalance and concerns for blurriness of the eyes. The patient presented with a stroke with an acute right insular and overlying operculuminfarct involving both cortex and subcortical white matter with associated restricted diffusion as seen on MRI brain. Patient failed the Alanna Hila Screen on 06/19/24 due to being lethargic at 0100. This prompted the order of speech swallow evaluation.    Assessment / Plan /  Recommendation  Clinical Impression  Patient presents with clinical s/s of what appears to be a cognitive based oral dysphagia. He was impulsive with drinking liquids, consuming large volume very quickly with straw sips, however no overt s/s aspiration during or after consuming any thin liquids. With solids (graham cracker), he exhibited prolonged mastication but full clearance of PO's s/p swallows. SLP is recommending to initiate Dys 2 (minced) solids, thin liquids and will f/u for toleration and ability to advance. SLP Visit Diagnosis: Dysphagia, oral phase (R13.11)    Aspiration Risk  Mild aspiration risk    Diet Recommendation Dysphagia 2 (Fine chop);Thin liquid    Liquid Administration via: Straw;Cup Medication Administration: Whole meds with liquid Supervision: Full supervision/cueing for compensatory strategies;Staff to assist with self feeding Compensations: Slow rate;Small sips/bites;Minimize environmental distractions    Other  Recommendations Oral Care Recommendations: Oral care BID     Assistance Recommended at Discharge    Functional Status Assessment Patient has had a recent decline in their functional status and demonstrates the ability to make significant improvements in function in a reasonable and predictable amount of time.  Frequency and Duration min 2x/week  1 week       Prognosis Prognosis for improved oropharyngeal function: Good Barriers to Reach Goals: Cognitive deficits      Swallow Study   General Date of Onset: 06/18/24 HPI: Patient is a 58 y.o. male with past medical history of intracranial hemorrhage venous thromboembolism,bedbound at baseline, chronic ileus, poor appetite. He presented hospital with complaints of blurry vision. CT head scan showed no acutre intracranial process, stable cortical atrophy encephalomalacia and chronic ischemic changes. Patient was considered for observation due to significant electrolyte  imbalance and concerns for blurriness  of the eyes. The patient presented with a stroke with an acute right insular and overlying operculuminfarct involving both cortex and subcortical white matter with associated restricted diffusion as seen on MRI brain. Patient failed the Alanna Hila Screen on 06/19/24 due to being lethargic at 0100. This prompted the order of speech swallow evaluation. Type of Study: Bedside Swallow Evaluation Previous Swallow Assessment: none found Diet Prior to this Study: NPO Temperature Spikes Noted: No Respiratory Status: Room air History of Recent Intubation: No Behavior/Cognition: Alert;Cooperative;Pleasant mood;Requires cueing Oral Cavity Assessment: Dry Oral Care Completed by SLP: No Oral Cavity - Dentition: Adequate natural dentition Vision: Functional for self-feeding Self-Feeding Abilities: Needs assist;Needs set up Patient Positioning: Upright in bed Baseline Vocal Quality: Normal Volitional Cough: Cognitively unable to elicit Volitional Swallow: Unable to elicit    Oral/Motor/Sensory Function Overall Oral Motor/Sensory Function: Within functional limits   Ice Chips     Thin Liquid Thin Liquid: Within functional limits Presentation: Straw;Cup    Nectar Thick     Honey Thick     Puree Puree: Within functional limits Presentation: Spoon   Solid     Solid: Impaired Oral Phase Impairments: Impaired mastication Oral Phase Functional Implications: Impaired mastication      Norleen IVAR Blase, MA, CCC-SLP Speech Therapy

## 2024-06-19 NOTE — [place] FL2 (Signed)
 [place]  MEDICAID FL2 LEVEL OF CARE FORM     IDENTIFICATION  Patient Name: Daniel Stuart Birthdate: Feb 22, 1966 Sex: male Admission Date (Current Location): 06/18/2024  [facility] and [place] Number:  Producer, television/film/video and Address:  [facility] [place], [address], [place], [place] [address]      Provider Number: [id number]  Attending Physician Name and Address:  Raenelle Coria, MD  Relative Name and Phone Number:  [name] Mote (sister) [phone]    Current Level of Care: Hospital Recommended Level of Care: Skilled Nursing Facility Prior Approval Number:    Date Approved/Denied:   PASRR Number: [id number]  Discharge Plan: SNF    Current Diagnoses: Patient Active Problem List   Diagnosis Date Noted   Acute DVT (deep venous thrombosis) (HCC) 05/08/2024   airspace disease 04/13/2024   Prolonged QT interval 04/13/2024   Ileus (HCC) 03/05/2024   Edema of right upper arm 02/28/2024   Protein calorie malnutrition (HCC) 02/27/2024   Pressure injury of skin 02/24/2024   Colonic pseudoobstruction 02/23/2024   Colitis 02/23/2024   Hypokalemia 02/23/2024   History of intracranial hemorrhage 02/23/2024   Essential hypertension 02/23/2024   Antiphospholipid syndrome (HCC) 02/23/2024   Left carotid artery occlusion 09/09/2022   Weakness of both lower extremities 09/07/2022   Bedbound 08/25/2022   PAD (peripheral artery disease) (HCC) 08/25/2022   Arrhythmia as indication for cardiac pacemaker replacement 03/11/2020   Vitamin D  deficiency 09/13/2019   History of intracranial hemorrhage History of severe encephalopathy History of CVA/TIA/progressive leukoencephalopathy and chronic left ICA stenosis 03/12/2019   Right sided weakness 03/12/2019   Anxiety 03/12/2019   PVD (peripheral vascular disease) (HCC) 04/12/2017   Carotid arterial disease (HCC) 04/12/2017    Orientation RESPIRATION BLADDER Height & Weight     Self  Normal Incontinent,  External catheter (External Urinary Catheter) Weight: 132 lb 0.9 oz (59.9 kg) Height:  5' 6 (167.6 cm)  BEHAVIORAL SYMPTOMS/MOOD NEUROLOGICAL BOWEL NUTRITION STATUS      Incontinent Diet (Please see discharge summary)  AMBULATORY STATUS COMMUNICATION OF NEEDS Skin   Total Care Verbally Other (Comment) (Wound/Incision LDAs,Wound PI Buttocks,R,stage 2,Wound PI ankle,R,Lateral unstageable)                       Personal Care Assistance Level of Assistance  Bathing, Feeding, Dressing Bathing Assistance: Maximum assistance Feeding assistance: Maximum assistance Dressing Assistance: Maximum assistance     Functional Limitations Info  Speech, Hearing   Hearing Info: Adequate Speech Info: Adequate    SPECIAL CARE FACTORS FREQUENCY                       Contractures Contractures Info: Not present    Additional Factors Info  Code Status, Allergies, Psychotropic Code Status Info: FULL Allergies Info: NKA Psychotropic Info: gabapentin  (NEURONTIN ) capsule 300 mg 2 times daily         Current Medications (06/19/2024):  This is the current hospital active medication list Current Facility-Administered Medications  Medication Dose Route Frequency Provider Last Rate Last Admin   0.9 %  sodium chloride  infusion   Intravenous Continuous Pokhrel, Laxman, MD 40 mL/hr at 06/19/24 0047 New Bag at 06/19/24 0047   0.9 %  sodium chloride  infusion  250 mL Intravenous PRN Pokhrel, Laxman, MD       acetaminophen  (TYLENOL ) tablet 650 mg  650 mg Oral Q6H PRN Pokhrel, Laxman, MD       acidophilus (RISAQUAD) capsule 2 capsule  2 capsule Oral Daily Pokhrel, Laxman, MD       bisacodyl  (DULCOLAX) suppository 10 mg  10 mg Rectal Daily PRN Pokhrel, Laxman, MD       cholecalciferol  (VITAMIN D3) 25 MCG (1000 UNIT) tablet 1,000 Units  1,000 Units Oral q morning Ghimire, Renato, MD       cyanocobalamin  (VITAMIN B12) tablet 1,000 mcg  1,000 mcg Oral Daily Pokhrel, Laxman, MD       gabapentin  (NEURONTIN )  capsule 300 mg  300 mg Oral BID Pokhrel, Laxman, MD       ipratropium-albuterol  (DUONEB) 0.5-2.5 (3) MG/3ML nebulizer solution 3 mL  3 mL Nebulization Q6H PRN Pokhrel, Laxman, MD       liver oil-zinc  oxide (DESITIN) 40 % ointment   Topical BID Ghimire, Kuber, MD       nutrition supplement (JUVEN) (JUVEN) powder packet 1 packet  1 packet Oral BID BM Pokhrel, Laxman, MD       pantoprazole  (PROTONIX ) EC tablet 40 mg  40 mg Oral QHS Pokhrel, Laxman, MD   40 mg at 06/18/24 2256   polyethylene glycol (MIRALAX  / GLYCOLAX ) packet 17 g  17 g Oral BID Ghimire, Kuber, MD       potassium chloride  (KLOR-CON ) packet 40 mEq  40 mEq Oral BID Pokhrel, Laxman, MD   40 mEq at 06/18/24 2256   potassium chloride  10 mEq in 100 mL IVPB  10 mEq Intravenous Q1 Hr x 4 Ghimire, Kuber, MD 100 mL/hr at 06/19/24 1107 10 mEq at 06/19/24 1107   simethicone  (MYLICON) chewable tablet 80 mg  80 mg Oral QID Pokhrel, Laxman, MD       sodium bicarbonate  tablet 650 mg  650 mg Oral BID Raenelle Renato, MD       sodium chloride  flush (NS) 0.9 % injection 3 mL  3 mL Intravenous Q12H Pokhrel, Laxman, MD   3 mL at 06/19/24 0949   sodium chloride  flush (NS) 0.9 % injection 3 mL  3 mL Intravenous PRN Pokhrel, Laxman, MD         Discharge Medications: Please see discharge summary for a list of discharge medications.  Relevant Imaging Results:  Relevant Lab Results:   Additional Information [id number]  Isaiah Public, LCSWA

## 2024-06-19 NOTE — Progress Notes (Addendum)
 STROKE TEAM PROGRESS NOTE   INTERIM HISTORY/SUBJECTIVE Seen in room, no family at the bedside. Endorses compliance with eliquis  and all medications at SNF. MRI shows ischemic infarct.  CTA head and neck pending Continue Elqius   OBJECTIVE  CBC    Component Value Date/Time   WBC 9.2 06/19/2024 0530   RBC 4.56 06/19/2024 0530   HGB 12.5 (L) 06/19/2024 0530   HGB 15.3 06/11/2020 1041   HCT 38.4 (L) 06/19/2024 0530   HCT 44.9 06/11/2020 1041   PLT 262 06/19/2024 0530   PLT 230 06/11/2020 1041   MCV 84.2 06/19/2024 0530   MCV 93 06/11/2020 1041   MCH 27.4 06/19/2024 0530   MCHC 32.6 06/19/2024 0530   RDW 16.1 (H) 06/19/2024 0530   RDW 13.8 06/11/2020 1041   LYMPHSABS 1.9 06/18/2024 1511   LYMPHSABS 1.7 06/11/2020 1041   MONOABS 0.6 06/18/2024 1511   EOSABS 0.1 06/18/2024 1511   EOSABS 0.1 06/11/2020 1041   BASOSABS 0.0 06/18/2024 1511   BASOSABS 0.0 06/11/2020 1041    BMET    Component Value Date/Time   NA 146 (H) 06/19/2024 0530   NA 140 01/11/2022 1121   K 3.0 (L) 06/19/2024 0530   CL 113 (H) 06/19/2024 0530   CO2 22 06/19/2024 0530   GLUCOSE 87 06/19/2024 0530   BUN 12 06/19/2024 0530   BUN 5 (L) 01/11/2022 1121   CREATININE 1.22 06/19/2024 0530   CREATININE 1.24 04/18/2017 1012   CALCIUM  7.9 (L) 06/19/2024 0530   EGFR 80 01/11/2022 1121   GFRNONAA >60 06/19/2024 0530   GFRNONAA 66 04/18/2017 1012    IMAGING past 24 hours MR Brain W and Wo Contrast Result Date: 06/18/2024 CLINICAL DATA:  Stroke, follow up patient reporting blurred vision, new onset - difficult to discern further history EXAM: MRI HEAD WITHOUT AND WITH CONTRAST TECHNIQUE: Multiplanar, multiecho pulse sequences of the brain and surrounding structures were obtained without and with intravenous contrast. CONTRAST:  6mL GADAVIST  GADOBUTROL  1 MMOL/ML IV SOLN COMPARISON:  MRI head 05/08/2024. FINDINGS: Brain: Acute right insular and overlying operculuminfarct involving both cortex and subcortical white  matter with associated restricted diffusion. Associated edema without mass effect or midline shift. Bilateral parieto-occipital and bifrontal encephalomalacia. Evidence of prior hemorrhage in the right parietal and occipital region. Extensive confluent T2/FLAIR hyperintensity in the white matter. Vascular: Normal flow voids. Skull and upper cervical spine: Normal marrow signal. Sinuses/Orbits: Negative. IMPRESSION: 1. Acute right insular and overlying operculum infarct. No mass effect. 2. Multiple areas of encephalomalacia and extensive chronic microvascular ischemic disease. Electronically Signed   By: Gilmore GORMAN Molt M.D.   On: 06/18/2024 22:05   CT ABDOMEN PELVIS W CONTRAST Result Date: 06/18/2024 CLINICAL DATA:  Abdominal distension. EXAM: CT ABDOMEN AND PELVIS WITH CONTRAST TECHNIQUE: Multidetector CT imaging of the abdomen and pelvis was performed using the standard protocol following bolus administration of intravenous contrast. RADIATION DOSE REDUCTION: This exam was performed according to the departmental dose-optimization program which includes automated exposure control, adjustment of the mA and/or kV according to patient size and/or use of iterative reconstruction technique. CONTRAST:  75mL OMNIPAQUE  IOHEXOL  350 MG/ML SOLN COMPARISON:  CT chest abdomen and pelvis 04/13/2024. FINDINGS: Lower chest: Patchy ground-glass opacities are seen in both lung bases. Airspace disease in the right middle lobe has decreased, but has not completely resolved. Hepatobiliary: No focal liver abnormality is seen. No gallstones, gallbladder wall thickening, or biliary dilatation. Pancreas: Unremarkable. No pancreatic ductal dilatation or surrounding inflammatory changes. Spleen: Normal in size  without focal abnormality. Adrenals/Urinary Tract: There is scarring in the inferior pole the right kidney. There are punctate bilateral renal calculi measuring up to 5 mm. These are unchanged. There is no hydronephrosis or  perinephric fluid. The adrenal glands and bladder are within normal limits. Stomach/Bowel: The colon is diffusely dilated with air-fluid levels to the level the rectum measuring up to 8.7 cm. There is no twisting or mesenteric edema identified. No colonic wall thickening identified. The appendix appears within normal limits. Small bowel and stomach are within normal limits. No pneumatosis or free air. Vascular/Lymphatic: Aorta and IVC are normal in size per there are findings concerning for nonocclusive thrombus within the inferior vena cava left common femoral vein, and left external iliac vein. No enlarged lymph nodes are identified. Reproductive: Prostate is unremarkable. Other: No abdominal wall hernia or abnormality. No abdominopelvic ascites. Musculoskeletal: No fracture is seen. IMPRESSION: 1. Diffuse colonic dilatation with air-fluid levels to the level of the rectum. Findings are concerning for colonic ileus. No evidence for bowel obstruction. 2. Findings nonocclusive thrombus within the inferior vena cava, left common femoral vein, and left external iliac vein. 3. Nonobstructing bilateral renal calculi. 4. Patchy ground-glass opacities in both lung bases, likely infectious/inflammatory. Electronically Signed   By: Greig Pique M.D.   On: 06/18/2024 17:13   CT Head Wo Contrast Result Date: 06/18/2024 CLINICAL DATA:  Bilateral blurred vision for greater than 24 hours EXAM: CT HEAD WITHOUT CONTRAST TECHNIQUE: Contiguous axial images were obtained from the base of the skull through the vertex without intravenous contrast. RADIATION DOSE REDUCTION: This exam was performed according to the departmental dose-optimization program which includes automated exposure control, adjustment of the mA and/or kV according to patient size and/or use of iterative reconstruction technique. COMPARISON:  05/07/2024 FINDINGS: Brain: Stable diffuse cerebral atrophy. Stable chronic ischemic changes are again seen throughout the  bilateral subcortical and periventricular white matter, with stable encephalomalacia in the right occipital region. No evidence of acute infarct or hemorrhage. The lateral ventricles and midline structures are unremarkable. No acute extra-axial fluid collections. No mass effect. Vascular: No hyperdense vessel or unexpected calcification. Skull: Normal. Negative for fracture or focal lesion. Sinuses/Orbits: No acute finding. Other: None. IMPRESSION: 1. No acute intracranial process. 2. Stable cortical atrophy, encephalomalacia, and chronic ischemic changes as above. Electronically Signed   By: Ozell Daring M.D.   On: 06/18/2024 17:11   DG Chest Portable 1 View Result Date: 06/18/2024 CLINICAL DATA:  Visual changes EXAM: PORTABLE CHEST 1 VIEW COMPARISON:  02/26/2024 FINDINGS: Single frontal view of the chest demonstrates a stable cardiac silhouette. Stable bibasilar scarring. No airspace disease, effusion, or pneumothorax. No acute bony abnormalities. IMPRESSION: 1. No acute intrathoracic process. Electronically Signed   By: Ozell Daring M.D.   On: 06/18/2024 16:16    Vitals:   06/19/24 0026 06/19/24 0051 06/19/24 0521 06/19/24 0744  BP: 102/70  97/69   Pulse: 84  81   Resp: 12  15   Temp: 98.2 F (36.8 C)  97.9 F (36.6 C) 98.7 F (37.1 C)  TempSrc: Oral  Oral Oral  SpO2: 100%  99%   Weight:  59.9 kg    Height:         Constitutional: Appears in pain, overall frail Psych: Affect appropriate to situation.  Eyes: No scleral injection.  HENT: No OP obstruction.  Head: Normocephalic.  Cardiovascular: Normal rate and regular rhythm on monitor Respiratory: Effort normal, non-labored breathing.  GI: Distended, soft. Skin: [facility]   Neurologic  Examination    Mental status:Alert, awake, states age as 59. Unable to identify location without choices. Speech/language: No dysarthria or aphasia. Able to follow commands however requires repeated prompting. Repetition intact. Does not identify  objects.  Cranial nerves:   CN II Pupils reactive.    CN III,IV,VI left hemianopia,    CN V Normal sensation    CN VII Right facial droop.   CN VIII normal hearing to speech    CN IX & X normal palatal elevation, no uvular deviation    CN XI 5/5 shoulder shrug   CN XII Tongue midline    Motor:  Muscle bulk: decreased Tone: increased in bilateral lower extremities, however confounded by pain RUE and LUE elevates without drift, increased tone Hand grasp weaker on the left LLE elevates antigravity, slower to withdraw RLE contracted, WTP brisk  Sensation: Inconsistent (able to report being touched but cannot say where)   Coordination/Complex Motor:  - Finger to Nose: intact but slow on the left - Heel to shin: UTA - Gait: deferred (wheelchair bound)   ASSESSMENT/PLAN  Daniel Stuart is a 58 y.o. male with history of probable antiphospholipid syndrome, right parieto-occipital ICH (in the setting of triple therapy, 2024), multiple ischemic strokes (residual right-sided spastic hemiplegia, right gaze preference, cortical blindness) with resulting leukoencephalopathy, left ICA occlusion, HTN, smoking history who presented from nursing home for acute on chronic blurry vision.   Stroke:  right frontal moderate infarct, etiology:  embolic pattern, likely due to APS and hypercoagulable state even on Eliquis  Code Stroke CT head - No acute hemorrhage CTA head & neck pending  MRI  Acute R insular infarct. Extensive T2/FLAIR signal change of white matter. Encephalomalacia bilaterally in anterior and posterior circulation territories.  2D Echo EF 65-70% LDL 68 HgbA1c 5.6 VTE prophylaxis - eliquis  Eliquis  (apixaban ) daily prior to admission, now on Eliquis  (apixaban ) daily.  Not a good candidate for Coumadin given history of ICH Therapy recommendations:  SNF Disposition:  return to SNF when medically appropriate   Hx of Stroke and ICH 04/2017: Admitted for headache, right upper  extremity numbness and blurry vision.  MRI showed multiple anterior circulation watershed infarcts with chronic posterior circulation watershed infarcts.  MRI showed left ICA chronic occlusion.  Chronic Doppler showed left ICA occlusion but right ICA patent.  TEE negative.  LDL 138.  Loop recorder placed but no A-fib found.  Aspirin  increased from 81->325 on discharge, along with Lipitor  80. 09/2022: Admitted for frequent falls and gait difficulty.  MRI showed bilateral multiple punctate cortical and subcortical infarcts.  And progressive leukoencephalopathy.  CT head and neck left ICA chronic occlusion.  Carotid Doppler right ICA patent.  Hypercoagulable workup positive for cardiolipin IgM and phosphatydalserine IgM.  EF 55 to 60%.  LDL 35, A1c 5.6.  Discharged on DAPT. 11/2022, follow-up with Dr. Rosemarie at [facility], repeat hypercoagulable workup, still positive for positive for cardiolipin IgM and phosphatydalserine IgM. At clinic, genetic testing for CADASIL and MELAS was consider but not sure if done. Also [place], not good candidate to refer him to rare genetic  disorder clinic at [facility] to get a definite diagnosis. Pt does have cognitive impairment, prescribed celexa  and aricept  06/2023 admitted for right parieto-occipital ICH with IVH, likely due to triple therapy in the setting of severe leukoencephalopathy and possible CAA.  Repeat CT head stable hematoma.  MRI showed stable hematoma but extensive cerebral microhemorrhages and hemosiderin deposition, concerning for CAA.  CT head and neck  absent left ICA with robust intracranial reconstitution, small carotid canal suggest left ICA occlusion early in life.  EF 60 to 65%, LDL 46, A1c 6.1.  Antithrombotic discontinued. Follow-up with Dr. Rosemarie at [facility] in 08/2023, no antithrombotic recommended 05/2024 admitted for ileus but found to have extensive left lower extremity DVT with extension to IVC with PE and possible intramural RV thrombus.  Started on heparin  IV  and then switched to Eliquis .  Antiphospholipid syndrome Large DVT from LLE to IVC Extensive chronic infarcts bilaterally 09/2022 Hypercoagulable workup positive for cardiolipin IgM and phosphatydalserine IgM.   11/2022 Repeat hypercoagulable workup again positive for cardiolipin IgM and phosphatydalserine IgM. Per record, started on eliquis  in 05/2023 along with ASA and plavix .  Stopped after 06/2019 for ICH. 05/2024 admitted for ileus but found to have extensive left lower extremity DVT with extension to IVC with PE and possible intramural RV thrombus.  Started on heparin  IV and then switched to Eliquis . Compliant with eliquis  at SNF per pharmacy records Continue Eliquis  for now, patient not good candidate for Coumadin given history of ICH  Severe leukoencephalopathy  Cognitive impairment Possible CAA MRI 04/2017 started to show severe bilateral leukoencephalopathy Much progressed in 05/2022 MRI Continue to severe bilateral leukoencephalopathy in MRIs in 09/2022 and this admission Etiology unclear, concerning for CADASIL, MELAS, or other rare genetic disorder Deemed not treatable, and not good candidate to refer him to rare genetic  disorder clinic at [facility] to get a definite diagnosis since living in [place]. Serial MRIs also showed progressive cerebral microhemorrhages and hemosiderin deposits, concerning for possible CAA Follows with Dr. Rosemarie at [facility]  Vision difficulty, chronic 09/2022 exam showed more difficulty counting fingers on the right than the left but at times he is able to count correctly on the right side. Pupils, round, and reactive to light with likely physiologic anisocoria with the left pupil slightly larger than the right  06/2023 examination showed bilateral difficulty with light perception, pupils reactive to light, right pupil larger than left, consistent with cortical blindness. Serial MRI and CT in the past showed extensive encephalomalacia which affects the bilateral cerebral  cortex roughly along the posterior division MCA territory and watershed area, and 06/2023 had right parietal occipital large ICH All of the above imaging changes and examinations are consistent with chronic vision deficit  Hypotension BP on the lower end Seems to be chronic Avoid low BP BP goal normotensive  Left ICA occlusion, chronic CTA showed Small carotid canal suggests occlusion early in life. Could be developmental This admission CTA head and neck pending  Tobacco abuse Current smoker Smoking cessation counseling provided Pt is willing to quit  Dysphagia Patient has post-stroke dysphagia SLP consulted On dysphagia 2 and seen liquid Advance diet as tolerated  Other stroke risk factors   Other Active Problems History of aseptic meningitis at age 69 Chronic ileus, on bowel regimen, continue supportive care Vascular dementia Neuropathy, on gabapentin  Hypokalemia, potassium 2.4--3.0--supplement  Hospital day # 0  Patient seen and examined by NP/APP with MD. MD to update note as needed.   Jorene Last, DNP, [name]-BC [facility] Pager: [phone]  ATTENDING NOTE: I reviewed above note and agree with the assessment and plan. Pt was seen and examined.   No family at bedside.  Patient lying in bed, lethargic, but awake, alert, eyes open, orientated to place, but not to time, age or situation. No aphasia, paucity of speech, following all simple commands with psychomotor slowing.  Naming 1/4, able to repeat simple  5 or sentence but not complicated sentences. No gaze palsy, tracking bilaterally, however left hemianopia, not blinking to visual threat on the left.  Left facial droop. Tongue midline.  Right upper extremity 4/5, left upper extremity mild drift.  Right lower extremity increased tone with right knee flexion position, left lower extremity 3/5.  Sensation and coordination not quite corporative, gait not tested.   For detailed assessment and plan, please  refer to above as I have made changes wherever appropriate.   Daniel Cummins, MD PhD Stroke Neurology 06/19/2024 3:58 PM  I spent additional 30 minutes inpatient face-to-face time with the patient, reviewing extensive test results, extensive images and medication, and discussing the diagnosis, treatment plan and potential prognosis. This patient's care requiresreview of multiple databases, neurological assessment, discussion with other specialists and medical decision making of high complexity.    To contact Stroke Continuity provider, please refer to [email or web address]. After hours, contact General Neurology

## 2024-06-19 NOTE — Evaluation (Signed)
 Physical Therapy Brief Evaluation and Discharge Note Patient Details Name: Daniel Stuart MRN: [id number] DOB: 01/11/1966 Today's Date: 06/19/2024   History of Present Illness  58 yo male SNF resident adm 06/18/24 with blurry vision. MRI showed acute right insular infarct. PMHx:ICH with residual weakness and bedbound, CVA, cortical blindness, HTN, HL, PVD, Carotid artery disease, antiphospholipid syndrome  Clinical Impression  Pt with flat affect, limited response to questions and from LTC SNF with pt reporting total care and lift at baseline. Pt states he no longer has therapy involved at facility and requires care for feeding as well as all mobility. Pt currently max-total +2 assist for rolling with noted RLE spasticity and at functional baseline. Will sign off and defer mobility to nursing and use of lift.        PT Assessment Patient does not need any further PT services  Assistance Needed at Discharge  Frequent or constant Supervision/Assistance    Equipment Recommendations None recommended by PT  Recommendations for Other Services       Precautions/Restrictions Precautions Precautions: Fall;Other (comment) Recall of Precautions/Restrictions: Impaired Precaution/Restrictions Comments: spastic hemiplegia, limited vision, incontinent        Mobility  Bed Mobility Rolling: Max assist, +2 for physical assistance, Used rails     General bed mobility comments: max +2 assist to roll left and total +2 to roll right  Transfers                        Ambulation/Gait              Home Activity Instructions    Stairs            Modified Rankin (Stroke Patients Only)        Balance                          Pertinent Vitals/Pain   Pain Assessment Pain Assessment: 0-10 Pain Score: 5  Pain Location: RLE with movement, spasm Pain Descriptors / Indicators: Spasm Pain Intervention(s): Limited activity within patient's tolerance,  Monitored during session, Repositioned     Home Living Family/patient expects to be discharged to:: Skilled nursing facility             Additional Comments: LTC at SNF    Prior Function Level of Independence: Needs assistance Comments: total assist for all ADLs, mobility and lift for OOB, no longer working with therapy per patient    UE/LE Assessment   UE ROM/Strength/Tone/Coordination: Impaired UE ROM/Strength/Tone/Coordination Deficits: hemiparesis with grossly 2-/5 movement RUE, LUE grossly 2/5 able to grasp rail with rolling  LE ROM/Strength/Tone/Coordination: Impaired LE ROM/Strength/Tone/Coordination Deficits: RLe spasm in flexed hip and knee position, unable to break into full extension with grossly 15 degree knee extension maintained, LLE 2-/5 with grossly 30 degree knee flexion    Communication   Communication Communication: Impaired Factors Affecting Communication: Difficulty expressing self;Reduced clarity of speech     Cognition Overall Cognitive Status: No family/caregiver present to determine baseline cognitive functioning       General Comments      Exercises     Assessment/Plan    PT Problem List         PT Visit Diagnosis Other abnormalities of gait and mobility (R26.89)    No Skilled PT Patient at baseline level of functioning;Patient will have necessary level of assist by caregiver at discharge   Co-evaluation  AMPAC 6 Clicks Help needed turning from your back to your side while in a flat bed without using bedrails?: Total Help needed moving from lying on your back to sitting on the side of a flat bed without using bedrails?: Total Help needed moving to and from a bed to a chair (including a wheelchair)?: Total Help needed standing up from a chair using your arms (e.g., wheelchair or bedside chair)?: Total Help needed to walk in hospital room?: Total Help needed climbing 3-5 steps with a railing? : Total 6 Click  Score: 6      End of Session   Activity Tolerance: Patient tolerated treatment well Patient left: in bed;with call bell/phone within reach;with nursing/sitter in room Nurse Communication: Mobility status PT Visit Diagnosis: Other abnormalities of gait and mobility (R26.89)     Time: 9156-9145 PT Time Calculation (min) (ACUTE ONLY): 11 min  Charges:   PT Evaluation $PT Eval Low Complexity: 1 Low      Yaire Kreher P, PT Acute Rehabilitation Services Office: [phone]   Lenoard NOVAK Antoinette Borgwardt  06/19/2024, 10:22 AM

## 2024-06-19 NOTE — Progress Notes (Addendum)
 Dr Keturah notified of MRI results from 2130 and stage 3 pressure injury to R lateral ankle. WOC consult placed, see media pics. Unable to complete admission questions-pt oriented only to self

## 2024-06-19 NOTE — Plan of Care (Signed)
  Problem: Education: Goal: Knowledge of General Education information will improve Description: Including pain rating scale, medication(s)/side effects and non-pharmacologic comfort measures Outcome: Progressing   Problem: Health Behavior/Discharge Planning: Goal: Ability to manage health-related needs will improve Outcome: Progressing   Problem: Clinical Measurements: Goal: Ability to maintain clinical measurements within normal limits will improve Outcome: Progressing Goal: Will remain free from infection Outcome: Progressing Goal: Diagnostic test results will improve Outcome: Progressing Goal: Respiratory complications will improve Outcome: Progressing Goal: Cardiovascular complication will be avoided Outcome: Progressing   Problem: Activity: Goal: Risk for activity intolerance will decrease Outcome: Progressing   Problem: Nutrition: Goal: Adequate nutrition will be maintained Outcome: Progressing   Problem: Coping: Goal: Level of anxiety will decrease Outcome: Progressing   Problem: Elimination: Goal: Will not experience complications related to bowel motility Outcome: Progressing   Problem: Pain Managment: Goal: General experience of comfort will improve and/or be controlled Outcome: Progressing   Problem: Skin Integrity: Goal: Risk for impaired skin integrity will decrease Outcome: Progressing   Problem: Education: Goal: Knowledge of disease or condition will improve Outcome: Progressing   Problem: Coping: Goal: Will identify appropriate support needs Outcome: Progressing

## 2024-06-19 NOTE — Plan of Care (Signed)
  Problem: Clinical Measurements: Goal: Ability to maintain clinical measurements within normal limits will improve Outcome: Progressing   Problem: Coping: Goal: Level of anxiety will decrease Outcome: Progressing   Problem: Elimination: Goal: Will not experience complications related to bowel motility Outcome: Progressing Goal: Will not experience complications related to urinary retention Outcome: Progressing   Problem: Pain Managment: Goal: General experience of comfort will improve and/or be controlled Outcome: Progressing

## 2024-06-19 NOTE — TOC Initial Note (Signed)
 Transition of Care [facility]) - Initial/Assessment Note    Patient Details  Name: Daniel Stuart MRN: [id number] Date of Birth: 10-16-1966  Transition of [facility]) CM/SW Contact:    Isaiah Public, LCSWA Phone Number: 06/19/2024, 11:03 AM  Clinical Narrative:                  Due to patients current orientation CSW spoke with patients sister [name] who confirmed that PTA patient comes from [facility]. [name] confirmed plan is for patient to return back to [facility] when medically stable for dc. CSW spoke with [place] with [facility] who confirmed patient can return when medically stable. CSW will continue to follow and assist with patients dc planning needs.       Patient Goals and CMS Choice            Expected Discharge Plan and Services                                              Prior Living Arrangements/Services                       Activities of Daily Living      Permission Sought/Granted                  Emotional Assessment              Admission diagnosis:  Hypokalemia [E87.6] Blurred vision [H53.8] Patient Active Problem List   Diagnosis Date Noted   Acute DVT (deep venous thrombosis) (HCC) 05/08/2024   airspace disease 04/13/2024   Prolonged QT interval 04/13/2024   Ileus (HCC) 03/05/2024   Edema of right upper arm 02/28/2024   Protein calorie malnutrition (HCC) 02/27/2024   Pressure injury of skin 02/24/2024   Colonic pseudoobstruction 02/23/2024   Colitis 02/23/2024   Hypokalemia 02/23/2024   History of intracranial hemorrhage 02/23/2024   Essential hypertension 02/23/2024   Antiphospholipid syndrome (HCC) 02/23/2024   Left carotid artery occlusion 09/09/2022   Weakness of both lower extremities 09/07/2022   Bedbound 08/25/2022   PAD (peripheral artery disease) (HCC) 08/25/2022   Arrhythmia as indication for cardiac pacemaker replacement 03/11/2020   Vitamin D  deficiency 09/13/2019   History of  intracranial hemorrhage History of severe encephalopathy History of CVA/TIA/progressive leukoencephalopathy and chronic left ICA stenosis 03/12/2019   Right sided weakness 03/12/2019   Anxiety 03/12/2019   PVD (peripheral vascular disease) (HCC) 04/12/2017   Carotid arterial disease (HCC) 04/12/2017   PCP:  Paseda, Folashade R, [name] Pharmacy:   [facility] - [place], [place] - [address] [place] [address] Phone: [phone] Fax: [phone]     Social Drivers of Health (SDOH) Social History: SDOH Screenings   Food Insecurity: No Food Insecurity (05/08/2024)  Housing: Low Risk  (05/08/2024)  Transportation Needs: No Transportation Needs (04/13/2024)  Utilities: Not At Risk (05/08/2024)  Alcohol Screen: Low Risk  (07/31/2021)  Depression (PHQ2-9): Low Risk  (04/13/2022)  Financial Resource Strain: Medium Risk (07/31/2021)  Physical Activity: Inactive (07/31/2021)  Social Connections: Socially Isolated (07/31/2021)  Stress: No Stress Concern Present (07/31/2021)  Tobacco Use: High Risk (05/07/2024)   SDOH Interventions:     Readmission Risk Interventions    04/16/2024   10:19 AM  Readmission Risk Prevention Plan  Transportation Screening Complete  PCP or Specialist Appt within  5-7 Days Complete  Home Care Screening Complete  Medication Review (RN CM) Complete

## 2024-06-19 NOTE — Progress Notes (Signed)
 PROGRESS NOTE    Daniel Stuart  FMW:[id number] DOB: [birth year] DOA: 06/18/2024 PCP: Paseda, Folashade R, [name]    Brief Narrative:  Patient is 58 year old gentleman from nursing home, chronically ill and bedbound status, multiple previous strokes, previous intracranial hemorrhage, recent admission due to extensive thromboembolism and is started on Eliquis  sent to the hospital with complaints of blurry vision.  Patient with flat affect.  Poor historian.  He also has chronic ileus and has poor appetite.  In the emergency room, he he had significant hypokalemia which is mostly chronic.  Potassium 2.4.  Creatinine 1.3.  CT head was negative, MRI subsequently showed ischemic infarct.  Admitted with acute stroke, electrolyte abnormalities  Subjective: Patient seen and examined.  Flat affect.  He is not very forthcoming with his complaints or interaction.  Patient tells me he feels fine.  He tells me he is always have blurry vision.  Called and discussed with patient's sister for details information. Assessment & Plan:   Acute ischemic stroke: In a patient with previous multiple stroke, right hemiparesis. Clinical findings, blurry vision, acute on chronic.  Chronic weakness right more than left. CT head findings, no acute findings.  Encephalomalacia. MRI of the brain, acute right insular and overlying operculum infarct.  Multiple areas of encephalomalacia and extensive chronic microvascular ischemic disease. CT angiogram head and neck, pending 2D echocardiogram, recently done.  Ejection fraction 65 to 70%.  No intracardiac thrombus.  No shunt. Antiplatelet therapy, on Eliquis  at home.  On hold. LDL, pending. Hemoglobin A1c, pending. Recent thromboembolism and suspected antiphospholipid syndrome.  Therapeutic on Eliquis  at nursing home at least for the last month.  Does have poor absorption due to chronic ileus. Neurology following-May benefit with being on Lovenox  injection given poor absorption  and compliance issues. PT/OT, however patient is mostly bedbound. Speech following, n.p.o. until seen by the speech.  Chronic ileus, severe hypokalemia: Potassium 2.4.  Replaced aggressively.  Still at 3.  Continue potassium 40 twice daily, additional potassium 40 IV today.  Recheck levels tomorrow morning.  Magnesium  is adequate.       DVT prophylaxis: SCDs   Code Status: Full code, needs palliative discussion.  Will consult palliative care team. Family Communication: Sister called and updated Disposition Plan: Status is: Observation The patient will require care spanning > 2 midnights and should be moved to inpatient because: Acute stroke workup, significant potassium deficiency     Consultants:  Neurology Palliative care  Procedures:  None  Antimicrobials:  None     Objective: Vitals:   06/19/24 0026 06/19/24 0051 06/19/24 0521 06/19/24 0744  BP: 102/70  97/69   Pulse: 84  81   Resp: 12  15   Temp: 98.2 F (36.8 C)  97.9 F (36.6 C) 98.7 F (37.1 C)  TempSrc: Oral  Oral Oral  SpO2: 100%  99%   Weight:  59.9 kg    Height:        Intake/Output Summary (Last 24 hours) at 06/19/2024 1057 Last data filed at 06/19/2024 0949 Gross per 24 hour  Intake 192.97 ml  Output --  Net 192.97 ml   Filed Weights   06/18/24 1349 06/19/24 0051  Weight: 68 kg 59.9 kg    Examination:  General exam: Appears calm and comfortable.  Chronically sick looking.  Older than stated age. Flat affect. Respiratory system: No added sounds. Cardiovascular system: S1 & S2 heard, RRR. No pedal edema. Gastrointestinal system: Soft.  Distended.  Bowel sounds present. Central nervous system: Alert  and awake.  Mostly oriented. Right facial droop.  Gross generalized weakness.  Right upper and lower extremity with power 2/5, contracted.    Data Reviewed: I have personally reviewed following labs and imaging studies  CBC: Recent Labs  Lab 06/18/24 1511 06/19/24 0530  WBC 8.6 9.2   NEUTROABS 5.9  --   HGB 12.8* 12.5*  HCT 39.8 38.4*  MCV 84.9 84.2  PLT 254 262   Basic Metabolic Panel: Recent Labs  Lab 06/18/24 1511 06/19/24 0530  NA 147* 146*  K 2.4* 3.0*  CL 113* 113*  CO2 24 22  GLUCOSE 101* 87  BUN 19 12  CREATININE 1.31* 1.22  CALCIUM  8.6* 7.9*  MG 1.8 2.0   GFR: Estimated Creatinine Clearance: 54.6 mL/min (by C-G formula based on SCr of 1.22 mg/dL). Liver Function Tests: Recent Labs  Lab 06/18/24 1511  AST 17  ALT 12  ALKPHOS 99  BILITOT 0.5  PROT 6.5  ALBUMIN 2.5*   No results for input(s): LIPASE, AMYLASE in the last 168 hours. Recent Labs  Lab 06/18/24 1556  AMMONIA <13   Coagulation Profile: Recent Labs  Lab 06/19/24 0530  INR 1.3*   Cardiac Enzymes: No results for input(s): CKTOTAL, CKMB, CKMBINDEX, TROPONINI in the last 168 hours. BNP (last 3 results) No results for input(s): PROBNP in the last 8760 hours. HbA1C: No results for input(s): HGBA1C in the last 72 hours. CBG: No results for input(s): GLUCAP in the last 168 hours. Lipid Profile: No results for input(s): CHOL, HDL, LDLCALC, TRIG, CHOLHDL, LDLDIRECT in the last 72 hours. Thyroid  Function Tests: No results for input(s): TSH, T4TOTAL, FREET4, T3FREE, THYROIDAB in the last 72 hours. Anemia Panel: No results for input(s): VITAMINB12, FOLATE, FERRITIN, TIBC, IRON, RETICCTPCT in the last 72 hours. Sepsis Labs: No results for input(s): PROCALCITON, LATICACIDVEN in the last 168 hours.  Recent Results (from the past 240 hours)  Resp panel by RT-PCR (RSV, Flu A&B, Covid) Anterior Nasal Swab     Status: None   Collection Time: 06/18/24  4:15 PM   Specimen: Anterior Nasal Swab  Result Value Ref Range Status   SARS Coronavirus 2 by RT PCR NEGATIVE NEGATIVE Final   Influenza A by PCR NEGATIVE NEGATIVE Final   Influenza B by PCR NEGATIVE NEGATIVE Final    Comment: (NOTE) The Xpert Xpress SARS-CoV-2/FLU/RSV plus  assay is intended as an aid in the diagnosis of influenza from Nasopharyngeal swab specimens and should not be used as a sole basis for treatment. Nasal washings and aspirates are unacceptable for Xpert Xpress SARS-CoV-2/FLU/RSV testing.  Fact Sheet for Patients: BloggerCourse.com  Fact Sheet for Healthcare Providers: SeriousBroker.it  This test is not yet approved or cleared by the [place]  FDA and has been authorized for detection and/or diagnosis of SARS-CoV-2 by FDA under an Emergency Use Authorization (EUA). This EUA will remain in effect (meaning this test can be used) for the duration of the COVID-19 declaration under Section 564(b)(1) of the Act, 21 U.S.C. section 360bbb-3(b)(1), unless the authorization is terminated or revoked.     Resp Syncytial Virus by PCR NEGATIVE NEGATIVE Final    Comment: (NOTE) Fact Sheet for Patients: BloggerCourse.com  Fact Sheet for Healthcare Providers: SeriousBroker.it  This test is not yet approved or cleared by the [place]  FDA and has been authorized for detection and/or diagnosis of SARS-CoV-2 by FDA under an Emergency Use Authorization (EUA). This EUA will remain in effect (meaning this test can be used) for the duration of the  COVID-19 declaration under Section 564(b)(1) of the Act, 21 U.S.C. section 360bbb-3(b)(1), unless the authorization is terminated or revoked.  Performed at [facility], [address], [place], [place] [address]          Radiology Studies: MR Brain W and Wo Contrast Result Date: 06/18/2024 CLINICAL DATA:  Stroke, follow up patient reporting blurred vision, new onset - difficult to discern further history EXAM: MRI HEAD WITHOUT AND WITH CONTRAST TECHNIQUE: Multiplanar, multiecho pulse sequences of the brain and surrounding structures were obtained without and with intravenous contrast.  CONTRAST:  6mL GADAVIST  GADOBUTROL  1 MMOL/ML IV SOLN COMPARISON:  MRI head 05/08/2024. FINDINGS: Brain: Acute right insular and overlying operculuminfarct involving both cortex and subcortical white matter with associated restricted diffusion. Associated edema without mass effect or midline shift. Bilateral parieto-occipital and bifrontal encephalomalacia. Evidence of prior hemorrhage in the right parietal and occipital region. Extensive confluent T2/FLAIR hyperintensity in the white matter. Vascular: Normal flow voids. Skull and upper cervical spine: Normal marrow signal. Sinuses/Orbits: Negative. IMPRESSION: 1. Acute right insular and overlying operculum infarct. No mass effect. 2. Multiple areas of encephalomalacia and extensive chronic microvascular ischemic disease. Electronically Signed   By: Gilmore GORMAN Molt M.D.   On: 06/18/2024 22:05   CT ABDOMEN PELVIS W CONTRAST Result Date: 06/18/2024 CLINICAL DATA:  Abdominal distension. EXAM: CT ABDOMEN AND PELVIS WITH CONTRAST TECHNIQUE: Multidetector CT imaging of the abdomen and pelvis was performed using the standard protocol following bolus administration of intravenous contrast. RADIATION DOSE REDUCTION: This exam was performed according to the departmental dose-optimization program which includes automated exposure control, adjustment of the mA and/or kV according to patient size and/or use of iterative reconstruction technique. CONTRAST:  75mL OMNIPAQUE  IOHEXOL  350 MG/ML SOLN COMPARISON:  CT chest abdomen and pelvis 04/13/2024. FINDINGS: Lower chest: Patchy ground-glass opacities are seen in both lung bases. Airspace disease in the right middle lobe has decreased, but has not completely resolved. Hepatobiliary: No focal liver abnormality is seen. No gallstones, gallbladder wall thickening, or biliary dilatation. Pancreas: Unremarkable. No pancreatic ductal dilatation or surrounding inflammatory changes. Spleen: Normal in size without focal abnormality.  Adrenals/Urinary Tract: There is scarring in the inferior pole the right kidney. There are punctate bilateral renal calculi measuring up to 5 mm. These are unchanged. There is no hydronephrosis or perinephric fluid. The adrenal glands and bladder are within normal limits. Stomach/Bowel: The colon is diffusely dilated with air-fluid levels to the level the rectum measuring up to 8.7 cm. There is no twisting or mesenteric edema identified. No colonic wall thickening identified. The appendix appears within normal limits. Small bowel and stomach are within normal limits. No pneumatosis or free air. Vascular/Lymphatic: Aorta and IVC are normal in size per there are findings concerning for nonocclusive thrombus within the inferior vena cava left common femoral vein, and left external iliac vein. No enlarged lymph nodes are identified. Reproductive: Prostate is unremarkable. Other: No abdominal wall hernia or abnormality. No abdominopelvic ascites. Musculoskeletal: No fracture is seen. IMPRESSION: 1. Diffuse colonic dilatation with air-fluid levels to the level of the rectum. Findings are concerning for colonic ileus. No evidence for bowel obstruction. 2. Findings nonocclusive thrombus within the inferior vena cava, left common femoral vein, and left external iliac vein. 3. Nonobstructing bilateral renal calculi. 4. Patchy ground-glass opacities in both lung bases, likely infectious/inflammatory. Electronically Signed   By: Greig Pique M.D.   On: 06/18/2024 17:13   CT Head Wo Contrast Result Date: 06/18/2024 CLINICAL DATA:  Bilateral blurred vision  for greater than 24 hours EXAM: CT HEAD WITHOUT CONTRAST TECHNIQUE: Contiguous axial images were obtained from the base of the skull through the vertex without intravenous contrast. RADIATION DOSE REDUCTION: This exam was performed according to the departmental dose-optimization program which includes automated exposure control, adjustment of the mA and/or kV according to  patient size and/or use of iterative reconstruction technique. COMPARISON:  05/07/2024 FINDINGS: Brain: Stable diffuse cerebral atrophy. Stable chronic ischemic changes are again seen throughout the bilateral subcortical and periventricular white matter, with stable encephalomalacia in the right occipital region. No evidence of acute infarct or hemorrhage. The lateral ventricles and midline structures are unremarkable. No acute extra-axial fluid collections. No mass effect. Vascular: No hyperdense vessel or unexpected calcification. Skull: Normal. Negative for fracture or focal lesion. Sinuses/Orbits: No acute finding. Other: None. IMPRESSION: 1. No acute intracranial process. 2. Stable cortical atrophy, encephalomalacia, and chronic ischemic changes as above. Electronically Signed   By: Ozell Daring M.D.   On: 06/18/2024 17:11   DG Chest Portable 1 View Result Date: 06/18/2024 CLINICAL DATA:  Visual changes EXAM: PORTABLE CHEST 1 VIEW COMPARISON:  02/26/2024 FINDINGS: Single frontal view of the chest demonstrates a stable cardiac silhouette. Stable bibasilar scarring. No airspace disease, effusion, or pneumothorax. No acute bony abnormalities. IMPRESSION: 1. No acute intrathoracic process. Electronically Signed   By: Ozell Daring M.D.   On: 06/18/2024 16:16        Scheduled Meds:  acidophilus  2 capsule Oral Daily   cholecalciferol   1,000 Units Oral q morning   cyanocobalamin   1,000 mcg Oral Daily   gabapentin   300 mg Oral BID   liver oil-zinc  oxide   Topical BID   nutrition supplement (JUVEN)  1 packet Oral BID BM   pantoprazole   40 mg Oral QHS   polyethylene glycol  17 g Oral BID   potassium chloride   40 mEq Oral BID   simethicone   80 mg Oral QID   sodium bicarbonate   650 mg Oral BID   sodium chloride  flush  3 mL Intravenous Q12H   Continuous Infusions:  sodium chloride  40 mL/hr at 06/19/24 0047   sodium chloride      potassium chloride  10 mEq (06/19/24 0944)     LOS: 0 days     Time spent: 55 minutes    Renato Applebaum, MD [facility]

## 2024-06-19 NOTE — Consult Note (Signed)
 WOC Nurse Consult Note: patient known to WOC team from previous admission with pressure injury to R malleolus  Reason for Consult: R ankle wound  Wound type:1.  Stage 3 Pressure Injury /R lateral malleolus appears red and moist  2.  Stage 2 Pressure Injury R buttock red moist  Pressure Injury POA: Yes Measurement: see nursing flowsheet  Wound bed: as above  Drainage (amount, consistency, odor)  see nursing flowsheet  Periwound: Dressing procedure/placement/frequency:  Cleanse R buttock wound with NS, apply Xeroform gauze (Lawson [id number]) to wound bed daily and secure with silicone foam or ABD pad and tape whichever is preferred.  Cleanse ankle wound with NS, apply silver hydrofiber (Aquacel ANDRIA Collum [id number]) cut to fit wound bed daily and secure with silicone foam.  Attempt to keep pressure off area by offloading with pillows.  POC discussed with bedside nurse. WOC team will not follow.  Please reconsult if further needs arise.   Patient resides at [facility] and should have ongoing care by physician/wound care team at facility.   Thank you,    Powell Bar MSN, RN-BC, [employer]

## 2024-06-19 NOTE — Consult Note (Addendum)
 NEUROLOGY CONSULT NOTE   Date of service: June 19, 2024 Patient Name: Daniel Stuart MRN:  [id number] DOB:  07/06/1966 Chief Complaint: Worsening blurry vision Requesting Provider: Sonjia Held, MD  History of Present Illness  Daniel Stuart is a 58 y.o. male with hx of probable antiphospholipid syndrome, right parieto-occipital ICH (in the setting of triple therapy, 2024), multiple ischemic strokes (residual right-sided spastic hemiplegia, right gaze preference, cortical blindness) with resulting leukoencephalopathy, left ICA occlusion, HTN, smoking history who presented from nursing home for acute on chronic blurry vision.  Reports that he woke up his usual self morning of 8/18, however later developed worsening blurry vision, however unable to elaborate on what he was doing at the time or timing of onset. Denies any additional new symptoms, such as new headache, weakness, numbness. He was admitted for observation for hypokalemia of 2.4. MRI brain obtained showed acute right insular infarct.  Of note, patient has extensive history of ischemic strokes prior to 2018, found to have probable antiphospholipid syndrome (testing in 09/2022 followed by 11/2022 & therefore not 12 weeks apart). Atrial fibrillation was not detected on more than 3 years of monitoring after previous stroke in 2018. History is complicated by right parieto-occipital ICH in the setting of triple therapy in 06/2023. At that time had been on DAPT for an ischemic stroke. Plavix  was then replaced with Eliquis . After a week on Eliquis , presented with ICH, at which time Eliquis  was recommended to be discontinued. Last seen by Neurology outpatient in 08/2023. However in 05/2024, was placed back on Eliquis  for multiple emboli, including pulmonary. He reports, however, that he has not been taking his Eliquis . This is in contrast with reports of last dose being evening of 8/17.   LKW: 8/18 morning, unknown specific time Modified  rankin score: 5-Severe disability-bedridden, incontinent, needs constant attention IV Thrombolysis: No (on Eliquis , history of ICH) EVT: No (reason)  NIHSS components Score: Comment  1a Level of Conscious 0[]  1[x]  2[]  3[]      1b LOC Questions 0[x]  1[]  2[]       1c LOC Commands 0[x]  1[]  2[]       2 Best Gaze 0[]  1[x]  2[]     Right gaze preference  3 Visual 0[]  1[]  2[]  3[x]    Cannot report seeing fingers in any quadrant and does not consistently blink to threat  4 Facial Palsy 0[]  1[x]  2[]  3[]      5a Motor Arm - left 0[]  1[x]  2[]  3[]  4[]  UN[]    5b Motor Arm - Right 0[]  1[x]  2[]  3[]  4[]  UN[]    6a Motor Leg - Left 0[]  1[]  2[]  3[x]  4[]  UN[]  Spastic  6b Motor Leg - Right 0[]  1[]  2[]  3[x]  4[]  UN[]    7 Limb Ataxia 0[x]  1[]  2[]  UN[]    UTA lower extremities  8 Sensory 0[]  1[]  2[x]  UN[]      9 Best Language 0[x]  1[]  2[]  3[]      10 Dysarthria 0[x]  1[]  2[]  UN[]      11 Extinct. and Inattention 0[x]  1[]  2[]       TOTAL: 16 (chronic)      ROS  Comprehensive ROS performed and pertinent positives documented in HPI    Past History   Past Medical History:  Diagnosis Date   Anxiety    Arrhythmia as indication for cardiac pacemaker replacement 03/2020   Frequent falls 03/2020   History of loop recorder 04/2017   ICH (intracerebral hemorrhage) (HCC) 06/19/2023   Stroke (cerebrum) (HCC)    Tobacco use    Urinary  frequency 09/2019   Vitamin D  deficiency     Past Surgical History:  Procedure Laterality Date   FOOT SURGERY     LOOP RECORDER INSERTION N/A 04/13/2017   Procedure: Loop Recorder Insertion;  Surgeon: Waddell Danelle ORN, MD;  Location: [facility] INVASIVE [facility];  Service: Cardiovascular;  Laterality: N/A;   TEE WITHOUT CARDIOVERSION N/A 04/13/2017   Procedure: TRANSESOPHAGEAL ECHOCARDIOGRAM (TEE);  Surgeon: Jeffrie Oneil BROCKS, MD;  Location: [facility] ENDOSCOPY;  Service: Cardiovascular;  Laterality: N/A;    Family History: Family History  Problem Relation Age of Onset   Diabetes Mother    COPD Mother     Diabetes Father    Heart failure Father    Cancer Sister     Social History  reports that he has been smoking cigarettes. He started smoking about 46 years ago. He has a 46.6 pack-year smoking history. He has never used smokeless tobacco. He reports that he does not drink alcohol and does not use drugs.  No Known Allergies  Medications   Current Facility-Administered Medications:    0.9 %  sodium chloride  infusion, , Intravenous, Continuous, Pokhrel, Laxman, MD, Last Rate: 40 mL/hr at 06/19/24 0047, New Bag at 06/19/24 0047   0.9 %  sodium chloride  infusion, 250 mL, Intravenous, PRN, Pokhrel, Laxman, MD   acetaminophen  (TYLENOL ) tablet 650 mg, 650 mg, Oral, Q6H PRN, Pokhrel, Laxman, MD   acidophilus (RISAQUAD) capsule 2 capsule, 2 capsule, Oral, Daily, Pokhrel, Laxman, MD   bisacodyl  (DULCOLAX) suppository 10 mg, 10 mg, Rectal, Daily PRN, Pokhrel, Laxman, MD   cyanocobalamin  (VITAMIN B12) tablet 1,000 mcg, 1,000 mcg, Oral, Daily, Pokhrel, Laxman, MD   gabapentin  (NEURONTIN ) capsule 300 mg, 300 mg, Oral, BID, Pokhrel, Laxman, MD   ipratropium-albuterol  (DUONEB) 0.5-2.5 (3) MG/3ML nebulizer solution 3 mL, 3 mL, Nebulization, Q6H PRN, Pokhrel, Laxman, MD   nutrition supplement (JUVEN) (JUVEN) powder packet 1 packet, 1 packet, Oral, BID BM, Pokhrel, Laxman, MD   pantoprazole  (PROTONIX ) EC tablet 40 mg, 40 mg, Oral, QHS, Pokhrel, Laxman, MD, 40 mg at 06/18/24 2256   potassium chloride  (KLOR-CON ) packet 40 mEq, 40 mEq, Oral, BID, Pokhrel, Laxman, MD, 40 mEq at 06/18/24 2256   simethicone  (MYLICON) chewable tablet 80 mg, 80 mg, Oral, QID, Pokhrel, Laxman, MD   sodium chloride  flush (NS) 0.9 % injection 3 mL, 3 mL, Intravenous, Q12H, Pokhrel, Laxman, MD, 3 mL at 06/18/24 2257   sodium chloride  flush (NS) 0.9 % injection 3 mL, 3 mL, Intravenous, PRN, Pokhrel, Laxman, MD  Vitals   Vitals:   06/18/24 1810 06/18/24 2310 06/19/24 0026 06/19/24 0051  BP:  121/88 102/70   Pulse:  86 84   Resp:  14  12   Temp: 98.5 F (36.9 C)  98.2 F (36.8 C)   TempSrc: Oral  Oral   SpO2:  100% 100%   Weight:    59.9 kg  Height:        Body mass index is 21.31 kg/m.   Physical Exam   Constitutional: Appears in pain, overall frail Psych: Affect appropriate to situation.  Eyes: No scleral injection.  HENT: No OP obstruction.  Head: Normocephalic.  Cardiovascular: Normal rate and regular rhythm on monitor Respiratory: Effort normal, non-labored breathing.  GI: Distended, soft. Skin: WDI  Neurologic Examination   Mental status: Does not answer orientation questions. Unable to give clear history (I don't know). Speech/language: No dysarthria or aphasia. Able to follow commands however requires repeated prompting. Cranial nerves:   CN II Pupils reactive.  UTA further due to patient squeezing eyes closed.   CN III,IV,VI Right gaze preference but able to look to left. Does not track.   CN V Normal sensation in V1, V2, and V3 segments bilaterally    CN VII Right facial droop.   CN VIII normal hearing to speech    CN IX & X normal palatal elevation, no uvular deviation    CN XI 5/5 shoulder shrug   CN XII Tongue deviates to the right   Motor:  Muscle bulk: decreased Tone: increased in bilateral lower extremities, however confounded by pain Pronator drift: present bilaterally Tremor: absent Motor: RUE: shoulder abduction 3/5, biceps 3/5, triceps 3/5, hand grip 4+/5 LUE: shoulder abduction 3/5, biceps 3/5, triceps 3/5, hand grip 5/5 RLE: at least 2/5 (limited movement due to triggering pain) LLE: 1/5 (contracted)  Reflexes: RUE: 2+ brisk throughout LUE: 2+ brisk throughout RLE: patellar 3+, ankle 2+ LLE: patellar 1+, ankle 2+ Babinski: present bilaterally  Sensation: Inconsistent (able to report being touched but cannot say where)  Coordination/Complex Motor:  - Finger to Nose: intact - Heel to shin: UTA - Gait: deferred (wheelchair bound)    Labs/Imaging/Neurodiagnostic  studies   CBC:  Recent Labs  Lab 07/10/24 1511  WBC 8.6  NEUTROABS 5.9  HGB 12.8*  HCT 39.8  MCV 84.9  PLT 254   Basic Metabolic Panel:  Lab Results  Component Value Date   NA 147 (H) 07-10-24   K 2.4 (LL) 07/10/24   CO2 24 07-10-24   GLUCOSE 101 (H) Jul 10, 2024   BUN 19 July 10, 2024   CREATININE 1.31 (H) 2024/07/10   CALCIUM  8.6 (L) 07-10-2024   GFRNONAA >60 2024-07-10   GFRAA 80 06/11/2020   Lipid Panel:  Lab Results  Component Value Date   LDLCALC 46 06/20/2023   HgbA1c:  Lab Results  Component Value Date   HGBA1C 5.8 (H) 03/01/2024   Urine Drug Screen:     Component Value Date/Time   LABOPIA NONE DETECTED July 11, 2023 2259   COCAINSCRNUR NONE DETECTED 2023/07/11 2259   LABBENZ NONE DETECTED 11-Jul-2023 2259   AMPHETMU NONE DETECTED 07/11/23 2259   THCU NONE DETECTED 2023-07-11 2259   LABBARB NONE DETECTED 07/11/2023 2259    Alcohol Level     Component Value Date/Time   ETH <10 07-11-23 0858   INR  Lab Results  Component Value Date   INR 1.2 07-11-23   APTT  Lab Results  Component Value Date   APTT 28 07-11-23   AED levels: No results found for: PHENYTOIN, ZONISAMIDE, LAMOTRIGINE, LEVETIRACETA  CT Head without contrast(Personally reviewed): No acute hemorrhage.  MRI Brain(Personally reviewed): Acute R insular infarct. Extensive T2/FLAIR signal change of white matter. Encephalomalacia bilaterally in anterior and posterior circulation territories.  Echocardiogram (05/08/2024): Normal EF. No evidence of thrombus.   Echocardiogram (09/08/2022): No shunt.  ASSESSMENT   Murrel E Tussey is a 58 y.o. male with history of multiple ischemic strokes (iso possible antiphospholipid syndrome), R parieto-occipital ICH (iso triple therapy), multiple VTE's who presents with reports of worsening blurry vision for more than 24 hours. Currently admitted for observation for hypokalemia and found to have acute R insular infarct on MRI brain.  Although patient denies it, appears to be receiving Eliquis  in nursing home. If confirmed to be true, then will have failed on Eliquis . Etiology of stroke likely due to antiphospholipid syndrome.   RECOMMENDATIONS  Confirm anticoagulation compliance Consider Hematology consult Consider changing from Eliquis  to Warfarin as the latter may be more effective  in those with antiphospholipid syndrome Repeat carotid ultrasound for R ICA Continue high-intensity statin therapy with atorvastatin  80mg   ______________________________________________________________________    Signed, Cadance Raus M Lanina Larranaga, MD [facility]   I personally spent a total of 63 minutes in the care of the patient today including preparing to see the patient, performing a medically appropriate exam/evaluation, counseling and educating, referring and communicating with other health care professionals, independently interpreting results, and communicating results.

## 2024-06-20 DIAGNOSIS — I639 Cerebral infarction, unspecified: Secondary | ICD-10-CM | POA: Diagnosis not present

## 2024-06-20 DIAGNOSIS — E876 Hypokalemia: Secondary | ICD-10-CM | POA: Diagnosis not present

## 2024-06-20 DIAGNOSIS — D6861 Antiphospholipid syndrome: Secondary | ICD-10-CM

## 2024-06-20 DIAGNOSIS — Z7189 Other specified counseling: Secondary | ICD-10-CM | POA: Diagnosis not present

## 2024-06-20 DIAGNOSIS — H538 Other visual disturbances: Secondary | ICD-10-CM

## 2024-06-20 DIAGNOSIS — Z8679 Personal history of other diseases of the circulatory system: Secondary | ICD-10-CM

## 2024-06-20 DIAGNOSIS — Z515 Encounter for palliative care: Secondary | ICD-10-CM

## 2024-06-20 LAB — BASIC METABOLIC PANEL WITH GFR
Anion gap: 17 — ABNORMAL HIGH (ref 5–15)
BUN: 10 mg/dL (ref 6–20)
CO2: 15 mmol/L — ABNORMAL LOW (ref 22–32)
Calcium: 7.8 mg/dL — ABNORMAL LOW (ref 8.9–10.3)
Chloride: 113 mmol/L — ABNORMAL HIGH (ref 98–111)
Creatinine, Ser: 1.14 mg/dL (ref 0.61–1.24)
GFR, Estimated: >60 mL/min (ref >60)
Glucose, Bld: 79 mg/dL (ref 70–99)
Potassium: 2.9 mmol/L — ABNORMAL LOW (ref 3.5–5.1)
Sodium: 145 mmol/L (ref 135–145)

## 2024-06-20 MED ORDER — POTASSIUM CHLORIDE 20 MEQ PO PACK
40.0000 meq | PACK | Freq: Three times a day (TID) | ORAL | Status: DC
  Administered 2024-06-20 (×3): 40 meq via ORAL
  Filled 2024-06-20 (×3): qty 2

## 2024-06-20 MED ORDER — APIXABAN 5 MG PO TABS
5.0000 mg | ORAL_TABLET | Freq: Two times a day (BID) | ORAL | Status: DC
  Administered 2024-06-20 – 2024-06-21 (×3): 5 mg via ORAL
  Filled 2024-06-20 (×3): qty 1

## 2024-06-20 NOTE — Progress Notes (Addendum)
 STROKE TEAM PROGRESS NOTE   INTERIM HISTORY/SUBJECTIVE Seen in room, no family at the bedside. Endorses compliance with eliquis  and all medications at SNF. MRI shows ischemic infarct. Continue Elqius. Exam stable today. Hemodynamically stable as well. CTA unchanged from prior scans.   OBJECTIVE  CBC    Component Value Date/Time   WBC 9.2 06/19/2024 0530   RBC 4.56 06/19/2024 0530   HGB 12.5 (L) 06/19/2024 0530   HGB 15.3 06/11/2020 1041   HCT 38.4 (L) 06/19/2024 0530   HCT 44.9 06/11/2020 1041   PLT 262 06/19/2024 0530   PLT 230 06/11/2020 1041   MCV 84.2 06/19/2024 0530   MCV 93 06/11/2020 1041   MCH 27.4 06/19/2024 0530   MCHC 32.6 06/19/2024 0530   RDW 16.1 (H) 06/19/2024 0530   RDW 13.8 06/11/2020 1041   LYMPHSABS 1.9 06/18/2024 1511   LYMPHSABS 1.7 06/11/2020 1041   MONOABS 0.6 06/18/2024 1511   EOSABS 0.1 06/18/2024 1511   EOSABS 0.1 06/11/2020 1041   BASOSABS 0.0 06/18/2024 1511   BASOSABS 0.0 06/11/2020 1041    BMET    Component Value Date/Time   NA 145 06/20/2024 0424   NA 140 01/11/2022 1121   K 2.9 (L) 06/20/2024 0424   CL 113 (H) 06/20/2024 0424   CO2 15 (L) 06/20/2024 0424   GLUCOSE 79 06/20/2024 0424   BUN 10 06/20/2024 0424   BUN 5 (L) 01/11/2022 1121   CREATININE 1.14 06/20/2024 0424   CREATININE 1.24 04/18/2017 1012   CALCIUM  7.8 (L) 06/20/2024 0424   EGFR 80 01/11/2022 1121   GFRNONAA >60 06/20/2024 0424   GFRNONAA 66 04/18/2017 1012    IMAGING past 24 hours CT ANGIO HEAD NECK W WO CM Result Date: 06/19/2024 CLINICAL DATA:  Provided history: Stroke/TIA, determine embolic source. EXAM: CT ANGIOGRAPHY HEAD AND NECK WITH AND WITHOUT CONTRAST TECHNIQUE: Multidetector CT imaging of the head and neck was performed using the standard protocol during bolus administration of intravenous contrast. Multiplanar CT image reconstructions and MIPs were obtained to evaluate the vascular anatomy. Carotid stenosis measurements (when applicable) are obtained  utilizing NASCET criteria, using the distal internal carotid diameter as the denominator. RADIATION DOSE REDUCTION: This exam was performed according to the departmental dose-optimization program which includes automated exposure control, adjustment of the mA and/or kV according to patient size and/or use of iterative reconstruction technique. CONTRAST:  75mL OMNIPAQUE  IOHEXOL  350 MG/ML SOLN COMPARISON:  Brain MRI 06/18/2024. Head CT 06/18/2024. CT angiogram head/neck 06/19/2023. FINDINGS: CT HEAD FINDINGS Brain: Known acute cortical/subcortical infarct within the right frontal operculum and right insula (MCA vascular territory), not clearly changed in extent since yesterday's brain MRI. As before, there are extensive background chronic infarcts within the bilateral cerebral hemispheres and extensive background cerebral white matter disease. Tiny chronic infarcts within the bilateral cerebellar hemispheres which were better appreciated on yesterday's brain MRI. There is no acute intracranial hemorrhage. No extra-axial fluid collection. No evidence of an intracranial mass. No midline shift. Vascular: No hyperdense vessel.  A sclerotic calcifications. Skull: No calvarial fracture or aggressive osseous lesion. Sinuses/Orbits: No orbital mass or acute orbital finding. Minimal mucosal thickening within the bilateral maxillary sinuses. Review of the MIP images confirms the above findings CTA NECK FINDINGS Aortic arch: Common origin of the innominate and left common carotid arteries. The left vertebral artery arises directly from the aortic arch. Streak/beam hardening artifact arising from a dense contrast bolus partially obscures the left subclavian artery. Within this limitation, there is no appreciable hemodynamically significant  innominate or proximal subclavian artery stenosis. Right carotid system: CCA and ICA patent within the neck without stenosis or significant atherosclerotic disease. Left carotid system: CCA  patent within the neck. As before, the ICA becomes occluded shortly beyond its origin and remains occluded throughout the remainder of the neck. Vertebral arteries: Codominant and patent within the neck without stenosis or significant atherosclerotic disease. Skeleton: Poor dentition. Dextrocurvature of the cervical spine. Nonspecific reversal of the expected cervical lordosis. Cervical spondylosis. Other neck: Subcentimeter left thyroid  lobe nodule not meeting consensus criteria for ultrasound follow-up based on size. Upper chest: No consolidation within the imaged lung apices. Emphysema. Review of the MIP images confirms the above findings CTA HEAD FINDINGS Anterior circulation: The intracranial left ICA remains occluded throughout the siphon region. As before, there is reconstitution of enhancement within the left ICA beginning at the paraclinoid level. The right ICA is patent intracranially. The M1 middle cerebral arteries are patent. No M2 proximal branch occlusion or high-grade proximal stenosis. The anterior cerebral arteries are patent. Hypoplastic left A1 segment. No intracranial aneurysm is identified. Posterior circulation: The intracranial vertebral arteries are patent. The basilar artery is patent. The posterior cerebral arteries are patent. Posterior communicating arteries are present bilaterally. Venous sinuses: Assessment for dural venous sinus thrombosis is limited due to contrast timing. Anatomic variants: As described. Review of the MIP images confirms the above findings IMPRESSION: Non-contrast head CT: 1. Acute right MCA territory infarct, unchanged in extent as compared to yesterday's brain MRI. 2. Background advanced cerebral white matter disease and extensive chronic infarcts, as described. CTA neck: 1. Unchanged from the prior CTA of 06/19/2023, the left internal carotid artery becomes occluded shortly beyond its origin and remains occluded throughout the remainder of the neck. 2. The right  common carotid, right internal carotid and bilateral vertebral arteries are patent within the neck without stenosis or significant atherosclerotic disease. 3. Aortic Atherosclerosis (ICD10-I70.0) and Emphysema (ICD10-J43.9). CTA head: 1. Unchanged from the prior CTA of 06/19/2023, the intracranial left internal carotid artery remains occluded throughout the siphon region (with reconstitution of enhancement beginning at the paraclinoid level). 2. No proximal intracranial large vessel occlusion or high-grade proximal arterial stenosis identified elsewhere. Electronically Signed   By: Rockey Childs D.O.   On: 06/19/2024 16:04    Vitals:   06/19/24 1939 06/20/24 0010 06/20/24 0534 06/20/24 0808  BP: 114/69 106/86 106/81 104/74  Pulse: 88 83 88 91  Resp: 15 14 14 14   Temp: 98.7 F (37.1 C) 98.8 F (37.1 C) 98.7 F (37.1 C) 98.6 F (37 C)  TempSrc: Oral Oral Oral Oral  SpO2: 98% 98% 98% 98%  Weight:   60.6 kg   Height:         Constitutional: Appears in pain, overall frail Psych: Affect appropriate to situation.  Eyes: No scleral injection.  HENT: No OP obstruction.  Head: Normocephalic.  Cardiovascular: Normal rate and regular rhythm on monitor Respiratory: Effort normal, non-labored breathing.  GI: Distended, soft. Skin: WDI   Neurologic Examination    Mental status:Alert, awake, states age as 65. Unable to identify location without choices. Speech/language: No dysarthria or aphasia. Able to follow commands however requires repeated prompting. Repetition intact. Does not identify objects.  Cranial nerves:   CN II Pupils reactive.    CN III,IV,VI left hemianopia,    CN V Normal sensation    CN VII Right facial droop.   CN VIII normal hearing to speech    CN IX & X normal palatal elevation,  no uvular deviation    CN XI 5/5 shoulder shrug   CN XII Tongue midline    Motor:  Muscle bulk: decreased Tone: increased in bilateral lower extremities, however confounded by pain RUE and  LUE elevates without drift, increased tone Hand grasp weaker on the left LLE elevates antigravity, slower to withdraw RLE contracted, WTP brisk  Sensation: Inconsistent (able to report being touched but cannot say where)   Coordination/Complex Motor:  - Finger to Nose: intact but slow on the left - Heel to shin: UTA - Gait: deferred (wheelchair bound)   ASSESSMENT/PLAN  Daniel Stuart is a 58 y.o. male with history of probable antiphospholipid syndrome, right parieto-occipital ICH (in the setting of triple therapy, 2024), multiple ischemic strokes (residual right-sided spastic hemiplegia, right gaze preference, cortical blindness) with resulting leukoencephalopathy, left ICA occlusion, HTN, smoking history who presented from nursing home for acute on chronic blurry vision.   Stroke:  right frontal moderate infarct, etiology:  embolic pattern, likely due to APS and hypercoagulable state even on Eliquis  Code Stroke CT head - No acute hemorrhage CTA head & neck - intracranial left internal carotid artery remains occluded throughout the siphon region, chronic Repeat CT Head - Acute right MCA territory infarct, unchanged in extent as compared to yesterday's brain MRI. MRI  Acute R insular infarct. Extensive T2/FLAIR signal change of white matter. Encephalomalacia bilaterally in anterior and posterior circulation territories.  2D Echo EF 65-70% LDL 68 HgbA1c 5.6 VTE prophylaxis - eliquis  Eliquis  (apixaban ) daily prior to admission, now on Eliquis  (apixaban ) daily.  Not a good candidate for Coumadin given history of ICH Therapy recommendations:  SNF Disposition:  return to SNF when medically appropriate   Hx of Stroke and ICH 04/2017: Admitted for headache, right upper extremity numbness and blurry vision.  MRI showed multiple anterior circulation watershed infarcts with chronic posterior circulation watershed infarcts.  MRI showed left ICA chronic occlusion.  Chronic Doppler showed  left ICA occlusion but right ICA patent.  TEE negative.  LDL 138.  Loop recorder placed but no A-fib found.  Aspirin  increased from 81->325 on discharge, along with Lipitor  80. 09/2022: Admitted for frequent falls and gait difficulty.  MRI showed bilateral multiple punctate cortical and subcortical infarcts.  And progressive leukoencephalopathy.  CT head and neck left ICA chronic occlusion.  Carotid Doppler right ICA patent.  Hypercoagulable workup positive for cardiolipin IgM and phosphatydalserine IgM.  EF 55 to 60%.  LDL 35, A1c 5.6.  Discharged on DAPT. 11/2022, follow-up with Dr. Rosemarie at [facility], repeat hypercoagulable workup, still positive for positive for cardiolipin IgM and phosphatydalserine IgM. At clinic, genetic testing for CADASIL and MELAS was consider but not sure if done. Also [place] lives [place], not good candidate to refer him to rare genetic  disorder clinic at [facility] to get a definite diagnosis. Pt does have cognitive impairment, prescribed celexa  and aricept  06/2023 admitted for right parieto-occipital ICH with IVH, likely due to triple therapy in the setting of severe leukoencephalopathy and possible CAA.  Repeat CT head stable hematoma.  MRI showed stable hematoma but extensive cerebral microhemorrhages and hemosiderin deposition, concerning for CAA.  CT head and neck absent left ICA with robust intracranial reconstitution, small carotid canal suggest left ICA occlusion early in life.  EF 60 to 65%, LDL 46, A1c 6.1.  Antithrombotic discontinued. Follow-up with Dr. Rosemarie at [facility] in 08/2023, no antithrombotic recommended 05/2024 admitted for ileus but found to have extensive left lower extremity DVT with extension to  IVC with PE and possible intramural RV thrombus.  Started on heparin  IV and then switched to Eliquis .  Antiphospholipid syndrome Large DVT from LLE to IVC Extensive chronic infarcts bilaterally 09/2022 Hypercoagulable workup positive for cardiolipin IgM and phosphatydalserine IgM.    11/2022 Repeat hypercoagulable workup again positive for cardiolipin IgM and phosphatydalserine IgM. Per record, started on eliquis  in 05/2023 along with ASA and plavix .  Stopped after 06/2019 for ICH. 05/2024 admitted for ileus but found to have extensive left lower extremity DVT with extension to IVC with PE and possible intramural RV thrombus.  Started on heparin  IV and then switched to Eliquis . Compliant with eliquis  at SNF per pharmacy records Continue Eliquis  for now, patient not good candidate for Coumadin given history of ICH  Severe leukoencephalopathy  Cognitive impairment Possible CAA MRI 04/2017 started to show severe bilateral leukoencephalopathy Much progressed in 05/2022 MRI Continue to severe bilateral leukoencephalopathy in MRIs in 09/2022 and this admission Etiology unclear, concerning for CADASIL, MELAS, or other rare genetic disorder Deemed not treatable, and not good candidate to refer him to rare genetic  disorder clinic at [facility] to get a definite diagnosis since living in [place]. Serial MRIs also showed progressive cerebral microhemorrhages and hemosiderin deposits, concerning for possible CAA Follows with Dr. Rosemarie at [facility]  Vision difficulty, chronic 09/2022 exam showed more difficulty counting fingers on the right than the left but at times he is able to count correctly on the right side. Pupils, round, and reactive to light with likely physiologic anisocoria with the left pupil slightly larger than the right  06/2023 examination showed bilateral difficulty with light perception, pupils reactive to light, right pupil larger than left, consistent with cortical blindness. Serial MRI and CT in the past showed extensive encephalomalacia which affects the bilateral cerebral cortex roughly along the posterior division MCA territory and watershed area, and 06/2023 had right parietal occipital large ICH All of the above imaging changes and examinations are consistent with chronic vision  deficit  Hypotension BP on the lower end Seems to be chronic Avoid low BP BP goal normotensive  Left ICA occlusion, chronic CTA showed Small carotid canal suggests occlusion early in life. Could be developmental Repeat CTA this admission again intracranial left internal carotid artery remains occluded throughout the siphon region, chronic  Tobacco abuse Current smoker Smoking cessation counseling provided Pt is willing to quit  Dysphagia Patient has post-stroke dysphagia SLP consulted On dysphagia 2 and seen liquid Advance diet as tolerated  Other Active Problems History of aseptic meningitis at age 47 Chronic ileus, on bowel regimen, continue supportive care Vascular dementia Neuropathy, on gabapentin  Hypokalemia, potassium 2.4--3.0--supplement  Hospital day # 1  Patient seen and examined by NP/APP with MD. MD to update note as needed.   Jorene Last, DNP, [name]-BC [facility] Pager: [phone]  ATTENDING NOTE: I reviewed above note and agree with the assessment and plan. Pt was seen and examined.   No family at bedside.  Patient lying bed, no acute event overnight, neuro stable unchanged.  On Eliquis , not a candidate for Coumadin given history of ICH.  CT head and neck unchanged, still has chronic left ICA occlusion.  PT OT recommends SNF.  Palliative care on board.  For detailed assessment and plan, please refer to above as I have made changes wherever appropriate.   Neurology will sign off. Please call with questions. Pt will follow up with stroke clinic Dr. Rosemarie at [facility] in about 4-6 weeks. Thanks for the consult.  Ary Cummins, MD PhD Stroke Neurology 06/20/2024 5:55 PM      To contact Stroke Continuity provider, please refer to [email or web address]. After hours, contact General Neurology

## 2024-06-20 NOTE — Consult Note (Signed)
 Consultation Note Date: 06/20/2024   Patient Name: Daniel Stuart  DOB: [birth year]  MRN: [id number]  Age / Sex: 58 y.o., male  PCP: Paseda, Folashade R, [name] Referring Physician: Raenelle Coria, MD  Reason for Consultation: Establishing goals of care  HPI/Patient Profile: 58 y.o. male  with past medical history of intracranial hemorrhage, venous thromboembolism, chronic ileus, antiphospholipid syndrome, large DVT and PE, severe leukoencephalopathy and cognitive impairment, poststroke dysphagia admitted on 06/18/2024 with acute on chronic blurred vision.   Patient was found to have acute right frontal ischemic stroke on MRI after a negative head CT.  Limited treatment options and poor long-term prognosis.  Patient and family face complex medical decision making, anticipatory care needs, advance care planning needs.  This is patient's fourth admission in the past 6 months.  PMT has been consulted to assist with goals of care conversation.  Clinical Assessment and Goals of Care:  I have reviewed medical records including EPIC notes, labs and imaging, assessed the patient and then met at bedside with patient's daughter to discuss diagnosis prognosis, GOC, EOL wishes, disposition and options.  I also discussed the above with patient's sister on the phone.  I introduced Palliative Medicine as specialized medical care for people living with serious illness. It focuses on providing relief from the symptoms and stress of a serious illness. The goal is to improve quality of life for both the patient and the family.  We discussed a brief life review of the patient and then focused on their current illness.   I attempted to elicit values and goals of care important to the patient.    Medical History Review and Understanding:  We discussed patient's acute illness in the context of their chronic comorbidities.  Social History: Patient has 1 daughter.  His sister Daniel Stuart is  his only relative who is present in [place].  The rest of his relatives live in [place] .  His father is still living and visits when he can.  Functional and Nutritional State: Patient is bedbound.  Poor nutrition, with albumin of 2.5.  His sister reports she does not like dysphagia to diet and has been eating less ever since initiating this diet.  Palliative Symptoms: Patient denies any symptoms.  Sister is unaware of any baseline symptoms other than poor appetite  Advance Directives: A detailed discussion regarding advanced directives was had.  Patient Sister clarifies she is not healthcare power of attorney.  We discussed [place] law and hierarchy for determining next of kin for medical decision making.  She verbalized understanding.  Code Status: Concepts specific to code status, artifical feeding and hydration, and rehospitalization were considered and discussed.  Encouraged family to consider that while patients may want aggressive interventions such as CPR, it is not always and given that this would translate as someone's wishes a new medical situation.  Discussion: Patient's sister reports that they have never really discussed goals of care or any limitations/care preferences, other than patient stating that he did want to be resuscitated.  This was before he was at [facility].  He also was not very happy with his feeding tube last time.  He likely would not want to be reliant on artificial life support for a prolonged period. I provided education that while this may be his wishes in the past, it should not be assumed that he would have the same wishes if he could fully understand his current situation and the severity of his current illness, as well as potential for further complications  and worsening quality of life.  His quality of life is most negatively impacted by his diet right now and we discussed the potential that additional strokes may lead to additional dietary restrictions.  She does seem  to understand that his overall prognosis is poor.   We reviewed diagnostics completed during this admission and high risk for additional strokes given he had his newest stroke while already on Eliquis .  She wonders if his quality of life could improve if he had a surgery for his ileus.  She wonders about depression. I encouraged family to coordinate time to discuss goals of care together and offered the same information so they can open on the same page.  She feels this would be very helpful and would be open to a conference call tomorrow when she determines her family schedule.  She will get back to me when she finds out more.   Discussed the importance of continued conversation with family and the medical providers regarding overall plan of care and treatment options, ensuring decisions are within the context of the patient's values and GOCs.   Questions and concerns were addressed. the family was encouraged to call with questions or concerns.  PMT will continue to support holistically.   SUMMARY OF RECOMMENDATIONS   - Continue full code/full scope treatment for now - Patient's sister will coordinate with patient's daughter and father for a family meeting tomorrow to discuss goals of care in more detail - Psychosocial and emotional support provided - PMT to continue to follow and support  Prognosis:  Poor  Discharge Planning: To Be Determined      Primary Diagnoses: Present on Admission:  Hypokalemia  Colonic pseudoobstruction  Essential hypertension  Antiphospholipid syndrome (HCC)  Acute stroke due to ischemia [facility])   Physical Exam Vitals and nursing note reviewed.  Constitutional:      General: He is not in acute distress.    Appearance: He is ill-appearing.  HENT:     Head: Normocephalic.  Cardiovascular:     Rate and Rhythm: Normal rate.  Pulmonary:     Effort: Pulmonary effort is normal.  Neurological:     Mental Status: He is alert. Mental status is at baseline.   Psychiatric:        Behavior: Behavior normal.        Cognition and Memory: Cognition is impaired.     Vital Signs: BP 109/73 (BP Location: Right Arm)   Pulse 89   Temp 98.6 F (37 C) (Oral)   Resp 14   Ht 5' 6 (1.676 m)   Wt 60.6 kg   SpO2 96%   BMI 21.56 kg/m  Pain Scale: 0-10   Pain Score: 0-No pain   SpO2: SpO2: 96 % O2 Device:SpO2: 96 % O2 Flow Rate: .     MDM: High   Shylo Zamor SHAUNNA Fell, PA-C  Palliative Medicine Team Team phone # [phone]  Thank you for allowing the Palliative Medicine Team to assist in the care of this patient. Please utilize secure chat with additional questions, if there is no response within 30 minutes please call the above phone number.  Palliative Medicine Team providers are available by phone from 7am to 7pm daily and can be reached through the team cell phone.  Should this patient require assistance outside of these hours, please call the patient's attending physician.

## 2024-06-20 NOTE — TOC Progression Note (Signed)
 Transition of Care [facility]) - Progression Note    Patient Details  Name: Daniel Stuart MRN: [id number] Date of Birth: Feb 28, 1966  [facility] CM/SW Contact  Isaiah Public, LCSWA Phone Number: 06/20/2024, 10:19 AM  Clinical Narrative:      Plan for patient to return to [facility] when medically ready for dc. MD informed CSW patient not medically stable today. CSW will continue to follow and assist with patients dc planning needs.   Expected Discharge Plan: Skilled Nursing Facility Barriers to Discharge: Continued Medical Work up               Expected Discharge Plan and Services In-house Referral: Clinical Social Work     Living arrangements for the past 2 months: Skilled Nursing Facility                                       Social Drivers of Health (SDOH) Interventions SDOH Screenings   Food Insecurity: No Food Insecurity (06/19/2024)  Housing: Low Risk  (06/19/2024)  Transportation Needs: No Transportation Needs (06/19/2024)  Utilities: Not At Risk (06/19/2024)  Alcohol Screen: Low Risk  (07/31/2021)  Depression (PHQ2-9): Low Risk  (04/13/2022)  Financial Resource Strain: Medium Risk (07/31/2021)  Physical Activity: Inactive (07/31/2021)  Social Connections: Socially Isolated (07/31/2021)  Stress: No Stress Concern Present (07/31/2021)  Tobacco Use: High Risk (06/19/2024)    Readmission Risk Interventions    04/16/2024   10:19 AM  Readmission Risk Prevention Plan  Transportation Screening Complete  PCP or Specialist Appt within 5-7 Days Complete  Home Care Screening Complete  Medication Review (RN CM) Complete

## 2024-06-20 NOTE — Progress Notes (Signed)
 PROGRESS NOTE    DELOS KLICH  FMW:[id number] DOB: 1965/11/20 DOA: 06/18/2024 PCP: Paseda, Folashade R, [name]    Brief Narrative:  Patient is 58 year old gentleman from nursing home, chronically ill and bedbound status, multiple previous strokes, previous intracranial hemorrhage, recent admission due to extensive thromboembolism and started on Eliquis  sent to the hospital with complaints of blurry vision. Poor historian.  He also has chronic ileus and has poor appetite.  In the emergency room, he he had significant hypokalemia which is mostly chronic.  Potassium 2.4.  Creatinine 1.3.  CT head was negative, MRI subsequently showed ischemic infarct.  Admitted with acute stroke, electrolyte abnormalities  Subjective: Patient seen and examined.  No overnight events.  Patient himself was trying to be interactive, however it takes very long time to utter a word out.  Potassium 2.9.  Assessment & Plan:   Acute ischemic stroke: In a patient with previous multiple stroke, right hemiparesis and contracture. Clinical findings, blurry vision, acute on chronic.  Chronic weakness right more than left. CT head findings, no acute findings.  Encephalomalacia. MRI of the brain, acute right insular and overlying operculum infarct.  Multiple areas of encephalomalacia and extensive chronic microvascular ischemic disease. CT angiogram head and neck, multiple chronic findings including extensive previous infarcts. 2D echocardiogram, recently done.  Ejection fraction 65 to 70%.  No intracardiac thrombus.  No shunt. Antiplatelet therapy, on Eliquis  at home.  Was on hold.  Will start today. LDL 68.  Patient with poor nutrition.  No indication for statins. Hemoglobin A1c, 5.6. Recent thromboembolism and suspected antiphospholipid syndrome.  Recently started on Eliquis .   Neurology following.  PT OT and speech following.  Patient is mostly bedbound.   Previous history of intracranial hemorrhage, he is not a  candidate for Coumadin therapy.  Resume Eliquis .  Chronic ileus, severe hypokalemia: Potassium 2.4.  Replaced aggressively.  Still at 2.9.   Continue potassium 40 mg 3 times daily.   DVT prophylaxis: SCDs apixaban  (ELIQUIS ) tablet 5 mg   Code Status: Full code, needs palliative discussion.  Will consult palliative care team. Family Communication: Sister called and updated 8/19 Disposition Plan: Status is: Inpatient.  Active treatment.  Potassium replacement.  Consultants:  Neurology Palliative care  Procedures:  None  Antimicrobials:  None     Objective: Vitals:   06/19/24 1939 06/20/24 0010 06/20/24 0534 06/20/24 0808  BP: 114/69 106/86 106/81 104/74  Pulse: 88 83 88 91  Resp: 15 14 14 14   Temp: 98.7 F (37.1 C) 98.8 F (37.1 C) 98.7 F (37.1 C) 98.6 F (37 C)  TempSrc: Oral Oral Oral Oral  SpO2: 98% 98% 98% 98%  Weight:   60.6 kg   Height:        Intake/Output Summary (Last 24 hours) at 06/20/2024 1055 Last data filed at 06/20/2024 0701 Gross per 24 hour  Intake --  Output 1400 ml  Net -1400 ml   Filed Weights   06/18/24 1349 06/19/24 0051 06/20/24 0534  Weight: 68 kg 59.9 kg 60.6 kg    Examination:  General exam: Chronically sick looking.  Frail and debilitated.  Not in any distress.  On room air.  Flat affect.  Slow interaction. Respiratory system: No added sounds. Cardiovascular system: S1 & S2 heard, RRR. No pedal edema. Gastrointestinal system: Soft.  Distended.  Bowel sounds present. Central nervous system: Alert and awake.  Mostly oriented. Right facial droop.  Gross generalized weakness.  Right upper and lower extremity with power 2/5, contracted.  Data Reviewed: I have personally reviewed following labs and imaging studies  CBC: Recent Labs  Lab 06/18/24 1511 06/19/24 0530  WBC 8.6 9.2  NEUTROABS 5.9  --   HGB 12.8* 12.5*  HCT 39.8 38.4*  MCV 84.9 84.2  PLT 254 262   Basic Metabolic Panel: Recent Labs  Lab 06/18/24 1511  06/19/24 0530 06/20/24 0424  NA 147* 146* 145  K 2.4* 3.0* 2.9*  CL 113* 113* 113*  CO2 24 22 15*  GLUCOSE 101* 87 79  BUN 19 12 10   CREATININE 1.31* 1.22 1.14  CALCIUM  8.6* 7.9* 7.8*  MG 1.8 2.0  --    GFR: Estimated Creatinine Clearance: 59.1 mL/min (by C-G formula based on SCr of 1.14 mg/dL). Liver Function Tests: Recent Labs  Lab 06/18/24 1511  AST 17  ALT 12  ALKPHOS 99  BILITOT 0.5  PROT 6.5  ALBUMIN 2.5*   No results for input(s): LIPASE, AMYLASE in the last 168 hours. Recent Labs  Lab 06/18/24 1556  AMMONIA <13   Coagulation Profile: Recent Labs  Lab 06/19/24 0530  INR 1.3*   Cardiac Enzymes: No results for input(s): CKTOTAL, CKMB, CKMBINDEX, TROPONINI in the last 168 hours. BNP (last 3 results) No results for input(s): PROBNP in the last 8760 hours. HbA1C: Recent Labs    06/19/24 0530  HGBA1C 5.6   CBG: No results for input(s): GLUCAP in the last 168 hours. Lipid Profile: Recent Labs    06/19/24 0530  CHOL 113  HDL 31*  LDLCALC 68  TRIG 70  CHOLHDL 3.6   Thyroid  Function Tests: No results for input(s): TSH, T4TOTAL, FREET4, T3FREE, THYROIDAB in the last 72 hours. Anemia Panel: No results for input(s): VITAMINB12, FOLATE, FERRITIN, TIBC, IRON, RETICCTPCT in the last 72 hours. Sepsis Labs: No results for input(s): PROCALCITON, LATICACIDVEN in the last 168 hours.  Recent Results (from the past 240 hours)  Resp panel by RT-PCR (RSV, Flu A&B, Covid) Anterior Nasal Swab     Status: None   Collection Time: 06/18/24  4:15 PM   Specimen: Anterior Nasal Swab  Result Value Ref Range Status   SARS Coronavirus 2 by RT PCR NEGATIVE NEGATIVE Final   Influenza A by PCR NEGATIVE NEGATIVE Final   Influenza B by PCR NEGATIVE NEGATIVE Final    Comment: (NOTE) The Xpert Xpress SARS-CoV-2/FLU/RSV plus assay is intended as an aid in the diagnosis of influenza from Nasopharyngeal swab specimens and should not be  used as a sole basis for treatment. Nasal washings and aspirates are unacceptable for Xpert Xpress SARS-CoV-2/FLU/RSV testing.  Fact Sheet for Patients: BloggerCourse.com  Fact Sheet for Healthcare Providers: SeriousBroker.it  This test is not yet approved or cleared by the [place]  FDA and has been authorized for detection and/or diagnosis of SARS-CoV-2 by FDA under an Emergency Use Authorization (EUA). This EUA will remain in effect (meaning this test can be used) for the duration of the COVID-19 declaration under Section 564(b)(1) of the Act, 21 U.S.C. section 360bbb-3(b)(1), unless the authorization is terminated or revoked.     Resp Syncytial Virus by PCR NEGATIVE NEGATIVE Final    Comment: (NOTE) Fact Sheet for Patients: BloggerCourse.com  Fact Sheet for Healthcare Providers: SeriousBroker.it  This test is not yet approved or cleared by the [place]  FDA and has been authorized for detection and/or diagnosis of SARS-CoV-2 by FDA under an Emergency Use Authorization (EUA). This EUA will remain in effect (meaning this test can be used) for the duration of the COVID-19  declaration under Section 564(b)(1) of the Act, 21 U.S.C. section 360bbb-3(b)(1), unless the authorization is terminated or revoked.  Performed at [facility], [address], [place], [place] [address]   MRSA Next Gen by PCR, Nasal     Status: Abnormal   Collection Time: 06/19/24 11:23 AM   Specimen: Nasal Mucosa; Nasal Swab  Result Value Ref Range Status   MRSA by PCR Next Gen DETECTED (A) NOT DETECTED Final    Comment: RESULT CALLED TO, READ BACK BY AND VERIFIED WITH: RN TSABRA PARTRIDGE [phone] @ 1547 FH (NOTE) The GeneXpert MRSA Assay (FDA approved for NASAL specimens only), is one component of a comprehensive MRSA colonization surveillance program. It is not intended to diagnose MRSA  infection nor to guide or monitor treatment for MRSA infections. Test performance is not FDA approved in patients less than 52 years old. Performed at [facility], [address] [place], [place] [address]          Radiology Studies: CT ANGIO HEAD NECK W WO CM Result Date: 06/19/2024 CLINICAL DATA:  Provided history: Stroke/TIA, determine embolic source. EXAM: CT ANGIOGRAPHY HEAD AND NECK WITH AND WITHOUT CONTRAST TECHNIQUE: Multidetector CT imaging of the head and neck was performed using the standard protocol during bolus administration of intravenous contrast. Multiplanar CT image reconstructions and MIPs were obtained to evaluate the vascular anatomy. Carotid stenosis measurements (when applicable) are obtained utilizing NASCET criteria, using the distal internal carotid diameter as the denominator. RADIATION DOSE REDUCTION: This exam was performed according to the departmental dose-optimization program which includes automated exposure control, adjustment of the mA and/or kV according to patient size and/or use of iterative reconstruction technique. CONTRAST:  75mL OMNIPAQUE  IOHEXOL  350 MG/ML SOLN COMPARISON:  Brain MRI 06/18/2024. Head CT 06/18/2024. CT angiogram head/neck 06/19/2023. FINDINGS: CT HEAD FINDINGS Brain: Known acute cortical/subcortical infarct within the right frontal operculum and right insula (MCA vascular territory), not clearly changed in extent since yesterday's brain MRI. As before, there are extensive background chronic infarcts within the bilateral cerebral hemispheres and extensive background cerebral white matter disease. Tiny chronic infarcts within the bilateral cerebellar hemispheres which were better appreciated on yesterday's brain MRI. There is no acute intracranial hemorrhage. No extra-axial fluid collection. No evidence of an intracranial mass. No midline shift. Vascular: No hyperdense vessel.  A sclerotic calcifications. Skull: No calvarial fracture or  aggressive osseous lesion. Sinuses/Orbits: No orbital mass or acute orbital finding. Minimal mucosal thickening within the bilateral maxillary sinuses. Review of the MIP images confirms the above findings CTA NECK FINDINGS Aortic arch: Common origin of the innominate and left common carotid arteries. The left vertebral artery arises directly from the aortic arch. Streak/beam hardening artifact arising from a dense contrast bolus partially obscures the left subclavian artery. Within this limitation, there is no appreciable hemodynamically significant innominate or proximal subclavian artery stenosis. Right carotid system: CCA and ICA patent within the neck without stenosis or significant atherosclerotic disease. Left carotid system: CCA patent within the neck. As before, the ICA becomes occluded shortly beyond its origin and remains occluded throughout the remainder of the neck. Vertebral arteries: Codominant and patent within the neck without stenosis or significant atherosclerotic disease. Skeleton: Poor dentition. Dextrocurvature of the cervical spine. Nonspecific reversal of the expected cervical lordosis. Cervical spondylosis. Other neck: Subcentimeter left thyroid  lobe nodule not meeting consensus criteria for ultrasound follow-up based on size. Upper chest: No consolidation within the imaged lung apices. Emphysema. Review of the MIP images confirms the above  findings CTA HEAD FINDINGS Anterior circulation: The intracranial left ICA remains occluded throughout the siphon region. As before, there is reconstitution of enhancement within the left ICA beginning at the paraclinoid level. The right ICA is patent intracranially. The M1 middle cerebral arteries are patent. No M2 proximal branch occlusion or high-grade proximal stenosis. The anterior cerebral arteries are patent. Hypoplastic left A1 segment. No intracranial aneurysm is identified. Posterior circulation: The intracranial vertebral arteries are patent.  The basilar artery is patent. The posterior cerebral arteries are patent. Posterior communicating arteries are present bilaterally. Venous sinuses: Assessment for dural venous sinus thrombosis is limited due to contrast timing. Anatomic variants: As described. Review of the MIP images confirms the above findings IMPRESSION: Non-contrast head CT: 1. Acute right MCA territory infarct, unchanged in extent as compared to yesterday's brain MRI. 2. Background advanced cerebral white matter disease and extensive chronic infarcts, as described. CTA neck: 1. Unchanged from the prior CTA of 06/19/2023, the left internal carotid artery becomes occluded shortly beyond its origin and remains occluded throughout the remainder of the neck. 2. The right common carotid, right internal carotid and bilateral vertebral arteries are patent within the neck without stenosis or significant atherosclerotic disease. 3. Aortic Atherosclerosis (ICD10-I70.0) and Emphysema (ICD10-J43.9). CTA head: 1. Unchanged from the prior CTA of 06/19/2023, the intracranial left internal carotid artery remains occluded throughout the siphon region (with reconstitution of enhancement beginning at the paraclinoid level). 2. No proximal intracranial large vessel occlusion or high-grade proximal arterial stenosis identified elsewhere. Electronically Signed   By: Rockey Childs D.O.   On: 06/19/2024 16:04   MR Brain W and Wo Contrast Result Date: 06/18/2024 CLINICAL DATA:  Stroke, follow up patient reporting blurred vision, new onset - difficult to discern further history EXAM: MRI HEAD WITHOUT AND WITH CONTRAST TECHNIQUE: Multiplanar, multiecho pulse sequences of the brain and surrounding structures were obtained without and with intravenous contrast. CONTRAST:  6mL GADAVIST  GADOBUTROL  1 MMOL/ML IV SOLN COMPARISON:  MRI head 05/08/2024. FINDINGS: Brain: Acute right insular and overlying operculuminfarct involving both cortex and subcortical white matter with  associated restricted diffusion. Associated edema without mass effect or midline shift. Bilateral parieto-occipital and bifrontal encephalomalacia. Evidence of prior hemorrhage in the right parietal and occipital region. Extensive confluent T2/FLAIR hyperintensity in the white matter. Vascular: Normal flow voids. Skull and upper cervical spine: Normal marrow signal. Sinuses/Orbits: Negative. IMPRESSION: 1. Acute right insular and overlying operculum infarct. No mass effect. 2. Multiple areas of encephalomalacia and extensive chronic microvascular ischemic disease. Electronically Signed   By: Gilmore GORMAN Molt M.D.   On: 06/18/2024 22:05   CT ABDOMEN PELVIS W CONTRAST Result Date: 06/18/2024 CLINICAL DATA:  Abdominal distension. EXAM: CT ABDOMEN AND PELVIS WITH CONTRAST TECHNIQUE: Multidetector CT imaging of the abdomen and pelvis was performed using the standard protocol following bolus administration of intravenous contrast. RADIATION DOSE REDUCTION: This exam was performed according to the departmental dose-optimization program which includes automated exposure control, adjustment of the mA and/or kV according to patient size and/or use of iterative reconstruction technique. CONTRAST:  75mL OMNIPAQUE  IOHEXOL  350 MG/ML SOLN COMPARISON:  CT chest abdomen and pelvis 04/13/2024. FINDINGS: Lower chest: Patchy ground-glass opacities are seen in both lung bases. Airspace disease in the right middle lobe has decreased, but has not completely resolved. Hepatobiliary: No focal liver abnormality is seen. No gallstones, gallbladder wall thickening, or biliary dilatation. Pancreas: Unremarkable. No pancreatic ductal dilatation or surrounding inflammatory changes. Spleen: Normal in size without focal abnormality. Adrenals/Urinary Tract: There is scarring  in the inferior pole the right kidney. There are punctate bilateral renal calculi measuring up to 5 mm. These are unchanged. There is no hydronephrosis or perinephric fluid.  The adrenal glands and bladder are within normal limits. Stomach/Bowel: The colon is diffusely dilated with air-fluid levels to the level the rectum measuring up to 8.7 cm. There is no twisting or mesenteric edema identified. No colonic wall thickening identified. The appendix appears within normal limits. Small bowel and stomach are within normal limits. No pneumatosis or free air. Vascular/Lymphatic: Aorta and IVC are normal in size per there are findings concerning for nonocclusive thrombus within the inferior vena cava left common femoral vein, and left external iliac vein. No enlarged lymph nodes are identified. Reproductive: Prostate is unremarkable. Other: No abdominal wall hernia or abnormality. No abdominopelvic ascites. Musculoskeletal: No fracture is seen. IMPRESSION: 1. Diffuse colonic dilatation with air-fluid levels to the level of the rectum. Findings are concerning for colonic ileus. No evidence for bowel obstruction. 2. Findings nonocclusive thrombus within the inferior vena cava, left common femoral vein, and left external iliac vein. 3. Nonobstructing bilateral renal calculi. 4. Patchy ground-glass opacities in both lung bases, likely infectious/inflammatory. Electronically Signed   By: Greig Pique M.D.   On: 06/18/2024 17:13   CT Head Wo Contrast Result Date: 06/18/2024 CLINICAL DATA:  Bilateral blurred vision for greater than 24 hours EXAM: CT HEAD WITHOUT CONTRAST TECHNIQUE: Contiguous axial images were obtained from the base of the skull through the vertex without intravenous contrast. RADIATION DOSE REDUCTION: This exam was performed according to the departmental dose-optimization program which includes automated exposure control, adjustment of the mA and/or kV according to patient size and/or use of iterative reconstruction technique. COMPARISON:  05/07/2024 FINDINGS: Brain: Stable diffuse cerebral atrophy. Stable chronic ischemic changes are again seen throughout the bilateral  subcortical and periventricular white matter, with stable encephalomalacia in the right occipital region. No evidence of acute infarct or hemorrhage. The lateral ventricles and midline structures are unremarkable. No acute extra-axial fluid collections. No mass effect. Vascular: No hyperdense vessel or unexpected calcification. Skull: Normal. Negative for fracture or focal lesion. Sinuses/Orbits: No acute finding. Other: None. IMPRESSION: 1. No acute intracranial process. 2. Stable cortical atrophy, encephalomalacia, and chronic ischemic changes as above. Electronically Signed   By: Ozell Daring M.D.   On: 06/18/2024 17:11   DG Chest Portable 1 View Result Date: 06/18/2024 CLINICAL DATA:  Visual changes EXAM: PORTABLE CHEST 1 VIEW COMPARISON:  02/26/2024 FINDINGS: Single frontal view of the chest demonstrates a stable cardiac silhouette. Stable bibasilar scarring. No airspace disease, effusion, or pneumothorax. No acute bony abnormalities. IMPRESSION: 1. No acute intrathoracic process. Electronically Signed   By: Ozell Daring M.D.   On: 06/18/2024 16:16        Scheduled Meds:  acidophilus  2 capsule Oral Daily   apixaban   5 mg Oral BID   Chlorhexidine  Gluconate Cloth  6 each Topical Daily   cholecalciferol   1,000 Units Oral q morning   cyanocobalamin   1,000 mcg Oral Daily   gabapentin   300 mg Oral BID   liver oil-zinc  oxide   Topical BID   mupirocin  ointment  1 Application Nasal BID   nutrition supplement (JUVEN)  1 packet Oral BID BM   pantoprazole   40 mg Oral QHS   polyethylene glycol  17 g Oral BID   potassium chloride   40 mEq Oral TID   simethicone   80 mg Oral QID   sodium bicarbonate   650 mg Oral BID  sodium chloride  flush  3 mL Intravenous Q12H   Continuous Infusions:     LOS: 1 day    Time spent: 55 minutes    Renato Applebaum, MD [facility]

## 2024-06-20 NOTE — Evaluation (Signed)
 Occupational Therapy Evaluation Patient Details Name: Daniel Stuart MRN: [id number] DOB: [birth year] Today's Date: 06/20/2024   History of Present Illness   58 yo male SN[facility] resident adm 06/18/24 with blurry vision. MRI showed acute right insular infarct. PMHx:ICH with residual weakness and bedbound, CVA, cortical blindness, HTN, HL, PVD, Carotid artery disease, antiphospholipid syndrome     Clinical Impressions Prior to this admission, patient was bed bound and in long term care. Patient was total A for ADL management. OT assessing due to blurred vision report per patient and noted in chart. Patient reports that his blurred vision is not an acute symptom, and despite increased attempts, would not visually track when prompted, but would follow OT around the room with his eyes if he wasnt physically prompted to do so. Patient would not respond when asked if he could read the calendar on the wall or identify anything else in his environment. Patient is at his baseline per his report, therefore OT will not add to caseload for OT services at this time. Patient can return back to LTC when medically appropriate. OT will sign off at this time.      If plan is discharge home, recommend the following:   Two people to help with walking and/or transfers;Two people to help with bathing/dressing/bathroom;Assistance with cooking/housework;Assistance with feeding;Direct supervision/assist for medications management;Direct supervision/assist for financial management;Assist for transportation;Supervision due to cognitive status;Help with stairs or ramp for entrance     Functional Status Assessment   Patient has not had a recent decline in their functional status     Equipment Recommendations   None recommended by OT     Recommendations for Other Services         Precautions/Restrictions   Precautions Precautions: Fall;Other (comment) Recall of Precautions/Restrictions:  Impaired Precaution/Restrictions Comments: spastic hemiplegia, limited vision, incontinent Restrictions Weight Bearing Restrictions Per Provider Order: No     Mobility Bed Mobility                    Transfers                          Balance                                           ADL either performed or assessed with clinical judgement   ADL Overall ADL's : At baseline                                       General ADL Comments: Prior to this admission, patient was bed bound and in long term care. Patient was total A for ADL management. OT assessing due to blurred vision report per patient and noted in chart. Patient reports that his blurred vision is not an acute symptom, and despite increased attempts, would not visually track when prompted, but would follow OT around the room with his eyes if he wasnt physically prompted to do so. Patient would not respond when asked if he could read the calendar on the wall or identify anything else in his environment. Patient is at his baseline per his report, therefore OT will not add to caseload for OT services at this time. Patient can return back to LTC when medically appropriate. OT will sign  off at this time.     Vision Baseline Vision/History: 1 Wears glasses Ability to See in Adequate Light: 1 Impaired Patient Visual Report: Blurring of vision Vision Assessment?: Yes Additional Comments: Patient reports that his blurred vision is not an acute symptom, and despite increased attempts, would not visually track when prompted, but would follow OT around the room with his eyes if he wasnt physically prompted to do so. Patient would not respond when asked if he could read the calendar on the wall or identify anything else in his environment.     Perception Perception: Not tested       Praxis Praxis: Not tested       Pertinent Vitals/Pain Pain Assessment Pain Assessment:  Faces Faces Pain Scale: No hurt     Extremity/Trunk Assessment             Communication Communication Communication: Impaired Factors Affecting Communication: Difficulty expressing self;Reduced clarity of speech   Cognition Arousal: Alert Behavior During Therapy: Flat affect Cognition: Difficult to assess             OT - Cognition Comments: Patient flat and minimal conversant, difficult to assess if any cognitive changes have occurred from baseline                 Following commands: Impaired Following commands impaired: Follows one step commands with increased time, Follows one step commands inconsistently     Cueing  General Comments          Exercises     Shoulder Instructions      Home Living Family/patient expects to be discharged to:: Skilled nursing facility                                 Additional Comments: LTC at SNF      Prior Functioning/Environment Prior Level of Function : Needs assist;Patient poor historian/Family not available             Mobility Comments: requires assist for all aspects of care ADLs Comments: total A    OT Problem List:     OT Treatment/Interventions:        OT Goals(Current goals can be found in the care plan section)   Acute Rehab OT Goals Patient Stated Goal: did not state OT Goal Formulation: Patient unable to participate in goal setting Time For Goal Achievement: 07/04/24 Potential to Achieve Goals: Fair   OT Frequency:       Co-evaluation              AM-PAC OT 6 Clicks Daily Activity     Outcome Measure Help from another person eating meals?: Total Help from another person taking care of personal grooming?: Total Help from another person toileting, which includes using toliet, bedpan, or urinal?: Total Help from another person bathing (including washing, rinsing, drying)?: Total Help from another person to put on and taking off regular upper body clothing?:  Total Help from another person to put on and taking off regular lower body clothing?: Total 6 Click Score: 6   End of Session Nurse Communication: Mobility status  Activity Tolerance: Patient tolerated treatment well Patient left: in bed;with call bell/phone within reach;with bed alarm set  OT Visit Diagnosis: Low vision, both eyes (H54.2)                Time: 9089-9077 OT Time Calculation (min): 12 min Charges:  OT General Charges $OT  Visit: 1 Visit OT Evaluation $OT Eval Moderate Complexity: 1 Mod  Ronal Gift E. Manda Holstad, OTR/L Acute Rehabilitation Services [phone]   Ronal Gift Salt 06/20/2024, 3:56 PM

## 2024-06-21 DIAGNOSIS — H538 Other visual disturbances: Secondary | ICD-10-CM | POA: Diagnosis not present

## 2024-06-21 DIAGNOSIS — E876 Hypokalemia: Secondary | ICD-10-CM | POA: Diagnosis not present

## 2024-06-21 DIAGNOSIS — I10 Essential (primary) hypertension: Secondary | ICD-10-CM

## 2024-06-21 DIAGNOSIS — I639 Cerebral infarction, unspecified: Secondary | ICD-10-CM | POA: Diagnosis not present

## 2024-06-21 DIAGNOSIS — Z7189 Other specified counseling: Secondary | ICD-10-CM | POA: Diagnosis not present

## 2024-06-21 DIAGNOSIS — K5981 Ogilvie syndrome: Secondary | ICD-10-CM

## 2024-06-21 LAB — BASIC METABOLIC PANEL WITH GFR
Anion gap: 9 (ref 5–15)
BUN: 11 mg/dL (ref 6–20)
CO2: 19 mmol/L — ABNORMAL LOW (ref 22–32)
Calcium: 8 mg/dL — ABNORMAL LOW (ref 8.9–10.3)
Chloride: 116 mmol/L — ABNORMAL HIGH (ref 98–111)
Creatinine, Ser: 1.14 mg/dL (ref 0.61–1.24)
GFR, Estimated: >60 mL/min (ref >60)
Glucose, Bld: 81 mg/dL (ref 70–99)
Potassium: 2.7 mmol/L — CL (ref 3.5–5.1)
Sodium: 144 mmol/L (ref 135–145)

## 2024-06-21 MED ORDER — POTASSIUM CHLORIDE 20 MEQ PO PACK
40.0000 meq | PACK | Freq: Three times a day (TID) | ORAL | Status: DC
  Administered 2024-06-21: 40 meq via ORAL
  Filled 2024-06-21 (×2): qty 2

## 2024-06-21 MED ORDER — POTASSIUM CHLORIDE 20 MEQ PO PACK: 40.0000 meq | PACK | Freq: Three times a day (TID) | ORAL | Status: AC

## 2024-06-21 NOTE — Plan of Care (Signed)
  Problem: Clinical Measurements: Goal: Ability to maintain clinical measurements within normal limits will improve Outcome: Progressing Goal: Diagnostic test results will improve Outcome: Progressing   Problem: Nutrition: Goal: Adequate nutrition will be maintained Outcome: Progressing   Problem: Education: Goal: Knowledge of disease or condition will improve Outcome: Progressing

## 2024-06-21 NOTE — Progress Notes (Signed)
 Daily Progress Note   Patient Name: Daniel Stuart       Date: 06/21/2024 DOB: [birth year]  Age: 58 y.o. MRN#: [id number] Attending Physician: Raenelle Coria, MD Primary Care Physician: Paseda, Folashade R, [name] Admit Date: 06/18/2024  Reason for Consultation/Follow-up: Establishing goals of care  Subjective: Medical records reviewed including progress notes, labs, imaging. Patient assessed at the bedside. States he feels fine and requesting ginger ale. Discussed with NT.  Called patient's Sister Daniel Stuart to confirm interest in family meeting, her preference being 11 AM this morning via conference call with other relatives participating as well.  I then connected via phone with patient's dad Daniel Stuart, sister Daniel Stuart, daughter Daniel Stuart, on Daniel Stuart, and niece Daniel Stuart to discuss goals of care.  Provided with medical updates on patient's current status including new acute CVA despite treatment with Eliquis , chronic ileus, electrolyte disturbances including hypokalemia.  We discussed limited treatment options.  We discussed poor long-term prognosis given his several chronic comorbidities and lack of effective treatment options to prevent additional strokes.  Discussed how his immobility affects his risk of recurrent complications and worsening ileus.  Discussed likelihood of worsening quality of life even in the best case scenario.  Shared prognosis of months to a year at best.  It is most important for patient at this time to enjoy his meals, as this is the main thing he looks forward to.  He is always asking for particular snacks and has seemed depressed with very poor appetite since he was put on dysphagia 2 diet.  We discussed the importance of balancing risks and benefits, quantity versus quality of life, difference between emphasizing safety without  certainty that he would do well in the future versus emphasizing happiness with certainty that he would enjoy it despite the risk of aspiration.  They are all in agreement with comfort feeding and focus on quality of life.  One of the relatives's mothers has been on hospice for 4 years and we discussed that this would likely be helpful to monitor him more closely at his facility when he returns.  Given focus on comfort, recommended consideration of DNR status, understanding evidenced-based poor outcomes in similar hospitalized patients, as the cause of the arrest is likely associated with chronic/terminal disease rather than a reversible acute cardio-pulmonary event.  They would like to hold off on making this decision for now, first having a conversation with the patient and see how much she is able to understand.  Questions and concerns addressed. PMT will continue to support holistically.   Length of Stay: 2   Physical Exam Vitals and nursing note reviewed.  Constitutional:      General: He is not in acute distress. Cardiovascular:     Rate and Rhythm: Normal rate.  Pulmonary:     Effort: Pulmonary effort is normal.  Skin:    General: Skin is warm and dry.  Neurological:     Mental Status: He is alert. Mental status is at baseline.  Psychiatric:        Behavior: Behavior normal.             Vital Signs: BP 93/65 (BP Location: Right Arm)   Pulse 76   Temp 98.4 F (36.9 C) (Oral)   Resp 13   Ht 5' 6 (1.676 m)   Wt 59 kg   SpO2 99%   BMI 20.98 kg/m  SpO2: SpO2: 99 % O2 Device: O2 Device: Room Air O2 Flow Rate:        Palliative Assessment/Data: 20 to 30%   Palliative Care Assessment & Plan   Patient Profile: 58 y.o. male  with past medical history of intracranial hemorrhage, venous thromboembolism, chronic ileus, antiphospholipid syndrome, large DVT and PE, severe leukoencephalopathy and cognitive impairment, poststroke dysphagia admitted on 06/18/2024 with acute on  chronic blurred vision.    Patient was found to have acute right frontal ischemic stroke on MRI after a negative head CT.  Limited treatment options and poor long-term prognosis.  Patient and family face complex medical decision making, anticipatory care needs, advance care planning needs.   This is patient's fourth admission in the past 6 months.  PMT has been consulted to assist with goals of care conversation.  Assessment: Goals of care conversation acute right frontal ischemic stroke Chronic ileus Vascular dementia Dysphagia Failure to thrive  Recommendations/Plan: Continue full code for now, family will continue discussing with patient and each other Goal is to discharge with hospice at long-term care with comfort feedings/no dietary restrictions as a priority TOC consulted for assistance with hospice referral, assistance appreciated Psychosocial and emotional support provided PMT will continue to follow and support as needed   Prognosis:  < 6 months  Discharge Planning: Skilled Nursing Facility with Hospice  Care plan was discussed with patient, patient's family, MD, TOC          Daniel Stuart SHAUNNA Fell, PA-C  Palliative Medicine Team Team phone # [phone]  Thank you for allowing the Palliative Medicine Team to assist in the care of this patient. Please utilize secure chat with additional questions, if there is no response within 30 minutes please call the above phone number.  Palliative Medicine Team providers are available by phone from 7am to 7pm daily and can be reached through the team cell phone.  Should this patient require assistance outside of these hours, please call the patient's attending physician.     Time Total: 70  Visit consisted of counseling and education dealing with the complex and emotionally intense issues of symptom management and palliative care in the setting of serious and potentially life-threatening illness. Greater than 50% of this time  was spent counseling and coordinating care related to the above assessment and plan.  Personally spent 70 minutes in patient care including extensive chart review (labs, imaging, progress/consult notes, vital signs), medically appropraite exam, discussed with treatment team, education to patient, family, and staff, documenting clinical information, medication review and management, coordination of care, and available advanced directive documents.

## 2024-06-21 NOTE — Discharge Summary (Signed)
 Physician Discharge Summary  CARL BUTNER FMW:[id number] DOB: 1966/05/05 DOA: 06/18/2024  PCP: Paseda, Folashade R, [name]  Admit date: 06/18/2024 Discharge date: 06/21/2024  Admitted From: Long-term nursing home Disposition: Long-term care nursing home  Recommendations for Outpatient Follow-up:  Follow up with PCP in 1-2 weeks Please obtain BMP/CBC/magnesium  in one week Consult palliative and hospice team at the nursing home.    Discharge Condition: fair  CODE STATUS: Full code Diet recommendation: Dysphagia 2 diet, assist feeding, supplements  Discharge summary: Patient is 58 year old gentleman from nursing home, chronically ill and bedbound status, multiple previous strokes, previous intracranial hemorrhage, recent admission due to extensive thromboembolism and started on Eliquis  sent to the hospital with complaints of blurry vision. Poor historian. He also has chronic ileus and has poor appetite. In the emergency room, he he had significant hypokalemia which is mostly chronic. Potassium 2.4. Creatinine 1.3. CT head was negative, MRI subsequently showed ischemic infarct. Admitted with acute stroke, electrolyte abnormalities   Acute ischemic stroke: In a patient with previous multiple stroke, right hemiparesis and contracture. Clinical findings, blurry vision, acute on chronic.  Chronic weakness right more than left. CT head findings, no acute findings.  Encephalomalacia. MRI of the brain, acute right insular and overlying operculum infarct.  Multiple areas of encephalomalacia and extensive chronic microvascular ischemic disease. CT angiogram head and neck, multiple chronic findings including extensive previous infarcts. 2D echocardiogram, recently done.  Ejection fraction 65 to 70%.  No intracardiac thrombus.  No shunt. Antiplatelet therapy, on Eliquis  at home.  Back on Eliquis . LDL 68.  Patient with poor nutrition.  No indication for statins. Hemoglobin A1c, 5.6. Recent  thromboembolism and suspected antiphospholipid syndrome.  Recently started on Eliquis .   Neurology following. Patient is mostly bedbound.   Previous history of intracranial hemorrhage, he is not a candidate for Coumadin therapy.  Resume Eliquis .   Chronic ileus, severe hypokalemia: Has chronic hypokalemia.  Presentation potassium 2.4.  Today potassium is 2.7. Will discharge patient with potassium chloride  40 mill equivalent 3 times daily.  Recheck in 1 week.  Continue bowel regimen.  Magnesium  is adequate.   Failure to thrive/multiple medical issues, bedbound status and recurrent hospitalizations: Multiple chronic medical issues. Untreatable medical conditions.  Long-term nursing home resident. Patient was seen by palliative care team, goal of care discussion was held.  Currently remains full code.  Family has agreed for palliative and hospice consultation at the nursing home.  He will best benefit with hospice services at the nursing home.  Currently stable to transition to nursing home.   Discharge Diagnoses:  Principal Problem:   Hypokalemia Active Problems:   Colonic pseudoobstruction   History of intracranial hemorrhage   History of intracranial hemorrhage History of severe encephalopathy History of CVA/TIA/progressive leukoencephalopathy and chronic left ICA stenosis   Antiphospholipid syndrome (HCC)   Essential hypertension   Acute stroke due to ischemia [facility])    Discharge Instructions  Discharge Instructions     Ambulatory referral to Neurology   Complete by: As directed    Follow up with Dr. Rosemarie at [facility] in 4-6 weeks. Pt is Dr. Bucky pt. Thanks.   Diet general   Complete by: As directed    Dysphagia 2 diet, encourage nutrition , assist feeding   Discharge instructions   Complete by: As directed    Recheck BMP and magnesium  in one week   Discharge wound care:   Complete by: As directed    Wound care  Daily      Comments:  1.         Cleanse R buttock wound with  NS, apply Xeroform gauze Soila [phone]) to wound bed daily and secure with silicone foam or ABD pad and tape whichever is preferred.  2.         Cleanse R ankle wound with NS, apply silver hydrofiber (Aquacel AG Gerlean [phone]) cut to fit wound bed daily and secure with silicone foam.  Attempt to keep pressure off area by offloading with pillows.   Increase activity slowly   Complete by: As directed       Allergies as of 06/21/2024   No Known Allergies      Medication List     STOP taking these medications    potassium chloride  SA 20 MEQ tablet Commonly known as: KLOR-CON  M   sodium bicarbonate  650 MG tablet       TAKE these medications    acetaminophen  325 MG tablet Commonly known as: TYLENOL  Take 2 tablets (650 mg total) by mouth every 6 (six) hours as needed for mild pain, moderate pain, fever or headache (or temp > 37.5 C (99.5 F)).   acidophilus Caps capsule Take 2 capsules by mouth daily.   apixaban  5 MG Tabs tablet Commonly known as: ELIQUIS  Take 1 tablet (5 mg total) by mouth 2 (two) times daily.   bisacodyl  10 MG suppository Commonly known as: DULCOLAX Place 1 suppository (10 mg total) rectally daily as needed for mild constipation.   Cholecalciferol  25 MCG (1000 UT) tablet Take 1,000 Units by mouth every morning.   cyanocobalamin  1000 MCG tablet Take 1 tablet (1,000 mcg total) by mouth daily.   feeding supplement Liqd Take 237 mLs by mouth 2 (two) times daily between meals. What changed:  when to take this additional instructions   nutrition supplement (JUVEN) Pack Take 1 packet by mouth 2 (two) times daily between meals. What changed: Another medication with the same name was changed. Make sure you understand how and when to take each.   gabapentin  300 MG capsule Commonly known as: NEURONTIN  TAKE 1 CAPSULE BY MOUTH BY MOUTH TWO TIMES DAILY What changed:  how much to take how to take this when to take this   ipratropium-albuterol  0.5-2.5 (3)  MG/3ML Soln Commonly known as: DUONEB Take 3 mLs by nebulization every 6 (six) hours as needed (SOB, wheezing).   ketoconazole 2 % shampoo Commonly known as: NIZORAL Apply 1 Application topically 2 (two) times a week. Monday and Thursday   liver oil-zinc  oxide 40 % ointment Commonly known as: DESITIN Apply 1 Application topically as needed for irritation (With each incontinent episode daily).   Milk of Magnesia 1200 MG/15ML suspension Generic drug: magnesium  hydroxide Take 30 mLs by mouth daily as needed for mild constipation.   ondansetron  4 MG tablet Commonly known as: ZOFRAN  Take 1 tablet (4 mg total) by mouth every 6 (six) hours as needed for nausea.   pantoprazole  40 MG tablet Commonly known as: PROTONIX  Take 1 tablet (40 mg total) by mouth at bedtime.   polyethylene glycol 17 g packet Commonly known as: MIRALAX  / GLYCOLAX  Take 17 g by mouth 2 (two) times daily.   potassium chloride  20 MEQ packet Commonly known as: KLOR-CON  Take 40 mEq by mouth 3 (three) times daily. What changed: when to take this   senna-docusate 8.6-50 MG tablet Commonly known as: Senokot-S Take 1 tablet by mouth at bedtime.   simethicone  80 MG chewable tablet Commonly known as: MYLICON Chew 1 tablet (80 mg  total) by mouth 4 (four) times daily -  before meals and at bedtime. What changed: when to take this   sodium phosphate Enem Place 1 enema rectally every other day.   FLEET SALINE ENEMA RE Place 1 application  rectally daily as needed.   Vashe Wound 0.033 % Soln Apply 1 application  topically daily. Apple to left ankle topically every day shift for wound care.               Discharge Care Instructions  (From admission, onward)           Start     Ordered   06/21/24 0000  Discharge wound care:       Comments: Wound care  Daily      Comments: 1.         Cleanse R buttock wound with NS, apply Xeroform gauze Soila [phone]) to wound bed daily and secure with silicone foam or  ABD pad and tape whichever is preferred.  2.         Cleanse R ankle wound with NS, apply silver hydrofiber (Aquacel AG Gerlean [phone]) cut to fit wound bed daily and secure with silicone foam.  Attempt to keep pressure off area by offloading with pillows.   06/21/24 1026            Follow-up Information     Rosemarie Eather RAMAN, MD. Schedule an appointment as soon as possible for a visit in 1 month(s).   Specialties: Neurology, Radiology Why: stroke clinic Contact information: [address] [place] [address] [phone]                No Known Allergies  Consultations: Palliative care team Neurology   Procedures/Studies: CT ANGIO HEAD NECK W WO CM Result Date: 06/19/2024 CLINICAL DATA:  Provided history: Stroke/TIA, determine embolic source. EXAM: CT ANGIOGRAPHY HEAD AND NECK WITH AND WITHOUT CONTRAST TECHNIQUE: Multidetector CT imaging of the head and neck was performed using the standard protocol during bolus administration of intravenous contrast. Multiplanar CT image reconstructions and MIPs were obtained to evaluate the vascular anatomy. Carotid stenosis measurements (when applicable) are obtained utilizing NASCET criteria, using the distal internal carotid diameter as the denominator. RADIATION DOSE REDUCTION: This exam was performed according to the departmental dose-optimization program which includes automated exposure control, adjustment of the mA and/or kV according to patient size and/or use of iterative reconstruction technique. CONTRAST:  75mL OMNIPAQUE  IOHEXOL  350 MG/ML SOLN COMPARISON:  Brain MRI 06/18/2024. Head CT 06/18/2024. CT angiogram head/neck 06/19/2023. FINDINGS: CT HEAD FINDINGS Brain: Known acute cortical/subcortical infarct within the right frontal operculum and right insula (MCA vascular territory), not clearly changed in extent since yesterday's brain MRI. As before, there are extensive background chronic infarcts within the bilateral  cerebral hemispheres and extensive background cerebral white matter disease. Tiny chronic infarcts within the bilateral cerebellar hemispheres which were better appreciated on yesterday's brain MRI. There is no acute intracranial hemorrhage. No extra-axial fluid collection. No evidence of an intracranial mass. No midline shift. Vascular: No hyperdense vessel.  A sclerotic calcifications. Skull: No calvarial fracture or aggressive osseous lesion. Sinuses/Orbits: No orbital mass or acute orbital finding. Minimal mucosal thickening within the bilateral maxillary sinuses. Review of the MIP images confirms the above findings CTA NECK FINDINGS Aortic arch: Common origin of the innominate and left common carotid arteries. The left vertebral artery arises directly from the aortic arch. Streak/beam hardening artifact arising from a dense contrast bolus partially obscures the left subclavian  artery. Within this limitation, there is no appreciable hemodynamically significant innominate or proximal subclavian artery stenosis. Right carotid system: CCA and ICA patent within the neck without stenosis or significant atherosclerotic disease. Left carotid system: CCA patent within the neck. As before, the ICA becomes occluded shortly beyond its origin and remains occluded throughout the remainder of the neck. Vertebral arteries: Codominant and patent within the neck without stenosis or significant atherosclerotic disease. Skeleton: Poor dentition. Dextrocurvature of the cervical spine. Nonspecific reversal of the expected cervical lordosis. Cervical spondylosis. Other neck: Subcentimeter left thyroid  lobe nodule not meeting consensus criteria for ultrasound follow-up based on size. Upper chest: No consolidation within the imaged lung apices. Emphysema. Review of the MIP images confirms the above findings CTA HEAD FINDINGS Anterior circulation: The intracranial left ICA remains occluded throughout the siphon region. As before, there  is reconstitution of enhancement within the left ICA beginning at the paraclinoid level. The right ICA is patent intracranially. The M1 middle cerebral arteries are patent. No M2 proximal branch occlusion or high-grade proximal stenosis. The anterior cerebral arteries are patent. Hypoplastic left A1 segment. No intracranial aneurysm is identified. Posterior circulation: The intracranial vertebral arteries are patent. The basilar artery is patent. The posterior cerebral arteries are patent. Posterior communicating arteries are present bilaterally. Venous sinuses: Assessment for dural venous sinus thrombosis is limited due to contrast timing. Anatomic variants: As described. Review of the MIP images confirms the above findings IMPRESSION: Non-contrast head CT: 1. Acute right MCA territory infarct, unchanged in extent as compared to yesterday's brain MRI. 2. Background advanced cerebral white matter disease and extensive chronic infarcts, as described. CTA neck: 1. Unchanged from the prior CTA of 06/19/2023, the left internal carotid artery becomes occluded shortly beyond its origin and remains occluded throughout the remainder of the neck. 2. The right common carotid, right internal carotid and bilateral vertebral arteries are patent within the neck without stenosis or significant atherosclerotic disease. 3. Aortic Atherosclerosis (ICD10-I70.0) and Emphysema (ICD10-J43.9). CTA head: 1. Unchanged from the prior CTA of 06/19/2023, the intracranial left internal carotid artery remains occluded throughout the siphon region (with reconstitution of enhancement beginning at the paraclinoid level). 2. No proximal intracranial large vessel occlusion or high-grade proximal arterial stenosis identified elsewhere. Electronically Signed   By: Rockey Childs D.O.   On: 06/19/2024 16:04   MR Brain W and Wo Contrast Result Date: 06/18/2024 CLINICAL DATA:  Stroke, follow up patient reporting blurred vision, new onset - difficult to  discern further history EXAM: MRI HEAD WITHOUT AND WITH CONTRAST TECHNIQUE: Multiplanar, multiecho pulse sequences of the brain and surrounding structures were obtained without and with intravenous contrast. CONTRAST:  6mL GADAVIST  GADOBUTROL  1 MMOL/ML IV SOLN COMPARISON:  MRI head 05/08/2024. FINDINGS: Brain: Acute right insular and overlying operculuminfarct involving both cortex and subcortical white matter with associated restricted diffusion. Associated edema without mass effect or midline shift. Bilateral parieto-occipital and bifrontal encephalomalacia. Evidence of prior hemorrhage in the right parietal and occipital region. Extensive confluent T2/FLAIR hyperintensity in the white matter. Vascular: Normal flow voids. Skull and upper cervical spine: Normal marrow signal. Sinuses/Orbits: Negative. IMPRESSION: 1. Acute right insular and overlying operculum infarct. No mass effect. 2. Multiple areas of encephalomalacia and extensive chronic microvascular ischemic disease. Electronically Signed   By: Gilmore GORMAN Molt M.D.   On: 06/18/2024 22:05   CT ABDOMEN PELVIS W CONTRAST Result Date: 06/18/2024 CLINICAL DATA:  Abdominal distension. EXAM: CT ABDOMEN AND PELVIS WITH CONTRAST TECHNIQUE: Multidetector CT imaging of the abdomen and pelvis  was performed using the standard protocol following bolus administration of intravenous contrast. RADIATION DOSE REDUCTION: This exam was performed according to the departmental dose-optimization program which includes automated exposure control, adjustment of the mA and/or kV according to patient size and/or use of iterative reconstruction technique. CONTRAST:  75mL OMNIPAQUE  IOHEXOL  350 MG/ML SOLN COMPARISON:  CT chest abdomen and pelvis 04/13/2024. FINDINGS: Lower chest: Patchy ground-glass opacities are seen in both lung bases. Airspace disease in the right middle lobe has decreased, but has not completely resolved. Hepatobiliary: No focal liver abnormality is seen. No  gallstones, gallbladder wall thickening, or biliary dilatation. Pancreas: Unremarkable. No pancreatic ductal dilatation or surrounding inflammatory changes. Spleen: Normal in size without focal abnormality. Adrenals/Urinary Tract: There is scarring in the inferior pole the right kidney. There are punctate bilateral renal calculi measuring up to 5 mm. These are unchanged. There is no hydronephrosis or perinephric fluid. The adrenal glands and bladder are within normal limits. Stomach/Bowel: The colon is diffusely dilated with air-fluid levels to the level the rectum measuring up to 8.7 cm. There is no twisting or mesenteric edema identified. No colonic wall thickening identified. The appendix appears within normal limits. Small bowel and stomach are within normal limits. No pneumatosis or free air. Vascular/Lymphatic: Aorta and IVC are normal in size per there are findings concerning for nonocclusive thrombus within the inferior vena cava left common femoral vein, and left external iliac vein. No enlarged lymph nodes are identified. Reproductive: Prostate is unremarkable. Other: No abdominal wall hernia or abnormality. No abdominopelvic ascites. Musculoskeletal: No fracture is seen. IMPRESSION: 1. Diffuse colonic dilatation with air-fluid levels to the level of the rectum. Findings are concerning for colonic ileus. No evidence for bowel obstruction. 2. Findings nonocclusive thrombus within the inferior vena cava, left common femoral vein, and left external iliac vein. 3. Nonobstructing bilateral renal calculi. 4. Patchy ground-glass opacities in both lung bases, likely infectious/inflammatory. Electronically Signed   By: Greig Pique M.D.   On: 06/18/2024 17:13   CT Head Wo Contrast Result Date: 06/18/2024 CLINICAL DATA:  Bilateral blurred vision for greater than 24 hours EXAM: CT HEAD WITHOUT CONTRAST TECHNIQUE: Contiguous axial images were obtained from the base of the skull through the vertex without  intravenous contrast. RADIATION DOSE REDUCTION: This exam was performed according to the departmental dose-optimization program which includes automated exposure control, adjustment of the mA and/or kV according to patient size and/or use of iterative reconstruction technique. COMPARISON:  05/07/2024 FINDINGS: Brain: Stable diffuse cerebral atrophy. Stable chronic ischemic changes are again seen throughout the bilateral subcortical and periventricular white matter, with stable encephalomalacia in the right occipital region. No evidence of acute infarct or hemorrhage. The lateral ventricles and midline structures are unremarkable. No acute extra-axial fluid collections. No mass effect. Vascular: No hyperdense vessel or unexpected calcification. Skull: Normal. Negative for fracture or focal lesion. Sinuses/Orbits: No acute finding. Other: None. IMPRESSION: 1. No acute intracranial process. 2. Stable cortical atrophy, encephalomalacia, and chronic ischemic changes as above. Electronically Signed   By: Ozell Daring M.D.   On: 06/18/2024 17:11   DG Chest Portable 1 View Result Date: 06/18/2024 CLINICAL DATA:  Visual changes EXAM: PORTABLE CHEST 1 VIEW COMPARISON:  02/26/2024 FINDINGS: Single frontal view of the chest demonstrates a stable cardiac silhouette. Stable bibasilar scarring. No airspace disease, effusion, or pneumothorax. No acute bony abnormalities. IMPRESSION: 1. No acute intrathoracic process. Electronically Signed   By: Ozell Daring M.D.   On: 06/18/2024 16:16   (Echo, Carotid, EGD, Colonoscopy,  ERCP)    Subjective: Patient seen in the morning rounds.  Denies any complaints.  He agrees to go back to nursing home.   Discharge Exam: Vitals:   06/21/24 0425 06/21/24 0756  BP: (!) 84/57 93/65  Pulse: 70 76  Resp: 11 13  Temp: 99.2 F (37.3 C) 98.4 F (36.9 C)  SpO2: 95% 99%   Vitals:   06/21/24 0410 06/21/24 0415 06/21/24 0425 06/21/24 0756  BP: (!) 84/55  (!) 84/57 93/65  Pulse: 77  71 70 76  Resp: (!) 9 10 11 13   Temp:   99.2 F (37.3 C) 98.4 F (36.9 C)  TempSrc:   Axillary Oral  SpO2: 95% 94% 95% 99%  Weight:   59 kg   Height:        General: Pt is alert, awake, not in acute distress Frail and debilitated.  Chronically sick looking.  Not in any distress.  He has a flat affect.  He interacts but talks very slowly. Cardiovascular: RRR, S1/S2 +, no rubs, no gallops Respiratory: CTA bilaterally, no wheezing, no rhonchi Abdominal: Soft, distended.  Nontender.  Bowel sound present. Extremities: no edema, no cyanosis Alert and awake.  Mostly oriented. Gross generalized weakness.  Right upper and lower extremity with power 2/5, contracted.    The results of significant diagnostics from this hospitalization (including imaging, microbiology, ancillary and laboratory) are listed below for reference.     Microbiology: Recent Results (from the past 240 hours)  Resp panel by RT-PCR (RSV, Flu A&B, Covid) Anterior Nasal Swab     Status: None   Collection Time: 06/18/24  4:15 PM   Specimen: Anterior Nasal Swab  Result Value Ref Range Status   SARS Coronavirus 2 by RT PCR NEGATIVE NEGATIVE Final   Influenza A by PCR NEGATIVE NEGATIVE Final   Influenza B by PCR NEGATIVE NEGATIVE Final    Comment: (NOTE) The Xpert Xpress SARS-CoV-2/FLU/RSV plus assay is intended as an aid in the diagnosis of influenza from Nasopharyngeal swab specimens and should not be used as a sole basis for treatment. Nasal washings and aspirates are unacceptable for Xpert Xpress SARS-CoV-2/FLU/RSV testing.  Fact Sheet for Patients: BloggerCourse.com  Fact Sheet for Healthcare Providers: SeriousBroker.it  This test is not yet approved or cleared by the [place]  FDA and has been authorized for detection and/or diagnosis of SARS-CoV-2 by FDA under an Emergency Use Authorization (EUA). This EUA will remain in effect (meaning this test can be  used) for the duration of the COVID-19 declaration under Section 564(b)(1) of the Act, 21 U.S.C. section 360bbb-3(b)(1), unless the authorization is terminated or revoked.     Resp Syncytial Virus by PCR NEGATIVE NEGATIVE Final    Comment: (NOTE) Fact Sheet for Patients: BloggerCourse.com  Fact Sheet for Healthcare Providers: SeriousBroker.it  This test is not yet approved or cleared by the [place]  FDA and has been authorized for detection and/or diagnosis of SARS-CoV-2 by FDA under an Emergency Use Authorization (EUA). This EUA will remain in effect (meaning this test can be used) for the duration of the COVID-19 declaration under Section 564(b)(1) of the Act, 21 U.S.C. section 360bbb-3(b)(1), unless the authorization is terminated or revoked.  Performed at [facility], [address], [place], [place] [address]   MRSA Next Gen by PCR, Nasal     Status: Abnormal   Collection Time: 06/19/24 11:23 AM   Specimen: Nasal Mucosa; Nasal Swab  Result Value Ref Range Status   MRSA by PCR Next  Gen DETECTED (A) NOT DETECTED Final    Comment: RESULT CALLED TO, READ BACK BY AND VERIFIED WITH: RN TSABRA PARTRIDGE [phone] @ 1547 FH (NOTE) The GeneXpert MRSA Assay (FDA approved for NASAL specimens only), is one component of a comprehensive MRSA colonization surveillance program. It is not intended to diagnose MRSA infection nor to guide or monitor treatment for MRSA infections. Test performance is not FDA approved in patients less than 33 years old. Performed at [facility], [address], [place], [place] [address]      Labs: BNP (last 3 results) No results for input(s): BNP in the last 8760 hours. Basic Metabolic Panel: Recent Labs  Lab 06/18/24 1511 06/19/24 0530 06/20/24 0424 06/21/24 0408  NA 147* 146* 145 144  K 2.4* 3.0* 2.9* 2.7*  CL 113* 113* 113* 116*  CO2 24 22 15* 19*  GLUCOSE 101* 87 79 81  BUN 19  12 10 11   CREATININE 1.31* 1.22 1.14 1.14  CALCIUM  8.6* 7.9* 7.8* 8.0*  MG 1.8 2.0  --   --    Liver Function Tests: Recent Labs  Lab 06/18/24 1511  AST 17  ALT 12  ALKPHOS 99  BILITOT 0.5  PROT 6.5  ALBUMIN 2.5*   No results for input(s): LIPASE, AMYLASE in the last 168 hours. Recent Labs  Lab 06/18/24 1556  AMMONIA <13   CBC: Recent Labs  Lab 06/18/24 1511 06/19/24 0530  WBC 8.6 9.2  NEUTROABS 5.9  --   HGB 12.8* 12.5*  HCT 39.8 38.4*  MCV 84.9 84.2  PLT 254 262   Cardiac Enzymes: No results for input(s): CKTOTAL, CKMB, CKMBINDEX, TROPONINI in the last 168 hours. BNP: Invalid input(s): POCBNP CBG: No results for input(s): GLUCAP in the last 168 hours. D-Dimer No results for input(s): DDIMER in the last 72 hours. Hgb A1c Recent Labs    06/19/24 0530  HGBA1C 5.6   Lipid Profile Recent Labs    06/19/24 0530  CHOL 113  HDL 31*  LDLCALC 68  TRIG 70  CHOLHDL 3.6   Thyroid  function studies No results for input(s): TSH, T4TOTAL, T3FREE, THYROIDAB in the last 72 hours.  Invalid input(s): FREET3 Anemia work up No results for input(s): VITAMINB12, FOLATE, FERRITIN, TIBC, IRON, RETICCTPCT in the last 72 hours. Urinalysis    Component Value Date/Time   COLORURINE STRAW (A) 06/19/2024 0603   APPEARANCEUR CLEAR 06/19/2024 0603   LABSPEC 1.023 06/19/2024 0603   PHURINE 6.0 06/19/2024 0603   GLUCOSEU NEGATIVE 06/19/2024 0603   HGBUR NEGATIVE 06/19/2024 0603   BILIRUBINUR NEGATIVE 06/19/2024 0603   BILIRUBINUR negative 07/10/2021 1202   BILIRUBINUR neg 06/11/2020 0901   KETONESUR NEGATIVE 06/19/2024 0603   PROTEINUR NEGATIVE 06/19/2024 0603   UROBILINOGEN 0.2 07/10/2021 1202   UROBILINOGEN 0.2 11/30/2017 1030   NITRITE NEGATIVE 06/19/2024 0603   LEUKOCYTESUR NEGATIVE 06/19/2024 0603   Sepsis Labs Recent Labs  Lab 06/18/24 1511 06/19/24 0530  WBC 8.6 9.2   Microbiology Recent Results (from the past 240  hours)  Resp panel by RT-PCR (RSV, Flu A&B, Covid) Anterior Nasal Swab     Status: None   Collection Time: 06/18/24  4:15 PM   Specimen: Anterior Nasal Swab  Result Value Ref Range Status   SARS Coronavirus 2 by RT PCR NEGATIVE NEGATIVE Final   Influenza A by PCR NEGATIVE NEGATIVE Final   Influenza B by PCR NEGATIVE NEGATIVE Final    Comment: (NOTE) The Xpert Xpress SARS-CoV-2/FLU/RSV plus assay is intended as an  aid in the diagnosis of influenza from Nasopharyngeal swab specimens and should not be used as a sole basis for treatment. Nasal washings and aspirates are unacceptable for Xpert Xpress SARS-CoV-2/FLU/RSV testing.  Fact Sheet for Patients: BloggerCourse.com  Fact Sheet for Healthcare Providers: SeriousBroker.it  This test is not yet approved or cleared by the [place]  FDA and has been authorized for detection and/or diagnosis of SARS-CoV-2 by FDA under an Emergency Use Authorization (EUA). This EUA will remain in effect (meaning this test can be used) for the duration of the COVID-19 declaration under Section 564(b)(1) of the Act, 21 U.S.C. section 360bbb-3(b)(1), unless the authorization is terminated or revoked.     Resp Syncytial Virus by PCR NEGATIVE NEGATIVE Final    Comment: (NOTE) Fact Sheet for Patients: BloggerCourse.com  Fact Sheet for Healthcare Providers: SeriousBroker.it  This test is not yet approved or cleared by the [place]  FDA and has been authorized for detection and/or diagnosis of SARS-CoV-2 by FDA under an Emergency Use Authorization (EUA). This EUA will remain in effect (meaning this test can be used) for the duration of the COVID-19 declaration under Section 564(b)(1) of the Act, 21 U.S.C. section 360bbb-3(b)(1), unless the authorization is terminated or revoked.  Performed at [facility], [address], [place],  [place] [address]   MRSA Next Gen by PCR, Nasal     Status: Abnormal   Collection Time: 06/19/24 11:23 AM   Specimen: Nasal Mucosa; Nasal Swab  Result Value Ref Range Status   MRSA by PCR Next Gen DETECTED (A) NOT DETECTED Final    Comment: RESULT CALLED TO, READ BACK BY AND VERIFIED WITH: RN TSABRA PARTRIDGE [phone] @ 1547 FH (NOTE) The GeneXpert MRSA Assay (FDA approved for NASAL specimens only), is one component of a comprehensive MRSA colonization surveillance program. It is not intended to diagnose MRSA infection nor to guide or monitor treatment for MRSA infections. Test performance is not FDA approved in patients less than 26 years old. Performed at [facility], [address], [place], [place] [address]      Time coordinating discharge: 40 minutes  SIGNED:   Renato Applebaum, MD  [facility] 06/21/2024, 11:41 AM

## 2024-06-21 NOTE — Progress Notes (Signed)
 Report called, spoke to [facility], LPN

## 2024-06-21 NOTE — TOC Progression Note (Addendum)
 Transition of Care [facility]) - Progression Note    Patient Details  Name: Daniel Stuart MRN: [id number] Date of Birth: 1966-10-13  Transition of Care [facility]) CM/SW Contact  Isaiah Public, LCSWA Phone Number: 06/21/2024, 10:58 AM  Clinical Narrative:     CSW spoke with [place] with [facility] who confirmed patient can return back to facility today if medically stable. CSW will continue to follow.  Update- CSW received consult from Josseline NP with palliative for hospice services to follow patient at SNF. CSW spoke with patients sister [name]. [name] gave CSW permission to make referral to [facility] for hospice services to follow patient at SNF. CSW made referral to Shawn with [facility] for hospice services to follow patient at SNF.   Expected Discharge Plan: Skilled Nursing Facility Barriers to Discharge: Continued Medical Work up               Expected Discharge Plan and Services In-house Referral: Clinical Social Work     Living arrangements for the past 2 months: Skilled Nursing Facility Expected Discharge Date: 06/21/24                                     Social Drivers of Health (SDOH) Interventions SDOH Screenings   Food Insecurity: No Food Insecurity (06/19/2024)  Housing: Low Risk  (06/19/2024)  Transportation Needs: No Transportation Needs (06/19/2024)  Utilities: Not At Risk (06/19/2024)  Alcohol Screen: Low Risk  (07/31/2021)  Depression (PHQ2-9): Low Risk  (04/13/2022)  Financial Resource Strain: Medium Risk (07/31/2021)  Physical Activity: Inactive (07/31/2021)  Social Connections: Socially Isolated (07/31/2021)  Stress: No Stress Concern Present (07/31/2021)  Tobacco Use: High Risk (06/19/2024)    Readmission Risk Interventions    04/16/2024   10:19 AM  Readmission Risk Prevention Plan  Transportation Screening Complete  PCP or Specialist Appt within 5-7 Days Complete  Home Care Screening Complete  Medication Review (RN CM) Complete

## 2024-06-21 NOTE — Progress Notes (Signed)
 [facility] 6E05C Evalene Battiest   [facility] Liaison Note  Received request from Isaiah Public, Transitions of Care Manager, for hospice at facility after discharge. Spoke with [name], patient's sister to initiate education related to hospice philosophy, services, and team approach to care.  Patient/family verbalized understanding of information given. Per discussion, the plan for discharge back to [facility] by [facility]/EMS on Thursday, 06/21/24.    DME needs discussed.  Daughter is unsure what his DME needs are at this time as well as what DME patient currently owns. DME will be determined at initial visit.  Please provide prescriptions at discharge as needed to ensure ongoing symptom management.  [facility] information and contact numbers given to sister, [name].  Above information shared with Isaiah, Transitions of Care Manager. Please call with any questions or concerns. Thank you for the opportunity to participate in this patient's care.  [name], RN BSN [facility] Liaison [phone]

## 2024-06-21 NOTE — TOC Transition Note (Signed)
 Transition of Care [facility]) - Discharge Note   Patient Details  Name: Daniel Stuart MRN: [id number] Date of Birth: 07-06-1966  Transition of Care [facility]) CM/SW Contact:  Isaiah Public, LCSWA Phone Number: 06/21/2024, 12:19 PM   Clinical Narrative:     Patient will DC to: [facility]   Anticipated DC date: 06/21/2024  Family notified: [name]   Transport by: [facility]  ?  Per MD patient ready for DC to [facility] with hospice services to follow . RN, patient, patient's family, Shawn with [facility],and facility notified of DC. Discharge Summary sent to facility. RN given number for report [phone] RM# [id number]. DC packet on chart. Ambulance transport requested for patient.  CSW signing off.   Final next level of care: Skilled Nursing Facility Barriers to Discharge: No Barriers Identified   Patient Goals and CMS Choice     Choice offered to / list presented to : Sibling (sister [name])      Discharge Placement              Patient chooses bed at: [facility] Patient to be transferred to facility by: PTAR Name of family member notified: [name] Patient and family notified of of transfer: 06/21/24  Discharge Plan and Services Additional resources added to the After Visit Summary for   In-house Referral: Clinical Social Work                                   Social Drivers of Health (SDOH) Interventions SDOH Screenings   Food Insecurity: No Food Insecurity (06/19/2024)  Housing: Low Risk  (06/19/2024)  Transportation Needs: No Transportation Needs (06/19/2024)  Utilities: Not At Risk (06/19/2024)  Alcohol Screen: Low Risk  (07/31/2021)  Depression (PHQ2-9): Low Risk  (04/13/2022)  Financial Resource Strain: Medium Risk (07/31/2021)  Physical Activity: Inactive (07/31/2021)  Social Connections: Socially Isolated (07/31/2021)  Stress: No Stress Concern Present (07/31/2021)  Tobacco Use: High Risk (06/19/2024)      Readmission Risk Interventions    06/21/2024   11:18 AM 04/16/2024   10:19 AM  Readmission Risk Prevention Plan  Transportation Screening Complete Complete  PCP or Specialist Appt within 5-7 Days  Complete  Home Care Screening  Complete  Medication Review (RN CM)  Complete  Palliative Care Screening Complete   Skilled Nursing Facility Complete

## 2024-07-10 ENCOUNTER — Inpatient Hospital Stay (HOSPITAL_COMMUNITY)
Admission: EM | Admit: 2024-07-10 | Discharge: 2024-07-16 | DRG: 871 | Disposition: A | Discharge status: Skilled Nursing Facility | Source: Skilled Nursing Facility | Attending: Family Medicine | Admitting: Family Medicine

## 2024-07-10 ENCOUNTER — Emergency Department (HOSPITAL_COMMUNITY)

## 2024-07-10 ENCOUNTER — Other Ambulatory Visit: Payer: Self-pay

## 2024-07-10 DIAGNOSIS — Z515 Encounter for palliative care: Secondary | ICD-10-CM

## 2024-07-10 DIAGNOSIS — F419 Anxiety disorder, unspecified: Secondary | ICD-10-CM | POA: Diagnosis present

## 2024-07-10 DIAGNOSIS — Z635 Disruption of family by separation and divorce: Secondary | ICD-10-CM | POA: Diagnosis not present

## 2024-07-10 DIAGNOSIS — Z7901 Long term (current) use of anticoagulants: Secondary | ICD-10-CM | POA: Diagnosis not present

## 2024-07-10 DIAGNOSIS — Z86718 Personal history of other venous thrombosis and embolism: Secondary | ICD-10-CM

## 2024-07-10 DIAGNOSIS — F17210 Nicotine dependence, cigarettes, uncomplicated: Secondary | ICD-10-CM | POA: Diagnosis present

## 2024-07-10 DIAGNOSIS — R627 Adult failure to thrive: Secondary | ICD-10-CM | POA: Diagnosis present

## 2024-07-10 DIAGNOSIS — I739 Peripheral vascular disease, unspecified: Secondary | ICD-10-CM | POA: Diagnosis present

## 2024-07-10 DIAGNOSIS — I2510 Atherosclerotic heart disease of native coronary artery without angina pectoris: Secondary | ICD-10-CM | POA: Diagnosis not present

## 2024-07-10 DIAGNOSIS — I82221 Chronic embolism and thrombosis of inferior vena cava: Secondary | ICD-10-CM | POA: Diagnosis present

## 2024-07-10 DIAGNOSIS — Z825 Family history of asthma and other chronic lower respiratory diseases: Secondary | ICD-10-CM

## 2024-07-10 DIAGNOSIS — N201 Calculus of ureter: Secondary | ICD-10-CM | POA: Diagnosis present

## 2024-07-10 DIAGNOSIS — Z8673 Personal history of transient ischemic attack (TIA), and cerebral infarction without residual deficits: Secondary | ICD-10-CM

## 2024-07-10 DIAGNOSIS — I8290 Acute embolism and thrombosis of unspecified vein: Secondary | ICD-10-CM

## 2024-07-10 DIAGNOSIS — Z8249 Family history of ischemic heart disease and other diseases of the circulatory system: Secondary | ICD-10-CM | POA: Diagnosis not present

## 2024-07-10 DIAGNOSIS — A419 Sepsis, unspecified organism: Secondary | ICD-10-CM | POA: Diagnosis not present

## 2024-07-10 DIAGNOSIS — L89512 Pressure ulcer of right ankle, stage 2: Secondary | ICD-10-CM | POA: Diagnosis present

## 2024-07-10 DIAGNOSIS — L89892 Pressure ulcer of other site, stage 2: Secondary | ICD-10-CM | POA: Diagnosis present

## 2024-07-10 DIAGNOSIS — Z7401 Bed confinement status: Secondary | ICD-10-CM

## 2024-07-10 DIAGNOSIS — Z833 Family history of diabetes mellitus: Secondary | ICD-10-CM

## 2024-07-10 DIAGNOSIS — I82522 Chronic embolism and thrombosis of left iliac vein: Secondary | ICD-10-CM | POA: Diagnosis not present

## 2024-07-10 DIAGNOSIS — I639 Cerebral infarction, unspecified: Secondary | ICD-10-CM | POA: Diagnosis present

## 2024-07-10 LAB — COMPREHENSIVE METABOLIC PANEL WITH GFR
ALT: 37 U/L (ref 0–44)
AST: 52 U/L — ABNORMAL HIGH (ref 15–41)
Albumin: 2.3 g/dL — ABNORMAL LOW (ref 3.5–5.0)
Alkaline Phosphatase: 97 U/L (ref 38–126)
Anion gap: 12 (ref 5–15)
BUN: 16 mg/dL (ref 6–20)
CO2: 22 mmol/L (ref 22–32)
Calcium: 9 mg/dL (ref 8.9–10.3)
Chloride: 98 mmol/L (ref 98–111)
Creatinine, Ser: 1.11 mg/dL (ref 0.61–1.24)
GFR, Estimated: >60 mL/min (ref >60)
Glucose, Bld: 96 mg/dL (ref 70–99)
Potassium: 5.4 mmol/L — ABNORMAL HIGH (ref 3.5–5.1)
Sodium: 132 mmol/L — ABNORMAL LOW (ref 135–145)
Total Bilirubin: 0.5 mg/dL (ref 0.0–1.2)
Total Protein: 7.7 g/dL (ref 6.5–8.1)

## 2024-07-10 LAB — CBC WITH DIFFERENTIAL/PLATELET
Abs Immature Granulocytes: 0.08 K/uL — ABNORMAL HIGH (ref 0.00–0.07)
Basophils Absolute: 0 K/uL (ref 0.0–0.1)
Basophils Relative: 0 %
Eosinophils Absolute: 0 K/uL (ref 0.0–0.5)
Eosinophils Relative: 0 %
HCT: 42.3 % (ref 39.0–52.0)
Hemoglobin: 13.5 g/dL (ref 13.0–17.0)
Immature Granulocytes: 1 %
Lymphocytes Relative: 11 %
Lymphs Abs: 1.6 K/uL (ref 0.7–4.0)
MCH: 27.2 pg (ref 26.0–34.0)
MCHC: 31.9 g/dL (ref 30.0–36.0)
MCV: 85.1 fL (ref 80.0–100.0)
Monocytes Absolute: 0.9 K/uL (ref 0.1–1.0)
Monocytes Relative: 6 %
Neutro Abs: 11.7 K/uL — ABNORMAL HIGH (ref 1.7–7.7)
Neutrophils Relative %: 82 %
Platelets: 449 K/uL — ABNORMAL HIGH (ref 150–400)
RBC: 4.97 MIL/uL (ref 4.22–5.81)
RDW: 14 % (ref 11.5–15.5)
WBC: 14.4 K/uL — ABNORMAL HIGH (ref 4.0–10.5)
nRBC: 0 % (ref 0.0–0.2)

## 2024-07-10 LAB — URINALYSIS, W/ REFLEX TO CULTURE (INFECTION SUSPECTED)
Bilirubin Urine: NEGATIVE
Glucose, UA: NEGATIVE mg/dL
Hgb urine dipstick: NEGATIVE
Ketones, ur: NEGATIVE mg/dL
Leukocytes,Ua: NEGATIVE
Nitrite: NEGATIVE
Protein, ur: NEGATIVE mg/dL
Specific Gravity, Urine: 1.011 (ref 1.005–1.030)
pH: 5 (ref 5.0–8.0)

## 2024-07-10 LAB — PROTIME-INR
INR: 2.2 — ABNORMAL HIGH (ref 0.8–1.2)
Prothrombin Time: 25.2 s — ABNORMAL HIGH (ref 11.4–15.2)

## 2024-07-10 LAB — I-STAT CG4 LACTIC ACID, ED: Lactic Acid, Venous: 2.5 mmol/L (ref 0.5–1.9)

## 2024-07-10 LAB — C-REACTIVE PROTEIN: CRP: 13.2 mg/dL — ABNORMAL HIGH (ref <1.0)

## 2024-07-10 LAB — SEDIMENTATION RATE: Sed Rate: 54 mm/h — ABNORMAL HIGH (ref 0–16)

## 2024-07-10 MED ORDER — ACETAMINOPHEN 650 MG RE SUPP: 650.0000 mg | Freq: Four times a day (QID) | RECTAL | Status: DC | PRN

## 2024-07-10 MED ORDER — LACTATED RINGERS IV BOLUS
1000.0000 mL | Freq: Once | INTRAVENOUS | Status: AC
  Administered 2024-07-10: 1000 mL via INTRAVENOUS

## 2024-07-10 MED ORDER — PIPERACILLIN-TAZOBACTAM 3.375 G IVPB 30 MIN
3.3750 g | Freq: Once | INTRAVENOUS | Status: AC
  Administered 2024-07-10: 3.375 g via INTRAVENOUS
  Filled 2024-07-10: qty 50

## 2024-07-10 MED ORDER — IOHEXOL 350 MG/ML SOLN
75.0000 mL | Freq: Once | INTRAVENOUS | Status: AC | PRN
  Administered 2024-07-10: 75 mL via INTRAVENOUS

## 2024-07-10 MED ORDER — VANCOMYCIN HCL 1250 MG/250ML IV SOLN
1250.0000 mg | Freq: Once | INTRAVENOUS | Status: AC
  Administered 2024-07-10: 1250 mg via INTRAVENOUS
  Filled 2024-07-10: qty 250

## 2024-07-10 MED ORDER — ACETAMINOPHEN 325 MG PO TABS
650.0000 mg | ORAL_TABLET | Freq: Once | ORAL | Status: AC
  Administered 2024-07-10: 650 mg via ORAL
  Filled 2024-07-10: qty 2

## 2024-07-10 MED ORDER — HEPARIN (PORCINE) 25000 UT/250ML-% IV SOLN
1050.0000 [IU]/h | INTRAVENOUS | Status: AC
  Administered 2024-07-10: 950 [IU]/h via INTRAVENOUS
  Administered 2024-07-11: 1250 [IU]/h via INTRAVENOUS
  Administered 2024-07-12: 1100 [IU]/h via INTRAVENOUS
  Filled 2024-07-10 (×3): qty 250

## 2024-07-10 MED ORDER — BISACODYL 5 MG PO TBEC: 5.0000 mg | DELAYED_RELEASE_TABLET | Freq: Every day | ORAL | Status: DC | PRN

## 2024-07-10 MED ORDER — FENTANYL CITRATE PF 50 MCG/ML IJ SOSY
50.0000 ug | PREFILLED_SYRINGE | Freq: Once | INTRAMUSCULAR | Status: AC
  Administered 2024-07-10: 50 ug via INTRAVENOUS
  Filled 2024-07-10: qty 1

## 2024-07-10 MED ORDER — ACETAMINOPHEN 325 MG PO TABS
650.0000 mg | ORAL_TABLET | Freq: Four times a day (QID) | ORAL | Status: DC | PRN
  Administered 2024-07-11 – 2024-07-15 (×3): 650 mg via ORAL
  Filled 2024-07-10 (×3): qty 2

## 2024-07-10 NOTE — Progress Notes (Incomplete)
 PHARMACY - ANTICOAGULATION CONSULT NOTE  Pharmacy Consult for Heparin  Indication: DVT  No Known Allergies  Patient Measurements: Height: 5' 6 (167.6 cm) Weight: 59 kg (130 lb 1.1 oz) IBW/kg (Calculated) : 63.8 HEPARIN  DW (KG): 59  Vital Signs: Temp: 101.2 F (38.4 C) (09/09 1700) Temp Source: Axillary (09/09 1700) BP: 114/84 (09/09 1700) Pulse Rate: 124 (09/09 1700)  Labs: Recent Labs    07/10/24 1539  LABPROT 25.2*  INR 2.2*  CREATININE 1.11    Estimated Creatinine Clearance: 59.1 mL/min (by C-G formula based on SCr of 1.11 mg/dL).   Medical History: Past Medical History:  Diagnosis Date   Anxiety    Arrhythmia as indication for cardiac pacemaker replacement 03/2020   Frequent falls 03/2020   History of loop recorder 04/2017   ICH (intracerebral hemorrhage) (HCC) 06/19/2023   Stroke (cerebrum) (HCC)    Tobacco use    Urinary frequency 09/2019   Vitamin D  deficiency     Assessment: Palliative pt with terminal chronic cerebrovascular disease. PTA med list has apixaban  listed, however med hx not yet performed. According to dispense record a 14 day supply was filled on 06/21/24. Will order an aPTT with first blood draw.  Goal of Therapy:  Heparin  level 0.3-0.7 units/ml Monitor platelets by anticoagulation protocol: Yes   Plan:  Give 3550 units bolus x 1 Start heparin  infusion at 950 units/hr Check anti-Xa level in 6 hours and daily while on heparin  Continue to monitor H&H and platelets  Prentice DOROTHA Favors, PharmD PGY1 Health-System Pharmacy Administration and Leadership Resident [facility]  07/10/2024 8:16 PM

## 2024-07-10 NOTE — Progress Notes (Signed)
 PHARMACY - ANTICOAGULATION CONSULT NOTE  Pharmacy Consult for Heparin  Indication: DVT  No Known Allergies  Patient Measurements: Height: 5' 6 (167.6 cm) Weight: 59 kg (130 lb 1.1 oz) IBW/kg (Calculated) : 63.8 HEPARIN  DW (KG): 59  Vital Signs: Temp: 101.2 F (38.4 C) (09/09 1700) Temp Source: Axillary (09/09 1700) BP: 114/84 (09/09 1700) Pulse Rate: 124 (09/09 1700)  Labs: Recent Labs    07/10/24 1539 07/10/24 2007  HGB  --  13.5  HCT  --  42.3  PLT  --  449*  LABPROT 25.2*  --   INR 2.2*  --   CREATININE 1.11  --     Estimated Creatinine Clearance: 59.1 mL/min (by C-G formula based on SCr of 1.11 mg/dL).   Medical History: Past Medical History:  Diagnosis Date   Anxiety    Arrhythmia as indication for cardiac pacemaker replacement 03/2020   Frequent falls 03/2020   History of loop recorder 04/2017   ICH (intracerebral hemorrhage) (HCC) 06/19/2023   Stroke (cerebrum) (HCC)    Tobacco use    Urinary frequency 09/2019   Vitamin D  deficiency     Assessment: Palliative pt with terminal chronic cerebrovascular disease. Pt on PTA apixaban  and last dose on 9/9 at 8 AM. Will utilize heparin  for now and consider restarting apixaban  when stable.  Goal of Therapy:  Heparin  level 0.3-0.7 units/ml Monitor platelets by anticoagulation protocol: Yes   Plan:  Start heparin  infusion at 950 units/hr Check anti-Xa level in 6 hours and daily while on heparin  Continue to monitor H&H and platelets Follow up ability to transition back to apixaban   Prentice DOROTHA Favors, PharmD PGY1 Health-System Pharmacy Administration and Leadership Resident [facility]  07/10/2024 8:45 PM

## 2024-07-10 NOTE — Progress Notes (Signed)
 ED Pharmacy Antibiotic Sign Off An antibiotic consult was received from an ED provider for vancomycin  per pharmacy dosing for sepsis. A chart review was completed to assess appropriateness.   The following one time order(s) were placed:  Vancomycin  1250 mg IV x 1  Further antibiotic and/or antibiotic pharmacy consults should be ordered by the admitting provider if indicated.   Thank you for allowing pharmacy to be a part of this patient's care.   Dorn Poot, [facility]  Clinical Pharmacist 07/10/24 2:11 PM

## 2024-07-10 NOTE — ED Provider Notes (Signed)
[place] EMERGENCY DEPARTMENT AT [facility] Provider Note  CSN: [id number] Arrival date & time: 07/10/24 1354  Chief Complaint(s) Failure To Thrive  HPI Daniel Stuart is a 58 y.o. male who is here today from [facility] where patient is a palliative care patient.  Patient with a history of multiple prior CVAs.  He is here today out of concern for infection.  Has had decreased p.o. intake.   Past Medical History Past Medical History:  Diagnosis Date   Anxiety    Arrhythmia as indication for cardiac pacemaker replacement 03/2020   Frequent falls 03/2020   History of loop recorder 04/2017   ICH (intracerebral hemorrhage) (HCC) 06/19/2023   Stroke (cerebrum) (HCC)    Tobacco use    Urinary frequency 09/2019   Vitamin D  deficiency    Patient Active Problem List   Diagnosis Date Noted   Acute stroke due to ischemia (HCC) 06/19/2024   Acute DVT (deep venous thrombosis) (HCC) 05/08/2024   airspace disease 04/13/2024   Prolonged QT interval 04/13/2024   Ileus (HCC) 03/05/2024   Edema of right upper arm 02/28/2024   Protein calorie malnutrition (HCC) 02/27/2024   Pressure injury of skin 02/24/2024   Colonic pseudoobstruction 02/23/2024   Colitis 02/23/2024   Hypokalemia 02/23/2024   History of intracranial hemorrhage 02/23/2024   Essential hypertension 02/23/2024   Antiphospholipid syndrome (HCC) 02/23/2024   Left carotid artery occlusion 09/09/2022   Weakness of both lower extremities 09/07/2022   Bedbound 08/25/2022   PAD (peripheral artery disease) (HCC) 08/25/2022   Arrhythmia as indication for cardiac pacemaker replacement 03/11/2020   Vitamin D  deficiency 09/13/2019   History of intracranial hemorrhage History of severe encephalopathy History of CVA/TIA/progressive leukoencephalopathy and chronic left ICA stenosis 03/12/2019   Right sided weakness 03/12/2019   Anxiety 03/12/2019   PVD (peripheral vascular disease) (HCC) 04/12/2017    Carotid arterial disease (HCC) 04/12/2017   Home Medication(s) Prior to Admission medications   Medication Sig Start Date End Date Taking? Authorizing Provider  acetaminophen  (TYLENOL ) 325 MG tablet Take 2 tablets (650 mg total) by mouth every 6 (six) hours as needed for mild pain, moderate pain, fever or headache (or temp > 37.5 C (99.5 F)). 06/22/23   Hongalgi, Anand D, MD  acidophilus (RISAQUAD) CAPS capsule Take 2 capsules by mouth daily. 04/18/24   Rai, Nydia POUR, MD  apixaban  (ELIQUIS ) 5 MG TABS tablet Take 1 tablet (5 mg total) by mouth 2 (two) times daily. 05/10/24   Ghimire, Donalda HERO, MD  bisacodyl  (DULCOLAX) 10 MG suppository Place 1 suppository (10 mg total) rectally daily as needed for mild constipation. 04/18/24   Rai, Nydia POUR, MD  Cholecalciferol  25 MCG (1000 UT) tablet Take 1,000 Units by mouth every morning.    [provider]  cyanocobalamin  1000 MCG tablet Take 1 tablet (1,000 mcg total) by mouth daily. 09/11/22   Antoinette Doe, MD  feeding supplement (ENSURE ENLIVE / ENSURE PLUS) LIQD Take 237 mLs by mouth 2 (two) times daily between meals. Patient taking differently: Take 237 mLs by mouth 3 (three) times daily between meals. Chocolate if available 03/02/24   Tobie Yetta HERO, MD  gabapentin  (NEURONTIN ) 300 MG capsule TAKE 1 CAPSULE BY MOUTH BY MOUTH TWO TIMES DAILY Patient taking differently: Take 300 mg by mouth 2 (two) times daily. 04/22/22 06/18/24  Skeet Juliene SAUNDERS, DO  ipratropium-albuterol  (DUONEB) 0.5-2.5 (3) MG/3ML SOLN Take 3 mLs by nebulization every 6 (six) hours as needed (SOB, wheezing).  [provider]  ketoconazole (NIZORAL) 2 % shampoo Apply 1 Application topically 2 (two) times a week. Monday and Thursday    [provider]  liver oil-zinc  oxide (DESITIN) 40 % ointment Apply 1 Application topically as needed for irritation (With each incontinent episode daily).    [provider]  magnesium  hydroxide (MILK OF MAGNESIA) 400  MG/5ML suspension Take 30 mLs by mouth daily as needed for mild constipation.    [provider]  nutrition supplement, JUVEN, (JUVEN) PACK Take 1 packet by mouth 2 (two) times daily between meals. 03/09/24   Patsy Lenis, MD  ondansetron  (ZOFRAN ) 4 MG tablet Take 1 tablet (4 mg total) by mouth every 6 (six) hours as needed for nausea. 04/18/24   Rai, Nydia POUR, MD  pantoprazole  (PROTONIX ) 40 MG tablet Take 1 tablet (40 mg total) by mouth at bedtime. 06/22/23   Hongalgi, Anand D, MD  polyethylene glycol (MIRALAX  / GLYCOLAX ) 17 g packet Take 17 g by mouth 2 (two) times daily. 03/09/24   Patsy Lenis, MD  potassium chloride  (KLOR-CON ) 20 MEQ packet Take 40 mEq by mouth 3 (three) times daily. 06/21/24   Raenelle Coria, MD  senna-docusate (SENOKOT-S) 8.6-50 MG tablet Take 1 tablet by mouth at bedtime. 04/18/24   Rai, Nydia POUR, MD  simethicone  (MYLICON) 80 MG chewable tablet Chew 1 tablet (80 mg total) by mouth 4 (four) times daily -  before meals and at bedtime. Patient taking differently: Chew 80 mg by mouth 4 (four) times daily. 03/02/24   Tobie Yetta HERO, MD  sodium phosphate (FLEET) ENEM Place 1 enema rectally every other day.    [provider]  Sodium Phosphates  (FLEET SALINE ENEMA RE) Place 1 application  rectally daily as needed.    [provider]  Wound Cleansers (VASHE WOUND) 0.033 % SOLN Apply 1 application  topically daily. Apple to left ankle topically every day shift for wound care.    [provider]                                                                                                                                    Past Surgical History Past Surgical History:  Procedure Laterality Date   FOOT SURGERY     LOOP RECORDER INSERTION N/A 04/13/2017   Procedure: Loop Recorder Insertion;  Surgeon: Waddell Danelle ORN, MD;  Location: [facility] INVASIVE CV LAB;  Service: Cardiovascular;  Laterality: N/A;   TEE WITHOUT CARDIOVERSION N/A 04/13/2017   Procedure:  TRANSESOPHAGEAL ECHOCARDIOGRAM (TEE);  Surgeon: Jeffrie Oneil BROCKS, MD;  Location: [facility] ENDOSCOPY;  Service: Cardiovascular;  Laterality: N/A;   Family History Family History  Problem Relation Age of Onset   Diabetes Mother    COPD Mother    Diabetes Father    Heart failure Father    Cancer Sister     Social History Social History   Tobacco Use   Smoking status: Every Day  Current packs/day: 1.00    Average packs/day: 1 pack/day for 46.7 years (46.7 ttl pk-yrs)    Types: Cigarettes    Start date: 11/01/1977   Smokeless tobacco: Never  Vaping Use   Vaping status: Never Used  Substance Use Topics   Alcohol use: No   Drug use: No   Allergies Patient has no known allergies.  Review of Systems Review of Systems  Physical Exam Vital Signs  I have reviewed the triage vital signs BP 108/78   Pulse (!) 117   Temp 99.9 F (37.7 C) (Axillary)   Resp 15   Ht 5' 6 (1.676 m)   Wt 59 kg   SpO2 100%   BMI 20.99 kg/m   Physical Exam Vitals and nursing note reviewed.  Constitutional:      Appearance: He is ill-appearing.     Comments: Frail  HENT:     Mouth/Throat:     Mouth: Mucous membranes are dry.  Cardiovascular:     Rate and Rhythm: Tachycardia present.  Abdominal:     General: Abdomen is flat. There is no distension.     Palpations: Abdomen is soft.     Tenderness: There is no abdominal tenderness.  Genitourinary:    Penis: Normal.      Testes: Normal.  Musculoskeletal:        General: No swelling or deformity.     Cervical back: Normal range of motion.  Skin:    General: Skin is warm.  Neurological:     Mental Status: He is alert. Mental status is at baseline.     ED Results and Treatments Labs (all labs ordered are listed, but only abnormal results are displayed) Labs Reviewed  CULTURE, BLOOD (ROUTINE X 2)  CULTURE, BLOOD (ROUTINE X 2)  COMPREHENSIVE METABOLIC PANEL WITH GFR  CBC WITH DIFFERENTIAL/PLATELET  PROTIME-INR  URINALYSIS, W/ REFLEX TO  CULTURE (INFECTION SUSPECTED)  I-STAT CG4 LACTIC ACID, ED                                                                                                                          Radiology No results found.  Pertinent labs & imaging results that were available during my care of the patient were reviewed by me and considered in my medical decision making (see MDM for details).  Medications Ordered in ED Medications  piperacillin -tazobactam (ZOSYN ) IVPB 3.375 g (has no administration in time range)  vancomycin  (VANCOREADY) IVPB 1250 mg/250 mL (has no administration in time range)  Procedures .Critical Care  Performed by: Mannie Fairy DASEN, DO Authorized by: Mannie Fairy DASEN, DO   Critical care provider statement:    Critical care time (minutes):  33   Critical care was necessary to treat or prevent imminent or life-threatening deterioration of the following conditions:  Sepsis   Critical care was time spent personally by me on the following activities:  Development of treatment plan with patient or surrogate, discussions with consultants, evaluation of patient's response to treatment, examination of patient, ordering and review of laboratory studies, ordering and review of radiographic studies, ordering and performing treatments and interventions, pulse oximetry, re-evaluation of patient's condition and review of old charts   (including critical care time)  Medical Decision Making / ED Course   This patient presents to the ED for concern of failure to thrive, this involves an extensive number of treatment options, and is a complaint that carries with it a high risk of complications and morbidity.  The differential diagnosis includes sepsis, dehydration, failure to thrive, underlying infection, electrolyte abnormalities.  MDM: Patient chronically ill.   Patient is currently staying in the palliative care/hospice area, however at this time is not formally a hospice patient, does not have a DNR/DNI.    Given the patient's current condition, likely would benefit from DNR/DNI as most of his problems appear to be related to several previous strokes and are unlikely to improve.  Patient's axillary temp is 99.9.  Have asked for a rectal temp.  I am anticipating that this will be elevated.  His heart rate is 117.  Will start the patient with some fluids, antibiotics for presumed sepsis.    Additional history obtained: -Additional history obtained from EMS -External records from outside source obtained and reviewed including: Chart review including previous notes, labs, imaging, consultation notes   Lab Tests: -I ordered, reviewed, and interpreted labs.   The pertinent results include:   Labs Reviewed  CULTURE, BLOOD (ROUTINE X 2)  CULTURE, BLOOD (ROUTINE X 2)  COMPREHENSIVE METABOLIC PANEL WITH GFR  CBC WITH DIFFERENTIAL/PLATELET  PROTIME-INR  URINALYSIS, W/ REFLEX TO CULTURE (INFECTION SUSPECTED)  I-STAT CG4 LACTIC ACID, ED      Medicines ordered and prescription drug management: Meds ordered this encounter  Medications   piperacillin -tazobactam (ZOSYN ) IVPB 3.375 g    Antibiotic Indication::   Sepsis   vancomycin  (VANCOREADY) IVPB 1250 mg/250 mL    Indication::   Sepsis    -I have reviewed the patients home medicines and have made adjustments as needed  Critical interventions management of sepsis    Cardiac Monitoring: The patient was maintained on a cardiac monitor.  I personally viewed and interpreted the cardiac monitored which showed an underlying rhythm of: Sinus tachycardia  Social Determinants of Health:  Factors impacting patients care include: Medical comorbidities including prior CVAs   Reevaluation: After the interventions noted above, I reevaluated the patient and found that they have :improved  Co  morbidities that complicate the patient evaluation  Past Medical History:  Diagnosis Date   Anxiety    Arrhythmia as indication for cardiac pacemaker replacement 03/2020   Frequent falls 03/2020   History of loop recorder 04/2017   ICH (intracerebral hemorrhage) (HCC) 06/19/2023   Stroke (cerebrum) (HCC)    Tobacco use    Urinary frequency 09/2019   Vitamin D  deficiency        Final Clinical Impression(s) / ED Diagnoses Final diagnoses:  None     @PCDICTATION @    Mannie Fairy T, DO  07/10/24 1628  

## 2024-07-10 NOTE — H&P (Addendum)
 History and Physical    Patient: Daniel Stuart FMW:[id number] DOB: Jun 19, 1966 DOA: 07/10/2024 DOS: the patient was seen and examined on 07/10/2024 PCP: Paseda, Folashade R, [name]  Patient coming from: SNF  Chief Complaint:  Chief Complaint  Patient presents with   Failure To Thrive   HPI: Daniel Stuart is a 58 y.o. male nursing home resident with medical history significant for being bedbound due to previous stroke.  He has contractures in his lower extremities.  The patient was admitted a month ago with another new stroke.  He also has a history of intracranial hemorrhage so he is not a candidate for Coumadin.  He was on Eliquis  prior to this previous stroke and is now on Xarelto.  He is suspected to have antiphospholipid syndrome The patient was seen by palliative care during his last hospital stay.  He remains full code but hospice is following him in the nursing home. Since discharge 3 weeks ago the patient was in his usual state of health until today.  The patient has had decreased p.o. intake and been less responsive than usual. In the emergency department he was found to be tachycardic with heart rates in the 115-130 range.  His temp was 99.9.  Blood pressure initially was 108/78.  Sepsis is suspected.  No source has been found to his workup.  He does have a pressure ulcer on the lateral ankle of his right foot.  He screams in pain whenever that leg is moved.  A plain x-ray of the ankle shows no abnormality. Blood cultures were drawn and the patient was started on Zosyn .  He had a CT of the chest abdomen pelvis also which did not reveal any clear source of sepsis.  He will be admitted to the medical service for further monitoring and work up.    Review of Systems: unable to review all systems due to the inability of the patient to answer questions. Past Medical History:  Diagnosis Date   Anxiety    Arrhythmia as indication for cardiac pacemaker replacement 03/2020   Frequent falls  03/2020   History of loop recorder 04/2017   ICH (intracerebral hemorrhage) (HCC) 06/19/2023   Stroke (cerebrum) (HCC)    Tobacco use    Urinary frequency 09/2019   Vitamin D  deficiency    Past Surgical History:  Procedure Laterality Date   FOOT SURGERY     LOOP RECORDER INSERTION N/A 04/13/2017   Procedure: Loop Recorder Insertion;  Surgeon: Waddell Danelle ORN, MD;  Location: [facility]VASIVE [facility];  Service: Cardiovascular;  Laterality: N/A;   TEE WITHOUT CARDIOVERSION N/A 04/13/2017   Procedure: TRANSESOPHAGEAL ECHOCARDIOGRAM (TEE);  Surgeon: Jeffrie Oneil BROCKS, MD;  Location: [facility] ENDOSCOPY;  Service: Cardiovascular;  Laterality: N/A;   Social History:  reports that he has been smoking cigarettes. He started smoking about 46 years ago. He has a 46.7 pack-year smoking history. He has never used smokeless tobacco. He reports that he does not drink alcohol and does not use drugs.  No Known Allergies  Family History  Problem Relation Age of Onset   Diabetes Mother    COPD Mother    Diabetes Father    Heart failure Father    Cancer Sister     Prior to Admission medications   Medication Sig Start Date End Date Taking? Authorizing Provider  acetaminophen  (TYLENOL ) 325 MG tablet Take 2 tablets (650 mg total) by mouth every 6 (six) hours as needed for mild pain, moderate pain, fever or headache (  or temp > 37.5 C (99.5 F)). 06/22/23  Yes Hongalgi, Trenda BIRCH, MD  apixaban  (ELIQUIS ) 5 MG TABS tablet Take 1 tablet (5 mg total) by mouth 2 (two) times daily. 05/10/24  Yes Ghimire, Donalda HERO, MD  bisacodyl  (DULCOLAX) 10 MG suppository Place 1 suppository (10 mg total) rectally daily as needed for mild constipation. 04/18/24  Yes Rai, Ripudeep K, MD  Cholecalciferol  25 MCG (1000 UT) tablet Take 1,000 Units by mouth every morning.   Yes [provider]  cyanocobalamin  1000 MCG tablet Take 1 tablet (1,000 mcg total) by mouth daily. 09/11/22  Yes Antoinette Doe, MD  feeding supplement (ENSURE ENLIVE /  ENSURE PLUS) LIQD Take 237 mLs by mouth 2 (two) times daily between meals. Patient taking differently: Take 237 mLs by mouth 3 (three) times daily between meals. Chocolate if available 03/02/24  Yes Tobie Yetta HERO, MD  gabapentin  (NEURONTIN ) 300 MG capsule TAKE 1 CAPSULE BY MOUTH BY MOUTH TWO TIMES DAILY Patient taking differently: Take 300 mg by mouth 2 (two) times daily. 04/22/22 07/10/24 Yes Jaffe, Adam R, DO  ipratropium-albuterol  (DUONEB) 0.5-2.5 (3) MG/3ML SOLN Take 3 mLs by nebulization every 6 (six) hours as needed (SOB, wheezing).   Yes [provider]  ketoconazole (NIZORAL) 2 % shampoo Apply 1 Application topically 2 (two) times a week. Monday and Thursday   Yes [provider]  Lactobacillus (ACIDOPHILUS) 100 MG CAPS Take 200 mg by mouth in the morning.   Yes [provider]  liver oil-zinc  oxide (DESITIN) 40 % ointment Apply 1 Application topically as needed for irritation (With each incontinent episode daily).   Yes [provider]  magnesium  hydroxide (MILK OF MAGNESIA) 400 MG/5ML suspension Take 30 mLs by mouth daily as needed for mild constipation.   Yes [provider]  nutrition supplement, JUVEN, (JUVEN) PACK Take 1 packet by mouth 2 (two) times daily between meals. 03/09/24  Yes Patsy Lenis, MD  ondansetron  (ZOFRAN ) 4 MG tablet Take 1 tablet (4 mg total) by mouth every 6 (six) hours as needed for nausea. 04/18/24  Yes Rai, Ripudeep K, MD  pantoprazole  (PROTONIX ) 40 MG tablet Take 1 tablet (40 mg total) by mouth at bedtime. 06/22/23  Yes Hongalgi, Anand D, MD  polyethylene glycol (MIRALAX  / GLYCOLAX ) 17 g packet Take 17 g by mouth 2 (two) times daily. 03/09/24  Yes Patsy Lenis, MD  potassium chloride  (KLOR-CON ) 20 MEQ packet Take 40 mEq by mouth 3 (three) times daily. Patient taking differently: Take 40 mEq by mouth every 8 (eight) hours. 06/21/24  Yes Ghimire, Renato, MD  senna-docusate (SENOKOT-S) 8.6-50 MG tablet Take 1 tablet by mouth at  bedtime. 04/18/24  Yes Rai, Ripudeep K, MD  simethicone  (MYLICON) 80 MG chewable tablet Chew 1 tablet (80 mg total) by mouth 4 (four) times daily -  before meals and at bedtime. Patient taking differently: Chew 80 mg by mouth 4 (four) times daily. 03/02/24  Yes Tobie Yetta HERO, MD  sodium bicarbonate  650 MG tablet Take 650 mg by mouth 2 (two) times daily.   Yes [provider]  sodium phosphate (FLEET) ENEM Place 1 enema rectally every other day.   Yes [provider]  Sodium Phosphates  (FLEET SALINE ENEMA RE) Place 1 application  rectally daily as needed.   Yes [provider]    Physical Exam: Vitals:   07/10/24 1357 07/10/24 1401 07/10/24 1700  BP:  108/78 114/84  Pulse:  (!) 117 (!) 124  Resp:  15 16  Temp:  99.9 F (37.7 C) (!) 101.2 F (38.4 C)  TempSrc:  Axillary Axillary  SpO2:  100% 100%  Weight: 59 kg    Height: 5' 6 (1.676 m)     Physical Exam:  General: No acute distress,chronically ill appearing HEENT: Normocephalic, atraumatic, looks in space, difficulty with eye contact Cardiovascular: Normal rhythm. Tachycardic, Distal pulses intact. Pulmonary: Normal pulmonary effort, normal breath sounds Gastrointestinal: Very distended abdomen, non-tender, hypoactive bowel sounds Musculoskeletal:No lower ext edema. Contractures both knees Lymphadenopathy: No cervical LAD. Skin: Skin is warm and dry. Could not examine his heel or backside because of screaming. Neuro: He follow simple commands and will say a word here or there.  When I asked if he was okay he replied no PSYCH: awake, mostly non verbal.  Data Reviewed:  Results for orders placed or performed during the hospital encounter of 07/10/24 (from the past 24 hours)  Urinalysis, w/ Reflex to Culture (Infection Suspected) -Urine, Catheterized     Status: Abnormal   Collection Time: 07/10/24  2:07 PM  Result Value Ref Range   Specimen Source URINE, CATHETERIZED    Color, Urine YELLOW YELLOW    APPearance CLEAR CLEAR   Specific Gravity, Urine 1.011 1.005 - 1.030   pH 5.0 5.0 - 8.0   Glucose, UA NEGATIVE NEGATIVE mg/dL   Hgb urine dipstick NEGATIVE NEGATIVE   Bilirubin Urine NEGATIVE NEGATIVE   Ketones, ur NEGATIVE NEGATIVE mg/dL   Protein, ur NEGATIVE NEGATIVE mg/dL   Nitrite NEGATIVE NEGATIVE   Leukocytes,Ua NEGATIVE NEGATIVE   RBC / HPF 0-5 0 - 5 RBC/hpf   WBC, UA 0-5 0 - 5 WBC/hpf   Bacteria, UA RARE (A) NONE SEEN   Squamous Epithelial / HPF 0-5 0 - 5 /HPF   Mucus PRESENT   Comprehensive metabolic panel     Status: Abnormal   Collection Time: 07/10/24  3:39 PM  Result Value Ref Range   Sodium 132 (L) 135 - 145 mmol/L   Potassium 5.4 (H) 3.5 - 5.1 mmol/L   Chloride 98 98 - 111 mmol/L   CO2 22 22 - 32 mmol/L   Glucose, Bld 96 70 - 99 mg/dL   BUN 16 6 - 20 mg/dL   Creatinine, Ser 8.88 0.61 - 1.24 mg/dL   Calcium  9.0 8.9 - 10.3 mg/dL   Total Protein 7.7 6.5 - 8.1 g/dL   Albumin 2.3 (L) 3.5 - 5.0 g/dL   AST 52 (H) 15 - 41 U/L   ALT 37 0 - 44 U/L   Alkaline Phosphatase 97 38 - 126 U/L   Total Bilirubin 0.5 0.0 - 1.2 mg/dL   GFR, Estimated >39 >39 mL/min   Anion gap 12 5 - 15  Protime-INR     Status: Abnormal   Collection Time: 07/10/24  3:39 PM  Result Value Ref Range   Prothrombin Time 25.2 (H) 11.4 - 15.2 seconds   INR 2.2 (H) 0.8 - 1.2  I-Stat Lactic Acid, ED     Status: Abnormal   Collection Time: 07/10/24  4:10 PM  Result Value Ref Range   Lactic Acid, Venous 2.5 (HH) 0.5 - 1.9 mmol/L   Comment NOTIFIED PHYSICIAN   CBC with Differential/Platelet     Status: Abnormal   Collection Time: 07/10/24  8:07 PM  Result Value Ref Range   WBC 14.4 (H) 4.0 - 10.5 K/uL   RBC 4.97 4.22 - 5.81 MIL/uL   Hemoglobin 13.5 13.0 - 17.0 g/dL   HCT 57.6 60.9 - 47.9 %  MCV 85.1 80.0 - 100.0 fL   MCH 27.2 26.0 - 34.0 pg   MCHC 31.9 30.0 - 36.0 g/dL   RDW 85.9 88.4 - 84.4 %   Platelets 449 (H) 150 - 400 K/uL   nRBC 0.0 0.0 - 0.2 %   Neutrophils Relative % 82 %   Neutro  Abs 11.7 (H) 1.7 - 7.7 K/uL   Lymphocytes Relative 11 %   Lymphs Abs 1.6 0.7 - 4.0 K/uL   Monocytes Relative 6 %   Monocytes Absolute 0.9 0.1 - 1.0 K/uL   Eosinophils Relative 0 %   Eosinophils Absolute 0.0 0.0 - 0.5 K/uL   Basophils Relative 0 %   Basophils Absolute 0.0 0.0 - 0.1 K/uL   Immature Granulocytes 1 %   Abs Immature Granulocytes 0.08 (H) 0.00 - 0.07 K/uL   IMPRESSION: 1. No acute intrathoracic pathology. 2. Nonocclusive thrombus extending from the left external iliac vein into the lower IVC. 3. A 5 mm proximal left ureteral stone. No hydronephrosis. 4. Small nonobstructing bilateral renal calculi. 5. Faint area of ill-defined density in the soft tissues posterior to the left ischial tuberosity may represent an area of edema. No drainable fluid collection at this time. 6. Aortic Atherosclerosis (ICD10-I70.0) and Emphysema (ICD10-J43.9).    Assessment and Plan: Sepsis of unclear etiology -  UA is unremarkable. He has a ureteral stone but it is not causing obstruction and UA is normal.   CT of the chest reveals no evidence of infection.  CT of abdomen pelvis reveals a nonocclusive DVT in the external iliac vein first identified 06/2024, but no evidence of infection. There is ill-defined edema over the left ischial tuberosity. - Blood cultures have been drawn - Will continue Zosyn  and monitor - The patient was already on Xarelto for history of DVT but has this large new DVT so he has been placed on IV heparin . - He will require sedation to examine his heel and backside.  His ankle x-ray did not reveal significant swelling or injury. Mild edema of the ischium may be a budding decubitus.  Monitor  2. Nonobstructing renal stones -urology recommends monitoring.  3.  Lower IVC DVT - the clot occurred while he was on Eliquis .  He was switched to Xarelto.  Coumadin is felt to be too high risk because of his history of intracranial hemorrhage.  Lovenox  may be better protection. -  Consider hematology consult.  - The patient has been started on IV heparin  in the emergency department.   Advance Care Planning:   Code Status: Full Code per the records in the chart. He is full code.  Consults: none  Family Communication: none  Severity of Illness: The appropriate patient status for this patient is INPATIENT. Inpatient status is judged to be reasonable and necessary in order to provide the required intensity of service to ensure the patient's safety. The patient's presenting symptoms, physical exam findings, and initial radiographic and laboratory data in the context of their chronic comorbidities is felt to place them at high risk for further clinical deterioration. Furthermore, it is not anticipated that the patient will be medically stable for discharge from the hospital within 2 midnights of admission.   * I certify that at the point of admission it is my clinical judgment that the patient will require inpatient hospital care spanning beyond 2 midnights from the point of admission due to high intensity of service, high risk for further deterioration and high frequency of surveillance required.*  AuthorBETHA BERNARD,  Loron Weimer, MD 07/10/2024 8:42 PM  For on call review [email or web address].

## 2024-07-10 NOTE — Progress Notes (Incomplete)
 PHARMACY - ANTICOAGULATION CONSULT NOTE  Pharmacy Consult for Heparin  Indication: DVT  No Known Allergies  Patient Measurements: Height: 5' 6 (167.6 cm) Weight: 59 kg (130 lb 1.1 oz) IBW/kg (Calculated) : 63.8 HEPARIN  DW (KG): 59  Vital Signs: Temp: 101.2 F (38.4 C) (09/09 1700) Temp Source: Axillary (09/09 1700) BP: 114/84 (09/09 1700) Pulse Rate: 124 (09/09 1700)  Labs: Recent Labs    07/10/24 1539 07/10/24 2007  HGB  --  13.5  HCT  --  42.3  PLT  --  449*  LABPROT 25.2*  --   INR 2.2*  --   CREATININE 1.11  --     Estimated Creatinine Clearance: 59.1 mL/min (by C-G formula based on SCr of 1.11 mg/dL).   Medical History: Past Medical History:  Diagnosis Date   Anxiety    Arrhythmia as indication for cardiac pacemaker replacement 03/2020   Frequent falls 03/2020   History of loop recorder 04/2017   ICH (intracerebral hemorrhage) (HCC) 06/19/2023   Stroke (cerebrum) (HCC)    Tobacco use    Urinary frequency 09/2019   Vitamin D  deficiency     Assessment: Palliative pt with terminal chronic cerebrovascular disease. According to dispense record a 14 day supply was filled on 06/21/24. Will order an aPTT with first blood draw.  Goal of Therapy:  Heparin  level 0.3-0.7 units/ml Monitor platelets by anticoagulation protocol: Yes   Plan:  Start heparin  infusion at 950 units/hr Check anti-Xa level in 6 hours and daily while on heparin  Continue to monitor H&H and platelets  Prentice DOROTHA Favors, PharmD PGY1 Health-System Pharmacy Administration and Leadership Resident [facility]  07/10/2024 8:32 PM

## 2024-07-10 NOTE — ED Triage Notes (Signed)
 Pt BIB EMS for FTT. Per staff pt has had decreased PO intake and general decline in health. Pt denies pain. Does not answer all questions

## 2024-07-10 NOTE — Progress Notes (Signed)
 [facility] Liaison Note:  This patient is a current patient with [facility], admitted on 8.25.2025 with a terminal diagnosis of cerebrovascular disease. [name] Mote (sister) is pts PCG. Her number is [phone].   We will continue to follow for any discharge planning needs and to coordinate continuation of hospice care.  Please don't hesitate to call with any hospice related questions or concerns. Thank you for the opportunity to participate in this patient's care.   Greig Basket BSN, RN [facility] Liaison  [phone]

## 2024-07-10 NOTE — ED Provider Notes (Signed)
 Care of patient assumed from Dr. Mannie.  Patient presenting for general decline in health and decreased p.o. intake.  He resides in a nursing facility.  He is bedbound at baseline.  Vital signs today concerning for sepsis.  IV fluids and antibiotics have been ordered.  Workup is pending. Physical Exam  BP 108/78   Pulse (!) 117   Temp 99.9 F (37.7 C) (Axillary)   Resp 15   Ht 5' 6 (1.676 m)   Wt 59 kg   SpO2 100%   BMI 20.99 kg/m   Physical Exam Vitals and nursing note reviewed.  Constitutional:      General: He is not in acute distress.    Appearance: He is well-developed. He is ill-appearing. He is not toxic-appearing or diaphoretic.  HENT:     Head: Normocephalic and atraumatic.     Right Ear: External ear normal.     Left Ear: External ear normal.     Nose: Nose normal.     Mouth/Throat:     Mouth: Mucous membranes are moist.  Eyes:     Extraocular Movements: Extraocular movements intact.     Conjunctiva/sclera: Conjunctivae normal.  Cardiovascular:     Rate and Rhythm: Normal rate and regular rhythm.  Pulmonary:     Effort: Pulmonary effort is normal. No respiratory distress.  Abdominal:     General: There is distension.     Palpations: Abdomen is soft.     Tenderness: There is no abdominal tenderness.  Musculoskeletal:        General: No swelling.     Cervical back: Normal range of motion and neck supple.  Skin:    General: Skin is warm and dry.     Capillary Refill: Capillary refill takes less than 2 seconds.     Coloration: Skin is not jaundiced or pale.  Neurological:     Mental Status: He is alert. Mental status is at baseline.  Psychiatric:        Mood and Affect: Mood normal.        Behavior: Behavior normal.     Procedures  Procedures  ED Course / MDM    Medical Decision Making Amount and/or Complexity of Data Reviewed Labs: ordered. Radiology: ordered.  Risk OTC drugs. Prescription drug management. Decision regarding  hospitalization.   On assessment, patient resting on ED stretcher.  He remains tachycardic.  Temperature is now 101.2 degrees.  Tylenol  was ordered.  His niece is present at bedside.  Niece feels like he is at his mental baseline, although he has had a decline since his hospitalization 2 weeks ago.  His urine showed only rare bacteriuria.  His x-ray was negative.  On exam, he does have a distended abdomen which niece feels like is probably from his chronic ileus.  He has a pressure wound on lateral malleolus of right foot.  When moving his right leg, he does cry out in pain.  He is unable to verbalize where the pain is.  Ankle wound has no significant erythema or drainage.  He does not appear to have any tenderness anywhere else on his leg.  X-ray imaging of right ankle and knee were negative.  His niece is concerned of possibly another stroke given that he seems to not make eye contact with her like he normally would.  This may be secondary to his fever and sepsis, however, will obtain CT imaging.  CT imaging showed nonocclusive thrombus extending from left external iliac vein into lower IVC.  This has been identified before on prior imaging.  Heparin  was ordered.  It does raise concern for possible pylephlebitis as source of infection.  Patient also has a 5 mm proximal left ureteral stone.  There does not appear to be any upstream hydronephrosis.  Urine culture was ordered.  I did speak with urologist on-call, Dr. Alvaro, who is not concerned of obstructive uropathy.  He advised for hospitalist to reach out if any further concerns.  Patient was admitted for further management.       Melvenia Motto, MD 07/10/24 [phone]

## 2024-07-11 ENCOUNTER — Encounter (HOSPITAL_COMMUNITY): Payer: Self-pay | Admitting: Internal Medicine

## 2024-07-11 DIAGNOSIS — A419 Sepsis, unspecified organism: Secondary | ICD-10-CM | POA: Diagnosis not present

## 2024-07-11 LAB — HEPARIN LEVEL (UNFRACTIONATED)
Heparin Unfractionated: >1.1 [IU]/mL — ABNORMAL HIGH (ref 0.30–0.70)
Heparin Unfractionated: >1.1 [IU]/mL — ABNORMAL HIGH (ref 0.30–0.70)

## 2024-07-11 LAB — BASIC METABOLIC PANEL WITH GFR
Anion gap: 12 (ref 5–15)
BUN: 11 mg/dL (ref 6–20)
CO2: 21 mmol/L — ABNORMAL LOW (ref 22–32)
Calcium: 8.3 mg/dL — ABNORMAL LOW (ref 8.9–10.3)
Chloride: 101 mmol/L (ref 98–111)
Creatinine, Ser: 0.79 mg/dL (ref 0.61–1.24)
GFR, Estimated: >60 mL/min (ref >60)
Glucose, Bld: 108 mg/dL — ABNORMAL HIGH (ref 70–99)
Potassium: 3.6 mmol/L (ref 3.5–5.1)
Sodium: 134 mmol/L — ABNORMAL LOW (ref 135–145)

## 2024-07-11 LAB — APTT
aPTT: 60 s — ABNORMAL HIGH (ref 24–36)
aPTT: 59 s — ABNORMAL HIGH (ref 24–36)

## 2024-07-11 LAB — CBC
HCT: 34.7 % — ABNORMAL LOW (ref 39.0–52.0)
Hemoglobin: 11.5 g/dL — ABNORMAL LOW (ref 13.0–17.0)
MCH: 27.4 pg (ref 26.0–34.0)
MCHC: 33.1 g/dL (ref 30.0–36.0)
MCV: 82.6 fL (ref 80.0–100.0)
Platelets: 422 K/uL — ABNORMAL HIGH (ref 150–400)
RBC: 4.2 MIL/uL — ABNORMAL LOW (ref 4.22–5.81)
RDW: 13.9 % (ref 11.5–15.5)
WBC: 12.7 K/uL — ABNORMAL HIGH (ref 4.0–10.5)
nRBC: 0 % (ref 0.0–0.2)

## 2024-07-11 MED ORDER — PIPERACILLIN-TAZOBACTAM 3.375 G IVPB
3.3750 g | Freq: Three times a day (TID) | INTRAVENOUS | Status: DC
  Administered 2024-07-11 – 2024-07-14 (×9): 3.375 g via INTRAVENOUS
  Filled 2024-07-11 (×11): qty 50

## 2024-07-11 NOTE — Progress Notes (Signed)
 PHARMACY - ANTICOAGULATION CONSULT NOTE  Pharmacy Consult for Heparin  Indication: DVT  No Known Allergies  Patient Measurements: Height: 5' 6 (167.6 cm) Weight: 59 kg (130 lb 1.1 oz) IBW/kg (Calculated) : 63.8 HEPARIN  DW (KG): 59  Vital Signs: Temp: 98.6 F (37 C) (09/10 0530) Temp Source: Oral (09/10 0530) BP: 109/80 (09/10 0530) Pulse Rate: 101 (09/10 0530)  Labs: Recent Labs    07/10/24 1539 07/10/24 2007 07/11/24 0441  HGB  --  13.5 11.5*  HCT  --  42.3 34.7*  PLT  --  449* 422*  APTT  --   --  60*  LABPROT 25.2*  --   --   INR 2.2*  --   --   HEPARINUNFRC  --   --  >1.10*  CREATININE 1.11  --  0.79    Estimated Creatinine Clearance: 81.9 mL/min (by C-G formula based on SCr of 0.79 mg/dL).   Medical History: Past Medical History:  Diagnosis Date   Anxiety    Arrhythmia as indication for cardiac pacemaker replacement 03/2020   Frequent falls 03/2020   History of loop recorder 04/2017   ICH (intracerebral hemorrhage) (HCC) 06/19/2023   Stroke (cerebrum) (HCC)    Tobacco use    Urinary frequency 09/2019   Vitamin D  deficiency     Assessment: Palliative pt with terminal chronic cerebrovascular disease. Pt on PTA apixaban  and last dose on 9/9 at 8 AM. Will utilize heparin  for now and consider restarting apixaban  when stable.  9/10 AM update:  aPTT sub-therapeutic   Goal of Therapy:  Heparin  level 0.3-0.7 units/ml Monitor platelets by anticoagulation protocol: Yes   Plan:  Inc heparin  to 1050 units/hr Heparin  level and aPTT in 8 hours  Lynwood Mckusick, PharmD, BCPS Clinical Pharmacist Phone: [phone]

## 2024-07-11 NOTE — ED Notes (Signed)
 Transport called x3

## 2024-07-11 NOTE — ED Notes (Signed)
 Labs attempted by RN and phleb x3

## 2024-07-11 NOTE — Plan of Care (Signed)
  Problem: Clinical Measurements: Goal: Will remain free from infection Outcome: Progressing Goal: Diagnostic test results will improve Outcome: Progressing Goal: Respiratory complications will improve Outcome: Progressing Goal: Cardiovascular complication will be avoided Outcome: Progressing   Problem: Activity: Goal: Risk for activity intolerance will decrease Outcome: Progressing   Problem: Nutrition: Goal: Adequate nutrition will be maintained Outcome: Progressing   Problem: Coping: Goal: Level of anxiety will decrease Outcome: Progressing   Problem: Elimination: Goal: Will not experience complications related to bowel motility Outcome: Progressing Goal: Will not experience complications related to urinary retention Outcome: Progressing   Problem: Pain Managment: Goal: General experience of comfort will improve and/or be controlled Outcome: Progressing   Problem: Safety: Goal: Ability to remain free from injury will improve Outcome: Progressing   Problem: Skin Integrity: Goal: Risk for impaired skin integrity will decrease Outcome: Progressing   Problem: Education: Goal: Knowledge of General Education information will improve Description: Including pain rating scale, medication(s)/side effects and non-pharmacologic comfort measures Outcome: Not Progressing   Problem: Health Behavior/Discharge Planning: Goal: Ability to manage health-related needs will improve Outcome: Not Progressing   Problem: Clinical Measurements: Goal: Ability to maintain clinical measurements within normal limits will improve Outcome: Not Progressing

## 2024-07-11 NOTE — Progress Notes (Signed)
 PHARMACY - ANTICOAGULATION CONSULT NOTE  Pharmacy Consult for Heparin  Indication: DVT  No Known Allergies  Patient Measurements: Height: 5' 6 (167.6 cm) Weight: 59 kg (130 lb 1.1 oz) IBW/kg (Calculated) : 63.8 HEPARIN  DW (KG): 59  Vital Signs: Temp: 98.7 F (37.1 C) (09/10 1614) Temp Source: Oral (09/10 1614) BP: 106/90 (09/10 1614) Pulse Rate: 109 (09/10 1614)  Labs: Recent Labs    07/10/24 1539 07/10/24 2007 07/11/24 0441 07/11/24 1523  HGB  --  13.5 11.5*  --   HCT  --  42.3 34.7*  --   PLT  --  449* 422*  --   APTT  --   --  60* 59*  LABPROT 25.2*  --   --   --   INR 2.2*  --   --   --   HEPARINUNFRC  --   --  >1.10* >1.10*  CREATININE 1.11  --  0.79  --     Estimated Creatinine Clearance: 81.9 mL/min (by C-G formula based on SCr of 0.79 mg/dL).   Medical History: Past Medical History:  Diagnosis Date   Anxiety    Arrhythmia as indication for cardiac pacemaker replacement 03/2020   Frequent falls 03/2020   History of loop recorder 04/2017   ICH (intracerebral hemorrhage) (HCC) 06/19/2023   Stroke (cerebrum) (HCC)    Tobacco use    Urinary frequency 09/2019   Vitamin D  deficiency     Assessment: Palliative pt with terminal chronic cerebrovascular disease. Pt on PTA apixaban  and last dose on 9/9 at 8 AM. Will utilize heparin  for now and consider restarting apixaban  when stable.  S/p rate increase to 1050 units/hr aPTT slightly subtherapeutic, anti-Xa level elevated as expected with last apixaban  dose  Goal of Therapy:  Heparin  level 0.3-0.7 units/ml aPTT 66-102 seconds Monitor platelets by anticoagulation protocol: Yes   Plan:  Increase heparin  gtt to 1250 units/hr F/u 6 hour aPTT/HL F/u long term [facility] plan  Dorn Poot, PharmD, [facility] Clinical Pharmacist ED Pharmacist Phone # [phone] 07/11/2024 4:28 PM

## 2024-07-11 NOTE — Progress Notes (Addendum)
 [facility] 7T81 [facility] Patient   [name] is a current [facility] patient with a terminal diagnosis of cerebrovascular disease who resides at [facility]. Patient was recently hospitalized with treatment for pseudo ileus and since return to the facility has not been doing well. He was found to have low BP and high heart rate and family made choice for him to be evaluated. He has chosen to remain a full code. He was admitted on 9.9.25 with diagnosis of Sepsis and per Dr. Norleen Laurence this is a related hospital admission.   Visited patient while still in ED hallway awaiting bed placement. His only c/o was that of feeling weak. He continues to verbalize his desire to get better. Infection source is unclear at this point and blood cultures are pending while he is being treated with empiric antibiotics.   Patient is inpatient appropriate with need for labs/diagnostics and IVAB.  Vital Signs- 98.5/109/15   103/73   sp02 100% on room air I&O- 2510/not recorded Abnormal Labs-  Na+ 132, K+ 5.4, Albumin 2.3, Lactic 2.5 Diagnostics-  Head and Chest CT without acute findings, Chest/Knee/Ankle x-rays all without acute findings IV/PRN Meds- Fentanyl  50mcg IV x1, Heparin  12.5ml/H continuous IV, LR 1L IV x2, Zosyn  3.375g IV q8H, Vancomycin  1250mg  IV x1  Current plan as per PN Dr. Darcel Dawley 9.10.15  Sepsis of unclear etiology : UA is unremarkable. He has a ureteral stone but it is not causing obstruction and UA is normal.   CT of the chest reveals no evidence of infection.   CT of abdomen pelvis reveals a nonocclusive DVT in the external iliac vein first identified in 06/2024, but no evidence of infection. There is ill-defined edema over the left ischial tuberosity. Blood cultures have been drawn. Initiated on empiric IV Zosyn . The patient was already on Xarelto for history of DVT but has this large new DVT so he has been placed on IV heparin . He will require sedation to  examine his heel and backside.   His ankle x-ray did not reveal significant swelling or injury. Mild edema of the ischium may be a budding decubitus.   Monitor for signs of infection.   Non obstructing renal stones: Urology recommended monitoring.   Lower IVC DVT : the clot occurred while he was on Eliquis .  He was switched to Xarelto.   Coumadin is felt to be too high risk because of his history of intracranial hemorrhage.   Lovenox  may be better protection. Consider hematology consult.  The patient has been started on IV heparin  in the emergency department.  Discharge Planning- ongoing, likely once medically optimized  Family Contact- none today, spoke with patient at bedside IDT: updated Goals of Care: Full Transfer summary and Med list sent to be placed under Media tab. Thank you for the opportunity to participate in this patient's care, please don't hesitate to call for any hospice related questions or concerns.  When patient is ready for discharge please use [facility] as we contract this service for our patients.  Hunter Seip BSN, [facility] [phone]

## 2024-07-11 NOTE — Progress Notes (Signed)
 PROGRESS NOTE    HAZEN BRUMETT  FMW:[id number] DOB: 13-Dec-1965 DOA: 07/10/2024 PCP: Paseda, Folashade R, [name]   Brief Narrative:  This 58 yrs old Male nursing home resident with medical history significant for being bedbound due to previous stroke.  He has contractures in his lower extremities.  The patient was admitted a month ago with another new stroke.  He also has a history of intracranial hemorrhage so he is not a candidate for Coumadin.  He was on Eliquis  prior to this previous stroke and is now on Xarelto.  He is suspected to have antiphospholipid syndrome.  Patient was found to be less responsive and has decreased oral intake today.  He was found to be tachycardic in the ED, blood pressure noted well 108/78.  Sepsis is suspected so far no source has been found on his workup.  He does have a pressure ulcer on his lateral ankle of right foot.  He screams when his leg is moved.  Plain x-ray of ankle shows no abnormality.  Patient was admitted for sepsis due to unknown source.  Started on empiric antibiotics.  Assessment & Plan:   Principal Problem:   Sepsis (HCC) Active Problems:   PVD (peripheral vascular disease) (HCC)   Bedbound   PAD (peripheral artery disease) (HCC)   Acute stroke due to ischemia (HCC)   Sepsis of unclear etiology : UA is unremarkable. He has a ureteral stone but it is not causing obstruction and UA is normal.   CT of the chest reveals no evidence of infection.   CT of abdomen pelvis reveals a nonocclusive DVT in the external iliac vein first identified in 06/2024, but no evidence of infection. There is ill-defined edema over the left ischial tuberosity. Blood cultures have been drawn. Initiated on empiric IV Zosyn . The patient was already on Xarelto for history of DVT but has this large new DVT so he has been placed on IV heparin . He will require sedation to examine his heel and backside.   His ankle x-ray did not reveal significant swelling or injury. Mild  edema of the ischium may be a budding decubitus.   Monitor for signs of infection.   Non obstructing renal stones: Urology recommended monitoring.   Lower IVC DVT : the clot occurred while he was on Eliquis .  He was switched to Xarelto.   Coumadin is felt to be too high risk because of his history of intracranial hemorrhage.   Lovenox  may be better protection. Consider hematology consult.  The patient has been started on IV heparin  in the emergency department.   DVT prophylaxis: IV Heparin  Code Status: Full code Family Communication: No family at bed side Disposition Plan:    Status is: Inpatient Remains inpatient appropriate because: Severity of illness   Consultants:  None  Procedures: CT A/P  Antimicrobials:  Anti-infectives (From admission, onward)    Start     Dose/Rate Route Frequency Ordered Stop   07/10/24 1415  piperacillin -tazobactam (ZOSYN ) IVPB 3.375 g        3.375 g 100 mL/hr over 30 Minutes Intravenous  Once 07/10/24 1407 07/10/24 1658   07/10/24 1415  vancomycin  (VANCOREADY) IVPB 1250 mg/250 mL        1,250 mg 166.7 mL/hr over 90 Minutes Intravenous  Once 07/10/24 1411 07/10/24 2013       Subjective: Patient was seen and examined at bedside.  Overnight events noted. Patient reports feeling weak and tired,  appears deconditioned, has contractures in the lower extremity.  Objective: Vitals:   07/11/24 0800 07/11/24 1123 07/11/24 1455 07/11/24 1503  BP: 111/83 103/73 122/86   Pulse: 93 90 98   Resp: 13 15 16    Temp:  98 F (36.7 C)  99.2 F (37.3 C)  TempSrc:  Oral  Oral  SpO2: 100% 100% 95%   Weight:      Height:        Intake/Output Summary (Last 24 hours) at 07/11/2024 1544 Last data filed at 07/11/2024 0344 Gross per 24 hour  Intake 2362.27 ml  Output --  Net 2362.27 ml   Filed Weights   07/10/24 1357  Weight: 59 kg    Examination:  General exam: Appears calm and comfortable, deconditioned, not in any acute distress. Respiratory  system: Clear to auscultation. Respiratory effort normal.  RR 14 Cardiovascular system: S1 & S2 heard, RRR. No JVD, murmurs, rubs, gallops or clicks.  Gastrointestinal system: Abdomen is non distended, soft and non tender. Normal bowel sounds heard. Central nervous system: Alert and oriented x 3. No focal neurological deficits. Extremities: Contractures in the lower extremities. Skin: No rashes, lesions or ulcers Psychiatry: Judgement and insight appear normal. Mood & affect appropriate.     Data Reviewed: I have personally reviewed following labs and imaging studies  CBC: Recent Labs  Lab 07/10/24 2007 07/11/24 0441  WBC 14.4* 12.7*  NEUTROABS 11.7*  --   HGB 13.5 11.5*  HCT 42.3 34.7*  MCV 85.1 82.6  PLT 449* 422*   Basic Metabolic Panel: Recent Labs  Lab 07/10/24 1539 07/11/24 0441  NA 132* 134*  K 5.4* 3.6  CL 98 101  CO2 22 21*  GLUCOSE 96 108*  BUN 16 11  CREATININE 1.11 0.79  CALCIUM  9.0 8.3*   GFR: Estimated Creatinine Clearance: 81.9 mL/min (by C-G formula based on SCr of 0.79 mg/dL). Liver Function Tests: Recent Labs  Lab 07/10/24 1539  AST 52*  ALT 37  ALKPHOS 97  BILITOT 0.5  PROT 7.7  ALBUMIN 2.3*   No results for input(s): LIPASE, AMYLASE in the last 168 hours. No results for input(s): AMMONIA in the last 168 hours. Coagulation Profile: Recent Labs  Lab 07/10/24 1539  INR 2.2*   Cardiac Enzymes: No results for input(s): CKTOTAL, CKMB, CKMBINDEX, TROPONINI in the last 168 hours. BNP (last 3 results) No results for input(s): PROBNP in the last 8760 hours. HbA1C: No results for input(s): HGBA1C in the last 72 hours. CBG: No results for input(s): GLUCAP in the last 168 hours. Lipid Profile: No results for input(s): CHOL, HDL, LDLCALC, TRIG, CHOLHDL, LDLDIRECT in the last 72 hours. Thyroid  Function Tests: No results for input(s): TSH, T4TOTAL, FREET4, T3FREE, THYROIDAB in the last 72  hours. Anemia Panel: No results for input(s): VITAMINB12, FOLATE, FERRITIN, TIBC, IRON, RETICCTPCT in the last 72 hours. Sepsis Labs: Recent Labs  Lab 07/10/24 1610  LATICACIDVEN 2.5*    Recent Results (from the past 240 hours)  Blood Culture (routine x 2)     Status: None (Preliminary result)   Collection Time: 07/10/24  3:39 PM   Specimen: BLOOD LEFT FOREARM  Result Value Ref Range Status   Specimen Description BLOOD LEFT FOREARM  Final   Special Requests   Final    BOTTLES DRAWN AEROBIC AND ANAEROBIC Blood Culture results may not be optimal due to an inadequate volume of blood received in culture bottles   Culture   Final    NO GROWTH < 24 HOURS Performed at [facility], [address] [place], [place] [address]    Report Status PENDING  Incomplete    Radiology Studies: DG Knee 2 Views Right Result Date: 07/10/2024 CLINICAL DATA:  Sepsis EXAM: RIGHT KNEE - 1-2 VIEW COMPARISON:  None Available. FINDINGS: Limited study with the knee imaged in a flexed position. Patient was unable to straighten leg. No visible fracture, subluxation or dislocation. No visible joint effusion. IMPRESSION: No visible acute bony abnormality. Electronically Signed   By: Franky Crease M.D.   On: 07/10/2024 20:12   DG Ankle 2 Views Right Result Date: 07/10/2024 CLINICAL DATA:  Sepsis EXAM: RIGHT ANKLE - 2 VIEW COMPARISON:  None Available. FINDINGS: There is no evidence of fracture, dislocation, or joint effusion. There is no evidence of arthropathy or other focal bone abnormality. Soft tissues are unremarkable. IMPRESSION: Negative. Electronically Signed   By: Franky Crease M.D.   On: 07/10/2024 20:11   CT CHEST ABDOMEN PELVIS W CONTRAST Result Date: 07/10/2024 CLINICAL DATA:  Sepsis. EXAM: CT CHEST, ABDOMEN, AND PELVIS WITH CONTRAST TECHNIQUE: Multidetector CT imaging of the chest, abdomen and pelvis was performed following the standard protocol during bolus administration of intravenous  contrast. RADIATION DOSE REDUCTION: This exam was performed according to the departmental dose-optimization program which includes automated exposure control, adjustment of the mA and/or kV according to patient size and/or use of iterative reconstruction technique. CONTRAST:  75mL OMNIPAQUE  IOHEXOL  350 MG/ML SOLN COMPARISON:  CT abdomen pelvis dated 06/18/2024. FINDINGS: Evaluation of this exam is limited due to respiratory motion as well as streak artifact caused by patient's arms. CT CHEST FINDINGS Cardiovascular: There is no cardiomegaly or pericardial effusion. Mild atherosclerotic calcification of the thoracic aorta. No aneurysmal dilatation or dissection. The origins of the great vessels of the aortic arch and the central pulmonary arteries are patent. Mediastinum/Nodes: No hilar or mediastinal adenopathy. The esophagus and the thyroid  gland are grossly unremarkable. No mediastinal fluid collection. Lungs/Pleura: Background of emphysema and chronic interstitial coarsening. Bilateral linear atelectasis/scarring. No focal consolidation, pleural effusion or pneumothorax. The central airways are patent. Musculoskeletal: No acute osseous pathology CT ABDOMEN PELVIS FINDINGS No intra-abdominal free air or free fluid. Hepatobiliary: The liver is unremarkable. No biliary dilatation. The gallbladder is unremarkable. Pancreas: Unremarkable. No pancreatic ductal dilatation or surrounding inflammatory changes. Spleen: Normal in size without focal abnormality. Adrenals/Urinary Tract: The adrenal glands unremarkable. Small nonobstructing bilateral renal calculi measure up to 5 mm. There is no hydronephrosis on either side. There is symmetric enhancement and excretion of contrast by both kidneys. There is a 5 mm stone in the proximal left ureter. The visualized right ureter and urinary bladder unremarkable. Stomach/Bowel: There is diffuse gaseous distension of the stomach. No mechanical obstruction. The small bowel  demonstrates a normal caliber. The appendix is normal. Vascular/Lymphatic: Mild aortoiliac atherosclerotic disease. There is nonocclusive thrombus extending from the left external iliac vein into the lower IVC. No portal venous gas. There is no adenopathy. Reproductive: The prostate and seminal vesicles are grossly remarkable. Other: Faint area of ill-defined density in the soft tissues posterior to the left ischial tuberosity (coronal 27/6) may represent an area of edema. No drainable fluid collection at this time. Musculoskeletal: No acute osseous pathology. IMPRESSION: 1. No acute intrathoracic pathology. 2. Nonocclusive thrombus extending from the left external iliac vein into the lower IVC. 3. A 5 mm proximal left ureteral stone. No hydronephrosis. 4. Small nonobstructing bilateral renal calculi. 5. Faint area of ill-defined density in the soft tissues posterior to the left ischial tuberosity may represent an  area of edema. No drainable fluid collection at this time. 6. Aortic Atherosclerosis (ICD10-I70.0) and Emphysema (ICD10-J43.9). These results were called by telephone at the time of interpretation on 07/10/2024 at 7:21 pm to provider BERNARDINO FIREMAN , who verbally acknowledged these results. Electronically Signed   By: Vanetta Chou M.D.   On: 07/10/2024 19:37   CT Head Wo Contrast Result Date: 07/10/2024 EXAM: CT HEAD WITHOUT CONTRAST 07/10/2024 06:58:07 PM TECHNIQUE: CT of the head was performed without the administration of intravenous contrast. Automated exposure control, iterative reconstruction, and/or weight based adjustment of the mA/kV was utilized to reduce the radiation dose to as low as reasonably achievable. COMPARISON: 06/18/2024 CLINICAL HISTORY: Mental status change, unknown cause. FINDINGS: BRAIN AND VENTRICLES: No acute hemorrhage. No evidence of acute infarct. No hydrocephalus. No extra-axial collection. No mass effect or midline shift. Stable extensive chronic ischemic changes are seen  throughout the bilateral subcortical and periventricular white matter. Similar bilateral parietal occipital and bifrontal encephalomalacia. ORBITS: No acute abnormality. SINUSES: No acute abnormality. SOFT TISSUES AND SKULL: No acute soft tissue abnormality. No skull fracture. IMPRESSION: 1. No acute intracranial abnormality. 2. Stable extensive chronic ischemic changes throughout the bilateral subcortical and periventricular white matter, as well as bilateral parietal occipital and bifrontal encephalomalacia. Electronically signed by: Norman Gatlin MD 07/10/2024 07:23 PM EDT RP Workstation: [id number]   DG Chest Port 1 View Result Date: 07/10/2024 CLINICAL DATA:  Questionable sepsis. EXAM: PORTABLE CHEST 1 VIEW COMPARISON:  Chest radiograph dated 06/18/2024. FINDINGS: Bibasilar atelectasis/scarring. No focal consolidation, pleural effusion or pneumothorax. The cardiac silhouette is within normal limits. Loop recorder device. No acute osseous pathology. IMPRESSION: No active disease. Electronically Signed   By: Vanetta Chou M.D.   On: 07/10/2024 14:46   Scheduled Meds: Continuous Infusions:  heparin  1,050 Units/hr (07/11/24 0608)     LOS: 1 day    Time spent: 50 mins    Darcel Dawley, MD [facility]   If 7PM-7AM, please contact night-coverage

## 2024-07-12 DIAGNOSIS — I82522 Chronic embolism and thrombosis of left iliac vein: Secondary | ICD-10-CM

## 2024-07-12 DIAGNOSIS — A419 Sepsis, unspecified organism: Secondary | ICD-10-CM

## 2024-07-12 DIAGNOSIS — I82221 Chronic embolism and thrombosis of inferior vena cava: Secondary | ICD-10-CM

## 2024-07-12 LAB — URINE CULTURE: Culture: NO GROWTH

## 2024-07-12 LAB — APTT
aPTT: 114 s — ABNORMAL HIGH (ref 24–36)
aPTT: 96 s — ABNORMAL HIGH (ref 24–36)

## 2024-07-12 LAB — CBC
HCT: 34 % — ABNORMAL LOW (ref 39.0–52.0)
Hemoglobin: 11.6 g/dL — ABNORMAL LOW (ref 13.0–17.0)
MCH: 27.8 pg (ref 26.0–34.0)
MCHC: 34.1 g/dL (ref 30.0–36.0)
MCV: 81.3 fL (ref 80.0–100.0)
Platelets: 423 K/uL — ABNORMAL HIGH (ref 150–400)
RBC: 4.18 MIL/uL — ABNORMAL LOW (ref 4.22–5.81)
RDW: 13.7 % (ref 11.5–15.5)
WBC: 12.2 K/uL — ABNORMAL HIGH (ref 4.0–10.5)
nRBC: 0 % (ref 0.0–0.2)

## 2024-07-12 LAB — LACTIC ACID, PLASMA: Lactic Acid, Venous: 1.3 mmol/L (ref 0.5–1.9)

## 2024-07-12 LAB — HEPARIN LEVEL (UNFRACTIONATED): Heparin Unfractionated: >1.1 [IU]/mL — ABNORMAL HIGH (ref 0.30–0.70)

## 2024-07-12 MED ORDER — MEDIHONEY WOUND/BURN DRESSING EX PSTE
1.0000 | PASTE | Freq: Every day | CUTANEOUS | Status: DC
  Administered 2024-07-13 – 2024-07-16 (×4): 1 via TOPICAL
  Filled 2024-07-12 (×2): qty 44

## 2024-07-12 NOTE — Plan of Care (Signed)

## 2024-07-12 NOTE — Plan of Care (Signed)

## 2024-07-12 NOTE — TOC Initial Note (Addendum)
 Transition of Care [facility]) - Initial/Assessment Note    Patient Details  Name: Daniel Stuart MRN: [id number] Date of Birth: [birth year]  Transition of [facility]) CM/SW Contact:    Daniel Diop A [place], LCSW Phone Number: 07/12/2024, 11:19 AM  Clinical Narrative:                  CSW met with pt at bedside. Oriented to person and place only, pleasant. He requested CSW speak with pt's sister Daniel Stuart. CSW reached out and spoke with Daniel Stuart. She inquired with CSW about pt receiving respite care through his [facility] hospice services. CSW informed her to follow up with [facility] and have [facility] reach back out to CSW to coordinate if an available option. CSW waiting to hear back from [facility] regarding. CSW reached out to [employer] as well and notified.   Daniel Stuart at [facility] notified of pt's admission, can return back to facility whenever medically stable. TBD update on respite at [place] versus [place].   CSW will continue to follow.   Expected Discharge Plan: Skilled Nursing Facility Barriers to Discharge: Continued Medical Work up   Patient Goals and CMS Choice Patient states their goals for this hospitalization and ongoing recovery are:: more rest CMS Medicare.gov Compare Post Acute Care list provided to:: Patient Represenative (must comment) (pt's sister Daniel Stuart) Choice offered to / list presented to : Sibling      Expected Discharge Plan and Services In-house Referral: Clinical Social Work   Post Acute Care Choice: Skilled Nursing Facility Living arrangements for the past 2 months: Skilled Nursing Facility                                      Prior Living Arrangements/Services Living arrangements for the past 2 months: Skilled Nursing Facility Lives with:: Facility Resident          Need for Family Participation in Patient Care: Yes (Comment) Care giver support system in place?: Yes (comment) (pt's sister, Daniel Stuart Cure)      Activities of  Daily Living   ADL Screening (condition at time of admission) Independently performs ADLs?: No Does the patient have a NEW difficulty with bathing/dressing/toileting/self-feeding that is expected to last >3 days?: No Does the patient have a NEW difficulty with getting in/out of bed, walking, or climbing stairs that is expected to last >3 days?: No Does the patient have a NEW difficulty with communication that is expected to last >3 days?: No Is the patient deaf or have difficulty hearing?: No Does the patient have difficulty seeing, even when wearing glasses/contacts?: No Does the patient have difficulty concentrating, remembering, or making decisions?: Yes  Permission Sought/Granted                  Emotional Assessment Appearance:: Appears older than stated age Attitude/Demeanor/Rapport: Lethargic Affect (typically observed): Calm Orientation: : Oriented to Self, Oriented to Place Alcohol / Substance Use: Not Applicable Psych Involvement: No (comment)  Admission diagnosis:  Sepsis (HCC) [A41.9] Sepsis, due to unspecified organism, unspecified whether acute organ dysfunction present [facility]) [A41.9] Patient Active Problem List   Diagnosis Date Noted   Sepsis (HCC) 07/10/2024   Acute stroke due to ischemia (HCC) 06/19/2024   Acute DVT (deep venous thrombosis) (HCC) 05/08/2024   airspace disease 04/13/2024   Prolonged QT interval 04/13/2024   Ileus (HCC) 03/05/2024   Edema of right upper arm 02/28/2024   Protein calorie  malnutrition (HCC) 02/27/2024   Pressure injury of skin 02/24/2024   Colonic pseudoobstruction 02/23/2024   Colitis 02/23/2024   Hypokalemia 02/23/2024   History of intracranial hemorrhage 02/23/2024   Essential hypertension 02/23/2024   Antiphospholipid syndrome (HCC) 02/23/2024   Left carotid artery occlusion 09/09/2022   Weakness of both lower extremities 09/07/2022   Bedbound 08/25/2022   PAD (peripheral artery disease) (HCC) 08/25/2022    Hyperlipidemia 05/03/2022   Arrhythmia as indication for cardiac pacemaker replacement 03/11/2020   Vitamin D  deficiency 09/13/2019   History of intracranial hemorrhage History of severe encephalopathy History of CVA/TIA/progressive leukoencephalopathy and chronic left ICA stenosis 03/12/2019   Right sided weakness 03/12/2019   Anxiety 03/12/2019   PVD (peripheral vascular disease) (HCC) 04/12/2017   Carotid arterial disease (HCC) 04/12/2017   PCP:  Paseda, Folashade R, [name] Pharmacy:   [facility] - [place], [place] - [address] [place] [address] Phone: [phone] Fax: [phone]     Social Drivers of Health (SDOH) Social History: SDOH Screenings   Food Insecurity: No Food Insecurity (07/11/2024)  Housing: Low Risk  (07/11/2024)  Transportation Needs: No Transportation Needs (07/11/2024)  Utilities: Not At Risk (07/11/2024)  Alcohol Screen: Low Risk  (07/31/2021)  Depression (PHQ2-9): Low Risk  (04/13/2022)  Financial Resource Strain: Medium Risk (07/31/2021)  Physical Activity: Inactive (07/31/2021)  Social Connections: Socially Isolated (07/31/2021)  Stress: No Stress Concern Present (07/31/2021)  Tobacco Use: High Risk (06/19/2024)   SDOH Interventions:     Readmission Risk Interventions    06/21/2024   11:18 AM 04/16/2024   10:19 AM  Readmission Risk Prevention Plan  Transportation Screening Complete Complete  PCP or Specialist Appt within 5-7 Days  Complete  Home Care Screening  Complete  Medication Review (RN CM)  Complete  Palliative Care Screening Complete   Skilled Nursing Facility Complete

## 2024-07-12 NOTE — Progress Notes (Deleted)
 Ellouise Console, PA-C [address] [place], [place]  [address] Phone: [phone]   Gastroenterology Consultation  Referring Provider:     Paseda, Folashade R, [name] Primary Care Physician:  Paseda, Folashade R, [name] Primary Gastroenterologist:  Ellouise Console, PA-C / *** Reason for Consultation:     Abdominal pain        HPI:   Daniel Stuart is a 58 y.o. y/o male referred for consultation & management  by Paseda, Folashade R, [name].    History of multiple cryptogenic strokes bedbound.  Lives in skilled nursing facility.  New patient presents for hospital follow-up of abdominal pain and colonic ileus.  No previous GI evaluation.  07/10/2024: Admitted to hospital for sepsis.  Blood cultures obtained and was started on Zosyn .  CT chest abdomen pelvis showed no clear source of sepsis.  Currently in hospital for monitoring.  06/18/2024 until 06/21/2024: Admitted to hospital for another recurrent acute stroke and hypokalemia.  05/07/2024 -05/11/2024 admitted to [facility] for from SNF with abdominal distention/possible dark stools-CT imaging done to evaluate abdominal distention/dark stools showed colonic ileus, DVT in LLE extending to IVC. Stools were brown and FOBT negative in the ED. He was started on IV heparin -vascular surgery was consulted and patient was transferred to [facility] for further evaluation and treatment.   04/2024 hospitalized for abdominal distention and colonic ileus.  Treated with MiraLAX  twice daily, Senokot-S, Dulcolax suppository, and enema as needed every other day.  Admitted in May 2025 for pseudo obstruction of colon and managed conservatively. Followed by [facility] for persistent colonic ileus. -History of chronic ileus for several years, bedbound with progressive leukoencephalopathy, followed by [facility] gastroenterology.  Has recurrent issues with ileus, abdominal distention. Admitted again with concerns for abdominal distention.  GI was consulted, seen by Dr. Kristie.   Patient reported no abdominal pain nausea or vomiting  PMH: PAD, PVD, DVT, CVA, hypertension, antiphospholipid syndrome, bedbound, history of intracranial hemorrhage with severe encephalopathy, left ICA stenosis.  Internal cardiac pacemaker / loop recorder.  Currently on Xarelto.  Past Medical History:  Diagnosis Date   Anxiety    Arrhythmia as indication for cardiac pacemaker replacement 03/2020   Frequent falls 03/2020   History of loop recorder 04/2017   ICH (intracerebral hemorrhage) (HCC) 06/19/2023   Stroke (cerebrum) (HCC)    Tobacco use    Urinary frequency 09/2019   Vitamin D  deficiency     Past Surgical History:  Procedure Laterality Date   FOOT SURGERY     LOOP RECORDER INSERTION N/A 04/13/2017   Procedure: Loop Recorder Insertion;  Surgeon: Waddell Danelle ORN, MD;  Location: [facility] INVASIVE CV LAB;  Service: Cardiovascular;  Laterality: N/A;   TEE WITHOUT CARDIOVERSION N/A 04/13/2017   Procedure: TRANSESOPHAGEAL ECHOCARDIOGRAM (TEE);  Surgeon: Jeffrie Oneil BROCKS, MD;  Location: [facility] ENDOSCOPY;  Service: Cardiovascular;  Laterality: N/A;    Prior to Admission medications   Medication Sig Start Date End Date Taking? Authorizing Provider  acetaminophen  (TYLENOL ) 325 MG tablet Take 2 tablets (650 mg total) by mouth every 6 (six) hours as needed for mild pain, moderate pain, fever or headache (or temp > 37.5 C (99.5 F)). 06/22/23   Hongalgi, Trenda BIRCH, MD  apixaban  (ELIQUIS ) 5 MG TABS tablet Take 1 tablet (5 mg total) by mouth 2 (two) times daily. 05/10/24   Ghimire, Donalda HERO, MD  bisacodyl  (DULCOLAX) 10 MG suppository Place 1 suppository (10 mg total) rectally daily as needed for mild constipation. 04/18/24  Rai, Ripudeep MARLA, MD  Cholecalciferol  25 MCG (1000 UT) tablet Take 1,000 Units by mouth every morning.    [provider]  cyanocobalamin  1000 MCG tablet Take 1 tablet (1,000 mcg total) by mouth daily. 09/11/22   Antoinette Doe, MD  feeding supplement (ENSURE ENLIVE / ENSURE PLUS)  LIQD Take 237 mLs by mouth 2 (two) times daily between meals. Patient taking differently: Take 237 mLs by mouth 3 (three) times daily between meals. Chocolate if available 03/02/24   Tobie Yetta HERO, MD  gabapentin  (NEURONTIN ) 300 MG capsule TAKE 1 CAPSULE BY MOUTH BY MOUTH TWO TIMES DAILY Patient taking differently: Take 300 mg by mouth 2 (two) times daily. 04/22/22 07/10/24  Skeet Juliene SAUNDERS, DO  ipratropium-albuterol  (DUONEB) 0.5-2.5 (3) MG/3ML SOLN Take 3 mLs by nebulization every 6 (six) hours as needed (SOB, wheezing).    [provider]  ketoconazole (NIZORAL) 2 % shampoo Apply 1 Application topically 2 (two) times a week. Monday and Thursday    [provider]  Lactobacillus (ACIDOPHILUS) 100 MG CAPS Take 200 mg by mouth in the morning.    [provider]  liver oil-zinc  oxide (DESITIN) 40 % ointment Apply 1 Application topically as needed for irritation (With each incontinent episode daily).    [provider]  magnesium  hydroxide (MILK OF MAGNESIA) 400 MG/5ML suspension Take 30 mLs by mouth daily as needed for mild constipation.    [provider]  nutrition supplement, JUVEN, (JUVEN) PACK Take 1 packet by mouth 2 (two) times daily between meals. 03/09/24   Patsy Lenis, MD  ondansetron  (ZOFRAN ) 4 MG tablet Take 1 tablet (4 mg total) by mouth every 6 (six) hours as needed for nausea. 04/18/24   Rai, Nydia MARLA, MD  pantoprazole  (PROTONIX ) 40 MG tablet Take 1 tablet (40 mg total) by mouth at bedtime. 06/22/23   Hongalgi, Anand D, MD  polyethylene glycol (MIRALAX  / GLYCOLAX ) 17 g packet Take 17 g by mouth 2 (two) times daily. 03/09/24   Patsy Lenis, MD  potassium chloride  (KLOR-CON ) 20 MEQ packet Take 40 mEq by mouth 3 (three) times daily. Patient taking differently: Take 40 mEq by mouth every 8 (eight) hours. 06/21/24   Raenelle Coria, MD  senna-docusate (SENOKOT-S) 8.6-50 MG tablet Take 1 tablet by mouth at bedtime. 04/18/24   Rai, Nydia MARLA, MD   simethicone  (MYLICON) 80 MG chewable tablet Chew 1 tablet (80 mg total) by mouth 4 (four) times daily -  before meals and at bedtime. Patient taking differently: Chew 80 mg by mouth 4 (four) times daily. 03/02/24   Tobie Yetta HERO, MD  sodium bicarbonate  650 MG tablet Take 650 mg by mouth 2 (two) times daily.    [provider]  sodium phosphate (FLEET) ENEM Place 1 enema rectally every other day.    [provider]  Sodium Phosphates  (FLEET SALINE ENEMA RE) Place 1 application  rectally daily as needed.    [provider]    Family History  Problem Relation Age of Onset   Diabetes Mother    COPD Mother    Diabetes Father    Heart failure Father    Cancer Sister      Social History   Tobacco Use   Smoking status: Every Day    Current packs/day: 1.00    Average packs/day: 1 pack/day for 46.7 years (46.7 ttl pk-yrs)    Types: Cigarettes    Start date: 11/01/1977   Smokeless tobacco: Never  Vaping Use   Vaping  status: Never Used  Substance Use Topics   Alcohol use: No   Drug use: No    Allergies as of 07/13/2024   (No Known Allergies)    Review of Systems:    All systems reviewed and negative except where noted in HPI.   Physical Exam:  There were no vitals taken for this visit. No LMP for male patient.  General:   Alert,  Well-developed, well-nourished, pleasant and cooperative in NAD Lungs:  Respirations even and unlabored.  Clear throughout to auscultation.   No wheezes, crackles, or rhonchi. No acute distress. Heart:  Regular rate and rhythm; no murmurs, clicks, rubs, or gallops. Abdomen:  Normal bowel sounds.  No bruits.  Soft, and non-distended without masses, hepatosplenomegaly or hernias noted.  No Tenderness.  No guarding or rebound tenderness.    Neurologic:  Alert and oriented x3;  grossly normal neurologically. Psych:  Alert and cooperative. Normal mood and affect.  Imaging Studies: DG Knee 2 Views Right Result Date:  07/10/2024 CLINICAL DATA:  Sepsis EXAM: RIGHT KNEE - 1-2 VIEW COMPARISON:  None Available. FINDINGS: Limited study with the knee imaged in a flexed position. Patient was unable to straighten leg. No visible fracture, subluxation or dislocation. No visible joint effusion. IMPRESSION: No visible acute bony abnormality. Electronically Signed   By: Franky Crease M.D.   On: 07/10/2024 20:12   DG Ankle 2 Views Right Result Date: 07/10/2024 CLINICAL DATA:  Sepsis EXAM: RIGHT ANKLE - 2 VIEW COMPARISON:  None Available. FINDINGS: There is no evidence of fracture, dislocation, or joint effusion. There is no evidence of arthropathy or other focal bone abnormality. Soft tissues are unremarkable. IMPRESSION: Negative. Electronically Signed   By: Franky Crease M.D.   On: 07/10/2024 20:11   CT CHEST ABDOMEN PELVIS W CONTRAST Result Date: 07/10/2024 CLINICAL DATA:  Sepsis. EXAM: CT CHEST, ABDOMEN, AND PELVIS WITH CONTRAST TECHNIQUE: Multidetector CT imaging of the chest, abdomen and pelvis was performed following the standard protocol during bolus administration of intravenous contrast. RADIATION DOSE REDUCTION: This exam was performed according to the departmental dose-optimization program which includes automated exposure control, adjustment of the mA and/or kV according to patient size and/or use of iterative reconstruction technique. CONTRAST:  75mL OMNIPAQUE  IOHEXOL  350 MG/ML SOLN COMPARISON:  CT abdomen pelvis dated 06/18/2024. FINDINGS: Evaluation of this exam is limited due to respiratory motion as well as streak artifact caused by patient's arms. CT CHEST FINDINGS Cardiovascular: There is no cardiomegaly or pericardial effusion. Mild atherosclerotic calcification of the thoracic aorta. No aneurysmal dilatation or dissection. The origins of the great vessels of the aortic arch and the central pulmonary arteries are patent. Mediastinum/Nodes: No hilar or mediastinal adenopathy. The esophagus and the thyroid  gland are  grossly unremarkable. No mediastinal fluid collection. Lungs/Pleura: Background of emphysema and chronic interstitial coarsening. Bilateral linear atelectasis/scarring. No focal consolidation, pleural effusion or pneumothorax. The central airways are patent. Musculoskeletal: No acute osseous pathology CT ABDOMEN PELVIS FINDINGS No intra-abdominal free air or free fluid. Hepatobiliary: The liver is unremarkable. No biliary dilatation. The gallbladder is unremarkable. Pancreas: Unremarkable. No pancreatic ductal dilatation or surrounding inflammatory changes. Spleen: Normal in size without focal abnormality. Adrenals/Urinary Tract: The adrenal glands unremarkable. Small nonobstructing bilateral renal calculi measure up to 5 mm. There is no hydronephrosis on either side. There is symmetric enhancement and excretion of contrast by both kidneys. There is a 5 mm stone in the proximal left ureter. The visualized right ureter and urinary bladder unremarkable. Stomach/Bowel: There is diffuse gaseous  distension of the stomach. No mechanical obstruction. The small bowel demonstrates a normal caliber. The appendix is normal. Vascular/Lymphatic: Mild aortoiliac atherosclerotic disease. There is nonocclusive thrombus extending from the left external iliac vein into the lower IVC. No portal venous gas. There is no adenopathy. Reproductive: The prostate and seminal vesicles are grossly remarkable. Other: Faint area of ill-defined density in the soft tissues posterior to the left ischial tuberosity (coronal 27/6) may represent an area of edema. No drainable fluid collection at this time. Musculoskeletal: No acute osseous pathology. IMPRESSION: 1. No acute intrathoracic pathology. 2. Nonocclusive thrombus extending from the left external iliac vein into the lower IVC. 3. A 5 mm proximal left ureteral stone. No hydronephrosis. 4. Small nonobstructing bilateral renal calculi. 5. Faint area of ill-defined density in the soft tissues  posterior to the left ischial tuberosity may represent an area of edema. No drainable fluid collection at this time. 6. Aortic Atherosclerosis (ICD10-I70.0) and Emphysema (ICD10-J43.9). These results were called by telephone at the time of interpretation on 07/10/2024 at 7:21 pm to provider BERNARDINO FIREMAN , who verbally acknowledged these results. Electronically Signed   By: Vanetta Chou M.D.   On: 07/10/2024 19:37   CT Head Wo Contrast Result Date: 07/10/2024 EXAM: CT HEAD WITHOUT CONTRAST 07/10/2024 06:58:07 PM TECHNIQUE: CT of the head was performed without the administration of intravenous contrast. Automated exposure control, iterative reconstruction, and/or weight based adjustment of the mA/kV was utilized to reduce the radiation dose to as low as reasonably achievable. COMPARISON: 06/18/2024 CLINICAL HISTORY: Mental status change, unknown cause. FINDINGS: BRAIN AND VENTRICLES: No acute hemorrhage. No evidence of acute infarct. No hydrocephalus. No extra-axial collection. No mass effect or midline shift. Stable extensive chronic ischemic changes are seen throughout the bilateral subcortical and periventricular white matter. Similar bilateral parietal occipital and bifrontal encephalomalacia. ORBITS: No acute abnormality. SINUSES: No acute abnormality. SOFT TISSUES AND SKULL: No acute soft tissue abnormality. No skull fracture. IMPRESSION: 1. No acute intracranial abnormality. 2. Stable extensive chronic ischemic changes throughout the bilateral subcortical and periventricular white matter, as well as bilateral parietal occipital and bifrontal encephalomalacia. Electronically signed by: Norman Gatlin MD 07/10/2024 07:23 PM EDT RP Workstation: [id number]   DG Chest Port 1 View Result Date: 07/10/2024 CLINICAL DATA:  Questionable sepsis. EXAM: PORTABLE CHEST 1 VIEW COMPARISON:  Chest radiograph dated 06/18/2024. FINDINGS: Bibasilar atelectasis/scarring. No focal consolidation, pleural effusion or pneumothorax.  The cardiac silhouette is within normal limits. Loop recorder device. No acute osseous pathology. IMPRESSION: No active disease. Electronically Signed   By: Vanetta Chou M.D.   On: 07/10/2024 14:46   CT ANGIO HEAD NECK W WO CM Result Date: 06/19/2024 CLINICAL DATA:  Provided history: Stroke/TIA, determine embolic source. EXAM: CT ANGIOGRAPHY HEAD AND NECK WITH AND WITHOUT CONTRAST TECHNIQUE: Multidetector CT imaging of the head and neck was performed using the standard protocol during bolus administration of intravenous contrast. Multiplanar CT image reconstructions and MIPs were obtained to evaluate the vascular anatomy. Carotid stenosis measurements (when applicable) are obtained utilizing NASCET criteria, using the distal internal carotid diameter as the denominator. RADIATION DOSE REDUCTION: This exam was performed according to the departmental dose-optimization program which includes automated exposure control, adjustment of the mA and/or kV according to patient size and/or use of iterative reconstruction technique. CONTRAST:  75mL OMNIPAQUE  IOHEXOL  350 MG/ML SOLN COMPARISON:  Brain MRI 06/18/2024. Head CT 06/18/2024. CT angiogram head/neck 06/19/2023. FINDINGS: CT HEAD FINDINGS Brain: Known acute cortical/subcortical infarct within the right frontal operculum and right insula (MCA  vascular territory), not clearly changed in extent since yesterday's brain MRI. As before, there are extensive background chronic infarcts within the bilateral cerebral hemispheres and extensive background cerebral white matter disease. Tiny chronic infarcts within the bilateral cerebellar hemispheres which were better appreciated on yesterday's brain MRI. There is no acute intracranial hemorrhage. No extra-axial fluid collection. No evidence of an intracranial mass. No midline shift. Vascular: No hyperdense vessel.  A sclerotic calcifications. Skull: No calvarial fracture or aggressive osseous lesion. Sinuses/Orbits: No  orbital mass or acute orbital finding. Minimal mucosal thickening within the bilateral maxillary sinuses. Review of the MIP images confirms the above findings CTA NECK FINDINGS Aortic arch: Common origin of the innominate and left common carotid arteries. The left vertebral artery arises directly from the aortic arch. Streak/beam hardening artifact arising from a dense contrast bolus partially obscures the left subclavian artery. Within this limitation, there is no appreciable hemodynamically significant innominate or proximal subclavian artery stenosis. Right carotid system: CCA and ICA patent within the neck without stenosis or significant atherosclerotic disease. Left carotid system: CCA patent within the neck. As before, the ICA becomes occluded shortly beyond its origin and remains occluded throughout the remainder of the neck. Vertebral arteries: Codominant and patent within the neck without stenosis or significant atherosclerotic disease. Skeleton: Poor dentition. Dextrocurvature of the cervical spine. Nonspecific reversal of the expected cervical lordosis. Cervical spondylosis. Other neck: Subcentimeter left thyroid  lobe nodule not meeting consensus criteria for ultrasound follow-up based on size. Upper chest: No consolidation within the imaged lung apices. Emphysema. Review of the MIP images confirms the above findings CTA HEAD FINDINGS Anterior circulation: The intracranial left ICA remains occluded throughout the siphon region. As before, there is reconstitution of enhancement within the left ICA beginning at the paraclinoid level. The right ICA is patent intracranially. The M1 middle cerebral arteries are patent. No M2 proximal branch occlusion or high-grade proximal stenosis. The anterior cerebral arteries are patent. Hypoplastic left A1 segment. No intracranial aneurysm is identified. Posterior circulation: The intracranial vertebral arteries are patent. The basilar artery is patent. The posterior  cerebral arteries are patent. Posterior communicating arteries are present bilaterally. Venous sinuses: Assessment for dural venous sinus thrombosis is limited due to contrast timing. Anatomic variants: As described. Review of the MIP images confirms the above findings IMPRESSION: Non-contrast head CT: 1. Acute right MCA territory infarct, unchanged in extent as compared to yesterday's brain MRI. 2. Background advanced cerebral white matter disease and extensive chronic infarcts, as described. CTA neck: 1. Unchanged from the prior CTA of 06/19/2023, the left internal carotid artery becomes occluded shortly beyond its origin and remains occluded throughout the remainder of the neck. 2. The right common carotid, right internal carotid and bilateral vertebral arteries are patent within the neck without stenosis or significant atherosclerotic disease. 3. Aortic Atherosclerosis (ICD10-I70.0) and Emphysema (ICD10-J43.9). CTA head: 1. Unchanged from the prior CTA of 06/19/2023, the intracranial left internal carotid artery remains occluded throughout the siphon region (with reconstitution of enhancement beginning at the paraclinoid level). 2. No proximal intracranial large vessel occlusion or high-grade proximal arterial stenosis identified elsewhere. Electronically Signed   By: Rockey Childs D.O.   On: 06/19/2024 16:04   MR Brain W and Wo Contrast Result Date: 06/18/2024 CLINICAL DATA:  Stroke, follow up patient reporting blurred vision, new onset - difficult to discern further history EXAM: MRI HEAD WITHOUT AND WITH CONTRAST TECHNIQUE: Multiplanar, multiecho pulse sequences of the brain and surrounding structures were obtained without and with intravenous contrast. CONTRAST:  6mL GADAVIST  GADOBUTROL  1 MMOL/ML IV SOLN COMPARISON:  MRI head 05/08/2024. FINDINGS: Brain: Acute right insular and overlying operculuminfarct involving both cortex and subcortical white matter with associated restricted diffusion. Associated  edema without mass effect or midline shift. Bilateral parieto-occipital and bifrontal encephalomalacia. Evidence of prior hemorrhage in the right parietal and occipital region. Extensive confluent T2/FLAIR hyperintensity in the white matter. Vascular: Normal flow voids. Skull and upper cervical spine: Normal marrow signal. Sinuses/Orbits: Negative. IMPRESSION: 1. Acute right insular and overlying operculum infarct. No mass effect. 2. Multiple areas of encephalomalacia and extensive chronic microvascular ischemic disease. Electronically Signed   By: Gilmore GORMAN Molt M.D.   On: 06/18/2024 22:05   CT ABDOMEN PELVIS W CONTRAST Result Date: 06/18/2024 CLINICAL DATA:  Abdominal distension. EXAM: CT ABDOMEN AND PELVIS WITH CONTRAST TECHNIQUE: Multidetector CT imaging of the abdomen and pelvis was performed using the standard protocol following bolus administration of intravenous contrast. RADIATION DOSE REDUCTION: This exam was performed according to the departmental dose-optimization program which includes automated exposure control, adjustment of the mA and/or kV according to patient size and/or use of iterative reconstruction technique. CONTRAST:  75mL OMNIPAQUE  IOHEXOL  350 MG/ML SOLN COMPARISON:  CT chest abdomen and pelvis 04/13/2024. FINDINGS: Lower chest: Patchy ground-glass opacities are seen in both lung bases. Airspace disease in the right middle lobe has decreased, but has not completely resolved. Hepatobiliary: No focal liver abnormality is seen. No gallstones, gallbladder wall thickening, or biliary dilatation. Pancreas: Unremarkable. No pancreatic ductal dilatation or surrounding inflammatory changes. Spleen: Normal in size without focal abnormality. Adrenals/Urinary Tract: There is scarring in the inferior pole the right kidney. There are punctate bilateral renal calculi measuring up to 5 mm. These are unchanged. There is no hydronephrosis or perinephric fluid. The adrenal glands and bladder are within  normal limits. Stomach/Bowel: The colon is diffusely dilated with air-fluid levels to the level the rectum measuring up to 8.7 cm. There is no twisting or mesenteric edema identified. No colonic wall thickening identified. The appendix appears within normal limits. Small bowel and stomach are within normal limits. No pneumatosis or free air. Vascular/Lymphatic: Aorta and IVC are normal in size per there are findings concerning for nonocclusive thrombus within the inferior vena cava left common femoral vein, and left external iliac vein. No enlarged lymph nodes are identified. Reproductive: Prostate is unremarkable. Other: No abdominal wall hernia or abnormality. No abdominopelvic ascites. Musculoskeletal: No fracture is seen. IMPRESSION: 1. Diffuse colonic dilatation with air-fluid levels to the level of the rectum. Findings are concerning for colonic ileus. No evidence for bowel obstruction. 2. Findings nonocclusive thrombus within the inferior vena cava, left common femoral vein, and left external iliac vein. 3. Nonobstructing bilateral renal calculi. 4. Patchy ground-glass opacities in both lung bases, likely infectious/inflammatory. Electronically Signed   By: Greig Pique M.D.   On: 06/18/2024 17:13   CT Head Wo Contrast Result Date: 06/18/2024 CLINICAL DATA:  Bilateral blurred vision for greater than 24 hours EXAM: CT HEAD WITHOUT CONTRAST TECHNIQUE: Contiguous axial images were obtained from the base of the skull through the vertex without intravenous contrast. RADIATION DOSE REDUCTION: This exam was performed according to the departmental dose-optimization program which includes automated exposure control, adjustment of the mA and/or kV according to patient size and/or use of iterative reconstruction technique. COMPARISON:  05/07/2024 FINDINGS: Brain: Stable diffuse cerebral atrophy. Stable chronic ischemic changes are again seen throughout the bilateral subcortical and periventricular white matter, with  stable encephalomalacia in the right occipital region. No evidence of  acute infarct or hemorrhage. The lateral ventricles and midline structures are unremarkable. No acute extra-axial fluid collections. No mass effect. Vascular: No hyperdense vessel or unexpected calcification. Skull: Normal. Negative for fracture or focal lesion. Sinuses/Orbits: No acute finding. Other: None. IMPRESSION: 1. No acute intracranial process. 2. Stable cortical atrophy, encephalomalacia, and chronic ischemic changes as above. Electronically Signed   By: Ozell Daring M.D.   On: 06/18/2024 17:11   DG Chest Portable 1 View Result Date: 06/18/2024 CLINICAL DATA:  Visual changes EXAM: PORTABLE CHEST 1 VIEW COMPARISON:  02/26/2024 FINDINGS: Single frontal view of the chest demonstrates a stable cardiac silhouette. Stable bibasilar scarring. No airspace disease, effusion, or pneumothorax. No acute bony abnormalities. IMPRESSION: 1. No acute intrathoracic process. Electronically Signed   By: Ozell Daring M.D.   On: 06/18/2024 16:16    Labs: CBC    Component Value Date/Time   WBC 12.2 (H) 07/12/2024 0026   RBC 4.18 (L) 07/12/2024 0026   HGB 11.6 (L) 07/12/2024 0026   HGB 15.3 06/11/2020 1041   HCT 34.0 (L) 07/12/2024 0026   HCT 44.9 06/11/2020 1041   PLT 423 (H) 07/12/2024 0026   PLT 230 06/11/2020 1041   MCV 81.3 07/12/2024 0026   MCV 93 06/11/2020 1041    CMP     Component Value Date/Time   NA 134 (L) 07/11/2024 0441   NA 140 01/11/2022 1121   K 3.6 07/11/2024 0441   CL 101 07/11/2024 0441   CO2 21 (L) 07/11/2024 0441   GLUCOSE 108 (H) 07/11/2024 0441   BUN 11 07/11/2024 0441   BUN 5 (L) 01/11/2022 1121   CREATININE 0.79 07/11/2024 0441   CREATININE 1.24 04/18/2017 1012   CALCIUM  8.3 (L) 07/11/2024 0441   PROT 7.7 07/10/2024 1539   PROT 7.6 01/11/2022 1121   ALBUMIN 2.3 (L) 07/10/2024 1539   ALBUMIN 4.2 01/11/2022 1121   AST 52 (H) 07/10/2024 1539   ALT 37 07/10/2024 1539   ALKPHOS 97 07/10/2024  1539   BILITOT 0.5 07/10/2024 1539   BILITOT 0.4 01/11/2022 1121   GFRNONAA >60 07/11/2024 0441   GFRNONAA 66 04/18/2017 1012   GFRAA 80 06/11/2020 1041   GFRAA 76 04/18/2017 1012    Assessment and Plan:   TYRIN HERBERS is a 58 y.o. y/o male has been referred for ***  Follow up ***  Ellouise Console, PA-C

## 2024-07-12 NOTE — Progress Notes (Signed)
 [facility] 7T81 [facility] Patient  Daniel Stuart is a current [facility] patient with a terminal diagnosis of cerebrovascular disease who resides at [facility]. Patient was recently hospitalized with treatment for pseudo ileus and since return to the facility has not been doing well. He was found to have low BP and high heart rate and family made choice for him to be evaluated. He has chosen to remain a full code. He was admitted on 9.9.25 with diagnosis of Sepsis and per Dr. Norleen Laurence this is a related hospital admission.   Visited patient bedside.  He is alert and oriented to self and place.  Symptom management continues as patient appears uncomfortable during visit.  Patient approved contact with pcg.   Patient is inpatient appropriate with need for labs/diagnostics and IVAB.  Vital Signs- 98/95/19   106/66   sp02 100% on room air  I&O- +724/not recorded  Abnormal Labs-  Heparin  >1.10; APTT 96  Diagnostics-  No new imaging on 9.11.25   Head and Chest CT without acute findings, Chest/Knee/Ankle x-rays all without acute findings IV/PRN Meds- Heparin  69ml/H continuous IV, Zosyn  3.375g IV q8H   Current plan as per Darcel Dawley MD 9.10.25 3:34PM Principal Problem:   Sepsis (HCC) Active Problems:   PVD (peripheral vascular disease) (HCC)   Bedbound   PAD (peripheral artery disease) (HCC)   Acute stroke due to ischemia (HCC)  Sepsis of unclear etiology : UA is unremarkable. He has a ureteral stone but it is not causing obstruction and UA is normal.   CT of the chest reveals no evidence of infection.   CT of abdomen pelvis reveals a nonocclusive DVT in the external iliac vein first identified in 06/2024, but no evidence of infection. There is ill-defined edema over the left ischial tuberosity. Blood cultures have been drawn. Initiated on empiric IV Zosyn . The patient was already on Xarelto for history of DVT but has this large new DVT so he has been placed on  IV heparin . He will require sedation to examine his heel and backside.   His ankle x-ray did not reveal significant swelling or injury. Mild edema of the ischium may be a budding decubitus.   Monitor for signs of infection.  Non obstructing renal stones: Urology recommended monitoring.  Lower IVC DVT : the clot occurred while he was on Eliquis .  He was switched to Xarelto.   Coumadin is felt to be too high risk because of his history of intracranial hemorrhage.   Lovenox  may be better protection. Consider hematology consult.  The patient has been started on IV heparin  in the emergency department  Discharge Planning- ongoing, likely once medically optimized  Family Contact - patient's sister, [name], texted as her voicemail was full  IDT: updated  Goals of Care: Full  Thank you for the opportunity to participate in this patient's care, please don't hesitate to call for any hospice related questions or concerns.   When patient is ready for discharge please use [facility] as we contract this service for our patients.   [name] BSN, [facility] Liaison [phone]

## 2024-07-12 NOTE — Progress Notes (Signed)
 PHARMACY - ANTICOAGULATION CONSULT NOTE  Pharmacy Consult for Heparin  Indication: DVT  No Known Allergies  Patient Measurements: Height: 5' 6 (167.6 cm) Weight: 59 kg (130 lb 1.1 oz) IBW/kg (Calculated) : 63.8 HEPARIN  DW (KG): 59  Vital Signs: Temp: 97.8 F (36.6 C) (09/11 0417) Temp Source: Oral (09/11 0417) BP: 127/74 (09/11 0417) Pulse Rate: 95 (09/11 0417)  Labs: Recent Labs    07/10/24 1539 07/10/24 2007 07/10/24 2007 07/11/24 0441 07/11/24 1523 07/12/24 0026 07/12/24 0832  HGB  --  13.5   < > 11.5*  --  11.6*  --   HCT  --  42.3  --  34.7*  --  34.0*  --   PLT  --  449*  --  422*  --  423*  --   APTT  --   --    < > 60* 59* 114* 96*  LABPROT 25.2*  --   --   --   --   --   --   INR 2.2*  --   --   --   --   --   --   HEPARINUNFRC  --   --   --  >1.10* >1.10* >1.10*  --   CREATININE 1.11  --   --  0.79  --   --   --    < > = values in this interval not displayed.    Estimated Creatinine Clearance: 81.9 mL/min (by C-G formula based on SCr of 0.79 mg/dL).   Medical History: Past Medical History:  Diagnosis Date   Anxiety    Arrhythmia as indication for cardiac pacemaker replacement 03/2020   Frequent falls 03/2020   History of loop recorder 04/2017   ICH (intracerebral hemorrhage) (HCC) 06/19/2023   Stroke (cerebrum) (HCC)    Tobacco use    Urinary frequency 09/2019   Vitamin D  deficiency     Assessment: Palliative pt with terminal chronic cerebrovascular disease. Pt on PTA apixaban  and last dose on 9/9 at 8 AM. Will utilize heparin  for now and consider restarting apixaban  when stable.  aPTT therapeutic at 1150 units/hr, anti-Xa level elevated as expected with last apixaban  dose. Per RN, no signs/symptoms of bleeding. Hgb low, stable  Goal of Therapy:  Heparin  level 0.3-0.7 units/ml aPTT 66-102 seconds Monitor platelets by anticoagulation protocol: Yes   Plan:  Decrease empirically to heparin  infusion 1100 units/hr Check heparin  level and aPTT  daily while on heparin  Continue to monitor H&H and platelets  Thank you for allowing pharmacy to be a part of this patient's care.  Shelba Collier, PharmD, BCPS Clinical Pharmacist

## 2024-07-12 NOTE — Consult Note (Addendum)
 WOC Nurse Consult Note:patient familiar to WOC team from previous admission with Stage 3 Pressure Injury R malleolus  Reason for Consult: B ankle wounds  Wound type: Unstageable Pressure Injury L lateral malleolus, Stage 3 Pressure Injury R lateral malleolus  Pressure Injury POA: Yes Measurement: see nursing flowsheet  Wound bed: R ankle 60% red 40% yellow; L ankle 100% tan necrotic tissue  Drainage (amount, consistency, odor) see nursing flowsheet  Periwound: appears intact  Dressing procedure/placement/frequency: Cleanse B ankle wounds with Vashe wound cleanser Soila [phone]) do not rinse and allow to air dry.  Apply Medihoney to wound bed daily, cover with dry gauze and secure with silicone foam.    Patient is bedbound and contracted, would recommend placing B feet in Prevalon boots to offload pressure.  Gerlean [phone]  POC discussed with bedside nurse. WOC team will not follow. Re-consult if further needs arise.   Thank you,    Powell Bar MSN, RN-BC, [employer]

## 2024-07-12 NOTE — Progress Notes (Signed)
 PHARMACY - ANTICOAGULATION CONSULT NOTE  Pharmacy Consult for Heparin  Indication: DVT  No Known Allergies  Patient Measurements: Height: 5' 6 (167.6 cm) Weight: 59 kg (130 lb 1.1 oz) IBW/kg (Calculated) : 63.8 HEPARIN  DW (KG): 59  Vital Signs: Temp: 98.6 F (37 C) (09/10 2048) Temp Source: Oral (09/10 2048) BP: 113/72 (09/10 2048) Pulse Rate: 109 (09/10 2048)  Labs: Recent Labs    07/10/24 1539 07/10/24 2007 07/10/24 2007 07/11/24 0441 07/11/24 1523 07/12/24 0026  HGB  --  13.5   < > 11.5*  --  11.6*  HCT  --  42.3  --  34.7*  --  34.0*  PLT  --  449*  --  422*  --  423*  APTT  --   --   --  60* 59* 114*  LABPROT 25.2*  --   --   --   --   --   INR 2.2*  --   --   --   --   --   HEPARINUNFRC  --   --   --  >1.10* >1.10* >1.10*  CREATININE 1.11  --   --  0.79  --   --    < > = values in this interval not displayed.    Estimated Creatinine Clearance: 81.9 mL/min (by C-G formula based on SCr of 0.79 mg/dL).   Medical History: Past Medical History:  Diagnosis Date   Anxiety    Arrhythmia as indication for cardiac pacemaker replacement 03/2020   Frequent falls 03/2020   History of loop recorder 04/2017   ICH (intracerebral hemorrhage) (HCC) 06/19/2023   Stroke (cerebrum) (HCC)    Tobacco use    Urinary frequency 09/2019   Vitamin D  deficiency     Assessment: Palliative pt with terminal chronic cerebrovascular disease. Pt on PTA apixaban  and last dose on 9/9 at 8 AM. Will utilize heparin  for now and consider restarting apixaban  when stable.  AM: aPTT supra-therapeutic after increase to 1250 units/hr, anti-Xa level elevated as expected with last apixaban  dose. Per RN, no signs/symptoms of bleeding. Hgb low, stable  Goal of Therapy:  Heparin  level 0.3-0.7 units/ml aPTT 66-102 seconds Monitor platelets by anticoagulation protocol: Yes   Plan:  Decrease heparin  gtt to 1150 units/hr F/u 6 hour aPTT F/u long term AC plan  Lynwood Poplar, PharmD,  BCPS Clinical Pharmacist 07/12/2024 1:09 AM

## 2024-07-12 NOTE — Progress Notes (Signed)
 PROGRESS NOTE    Daniel Stuart  FMW:[id number] DOB: [birth year] DOA: 07/10/2024 PCP: Paseda, Folashade R, [name]   Brief Narrative:  This 58 yrs old Male nursing home resident with medical history significant for being bedbound due to previous stroke.  He has contractures in his lower extremities.  The patient was admitted a month ago with another new stroke.  He also has a history of intracranial hemorrhage so he is not a candidate for Coumadin.  He was on Eliquis  prior to this previous stroke and is now on Xarelto.  He is suspected to have antiphospholipid syndrome.  Patient was found to be less responsive and has decreased oral intake today.  He was found to be tachycardic in the ED, blood pressure noted well 108/78.  Sepsis is suspected so far no source has been found on his workup.  He does have a pressure ulcer on his lateral ankle of right foot.  He screams when his leg is moved.  Plain x-ray of ankle shows no abnormality.  Patient was admitted for sepsis due to unknown source.  Started on empiric antibiotics.  Assessment & Plan:   Principal Problem:   Sepsis (HCC) Active Problems:   PVD (peripheral vascular disease) (HCC)   Bedbound   PAD (peripheral artery disease) (HCC)   Acute stroke due to ischemia (HCC)   Sepsis of unclear etiology : UA is unremarkable. He has a ureteral stone but it is not causing obstruction and UA is normal.   CT of the chest reveals no evidence of infection.   CT of abdomen pelvis reveals a nonocclusive DVT in the external iliac vein first identified in 06/2024, but no evidence of infection. There is ill-defined edema over the left ischial tuberosity. Blood cultures  > No growth so far Initiated on empiric IV Zosyn . The patient was already on Xarelto for history of DVT but has this large new DVT so he has been placed on IV heparin . He will require sedation to examine his heel and backside.   His ankle x-ray did not reveal significant swelling or injury.  Mild edema of the ischium may be a budding decubitus.   Monitor for signs of infection.   Non obstructing renal stones: Urology recommended monitoring.   Lower IVC DVT : the clot occurred while he was on Eliquis .  He was switched to Xarelto.   Coumadin is felt to be too high risk because of his history of intracranial hemorrhage.  Lovenox  may be better protection. Consider hematology consult.  The patient has been started on IV heparin  in the emergency department. Vascular surgery consulted awaiting recommendation. Hematology oncology is also consulted for anticoagulation.   DVT prophylaxis: IV Heparin  Code Status: Full code Family Communication: No family at bed side. Disposition Plan:    Status is: Inpatient Remains inpatient appropriate because: Severity of illness.   Consultants:  Vascular surgery Oncology  Procedures: CT A/P  Antimicrobials:  Anti-infectives (From admission, onward)    Start     Dose/Rate Route Frequency Ordered Stop   07/11/24 1730  piperacillin -tazobactam (ZOSYN ) IVPB 3.375 g        3.375 g 12.5 mL/hr over 240 Minutes Intravenous Every 8 hours 07/11/24 1643     07/10/24 1415  piperacillin -tazobactam (ZOSYN ) IVPB 3.375 g        3.375 g 100 mL/hr over 30 Minutes Intravenous  Once 07/10/24 1407 07/10/24 1658   07/10/24 1415  vancomycin  (VANCOREADY) IVPB 1250 mg/250 mL  1,250 mg 166.7 mL/hr over 90 Minutes Intravenous  Once 07/10/24 1411 07/10/24 2013       Subjective: Patient was seen and examined at bedside. Overnight events noted. Patient reports feeling very weak and tired,  appears deconditioned, has contractures in the lower extremity.  Objective: Vitals:   07/11/24 1614 07/11/24 2048 07/12/24 0417 07/12/24 0815  BP: (!) 106/90 113/72 127/74 105/66  Pulse: (!) 109 (!) 109 95 95  Resp: 20 20 19 18   Temp: 98.7 F (37.1 C) 98.6 F (37 C) 97.8 F (36.6 C) 98 F (36.7 C)  TempSrc: Oral Oral Oral Oral  SpO2: 100% 100% 100% 100%   Weight:      Height:        Intake/Output Summary (Last 24 hours) at 07/12/2024 1318 Last data filed at 07/12/2024 1035 Gross per 24 hour  Intake 964.91 ml  Output --  Net 964.91 ml   Filed Weights   07/10/24 1357  Weight: 59 kg    Examination:  General exam: Appears calm and comfortable, deconditioned, not in any acute distress. Respiratory system: CTA Bilaterally. Respiratory effort normal.  RR 14 Cardiovascular system: S1 & S2 heard, RRR. No JVD, murmurs, rubs, gallops or clicks.  Gastrointestinal system: Abdomen is non distended, soft and non tender. Normal bowel sounds heard. Central nervous system: Alert and oriented x 3. No focal neurological deficits. Extremities: Contractures in the lower extremities. Skin: No rashes, lesions or ulcers Psychiatry: Judgement and insight appear normal. Mood & affect appropriate.   Data Reviewed: I have personally reviewed following labs and imaging studies  CBC: Recent Labs  Lab 07/10/24 2007 07/11/24 0441 07/12/24 0026  WBC 14.4* 12.7* 12.2*  NEUTROABS 11.7*  --   --   HGB 13.5 11.5* 11.6*  HCT 42.3 34.7* 34.0*  MCV 85.1 82.6 81.3  PLT 449* 422* 423*   Basic Metabolic Panel: Recent Labs  Lab 07/10/24 1539 07/11/24 0441  NA 132* 134*  K 5.4* 3.6  CL 98 101  CO2 22 21*  GLUCOSE 96 108*  BUN 16 11  CREATININE 1.11 0.79  CALCIUM  9.0 8.3*   GFR: Estimated Creatinine Clearance: 81.9 mL/min (by C-G formula based on SCr of 0.79 mg/dL). Liver Function Tests: Recent Labs  Lab 07/10/24 1539  AST 52*  ALT 37  ALKPHOS 97  BILITOT 0.5  PROT 7.7  ALBUMIN 2.3*   No results for input(s): LIPASE, AMYLASE in the last 168 hours. No results for input(s): AMMONIA in the last 168 hours. Coagulation Profile: Recent Labs  Lab 07/10/24 1539  INR 2.2*   Cardiac Enzymes: No results for input(s): CKTOTAL, CKMB, CKMBINDEX, TROPONINI in the last 168 hours. BNP (last 3 results) No results for input(s): PROBNP in  the last 8760 hours. HbA1C: No results for input(s): HGBA1C in the last 72 hours. CBG: No results for input(s): GLUCAP in the last 168 hours. Lipid Profile: No results for input(s): CHOL, HDL, LDLCALC, TRIG, CHOLHDL, LDLDIRECT in the last 72 hours. Thyroid  Function Tests: No results for input(s): TSH, T4TOTAL, FREET4, T3FREE, THYROIDAB in the last 72 hours. Anemia Panel: No results for input(s): VITAMINB12, FOLATE, FERRITIN, TIBC, IRON, RETICCTPCT in the last 72 hours. Sepsis Labs: Recent Labs  Lab 07/10/24 1610 07/12/24 0832  LATICACIDVEN 2.5* 1.3    Recent Results (from the past 240 hours)  Blood Culture (routine x 2)     Status: None (Preliminary result)   Collection Time: 07/10/24  3:39 PM   Specimen: BLOOD LEFT FOREARM  Result Value  Ref Range Status   Specimen Description BLOOD LEFT FOREARM  Final   Special Requests   Final    BOTTLES DRAWN AEROBIC AND ANAEROBIC Blood Culture results may not be optimal due to an inadequate volume of blood received in culture bottles   Culture   Final    NO GROWTH 2 DAYS Performed at [facility], [address], [place], [place] [address]    Report Status PENDING  Incomplete    Radiology Studies: DG Knee 2 Views Right Result Date: 07/10/2024 CLINICAL DATA:  Sepsis EXAM: RIGHT KNEE - 1-2 VIEW COMPARISON:  None Available. FINDINGS: Limited study with the knee imaged in a flexed position. Patient was unable to straighten leg. No visible fracture, subluxation or dislocation. No visible joint effusion. IMPRESSION: No visible acute bony abnormality. Electronically Signed   By: Franky Crease M.D.   On: 07/10/2024 20:12   DG Ankle 2 Views Right Result Date: 07/10/2024 CLINICAL DATA:  Sepsis EXAM: RIGHT ANKLE - 2 VIEW COMPARISON:  None Available. FINDINGS: There is no evidence of fracture, dislocation, or joint effusion. There is no evidence of arthropathy or other focal bone abnormality. Soft tissues are  unremarkable. IMPRESSION: Negative. Electronically Signed   By: Franky Crease M.D.   On: 07/10/2024 20:11   CT CHEST ABDOMEN PELVIS W CONTRAST Result Date: 07/10/2024 CLINICAL DATA:  Sepsis. EXAM: CT CHEST, ABDOMEN, AND PELVIS WITH CONTRAST TECHNIQUE: Multidetector CT imaging of the chest, abdomen and pelvis was performed following the standard protocol during bolus administration of intravenous contrast. RADIATION DOSE REDUCTION: This exam was performed according to the departmental dose-optimization program which includes automated exposure control, adjustment of the mA and/or kV according to patient size and/or use of iterative reconstruction technique. CONTRAST:  75mL OMNIPAQUE  IOHEXOL  350 MG/ML SOLN COMPARISON:  CT abdomen pelvis dated 06/18/2024. FINDINGS: Evaluation of this exam is limited due to respiratory motion as well as streak artifact caused by patient's arms. CT CHEST FINDINGS Cardiovascular: There is no cardiomegaly or pericardial effusion. Mild atherosclerotic calcification of the thoracic aorta. No aneurysmal dilatation or dissection. The origins of the great vessels of the aortic arch and the central pulmonary arteries are patent. Mediastinum/Nodes: No hilar or mediastinal adenopathy. The esophagus and the thyroid  gland are grossly unremarkable. No mediastinal fluid collection. Lungs/Pleura: Background of emphysema and chronic interstitial coarsening. Bilateral linear atelectasis/scarring. No focal consolidation, pleural effusion or pneumothorax. The central airways are patent. Musculoskeletal: No acute osseous pathology CT ABDOMEN PELVIS FINDINGS No intra-abdominal free air or free fluid. Hepatobiliary: The liver is unremarkable. No biliary dilatation. The gallbladder is unremarkable. Pancreas: Unremarkable. No pancreatic ductal dilatation or surrounding inflammatory changes. Spleen: Normal in size without focal abnormality. Adrenals/Urinary Tract: The adrenal glands unremarkable. Small  nonobstructing bilateral renal calculi measure up to 5 mm. There is no hydronephrosis on either side. There is symmetric enhancement and excretion of contrast by both kidneys. There is a 5 mm stone in the proximal left ureter. The visualized right ureter and urinary bladder unremarkable. Stomach/Bowel: There is diffuse gaseous distension of the stomach. No mechanical obstruction. The small bowel demonstrates a normal caliber. The appendix is normal. Vascular/Lymphatic: Mild aortoiliac atherosclerotic disease. There is nonocclusive thrombus extending from the left external iliac vein into the lower IVC. No portal venous gas. There is no adenopathy. Reproductive: The prostate and seminal vesicles are grossly remarkable. Other: Faint area of ill-defined density in the soft tissues posterior to the left ischial tuberosity (coronal 27/6) may represent an area of edema. No  drainable fluid collection at this time. Musculoskeletal: No acute osseous pathology. IMPRESSION: 1. No acute intrathoracic pathology. 2. Nonocclusive thrombus extending from the left external iliac vein into the lower IVC. 3. A 5 mm proximal left ureteral stone. No hydronephrosis. 4. Small nonobstructing bilateral renal calculi. 5. Faint area of ill-defined density in the soft tissues posterior to the left ischial tuberosity may represent an area of edema. No drainable fluid collection at this time. 6. Aortic Atherosclerosis (ICD10-I70.0) and Emphysema (ICD10-J43.9). These results were called by telephone at the time of interpretation on 07/10/2024 at 7:21 pm to provider BERNARDINO FIREMAN , who verbally acknowledged these results. Electronically Signed   By: Vanetta Chou M.D.   On: 07/10/2024 19:37   CT Head Wo Contrast Result Date: 07/10/2024 EXAM: CT HEAD WITHOUT CONTRAST 07/10/2024 06:58:07 PM TECHNIQUE: CT of the head was performed without the administration of intravenous contrast. Automated exposure control, iterative reconstruction, and/or weight  based adjustment of the mA/kV was utilized to reduce the radiation dose to as low as reasonably achievable. COMPARISON: 06/18/2024 CLINICAL HISTORY: Mental status change, unknown cause. FINDINGS: BRAIN AND VENTRICLES: No acute hemorrhage. No evidence of acute infarct. No hydrocephalus. No extra-axial collection. No mass effect or midline shift. Stable extensive chronic ischemic changes are seen throughout the bilateral subcortical and periventricular white matter. Similar bilateral parietal occipital and bifrontal encephalomalacia. ORBITS: No acute abnormality. SINUSES: No acute abnormality. SOFT TISSUES AND SKULL: No acute soft tissue abnormality. No skull fracture. IMPRESSION: 1. No acute intracranial abnormality. 2. Stable extensive chronic ischemic changes throughout the bilateral subcortical and periventricular white matter, as well as bilateral parietal occipital and bifrontal encephalomalacia. Electronically signed by: Norman Gatlin MD 07/10/2024 07:23 PM EDT RP Workstation: [id number]   DG Chest Port 1 View Result Date: 07/10/2024 CLINICAL DATA:  Questionable sepsis. EXAM: PORTABLE CHEST 1 VIEW COMPARISON:  Chest radiograph dated 06/18/2024. FINDINGS: Bibasilar atelectasis/scarring. No focal consolidation, pleural effusion or pneumothorax. The cardiac silhouette is within normal limits. Loop recorder device. No acute osseous pathology. IMPRESSION: No active disease. Electronically Signed   By: Vanetta Chou M.D.   On: 07/10/2024 14:46   Scheduled Meds: Continuous Infusions:  heparin  1,100 Units/hr (07/12/24 1103)   piperacillin -tazobactam (ZOSYN )  IV 3.375 g (07/12/24 0626)     LOS: 2 days    Time spent: 35 mins    Darcel Dawley, MD [facility]   If 7PM-7AM, please contact night-coverage

## 2024-07-12 NOTE — Consult Note (Addendum)
 Hospital Consult    Reason for Consult:  IVC thrombus Requesting Physician:  [facility] MRN #:  [id number]  History of Present Illness: This is a 59 y.o. male with complex past medical history being seen in consultation for evaluation of chronic IVC thrombus.  He is somewhat alert but not oriented with no family members present on exam.  He denies any pain.  Imaging demonstrates IVC thrombus which has been present since May of this year.  He was seen by [facility] during a previous admission and started on DOAC.  He is being treated for sepsis of unclear etiology.  Past Medical History:  Diagnosis Date   Anxiety    Arrhythmia as indication for cardiac pacemaker replacement 03/2020   Frequent falls 03/2020   History of loop recorder 04/2017   ICH (intracerebral hemorrhage) (HCC) 06/19/2023   Stroke (cerebrum) (HCC)    Tobacco use    Urinary frequency 09/2019   Vitamin D  deficiency     Past Surgical History:  Procedure Laterality Date   FOOT SURGERY     LOOP RECORDER INSERTION N/A 04/13/2017   Procedure: Loop Recorder Insertion;  Surgeon: Waddell Danelle ORN, MD;  Location: [facility] INVASIVE CV LAB;  Service: Cardiovascular;  Laterality: N/A;   TEE WITHOUT CARDIOVERSION N/A 04/13/2017   Procedure: TRANSESOPHAGEAL ECHOCARDIOGRAM (TEE);  Surgeon: Jeffrie Oneil BROCKS, MD;  Location: [facility] ENDOSCOPY;  Service: Cardiovascular;  Laterality: N/A;    No Known Allergies  Prior to Admission medications   Medication Sig Start Date End Date Taking? Authorizing Provider  acetaminophen  (TYLENOL ) 325 MG tablet Take 2 tablets (650 mg total) by mouth every 6 (six) hours as needed for mild pain, moderate pain, fever or headache (or temp > 37.5 C (99.5 F)). 06/22/23  Yes Hongalgi, Trenda BIRCH, MD  apixaban  (ELIQUIS ) 5 MG TABS tablet Take 1 tablet (5 mg total) by mouth 2 (two) times daily. 05/10/24  Yes Ghimire, Donalda HERO, MD  bisacodyl  (DULCOLAX) 10 MG suppository Place 1 suppository (10 mg total) rectally daily as needed for mild  constipation. 04/18/24  Yes Rai, Ripudeep K, MD  Cholecalciferol  25 MCG (1000 UT) tablet Take 1,000 Units by mouth every morning.   Yes [provider]  cyanocobalamin  1000 MCG tablet Take 1 tablet (1,000 mcg total) by mouth daily. 09/11/22  Yes Antoinette Doe, MD  feeding supplement (ENSURE ENLIVE / ENSURE PLUS) LIQD Take 237 mLs by mouth 2 (two) times daily between meals. Patient taking differently: Take 237 mLs by mouth 3 (three) times daily between meals. Chocolate if available 03/02/24  Yes Tobie Yetta HERO, MD  gabapentin  (NEURONTIN ) 300 MG capsule TAKE 1 CAPSULE BY MOUTH BY MOUTH TWO TIMES DAILY Patient taking differently: Take 300 mg by mouth 2 (two) times daily. 04/22/22 07/10/24 Yes Jaffe, Adam R, DO  ipratropium-albuterol  (DUONEB) 0.5-2.5 (3) MG/3ML SOLN Take 3 mLs by nebulization every 6 (six) hours as needed (SOB, wheezing).   Yes [provider]  ketoconazole (NIZORAL) 2 % shampoo Apply 1 Application topically 2 (two) times a week. Monday and Thursday   Yes [provider]  Lactobacillus (ACIDOPHILUS) 100 MG CAPS Take 200 mg by mouth in the morning.   Yes [provider]  liver oil-zinc  oxide (DESITIN) 40 % ointment Apply 1 Application topically as needed for irritation (With each incontinent episode daily).   Yes [provider]  magnesium  hydroxide (MILK OF MAGNESIA) 400 MG/5ML suspension Take 30 mLs by mouth daily as needed for mild constipation.   Yes [provider]  nutrition supplement, JUVEN, (JUVEN) PACK Take 1 packet by mouth 2 (two) times daily between meals. 03/09/24  Yes Patsy Lenis, MD  ondansetron  (ZOFRAN ) 4 MG tablet Take 1 tablet (4 mg total) by mouth every 6 (six) hours as needed for nausea. 04/18/24  Yes Rai, Ripudeep K, MD  pantoprazole  (PROTONIX ) 40 MG tablet Take 1 tablet (40 mg total) by mouth at bedtime. 06/22/23  Yes Hongalgi, Anand D, MD  polyethylene glycol (MIRALAX  / GLYCOLAX ) 17 g packet Take 17 g by mouth 2  (two) times daily. 03/09/24  Yes Patsy Lenis, MD  potassium chloride  (KLOR-CON ) 20 MEQ packet Take 40 mEq by mouth 3 (three) times daily. Patient taking differently: Take 40 mEq by mouth every 8 (eight) hours. 06/21/24  Yes Ghimire, Renato, MD  senna-docusate (SENOKOT-S) 8.6-50 MG tablet Take 1 tablet by mouth at bedtime. 04/18/24  Yes Rai, Ripudeep K, MD  simethicone  (MYLICON) 80 MG chewable tablet Chew 1 tablet (80 mg total) by mouth 4 (four) times daily -  before meals and at bedtime. Patient taking differently: Chew 80 mg by mouth 4 (four) times daily. 03/02/24  Yes Tobie Yetta HERO, MD  sodium bicarbonate  650 MG tablet Take 650 mg by mouth 2 (two) times daily.   Yes [provider]  sodium phosphate (FLEET) ENEM Place 1 enema rectally every other day.   Yes [provider]  Sodium Phosphates  (FLEET SALINE ENEMA RE) Place 1 application  rectally daily as needed.   Yes [provider]    Social History   Socioeconomic History   Marital status: Legally Separated    Spouse name: Not on file   Number of children: 1   Years of education: Not on file   Highest education level: Some college, no degree  Occupational History   Occupation: disabled  Tobacco Use   Smoking status: Every Day    Current packs/day: 1.00    Average packs/day: 1 pack/day for 46.7 years (46.7 ttl pk-yrs)    Types: Cigarettes    Start date: 11/01/1977   Smokeless tobacco: Never  Vaping Use   Vaping status: Never Used  Substance and Sexual Activity   Alcohol use: No   Drug use: No   Sexual activity: Not Currently    Partners: Female  Other Topics Concern   Not on file  Social History Narrative   Patient is right-handed. He lives in a one level home alone  since. He drinks one cup of coffee and 2-3 sodas a day.   Social Drivers of Health   Financial Resource Strain: Medium Risk (07/31/2021)   Overall Financial Resource Strain (CARDIA)    Difficulty of Paying Living Expenses: Somewhat  hard  Food Insecurity: No Food Insecurity (07/11/2024)   Hunger Vital Sign    Worried About Running Out of Food in the Last Year: Never true    Ran Out of Food in the Last Year: Never true  Transportation Needs: No Transportation Needs (07/11/2024)   PRAPARE - Administrator, Civil Service (Medical): No    Lack of Transportation (Non-Medical): No  Physical Activity: Inactive (07/31/2021)   Exercise Vital Sign    Days of Exercise per Week: 0 days    Minutes of Exercise per Session: 0 min  Stress: No Stress Concern Present (07/31/2021)   [employer] of Occupational Health - Occupational Stress Questionnaire    Feeling of Stress : Not at all  Social Connections: Socially Isolated (07/31/2021)   Social Connection and Isolation Panel  Frequency of Communication with Friends and Family: Three times a week    Frequency of Social Gatherings with Friends and Family: Never    Attends Religious Services: Never    Database administrator or Organizations: No    Attends Banker Meetings: Never    Marital Status: Separated  Intimate Partner Violence: Unknown (07/11/2024)   Humiliation, Afraid, Rape, and Kick questionnaire    Fear of Current or Ex-Partner: No    Emotionally Abused: No    Physically Abused: Not on file    Sexually Abused: No    Family History  Problem Relation Age of Onset   Diabetes Mother    COPD Mother    Diabetes Father    Heart failure Father    Cancer Sister     ROS: Otherwise negative unless mentioned in HPI  Physical Examination  Vitals:   07/12/24 0417 07/12/24 0815  BP: 127/74 105/66  Pulse: 95 95  Resp: 19 18  Temp: 97.8 F (36.6 C) 98 F (36.7 C)  SpO2: 100% 100%   Body mass index is 20.99 kg/m.  General:  WDWN in NAD Gait: Not observed HENT: WNL, normocephalic Pulmonary: normal non-labored breathing Cardiac: regular Abdomen:  soft, NT/ND, no masses Skin: without rashes Vascular Exam/Pulses: palpable R PT and L  DP Extremities: Contracted hips and knees bilaterally; bilateral lower extremities without significant edema Musculoskeletal: no muscle wasting or atrophy  Neurologic: A&O X 3;  No focal weakness or paresthesias are detected; speech is fluent/normal Psychiatric:  The pt has Normal affect. Lymph:  Unremarkable  CBC    Component Value Date/Time   WBC 12.2 (H) 07/12/2024 0026   RBC 4.18 (L) 07/12/2024 0026   HGB 11.6 (L) 07/12/2024 0026   HGB 15.3 06/11/2020 1041   HCT 34.0 (L) 07/12/2024 0026   HCT 44.9 06/11/2020 1041   PLT 423 (H) 07/12/2024 0026   PLT 230 06/11/2020 1041   MCV 81.3 07/12/2024 0026   MCV 93 06/11/2020 1041   MCH 27.8 07/12/2024 0026   MCHC 34.1 07/12/2024 0026   RDW 13.7 07/12/2024 0026   RDW 13.8 06/11/2020 1041   LYMPHSABS 1.6 07/10/2024 2007   LYMPHSABS 1.7 06/11/2020 1041   MONOABS 0.9 07/10/2024 2007   EOSABS 0.0 07/10/2024 2007   EOSABS 0.1 06/11/2020 1041   BASOSABS 0.0 07/10/2024 2007   BASOSABS 0.0 06/11/2020 1041    BMET    Component Value Date/Time   NA 134 (L) 07/11/2024 0441   NA 140 01/11/2022 1121   K 3.6 07/11/2024 0441   CL 101 07/11/2024 0441   CO2 21 (L) 07/11/2024 0441   GLUCOSE 108 (H) 07/11/2024 0441   BUN 11 07/11/2024 0441   BUN 5 (L) 01/11/2022 1121   CREATININE 0.79 07/11/2024 0441   CREATININE 1.24 04/18/2017 1012   CALCIUM  8.3 (L) 07/11/2024 0441   GFRNONAA >60 07/11/2024 0441   GFRNONAA 66 04/18/2017 1012   GFRAA 80 06/11/2020 1041   GFRAA 76 04/18/2017 1012    COAGS: Lab Results  Component Value Date   INR 2.2 (H) 07/10/2024   INR 1.3 (H) 06/19/2024   INR 1.2 06/19/2023     Non-Invasive Vascular Imaging:   CTA abdomen pelvis with nonocclusive thrombus extending from the left external iliac vein into the lower IVC   ASSESSMENT/PLAN: This is a 58 y.o. male with known extensive left lower extremity DVT extending into the lower IVC  Mr. Loewe is a 58 year old male with complex medical history including  CVA.  He has been admitted for treatment of sepsis of unknown etiology.  He was seen during a previous hospital admission for chronic left lower extremity DVT extending into the IVC.  CT was repeated during this admission and again demonstrates the thrombus.  Given his clinical picture, nonambulatory status, and comorbidities he is not a candidate for any invasive procedures.  Continue DOAC if tolerable.  Fortunately bilateral lower extremities continue to be without any significant edema.  Nothing to offer from a vascular surgery standpoint.  On-call vascular surgeon Dr. Pearline will evaluate the patient later today and provide further treatment plans.   Donnice Sender PA-C [facility] [phone]   VASCULAR STAFF ADDENDUM: I have independently interviewed and examined the patient. I agree with the above.  Patient has a chronic left lower extremity and left iliac DVT with a small tunnel thrombus extending into the distal IVC. He was seen by Dr. Lanis in July and no vascular intervention was indicated given that he is asymptomatic, bedbound with contractures. A CT from 07/10/2024 was reviewed.  He has a similar appearing DVT of the left iliac with slight extension into the distal IVC.  At this point this clot is chronic and has very little risk of embolization.  Will plan to continue with Xarelto for management as he is not a candidate for vascular intervention and there is no indication for thrombectomy at this point.  Norman GORMAN Pearline MD [facility] Phone Number: [phone] 07/12/2024 2:45 PM

## 2024-07-13 ENCOUNTER — Ambulatory Visit: Admitting: Physician Assistant

## 2024-07-13 ENCOUNTER — Telehealth (HOSPITAL_COMMUNITY): Payer: Self-pay | Admitting: Pharmacy Technician

## 2024-07-13 ENCOUNTER — Other Ambulatory Visit (HOSPITAL_COMMUNITY): Payer: Self-pay

## 2024-07-13 DIAGNOSIS — A419 Sepsis, unspecified organism: Secondary | ICD-10-CM | POA: Diagnosis not present

## 2024-07-13 LAB — CBC
HCT: 30.8 % — ABNORMAL LOW (ref 39.0–52.0)
Hemoglobin: 10.4 g/dL — ABNORMAL LOW (ref 13.0–17.0)
MCH: 26.9 pg (ref 26.0–34.0)
MCHC: 33.8 g/dL (ref 30.0–36.0)
MCV: 79.8 fL — ABNORMAL LOW (ref 80.0–100.0)
Platelets: 457 K/uL — ABNORMAL HIGH (ref 150–400)
RBC: 3.86 MIL/uL — ABNORMAL LOW (ref 4.22–5.81)
RDW: 13.5 % (ref 11.5–15.5)
WBC: 8.4 K/uL (ref 4.0–10.5)
nRBC: 0 % (ref 0.0–0.2)

## 2024-07-13 LAB — APTT: aPTT: 97 s — ABNORMAL HIGH (ref 24–36)

## 2024-07-13 LAB — HEPARIN LEVEL (UNFRACTIONATED): Heparin Unfractionated: 0.81 [IU]/mL — ABNORMAL HIGH (ref 0.30–0.70)

## 2024-07-13 MED ORDER — ENOXAPARIN SODIUM 60 MG/0.6ML IJ SOSY
60.0000 mg | PREFILLED_SYRINGE | Freq: Two times a day (BID) | INTRAMUSCULAR | Status: DC
  Administered 2024-07-13 – 2024-07-16 (×6): 60 mg via SUBCUTANEOUS
  Filled 2024-07-13 (×6): qty 0.6

## 2024-07-13 NOTE — Progress Notes (Signed)
 PHARMACY - ANTICOAGULATION CONSULT NOTE  Pharmacy Consult for Enoxaparin  Indication: DVT  No Known Allergies  Patient Measurements: Height: 5' 6 (167.6 cm) Weight: 59 kg (130 lb 1.1 oz) IBW/kg (Calculated) : 63.8 HEPARIN  DW (KG): 59  Vital Signs: Temp: 97.9 F (36.6 C) (09/12 1224) Temp Source: Oral (09/12 1224) BP: 125/90 (09/12 1224) Pulse Rate: 92 (09/12 1224)  Labs: Recent Labs    07/10/24 1539 07/10/24 2007 07/11/24 0441 07/11/24 1523 07/12/24 0026 07/12/24 0832 07/13/24 0559  HGB  --    < > 11.5*  --  11.6*  --  10.4*  HCT  --    < > 34.7*  --  34.0*  --  30.8*  PLT  --    < > 422*  --  423*  --  457*  APTT  --    < > 60* 59* 114* 96* 97*  LABPROT 25.2*  --   --   --   --   --   --   INR 2.2*  --   --   --   --   --   --   HEPARINUNFRC  --    < > >1.10* >1.10* >1.10*  --  0.81*  CREATININE 1.11  --  0.79  --   --   --   --    < > = values in this interval not displayed.    Estimated Creatinine Clearance: 81.9 mL/min (by C-G formula based on SCr of 0.79 mg/dL).  Assessment: Palliative pt with terminal chronic cerebrovascular disease. Pt on PTA apixaban  and last dose on 9/9 at 8 AM. Will utilize heparin  for now and consider restarting apixaban  when stable.  Pharmacy consulted to transition to enoxaparin  per recommendation from Heme-Onc.   Goal of Therapy:  Anti-Xa level 0.6-1 units/ml 4hrs after LMWH dose given Monitor platelets by anticoagulation protocol: Yes   Plan:  Stop heparin  infusion just prior to 1st enoxaparin  dose Enoxaparin  60 mg SQ q12h CBC q72h Monitor for s/sx of bleeding  Thank you for involving pharmacy in this patient's care.  Delon Sax, PharmD, BCPS Clinical Pharmacist Clinical phone for 07/13/2024 is [phone] 07/13/2024 2:38 PM

## 2024-07-13 NOTE — Plan of Care (Signed)

## 2024-07-13 NOTE — Telephone Encounter (Signed)
 Patient Product/process development scientist completed.    The patient is insured through [employer]. Patient has Medicare and is not eligible for a copay card, but may be able to apply for patient assistance or Medicare RX Payment Plan (Patient Must reach out to their plan, if eligible for payment plan), if available.    Ran test claim for enoxaparin  (Lovenox ) 60 mg/0.6 ml and the current 30 day co-pay is $0.00.   This test claim was processed through [facility]- copay amounts may vary at other pharmacies due to pharmacy/plan contracts, or as the patient moves through the different stages of their insurance plan.     Reyes Sharps, CPHT Pharmacy Technician III Certified Patient Advocate [facility] Patient Advocate Team Direct Number: [phone]  Fax: [phone]

## 2024-07-13 NOTE — Progress Notes (Signed)
 PHARMACY - ANTICOAGULATION CONSULT NOTE  Pharmacy Consult for Heparin  Indication: DVT  No Known Allergies  Patient Measurements: Height: 5' 6 (167.6 cm) Weight: 59 kg (130 lb 1.1 oz) IBW/kg (Calculated) : 63.8 HEPARIN  DW (KG): 59  Vital Signs: Temp: 99 F (37.2 C) (09/12 0535) BP: 101/68 (09/12 0535) Pulse Rate: 96 (09/12 0535)  Labs: Recent Labs    07/10/24 1539 07/10/24 2007 07/11/24 0441 07/11/24 1523 07/12/24 0026 07/12/24 0832 07/13/24 0559  HGB  --    < > 11.5*  --  11.6*  --  10.4*  HCT  --    < > 34.7*  --  34.0*  --  30.8*  PLT  --    < > 422*  --  423*  --  457*  APTT  --    < > 60* 59* 114* 96* 97*  LABPROT 25.2*  --   --   --   --   --   --   INR 2.2*  --   --   --   --   --   --   HEPARINUNFRC  --    < > >1.10* >1.10* >1.10*  --  0.81*  CREATININE 1.11  --  0.79  --   --   --   --    < > = values in this interval not displayed.    Estimated Creatinine Clearance: 81.9 mL/min (by C-G formula based on SCr of 0.79 mg/dL).   Medical History: Past Medical History:  Diagnosis Date   Anxiety    Arrhythmia as indication for cardiac pacemaker replacement 03/2020   Frequent falls 03/2020   History of loop recorder 04/2017   ICH (intracerebral hemorrhage) (HCC) 06/19/2023   Stroke (cerebrum) (HCC)    Tobacco use    Urinary frequency 09/2019   Vitamin D  deficiency     Assessment: Palliative pt with terminal chronic cerebrovascular disease. Pt on PTA apixaban  and last dose on 9/9 at 8 AM. Will utilize heparin  for now and consider restarting apixaban  when stable.  aPTT therapeutic at 1100 units/hr, anti-Xa level elevated as expected with last apixaban  dose. Per RN, no signs/symptoms of bleeding. Hgb low, stable  Goal of Therapy:  Heparin  level 0.3-0.7 units/ml aPTT 66-102 seconds Monitor platelets by anticoagulation protocol: Yes   Plan:  Decrease empirically to heparin  infusion 1050 units/hr Check heparin  level and aPTT daily while on  heparin  Continue to monitor H&H and platelets  Thank you for allowing pharmacy to be a part of this patient's care.  Shelba Collier, PharmD, BCPS Clinical Pharmacist

## 2024-07-13 NOTE — Progress Notes (Signed)
 [facility] 7T81 [facility] Patient  [name] is a current [facility] patient with a terminal diagnosis of cerebrovascular disease who resides at [facility]. Patient was recently hospitalized with treatment for pseudo ileus and since return to the facility has not been doing well. He was found to have low BP and high heart rate and family made choice for him to be evaluated. He has chosen to remain a full code. He was admitted on 9.9.25 with diagnosis of Sepsis and per Dr. Norleen Stuart this is a related hospital admission.   Visited patient bedside.  He is alert and responsive. He had left arm pain with movement. New contact precautions for MRSA.  IV heparin  infusing. Patient is inpatient appropriate with need for labs/diagnostics and IVAB.  Vital Signs- 98.2/87/18   106/78   sp02 100% on room air  I&O- +240  Abnormal Labs- Hgb 10.4, HCT 30.8, Platelets 457, APTT 97    Diagnostics-  No new imaging on 9.12.25    IV/PRN Meds- Heparin  10.74ml/H continuous IV, Zosyn  3.375g IV q8H, Tylenol  650mg  PO   Current plan as per Daniel Dawley MD 9.12.25 1:55pm  Principal Problem:   Sepsis (HCC)  Active Problems:   PVD (peripheral vascular disease) (HCC)   Bedbound   PAD (peripheral artery disease) (HCC)   Acute stroke due to ischemia (HCC)  Sepsis of unclear etiology : UA is unremarkable. He has a ureteral stone but it is not causing obstruction and UA is normal.   CT of the chest reveals no evidence of infection.   CT of abdomen pelvis reveals a nonocclusive DVT in the external iliac vein first identified in 06/2024, but no evidence of infection. There is ill-defined edema over the left ischial tuberosity. Blood cultures  > No growth so far, urine cultures no growth. Initiated on empiric IV Zosyn . The patient was already on Xarelto for history of DVT but has this large new DVT so he has been placed on IV heparin . He will require sedation to examine his heel and  backside.   His ankle x-ray did not reveal significant swelling or injury. Mild edema of the ischium may be a budding decubitus.   Monitor for signs of infection.  Sepsis physiology resolved.  If no fever or white cell count by tomorrow discontinue antibiotics.  Non obstructing renal stones: Urology recommended monitoring.  Lower IVC DVT : the clot occurred while he was on Eliquis .  He was switched to Xarelto.   Coumadin is felt to be too high risk because of his history of intracranial hemorrhage.  Lovenox  may be better protection. The patient has been started on IV heparin  in the emergency department. 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 Hematology oncology was consulted.  Dr. Autumn recommended continuing Lovenox  and follow-up outpatient. Will inform TOC to arrange Lovenox .  Discharge Planning- ongoing, likely once medically optimized  Family Contact - spoke with Daniel Stuart who was asking about respite care.  Explained that respite is not available for patients who are in facilities.  We discussed her concerns with current facility and I relayed them to her hospice team via email.  IDT: updated  Goals of Care: Full  Thank you for the opportunity to participate in this patient's care, please don't hesitate to call for any hospice related questions or concerns.   When patient is ready for discharge please use [facility] as we contract this service for our patients.  Daniel Stuart BSN, [facility] Liaison [phone]

## 2024-07-13 NOTE — Progress Notes (Signed)
 PROGRESS NOTE    CAP MASSI  FMW:[id number] DOB: [birth year] DOA: 07/10/2024 PCP: Paseda, Folashade R, [name]   Brief Narrative:  This 58 yrs old Male nursing home resident with medical history significant for being bedbound due to previous stroke.  He has contractures in his lower extremities.  The patient was admitted a month ago with another new stroke.  He also has a history of intracranial hemorrhage so he is not a candidate for Coumadin.  He was on Eliquis  prior to this previous stroke and is now on Xarelto.  He is suspected to have antiphospholipid syndrome.  Patient was found to be less responsive and has decreased oral intake today.  He was found to be tachycardic in the ED, blood pressure noted well 108/78.  Sepsis is suspected so far no source has been found on his workup.  He does have a pressure ulcer on his lateral ankle of right foot.  He screams when his leg is moved.  Plain x-ray of ankle shows no abnormality.  Patient was admitted for sepsis due to unknown source.  Started on empiric antibiotics.  Assessment & Plan:   Principal Problem:   Sepsis (HCC) Active Problems:   PVD (peripheral vascular disease) (HCC)   Bedbound   PAD (peripheral artery disease) (HCC)   Acute stroke due to ischemia (HCC)   Sepsis of unclear etiology : UA is unremarkable. He has a ureteral stone but it is not causing obstruction and UA is normal.   CT of the chest reveals no evidence of infection.   CT of abdomen pelvis reveals a nonocclusive DVT in the external iliac vein first identified in 06/2024, but no evidence of infection. There is ill-defined edema over the left ischial tuberosity. Blood cultures  > No growth so far, urine cultures no growth. Initiated on empiric IV Zosyn . The patient was already on Xarelto for history of DVT but has this large new DVT so he has been placed on IV heparin . He will require sedation to examine his heel and backside.   His ankle x-ray did not reveal  significant swelling or injury. Mild edema of the ischium may be a budding decubitus.   Monitor for signs of infection.  Sepsis physiology resolved.  If no fever or white cell count by tomorrow discontinue antibiotics.   Non obstructing renal stones: Urology recommended monitoring.   Lower IVC DVT : the clot occurred while he was on Eliquis .  He was switched to Xarelto.   Coumadin is felt to be too high risk because of his history of intracranial hemorrhage.  Lovenox  may be better protection. The patient has been started on IV heparin  in the emergency department. 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 Hematology oncology was consulted.  Dr. Autumn recommended continuing Lovenox  and follow-up outpatient. Will inform TOC to arrange Lovenox .   DVT prophylaxis: IV Heparin  Code Status: Full code Family Communication: No family at bed side. Disposition Plan:    Status is: Inpatient Remains inpatient appropriate because: Severity of illness.   Consultants:  Vascular surgery Oncology  Procedures: CT A/P  Antimicrobials:  Anti-infectives (From admission, onward)    Start     Dose/Rate Route Frequency Ordered Stop   07/11/24 1730  piperacillin -tazobactam (ZOSYN ) IVPB 3.375 g        3.375 g 12.5 mL/hr over 240 Minutes Intravenous Every 8 hours 07/11/24 1643     07/10/24 1415  piperacillin -tazobactam (  ZOSYN ) IVPB 3.375 g        3.375 g 100 mL/hr over 30 Minutes Intravenous  Once 07/10/24 1407 07/10/24 1658   07/10/24 1415  vancomycin  (VANCOREADY) IVPB 1250 mg/250 mL        1,250 mg 166.7 mL/hr over 90 Minutes Intravenous  Once 07/10/24 1411 07/10/24 2013       Subjective: Patient was seen and examined at bedside. Overnight events noted. Patient reports feeling very weak and tired,  appears deconditioned,  He has contractures in the lower extremity.  Objective: Vitals:   07/12/24 2344 07/13/24  0535 07/13/24 0817 07/13/24 1224  BP: 110/62 101/68 106/78 (!) 125/90  Pulse: 95 96 87 92  Resp: 18 19 18 20   Temp: 99 F (37.2 C) 99 F (37.2 C) 98.2 F (36.8 C) 97.9 F (36.6 C)  TempSrc:   Oral Oral  SpO2: 100% 100% 100% 100%  Weight:      Height:        Intake/Output Summary (Last 24 hours) at 07/13/2024 1359 Last data filed at 07/13/2024 1248 Gross per 24 hour  Intake 360 ml  Output --  Net 360 ml   Filed Weights   07/10/24 1357  Weight: 59 kg    Examination:  General exam: Appears calm and comfortable, deconditioned, not in any acute distress. Respiratory system: CTA Bilaterally. Respiratory effort normal.  RR 14 Cardiovascular system: S1 & S2 heard, RRR. No JVD, murmurs, rubs, gallops or clicks.  Gastrointestinal system: Abdomen is non distended, soft and non tender. Normal bowel sounds heard. Central nervous system: Alert and oriented x 3. No focal neurological deficits. Extremities: Contractures in the lower extremities. Skin: No rashes, lesions or ulcers Psychiatry: Judgement and insight appear normal. Mood & affect appropriate.   Data Reviewed: I have personally reviewed following labs and imaging studies  CBC: Recent Labs  Lab 07/10/24 2007 07/11/24 0441 07/12/24 0026 07/13/24 0559  WBC 14.4* 12.7* 12.2* 8.4  NEUTROABS 11.7*  --   --   --   HGB 13.5 11.5* 11.6* 10.4*  HCT 42.3 34.7* 34.0* 30.8*  MCV 85.1 82.6 81.3 79.8*  PLT 449* 422* 423* 457*   Basic Metabolic Panel: Recent Labs  Lab 07/10/24 1539 07/11/24 0441  NA 132* 134*  K 5.4* 3.6  CL 98 101  CO2 22 21*  GLUCOSE 96 108*  BUN 16 11  CREATININE 1.11 0.79  CALCIUM  9.0 8.3*   GFR: Estimated Creatinine Clearance: 81.9 mL/min (by C-G formula based on SCr of 0.79 mg/dL). Liver Function Tests: Recent Labs  Lab 07/10/24 1539  AST 52*  ALT 37  ALKPHOS 97  BILITOT 0.5  PROT 7.7  ALBUMIN 2.3*   No results for input(s): LIPASE, AMYLASE in the last 168 hours. No results for  input(s): AMMONIA in the last 168 hours. Coagulation Profile: Recent Labs  Lab 07/10/24 1539  INR 2.2*   Cardiac Enzymes: No results for input(s): CKTOTAL, CKMB, CKMBINDEX, TROPONINI in the last 168 hours. BNP (last 3 results) No results for input(s): PROBNP in the last 8760 hours. HbA1C: No results for input(s): HGBA1C in the last 72 hours. CBG: No results for input(s): GLUCAP in the last 168 hours. Lipid Profile: No results for input(s): CHOL, HDL, LDLCALC, TRIG, CHOLHDL, LDLDIRECT in the last 72 hours. Thyroid  Function Tests: No results for input(s): TSH, T4TOTAL, FREET4, T3FREE, THYROIDAB in the last 72 hours. Anemia Panel: No results for input(s): VITAMINB12, FOLATE, FERRITIN, TIBC, IRON, RETICCTPCT in the last 72 hours. Sepsis Labs:  Recent Labs  Lab 07/10/24 1610 07/12/24 0832  LATICACIDVEN 2.5* 1.3    Recent Results (from the past 240 hours)  Blood Culture (routine x 2)     Status: None (Preliminary result)   Collection Time: 07/10/24  3:39 PM   Specimen: BLOOD LEFT FOREARM  Result Value Ref Range Status   Specimen Description BLOOD LEFT FOREARM  Final   Special Requests   Final    BOTTLES DRAWN AEROBIC AND ANAEROBIC Blood Culture results may not be optimal due to an inadequate volume of blood received in culture bottles   Culture   Final    NO GROWTH 3 DAYS Performed at [facility], [address], [place], [place] [address]    Report Status PENDING  Incomplete  Urine Culture     Status: None   Collection Time: 07/10/24  4:34 PM   Specimen: Urine, Clean Catch  Result Value Ref Range Status   Specimen Description URINE, CLEAN CATCH  Final   Special Requests NONE  Final   Culture   Final    NO GROWTH Performed at [facility], [address] [place], [place] [address]    Report Status 07/12/2024 FINAL  Final    Radiology Studies: No results found.  Scheduled Meds:  leptospermum manuka honey   1 Application Topical Daily   Continuous Infusions:  heparin  1,050 Units/hr (07/13/24 0904)   piperacillin -tazobactam (ZOSYN )  IV 3.375 g (07/13/24 0856)     LOS: 3 days    Time spent: 35 mins    Darcel Dawley, MD [facility]   If 7PM-7AM, please contact night-coverage

## 2024-07-14 ENCOUNTER — Inpatient Hospital Stay (HOSPITAL_COMMUNITY)

## 2024-07-14 DIAGNOSIS — A419 Sepsis, unspecified organism: Secondary | ICD-10-CM | POA: Diagnosis not present

## 2024-07-14 LAB — BASIC METABOLIC PANEL WITH GFR
Anion gap: 14 (ref 5–15)
BUN: <5 mg/dL — ABNORMAL LOW (ref 6–20)
CO2: 22 mmol/L (ref 22–32)
Calcium: 8.1 mg/dL — ABNORMAL LOW (ref 8.9–10.3)
Chloride: 99 mmol/L (ref 98–111)
Creatinine, Ser: 0.85 mg/dL (ref 0.61–1.24)
GFR, Estimated: >60 mL/min (ref >60)
Glucose, Bld: 92 mg/dL (ref 70–99)
Potassium: 2.8 mmol/L — ABNORMAL LOW (ref 3.5–5.1)
Sodium: 135 mmol/L (ref 135–145)

## 2024-07-14 LAB — MAGNESIUM: Magnesium: 1.8 mg/dL (ref 1.7–2.4)

## 2024-07-14 LAB — PHOSPHORUS: Phosphorus: 2.7 mg/dL (ref 2.5–4.6)

## 2024-07-14 MED ORDER — POTASSIUM CHLORIDE 20 MEQ PO PACK
40.0000 meq | PACK | Freq: Once | ORAL | Status: AC
  Administered 2024-07-14: 40 meq via ORAL
  Filled 2024-07-14: qty 2

## 2024-07-14 MED ORDER — POTASSIUM CHLORIDE 10 MEQ/100ML IV SOLN
10.0000 meq | INTRAVENOUS | Status: AC
  Administered 2024-07-14 (×2): 10 meq via INTRAVENOUS
  Filled 2024-07-14 (×2): qty 100

## 2024-07-14 NOTE — Progress Notes (Signed)
 PROGRESS NOTE    Daniel Stuart  FMW:[id number] DOB: 11-25-1965 DOA: 07/10/2024 PCP: Paseda, Folashade R, [name]   Brief Narrative:  This 58 yrs old Male nursing home resident with medical history significant for being bedbound due to previous stroke.  He has contractures in his lower extremities.  The patient was admitted a month ago with another new stroke.  He also has a history of intracranial hemorrhage so he is not a candidate for Coumadin.  He was on Eliquis  prior to this previous stroke and is now on Xarelto.  He is suspected to have antiphospholipid syndrome.  Patient was found to be less responsive and has decreased oral intake today.  He was found to be tachycardic in the ED, blood pressure noted well 108/78.  Sepsis is suspected so far no source has been found on his workup.  He does have a pressure ulcer on his lateral ankle of right foot.  He screams when his leg is moved.  Plain x-ray of ankle shows no abnormality.  Patient was admitted for sepsis due to unknown source.  Started on empiric antibiotics.  Assessment & Plan:   Principal Problem:   Sepsis (HCC) Active Problems:   PVD (peripheral vascular disease) (HCC)   Bedbound   PAD (peripheral artery disease) (HCC)   Acute stroke due to ischemia (HCC)   Sepsis of unclear etiology : UA is unremarkable. He has a ureteral stone but it is not causing obstruction and UA is normal.   CT of the chest reveals no evidence of infection.   CT of abdomen pelvis reveals a nonocclusive DVT in the external iliac vein first identified in 06/2024, but no evidence of infection. There is ill-defined edema over the left ischial tuberosity. Blood cultures  > No growth so far, urine cultures no growth. Initiated on empiric IV Zosyn . The patient was already on Xarelto for history of DVT but has this large new DVT so he has been placed on IV heparin . His ankle x-ray did not reveal significant swelling or injury. Mild edema of the ischium may be a  budding decubitus.   Monitor for signs of infection.  Sepsis physiology resolved.  If no fever or white cell count by tomorrow discontinue antibiotics.   Non obstructing renal stones: Urology recommended monitoring.   Lower IVC DVT : The clot occurred while he was on Eliquis .  He was switched to Xarelto.   Coumadin is felt to be too high risk because of his history of intracranial hemorrhage.  Lovenox  may be better protection. The patient has been started on IV heparin  in the emergency department. 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 Hematology oncology was consulted.  Dr. Autumn recommended continuing Lovenox  and follow-up outpatient. Will inform TOC to arrange Lovenox .   DVT prophylaxis:  Lovenox  Code Status: Full code Family Communication: No family at bed side. Disposition Plan:    Status is: Inpatient Remains inpatient appropriate because: Severity of illness.  Patient likely be discharged back to facility on Monday.    Consultants:  Vascular surgery Oncology  Procedures: CT A/P  Antimicrobials:  Anti-infectives (From admission, onward)    Start     Dose/Rate Route Frequency Ordered Stop   07/11/24 1730  piperacillin -tazobactam (ZOSYN ) IVPB 3.375 g        3.375 g 12.5 mL/hr over 240 Minutes Intravenous Every 8 hours 07/11/24 1643     07/10/24 1415  piperacillin -tazobactam (ZOSYN )  IVPB 3.375 g        3.375 g 100 mL/hr over 30 Minutes Intravenous  Once 07/10/24 1407 07/10/24 1658   07/10/24 1415  vancomycin  (VANCOREADY) IVPB 1250 mg/250 mL        1,250 mg 166.7 mL/hr over 90 Minutes Intravenous  Once 07/10/24 1411 07/10/24 2013       Subjective: Patient was seen and examined at bedside. Overnight events noted. Patient reports feeling very weak and tired,  appears deconditioned,  He has contractures in the lower extremity.  RN denies overnight concerns.  Objective: Vitals:    07/13/24 2010 07/13/24 2341 07/14/24 0356 07/14/24 0804  BP: 109/70 113/71 107/73 114/67  Pulse: (!) 102 100 93 94  Resp: 16 14 15 14   Temp: 99.6 F (37.6 C) 98.1 F (36.7 C) 98.3 F (36.8 C) 98.3 F (36.8 C)  TempSrc: Oral Oral    SpO2: 97% 100% 99% 100%  Weight:      Height:        Intake/Output Summary (Last 24 hours) at 07/14/2024 1048 Last data filed at 07/14/2024 0414 Gross per 24 hour  Intake 480 ml  Output 950 ml  Net -470 ml   Filed Weights   07/10/24 1357  Weight: 59 kg    Examination:  General exam: Appears calm and comfortable, deconditioned, not in any acute distress. Respiratory system: CTA Bilaterally. Respiratory effort normal.  RR 12 Cardiovascular system: S1 & S2 heard, RRR. No JVD, murmurs, rubs, gallops or clicks.  Gastrointestinal system: Abdomen is non distended, soft and non tender. Normal bowel sounds heard. Central nervous system: Alert and oriented x 2. No focal neurological deficits. Extremities: Contractures in the lower extremities. Skin: No rashes, lesions or ulcers Psychiatry: Judgement and insight appear normal. Mood & affect appropriate.   Data Reviewed: I have personally reviewed following labs and imaging studies  CBC: Recent Labs  Lab 07/10/24 2007 07/11/24 0441 07/12/24 0026 07/13/24 0559  WBC 14.4* 12.7* 12.2* 8.4  NEUTROABS 11.7*  --   --   --   HGB 13.5 11.5* 11.6* 10.4*  HCT 42.3 34.7* 34.0* 30.8*  MCV 85.1 82.6 81.3 79.8*  PLT 449* 422* 423* 457*   Basic Metabolic Panel: Recent Labs  Lab 07/10/24 1539 07/11/24 0441 07/14/24 0505  NA 132* 134* 135  K 5.4* 3.6 2.8*  CL 98 101 99  CO2 22 21* 22  GLUCOSE 96 108* 92  BUN 16 11 <5*  CREATININE 1.11 0.79 0.85  CALCIUM  9.0 8.3* 8.1*  MG  --   --  1.8  PHOS  --   --  2.7   GFR: Estimated Creatinine Clearance: 77.1 mL/min (by C-G formula based on SCr of 0.85 mg/dL). Liver Function Tests: Recent Labs  Lab 07/10/24 1539  AST 52*  ALT 37  ALKPHOS 97  BILITOT  0.5  PROT 7.7  ALBUMIN 2.3*   No results for input(s): LIPASE, AMYLASE in the last 168 hours. No results for input(s): AMMONIA in the last 168 hours. Coagulation Profile: Recent Labs  Lab 07/10/24 1539  INR 2.2*   Cardiac Enzymes: No results for input(s): CKTOTAL, CKMB, CKMBINDEX, TROPONINI in the last 168 hours. BNP (last 3 results) No results for input(s): PROBNP in the last 8760 hours. HbA1C: No results for input(s): HGBA1C in the last 72 hours. CBG: No results for input(s): GLUCAP in the last 168 hours. Lipid Profile: No results for input(s): CHOL, HDL, LDLCALC, TRIG, CHOLHDL, LDLDIRECT in the last 72 hours. Thyroid  Function Tests:  No results for input(s): TSH, T4TOTAL, FREET4, T3FREE, THYROIDAB in the last 72 hours. Anemia Panel: No results for input(s): VITAMINB12, FOLATE, FERRITIN, TIBC, IRON, RETICCTPCT in the last 72 hours. Sepsis Labs: Recent Labs  Lab 07/10/24 1610 07/12/24 0832  LATICACIDVEN 2.5* 1.3    Recent Results (from the past 240 hours)  Blood Culture (routine x 2)     Status: None (Preliminary result)   Collection Time: 07/10/24  3:39 PM   Specimen: BLOOD LEFT FOREARM  Result Value Ref Range Status   Specimen Description BLOOD LEFT FOREARM  Final   Special Requests   Final    BOTTLES DRAWN AEROBIC AND ANAEROBIC Blood Culture results may not be optimal due to an inadequate volume of blood received in culture bottles   Culture   Final    NO GROWTH 4 DAYS Performed at [facility], [address], [place], [place] [address]    Report Status PENDING  Incomplete  Urine Culture     Status: None   Collection Time: 07/10/24  4:34 PM   Specimen: Urine, Clean Catch  Result Value Ref Range Status   Specimen Description URINE, CLEAN CATCH  Final   Special Requests NONE  Final   Culture   Final    NO GROWTH Performed at [facility], [address], [place], [place] [address]    Report  Status 07/12/2024 FINAL  Final    Radiology Studies: No results found.  Scheduled Meds:  enoxaparin  (LOVENOX ) injection  60 mg Subcutaneous Q12H   leptospermum manuka honey  1 Application Topical Daily   potassium chloride   40 mEq Oral Once   Continuous Infusions:  piperacillin -tazobactam (ZOSYN )  IV 3.375 g (07/14/24 9386)   potassium chloride        LOS: 4 days    Time spent: 35 mins    Darcel Dawley, MD [facility]   If 7PM-7AM, please contact night-coverage

## 2024-07-14 NOTE — Progress Notes (Signed)
 [facility] 7T81 [facility] Patient   Daniel Stuart is a current [facility] patient with a terminal diagnosis of cerebrovascular disease who resides at [facility]. Patient was recently hospitalized with treatment for pseudo ileus and since return to the facility has not been doing well. He was found to have low BP and high heart rate and family made choice for him to be evaluated. He has chosen to remain a full code. He was admitted on 9.9.25 with diagnosis of Sepsis and per Dr. Norleen Laurence this is a related hospital admission.    Visited patient bedside.  He was is alert and responsive. Seemed a bit confused when asked questions about meals. Stated he was waiting for sister Daniel Stuart to come and feed him. Denied any pain at this time. Spoke with bedside nurse caring for patient. She stated she would help to feed him. Stated that diet was changed to Mechanical soft due to patient pocketing food. Remains on contact precautions for MRSA.  Spoke with sister and she had no concerns at this time.  Patient is inpatient appropriate with need for labs/diagnostics and IVAB.   Vital Signs- 97.7/95/18   111/69   Sp02 100% on room air   I&O- 720/950   Abnormal Labs-  07/14/24 05:05 Basic metabolic panel with GFR: Rpt ! Potassium: 2.8 (L) BUN: <5 (L) Calcium : 8.1 (L)  Diagnostics-   None new    IV/PRN Meds- Zosyn  3.375g IV x2, Potassium Chloride  10mEq IV x2,    Current plan as per Darcel Dawley MD 9.13.25    Assessment & Plan:   Principal Problem:   Sepsis (HCC) Active Problems:   PVD (peripheral vascular disease) (HCC)   Bedbound   PAD (peripheral artery disease) (HCC)   Acute stroke due to ischemia (HCC)     Sepsis of unclear etiology : UA is unremarkable. He has a ureteral stone but it is not causing obstruction and UA is normal.   CT of the chest reveals no evidence of infection.   CT of abdomen pelvis reveals a nonocclusive DVT in the external iliac vein first  identified in 06/2024, but no evidence of infection. There is ill-defined edema over the left ischial tuberosity. Blood cultures  > No growth so far, urine cultures no growth. Initiated on empiric IV Zosyn . The patient was already on Xarelto for history of DVT but has this large new DVT so he has been placed on IV heparin . His ankle x-ray did not reveal significant swelling or injury. Mild edema of the ischium may be a budding decubitus.   Monitor for signs of infection.  Sepsis physiology resolved.  If no fever or white cell count by tomorrow discontinue antibiotics.   Non obstructing renal stones: Urology recommended monitoring.   Lower IVC DVT : The clot occurred while he was on Eliquis .  He was switched to Xarelto.   Coumadin is felt to be too high risk because of his history of intracranial hemorrhage.  Lovenox  may be better protection. The patient has been started on IV heparin  in the emergency department. 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 Hematology oncology was consulted.  Dr. Autumn recommended continuing Lovenox  and follow-up outpatient. Will inform TOC to arrange Lovenox .    Discharge Planning- ongoing, likely once medically optimized   Family Contact - spoke with Daniel Stuart via phone. No concerns at this time   IDT: updated   Goals of Care: Full  Thank you for the opportunity to participate in this patient's care, please don't hesitate to call for any hospice related questions or concerns.    When patient is ready for discharge please use [facility] as we contract this service for our patients.    Nat Babe, BSN, [facility] Liaison [phone]

## 2024-07-14 NOTE — Plan of Care (Signed)

## 2024-07-14 NOTE — Plan of Care (Signed)
 Patient's family is concerned that patient has swelling of the left face and patient is complaining about pain.  No evidence of infection. X-ray of the left wrist no evidence of soft tissue swelling or fracture.  IV access related soft tissue swelling.  Need to apply ice pack intermittently and the pain and swelling going to improve eventually over the course of 3 to 5 days.    Media Information   Document Information  Photos  Left wrist erythema  07/14/2024 19:40  Attached To:  Hospital Encounter on 07/10/24  Source Information  Jennine Kiki HERO, RN  [facility]

## 2024-07-15 DIAGNOSIS — A419 Sepsis, unspecified organism: Secondary | ICD-10-CM | POA: Diagnosis not present

## 2024-07-15 LAB — CULTURE, BLOOD (ROUTINE X 2): Culture: NO GROWTH

## 2024-07-15 NOTE — Progress Notes (Signed)
 PROGRESS NOTE    Daniel Stuart  FMW:[id number] DOB: Feb 25, 1966 DOA: 07/10/2024 PCP: Paseda, Folashade R, [name]   Brief Narrative:  This 58 yrs old Male nursing home resident with medical history significant for being bedbound due to previous stroke.  He has contractures in his lower extremities.  The patient was admitted a month ago with another new stroke.  He also has a history of intracranial hemorrhage so he is not a candidate for Coumadin.  He was on Eliquis  prior to this previous stroke and is now on Xarelto.  He is suspected to have antiphospholipid syndrome.  Patient was found to be less responsive and has decreased oral intake today.  He was found to be tachycardic in the ED, blood pressure noted well 108/78.  Sepsis is suspected so far no source has been found on his workup.  He does have a pressure ulcer on his lateral ankle of right foot.  He screams when his leg is moved.  Plain x-ray of ankle shows no abnormality.  Patient was admitted for sepsis due to unknown source.  Started on empiric antibiotics.  Assessment & Plan:   Principal Problem:   Sepsis (HCC) Active Problems:   PVD (peripheral vascular disease) (HCC)   Bedbound   PAD (peripheral artery disease) (HCC)   Acute stroke due to ischemia (HCC)   Sepsis of unclear etiology : UA is unremarkable. He has a ureteral stone but it is not causing obstruction and UA is normal.   CT of the chest reveals no evidence of infection.   CT of abdomen pelvis reveals a nonocclusive DVT in the external iliac vein first identified in 06/2024, but no evidence of infection. There is ill-defined edema over the left ischial tuberosity. Blood cultures  > No growth so far, urine cultures no growth. Initiated on empiric IV Zosyn . The patient was already on Xarelto for history of DVT but has this large new DVT so he has been placed on IV heparin . His ankle x-ray did not reveal significant swelling or injury. Mild edema of the ischium may be a  budding decubitus.   Monitor for signs of infection.  Sepsis physiology resolved.  He has no fever, no leukocyte's count, antibiotics discontinued yesterday.   Non obstructing renal stones: Urology recommended monitoring.   Lower IVC DVT : The clot occurred while he was on Eliquis .  He was switched to Xarelto.   Coumadin is felt to be too high risk because of his history of intracranial hemorrhage.  Lovenox  may be better protection. The patient has been started on IV heparin  in the emergency department. 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 Hematology oncology was consulted.  Dr. Autumn recommended continuing Lovenox  and follow-up outpatient. Will inform TOC to arrange Lovenox .   DVT prophylaxis:  Lovenox  Code Status: Full code Family Communication: No family at bed side. Disposition Plan:    Status is: Inpatient Remains inpatient appropriate because: Severity of illness.  Patient likely be discharged back to facility on Monday.    Consultants:  Vascular surgery Oncology  Procedures: CT A/P  Antimicrobials:  Anti-infectives (From admission, onward)    Start     Dose/Rate Route Frequency Ordered Stop   07/11/24 1730  piperacillin -tazobactam (ZOSYN ) IVPB 3.375 g  Status:  Discontinued        3.375 g 12.5 mL/hr over 240 Minutes Intravenous Every 8 hours 07/11/24 1643 07/14/24 1049   07/10/24 1415  piperacillin -tazobactam (ZOSYN ) IVPB 3.375 g        3.375 g 100 mL/hr over 30 Minutes Intravenous  Once 07/10/24 1407 07/10/24 1658   07/10/24 1415  vancomycin  (VANCOREADY) IVPB 1250 mg/250 mL        1,250 mg 166.7 mL/hr over 90 Minutes Intravenous  Once 07/10/24 1411 07/10/24 2013       Subjective: Patient was seen and examined at bedside. Overnight events noted. Patient reports feeling very weak and tired,  appears deconditioned,  He has contractures in the lower  extremity.  Objective: Vitals:   07/14/24 1913 07/14/24 2349 07/15/24 0342 07/15/24 0808  BP: 110/76 101/75 103/76 118/83  Pulse: 95 92 93 91  Resp: 15 14 16 17   Temp: 98.2 F (36.8 C) 98.6 F (37 C) 98.3 F (36.8 C) 98.1 F (36.7 C)  TempSrc:    Oral  SpO2: 100% 98% 98% 97%  Weight:      Height:        Intake/Output Summary (Last 24 hours) at 07/15/2024 1052 Last data filed at 07/15/2024 0610 Gross per 24 hour  Intake 700 ml  Output 250 ml  Net 450 ml   Filed Weights   07/10/24 1357  Weight: 59 kg    Examination:  General exam: Appears calm and comfortable, deconditioned, not in any acute distress. Respiratory system: CTA Bilaterally. Respiratory effort normal.  RR 14 Cardiovascular system: S1 & S2 heard, RRR. No JVD, murmurs, rubs, gallops or clicks.  Gastrointestinal system: Abdomen is non distended, soft and non tender. Normal bowel sounds heard. Central nervous system: Alert and oriented x 2. No focal neurological deficits. Extremities: Contractures in the lower extremities. Skin: No rashes, lesions or ulcers Psychiatry: Judgement and insight appear normal. Mood & affect appropriate.   Data Reviewed: I have personally reviewed following labs and imaging studies  CBC: Recent Labs  Lab 07/10/24 2007 07/11/24 0441 07/12/24 0026 07/13/24 0559  WBC 14.4* 12.7* 12.2* 8.4  NEUTROABS 11.7*  --   --   --   HGB 13.5 11.5* 11.6* 10.4*  HCT 42.3 34.7* 34.0* 30.8*  MCV 85.1 82.6 81.3 79.8*  PLT 449* 422* 423* 457*   Basic Metabolic Panel: Recent Labs  Lab 07/10/24 1539 07/11/24 0441 07/14/24 0505  NA 132* 134* 135  K 5.4* 3.6 2.8*  CL 98 101 99  CO2 22 21* 22  GLUCOSE 96 108* 92  BUN 16 11 <5*  CREATININE 1.11 0.79 0.85  CALCIUM  9.0 8.3* 8.1*  MG  --   --  1.8  PHOS  --   --  2.7   GFR: Estimated Creatinine Clearance: 77.1 mL/min (by C-G formula based on SCr of 0.85 mg/dL). Liver Function Tests: Recent Labs  Lab 07/10/24 1539  AST 52*  ALT 37   ALKPHOS 97  BILITOT 0.5  PROT 7.7  ALBUMIN 2.3*   No results for input(s): LIPASE, AMYLASE in the last 168 hours. No results for input(s): AMMONIA in the last 168 hours. Coagulation Profile: Recent Labs  Lab 07/10/24 1539  INR 2.2*   Cardiac Enzymes: No results for input(s): CKTOTAL, CKMB, CKMBINDEX, TROPONINI in the last 168 hours. BNP (last 3 results) No results for input(s): PROBNP in the last 8760 hours. HbA1C: No results for input(s): HGBA1C in the last 72 hours. CBG: No results for input(s): GLUCAP in the last 168 hours. Lipid Profile: No results for input(s): CHOL, HDL, LDLCALC, TRIG, CHOLHDL, LDLDIRECT in the last 72 hours. Thyroid  Function Tests: No results for input(s):  TSH, T4TOTAL, FREET4, T3FREE, THYROIDAB in the last 72 hours. Anemia Panel: No results for input(s): VITAMINB12, FOLATE, FERRITIN, TIBC, IRON, RETICCTPCT in the last 72 hours. Sepsis Labs: Recent Labs  Lab 07/10/24 1610 07/12/24 0832  LATICACIDVEN 2.5* 1.3    Recent Results (from the past 240 hours)  Blood Culture (routine x 2)     Status: None   Collection Time: 07/10/24  3:39 PM   Specimen: BLOOD LEFT FOREARM  Result Value Ref Range Status   Specimen Description BLOOD LEFT FOREARM  Final   Special Requests   Final    BOTTLES DRAWN AEROBIC AND ANAEROBIC Blood Culture results may not be optimal due to an inadequate volume of blood received in culture bottles   Culture   Final    NO GROWTH 5 DAYS Performed at [facility], [address], [place], [place] [address]    Report Status 07/15/2024 FINAL  Final  Urine Culture     Status: None   Collection Time: 07/10/24  4:34 PM   Specimen: Urine, Clean Catch  Result Value Ref Range Status   Specimen Description URINE, CLEAN CATCH  Final   Special Requests NONE  Final   Culture   Final    NO GROWTH Performed at [facility], [address] [place], [place] [address]     Report Status 07/12/2024 FINAL  Final    Radiology Studies: DG Hand 2 View Left Result Date: 07/14/2024 CLINICAL DATA:  Left hand pain and swelling EXAM: LEFT HAND - 2 VIEW COMPARISON:  None Available. FINDINGS: Frontal and lateral views of the left hand are obtained. Evaluation is limited by suboptimal positioning. There are no acute displaced fractures. Alignment is anatomic. Joint spaces are relatively well preserved. Soft tissues are unremarkable. No radiopaque foreign body. IMPRESSION: 1. Unremarkable left hand. Electronically Signed   By: Ozell Daring M.D.   On: 07/14/2024 19:57    Scheduled Meds:  enoxaparin  (LOVENOX ) injection  60 mg Subcutaneous Q12H   leptospermum manuka honey  1 Application Topical Daily   Continuous Infusions:     LOS: 5 days    Time spent: 35 mins    Darcel Dawley, MD [facility]   If 7PM-7AM, please contact night-coverage

## 2024-07-15 NOTE — Plan of Care (Signed)

## 2024-07-15 NOTE — Progress Notes (Signed)
 [facility] 7T81 [facility] Patient   [name] is a current [facility] patient with a terminal diagnosis of cerebrovascular disease who resides at [facility]. Patient was recently hospitalized with treatment for pseudo ileus and since return to the facility has not been doing well. He was found to have low BP and high heart rate and family made choice for him to be evaluated. He has chosen to remain a full code. He was admitted on 9.9.25 with diagnosis of Sepsis and per Dr. Norleen Laurence this is a related hospital admission.    Visited patient bedside.  He was resting and in an attempt to promote comfort I did not wake him. Spoke with sister via phone. She had concerns regarding feedings. I confirmed with nurse caring for patient that staff is assisting patient with feedings. Also discussed pain management with bedside nurse due to sister concerned about patient yelling in pain at the slightest touch. X-Ray of left hand taken on yesterday. No fracture or abnormality noted. No other concerns noted.    Patient is inpatient appropriate with need for monitoring following completion of IV antibiotics yesterday evening. Will likely discharge on tomorrow.   Vital Signs- 98.0/97/16   104/77   Sp02 100% on room air   I&O- 700/250   Abnormal Labs-  None new   Diagnostics-   Narrative & Impression CLINICAL DATA:  Left hand pain and swelling   EXAM: LEFT HAND - 2 VIEW   COMPARISON:  None Available.   FINDINGS: Frontal and lateral views of the left hand are obtained. Evaluation is limited by suboptimal positioning. There are no acute displaced fractures. Alignment is anatomic. Joint spaces are relatively well preserved. Soft tissues are unremarkable. No radiopaque foreign body.   IMPRESSION: 1. Unremarkable left hand.     Electronically Signed   By: Ozell Daring M.D.   On: 07/14/2024 19:57    IV/PRN Meds-  None   Current plan as per Darcel Dawley MD 9.14.25     Assessment & Plan:  Principal Problem:   Sepsis (HCC) Active Problems:   PVD (peripheral vascular disease) (HCC)   Bedbound   PAD (peripheral artery disease) (HCC)   Acute stroke due to ischemia (HCC)     Sepsis of unclear etiology : UA is unremarkable. He has a ureteral stone but it is not causing obstruction and UA is normal.   CT of the chest reveals no evidence of infection.   CT of abdomen pelvis reveals a nonocclusive DVT in the external iliac vein first identified in 06/2024, but no evidence of infection. There is ill-defined edema over the left ischial tuberosity. Blood cultures  > No growth so far, urine cultures no growth. Initiated on empiric IV Zosyn . The patient was already on Xarelto for history of DVT but has this large new DVT so he has been placed on IV heparin . His ankle x-ray did not reveal significant swelling or injury. Mild edema of the ischium may be a budding decubitus.   Monitor for signs of infection.  Sepsis physiology resolved.  He has no fever, no leukocyte's count, antibiotics discontinued yesterday.   Non obstructing renal stones: Urology recommended monitoring.   Lower IVC DVT : The clot occurred while he was on Eliquis .  He was switched to Xarelto.   Coumadin is felt to be too high risk because of his history of intracranial hemorrhage.  Lovenox  may be better protection. The patient has been started on IV heparin  in the emergency department. Vascular  surgery consulted , clot in the IVC is chronic,  does not need any acute intervention,  continue anticoagulation. Hematology oncology was consulted.  Dr. Autumn recommended continuing Lovenox  and follow-up outpatient. Will inform TOC to arrange Lovenox .   Discharge Planning- ongoing, likely will discharge back to facility on tomorrow   Family Contact - spoke with [name] via phone.   IDT: updated   Goals of Care: Full   Thank you for the opportunity to participate in this patient's care, please  don't hesitate to call for any hospice related questions or concerns.    When patient is ready for discharge please use [facility] as we contract this service for our patients.    Nat Babe, BSN, [facility] Liaison [phone]

## 2024-07-16 DIAGNOSIS — A419 Sepsis, unspecified organism: Secondary | ICD-10-CM | POA: Diagnosis not present

## 2024-07-16 LAB — BASIC METABOLIC PANEL WITH GFR
Anion gap: 17 — ABNORMAL HIGH (ref 5–15)
BUN: <5 mg/dL — ABNORMAL LOW (ref 6–20)
CO2: 20 mmol/L — ABNORMAL LOW (ref 22–32)
Calcium: 8.8 mg/dL — ABNORMAL LOW (ref 8.9–10.3)
Chloride: 102 mmol/L (ref 98–111)
Creatinine, Ser: 0.77 mg/dL (ref 0.61–1.24)
GFR, Estimated: >60 mL/min (ref >60)
Glucose, Bld: 98 mg/dL (ref 70–99)
Potassium: 2.9 mmol/L — ABNORMAL LOW (ref 3.5–5.1)
Sodium: 139 mmol/L (ref 135–145)

## 2024-07-16 LAB — CBC
HCT: 37.2 % — ABNORMAL LOW (ref 39.0–52.0)
Hemoglobin: 12.3 g/dL — ABNORMAL LOW (ref 13.0–17.0)
MCH: 27.1 pg (ref 26.0–34.0)
MCHC: 33.1 g/dL (ref 30.0–36.0)
MCV: 81.9 fL (ref 80.0–100.0)
Platelets: 531 K/uL — ABNORMAL HIGH (ref 150–400)
RBC: 4.54 MIL/uL (ref 4.22–5.81)
RDW: 13.8 % (ref 11.5–15.5)
WBC: 13 K/uL — ABNORMAL HIGH (ref 4.0–10.5)
nRBC: 0 % (ref 0.0–0.2)

## 2024-07-16 LAB — PHOSPHORUS: Phosphorus: 2.5 mg/dL (ref 2.5–4.6)

## 2024-07-16 LAB — MAGNESIUM: Magnesium: 1.9 mg/dL (ref 1.7–2.4)

## 2024-07-16 MED ORDER — ENOXAPARIN SODIUM 60 MG/0.6ML IJ SOSY: 60.0000 mg | PREFILLED_SYRINGE | Freq: Two times a day (BID) | INTRAMUSCULAR | 0 refills | Status: AC

## 2024-07-16 MED ORDER — POTASSIUM CHLORIDE 10 MEQ/100ML IV SOLN
10.0000 meq | INTRAVENOUS | Status: AC
  Administered 2024-07-16 (×3): 10 meq via INTRAVENOUS
  Filled 2024-07-16 (×3): qty 100

## 2024-07-16 MED ORDER — MEDIHONEY WOUND/BURN DRESSING EX PSTE: 1.0000 | PASTE | Freq: Every day | CUTANEOUS | 0 refills | Status: AC

## 2024-07-16 MED ORDER — POTASSIUM CHLORIDE 20 MEQ PO PACK
40.0000 meq | PACK | Freq: Once | ORAL | Status: AC
  Administered 2024-07-16: 40 meq via ORAL
  Filled 2024-07-16: qty 2

## 2024-07-16 NOTE — TOC Transition Note (Signed)
 Transition of Care [facility]) - Discharge Note   Patient Details  Name: Daniel Stuart MRN: [id number] Date of Birth: Dec 22, 1965  [facility] CM/SW Contact:  [name], LCSWA Phone Number: 07/16/2024, 2:50 PM   Clinical Narrative:     Patient will DC to: [facility] Anticipated DC date: 07/16/24  Family notified: [name] Transport by: [facility]   Per MD patient ready for DC to [facility]. RN to call report prior to discharge [phone], rm [phone]). RN, patient, patient's family, and facility notified of DC. Discharge Summary and FL2 sent to facility. DC packet on chart. Ambulance transport requested for patient.   CSW will sign off for now as social work intervention is no longer needed. Please consult us  again if new needs arise.    Final next level of care: Skilled Nursing Facility Barriers to Discharge: Barriers Resolved   Patient Goals and CMS Choice Patient states their goals for this hospitalization and ongoing recovery are:: more rest CMS Medicare.gov Compare Post Acute Care list provided to:: Patient Represenative (must comment) (pt's sister [name]) Choice offered to / list presented to : Sibling      Discharge Placement              Patient chooses bed at: [facility] Patient to be transferred to facility by: [facility] Name of family member notified: [name] Patient and family notified of of transfer: 07/16/24  Discharge Plan and Services Additional resources added to the After Visit Summary for   In-house Referral: Clinical Social Work   Post Acute Care Choice: Skilled Nursing Facility                               Social Drivers of Health (SDOH) Interventions SDOH Screenings   Food Insecurity: No Food Insecurity (07/11/2024)  Housing: Low Risk  (07/11/2024)  Transportation Needs: No Transportation Needs (07/11/2024)  Utilities: Not At Risk (07/11/2024)  Alcohol Screen: Low Risk  (07/31/2021)  Depression (PHQ2-9):  Low Risk  (04/13/2022)  Financial Resource Strain: Medium Risk (07/31/2021)  Physical Activity: Inactive (07/31/2021)  Social Connections: Socially Isolated (07/31/2021)  Stress: No Stress Concern Present (07/31/2021)  Tobacco Use: High Risk (06/19/2024)     Readmission Risk Interventions    06/21/2024   11:18 AM 04/16/2024   10:19 AM  Readmission Risk Prevention Plan  Transportation Screening Complete Complete  PCP or Specialist Appt within 5-7 Days  Complete  Home Care Screening  Complete  Medication Review (RN CM)  Complete  Palliative Care Screening Complete   Skilled Nursing Facility Complete

## 2024-07-16 NOTE — Discharge Summary (Signed)
 Physician Discharge Summary  Daniel Stuart FMW:[id number] DOB: [birth year] DOA: 07/10/2024  PCP: Paseda, Folashade R, [name]  Admit date: 07/10/2024  Discharge date: 07/16/2024  Admitted From:  [facility]  Disposition:  SNF  Recommendations for Outpatient Follow-up:  Follow up with PCP in 1-2 weeks. Please obtain BMP/CBC in one week. Advised to continue Lovenox  60 mg SQ every 12 hours for DVT. Advised to follow-up with Hematology Dr. Autumn as scheduled.   Home Health: None Equipment/Devices:None  Discharge Condition: Stable CODE STATUS:Full code Diet recommendation: Dysphagia III diet ( Thin liquids )  Brief Summary/ Hospital Course: This 58 yrs old Male nursing home resident with medical history significant for being bedbound due to previous stroke.  He has contractures in his lower extremities.  The patient was admitted a month ago with another new stroke.  He also has a history of intracranial hemorrhage so he is not a candidate for Coumadin. He was on Eliquis  prior to this previous stroke and is now on Xarelto.  He is suspected to have antiphospholipid syndrome.  Patient was found to be less responsive and has decreased oral intake today at SNF.  He was found to be tachycardic in the ED, blood pressure noted well 108/78.  Sepsis is suspected so far no source has been found on his workup.  He does have a pressure ulcer on his lateral ankle of right foot.  He screams when his leg is moved.  Plain x-ray of ankle shows no abnormality.  Patient was admitted for sepsis due to unknown source.  Patient was continued on IV antibiotics for 5 days.  No source has been found,  blood cultures and urine culture was negative.  Antibiotics were discontinued.  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  It was discussed with hematology Dr. Autumn who has recommended to discontinue Xarelto and  start patient on Lovenox  and follow-up outpatient in 2 to 4 weeks.  TOC notified, patient is returned back to his skilled nursing facility.  Discharge Diagnoses:  Principal Problem:   Sepsis (HCC) Active Problems:   PVD (peripheral vascular disease) (HCC)   Bedbound   PAD (peripheral artery disease) (HCC)   Acute stroke due to ischemia (HCC)  Sepsis of unclear etiology : UA is unremarkable. He has a ureteral stone but it is not causing obstruction and UA is normal.   CT of the chest reveals no evidence of infection.   CT of abdomen pelvis reveals a nonocclusive DVT in the external iliac vein first identified in 06/2024, but no evidence of infection. There is ill-defined edema over the left ischial tuberosity. Blood cultures  > No growth so far, urine cultures no growth. Completed IV Zosyn  for 5 days. The patient was already on Xarelto for history of DVT but has this large new DVT so he has been placed on IV heparin . His ankle x-ray did not reveal significant swelling or injury. Mild edema of the ischium may be a budding decubitus.   Monitor for signs of infection.  Sepsis physiology resolved.  Antibiotics discontinued after 5 days.   Non obstructing renal stones: Urology recommended monitoring.   Lower IVC DVT : The clot occurred while he was on Eliquis .  He was switched to Xarelto.   Coumadin is felt to be too high risk because of his history of intracranial hemorrhage.  Lovenox  may be better protection. The patient has been started  on IV heparin  in the emergency department. 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 Hematology oncology was consulted.  Dr. Autumn recommended continuing Lovenox  and follow-up outpatient. Will inform TOC to arrange Lovenox .  Discharge Instructions  Discharge Instructions     Call MD for:  difficulty breathing, headache or visual disturbances   Complete by: As directed     Call MD for:  persistant dizziness or light-headedness   Complete by: As directed    Call MD for:  persistant nausea and vomiting   Complete by: As directed    Diet - low sodium heart healthy   Complete by: As directed    Diet general   Complete by: As directed    Discharge instructions   Complete by: As directed    Advised to follow-up with primary care physician in 1 week. Advised to continue Lovenox  60 mg SQ every 12 hours for DVT. Advised to follow-up with hematology Dr. Autumn as scheduled.   Discharge wound care:   Complete by: As directed    Follow up Wound care as scheduled   Increase activity slowly   Complete by: As directed       Allergies as of 07/16/2024   No Known Allergies      Medication List     STOP taking these medications    apixaban  5 MG Tabs tablet Commonly known as: ELIQUIS        TAKE these medications    acetaminophen  325 MG tablet Commonly known as: TYLENOL  Take 2 tablets (650 mg total) by mouth every 6 (six) hours as needed for mild pain, moderate pain, fever or headache (or temp > 37.5 C (99.5 F)).   Acidophilus 100 MG Caps Take 200 mg by mouth in the morning.   bisacodyl  10 MG suppository Commonly known as: DULCOLAX Place 1 suppository (10 mg total) rectally daily as needed for mild constipation.   Cholecalciferol  25 MCG (1000 UT) tablet Take 1,000 Units by mouth every morning.   cyanocobalamin  1000 MCG tablet Take 1 tablet (1,000 mcg total) by mouth daily.   enoxaparin  60 MG/0.6ML injection Commonly known as: LOVENOX  Inject 0.6 mLs (60 mg total) into the skin every 12 (twelve) hours.   feeding supplement Liqd Take 237 mLs by mouth 2 (two) times daily between meals. What changed:  when to take this additional instructions   nutrition supplement (JUVEN) Pack Take 1 packet by mouth 2 (two) times daily between meals. What changed: Another medication with the same name was changed. Make sure you understand how and when to  take each.   FLEET SALINE ENEMA RE Place 1 application  rectally daily as needed. What changed: Another medication with the same name was removed. Continue taking this medication, and follow the directions you see here.   gabapentin  300 MG capsule Commonly known as: NEURONTIN  TAKE 1 CAPSULE BY MOUTH BY MOUTH TWO TIMES DAILY What changed:  how much to take how to take this when to take this   ipratropium-albuterol  0.5-2.5 (3) MG/3ML Soln Commonly known as: DUONEB Take 3 mLs by nebulization every 6 (six) hours as needed (SOB, wheezing).   ketoconazole 2 % shampoo Commonly known as: NIZORAL Apply 1 Application topically 2 (two) times a week. Monday and Thursday   leptospermum manuka honey Pste paste Apply 1 Application topically daily.   liver oil-zinc  oxide 40 % ointment Commonly known as: DESITIN Apply 1 Application topically as needed for  irritation (With each incontinent episode daily).   Milk of Magnesia 1200 MG/15ML suspension Generic drug: magnesium  hydroxide Take 30 mLs by mouth daily as needed for mild constipation.   ondansetron  4 MG tablet Commonly known as: ZOFRAN  Take 1 tablet (4 mg total) by mouth every 6 (six) hours as needed for nausea.   pantoprazole  40 MG tablet Commonly known as: PROTONIX  Take 1 tablet (40 mg total) by mouth at bedtime.   polyethylene glycol 17 g packet Commonly known as: MIRALAX  / GLYCOLAX  Take 17 g by mouth 2 (two) times daily.   potassium chloride  20 MEQ packet Commonly known as: KLOR-CON  Take 40 mEq by mouth 3 (three) times daily. What changed: when to take this   senna-docusate 8.6-50 MG tablet Commonly known as: Senokot-S Take 1 tablet by mouth at bedtime.   simethicone  80 MG chewable tablet Commonly known as: MYLICON Chew 1 tablet (80 mg total) by mouth 4 (four) times daily -  before meals and at bedtime. What changed: when to take this   sodium bicarbonate  650 MG tablet Take 650 mg by mouth 2 (two) times daily.                Discharge Care Instructions  (From admission, onward)           Start     Ordered   07/16/24 0000  Discharge wound care:       Comments: Follow up Wound care as scheduled   07/16/24 1043            Follow-up Information     Paseda, Folashade R, [name] Follow up in 1 week(s).   Specialty: Nurse Practitioner Contact information: [address] [place] [address] [phone]         Autumn Millman, MD Follow up in 1 week(s).   Specialty: Oncology Contact information: [address] [place] [address] [phone]                No Known Allergies  Consultations: Hematology Vascular surgery   Procedures/Studies: DG Hand 2 View Left Result Date: 07/14/2024 CLINICAL DATA:  Left hand pain and swelling EXAM: LEFT HAND - 2 VIEW COMPARISON:  None Available. FINDINGS: Frontal and lateral views of the left hand are obtained. Evaluation is limited by suboptimal positioning. There are no acute displaced fractures. Alignment is anatomic. Joint spaces are relatively well preserved. Soft tissues are unremarkable. No radiopaque foreign body. IMPRESSION: 1. Unremarkable left hand. Electronically Signed   By: Ozell Daring M.D.   On: 07/14/2024 19:57   DG Knee 2 Views Right Result Date: 07/10/2024 CLINICAL DATA:  Sepsis EXAM: RIGHT KNEE - 1-2 VIEW COMPARISON:  None Available. FINDINGS: Limited study with the knee imaged in a flexed position. Patient was unable to straighten leg. No visible fracture, subluxation or dislocation. No visible joint effusion. IMPRESSION: No visible acute bony abnormality. Electronically Signed   By: Franky Crease M.D.   On: 07/10/2024 20:12   DG Ankle 2 Views Right Result Date: 07/10/2024 CLINICAL DATA:  Sepsis EXAM: RIGHT ANKLE - 2 VIEW COMPARISON:  None Available. FINDINGS: There is no evidence of fracture, dislocation, or joint effusion. There is no evidence of arthropathy or other focal bone  abnormality. Soft tissues are unremarkable. IMPRESSION: Negative. Electronically Signed   By: Franky Crease M.D.   On: 07/10/2024 20:11   CT CHEST ABDOMEN PELVIS W CONTRAST Result Date: 07/10/2024 CLINICAL DATA:  Sepsis. EXAM: CT CHEST, ABDOMEN, AND PELVIS WITH CONTRAST  TECHNIQUE: Multidetector CT imaging of the chest, abdomen and pelvis was performed following the standard protocol during bolus administration of intravenous contrast. RADIATION DOSE REDUCTION: This exam was performed according to the departmental dose-optimization program which includes automated exposure control, adjustment of the mA and/or kV according to patient size and/or use of iterative reconstruction technique. CONTRAST:  75mL OMNIPAQUE  IOHEXOL  350 MG/ML SOLN COMPARISON:  CT abdomen pelvis dated 06/18/2024. FINDINGS: Evaluation of this exam is limited due to respiratory motion as well as streak artifact caused by patient's arms. CT CHEST FINDINGS Cardiovascular: There is no cardiomegaly or pericardial effusion. Mild atherosclerotic calcification of the thoracic aorta. No aneurysmal dilatation or dissection. The origins of the great vessels of the aortic arch and the central pulmonary arteries are patent. Mediastinum/Nodes: No hilar or mediastinal adenopathy. The esophagus and the thyroid  gland are grossly unremarkable. No mediastinal fluid collection. Lungs/Pleura: Background of emphysema and chronic interstitial coarsening. Bilateral linear atelectasis/scarring. No focal consolidation, pleural effusion or pneumothorax. The central airways are patent. Musculoskeletal: No acute osseous pathology CT ABDOMEN PELVIS FINDINGS No intra-abdominal free air or free fluid. Hepatobiliary: The liver is unremarkable. No biliary dilatation. The gallbladder is unremarkable. Pancreas: Unremarkable. No pancreatic ductal dilatation or surrounding inflammatory changes. Spleen: Normal in size without focal abnormality. Adrenals/Urinary Tract: The adrenal  glands unremarkable. Small nonobstructing bilateral renal calculi measure up to 5 mm. There is no hydronephrosis on either side. There is symmetric enhancement and excretion of contrast by both kidneys. There is a 5 mm stone in the proximal left ureter. The visualized right ureter and urinary bladder unremarkable. Stomach/Bowel: There is diffuse gaseous distension of the stomach. No mechanical obstruction. The small bowel demonstrates a normal caliber. The appendix is normal. Vascular/Lymphatic: Mild aortoiliac atherosclerotic disease. There is nonocclusive thrombus extending from the left external iliac vein into the lower IVC. No portal venous gas. There is no adenopathy. Reproductive: The prostate and seminal vesicles are grossly remarkable. Other: Faint area of ill-defined density in the soft tissues posterior to the left ischial tuberosity (coronal 27/6) may represent an area of edema. No drainable fluid collection at this time. Musculoskeletal: No acute osseous pathology. IMPRESSION: 1. No acute intrathoracic pathology. 2. Nonocclusive thrombus extending from the left external iliac vein into the lower IVC. 3. A 5 mm proximal left ureteral stone. No hydronephrosis. 4. Small nonobstructing bilateral renal calculi. 5. Faint area of ill-defined density in the soft tissues posterior to the left ischial tuberosity may represent an area of edema. No drainable fluid collection at this time. 6. Aortic Atherosclerosis (ICD10-I70.0) and Emphysema (ICD10-J43.9). These results were called by telephone at the time of interpretation on 07/10/2024 at 7:21 pm to provider BERNARDINO FIREMAN , who verbally acknowledged these results. Electronically Signed   By: Vanetta Chou M.D.   On: 07/10/2024 19:37   CT Head Wo Contrast Result Date: 07/10/2024 EXAM: CT HEAD WITHOUT CONTRAST 07/10/2024 06:58:07 PM TECHNIQUE: CT of the head was performed without the administration of intravenous contrast. Automated exposure control, iterative  reconstruction, and/or weight based adjustment of the mA/kV was utilized to reduce the radiation dose to as low as reasonably achievable. COMPARISON: 06/18/2024 CLINICAL HISTORY: Mental status change, unknown cause. FINDINGS: BRAIN AND VENTRICLES: No acute hemorrhage. No evidence of acute infarct. No hydrocephalus. No extra-axial collection. No mass effect or midline shift. Stable extensive chronic ischemic changes are seen throughout the bilateral subcortical and periventricular white matter. Similar bilateral parietal occipital and bifrontal encephalomalacia. ORBITS: No acute abnormality. SINUSES: No acute abnormality. SOFT TISSUES  AND SKULL: No acute soft tissue abnormality. No skull fracture. IMPRESSION: 1. No acute intracranial abnormality. 2. Stable extensive chronic ischemic changes throughout the bilateral subcortical and periventricular white matter, as well as bilateral parietal occipital and bifrontal encephalomalacia. Electronically signed by: Norman Gatlin MD 07/10/2024 07:23 PM EDT RP Workstation: [id number]   DG Chest Port 1 View Result Date: 07/10/2024 CLINICAL DATA:  Questionable sepsis. EXAM: PORTABLE CHEST 1 VIEW COMPARISON:  Chest radiograph dated 06/18/2024. FINDINGS: Bibasilar atelectasis/scarring. No focal consolidation, pleural effusion or pneumothorax. The cardiac silhouette is within normal limits. Loop recorder device. No acute osseous pathology. IMPRESSION: No active disease. Electronically Signed   By: Vanetta Chou M.D.   On: 07/10/2024 14:46   CT ANGIO HEAD NECK W WO CM Result Date: 06/19/2024 CLINICAL DATA:  Provided history: Stroke/TIA, determine embolic source. EXAM: CT ANGIOGRAPHY HEAD AND NECK WITH AND WITHOUT CONTRAST TECHNIQUE: Multidetector CT imaging of the head and neck was performed using the standard protocol during bolus administration of intravenous contrast. Multiplanar CT image reconstructions and MIPs were obtained to evaluate the vascular anatomy. Carotid  stenosis measurements (when applicable) are obtained utilizing NASCET criteria, using the distal internal carotid diameter as the denominator. RADIATION DOSE REDUCTION: This exam was performed according to the departmental dose-optimization program which includes automated exposure control, adjustment of the mA and/or kV according to patient size and/or use of iterative reconstruction technique. CONTRAST:  75mL OMNIPAQUE  IOHEXOL  350 MG/ML SOLN COMPARISON:  Brain MRI 06/18/2024. Head CT 06/18/2024. CT angiogram head/neck 06/19/2023. FINDINGS: CT HEAD FINDINGS Brain: Known acute cortical/subcortical infarct within the right frontal operculum and right insula (MCA vascular territory), not clearly changed in extent since yesterday's brain MRI. As before, there are extensive background chronic infarcts within the bilateral cerebral hemispheres and extensive background cerebral white matter disease. Tiny chronic infarcts within the bilateral cerebellar hemispheres which were better appreciated on yesterday's brain MRI. There is no acute intracranial hemorrhage. No extra-axial fluid collection. No evidence of an intracranial mass. No midline shift. Vascular: No hyperdense vessel.  A sclerotic calcifications. Skull: No calvarial fracture or aggressive osseous lesion. Sinuses/Orbits: No orbital mass or acute orbital finding. Minimal mucosal thickening within the bilateral maxillary sinuses. Review of the MIP images confirms the above findings CTA NECK FINDINGS Aortic arch: Common origin of the innominate and left common carotid arteries. The left vertebral artery arises directly from the aortic arch. Streak/beam hardening artifact arising from a dense contrast bolus partially obscures the left subclavian artery. Within this limitation, there is no appreciable hemodynamically significant innominate or proximal subclavian artery stenosis. Right carotid system: CCA and ICA patent within the neck without stenosis or significant  atherosclerotic disease. Left carotid system: CCA patent within the neck. As before, the ICA becomes occluded shortly beyond its origin and remains occluded throughout the remainder of the neck. Vertebral arteries: Codominant and patent within the neck without stenosis or significant atherosclerotic disease. Skeleton: Poor dentition. Dextrocurvature of the cervical spine. Nonspecific reversal of the expected cervical lordosis. Cervical spondylosis. Other neck: Subcentimeter left thyroid  lobe nodule not meeting consensus criteria for ultrasound follow-up based on size. Upper chest: No consolidation within the imaged lung apices. Emphysema. Review of the MIP images confirms the above findings CTA HEAD FINDINGS Anterior circulation: The intracranial left ICA remains occluded throughout the siphon region. As before, there is reconstitution of enhancement within the left ICA beginning at the paraclinoid level. The right ICA is patent intracranially. The M1 middle cerebral arteries are patent. No M2 proximal branch occlusion or  high-grade proximal stenosis. The anterior cerebral arteries are patent. Hypoplastic left A1 segment. No intracranial aneurysm is identified. Posterior circulation: The intracranial vertebral arteries are patent. The basilar artery is patent. The posterior cerebral arteries are patent. Posterior communicating arteries are present bilaterally. Venous sinuses: Assessment for dural venous sinus thrombosis is limited due to contrast timing. Anatomic variants: As described. Review of the MIP images confirms the above findings IMPRESSION: Non-contrast head CT: 1. Acute right MCA territory infarct, unchanged in extent as compared to yesterday's brain MRI. 2. Background advanced cerebral white matter disease and extensive chronic infarcts, as described. CTA neck: 1. Unchanged from the prior CTA of 06/19/2023, the left internal carotid artery becomes occluded shortly beyond its origin and remains occluded  throughout the remainder of the neck. 2. The right common carotid, right internal carotid and bilateral vertebral arteries are patent within the neck without stenosis or significant atherosclerotic disease. 3. Aortic Atherosclerosis (ICD10-I70.0) and Emphysema (ICD10-J43.9). CTA head: 1. Unchanged from the prior CTA of 06/19/2023, the intracranial left internal carotid artery remains occluded throughout the siphon region (with reconstitution of enhancement beginning at the paraclinoid level). 2. No proximal intracranial large vessel occlusion or high-grade proximal arterial stenosis identified elsewhere. Electronically Signed   By: Rockey Childs D.O.   On: 06/19/2024 16:04   MR Brain W and Wo Contrast Result Date: 06/18/2024 CLINICAL DATA:  Stroke, follow up patient reporting blurred vision, new onset - difficult to discern further history EXAM: MRI HEAD WITHOUT AND WITH CONTRAST TECHNIQUE: Multiplanar, multiecho pulse sequences of the brain and surrounding structures were obtained without and with intravenous contrast. CONTRAST:  6mL GADAVIST  GADOBUTROL  1 MMOL/ML IV SOLN COMPARISON:  MRI head 05/08/2024. FINDINGS: Brain: Acute right insular and overlying operculuminfarct involving both cortex and subcortical white matter with associated restricted diffusion. Associated edema without mass effect or midline shift. Bilateral parieto-occipital and bifrontal encephalomalacia. Evidence of prior hemorrhage in the right parietal and occipital region. Extensive confluent T2/FLAIR hyperintensity in the white matter. Vascular: Normal flow voids. Skull and upper cervical spine: Normal marrow signal. Sinuses/Orbits: Negative. IMPRESSION: 1. Acute right insular and overlying operculum infarct. No mass effect. 2. Multiple areas of encephalomalacia and extensive chronic microvascular ischemic disease. Electronically Signed   By: Gilmore GORMAN Molt M.D.   On: 06/18/2024 22:05   CT ABDOMEN PELVIS W CONTRAST Result Date:  06/18/2024 CLINICAL DATA:  Abdominal distension. EXAM: CT ABDOMEN AND PELVIS WITH CONTRAST TECHNIQUE: Multidetector CT imaging of the abdomen and pelvis was performed using the standard protocol following bolus administration of intravenous contrast. RADIATION DOSE REDUCTION: This exam was performed according to the departmental dose-optimization program which includes automated exposure control, adjustment of the mA and/or kV according to patient size and/or use of iterative reconstruction technique. CONTRAST:  75mL OMNIPAQUE  IOHEXOL  350 MG/ML SOLN COMPARISON:  CT chest abdomen and pelvis 04/13/2024. FINDINGS: Lower chest: Patchy ground-glass opacities are seen in both lung bases. Airspace disease in the right middle lobe has decreased, but has not completely resolved. Hepatobiliary: No focal liver abnormality is seen. No gallstones, gallbladder wall thickening, or biliary dilatation. Pancreas: Unremarkable. No pancreatic ductal dilatation or surrounding inflammatory changes. Spleen: Normal in size without focal abnormality. Adrenals/Urinary Tract: There is scarring in the inferior pole the right kidney. There are punctate bilateral renal calculi measuring up to 5 mm. These are unchanged. There is no hydronephrosis or perinephric fluid. The adrenal glands and bladder are within normal limits. Stomach/Bowel: The colon is diffusely dilated with air-fluid levels to the level the rectum  measuring up to 8.7 cm. There is no twisting or mesenteric edema identified. No colonic wall thickening identified. The appendix appears within normal limits. Small bowel and stomach are within normal limits. No pneumatosis or free air. Vascular/Lymphatic: Aorta and IVC are normal in size per there are findings concerning for nonocclusive thrombus within the inferior vena cava left common femoral vein, and left external iliac vein. No enlarged lymph nodes are identified. Reproductive: Prostate is unremarkable. Other: No abdominal wall  hernia or abnormality. No abdominopelvic ascites. Musculoskeletal: No fracture is seen. IMPRESSION: 1. Diffuse colonic dilatation with air-fluid levels to the level of the rectum. Findings are concerning for colonic ileus. No evidence for bowel obstruction. 2. Findings nonocclusive thrombus within the inferior vena cava, left common femoral vein, and left external iliac vein. 3. Nonobstructing bilateral renal calculi. 4. Patchy ground-glass opacities in both lung bases, likely infectious/inflammatory. Electronically Signed   By: Greig Pique M.D.   On: 06/18/2024 17:13   CT Head Wo Contrast Result Date: 06/18/2024 CLINICAL DATA:  Bilateral blurred vision for greater than 24 hours EXAM: CT HEAD WITHOUT CONTRAST TECHNIQUE: Contiguous axial images were obtained from the base of the skull through the vertex without intravenous contrast. RADIATION DOSE REDUCTION: This exam was performed according to the departmental dose-optimization program which includes automated exposure control, adjustment of the mA and/or kV according to patient size and/or use of iterative reconstruction technique. COMPARISON:  05/07/2024 FINDINGS: Brain: Stable diffuse cerebral atrophy. Stable chronic ischemic changes are again seen throughout the bilateral subcortical and periventricular white matter, with stable encephalomalacia in the right occipital region. No evidence of acute infarct or hemorrhage. The lateral ventricles and midline structures are unremarkable. No acute extra-axial fluid collections. No mass effect. Vascular: No hyperdense vessel or unexpected calcification. Skull: Normal. Negative for fracture or focal lesion. Sinuses/Orbits: No acute finding. Other: None. IMPRESSION: 1. No acute intracranial process. 2. Stable cortical atrophy, encephalomalacia, and chronic ischemic changes as above. Electronically Signed   By: Ozell Daring M.D.   On: 06/18/2024 17:11   DG Chest Portable 1 View Result Date: 06/18/2024 CLINICAL  DATA:  Visual changes EXAM: PORTABLE CHEST 1 VIEW COMPARISON:  02/26/2024 FINDINGS: Single frontal view of the chest demonstrates a stable cardiac silhouette. Stable bibasilar scarring. No airspace disease, effusion, or pneumothorax. No acute bony abnormalities. IMPRESSION: 1. No acute intrathoracic process. Electronically Signed   By: Ozell Daring M.D.   On: 06/18/2024 16:16   Subjective: Patient was seen and examined at bedside.  Overnight events noted. Patient reports feeling better.  Patient remains afebrile. Patient is being discharged to skilled nursing facility for rehab.  Discharge Exam: Vitals:   07/16/24 0456 07/16/24 0828  BP: 111/74 122/83  Pulse: 97 (!) 114  Resp: 18 16  Temp: 98.4 F (36.9 C) 98.3 F (36.8 C)  SpO2: 100% 100%   Vitals:   07/15/24 2006 07/16/24 0010 07/16/24 0456 07/16/24 0828  BP: 125/82 132/80 111/74 122/83  Pulse: 89 94 97 (!) 114  Resp: 18 18 18 16   Temp: 98.2 F (36.8 C) 98.2 F (36.8 C) 98.4 F (36.9 C) 98.3 F (36.8 C)  TempSrc:    Oral  SpO2: 100% 100% 100% 100%  Weight:      Height:        General: Pt is alert, awake, not in acute distress Cardiovascular: RRR, S1/S2 +, no rubs, no gallops Respiratory: CTA bilaterally, no wheezing, no rhonchi Abdominal: Soft, NT, ND, bowel sounds + Extremities: no edema, no cyanosis,  contracted lower extremities.    The results of significant diagnostics from this hospitalization (including imaging, microbiology, ancillary and laboratory) are listed below for reference.     Microbiology: Recent Results (from the past 240 hours)  Blood Culture (routine x 2)     Status: None   Collection Time: 07/10/24  3:39 PM   Specimen: BLOOD LEFT FOREARM  Result Value Ref Range Status   Specimen Description BLOOD LEFT FOREARM  Final   Special Requests   Final    BOTTLES DRAWN AEROBIC AND ANAEROBIC Blood Culture results may not be optimal due to an inadequate volume of blood received in culture bottles    Culture   Final    NO GROWTH 5 DAYS Performed at [facility], [address], [place], [place] [address]    Report Status 07/15/2024 FINAL  Final  Urine Culture     Status: None   Collection Time: 07/10/24  4:34 PM   Specimen: Urine, Clean Catch  Result Value Ref Range Status   Specimen Description URINE, CLEAN CATCH  Final   Special Requests NONE  Final   Culture   Final    NO GROWTH Performed at [facility], [address], [place], [place] [address]    Report Status 07/12/2024 FINAL  Final     Labs: BNP (last 3 results) No results for input(s): BNP in the last 8760 hours. Basic Metabolic Panel: Recent Labs  Lab 07/10/24 1539 07/11/24 0441 07/14/24 0505 07/16/24 0831  NA 132* 134* 135 139  K 5.4* 3.6 2.8* 2.9*  CL 98 101 99 102  CO2 22 21* 22 20*  GLUCOSE 96 108* 92 98  BUN 16 11 <5* <5*  CREATININE 1.11 0.79 0.85 0.77  CALCIUM  9.0 8.3* 8.1* 8.8*  MG  --   --  1.8 1.9  PHOS  --   --  2.7 2.5   Liver Function Tests: Recent Labs  Lab 07/10/24 1539  AST 52*  ALT 37  ALKPHOS 97  BILITOT 0.5  PROT 7.7  ALBUMIN 2.3*   No results for input(s): LIPASE, AMYLASE in the last 168 hours. No results for input(s): AMMONIA in the last 168 hours. CBC: Recent Labs  Lab 07/10/24 2007 07/11/24 0441 07/12/24 0026 07/13/24 0559 07/16/24 0831  WBC 14.4* 12.7* 12.2* 8.4 13.0*  NEUTROABS 11.7*  --   --   --   --   HGB 13.5 11.5* 11.6* 10.4* 12.3*  HCT 42.3 34.7* 34.0* 30.8* 37.2*  MCV 85.1 82.6 81.3 79.8* 81.9  PLT 449* 422* 423* 457* 531*   Cardiac Enzymes: No results for input(s): CKTOTAL, CKMB, CKMBINDEX, TROPONINI in the last 168 hours. BNP: Invalid input(s): POCBNP CBG: No results for input(s): GLUCAP in the last 168 hours. D-Dimer No results for input(s): DDIMER in the last 72 hours. Hgb A1c No results for input(s): HGBA1C in the last 72 hours. Lipid Profile No results for input(s): CHOL, HDL, LDLCALC, TRIG,  CHOLHDL, LDLDIRECT in the last 72 hours. Thyroid  function studies No results for input(s): TSH, T4TOTAL, T3FREE, THYROIDAB in the last 72 hours.  Invalid input(s): FREET3 Anemia work up No results for input(s): VITAMINB12, FOLATE, FERRITIN, TIBC, IRON, RETICCTPCT in the last 72 hours. Urinalysis    Component Value Date/Time   COLORURINE YELLOW 07/10/2024 1407   APPEARANCEUR CLEAR 07/10/2024 1407   LABSPEC 1.011 07/10/2024 1407   PHURINE 5.0 07/10/2024 1407   GLUCOSEU NEGATIVE 07/10/2024 1407   HGBUR NEGATIVE 07/10/2024 1407  BILIRUBINUR NEGATIVE 07/10/2024 1407   BILIRUBINUR negative 07/10/2021 1202   BILIRUBINUR neg 06/11/2020 0901   KETONESUR NEGATIVE 07/10/2024 1407   PROTEINUR NEGATIVE 07/10/2024 1407   UROBILINOGEN 0.2 07/10/2021 1202   UROBILINOGEN 0.2 11/30/2017 1030   NITRITE NEGATIVE 07/10/2024 1407   LEUKOCYTESUR NEGATIVE 07/10/2024 1407   Sepsis Labs Recent Labs  Lab 07/11/24 0441 07/12/24 0026 07/13/24 0559 07/16/24 0831  WBC 12.7* 12.2* 8.4 13.0*   Microbiology Recent Results (from the past 240 hours)  Blood Culture (routine x 2)     Status: None   Collection Time: 07/10/24  3:39 PM   Specimen: BLOOD LEFT FOREARM  Result Value Ref Range Status   Specimen Description BLOOD LEFT FOREARM  Final   Special Requests   Final    BOTTLES DRAWN AEROBIC AND ANAEROBIC Blood Culture results may not be optimal due to an inadequate volume of blood received in culture bottles   Culture   Final    NO GROWTH 5 DAYS Performed at [facility], [address] [place], [place] [address]    Report Status 07/15/2024 FINAL  Final  Urine Culture     Status: None   Collection Time: 07/10/24  4:34 PM   Specimen: Urine, Clean Catch  Result Value Ref Range Status   Specimen Description URINE, CLEAN CATCH  Final   Special Requests NONE  Final   Culture   Final    NO GROWTH Performed at [facility], [address], [place], [place]  [address]    Report Status 07/12/2024 FINAL  Final     Time coordinating discharge: Over 30 minutes  SIGNED:   Darcel Dawley, MD  [facility] 07/16/2024, 12:26 PM Pager   If 7PM-7AM, please contact night-coverage

## 2024-07-16 NOTE — Discharge Instructions (Signed)
 Advised to follow-up with primary care physician in 1 week. Advised to continue Lovenox  60 mg SQ every 12 hours for DVT. Advised to follow-up with hematology Dr. Autumn as scheduled.

## 2024-07-16 NOTE — Evaluation (Signed)
 Clinical/Bedside Swallow Evaluation Patient Details  Name: Daniel Stuart MRN: [id number] Date of Birth: [birth year]  Today's Date: 07/16/2024 Time: SLP Start Time (ACUTE ONLY): 1020 SLP Stop Time (ACUTE ONLY): 1041 SLP Time Calculation (min) (ACUTE ONLY): 21 min  Past Medical History:  Past Medical History:  Diagnosis Date   Anxiety    Arrhythmia as indication for cardiac pacemaker replacement 03/2020   Frequent falls 03/2020   History of loop recorder 04/2017   ICH (intracerebral hemorrhage) (HCC) 06/19/2023   Stroke (cerebrum) (HCC)    Tobacco use    Urinary frequency 09/2019   Vitamin D  deficiency    Past Surgical History:  Past Surgical History:  Procedure Laterality Date   FOOT SURGERY     LOOP RECORDER INSERTION N/A 04/13/2017   Procedure: Loop Recorder Insertion;  Surgeon: Waddell Danelle ORN, MD;  Location: [facility] INVASIVE CV LAB;  Service: Cardiovascular;  Laterality: N/A;   TEE WITHOUT CARDIOVERSION N/A 04/13/2017   Procedure: TRANSESOPHAGEAL ECHOCARDIOGRAM (TEE);  Surgeon: Jeffrie Oneil BROCKS, MD;  Location: [facility] ENDOSCOPY;  Service: Cardiovascular;  Laterality: N/A;   HPI:  58 y.o. male admitted with sepsis.  Follow[facility] by [facility] at [facility]. PMHx: R CVA 06/2024, intracranial hemorrhage, bedbound at baseline, chronic ileus, poor appetite. BSE 06/19/24: cognitive based oral dysphagia with impulsivity - dysphagia 2,thin liquids was recommended.    Assessment / Plan / Recommendation  Clinical Impression  Pt presents with normal oropharyngeal swallow.  He needs set-up and assistance with self-feeding due to poor initiation. He fed himself regular solids, purees, and thin liquids with adequate oral attention, thorough mastication, and no s/s of aspiration with sequential swallows. No dysphagia noted during exam.  Recommend advancing diet to regular solids, thin liquids. Give meds in liquid - if he coughs, give whole in puree. No SLP f/u is warranted. D/W RN. SLP Visit  Diagnosis: Dysphagia, unspecified (R13.10)    Aspiration Risk    unknown   Diet Recommendation   Thin;Age appropriate regular  Medication Administration: Whole meds with liquid      Swallow Study   General HPI: 58 y.o. male admitted with sepsis.  Followed by [facility] at [facility]. PMHx: R CVA 06/2024, intracranial hemorrhage, bedbound at baseline, chronic ileus, poor appetite. BSE 06/19/24: cognitive based oral dysphagia with impulsivity - dysphagia 2,thin liquids was recommended. Type of Study: Bedside Swallow Evaluation Previous Swallow Assessment: see HPI Diet Prior to this Study: Dysphagia 3 (mechanical soft);Thin liquids (Level 0) Temperature Spikes Noted: No Respiratory Status: Room air History of Recent Intubation: No Behavior/Cognition: Alert;Cooperative;Pleasant mood;Requires cueing Oral Cavity Assessment: Within Functional Limits Oral Care Completed by SLP: No Oral Cavity - Dentition: Adequate natural dentition Vision: Functional for self-feeding Self-Feeding Abilities: Needs assist;Needs set up Patient Positioning: Upright in bed Baseline Vocal Quality: Normal Volitional Cough: Strong Volitional Swallow: Able to elicit    Oral/Motor/Sensory Function Overall Oral Motor/Sensory Function: Within functional limits   Ice Chips Ice chips: Within functional limits   Thin Liquid Thin Liquid: Within functional limits    Nectar Thick Nectar Thick Liquid: Not tested   Honey Thick Honey Thick Liquid: Not tested   Puree Puree: Within functional limits   Solid     Solid: Within functional limits      Vona Palma Laurice 07/16/2024,10:46 AM  Palma L. Vona, MA CCC/SLP Clinical Specialist - Acute Care SLP Acute Rehabilitation Services Office number [phone]

## 2024-07-16 NOTE — Care Management Important Message (Signed)
 Important Message  Patient Details  Name: Daniel Stuart MRN: [id number] Date of Birth: June 24, 1966   Important Message Given:  Yes - Medicare IM     Claretta Deed 07/16/2024, 3:48 PM

## 2024-07-16 NOTE — NC FL2 (Signed)
 [place]  MEDICAID FL2 LEVEL OF CARE FORM     IDENTIFICATION  Patient Name: Daniel Stuart Birthdate: 11/18/1965 Sex: male Admission Date (Current Location): 07/10/2024  [facility] and [place] Number:  Producer, television/film/video and Address:  [facility], [address], [place], [place] [address]      Provider Number: [id number]  Attending Physician Name and Address:  Leotis Bogus, MD  Relative Name and Phone Number:  Daniel Stuart: [phone]    Current Level of Care: Hospital Recommended Level of Care: Skilled Nursing Facility Prior Approval Number:    Date Approved/Denied:   PASRR Number: [id number]  Discharge Plan: SNF    Current Diagnoses: Patient Active Problem List   Diagnosis Date Noted   Sepsis (HCC) 07/10/2024   Acute stroke due to ischemia (HCC) 06/19/2024   Acute DVT (deep venous thrombosis) (HCC) 05/08/2024   airspace disease 04/13/2024   Prolonged QT interval 04/13/2024   Ileus (HCC) 03/05/2024   Edema of right upper arm 02/28/2024   Protein calorie malnutrition (HCC) 02/27/2024   Pressure injury of skin 02/24/2024   Colonic pseudoobstruction 02/23/2024   Colitis 02/23/2024   Hypokalemia 02/23/2024   History of intracranial hemorrhage 02/23/2024   Essential hypertension 02/23/2024   Antiphospholipid syndrome (HCC) 02/23/2024   Left carotid artery occlusion 09/09/2022   Weakness of both lower extremities 09/07/2022   Bedbound 08/25/2022   PAD (peripheral artery disease) (HCC) 08/25/2022   Hyperlipidemia 05/03/2022   Arrhythmia as indication for cardiac pacemaker replacement 03/11/2020   Vitamin D  deficiency 09/13/2019   History of intracranial hemorrhage History of severe encephalopathy History of CVA/TIA/progressive leukoencephalopathy and chronic left ICA stenosis 03/12/2019   Right sided weakness 03/12/2019   Anxiety 03/12/2019   PVD (peripheral vascular disease) (HCC) 04/12/2017   Carotid arterial disease (HCC)  04/12/2017    Orientation RESPIRATION BLADDER Height & Weight     Self  Normal Incontinent (External catheter) Weight: 130 lb 1.1 oz (59 kg) Height:  5' 6 (167.6 cm)  BEHAVIORAL SYMPTOMS/MOOD NEUROLOGICAL BOWEL NUTRITION STATUS      Incontinent Diet (please refer to dc summary)  AMBULATORY STATUS COMMUNICATION OF NEEDS Skin   Total Care Verbally Other (Comment) (wound left ankle anterior stage 2; wound right buttocks stage 2)                       Personal Care Assistance Level of Assistance  Bathing, Feeding, Dressing Bathing Assistance: Maximum assistance Feeding assistance: Maximum assistance Dressing Assistance: Maximum assistance     Functional Limitations Info  Sight, Hearing, Speech Sight Info: Adequate Hearing Info: Adequate Speech Info: Adequate    SPECIAL CARE FACTORS FREQUENCY                       Contractures Contractures Info: Not present    Additional Factors Info  Code Status, Allergies Code Status Info: FULL Allergies Info: No known allergies Psychotropic Info: gabapentin  (NEURONTIN ) capsule 300 mg 2 times daily         Current Medications (07/16/2024):  This is the current hospital active medication list Current Facility-Administered Medications  Medication Dose Route Frequency Provider Last Rate Last Admin   acetaminophen  (TYLENOL ) tablet 650 mg  650 mg Oral Q6H PRN Arthea Child, MD   650 mg at 07/15/24 1810   Or   acetaminophen  (TYLENOL ) suppository 650 mg  650 mg Rectal Q6H PRN Arthea Child, MD       bisacodyl  (DULCOLAX) EC  tablet 5 mg  5 mg Oral Daily PRN Claiborne, Claudia, MD       enoxaparin  (LOVENOX ) injection 60 mg  60 mg Subcutaneous Q12H Bari Delon BIRCH, RPH   60 mg at 07/16/24 0518   leptospermum manuka honey (MEDIHONEY) paste 1 Application  1 Application Topical Daily Leotis Bogus, MD   1 Application at 07/15/24 1000   potassium chloride  (KLOR-CON ) packet 40 mEq  40 mEq Oral Once Leotis Bogus, MD        potassium chloride  10 mEq in 100 mL IVPB  10 mEq Intravenous Q1 Hr x 3 Leotis Bogus, MD         Discharge Medications: Please see discharge summary for a list of discharge medications.  Relevant Imaging Results:  Relevant Lab Results:   Additional Information [id number]  Daniel Stuart, LCSWA

## 2024-08-14 ENCOUNTER — Encounter: Payer: Self-pay | Admitting: Neurology

## 2024-08-14 ENCOUNTER — Ambulatory Visit (INDEPENDENT_AMBULATORY_CARE_PROVIDER_SITE_OTHER): Admitting: Neurology

## 2024-08-14 VITALS — BP 104/76 | HR 64

## 2024-08-14 DIAGNOSIS — G8110 Spastic hemiplegia affecting unspecified side: Secondary | ICD-10-CM | POA: Diagnosis not present

## 2024-08-14 DIAGNOSIS — D6859 Other primary thrombophilia: Secondary | ICD-10-CM | POA: Diagnosis not present

## 2024-08-14 DIAGNOSIS — I63411 Cerebral infarction due to embolism of right middle cerebral artery: Secondary | ICD-10-CM | POA: Diagnosis not present

## 2024-08-14 DIAGNOSIS — D6861 Antiphospholipid syndrome: Secondary | ICD-10-CM

## 2024-08-14 NOTE — Patient Instructions (Addendum)
 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  He is clearly at high risk for bleeding in the future and needs to stay off antiplatelet and anticoagulants long-term but however he has had a recent DVT requiring anticoagulation and despite being on Eliquis  now has had a embolic stroke as well.  We are really out of treatment options at this point.  Patient's quality of life is quite poor and sister who is health power of attorney has agreed to DNR and wishes to pursue comfort care measures only.  I will communicate this to the nursing home barriers to make him full comfort care measures.  Palliative care team has already been consulted at the nursing home..     Follow-up in the future only as necessary.

## 2024-08-14 NOTE — Progress Notes (Signed)
 [facility] [address] [place]. [place] [address] [phone]       OFFICE FOLLOW-UP NOTE  [name] Date of Birth:  Oct 04, 1966 Medical Record Number:  [id number]   HPI:   Update 08/02/2023 : He returns for follow-up after last visit 5 months ago.  He was referred back following recent admission in August 2024 for intracerebral hemorrhage.  He was admitted on 06/19/2023 from a rehab facility for sudden onset of loss of vision which she noticed upon awakening in the morning.  He could see shapes and lights.  Unable to count fingers and make out details of any objects.  CT scan of the head showed a 30 cubic cc right parieto-occipital parenchymal hemorrhage with some intraventricular extension with no significant hydrocephalus.  Patient was on Eliquis  which was reversed with Andexxa .  Blood pressure was tightly controlled.  CT angiogram showed no large vessel stenosis or occlusion on spot sign.  ICH score was 2.  NIH stroke scale was 5.  His neurological exam initially suggested cortical blindness with he was able to detect some motion.  Repeat imaging showed stable appearance of the right parieto-occipital hemorrhage and MRI scan also showed stable appearance but extensive cerebral microhemorrhages and hemosiderin deposits and white matter changes raising concern for cerebral amyloid angiopathy.  2D echo showed ejection fraction of 60 to 65%.  No left ventricular hypertrophy.  LDL cholesterol 46 mg percent.  Hemoglobin A1c was 6.1.  His antiplatelet and Eliquis  were discontinued. office visit 03/16/2023 Mr Daniel Stuart is a 39 year african American male seen today for office f/u visit after hospital consult for stroke in November 2023.  History is obtained from the patient and review of electronic medical records and I personally reviewed pertinent available imaging films in PACS.  He has past medical history of hyperlipidemia, peripheral vascular disease, chronic left carotid  occlusion, tobacco abuse, multifocal strokes of cryptogenic 47 2018 status post loop recorder placement.  He was admitted in 09/09/2022 with subacute progressive decline in his gait and cognition with increasing falls.  MRI scan of the brain cervical thoracic and lumbar spine were obtained which showed normal significant abnormalities except mild multifocal punctate acute bilateral cortical and subcortical infarcts.  There is also significant extensive progressive bilateral supratentorial white matter disease and associated atrophy of corpus callosum and cortex along with changes of superficial siderosis raising concerns for territory small vessel disease etiologies like MELAS,CADASIL, CNS vasculitis or other small vessel disease.  CT angiogram showed chronic occlusion of left internal carotid artery at the bulb with reconstitution in the ophthalmic segment no other large vessel disease.  Carotid ultrasound was unremarkable.  Transcranial Doppler study was negative for right-to-left shunt.  Echocardiogram showed ejection fraction of 55 to 60% without cardiac source of embolism.  Patient had a loop recorder inserted following his previous stroke in 2018 and paroxysmal A-fib was not found despite more than 3 years of monitoring.  Hypercoagulable panel labs were significant only for mildly elevated IgM anticardiolipin antibody of 25 and IgM glycoprotein of 47 which were felt to be acute phase reactants.  IgM Lyme antibodies were also detected but patient denies any history of tick bite, rash, having pets with ticks or  living in a wooded area.  There is no family history of strokes at a young age or migraines.  Patient has no prior history of migraines either.  Labs for vasculitis was negative.  Vitamin D  levels are slightly low and he was started on  replacement.  Vitamin B12 and TSH were normal.  Hepatitis C antibody was negative.  Vasculitic labs were negative. Patient is currently in skilled nursing facility.  He is  getting daily physical occupational and speech therapy.  He however has not noticed improvement in his gait and balance.  He can get up with support and stand but is unable to walk he feels his legs give out and his balance is poor.  He states his speech is improved but his short-term memory and cognitive abilities remain poor.  He denies feeling depressed but on depression scale he tested positive for depression and mental status exam today scored 15/30. Update 08/02/2023 : Patient is seen for follow-up today after last visit 5 months ago.  He was hospitalized in August 2024 with sudden onset of vision difficulties.  He was found to have a 30 cc acute hematoma in the right parieto-occipital region with small volume intraventricular extension and subarachnoid extension.  CT angiogram showed absent flow in the left ICA which was chronic.  Follow-up CT scan and MRI showed stable appearance of the right parieto-occipital hematoma.  There were extensive cerebral microbleed hemorrhages and hemosiderin deposits noted raising concern for cerebral amyloid angiopathy versus  CADASIL.  Echocardiogram showed ejection fraction of 60 to 65%.  No LVH.  LDL cholesterol 46 mg percent.  Hemoglobin A1c was 6.1.  Patient had been on aspirin  and Eliquis  prior to admission and these were discontinued.  He was transferred to skilled nursing facility for ongoing therapy needs.  Patient states he is getting physical Occupational Therapy but is still not able to walk even with assistance.SABRA  He can barely stand with help with the therapist.  He states his memory and cognitive deficits are unchanged.  Still has significant peripheral vision loss which has not improved.  He is not on any antiplatelet therapy or anticoagulation at this time.  There is no family accompanying him today.  In November 2023 patient's neurological exam had noted difficulty counting fingers on the right more than the left with normal size reactive pupils raising  concern for possible cortical blindness.  On today's exam and also patient is inconsistent with visual field testing as he cannot keep his eyes steady but clearly can detect motion and likely sees better in the central field of vision in less towards periphery on either side.  Patient had also tested positive for anticardiolipin.  IgM and phosphatidylserine IgM antibodies in November 2023.  Repeat lab work in January 2024 was also positive for IgM and phosphatidylserine hence was started on Eliquis  in July 2024 along with aspirin  and Plavix  which were subsequently stopped 1 week prior to his admission for intracerebral hemorrhage and at that time he was only on Eliquis .  Patient is currently in a nursing home.  He is not ambulating.  He requires total care.  He states he cannot get up with his 1 person assist with physical therapy but needs to hold onto somebody to take a few steps.  He feels his vision loss has persisted. Update 08/14/2024 : He returns for follow-up after last visit with me a year ago.  He is accompanied by his sister who is his health power of attorney.  Patient unfortunately has had significant cognitive and physical decline in the last 1 year.  He was admitted in July 2025 with extensive deep vein thrombosis and extension up to IVC requiring anticoagulation with initially with IV heparin  and then transition to Eliquis  which he remains on.  He was also admitted in August 2025 with a new right MCA frontal branch infarct despite anticoagulation on Eliquis .  CT angiogram showed unchanged appearance of the occluded left ICA throughout the siphon region.  Echocardiogram showed ejection fraction of 65 to 70%.  LDL cholesterol 68 mg percent.  Hemoglobin A1c was 5.6.  He had a follow-up CT head on 07/10/2024 which showed multiple old infarcts but no acute finding.  He is now in a nursing home requiring 24-hour care and wheelchair-bound.  He is unable to get up and walk.  His spastic left hemiparesis as  well as now right leg weakness with contractures.  He is able to speak in a slow hesitant voice uses his right purposefully.  Family has put him on hospice care and made him DNR. ROS:   14 system review of systems is positive for weakness, difficulty walking, imbalance, memory loss and all other systems negative PMH:  Past Medical History:  Diagnosis Date   Anxiety    Arrhythmia as indication for cardiac pacemaker replacement 03/2020   Frequent falls 03/2020   History of loop recorder 04/2017   ICH (intracerebral hemorrhage) (HCC) 06/19/2023   Stroke (cerebrum) (HCC)    Tobacco use    Urinary frequency 09/2019   Vitamin D  deficiency     Social History:  Social History   Socioeconomic History   Marital status: Legally Separated    Spouse name: Not on file   Number of children: 1   Years of education: Not on file   Highest education level: Some college, no degree  Occupational History   Occupation: disabled  Tobacco Use   Smoking status: Every Day    Current packs/day: 1.00    Average packs/day: 1 pack/day for 46.8 years (46.8 ttl pk-yrs)    Types: Cigarettes    Start date: 11/01/1977   Smokeless tobacco: Never  Vaping Use   Vaping status: Never Used  Substance and Sexual Activity   Alcohol use: No   Drug use: No   Sexual activity: Not Currently    Partners: Female  Other Topics Concern   Not on file  Social History Narrative   Patient is right-handed. He lives in a one level home alone  since. He drinks one cup of coffee and 2-3 sodas a day.   Social Drivers of Health   Financial Resource Strain: Medium Risk (07/31/2021)   Overall Financial Resource Strain (CARDIA)    Difficulty of Paying Living Expenses: Somewhat hard  Food Insecurity: No Food Insecurity (07/11/2024)   Hunger Vital Sign    Worried About Running Out of Food in the Last Year: Never true    Ran Out of Food in the Last Year: Never true  Transportation Needs: No Transportation Needs (07/11/2024)    PRAPARE - Administrator, Civil Service (Medical): No    Lack of Transportation (Non-Medical): No  Physical Activity: Inactive (07/31/2021)   Exercise Vital Sign    Days of Exercise per Week: 0 days    Minutes of Exercise per Session: 0 min  Stress: No Stress Concern Present (07/31/2021)   [employer] of Occupational Health - Occupational Stress Questionnaire    Feeling of Stress : Not at all  Social Connections: Socially Isolated (07/31/2021)   Social Connection and Isolation Panel    Frequency of Communication with Friends and Family: Three times a week    Frequency of Social Gatherings with Friends and Family: Never    Attends Religious Services: Never  Active Member of Clubs or Organizations: No    Attends Banker Meetings: Never    Marital Status: Separated  Intimate Partner Violence: Unknown (07/11/2024)   Humiliation, Afraid, Rape, and Kick questionnaire    Fear of Current or Ex-Partner: No    Emotionally Abused: No    Physically Abused: Not on file    Sexually Abused: No    Medications:   Current Outpatient Medications on File Prior to Visit  Medication Sig Dispense Refill   acetaminophen  (TYLENOL ) 325 MG tablet Take 2 tablets (650 mg total) by mouth every 6 (six) hours as needed for mild pain, moderate pain, fever or headache (or temp > 37.5 C (99.5 F)).     bisacodyl  (DULCOLAX) 10 MG suppository Place 1 suppository (10 mg total) rectally daily as needed for mild constipation.     Cholecalciferol  25 MCG (1000 UT) tablet Take 1,000 Units by mouth every morning.     cyanocobalamin  1000 MCG tablet Take 1 tablet (1,000 mcg total) by mouth daily.     enoxaparin  (LOVENOX ) 60 MG/0.6ML injection Inject 0.6 mLs (60 mg total) into the skin every 12 (twelve) hours. 60 mL 0   feeding supplement (ENSURE ENLIVE / ENSURE PLUS) LIQD Take 237 mLs by mouth 2 (two) times daily between meals. (Patient taking differently: Take 237 mLs by mouth 3 (three) times daily  between meals. Chocolate if available) 237 mL 12   gabapentin  (NEURONTIN ) 300 MG capsule TAKE 1 CAPSULE BY MOUTH BY MOUTH TWO TIMES DAILY (Patient taking differently: Take 300 mg by mouth 2 (two) times daily.) 180 capsule 3   ipratropium-albuterol  (DUONEB) 0.5-2.5 (3) MG/3ML SOLN Take 3 mLs by nebulization every 6 (six) hours as needed (SOB, wheezing).     ketoconazole (NIZORAL) 2 % shampoo Apply 1 Application topically 2 (two) times a week. Monday and Thursday     Lactobacillus (ACIDOPHILUS) 100 MG CAPS Take 200 mg by mouth in the morning.     leptospermum manuka honey (MEDIHONEY) PSTE paste Apply 1 Application topically daily. 15 mL 0   liver oil-zinc  oxide (DESITIN) 40 % ointment Apply 1 Application topically as needed for irritation (With each incontinent episode daily).     magnesium  hydroxide (MILK OF MAGNESIA) 400 MG/5ML suspension Take 30 mLs by mouth daily as needed for mild constipation.     nutrition supplement, JUVEN, (JUVEN) PACK Take 1 packet by mouth 2 (two) times daily between meals.     ondansetron  (ZOFRAN ) 4 MG tablet Take 1 tablet (4 mg total) by mouth every 6 (six) hours as needed for nausea.     pantoprazole  (PROTONIX ) 40 MG tablet Take 1 tablet (40 mg total) by mouth at bedtime.     polyethylene glycol (MIRALAX  / GLYCOLAX ) 17 g packet Take 17 g by mouth 2 (two) times daily.     potassium chloride  (KLOR-CON ) 20 MEQ packet Take 40 mEq by mouth 3 (three) times daily. (Patient taking differently: Take 40 mEq by mouth every 8 (eight) hours.)     senna-docusate (SENOKOT-S) 8.6-50 MG tablet Take 1 tablet by mouth at bedtime.     simethicone  (MYLICON) 80 MG chewable tablet Chew 1 tablet (80 mg total) by mouth 4 (four) times daily -  before meals and at bedtime. (Patient taking differently: Chew 80 mg by mouth 4 (four) times daily.) 30 tablet 0   sodium bicarbonate  650 MG tablet Take 650 mg by mouth 2 (two) times daily.     Sodium Phosphates  (FLEET SALINE ENEMA RE) Place  1 application   rectally daily as needed.     No current facility-administered medications on file prior to visit.    Allergies:  No Known Allergies  Physical Exam General: Frail middle-aged African-American male, seated, in no evident distress Head: head normocephalic and atraumatic.  Neck: supple with no carotid or supraclavicular bruits Cardiovascular: regular rate and rhythm, no murmurs Musculoskeletal: no deformity Skin:  no rash/petichiae Vascular:  Normal pulses all extremities Vitals:   08/14/24 1009  BP: 104/76  Pulse: 64   Neurologic Exam Mental Status: Awake and fully alert.  Speech is slow and hesitant can speak only few words and short sentences.  Follows simple midline and one-step commands only.  Difficulty with naming and repetition.  Mini-Mental status exam not done today. Cranial Nerves: Fundoscopic exam reveals sharp disc margins. Pupils equal, briskly white matter to light. Extraocular movements full without nystagmus but has slow saccades bilaterally.. Visual fields poor cooperation but suspect diminished peripheral vision bilaterally left more than right and he cannot keep his eyes steady to check peripheral vision and he can detect motion may not identify fingers accurately.SABRA  Hearing intact. Facial sensation intact.  Mild right lower facial asymmetry when he smiles.  Tongue, palate moves normally and symmetrically.  Brisk jaw jerk. Motor: Spastic quadriparesis now with right upper extremity strength 3/5.  Left upper extremity 1/5.  Bilateral lower extremities 1/5 with contractures and pain on passive movements.  Tone is increased bilaterally but more on the right than the left.   Sensory.: intact to touch ,pinprick .position and vibratory sensation.  Coordination: Unable to test.   Gait and Station: Deferred as patient is now wheelchair and unable to walk even with assistance..  Reflexes: 3+ and asymmetric and brisker on the right.  No clonus.. Toes downgoing.    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  He has significant underlying vascular dementia and depression and gait difficulties.  Recent admission in August 2024 with right parieto-occipital intracerebral hemorrhage in the setting of severe leukoencephalopathy and antiplatelet and anticoagulant usage.  Admission in July 2025 due to extensive deep vein thrombosis extending up to IVC requiring restarting anticoagulation on Eliquis .  Recurrent right MCA frontal branch infarct in August 2025 with significant left hemiplegia again failing Eliquis .  Patient is extremely debilitated with quadriparesis only having some purposeful movement in the right upper extremity and wheelchair-bound     PLAN:I had a long discussion with the patient and his sister regarding his recent embolic stroe, deep vein thrombosis, intracerebral hemorrhage which was likely a combination of being on antiplatelet agents as well as anticoagulation and having underlying severe white matter changes from his progressive leukoencephalopathy which may be cerebral amyloid angiopathy along with anticardiolipin antibody syndrome.  He is clearly at high risk for bleeding  in the future and needs to stay off antiplatelet and anticoagulants long-term but  however he has had a recent DVT requiring anticoagulation and despite being on Eliquis  now has had a embolic stroke as well.  We are really out of treatment options at this point.  Patient's quality of life is quite poor and sister who is health power of attorney has agreed to DNR and wishes to pursue comfort care measures only.  I will communicate this to the nursing home barriers to make him full comfort care measures.  Palliative care team has already been consulted at the nursing home..  Had a long discussion with the patient and his sister about his goals of care and poor quality of life and answered questions.     Follow-up in the future only as necessary.   I personally spent a total of in the care of the patient today including getting/reviewing separately obtained history, performing a medically appropriate exam/evaluation, counseling and educating, placing orders, referring and communicating with other health care professionals, documenting clinical information in the EHR, independently interpreting results, and coordinating care.        Eather Popp, MD Note: This document was prepared with digital dictation and possible smart phrase technology. Any transcriptional errors that result from this process are unintentional

## 2024-12-02 DEATH — deceased
